# Patient Record
Sex: Male | Born: 1950 | Race: Black or African American | Hispanic: No | Marital: Single | Smoking: Never smoker
Health system: Southern US, Community
[De-identification: ages and names within clinical notes are randomized; demographics above are authoritative.]

## PROBLEM LIST (undated history)

## (undated) DIAGNOSIS — N400 Enlarged prostate without lower urinary tract symptoms: Secondary | ICD-10-CM

## (undated) DIAGNOSIS — K279 Peptic ulcer, site unspecified, unspecified as acute or chronic, without hemorrhage or perforation: Secondary | ICD-10-CM

## (undated) DIAGNOSIS — E11621 Type 2 diabetes mellitus with foot ulcer: Secondary | ICD-10-CM

## (undated) DIAGNOSIS — G459 Transient cerebral ischemic attack, unspecified: Secondary | ICD-10-CM

## (undated) DIAGNOSIS — E1169 Type 2 diabetes mellitus with other specified complication: Secondary | ICD-10-CM

## (undated) DIAGNOSIS — E2740 Unspecified adrenocortical insufficiency: Secondary | ICD-10-CM

## (undated) DIAGNOSIS — Z789 Other specified health status: Secondary | ICD-10-CM

## (undated) DIAGNOSIS — B9562 Methicillin resistant Staphylococcus aureus infection as the cause of diseases classified elsewhere: Secondary | ICD-10-CM

## (undated) DIAGNOSIS — I82409 Acute embolism and thrombosis of unspecified deep veins of unspecified lower extremity: Secondary | ICD-10-CM

## (undated) DIAGNOSIS — D509 Iron deficiency anemia, unspecified: Secondary | ICD-10-CM

## (undated) DIAGNOSIS — M549 Dorsalgia, unspecified: Secondary | ICD-10-CM

## (undated) DIAGNOSIS — M869 Osteomyelitis, unspecified: Secondary | ICD-10-CM

## (undated) DIAGNOSIS — L97509 Non-pressure chronic ulcer of other part of unspecified foot with unspecified severity: Secondary | ICD-10-CM

## (undated) DIAGNOSIS — Z89611 Acquired absence of right leg above knee: Secondary | ICD-10-CM

## (undated) DIAGNOSIS — G8929 Other chronic pain: Secondary | ICD-10-CM

## (undated) HISTORY — PX: BACK SURGERY: SHX140

---

## 2002-10-03 ENCOUNTER — Inpatient Hospital Stay (HOSPITAL_COMMUNITY): Admission: EM | Admit: 2002-10-03 | Discharge: 2002-10-05 | Payer: Self-pay | Admitting: Emergency Medicine

## 2002-10-05 ENCOUNTER — Encounter: Payer: Self-pay | Admitting: Internal Medicine

## 2005-03-03 ENCOUNTER — Emergency Department (HOSPITAL_COMMUNITY): Admission: EM | Admit: 2005-03-03 | Discharge: 2005-03-03 | Payer: Self-pay | Admitting: Emergency Medicine

## 2006-11-29 ENCOUNTER — Emergency Department (HOSPITAL_COMMUNITY): Admission: EM | Admit: 2006-11-29 | Discharge: 2006-11-29 | Payer: Self-pay | Admitting: Emergency Medicine

## 2006-11-30 ENCOUNTER — Emergency Department (HOSPITAL_COMMUNITY): Admission: EM | Admit: 2006-11-30 | Discharge: 2006-11-30 | Payer: Self-pay | Admitting: Emergency Medicine

## 2010-11-04 NOTE — Discharge Summary (Signed)
NAME:  Jeff Ferrell, JUNKER NO.:  [id number]   MEDICAL RECORD NO.:  [id number]                   PATIENT TYPE:  INP   LOCATION:  4704                                 FACILITY:  [facility]   PHYSICIAN:  Eliseo Gum, M.D.                DATE OF BIRTH:  Jul 18, 1950   DATE OF ADMISSION:  10/03/2002  DATE OF DISCHARGE:  10/05/2002                                 DISCHARGE SUMMARY   DISCHARGE DIAGNOSES:  1. Chest pain believed to be secondary to cocaine-induced vasospasm.  2. Alcohol abuse.  3. Cocaine abuse.   PAST MEDICAL HISTORY:  1. Reattachment of left finger.  2. Diabetes mellitus diagnosed eight years ago at [facility].   DISCHARGE MEDICATIONS:  1. Aspirin 81 mg q.d.  2. Multivitamin p.o. q.d.   CONSULTATIONS:  Cardiology.   PROCEDURE:  Cardiolite on October 05, 2002.   FOLLOW UP:  The patient will follow up in outpatient clinic.  At that time,  any new chest pain should be assessed and may be helpful to have a baseline  EKG at that time for future records.   HISTORY OF PRESENT ILLNESS:  This is a 60 year old, African-American male  with diabetes and alcohol abuse who presents to [facility] with left-sided  chest pain that started three days prior to admission.  Pain is 4/10 and  without radiation.  Initially there were no associated symptoms, however, on  day of arrival, the patient had palpitations, shortness of breath, nausea  and vomiting which continued on arrival to the hospital.  The patient also  had numbness of left hand and neck and his symptoms were relieved with  medications given in the emergency department.  However, he said that the  left-sided chest pain did not subside.  The pain was also in the left  shoulder.   ALLERGIES:  PENICILLIN.   MEDICATIONS:  None.   SOCIAL HISTORY:  The patient says that he has never smoked.  He says that he  drinks about three quarts of beer a day.  He denied cocaine and IV drug use,  although his  urine drug screen was positive for cocaine.  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  He  currently lives with a girlfriend here in [place].   FAMILY HISTORY:  Significant for mother age 51 with hypertension and father  age 66 with diabetes mellitus.  He has three siblings, one of which has  diabetes mellitus and two children which are healthy.   PHYSICAL EXAMINATION:  VITAL SIGNS:  Pulse 110, blood pressure 116/78,  temperature 97.8, respirations 20, O2 saturations 96% on 2 L.  GENERAL:  Well-developed, well-nourished, African-American male in no acute  distress.  HEENT:  Pupils equal round and reactive to light.  Oropharynx was clear.  Mucosa was moist.  NECK:  No JVD.  LUNGS:  Clear  to auscultation bilaterally.  CARDIAC:  Regular rate and rhythm without appreciable murmur, gallop or rub.  Pain on palpation over the left anterior chest.  ABDOMEN:  Belly was soft, nondistended.  There was no abdominal tenderness  to palpation.  Positive normoactive bowel wounds.  EXTREMITIES:  No lower extremity edema.   LABORATORY DATA AND X-RAY FINDINGS:  Sodium 137, potassium 4.2, chloride  105, bicarb 24, BUN 12, creatinine 0.7, glucose 119.  First set of cardiac  enzymes showed CK of 437 MB 10.6 and troponin of 0.01.  White count 4.6,  hemoglobin 16.2, platelets 260.   EKG showed flattening of T waves with ST changes.  No Q waves and normal  intervals.  The chest x-ray was normal.   HOSPITAL COURSE:  Problem 1.  CHEST PAIN:  As noted, the patient adamantly  denied use of cocaine or any other drugs.  Cardiac enzymes were checked x3  with results being CK 437, 300 and 233; MB fraction 10.6, 7.9 and 5.0; three  troponins of 0.01, 0.01 and less than 0.01.  D-dimer was also checked which  was less than 0.22.  For risk stratification, liver profile was checked  which showed an HDL of 77 and LDL of 44.  We did check patient's urine for  drugs,  although he denied any use and he was positive for cocaine.  We did  call cardiology, especially given his risk factor of diabetes mellitus, and  they did a treadmill Cardiolite which was negative.  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  They thought that the AV block was probably secondary to cocaine use  and beta-blocker.  The beta-blocker was stopped and the patient's AV block  resolved as well.  The patient was discharged home with instructions to  follow up in the outpatient clinic.  He was given aspirin on discharge.   Problem 2.  SUBSTANCE ABUSE INCLUDING ALCOHOL AND COCAINE:  The patient was  given thiamine and folate.  He was also advised of his alcohol abuse and his  cocaine abuse.  This will also something that the patient will need to  follow as an outpatient.   DISCHARGE LABORATORY DATA AND X-RAY FINDINGS:  White count 3.2, hemoglobin  14, platelets 178, MCV 89.2.  Sodium 134, potassium 3.5, chloride 103, CO2  25, glucose 163, BUN 9, creatinine 0.9, calcium 8.1.  AST 58, ALT 43, total  bilirubin 2, Alk phos 66.                                                Eliseo Gum, M.D.    KC/MEDQ  D:  10/22/2002  T:  10/23/2002  Job:  [id number]

## 2010-11-24 ENCOUNTER — Emergency Department (HOSPITAL_COMMUNITY): Payer: Self-pay

## 2010-11-24 ENCOUNTER — Emergency Department (HOSPITAL_COMMUNITY)
Admission: EM | Admit: 2010-11-24 | Discharge: 2010-11-24 | Disposition: A | Discharge status: Home / Self Care | Payer: Self-pay | Attending: Emergency Medicine | Admitting: Emergency Medicine

## 2010-11-24 DIAGNOSIS — M549 Dorsalgia, unspecified: Secondary | ICD-10-CM | POA: Insufficient documentation

## 2010-11-24 DIAGNOSIS — M545 Low back pain, unspecified: Secondary | ICD-10-CM | POA: Insufficient documentation

## 2010-11-24 DIAGNOSIS — L732 Hidradenitis suppurativa: Secondary | ICD-10-CM | POA: Insufficient documentation

## 2010-11-24 DIAGNOSIS — R000 Tachycardia, unspecified: Secondary | ICD-10-CM | POA: Insufficient documentation

## 2010-11-24 DIAGNOSIS — IMO0002 Reserved for concepts with insufficient information to code with codable children: Secondary | ICD-10-CM | POA: Insufficient documentation

## 2010-11-24 LAB — DIFFERENTIAL
Eosinophils Absolute: 0.1 10*3/uL (ref 0.0–0.7)
Lymphs Abs: 1.3 10*3/uL (ref 0.7–4.0)
Monocytes Absolute: 0.9 10*3/uL (ref 0.1–1.0)
Monocytes Relative: 8 % (ref 3–12)
Neutrophils Relative %: 81 % — ABNORMAL HIGH (ref 43–77)

## 2010-11-24 LAB — BASIC METABOLIC PANEL
GFR calc Af Amer: >60 mL/min (ref >60)
GFR calc non Af Amer: >60 mL/min (ref >60)
Glucose, Bld: 216 mg/dL — ABNORMAL HIGH (ref 70–99)
Potassium: 3.5 mEq/L (ref 3.5–5.1)
Sodium: 128 mEq/L — ABNORMAL LOW (ref 135–145)

## 2010-11-24 LAB — URINALYSIS, ROUTINE W REFLEX MICROSCOPIC
Bilirubin Urine: NEGATIVE
Nitrite: NEGATIVE
Specific Gravity, Urine: 1.015 (ref 1.005–1.030)
Urobilinogen, UA: 1 mg/dL (ref 0.0–1.0)
pH: 6 (ref 5.0–8.0)

## 2010-11-24 LAB — CBC
MCH: 29 pg (ref 26.0–34.0)
MCV: 85.8 fL (ref 78.0–100.0)
RBC: 5 MIL/uL (ref 4.22–5.81)
RDW: 12.8 % (ref 11.5–15.5)

## 2011-04-06 LAB — URINALYSIS, ROUTINE W REFLEX MICROSCOPIC
Glucose, UA: NEGATIVE
Hgb urine dipstick: NEGATIVE
Ketones, ur: NEGATIVE
Nitrite: POSITIVE — AB
Nitrite: POSITIVE — AB
Protein, ur: NEGATIVE
Specific Gravity, Urine: 1.01
Specific Gravity, Urine: 1.022
Urobilinogen, UA: 1
pH: 7.5

## 2011-04-06 LAB — I-STAT 8, (EC8 V) (CONVERTED LAB)
Acid-base deficit: 1
BUN: 5 — ABNORMAL LOW
Bicarbonate: 26.5 — ABNORMAL HIGH
Chloride: 108
HCT: 49
Operator id: 146091
pCO2, Ven: 51.5 — ABNORMAL HIGH
pH, Ven: 7.32 — ABNORMAL HIGH

## 2011-04-06 LAB — URINE MICROSCOPIC-ADD ON

## 2011-04-06 LAB — CBC
HCT: 42.2
MCHC: 33.9
MCHC: 34.2
MCV: 89.2
MCV: 89.2
RBC: 4.73
RDW: 12.9
WBC: 17.5 — ABNORMAL HIGH

## 2011-04-06 LAB — DIFFERENTIAL
Basophils Absolute: 0
Basophils Absolute: 0
Basophils Relative: 1
Eosinophils Absolute: 0.1
Lymphocytes Relative: 1 — ABNORMAL LOW
Lymphs Abs: 0.2 — ABNORMAL LOW
Neutro Abs: 17.1 — ABNORMAL HIGH
Neutro Abs: 2.3
Neutrophils Relative %: 58
Neutrophils Relative %: 98 — ABNORMAL HIGH

## 2011-04-06 LAB — COMPREHENSIVE METABOLIC PANEL
BUN: 5 — ABNORMAL LOW
CO2: 24
Calcium: 8.8
Chloride: 103
Creatinine, Ser: 0.83
GFR calc Af Amer: >60
GFR calc non Af Amer: >60
Total Bilirubin: 1.6 — ABNORMAL HIGH

## 2011-04-06 LAB — POCT I-STAT CREATININE: Creatinine, Ser: 0.8

## 2011-04-06 LAB — URINE CULTURE: Colony Count: NO GROWTH

## 2011-04-06 LAB — LIPASE, BLOOD: Lipase: 27

## 2011-05-01 ENCOUNTER — Emergency Department (HOSPITAL_COMMUNITY): Payer: Non-veteran care

## 2011-05-01 ENCOUNTER — Encounter: Payer: Self-pay | Admitting: *Deleted

## 2011-05-01 ENCOUNTER — Emergency Department (HOSPITAL_COMMUNITY)
Admission: EM | Admit: 2011-05-01 | Discharge: 2011-05-01 | Disposition: A | Discharge status: Home / Self Care | Payer: Non-veteran care | Attending: Emergency Medicine | Admitting: Emergency Medicine

## 2011-05-01 DIAGNOSIS — S39012A Strain of muscle, fascia and tendon of lower back, initial encounter: Secondary | ICD-10-CM

## 2011-05-01 DIAGNOSIS — X500XXA Overexertion from strenuous movement or load, initial encounter: Secondary | ICD-10-CM | POA: Insufficient documentation

## 2011-05-01 DIAGNOSIS — M5380 Other specified dorsopathies, site unspecified: Secondary | ICD-10-CM | POA: Insufficient documentation

## 2011-05-01 DIAGNOSIS — S335XXA Sprain of ligaments of lumbar spine, initial encounter: Secondary | ICD-10-CM | POA: Insufficient documentation

## 2011-05-01 DIAGNOSIS — Z981 Arthrodesis status: Secondary | ICD-10-CM | POA: Insufficient documentation

## 2011-05-01 MED ORDER — HYDROCODONE-ACETAMINOPHEN 5-325 MG PO TABS: 1.0000 | ORAL_TABLET | Freq: Four times a day (QID) | ORAL | Status: AC | PRN

## 2011-05-01 MED ORDER — OXYCODONE-ACETAMINOPHEN 5-325 MG PO TABS
1.0000 | ORAL_TABLET | Freq: Once | ORAL | Status: AC
  Administered 2011-05-01: 1 via ORAL
  Filled 2011-05-01: qty 1

## 2011-05-01 MED ORDER — CYCLOBENZAPRINE HCL 5 MG PO TABS: 5.0000 mg | ORAL_TABLET | Freq: Three times a day (TID) | ORAL | Status: AC | PRN

## 2011-05-01 MED ORDER — NAPROXEN 500 MG PO TABS: 500.0000 mg | ORAL_TABLET | Freq: Two times a day (BID) | ORAL | Status: AC

## 2011-05-01 NOTE — ED Notes (Signed)
Patient stable and being discharged home with gf.  Patient states understanding of paperwork.

## 2011-05-01 NOTE — ED Provider Notes (Signed)
History     CSN: [id number] Arrival date & time: 05/01/2011  2:50 PM   First MD Initiated Contact with Patient 05/01/11 1825      Chief Complaint  Patient presents with  . Tailbone Pain    (Consider location/radiation/quality/duration/timing/severity/associated sxs/prior treatment) HPI The patient states he slipped in the shower on Sunday evening. Patient states he did not actually fall but he twisted. He has history of chronic back pain and prior surgery. Patient states since that time he's been having a lot of pain. It does not get better with resting or movement. Patient states the pain is sharp and increases with certain positions. Patient states he's worried that he is a screw loose on the inside. Patient states at times the pain will go to his right leg. He denies any focal numbness or weakness. Is not any bowel or bladder incontinence. Past Medical History  Diagnosis Date  . Diabetes mellitus     regulated by diet    Past Surgical History  Procedure Date  . Back surgery     screws & hardware to lower back    No family history on file.  History  Substance Use Topics  . Smoking status: Never Smoker   . Smokeless tobacco: Not on file  . Alcohol Use: 3.6 oz/week    6 Cans of beer per week     daily      Review of Systems  All other systems reviewed and are negative.    Allergies  Penicillins  Home Medications  No current outpatient prescriptions on file.  BP 126/73  Pulse 97  Temp(Src) 97.6 F (36.4 C) (Oral)  Resp 20  Wt 180 lb (81.647 kg)  SpO2 96%  Physical Exam  Nursing note and vitals reviewed. Constitutional: He appears well-developed and well-nourished.  HENT:  Head: Normocephalic and atraumatic.  Right Ear: External ear normal.  Left Ear: External ear normal.  Nose: Nose normal.  Eyes: Conjunctivae and EOM are normal.  Neck: Neck supple. No tracheal deviation present.  Pulmonary/Chest: Effort normal. No stridor. No respiratory  distress.  Musculoskeletal: He exhibits no edema and no tenderness.       Lumbar back: He exhibits decreased range of motion, tenderness, pain and spasm. He exhibits no swelling and no edema.  Neurological: He is alert. He is not disoriented. No cranial nerve deficit or sensory deficit. He exhibits normal muscle tone. Coordination normal.  Skin: Skin is warm and dry. No rash noted. He is not diaphoretic. No erythema.  Psychiatric: He has a normal mood and affect. His behavior is normal. Thought content normal.    ED Course  Procedures (including critical care time)  Labs Reviewed - No data to display Dg Lumbar Spine Complete  05/01/2011  *RADIOLOGY REPORT*  Clinical Data: 60 year old male with fall, pain.  LUMBAR SPINE - COMPLETE 4+ VIEW  Comparison: 11/24/2010 and earlier.  Findings: Sequelae of posterior spinal fusion from L2 to the sacrum and medial iliac bones.  The hardware appears stable and intact. Spinal vertebral height and alignment appears stable.  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 There is osteopenia.  IMPRESSION: Stable postoperative appearance of the lumbar spine.  Original Report Authenticated By: Harley Hallmark, M.D.      MDM  Patient without signs of bony abnormality on the x-rays.  No sign of acute neurological or vascular emergency associated with pt's back pain.  May have a component of sciatica.  Safe for outpatient  follow up.   Diagnosis #1 lumbar strain       Celene Kras, MD 05/01/11 Ernestina Columbia

## 2011-05-01 NOTE — ED Notes (Signed)
Pt states "I slipped Saturday evening in the shower, I have screws & hardware in my lower back, it feels like there's a screw loose on the left side, pain goes into my right leg"

## 2011-05-01 NOTE — ED Notes (Signed)
Pt ambulated to and from xray with tech

## 2011-05-02 ENCOUNTER — Encounter (HOSPITAL_COMMUNITY): Payer: Self-pay | Admitting: Emergency Medicine

## 2015-02-05 ENCOUNTER — Encounter (HOSPITAL_COMMUNITY): Payer: Self-pay | Admitting: Emergency Medicine

## 2015-02-05 ENCOUNTER — Emergency Department (HOSPITAL_COMMUNITY): Payer: Medicare Other

## 2015-02-05 ENCOUNTER — Emergency Department (HOSPITAL_COMMUNITY)
Admission: EM | Admit: 2015-02-05 | Discharge: 2015-02-05 | Discharge status: Against Medical Advice | Payer: Medicare Other | Attending: Emergency Medicine | Admitting: Emergency Medicine

## 2015-02-05 DIAGNOSIS — G8929 Other chronic pain: Secondary | ICD-10-CM | POA: Diagnosis not present

## 2015-02-05 DIAGNOSIS — N390 Urinary tract infection, site not specified: Secondary | ICD-10-CM | POA: Diagnosis not present

## 2015-02-05 DIAGNOSIS — E162 Hypoglycemia, unspecified: Secondary | ICD-10-CM

## 2015-02-05 DIAGNOSIS — Z794 Long term (current) use of insulin: Secondary | ICD-10-CM | POA: Diagnosis not present

## 2015-02-05 DIAGNOSIS — Z880 Allergy status to penicillin: Secondary | ICD-10-CM | POA: Insufficient documentation

## 2015-02-05 DIAGNOSIS — R945 Abnormal results of liver function studies: Secondary | ICD-10-CM | POA: Insufficient documentation

## 2015-02-05 DIAGNOSIS — E11649 Type 2 diabetes mellitus with hypoglycemia without coma: Secondary | ICD-10-CM | POA: Insufficient documentation

## 2015-02-05 DIAGNOSIS — R7989 Other specified abnormal findings of blood chemistry: Secondary | ICD-10-CM

## 2015-02-05 DIAGNOSIS — R000 Tachycardia, unspecified: Secondary | ICD-10-CM | POA: Diagnosis not present

## 2015-02-05 DIAGNOSIS — Z79899 Other long term (current) drug therapy: Secondary | ICD-10-CM | POA: Insufficient documentation

## 2015-02-05 HISTORY — DX: Dorsalgia, unspecified: M54.9

## 2015-02-05 HISTORY — DX: Other chronic pain: G89.29

## 2015-02-05 LAB — CBC WITH DIFFERENTIAL/PLATELET
BASOS ABS: 0 10*3/uL (ref 0.0–0.1)
BASOS PCT: 0 % (ref 0–1)
Eosinophils Absolute: 0 10*3/uL (ref 0.0–0.7)
Eosinophils Relative: 0 % (ref 0–5)
HEMATOCRIT: 41.2 % (ref 39.0–52.0)
Hemoglobin: 14.3 g/dL (ref 13.0–17.0)
LYMPHS PCT: 7 % — AB (ref 12–46)
Lymphs Abs: 0.6 10*3/uL — ABNORMAL LOW (ref 0.7–4.0)
MCH: 32.4 pg (ref 26.0–34.0)
MCHC: 34.7 g/dL (ref 30.0–36.0)
MCV: 93.2 fL (ref 78.0–100.0)
Monocytes Absolute: 1.1 10*3/uL — ABNORMAL HIGH (ref 0.1–1.0)
Monocytes Relative: 13 % — ABNORMAL HIGH (ref 3–12)
NEUTROS ABS: 6.9 10*3/uL (ref 1.7–7.7)
Neutrophils Relative %: 80 % — ABNORMAL HIGH (ref 43–77)
PLATELETS: 217 10*3/uL (ref 150–400)
RBC: 4.42 MIL/uL (ref 4.22–5.81)
RDW: 13.1 % (ref 11.5–15.5)
WBC: 8.6 10*3/uL (ref 4.0–10.5)

## 2015-02-05 LAB — COMPREHENSIVE METABOLIC PANEL WITH GFR
ALT: 33 U/L (ref 17–63)
AST: 85 U/L — ABNORMAL HIGH (ref 15–41)
Albumin: 3.5 g/dL (ref 3.5–5.0)
Alkaline Phosphatase: 157 U/L — ABNORMAL HIGH (ref 38–126)
Anion gap: 8 (ref 5–15)
BUN: 6 mg/dL (ref 6–20)
CO2: 24 mmol/L (ref 22–32)
Calcium: 8.6 mg/dL — ABNORMAL LOW (ref 8.9–10.3)
Chloride: 102 mmol/L (ref 101–111)
Creatinine, Ser: 0.55 mg/dL — ABNORMAL LOW (ref 0.61–1.24)
GFR calc Af Amer: >60 mL/min
GFR calc non Af Amer: >60 mL/min
Glucose, Bld: 100 mg/dL — ABNORMAL HIGH (ref 65–99)
Potassium: 4.6 mmol/L (ref 3.5–5.1)
Sodium: 134 mmol/L — ABNORMAL LOW (ref 135–145)
Total Bilirubin: 2.4 mg/dL — ABNORMAL HIGH (ref 0.3–1.2)
Total Protein: 7.8 g/dL (ref 6.5–8.1)

## 2015-02-05 LAB — URINALYSIS, ROUTINE W REFLEX MICROSCOPIC
BILIRUBIN URINE: NEGATIVE
Glucose, UA: 250 mg/dL — AB
KETONES UR: NEGATIVE mg/dL
NITRITE: NEGATIVE
PH: 6 (ref 5.0–8.0)
Protein, ur: NEGATIVE mg/dL
SPECIFIC GRAVITY, URINE: 1.015 (ref 1.005–1.030)
UROBILINOGEN UA: 4 mg/dL — AB (ref 0.0–1.0)

## 2015-02-05 LAB — URINE MICROSCOPIC-ADD ON

## 2015-02-05 LAB — CBG MONITORING, ED
GLUCOSE-CAPILLARY: 126 mg/dL — AB (ref 65–99)
Glucose-Capillary: 125 mg/dL — ABNORMAL HIGH (ref 65–99)
Glucose-Capillary: 79 mg/dL (ref 65–99)

## 2015-02-05 MED ORDER — CIPROFLOXACIN HCL 500 MG PO TABS: 500.0000 mg | ORAL_TABLET | Freq: Two times a day (BID) | ORAL | Status: DC

## 2015-02-05 MED ORDER — NITROFURANTOIN MONOHYD MACRO 100 MG PO CAPS: 100.0000 mg | ORAL_CAPSULE | Freq: Two times a day (BID) | ORAL | Status: DC

## 2015-02-05 NOTE — ED Notes (Signed)
MD informed about patient HR 120-130s. Patient refused to stay and be admitted. Stated that he would come back to the ED if things change.

## 2015-02-05 NOTE — Discharge Instructions (Signed)
°Emergency Department Resource Guide °1) Find a Doctor and Pay Out of Pocket °Although you won't have to find out who is covered by your insurance plan, it is a good idea to ask around and get recommendations. You will then need to call the office and see if the doctor you have chosen will accept you as a new patient and what types of options they offer for patients who are self-pay. Some doctors offer discounts or will set up payment plans for their patients who do not have insurance, but you will need to ask so you aren't surprised when you get to your appointment. ° °2) Contact Your Local Health Department °Not all health departments have doctors that can see patients for sick visits, but many do, so it is worth a call to see if yours does. If you don't know where your local health department is, you can check in your phone book. The CDC also has a tool to help you locate your state's health department, and many state websites also have listings of all of their local health departments. ° °3) Find a Walk-in Clinic °If your illness is not likely to be very severe or complicated, you may want to try a walk in clinic. These are popping up all over the country in pharmacies, drugstores, and shopping centers. They're usually staffed by nurse practitioners or physician assistants that have been trained to treat common illnesses and complaints. They're usually fairly quick and inexpensive. However, if you have serious medical issues or chronic medical problems, these are probably not your best option. ° °No Primary Care Doctor: °- Call Health Connect at  832-8000 - they can help you locate a primary care doctor that  accepts your insurance, provides certain services, etc. °- Physician Referral Service- 1-800-533-3463 ° °Chronic Pain Problems: °Organization         Address  Phone   Notes  °[facility]  (336) 297-2271 Patients need to be referred by their primary care doctor.  ° °Medication  Assistance: °Organization         Address  Phone   Notes  °[place] [facility] [address] °[place], [place] [address] (336) 641-8030 --Must be a resident of [place] °-- Must have NO insurance coverage whatsoever (no Medicaid/ Medicare, etc.) °-- The pt. MUST have a primary care doctor that directs their care regularly and follows them in the community °  °[facility]  (866) 331-1348   °[employer]  (888) 892-1162   ° °Agencies that provide inexpensive medical care: °Organization         Address  Phone   Notes  °[facility]  (336) 832-8035   °[facility]    (336) 832-7272   °[facility] [address] °[place], [place] [address] (336) 832-4777   °[facility] [address], °[place] (336) 271-4999   °[facility]    (336) 373-0678   °[facility]    (336) 272-1050   °[facility] ° [address], [place] Phone:  (336) 832-4444, Fax:  (336) 832-4440 Hours of Operation:  9 am - 6 pm, M-F.  Also accepts Medicaid/Medicare and self-pay.  °[facility] ° [address], [place] Phone: (336) 832-3150, Fax: (336) [phone]. Hours of Operation:  8:30 am - 5:30 pm, M-F.  Also accepts Medicaid and self-pay.  °[facility] [place] [address], [place] Phone: (336) 878-6027   °[facility] [address], [place], [place] (336)723-1848, Ext. 123 Mondays & Thursdays: 7-9 AM.  First 15 patients are seen on a first come, first serve basis. °  ° °Medicaid-accepting [place] Providers: ° °Organization         Address  Phone   Notes  °[facility] [address], [place] (336) 641-2100 Also accepts self-pay patients.  °[facility] [address], [place] ° (336) 856-9996   °[facility] [address], [place]  (336) 288-8857   °[facility] [address] [place] [address], [place] (336) 299-7000   °Veita Bland [address], [place]  ° (336) 373-1557 Only accepts [place] Access Medicaid patients after they have their name applied to their card.  ° °Self-Pay (no insurance) in [place]: ° °Organization         Address  Phone   Notes  °Sickle Cell Patients, [facility] [address], [place] (336) 832-1970   °[facility] [address], [place] (336) 832-4400   °[facility] ° [address], [place] (336) 992-4800   °[facility]/Dr. Osei-Bonsu ° [address] [place] [address], [place] or [address], [place] (336) [phone] Phone number for both [place] and [place] locations is the same.  °[facility] [address], [place] (336) 299-0000   °[facility] [address] [place] [address], [place] or [address] (336) 852-7530 °(336) 878-2260   °[facility] [address], [place] (336) 350-1642, phone; (336) 294-5005, fax Sees patients 1st and 3rd Saturday of every month.  Must not qualify for public or private insurance (i.e. Medicaid, Medicare, [place] Health Choice, Veterans' Benefits) • Household income should be no more than 200% of the poverty level •The clinic cannot treat you if you are pregnant or think you are pregnant • Sexually transmitted diseases are not treated at the clinic.  ° ° °Dental Care: °Organization         Address  Phone  Notes  °[place] [facility] [address], [place] (336) 641-6152 Accepts children up to age 21 who are enrolled in Medicaid or [place] Health Choice; pregnant women with a Medicaid card; and children who have applied for Medicaid or [place] Health Choice, but were declined, whose parents can pay a reduced fee at time of service.  °[place]  [facility] [place]  [address], [place] (336) 641-7733 Accepts children up to age 21 who are enrolled in Medicaid or [place] Health Choice; pregnant women with a Medicaid card; and children who have applied for Medicaid or [place] Health Choice, but were declined, whose parents can pay a reduced fee at time of service.  °[facility] ° [address], [place] (336) 641-4533 Patients are seen by appointment only. Walk-ins are not accepted. [facility] will see patients 18 years of age and older. °Monday - Tuesday (8am-5pm) °Most Wednesdays (8:30-5pm) °$30 per visit, cash only  °[facility] ° [address], [place] (336) 641-4533 Patients are seen by appointment only. Walk-ins are not accepted. [facility] will see patients 18 years of age and older. °One   Wednesday Evening (Monthly: Volunteer Based).  $30 per visit, cash only  °[facility]  (919) 537-3737 for adults; Children under age 4, call Graduate Pediatric Dentistry at ([phone]) [phone]. Children aged 4-14, please call (919) [phone] to request a pediatric application. ° Dental services are provided in all areas of dental care including fillings, crowns and bridges, complete and partial dentures, implants, gum treatment, root canals, and extractions. Preventive care is also provided. Treatment is provided to both adults and children. °Patients are selected via a lottery and there is often a waiting list. °  °[facility] [address], °[place] ° (336) 763-8833 [email or web address] °  °[facility] [address], [place], [place] (336)723-1848, Ext. 123 Second and Fourth Thursday of each month, opens at 6:30 AM; Clinic ends at 9 AM.  Patients are seen on a first-come first-served basis, and a limited number are seen during each clinic.  ° °[facility] ° [address], [place], [place] (336) 723-7904    Eligibility Requirements °You must have lived in [place], [place], or [place] counties for at least the last three months. °  You cannot be eligible for state or federal sponsored healthcare insurance, including Veterans Administration, Medicaid, or Medicare. °  You generally cannot be eligible for healthcare insurance through your employer.  °  How to apply: °Eligibility screenings are held every Tuesday and Wednesday afternoon from 1:00 pm until 4:00 pm. You do not need an appointment for the interview!  °[facility] [address], [place], [place] 336-631-2330   °[facility]  336-342-8273   °[facility]  336-703-3100   °[facility]  336-570-6415   ° °Behavioral Health Resources in the Community: °Intensive Outpatient Programs °Organization         Address  Phone  Notes  °[facility] [address], [place], [place] 336-878-6098   °[facility] [address], [place], [place] 336-832-9800   °[facility] [address], [place], [place] ° 336-882-2125   °[facility] [address],  °[place], [place] 1-800-853-5163 or 336-641-4981   °Substance Abuse Resources °Organization         Address  Phone  Notes  °[facility]  336-882-2125   °[facility]  336-784-9470   °[facility]  336-285-9073   °[facility]  336-845-3988   °Residential & Outpatient Substance Abuse Program  1-800-659-3381   °Psychological Services °Organization         Address  Phone  Notes  °[facility]  336- 832-9600   °[facility]  336- 378-7881   °[facility] [address], [place] 1-800-853-5163 or 336-641-4981   ° °Mobile Crisis Teams °Organization         Address  Phone  Notes  °[facility], Mobile Crisis Care Unit  1-877-626-1772   °Assertive °[facility] ° [address]  [place], [place] 336-834-9664   °Sharon DeEsch [address] °[place] 336-554-5454   ° °Self-Help/Support Groups °Organization         Address  Phone             Notes  °[facility]  - variety of support groups  336- 373-1402 Call for more information  °Narcotics Anonymous (NA), [facility] [address], °[place]  2 meetings at this location  ° °  Residential Treatment Programs Organization         Address  Phone  Notes  [facility] [address],    [place]  1-610-960-4540   [facility]  [address] [place] [address], [place], [place] 478-295-6213   [facility] [address] [place], [place] 086-578-4696 Admissions: 8am-3pm M-F  [facility] [address]    [place], [place] 295-284-1324   [facility] [address] [place], [place], [place] 401-027-2536   [facility] [address]  [place], [place] 644-034-7425   [facility] - Intensive Outpatient [address], [place], [place] 956-387-5643   [facility] ([facility] [address]  [place], [place] 3-295-188-4166 or 947-850-0536   [facility] ([facility] [address], [place], [place] 323-557-3220 Accepts Medicaid  [facility] [address]  [place] 2-542-706-2376 Substance Abuse/Addiction Treatment   [facility] Organization         Address  Phone  Notes  [facility]  701-186-1026   Angie Fava, PhD [address] [place], [place]   (508)461-0254 or 308-331-3371   [facility]   [address] [place], [place] (380)510-7866   [facility] [address] [place], [place] (985)434-8936 Insurance/Medicaid/sponsorship through [facility] and Families [address]                                    [place], [place] (703) 624-9238 Therapy/tele-psych/case    [facility] [address] [place] [address], [place] 540-738-1672    Dr. Lolly Mustache  351-098-5483   [facility] [place] [facility]  [employer] [facility] Dept. 1) [address] [place] 2) [address] [place] 3)  [address], [place] (317)886-2987 (250)730-2874  (289)586-3316   [facility] Child Abuse Hotline (318)783-2604 or 608 719 6546 (After Hours)      Take the prescription as directed.  Call your regular medical doctor today to schedule a follow up appointment within the next 3 days.  Return to the Emergency Department immediately sooner if worsening, or you change your mind regarding further evaluation and possible admission for your elevated heart rate.

## 2015-02-05 NOTE — ED Provider Notes (Signed)
CSN: [id number]     Arrival date & time 02/05/15  1020 History   First MD Initiated Contact with Patient 02/05/15 1043     Chief Complaint  Patient presents with  . Hypoglycemia      HPI Pt was seen at 1105. Per EMS, friends, and pt report: Pt c/o unknown onset and resolution of one episode of "unresponsiveness" that was noticed this morning PTA. Pt's friends state they came to pt's house to pick him up and were "banging on his door."  When pt did not come to the door, his friends "broke down the door" and found pt on his bed "unresponsive" with "snoring" respirations. EMS noted pt's CBG was "38" on their arrival to scene. Pt was given IV D50 with improvement in mental status. No reported apnea or pulselessness. Pt states his CBG's usually run "in the 200's." Pt's LD insulin and "diabetes pills" was yesterday morning. Denies any complaints currently.    Past Medical History  Diagnosis Date  . Diabetes mellitus     regulated by diet  . Chronic back pain    Past Surgical History  Procedure Laterality Date  . Back surgery      screws & hardware to lower back    Social History  Substance Use Topics  . Smoking status: Never Smoker   . Smokeless tobacco: None  . Alcohol Use: 3.6 oz/week    6 Cans of beer per week     Comment: daily    Review of Systems ROS: Statement: All systems negative except as marked or noted in the HPI; Constitutional: Negative for fever and chills. ; ; Eyes: Negative for eye pain, redness and discharge. ; ; ENMT: Negative for ear pain, hoarseness, nasal congestion, sinus pressure and sore throat. ; ; Cardiovascular: Negative for chest pain, palpitations, diaphoresis, dyspnea and peripheral edema. ; ; Respiratory: Negative for cough, wheezing and stridor. ; ; Gastrointestinal: Negative for nausea, vomiting, diarrhea, abdominal pain, blood in stool, hematemesis, jaundice and rectal bleeding. . ; ; Genitourinary: Negative for dysuria, flank pain and hematuria. ; ;  Musculoskeletal: Negative for back pain and neck pain. Negative for swelling and trauma.; ; Skin: Negative for pruritus, rash, abrasions, blisters, bruising and skin lesion.; ; Neuro: +"found unresponsive." Negative for headache, lightheadedness and neck stiffness. Negative for extremity weakness, paresthesias.      Allergies  Penicillins  Home Medications   Prior to Admission medications   Medication Sig Start Date End Date Taking? Authorizing Provider  cyclobenzaprine (FLEXERIL) 10 MG tablet Take 5 mg by mouth at bedtime.    Yes Historical Provider, MD  dicyclomine (BENTYL) 10 MG capsule Take 10 mg by mouth 3 (three) times daily as needed for spasms.    Yes Historical Provider, MD  finasteride (PROSCAR) 5 MG tablet Take 5 mg by mouth daily.   Yes Historical Provider, MD  glipiZIDE (GLUCOTROL) 10 MG tablet Take 10 mg by mouth 2 (two) times daily before a meal.    Yes Historical Provider, MD  insulin aspart protamine- aspart (NOVOLOG MIX 70/30) (70-30) 100 UNIT/ML injection Inject 22-44 Units into the skin 2 (two) times daily with a meal. Takes 44 units with breakfast and 22 units with supper   Yes Historical Provider, MD  tamsulosin (FLOMAX) 0.4 MG CAPS capsule Take 0.4 mg by mouth every evening.    Yes Historical Provider, MD  Vitamin D, Ergocalciferol, (DRISDOL) 50000 UNITS CAPS capsule Take 50,000 Units by mouth every Sunday.    Yes Historical Provider,  MD   BP 131/72 mmHg  Pulse 132  Temp(Src) 98.6 F (37 C) (Oral)  Resp 18  SpO2 96% Physical Exam  1110: Physical examination:  Nursing notes reviewed; Vital signs and O2 SAT reviewed;  Constitutional: Well developed, Well nourished, Well hydrated, In no acute distress; Head:  Normocephalic, atraumatic; Eyes: EOMI, PERRL, No scleral icterus; ENMT: Mouth and pharynx normal, Mucous membranes moist; Neck: Supple, Full range of motion, No lymphadenopathy; Cardiovascular: Tachycardic rate and rhythm, No gallop; Respiratory: Breath sounds  clear & equal bilaterally, No wheezes.  Speaking full sentences with ease, Normal respiratory effort/excursion; Chest: Nontender, Movement normal; Abdomen: Soft, Nontender, Nondistended, Normal bowel sounds; Genitourinary: No CVA tenderness; Extremities: Pulses normal, No tenderness, No edema, No calf edema or asymmetry.; Neuro: AA&Ox3, Major CN grossly intact. No facial droop. Speech clear. Moves all extremities on stretcher spontaneously without apparent gross focal motor deficits.; Skin: Color normal, Warm, Dry.    ED Course  Procedures (including critical care time) Labs Review  Imaging Review  I have personally reviewed and evaluated these images and lab results as part of my medical decision-making.    MDM  MDM Reviewed: previous chart, nursing note and vitals Reviewed previous: labs Interpretation: labs     Results for orders placed or performed during the hospital encounter of 02/05/15  Comprehensive metabolic panel  Result Value Ref Range   Sodium 134 (L) 135 - 145 mmol/L   Potassium 4.6 3.5 - 5.1 mmol/L   Chloride 102 101 - 111 mmol/L   CO2 24 22 - 32 mmol/L   Glucose, Bld 100 (H) 65 - 99 mg/dL   BUN 6 6 - 20 mg/dL   Creatinine, Ser 1.30 (L) 0.61 - 1.24 mg/dL   Calcium 8.6 (L) 8.9 - 10.3 mg/dL   Total Protein 7.8 6.5 - 8.1 g/dL   Albumin 3.5 3.5 - 5.0 g/dL   AST 85 (H) 15 - 41 U/L   ALT 33 17 - 63 U/L   Alkaline Phosphatase 157 (H) 38 - 126 U/L   Total Bilirubin 2.4 (H) 0.3 - 1.2 mg/dL   GFR calc non Af Amer >60 >60 mL/min   GFR calc Af Amer >60 >60 mL/min   Anion gap 8 5 - 15  CBC with Differential  Result Value Ref Range   WBC 8.6 4.0 - 10.5 K/uL   RBC 4.42 4.22 - 5.81 MIL/uL   Hemoglobin 14.3 13.0 - 17.0 g/dL   HCT 86.5 78.4 - 69.6 %   MCV 93.2 78.0 - 100.0 fL   MCH 32.4 26.0 - 34.0 pg   MCHC 34.7 30.0 - 36.0 g/dL   RDW 29.5 28.4 - 13.2 %   Platelets 217 150 - 400 K/uL   Neutrophils Relative % 80 (H) 43 - 77 %   Neutro Abs 6.9 1.7 - 7.7 K/uL    Lymphocytes Relative 7 (L) 12 - 46 %   Lymphs Abs 0.6 (L) 0.7 - 4.0 K/uL   Monocytes Relative 13 (H) 3 - 12 %   Monocytes Absolute 1.1 (H) 0.1 - 1.0 K/uL   Eosinophils Relative 0 0 - 5 %   Eosinophils Absolute 0.0 0.0 - 0.7 K/uL   Basophils Relative 0 0 - 1 %   Basophils Absolute 0.0 0.0 - 0.1 K/uL  Urinalysis, Routine w reflex microscopic (not at [facility])  Result Value Ref Range   Color, Urine AMBER (A) YELLOW   APPearance CLOUDY (A) CLEAR   Specific Gravity, Urine 1.015 1.005 - 1.030  pH 6.0 5.0 - 8.0   Glucose, UA 250 (A) NEGATIVE mg/dL   Hgb urine dipstick MODERATE (A) NEGATIVE   Bilirubin Urine NEGATIVE NEGATIVE   Ketones, ur NEGATIVE NEGATIVE mg/dL   Protein, ur NEGATIVE NEGATIVE mg/dL   Urobilinogen, UA 4.0 (H) 0.0 - 1.0 mg/dL   Nitrite NEGATIVE NEGATIVE   Leukocytes, UA MODERATE (A) NEGATIVE  Urine microscopic-add on  Result Value Ref Range   Squamous Epithelial / LPF RARE RARE   WBC, UA 21-50 <3 WBC/hpf   RBC / HPF 7-10 <3 RBC/hpf   Bacteria, UA MANY (A) RARE   Urine-Other MUCOUS PRESENT   CBG monitoring, ED  Result Value Ref Range   Glucose-Capillary 125 (H) 65 - 99 mg/dL  CBG monitoring, ED  Result Value Ref Range   Glucose-Capillary 79 65 - 99 mg/dL   Comment 1 Notify RN   POC CBG, ED  Result Value Ref Range   Glucose-Capillary 126 (H) 65 - 99 mg/dL   US Abdomen Complete 1/61/0960   CLINICAL DATA:  Elevated LFTs.  EXAM: ULTRASOUND ABDOMEN COMPLETE  COMPARISON:  The CT 11/29/2006  FINDINGS: Gallbladder: No gallstones or wall thickening visualized. No sonographic Murphy sign noted.  Common bile duct: Diameter: 0.3 cm.  Liver: Liver has increased echogenicity and poor definition of the internal architecture. Findings are most compatible with hepatic steatosis. No focal liver lesion.  IVC: Poorly visualized.  Pancreas: Not visualized.  Spleen: Size and appearance within normal limits.  Right Kidney: Length: 11.5 cm. Echogenicity within normal limits. No mass or  hydronephrosis visualized.  Left Kidney: Length: 10.5 cm. Echogenicity within normal limits. No mass or hydronephrosis visualized.  Abdominal aorta: Not visualized.  Other findings: None.  IMPRESSION: Liver is diffusely echogenic and poor visualization of the internal architecture. Findings are suggestive for hepatic steatosis.  Negative for gallstones.  No biliary dilatation.   Electronically Signed   By: Richarda Overlie M.D.   On: 02/05/2015 13:54    1510:  LFT's elevated near baseline and [place] without acute findings. Pt has tol PO well without N/V while in the ED. CBG stable 24+ hours after LD glucotrol. +UTI, UC pending; will tx macrobid. IV NS 1L given for elevated HR without much improvement. Pt refuses to stay in the ED any longer for any other treatment/testing and "just wants to get out of here right now."  Pt has gotten himself dressed, taken off his monitor leads, and has called for his ride home. Pt informed re: elevated HR and that I recommend further evaluation and possible admission.  Pt refuses to stay.  ED RN and I encouraged pt to stay, continues to refuse.  Pt makes his own medical decisions.  Risks of AMA explained to pt, including, but not limited to:  stroke, heart attack, cardiac arrythmia ("irregular heart rate/beat"), "passing out," temporary and/or permanent disability, death.  Pt verb understanding and continues to refuse further dx testing/treatment/possible admission, understanding the consequences of his decision.  I encouraged pt to follow up with his PMD tomorrow and return to the ED immediately if he changes his mind, symptoms return, or for any other concerns.  Pt verb understanding, agreeable.   Samuel Jester, DO 02/07/15 1444

## 2015-02-05 NOTE — ED Notes (Signed)
Bed: ZO10 Expected date:  Expected time:  Means of arrival:  Comments: EMS Hypoglycemic

## 2015-02-05 NOTE — ED Notes (Signed)
Patient here from home with c/o of hypoglycemia. Friend reports finding patient unresponsive and "snoring". CBG 38. 25g D50 given . Patient states that he has never been hypoglycemic before, "usually run high".

## 2015-02-07 LAB — URINE CULTURE

## 2015-02-09 ENCOUNTER — Telehealth (HOSPITAL_COMMUNITY): Payer: Self-pay

## 2015-02-09 NOTE — Telephone Encounter (Signed)
Post ED Visit - Positive Culture Follow-up  Culture report reviewed by antimicrobial stewardship pharmacist:  Wes Dulaney, Pharm.D., BCPS  Celedonio Miyamoto, Pharm.D., BCPS  Georgina Pillion, [address].D., BCPS  [place], [address].D., BCPS, AAHIVP  Estella Husk, Pharm.D., BCPS, AAHIVP  Elder Cyphers, [address].D., BCPS  Positive Urine culture, >/= 100,000 colonies -> Klebsiella Oxytoca Treated with Ciprofloxacin, organism sensitive to the same and no further patient follow-up is required at this time.  Arvid Right 02/09/2015, 5:06 AM

## 2015-03-29 ENCOUNTER — Emergency Department (HOSPITAL_COMMUNITY): Payer: Medicare Other

## 2015-03-29 ENCOUNTER — Inpatient Hospital Stay (HOSPITAL_COMMUNITY)
Admission: EM | Admit: 2015-03-29 | Discharge: 2015-04-01 | DRG: 617 | Disposition: A | Discharge status: Home Health | Payer: Medicare Other | Attending: Internal Medicine | Admitting: Internal Medicine

## 2015-03-29 ENCOUNTER — Encounter (HOSPITAL_COMMUNITY): Payer: Self-pay | Admitting: Emergency Medicine

## 2015-03-29 DIAGNOSIS — E1169 Type 2 diabetes mellitus with other specified complication: Secondary | ICD-10-CM

## 2015-03-29 DIAGNOSIS — E11621 Type 2 diabetes mellitus with foot ulcer: Secondary | ICD-10-CM | POA: Diagnosis not present

## 2015-03-29 DIAGNOSIS — Z6827 Body mass index (BMI) 27.0-27.9, adult: Secondary | ICD-10-CM

## 2015-03-29 DIAGNOSIS — I10 Essential (primary) hypertension: Secondary | ICD-10-CM | POA: Diagnosis present

## 2015-03-29 DIAGNOSIS — Z880 Allergy status to penicillin: Secondary | ICD-10-CM | POA: Diagnosis not present

## 2015-03-29 DIAGNOSIS — L97529 Non-pressure chronic ulcer of other part of left foot with unspecified severity: Secondary | ICD-10-CM | POA: Diagnosis not present

## 2015-03-29 DIAGNOSIS — L600 Ingrowing nail: Secondary | ICD-10-CM | POA: Diagnosis present

## 2015-03-29 DIAGNOSIS — G8929 Other chronic pain: Secondary | ICD-10-CM | POA: Diagnosis present

## 2015-03-29 DIAGNOSIS — Z794 Long term (current) use of insulin: Secondary | ICD-10-CM

## 2015-03-29 DIAGNOSIS — M869 Osteomyelitis, unspecified: Secondary | ICD-10-CM | POA: Diagnosis not present

## 2015-03-29 DIAGNOSIS — L089 Local infection of the skin and subcutaneous tissue, unspecified: Secondary | ICD-10-CM | POA: Diagnosis not present

## 2015-03-29 DIAGNOSIS — R000 Tachycardia, unspecified: Secondary | ICD-10-CM | POA: Diagnosis present

## 2015-03-29 DIAGNOSIS — E440 Moderate protein-calorie malnutrition: Secondary | ICD-10-CM | POA: Insufficient documentation

## 2015-03-29 DIAGNOSIS — M549 Dorsalgia, unspecified: Secondary | ICD-10-CM | POA: Diagnosis present

## 2015-03-29 DIAGNOSIS — E1151 Type 2 diabetes mellitus with diabetic peripheral angiopathy without gangrene: Secondary | ICD-10-CM | POA: Diagnosis present

## 2015-03-29 DIAGNOSIS — I959 Hypotension, unspecified: Secondary | ICD-10-CM | POA: Diagnosis present

## 2015-03-29 DIAGNOSIS — N400 Enlarged prostate without lower urinary tract symptoms: Secondary | ICD-10-CM | POA: Diagnosis present

## 2015-03-29 DIAGNOSIS — L97509 Non-pressure chronic ulcer of other part of unspecified foot with unspecified severity: Secondary | ICD-10-CM | POA: Diagnosis present

## 2015-03-29 DIAGNOSIS — M86172 Other acute osteomyelitis, left ankle and foot: Secondary | ICD-10-CM | POA: Diagnosis present

## 2015-03-29 DIAGNOSIS — Z79899 Other long term (current) drug therapy: Secondary | ICD-10-CM | POA: Diagnosis not present

## 2015-03-29 DIAGNOSIS — E119 Type 2 diabetes mellitus without complications: Secondary | ICD-10-CM

## 2015-03-29 DIAGNOSIS — E08621 Diabetes mellitus due to underlying condition with foot ulcer: Secondary | ICD-10-CM | POA: Diagnosis not present

## 2015-03-29 HISTORY — DX: Type 2 diabetes mellitus with foot ulcer: L97.509

## 2015-03-29 HISTORY — DX: Osteomyelitis, unspecified: M86.9

## 2015-03-29 HISTORY — DX: Type 2 diabetes mellitus with foot ulcer: E11.621

## 2015-03-29 HISTORY — DX: Type 2 diabetes mellitus with other specified complication: E11.69

## 2015-03-29 LAB — URINALYSIS, ROUTINE W REFLEX MICROSCOPIC
BILIRUBIN URINE: NEGATIVE
Glucose, UA: NEGATIVE mg/dL
Hgb urine dipstick: NEGATIVE
Ketones, ur: NEGATIVE mg/dL
NITRITE: NEGATIVE
Protein, ur: NEGATIVE mg/dL
SPECIFIC GRAVITY, URINE: 1.018 (ref 1.005–1.030)
UROBILINOGEN UA: 0.2 mg/dL (ref 0.0–1.0)
pH: 6 (ref 5.0–8.0)

## 2015-03-29 LAB — CBC
HEMATOCRIT: 38.3 % — AB (ref 39.0–52.0)
HEMOGLOBIN: 13.2 g/dL (ref 13.0–17.0)
MCH: 31.4 pg (ref 26.0–34.0)
MCHC: 34.5 g/dL (ref 30.0–36.0)
MCV: 91 fL (ref 78.0–100.0)
Platelets: 253 10*3/uL (ref 150–400)
RBC: 4.21 MIL/uL — AB (ref 4.22–5.81)
RDW: 12.1 % (ref 11.5–15.5)
WBC: 7.1 10*3/uL (ref 4.0–10.5)

## 2015-03-29 LAB — BASIC METABOLIC PANEL
ANION GAP: 11 (ref 5–15)
BUN: 7 mg/dL (ref 6–20)
CO2: 23 mmol/L (ref 22–32)
Calcium: 8.9 mg/dL (ref 8.9–10.3)
Chloride: 100 mmol/L — ABNORMAL LOW (ref 101–111)
Creatinine, Ser: 0.65 mg/dL (ref 0.61–1.24)
Glucose, Bld: 254 mg/dL — ABNORMAL HIGH (ref 65–99)
POTASSIUM: 4.2 mmol/L (ref 3.5–5.1)
SODIUM: 134 mmol/L — AB (ref 135–145)

## 2015-03-29 LAB — GLUCOSE, CAPILLARY
GLUCOSE-CAPILLARY: 157 mg/dL — AB (ref 65–99)
GLUCOSE-CAPILLARY: 119 mg/dL — AB (ref 65–99)

## 2015-03-29 LAB — I-STAT CG4 LACTIC ACID, ED: Lactic Acid, Venous: 1.88 mmol/L (ref 0.5–2.0)

## 2015-03-29 LAB — URINE MICROSCOPIC-ADD ON

## 2015-03-29 LAB — CBG MONITORING, ED: GLUCOSE-CAPILLARY: 226 mg/dL — AB (ref 65–99)

## 2015-03-29 MED ORDER — ONDANSETRON HCL 4 MG PO TABS: 4.0000 mg | ORAL_TABLET | Freq: Four times a day (QID) | ORAL | Status: DC | PRN

## 2015-03-29 MED ORDER — CYCLOBENZAPRINE HCL 10 MG PO TABS
10.0000 mg | ORAL_TABLET | Freq: Every day | ORAL | Status: DC
  Administered 2015-03-29 – 2015-03-31 (×3): 10 mg via ORAL
  Filled 2015-03-29 (×4): qty 1

## 2015-03-29 MED ORDER — SODIUM CHLORIDE 0.9 % IV BOLUS (SEPSIS)
1000.0000 mL | Freq: Once | INTRAVENOUS | Status: AC
  Administered 2015-03-29: 1000 mL via INTRAVENOUS

## 2015-03-29 MED ORDER — ACETAMINOPHEN 325 MG PO TABS
650.0000 mg | ORAL_TABLET | Freq: Four times a day (QID) | ORAL | Status: DC | PRN
  Administered 2015-03-29 – 2015-03-30 (×2): 650 mg via ORAL
  Filled 2015-03-29 (×2): qty 2

## 2015-03-29 MED ORDER — SODIUM CHLORIDE 0.9 % IV SOLN: 250.0000 mL | INTRAVENOUS | Status: DC | PRN

## 2015-03-29 MED ORDER — SODIUM CHLORIDE 0.9 % IJ SOLN: 3.0000 mL | INTRAMUSCULAR | Status: DC | PRN

## 2015-03-29 MED ORDER — ACETAMINOPHEN 650 MG RE SUPP: 650.0000 mg | Freq: Four times a day (QID) | RECTAL | Status: DC | PRN

## 2015-03-29 MED ORDER — VANCOMYCIN HCL IN DEXTROSE 1-5 GM/200ML-% IV SOLN
1000.0000 mg | Freq: Once | INTRAVENOUS | Status: AC
  Administered 2015-03-29: 1000 mg via INTRAVENOUS
  Filled 2015-03-29: qty 200

## 2015-03-29 MED ORDER — ENOXAPARIN SODIUM 40 MG/0.4ML ~~LOC~~ SOLN
40.0000 mg | SUBCUTANEOUS | Status: DC
Start: 2015-03-29 — End: 2015-04-01
  Administered 2015-03-29 – 2015-03-31 (×3): 40 mg via SUBCUTANEOUS
  Filled 2015-03-29 (×4): qty 0.4

## 2015-03-29 MED ORDER — VANCOMYCIN HCL IN DEXTROSE 1-5 GM/200ML-% IV SOLN
1000.0000 mg | Freq: Two times a day (BID) | INTRAVENOUS | Status: DC
  Administered 2015-03-29 – 2015-03-30 (×2): 1000 mg via INTRAVENOUS
  Filled 2015-03-29 (×2): qty 200

## 2015-03-29 MED ORDER — INSULIN ASPART PROT & ASPART (70-30 MIX) 100 UNIT/ML ~~LOC~~ SUSP
10.0000 [IU] | Freq: Two times a day (BID) | SUBCUTANEOUS | Status: DC
  Administered 2015-03-29 – 2015-03-31 (×3): 10 [IU] via SUBCUTANEOUS
  Filled 2015-03-29: qty 10

## 2015-03-29 MED ORDER — SODIUM CHLORIDE 0.9 % IJ SOLN
3.0000 mL | Freq: Two times a day (BID) | INTRAMUSCULAR | Status: DC
Start: 2015-03-29 — End: 2015-04-01
  Administered 2015-03-30 – 2015-03-31 (×3): 3 mL via INTRAVENOUS

## 2015-03-29 MED ORDER — TAMSULOSIN HCL 0.4 MG PO CAPS
0.4000 mg | ORAL_CAPSULE | Freq: Every evening | ORAL | Status: DC
  Administered 2015-03-29 – 2015-03-31 (×3): 0.4 mg via ORAL
  Filled 2015-03-29 (×4): qty 1

## 2015-03-29 MED ORDER — SODIUM CHLORIDE 0.9 % IV SOLN
INTRAVENOUS | Status: DC
  Administered 2015-03-29 – 2015-03-31 (×4): via INTRAVENOUS

## 2015-03-29 MED ORDER — ONDANSETRON HCL 4 MG/2ML IJ SOLN
4.0000 mg | Freq: Four times a day (QID) | INTRAMUSCULAR | Status: DC | PRN
Start: 2015-03-29 — End: 2015-03-30
  Administered 2015-03-30: 4 mg via INTRAVENOUS

## 2015-03-29 MED ORDER — DICYCLOMINE HCL 10 MG PO CAPS
10.0000 mg | ORAL_CAPSULE | Freq: Three times a day (TID) | ORAL | Status: DC | PRN
  Administered 2015-03-29 – 2015-03-31 (×6): 10 mg via ORAL
  Filled 2015-03-29 (×8): qty 1

## 2015-03-29 MED ORDER — FINASTERIDE 5 MG PO TABS
5.0000 mg | ORAL_TABLET | Freq: Every day | ORAL | Status: DC
  Administered 2015-03-29 – 2015-04-01 (×4): 5 mg via ORAL
  Filled 2015-03-29 (×4): qty 1

## 2015-03-29 MED ORDER — INSULIN ASPART 100 UNIT/ML ~~LOC~~ SOLN
0.0000 [IU] | Freq: Three times a day (TID) | SUBCUTANEOUS | Status: DC
  Administered 2015-03-29 – 2015-03-31 (×2): 2 [IU] via SUBCUTANEOUS

## 2015-03-29 MED ORDER — CETYLPYRIDINIUM CHLORIDE 0.05 % MT LIQD
7.0000 mL | Freq: Two times a day (BID) | OROMUCOSAL | Status: DC
  Administered 2015-03-29 – 2015-04-01 (×6): 7 mL via OROMUCOSAL

## 2015-03-29 NOTE — Consult Note (Signed)
Marcene Corning, MD  Bryna Colander, PA-C  Elodia Florence, PA-C                                  [facility]                [address] [place], [place]  [address]   ORTHOPAEDIC CONSULTATION  Jeff Ferrell            MRN:  [id number] DOB/SEX:  09-26-1950/male     CHIEF COMPLAINT:  Painful great toes  HISTORY: Jeff Ferrell a 64 y.o. male with months of swelling and discoloration of left great toe. Has seen podiatrist for ingrown toenail but no treatment given per patient. Came to ED today with more drainage and pain.  Opposite great toe also hurts but no drainage.   PAST MEDICAL HISTORY: Patient Active Problem List   Diagnosis Date Noted  . Toe infection 03/29/2015  . Diabetic foot ulcer (HCC) 03/29/2015  . Diabetes mellitus (HCC) 03/29/2015  . Diabetic osteomyelitis (HCC) 03/29/2015   Past Medical History  Diagnosis Date  . Diabetes mellitus     regulated by diet  . Chronic back pain    Past Surgical History  Procedure Laterality Date  . Back surgery      screws & hardware to lower back     MEDICATIONS:   Current facility-administered medications:  .  0.9 %  sodium chloride infusion, 250 mL, Intravenous, PRN, Meredeth Ide, MD .  0.9 %  sodium chloride infusion, , Intravenous, Continuous, Meredeth Ide, MD, Last Rate: 75 mL/hr at 03/29/15 1603 .  acetaminophen (TYLENOL) tablet 650 mg, 650 mg, Oral, Q6H PRN **OR** acetaminophen (TYLENOL) suppository 650 mg, 650 mg, Rectal, Q6H PRN, Meredeth Ide, MD .  antiseptic oral rinse (CPC / CETYLPYRIDINIUM CHLORIDE 0.05%) solution 7 mL, 7 mL, Mouth Rinse, BID, Meredeth Ide, MD .  cyclobenzaprine (FLEXERIL) tablet 10 mg, 10 mg, Oral, QHS, Meredeth Ide, MD .  dicyclomine (BENTYL) capsule 10 mg, 10 mg, Oral, TID PRN, Meredeth Ide, MD, 10 mg at 03/29/15 1745 .  enoxaparin (LOVENOX) injection 40 mg, 40 mg, Subcutaneous, Q24H, Meredeth Ide, MD, 40 mg at 03/29/15 1745 .  finasteride (PROSCAR) tablet 5 mg, 5 mg, Oral, Daily,  Meredeth Ide, MD, 5 mg at 03/29/15 1745 .  insulin aspart (novoLOG) injection 0-9 Units, 0-9 Units, Subcutaneous, TID WC, Meredeth Ide, MD, 2 Units at 03/29/15 1745 .  insulin aspart protamine- aspart (NOVOLOG MIX 70/30) injection 10 Units, 10 Units, Subcutaneous, BID WC, Meredeth Ide, MD, 10 Units at 03/29/15 1746 .  ondansetron (ZOFRAN) tablet 4 mg, 4 mg, Oral, Q6H PRN **OR** ondansetron (ZOFRAN) injection 4 mg, 4 mg, Intravenous, Q6H PRN, Meredeth Ide, MD .  sodium chloride 0.9 % injection 3 mL, 3 mL, Intravenous, Q12H, Meredeth Ide, MD .  sodium chloride 0.9 % injection 3 mL, 3 mL, Intravenous, PRN, Meredeth Ide, MD .  tamsulosin (FLOMAX) capsule 0.4 mg, 0.4 mg, Oral, QPM, Meredeth Ide, MD, 0.4 mg at 03/29/15 1745 .  vancomycin (VANCOCIN) IVPB 1000 mg/200 mL premix, 1,000 mg, Intravenous, Q12H, Terri L Green, RPH  ALLERGIES:   Allergies  Allergen Reactions  . Penicillins Hives and Rash    Has patient had a PCN reaction causing immediate rash, facial/tongue/throat swelling, SOB or lightheadedness with hypotension: Yes Has patient had a PCN reaction causing severe  rash involving mucus membranes or skin necrosis: Yes   Has patient had a PCN reaction that required hospitalization No Has patient had a PCN reaction occurring within the last 10 years: No If all of the above answers are "NO", then may proceed with Cephalosporin use.     REVIEW OF SYSTEMS: REVIEWED IN DETAIL IN CHART  FAMILY HISTORY:  No family history on file.  SOCIAL HISTORY:   Social History  Substance Use Topics  . Smoking status: Never Smoker   . Smokeless tobacco: Not on file  . Alcohol Use: 3.6 oz/week    6 Cans of beer per week     Comment: daily     EXAMINATION: Vital signs in last 24 hours: Temp:  [97.9 F (36.6 C)] 97.9 F (36.6 C) (10/10 1500) Pulse Rate:  [117-130] 117 (10/10 1500) Resp:  [16-17] 17 (10/10 1500) BP: (87-138)/(53-78) 138/75 mmHg (10/10 1500) SpO2:  [98 %-100 %] 98 % (10/10  1500) Weight:  [184 lb 15.5 oz (83.9 kg)] 184 lb 15.5 oz (83.9 kg) (10/10 1500)  BP 138/75 mmHg  Pulse 117  Temp(Src) 97.9 F (36.6 C) (Oral)  Resp 17  Ht 5\' 9"  (1.753 m)  Wt 184 lb 15.5 oz (83.9 kg)  BMI 27.30 kg/m2  SpO2 98%  General Appearance:    Alert, cooperative, no distress, appears stated age  Head:    Normocephalic, without obvious abnormality, atraumatic  Eyes:    PERRL, conjunctiva/corneas clear, EOM's intact, fundi    benign, both eyes       Ears:    Normal TM's and external ear canals, both ears  Nose:   Nares normal, septum midline, mucosa normal, no drainage    or sinus tenderness  Throat:   Lips, mucosa, and tongue normal; teeth and gums normal  Neck:   Supple, symmetrical, trachea midline, no adenopathy;       thyroid:  No enlargement/tenderness/nodules; no carotid   bruit or JVD  Back:     Symmetric, no curvature, ROM normal, long scar, moderate tenderness  Lungs:     Clear to auscultation bilaterally, respirations unlabored  Chest wall:    No tenderness or deformity  Heart:    Regular rate and rhythm, S1 and S2 normal, no murmur, rub   or gallop  Abdomen:     Soft, non-tender, bowel sounds active all four quadrants,    no masses, no organomegaly  Genitalia:    Rectal:    Extremities:     Pulses:   2+ and symmetric all extremities  Skin:   Skin color, texture, turgor normal, no rashes or lesions  Lymph nodes:   Cervical, supraclavicular, and axillary nodes normal  Neurologic:   CNII-XII intact. Normal strength, sensation and reflexes      throughout    Musculoskeletal Exam:   Left great toe red and swollen.  Gray from PIP distal and black at tip. Yellow drainage with mild odor. Asensate from midfoot distal. Weakly palp pulse DP. Mild erythema to ankle and mild edema   DIAGNOSTIC STUDIES: Recent laboratory studies:  Recent Labs  03/29/15 1153  WBC 7.1  HGB 13.2  HCT 38.3*  PLT 253    Recent Labs  03/29/15 1153  NA 134*  K 4.2  CL 100*  CO2  23  BUN 7  CREATININE 0.65  GLUCOSE 254*  CALCIUM 8.9   No results found for: INR, PROTIME   Recent Radiographic Studies :  Dg Toe Great Left  03/29/2015  CLINICAL DATA:  Non healing wound for 1 week.  History diabetes.  EXAM: LEFT GREAT TOE  COMPARISON:  None.  FINDINGS: Seen best on the provided lateral radiograph is a wound involving the distal end of the great toe with associated scattered foci of subcutaneous emphysema and osteolysis involving the tuft of the great digit worrisome for osteomyelitis. No radiopaque foreign body. Joint spaces appear preserved. No dislocation. No definite fracture.  IMPRESSION: Findings worrisome for osteomyelitis involving the tuft of the great toe with associated overlying wound. Further evaluation could be performed with MRI as clinically indicated.   Electronically Signed   By: Simonne Come M.D.   On: 03/29/2015 14:04    ASSESSMENT:  Left great toe osteomyelitis   PLAN:  Best managed with amputation of great toe as infection involves bone.  Agree with IV vanco for now. Patient concerned as his father started with toe amputation which did not heal followed by BKA and AKA. Cannot guarantee that will not be his course. Will plan on surgery tomorrow late or Wednesday assuming he is cleared by medical team.  May eat dinner and breakfast for sure.   Audie Wieser G 03/29/2015, 7:17 PM

## 2015-03-29 NOTE — H&P (Signed)
PCP:   Jeff Floro, MD   Chief Complaint:  Infected toe  HPI:  64 year old male who  has a past medical history of Diabetes mellitus and Chronic back pain.  Today presents to the hospital with worsening left toe swelling, patient has a history of diabetes mellitus. He has had pain in the left great toe for last several weeks which became worse over past 1 week. Patient says that it started as a thickening that he scraped. Patient notices worsening swelling of the left mid to as well as left foot and left leg. He noticed yellow-colored discharge from the left toe and could concerned so he came to the hospital. He denies fever, no chest pain no shortness of breath no nausea vomiting or diarrhea. In the ED x-ray of the foot revealed osteomyelitis. Patient started on vancomycin. Orthopedic surgery has been consulted by the ED physician  Allergies:   Allergies  Allergen Reactions  . Penicillins Hives and Rash    Has patient had a PCN reaction causing immediate rash, facial/tongue/throat swelling, SOB or lightheadedness with hypotension: Yes Has patient had a PCN reaction causing severe rash involving mucus membranes or skin necrosis: Yes   Has patient had a PCN reaction that required hospitalization No Has patient had a PCN reaction occurring within the last 10 years: No If all of the above answers are "NO", then may proceed with Cephalosporin use.       Past Medical History  Diagnosis Date  . Diabetes mellitus     regulated by diet  . Chronic back pain     Past Surgical History  Procedure Laterality Date  . Back surgery      screws & hardware to lower back    Prior to Admission medications   Medication Sig Start Date End Date Taking? Authorizing Provider  cyclobenzaprine (FLEXERIL) 10 MG tablet Take 10 mg by mouth at bedtime.    Yes Historical Provider, MD  dicyclomine (BENTYL) 10 MG capsule Take 10 mg by mouth 3 (three) times daily as needed for spasms.    Yes  Historical Provider, MD  finasteride (PROSCAR) 5 MG tablet Take 5 mg by mouth daily.   Yes Historical Provider, MD  glipiZIDE (GLUCOTROL) 10 MG tablet Take 10 mg by mouth every other day.    Yes Historical Provider, MD  insulin aspart protamine- aspart (NOVOLOG MIX 70/30) (70-30) 100 UNIT/ML injection Inject 40 Units into the skin every other day. Takes 40 units with breakfast   Yes Historical Provider, MD  naproxen sodium (ANAPROX) 220 MG tablet Take 440 mg by mouth 2 (two) times daily with a meal.   Yes Historical Provider, MD  Vitamin D, Ergocalciferol, (DRISDOL) 50000 UNITS CAPS capsule Take 50,000 Units by mouth every Sunday.    Yes Historical Provider, MD  nitrofurantoin, macrocrystal-monohydrate, (MACROBID) 100 MG capsule Take 1 capsule (100 mg total) by mouth 2 (two) times daily. Patient not taking: Reported on 03/29/2015 02/05/15   Samuel Jester, DO  tamsulosin (FLOMAX) 0.4 MG CAPS capsule Take 0.4 mg by mouth every evening.     Historical Provider, MD    Social History:  reports that he has never smoked. He does not have any smokeless tobacco history on file. He reports that he drinks about 3.6 oz of alcohol per week. He reports that he does not use illicit drugs.  No family history on file.  Filed Weights   03/29/15 1500  Weight: 83.9 kg (184 lb 15.5 oz)    All the  positives are listed in BOLD  Review of Systems:  HEENT: Headache, blurred vision, runny nose, sore throat Neck: Hypothyroidism, hyperthyroidism,,lymphadenopathy Chest : Shortness of breath, history of COPD, Asthma Heart : Chest pain, history of coronary arterey disease GI:  Nausea, vomiting, diarrhea, constipation, GERD GU: Dysuria, urgency, frequency of urination, hematuria Neuro: Stroke, seizures, syncope Psych: Depression, anxiety, hallucinations   Physical Exam: Blood pressure 138/75, pulse 117, temperature 97.9 F (36.6 C), temperature source Oral, resp. rate 17, height $RemoveB 753 m), weight 83.9 kg  (184 lb 15.5 oz), SpO2 98 %. Constitutional:   Patient is a well-developed and well-nourished male in no acute distress and cooperative with exam. Head: Normocephalic and atraumatic Mouth: Mucus membranes moist Eyes: PERRL, EOMI, conjunctivae normal Neck: Supple, No Thyromegaly Cardiovascular: RRR, S1 normal, S2 normal Pulmonary/Chest: CTAB, no wheezes, rales, or rhonchi Abdominal: Soft. Non-tender, non-distended, bowel sounds are normal, no masses, organomegaly, or guarding present.  Neurological: A&O x3, Strength is normal and symmetric bilaterally, cranial nerve II-XII are grossly intact, no focal motor deficit, sensory intact to light touch bilaterally.  Extremities : Necrosis and marked swelling noted in the left big toe. Mild erythema and edema noted in the left foot. Labs on Admission:  Basic Metabolic Panel:  Recent Labs Lab 03/29/15 1153  NA 134*  K 4.2  CL 100*  CO2 23  GLUCOSE 254*  BUN 7  CREATININE 0.65  CALCIUM 8.9   CBC:  Recent Labs Lab 03/29/15 1153  WBC 7.1  HGB 13.2  HCT 38.3*  MCV 91.0  PLT 253    CBG:  Recent Labs Lab 03/29/15 1205  GLUCAP 226*    Radiological Exams on Admission: Dg Toe Great Left  03/29/2015   CLINICAL DATA:  Non healing wound for 1 week.  History diabetes.  EXAM: LEFT GREAT TOE  COMPARISON:  None.  FINDINGS: Seen best on the provided lateral radiograph is a wound involving the distal end of the great toe with associated scattered foci of subcutaneous emphysema and osteolysis involving the tuft of the great digit worrisome for osteomyelitis. No radiopaque foreign body. Joint spaces appear preserved. No dislocation. No definite fracture.  IMPRESSION: Findings worrisome for osteomyelitis involving the tuft of the great toe with associated overlying wound. Further evaluation could be performed with MRI as clinically indicated.   Electronically Signed   By: Simonne Come M.D.   On: 03/29/2015 14:04       Assessment/Plan Active  Problems:   Toe infection   Diabetic foot ulcer (HCC)   Diabetes mellitus (HCC)  Osteomyelitis Patient presenting with left infected big toe, x-ray revealing osteomyelitis. Will start vancomycin per pharmacy consultation. Orthopedic surgery has been consulted by the ED physician, Dr Fara Chute to see the patient .  Diabetes mellitus We'll start NovoLog 70/30, 10 units twice a day Sliding-scale insulin with NovoLog.  BPH Continue Proscar, Tamsulosin  DVT prophylaxis Lovenox  Code status: Full code  Family discussion: No family at bedside   Time Spent on Admission: 60 min  Annemarie Sebree S [facility] Pager: [phone] 03/29/2015, 3:46 PM  If 7PM-7AM, please contact night-coverage  [email or web address]  Password [email or web address]

## 2015-03-29 NOTE — ED Provider Notes (Signed)
CSN: [id number]     Arrival date & time 03/29/15  1133 History   First MD Initiated Contact with Patient 03/29/15 1250     Chief Complaint  Patient presents with  . Toe Pain     (Consider location/radiation/quality/duration/timing/severity/associated sxs/prior Treatment) Patient is a 64 y.o. male presenting with toe pain. The history is provided by the patient.  Toe Pain This is a new problem. Pertinent negatives include no chest pain, no abdominal pain, no headaches and no shortness of breath.   patient states he's had pain in his left great toe for the last few weeks. Over the last few days has gotten worse. He started to have purulent drainage. States it started as a thickening that he scraped on. He has diabetic and has neuropathy cannot feel his feet. He's not been on antibiotics. States he has had some urinary retention recently and had a Foley placed. States he was told his blood pressure was low then. No fevers. No chills. He states the redness is starting going up his foot also.  Past Medical History  Diagnosis Date  . Diabetes mellitus     regulated by diet  . Chronic back pain    Past Surgical History  Procedure Laterality Date  . Back surgery      screws & hardware to lower back   No family history on file. Social History  Substance Use Topics  . Smoking status: Never Smoker   . Smokeless tobacco: None  . Alcohol Use: 3.6 oz/week    6 Cans of beer per week     Comment: daily    Review of Systems  Constitutional: Negative for fever, activity change and appetite change.  Eyes: Negative for pain.  Respiratory: Negative for chest tightness and shortness of breath.   Cardiovascular: Negative for chest pain and leg swelling.  Gastrointestinal: Negative for nausea, vomiting, abdominal pain and diarrhea.  Genitourinary: Negative for flank pain.  Musculoskeletal: Negative for back pain and neck stiffness.  Skin: Positive for wound. Negative for rash.  Neurological:  Negative for weakness, numbness and headaches.  Psychiatric/Behavioral: Negative for behavioral problems.      Allergies  Penicillins  Home Medications   Prior to Admission medications   Medication Sig Start Date End Date Taking? Authorizing Provider  cyclobenzaprine (FLEXERIL) 10 MG tablet Take 10 mg by mouth at bedtime.    Yes Historical Provider, MD  dicyclomine (BENTYL) 10 MG capsule Take 10 mg by mouth 3 (three) times daily as needed for spasms.    Yes Historical Provider, MD  finasteride (PROSCAR) 5 MG tablet Take 5 mg by mouth daily.   Yes Historical Provider, MD  glipiZIDE (GLUCOTROL) 10 MG tablet Take 10 mg by mouth every other day.    Yes Historical Provider, MD  insulin aspart protamine- aspart (NOVOLOG MIX 70/30) (70-30) 100 UNIT/ML injection Inject 40 Units into the skin every other day. Takes 40 units with breakfast   Yes Historical Provider, MD  naproxen sodium (ANAPROX) 220 MG tablet Take 440 mg by mouth 2 (two) times daily with a meal.   Yes Historical Provider, MD  Vitamin D, Ergocalciferol, (DRISDOL) 50000 UNITS CAPS capsule Take 50,000 Units by mouth every Sunday.    Yes Historical Provider, MD  nitrofurantoin, macrocrystal-monohydrate, (MACROBID) 100 MG capsule Take 1 capsule (100 mg total) by mouth 2 (two) times daily. Patient not taking: Reported on 03/29/2015 02/05/15   Samuel Jester, DO  tamsulosin (FLOMAX) 0.4 MG CAPS capsule Take 0.4 mg by mouth  every evening.     Historical Provider, MD   BP 138/75 mmHg  Pulse 117  Temp(Src) 97.9 F (36.6 C) (Oral)  Resp 17  Ht  (1.753 m)  Wt 184 lb 15.5 oz (83.9 kg)  BMI 27.30 kg/m2  SpO2 98% Physical Exam  Constitutional: He appears well-developed.  HENT:  Head: Normocephalic.  Cardiovascular:  tachycardia  Pulmonary/Chest: Effort normal.  Abdominal: Soft.  Musculoskeletal:  Dorsalis pedis pulse intact.  Neurological: He is alert.  Skin:  Necrosis and infection of left great toe. Erythema and swelling  tracking proximally up through foot.        ED Course  Procedures (including critical care time) Labs Review Labs Reviewed  BASIC METABOLIC PANEL - Abnormal; Notable for the following:    Sodium 134 (*)    Chloride 100 (*)    Glucose, Bld 254 (*)    All other components within normal limits  CBC - Abnormal; Notable for the following:    RBC 4.21 (*)    HCT 38.3 (*)    All other components within normal limits  URINALYSIS, ROUTINE W REFLEX MICROSCOPIC (NOT AT [facility]) - Abnormal; Notable for the following:    Leukocytes, UA SMALL (*)    All other components within normal limits  CBG MONITORING, ED - Abnormal; Notable for the following:    Glucose-Capillary 226 (*)    All other components within normal limits  CULTURE, BLOOD (ROUTINE X 2)  CULTURE, BLOOD (ROUTINE X 2)  URINE MICROSCOPIC-ADD ON  I-STAT CG4 LACTIC ACID, ED    Imaging Review Dg Toe Great Left  03/29/2015   CLINICAL DATA:  Non healing wound for 1 week.  History diabetes.  EXAM: LEFT GREAT TOE  COMPARISON:  None.  FINDINGS: Seen best on the provided lateral radiograph is a wound involving the distal end of the great toe with associated scattered foci of subcutaneous emphysema and osteolysis involving the tuft of the great digit worrisome for osteomyelitis. No radiopaque foreign body. Joint spaces appear preserved. No dislocation. No definite fracture.  IMPRESSION: Findings worrisome for osteomyelitis involving the tuft of the great toe with associated overlying wound. Further evaluation could be performed with MRI as clinically indicated.   Electronically Signed   By: Simonne Come M.D.   On: 03/29/2015 14:04   I have personally reviewed and evaluated these images and lab results as part of my medical decision-making.   EKG Interpretation None      MDM   Final diagnoses:  Acute osteomyelitis of toe of left foot (HCC)    Patient with toe infection. He is diabetic. Found to be tachycardic and hypotensive but not  febrile. Apparent osteomyelitis. Started on antibiotics and will admit to internal medicine. Also discussed with Dr. Fara Chute, from ortho, who will see the patient in consult.    Benjiman Core, MD 03/29/15 [phone]

## 2015-03-29 NOTE — ED Notes (Signed)
Per pt, states he noticed left great toe nail was black-now foot swollen and going up leg

## 2015-03-29 NOTE — ED Notes (Signed)
Report given to [place]. Ready for patient transport.

## 2015-03-29 NOTE — Progress Notes (Signed)
Pt confirmed with ED CM that he is seen by Dr Christella Scheuermann at [facility] Reports [facility] EPIC updated

## 2015-03-29 NOTE — ED Notes (Signed)
Admissions rn at the bedside.

## 2015-03-29 NOTE — ED Notes (Signed)
Pt. Is aware that we need a urine specimen, urinal at bedside. 

## 2015-03-29 NOTE — Progress Notes (Signed)
ANTIBIOTIC CONSULT NOTE - INITIAL  Pharmacy Consult for Vancomycin Indication: r/o osteomyelitis L great toe  Allergies  Allergen Reactions  . Penicillins Hives and Rash    Has patient had a PCN reaction causing immediate rash, facial/tongue/throat swelling, SOB or lightheadedness with hypotension: Yes Has patient had a PCN reaction causing severe rash involving mucus membranes or skin necrosis: Yes   Has patient had a PCN reaction that required hospitalization No Has patient had a PCN reaction occurring within the last 10 years: No If all of the above answers are "NO", then may proceed with Cephalosporin use.    Patient Measurements: Height:  (175.3 cm) Weight: 184 lb 15.5 oz (83.9 kg) IBW/kg (Calculated) : 70.7  Vital Signs: Temp: 97.9 F (36.6 C) (10/10 1500) Temp Source: Oral (10/10 1500) BP: 138/75 mmHg (10/10 1500) Pulse Rate: 117 (10/10 1500) Intake/Output from previous day:   Intake/Output from this shift:    Labs:  Recent Labs  03/29/15 1153  WBC 7.1  HGB 13.2  PLT 253  CREATININE 0.65   Estimated Creatinine Clearance: 92.1 mL/min (by C-G formula based on Cr of 0.65). No results for input(s): VANCOTROUGH, VANCOPEAK, VANCORANDOM, GENTTROUGH, GENTPEAK, GENTRANDOM, TOBRATROUGH, TOBRAPEAK, TOBRARND, AMIKACINPEAK, AMIKACINTROU, AMIKACIN in the last 72 hours.   Microbiology: Recent Results (from the past 720 hour(s))  Culture, blood (routine x 2)     Status: None (Preliminary result)   Collection Time: 03/29/15  1:15 PM  Result Value Ref Range Status   Specimen Description   Final    BLOOD LEFT FOREARM Performed at [facility]    Special Requests BOTTLES DRAWN AEROBIC AND ANAEROBIC [facility]  Final   Culture PENDING  Incomplete   Report Status PENDING  Incomplete   Medical History: Past Medical History  Diagnosis Date  . Diabetes mellitus     regulated by diet  . Chronic back pain    Medications:  Scheduled:  . cyclobenzaprine  10 mg  Oral QHS  . enoxaparin (LOVENOX) injection  40 mg Subcutaneous Q24H  . finasteride  5 mg Oral Daily  . insulin aspart protamine- aspart  10 Units Subcutaneous BID WC  . sodium chloride  3 mL Intravenous Q12H  . tamsulosin  0.4 mg Oral QPM   Anti-infectives    Start     Dose/Rate Route Frequency Ordered Stop   03/29/15 1345  vancomycin (VANCOCIN) IVPB 1000 mg/200 mL premix     1,000 mg 200 mL/hr over 60 Minutes Intravenous  Once 03/29/15 1340 03/29/15 1514     Assessment: 64 yoM with L great toe wound, redness and swelling LLE. Hx of DM with neuropathy. Vancomycin 1gm given in ED, further doses per Pharmacy protocol for rule out osteomyelitis.  Goal of Therapy:  Vancomycin trough level 15-20 mcg/ml  Plan:   Aiming for higher trough with possible osteomyelitis  Vancomycin 1gm IV q12  Otho Bellows PharmD Pager [phone] 03/29/2015, 4:10 PM

## 2015-03-29 NOTE — ED Notes (Signed)
Bed: WA01 Expected date:  Expected time:  Means of arrival:  Comments: Hold for triage 1 

## 2015-03-30 ENCOUNTER — Encounter (HOSPITAL_COMMUNITY): Payer: Self-pay | Admitting: Anesthesiology

## 2015-03-30 ENCOUNTER — Encounter (HOSPITAL_COMMUNITY)
Admission: EM | Disposition: A | Discharge status: Home Health | Payer: Self-pay | Source: Home / Self Care | Attending: Family Medicine

## 2015-03-30 ENCOUNTER — Inpatient Hospital Stay (HOSPITAL_COMMUNITY): Payer: Medicare Other | Admitting: Anesthesiology

## 2015-03-30 DIAGNOSIS — E440 Moderate protein-calorie malnutrition: Secondary | ICD-10-CM | POA: Insufficient documentation

## 2015-03-30 DIAGNOSIS — L089 Local infection of the skin and subcutaneous tissue, unspecified: Secondary | ICD-10-CM

## 2015-03-30 HISTORY — PX: AMPUTATION TOE: SHX6595

## 2015-03-30 LAB — GLUCOSE, CAPILLARY
GLUCOSE-CAPILLARY: 89 mg/dL (ref 65–99)
Glucose-Capillary: 95 mg/dL (ref 65–99)
Glucose-Capillary: 110 mg/dL — ABNORMAL HIGH (ref 65–99)
Glucose-Capillary: 97 mg/dL (ref 65–99)

## 2015-03-30 LAB — HEMOGLOBIN A1C
HEMOGLOBIN A1C: 7.6 % — AB (ref 4.8–5.6)
MEAN PLASMA GLUCOSE: 171 mg/dL

## 2015-03-30 LAB — COMPREHENSIVE METABOLIC PANEL
ALBUMIN: 3.2 g/dL — AB (ref 3.5–5.0)
ALK PHOS: 182 U/L — AB (ref 38–126)
ALT: 18 U/L (ref 17–63)
ANION GAP: 7 (ref 5–15)
AST: 40 U/L (ref 15–41)
BILIRUBIN TOTAL: 1 mg/dL (ref 0.3–1.2)
BUN: 8 mg/dL (ref 6–20)
CALCIUM: 9 mg/dL (ref 8.9–10.3)
CO2: 26 mmol/L (ref 22–32)
CREATININE: 0.59 mg/dL — AB (ref 0.61–1.24)
Chloride: 102 mmol/L (ref 101–111)
GFR calc Af Amer: >60 mL/min (ref >60)
GFR calc non Af Amer: >60 mL/min (ref >60)
GLUCOSE: 88 mg/dL (ref 65–99)
Potassium: 3.9 mmol/L (ref 3.5–5.1)
SODIUM: 135 mmol/L (ref 135–145)
TOTAL PROTEIN: 7.1 g/dL (ref 6.5–8.1)

## 2015-03-30 LAB — MRSA PCR SCREENING: MRSA by PCR: NEGATIVE

## 2015-03-30 LAB — CBC
HEMATOCRIT: 39.9 % (ref 39.0–52.0)
HEMOGLOBIN: 13.3 g/dL (ref 13.0–17.0)
MCH: 30.6 pg (ref 26.0–34.0)
MCHC: 33.3 g/dL (ref 30.0–36.0)
MCV: 91.9 fL (ref 78.0–100.0)
Platelets: 222 10*3/uL (ref 150–400)
RBC: 4.34 MIL/uL (ref 4.22–5.81)
RDW: 12.1 % (ref 11.5–15.5)
WBC: 7.1 10*3/uL (ref 4.0–10.5)

## 2015-03-30 SURGERY — AMPUTATION, TOE
Anesthesia: Monitor Anesthesia Care | Site: Toe | Laterality: Left

## 2015-03-30 MED ORDER — FENTANYL CITRATE (PF) 100 MCG/2ML IJ SOLN: 25.0000 ug | INTRAMUSCULAR | Status: DC | PRN

## 2015-03-30 MED ORDER — HYDROCODONE-ACETAMINOPHEN 5-325 MG PO TABS: 1.0000 | ORAL_TABLET | Freq: Four times a day (QID) | ORAL | Status: DC | PRN

## 2015-03-30 MED ORDER — PROPOFOL 10 MG/ML IV BOLUS
INTRAVENOUS | Status: AC
  Filled 2015-03-30: qty 20

## 2015-03-30 MED ORDER — FENTANYL CITRATE (PF) 100 MCG/2ML IJ SOLN
INTRAMUSCULAR | Status: DC | PRN
  Administered 2015-03-30 (×2): 50 ug via INTRAVENOUS

## 2015-03-30 MED ORDER — LIDOCAINE HCL (CARDIAC) 20 MG/ML IV SOLN
INTRAVENOUS | Status: DC | PRN
  Administered 2015-03-30: 50 mg via INTRAVENOUS

## 2015-03-30 MED ORDER — MIDAZOLAM HCL 2 MG/2ML IJ SOLN
INTRAMUSCULAR | Status: AC
  Filled 2015-03-30: qty 4

## 2015-03-30 MED ORDER — HYDROCODONE-ACETAMINOPHEN 5-325 MG PO TABS
1.0000 | ORAL_TABLET | ORAL | Status: DC | PRN
  Administered 2015-03-31: 2 via ORAL
  Administered 2015-03-31: 1 via ORAL
  Administered 2015-03-31 – 2015-04-01 (×2): 2 via ORAL
  Filled 2015-03-30 (×2): qty 2
  Filled 2015-03-30: qty 1
  Filled 2015-03-30: qty 2

## 2015-03-30 MED ORDER — BUPIVACAINE-EPINEPHRINE (PF) 0.5% -1:200000 IJ SOLN
INTRAMUSCULAR | Status: DC | PRN
  Administered 2015-03-30: 30 mL via PERINEURAL

## 2015-03-30 MED ORDER — FENTANYL CITRATE (PF) 100 MCG/2ML IJ SOLN
INTRAMUSCULAR | Status: AC
  Filled 2015-03-30: qty 4

## 2015-03-30 MED ORDER — 0.9 % SODIUM CHLORIDE (POUR BTL) OPTIME
TOPICAL | Status: DC | PRN
  Administered 2015-03-30: 1000 mL

## 2015-03-30 MED ORDER — MIDAZOLAM HCL 5 MG/5ML IJ SOLN
INTRAMUSCULAR | Status: DC | PRN
  Administered 2015-03-30 (×2): 1 mg via INTRAVENOUS

## 2015-03-30 MED ORDER — METOCLOPRAMIDE HCL 5 MG/ML IJ SOLN: 5.0000 mg | Freq: Three times a day (TID) | INTRAMUSCULAR | Status: DC | PRN

## 2015-03-30 MED ORDER — ONDANSETRON HCL 4 MG/2ML IJ SOLN: 4.0000 mg | Freq: Four times a day (QID) | INTRAMUSCULAR | Status: DC | PRN

## 2015-03-30 MED ORDER — ACETAMINOPHEN 650 MG RE SUPP: 650.0000 mg | Freq: Four times a day (QID) | RECTAL | Status: DC | PRN

## 2015-03-30 MED ORDER — VANCOMYCIN HCL 10 G IV SOLR
1250.0000 mg | Freq: Two times a day (BID) | INTRAVENOUS | Status: DC
  Administered 2015-03-30 – 2015-04-01 (×4): 1250 mg via INTRAVENOUS
  Filled 2015-03-30 (×5): qty 1250

## 2015-03-30 MED ORDER — ACETAMINOPHEN 325 MG PO TABS: 650.0000 mg | ORAL_TABLET | Freq: Four times a day (QID) | ORAL | Status: DC | PRN

## 2015-03-30 MED ORDER — ONDANSETRON HCL 4 MG/2ML IJ SOLN
INTRAMUSCULAR | Status: AC
  Filled 2015-03-30: qty 2

## 2015-03-30 MED ORDER — BUPIVACAINE-EPINEPHRINE (PF) 0.5% -1:200000 IJ SOLN
INTRAMUSCULAR | Status: AC
  Filled 2015-03-30: qty 30

## 2015-03-30 MED ORDER — ONDANSETRON HCL 4 MG PO TABS: 4.0000 mg | ORAL_TABLET | Freq: Four times a day (QID) | ORAL | Status: DC | PRN

## 2015-03-30 MED ORDER — PROPOFOL 500 MG/50ML IV EMUL
INTRAVENOUS | Status: DC | PRN
  Administered 2015-03-30: 140 ug/kg/min via INTRAVENOUS

## 2015-03-30 MED ORDER — LIDOCAINE HCL (CARDIAC) 20 MG/ML IV SOLN
INTRAVENOUS | Status: AC
  Filled 2015-03-30: qty 5

## 2015-03-30 MED ORDER — OXYCODONE HCL 5 MG PO TABS: 5.0000 mg | ORAL_TABLET | Freq: Once | ORAL | Status: DC | PRN

## 2015-03-30 MED ORDER — PROPOFOL 10 MG/ML IV BOLUS
INTRAVENOUS | Status: DC | PRN
  Administered 2015-03-30: 40 mg via INTRAVENOUS

## 2015-03-30 MED ORDER — HYDRALAZINE HCL 25 MG PO TABS
25.0000 mg | ORAL_TABLET | Freq: Four times a day (QID) | ORAL | Status: DC | PRN
  Filled 2015-03-30: qty 1

## 2015-03-30 MED ORDER — METOCLOPRAMIDE HCL 10 MG PO TABS: 5.0000 mg | ORAL_TABLET | Freq: Three times a day (TID) | ORAL | Status: DC | PRN

## 2015-03-30 MED ORDER — LACTATED RINGERS IV SOLN
INTRAVENOUS | Status: DC | PRN
  Administered 2015-03-30: 15:00:00 via INTRAVENOUS

## 2015-03-30 MED ORDER — OXYCODONE HCL 5 MG/5ML PO SOLN
5.0000 mg | Freq: Once | ORAL | Status: DC | PRN
  Filled 2015-03-30: qty 5

## 2015-03-30 SURGICAL SUPPLY — 37 items
BAG SPEC THK2 15X12 ZIP CLS (MISCELLANEOUS) ×1
BAG ZIPLOCK 12X15 (MISCELLANEOUS) ×3 IMPLANT
BANDAGE ESMARK 6X9 LF (GAUZE/BANDAGES/DRESSINGS) ×1 IMPLANT
BLADE OSCILLATING/SAGITTAL (BLADE) ×3
BLADE SW THK.38XMED LNG THN (BLADE) IMPLANT
BNDG CMPR 9X6 STRL LF SNTH (GAUZE/BANDAGES/DRESSINGS) ×2
BNDG COHESIVE 3X5 TAN STRL LF (GAUZE/BANDAGES/DRESSINGS) ×3 IMPLANT
BNDG COHESIVE 6X5 TAN STRL LF (GAUZE/BANDAGES/DRESSINGS) ×3 IMPLANT
BNDG ESMARK 6X9 LF (GAUZE/BANDAGES/DRESSINGS) ×6
BNDG GAUZE ELAST 4 BULKY (GAUZE/BANDAGES/DRESSINGS) ×2 IMPLANT
CUFF TOURN SGL QUICK 34 (TOURNIQUET CUFF) ×3
CUFF TRNQT CYL 34X4X40X1 (TOURNIQUET CUFF) ×1 IMPLANT
DRAIN PENROSE 18X1/4 LTX STRL (WOUND CARE) ×2 IMPLANT
DRAPE SHEET LG 3/4 BI-LAMINATE (DRAPES) ×3 IMPLANT
DRAPE SURG 17X11 SM STRL (DRAPES) ×6 IMPLANT
DRAPE U-SHAPE 47X51 STRL (DRAPES) ×6 IMPLANT
DRSG ADAPTIC 3X8 NADH LF (GAUZE/BANDAGES/DRESSINGS) ×3 IMPLANT
DURAPREP 26ML APPLICATOR (WOUND CARE) ×3 IMPLANT
ELECT REM PT RETURN 9FT ADLT (ELECTROSURGICAL) ×3
ELECTRODE REM PT RTRN 9FT ADLT (ELECTROSURGICAL) ×1 IMPLANT
GAUZE SPONGE 4X4 12PLY STRL (GAUZE/BANDAGES/DRESSINGS) ×4 IMPLANT
GLOVE BIOGEL PI IND STRL 8.5 (GLOVE) ×1 IMPLANT
GLOVE BIOGEL PI INDICATOR 8.5 (GLOVE) ×2
GLOVE SURG ORTHO 9.0 STRL STRW (GLOVE) ×3 IMPLANT
GOWN STRL REUS W/ TWL XL LVL3 (GOWN DISPOSABLE) ×1 IMPLANT
GOWN STRL REUS W/TWL XL LVL3 (GOWN DISPOSABLE) ×3
KIT BASIN OR (CUSTOM PROCEDURE TRAY) ×3 IMPLANT
MANIFOLD NEPTUNE II (INSTRUMENTS) ×5 IMPLANT
NS IRRIG 1000ML POUR BTL (IV SOLUTION) ×3 IMPLANT
PACK ORTHO EXTREMITY (CUSTOM PROCEDURE TRAY) ×2 IMPLANT
PAD CAST 4YDX4 CTTN HI CHSV (CAST SUPPLIES) ×1 IMPLANT
PADDING CAST COTTON 4X4 STRL (CAST SUPPLIES) ×3
POSITIONER SURGICAL ARM (MISCELLANEOUS) ×6 IMPLANT
STOCKINETTE 8 INCH (MISCELLANEOUS) ×3 IMPLANT
SUCTION FRAZIER TIP 10 FR DISP (SUCTIONS) ×1 IMPLANT
SUT ETHILON 2 0 PSLX (SUTURE) ×6 IMPLANT
WATER STERILE IRR 1500ML POUR (IV SOLUTION) ×3 IMPLANT

## 2015-03-30 NOTE — Transfer of Care (Signed)
Immediate Anesthesia Transfer of Care Note  Patient: Jeff Ferrell  Procedure(s) Performed: Procedure(s): AMPUTATION LEFT GREAT TOE (Left)  Patient Location: PACU  Anesthesia Type:MAC combined with regional for post-op pain  Level of Consciousness:  sedated, patient cooperative and responds to stimulation  Airway & Oxygen Therapy:Patient Spontanous Breathing and Patient connected to face mask oxgen  Post-op Assessment:  Report given to PACU RN and Post -op Vital signs reviewed and stable  Post vital signs:  Reviewed and stable  Last Vitals:  Filed Vitals:   03/30/15 1335  BP: 152/82  Pulse: 104  Temp: 36.8 C  Resp: 20    Complications: No apparent anesthesia complications

## 2015-03-30 NOTE — Progress Notes (Signed)
[facility] PROGRESS NOTE  Edgel Degnan ZOX:[id number] DOB: [birth year] DOA: 03/29/2015 PCP: [name], MD  Assessment/Plan:   Osteomyelitis Patient presenting with left infected big toe, x-ray revealing osteomyelitis. Started on vancomycin per pharmacy consultation. Orthopedic surgery has seen the patient, likely surgery today.  Diabetes mellitus  NovoLog 70/30, 10 units twice a day Sliding-scale insulin with NovoLog.  Hypertension  Start hydralazine 25 m by mouth every 6 hours when necessary  BPH Continue Proscar, Tamsulosin  DVT prophylaxis Lovenox  Code Status: Full code Family Communication: No family at bedside Disposition Plan:  SNF    Consultants:  Ortho  Procedures:  None  Antibiotics:  Vancomycin  HPI/Subjective: 64 year old male who  has a past medical history of Diabetes mellitus and Chronic back pain.  Today presents to the hospital with worsening left toe swelling, patient has a history of diabetes mellitus. He has had pain in the left great toe for last several weeks which became worse over past 1 week. Patient says that it started as a thickening that he scraped. Patient notices worsening swelling of the left mid to as well as left foot and left leg. He noticed yellow-colored discharge from the left toe and could concerned so he came to the hospital. He denies fever, no chest pain no shortness of breath no nausea vomiting or diarrhea. In the ED x-ray of the foot revealed osteomyelitis. Patient started on vancomycin. Orthopedic surgery has been consulted by the ED physician  This morning patient feels somewhat better.  Objective: Filed Vitals:   03/30/15 1335  BP: 152/82  Pulse: 104  Temp: 98.2 F (36.8 C)  Resp: 20    Intake/Output Summary (Last 24 hours) at 03/30/15 1426 Last data filed at 03/30/15 1241  Gross per 24 hour  Intake 656.25 ml  Output   1100 ml  Net -443.75 ml   Filed Weights   03/29/15 1500  Weight:  83.9 kg (184 lb 15.5 oz)    Exam:   General: Appears in no acute distress  Cardiovascular: *S1-S2 is regular  Respiratory: Clear to auscultation bilaterally  Abdomen: *Soft, nontender, no organomegaly  Musculoskeletal: No cyanosis/clubbing/edema of the lower extremities  Foot- left big toe tip gangrenous, pus noted on the dorsal aspect of the left big toe  Data Reviewed: Basic Metabolic Panel:  Recent Labs Lab 03/29/15 1153 03/30/15 0610  NA 134* 135  K 4.2 3.9  CL 100* 102  CO2 23 26  GLUCOSE 254* 88  BUN 7 8  CREATININE 0.65 0.59*  CALCIUM 8.9 9.0   Liver Function Tests:  Recent Labs Lab 03/30/15 0610  AST 40  ALT 18  ALKPHOS 182*  BILITOT 1.0  PROT 7.1  ALBUMIN 3.2*   No results for input(s): LIPASE, AMYLASE in the last 168 hours. No results for input(s): AMMONIA in the last 168 hours. CBC:  Recent Labs Lab 03/29/15 1153 03/30/15 0610  WBC 7.1 7.1  HGB 13.2 13.3  HCT 38.3* 39.9  MCV 91.0 91.9  PLT 253 222    CBG:  Recent Labs Lab 03/29/15 1205 03/29/15 1644 03/29/15 2212 03/30/15 0725 03/30/15 1108  GLUCAP 226* 157* 119* 95 89    Recent Results (from the past 240 hour(s))  Culture, blood (routine x 2)     Status: None (Preliminary result)   Collection Time: 03/29/15  1:10 PM  Result Value Ref Range Status   Specimen Description BLOOD RIGHT ANTECUBITAL  Final   Special Requests BOTTLES DRAWN AEROBIC AND ANAEROBIC 5CC   Final  Culture   Final    NO GROWTH < 24 HOURS Performed at [facility]    Report Status PENDING  Incomplete  Culture, blood (routine x 2)     Status: None (Preliminary result)   Collection Time: 03/29/15  1:15 PM  Result Value Ref Range Status   Specimen Description BLOOD LEFT FOREARM  Final   Special Requests BOTTLES DRAWN AEROBIC AND ANAEROBIC 5CC   Final   Culture   Final    NO GROWTH < 24 HOURS Performed at [facility]    Report Status PENDING  Incomplete  MRSA PCR Screening      Status: None   Collection Time: 03/30/15 12:06 AM  Result Value Ref Range Status   MRSA by PCR NEGATIVE NEGATIVE Final    Comment:        The GeneXpert MRSA Assay (FDA approved for NASAL specimens only), is one component of a comprehensive MRSA colonization surveillance program. It is not intended to diagnose MRSA infection nor to guide or monitor treatment for MRSA infections.      Studies: Dg Toe Great Left  03/29/2015   CLINICAL DATA:  Non healing wound for 1 week.  History diabetes.  EXAM: LEFT GREAT TOE  COMPARISON:  None.  FINDINGS: Seen best on the provided lateral radiograph is a wound involving the distal end of the great toe with associated scattered foci of subcutaneous emphysema and osteolysis involving the tuft of the great digit worrisome for osteomyelitis. No radiopaque foreign body. Joint spaces appear preserved. No dislocation. No definite fracture.  IMPRESSION: Findings worrisome for osteomyelitis involving the tuft of the great toe with associated overlying wound. Further evaluation could be performed with MRI as clinically indicated.   Electronically Signed   By: Simonne Come M.D.   On: 03/29/2015 14:04    Scheduled Meds: . [MAR Hold] antiseptic oral rinse  7 mL Mouth Rinse BID  . [MAR Hold] cyclobenzaprine  10 mg Oral QHS  . [MAR Hold] enoxaparin (LOVENOX) injection  40 mg Subcutaneous Q24H  . [MAR Hold] finasteride  5 mg Oral Daily  . [MAR Hold] insulin aspart  0-9 Units Subcutaneous TID WC  . [MAR Hold] insulin aspart protamine- aspart  10 Units Subcutaneous BID WC  . [MAR Hold] sodium chloride  3 mL Intravenous Q12H  . [MAR Hold] tamsulosin  0.4 mg Oral QPM  . [MAR Hold] vancomycin  1,250 mg Intravenous Q12H   Continuous Infusions: . sodium chloride 75 mL/hr at 03/30/15 1204    Active Problems:   Toe infection   Diabetic foot ulcer (HCC)   Diabetes mellitus (HCC)   Diabetic osteomyelitis (HCC)    Time spent: *25 min    [facility] Pager [phone]*. If 7PM-7AM, please contact night-coverage at [email or web address], password [facility] 03/30/2015, 2:26 PM  LOS: 1 day

## 2015-03-30 NOTE — Anesthesia Procedure Notes (Signed)
Anesthesia Regional Block:  Popliteal block  Pre-Anesthetic Checklist: ,, timeout performed, Correct Patient, Correct Site, Correct Laterality, Correct Procedure, Correct Position, site marked, Risks and benefits discussed,  Surgical consent,  Pre-op evaluation,  At surgeon's request and post-op pain management  Laterality: Left  Prep: chloraprep       Needles:  Injection technique: Single-shot  Needle Type: Echogenic Stimulator Needle     Needle Length: 9cm 9 cm Needle Gauge: 21 and 21 G    Additional Needles:  Procedures: ultrasound guided (picture in chart) and nerve stimulator Popliteal block  Nerve Stimulator or Paresthesia:  Response: plantar flexion of foot, 0.45 mA,   Additional Responses:   Narrative:  Start time: 03/30/2015 2:56 PM End time: 03/30/2015 3:05 PM Injection made incrementally with aspirations every 5 mL.  Performed by: Personally  Anesthesiologist: Catherina Pates  Additional Notes: Functioning IV was confirmed and monitors were applied.  A 90mm 21ga Arrow echogenic stimulator needle was used. Sterile prep and drape,hand hygiene and sterile gloves were used.  Negative aspiration and negative test dose prior to incremental administration of local anesthetic. The patient tolerated the procedure well.  Ultrasound guidance: relevent anatomy identified, needle position confirmed, local anesthetic spread visualized around nerve(s), vascular puncture avoided.  Image printed for medical record.

## 2015-03-30 NOTE — Progress Notes (Signed)
Initial Nutrition Assessment  DOCUMENTATION CODES:   Non-severe (moderate) malnutrition in context of acute illness/injury  INTERVENTION:  - Will order supplements with diet advancement - RD will continue to monitor for needs  NUTRITION DIAGNOSIS:   Inadequate oral intake related to inability to eat as evidenced by NPO status  GOAL:   Patient will meet greater than or equal to 90% of their needs  MONITOR:   Diet advancement, Weight trends, Labs, Skin, I & O's  REASON FOR ASSESSMENT:   Malnutrition Screening Tool  ASSESSMENT:   64 year old male with PMH of DM and chronic back pain. Today presents to the hospital with worsening left toe swelling, patient has a history of diabetes mellitus. He has had pain in the left great toe for last several weeks which became worse over past 1 week. Patient says that it started as a thickening that he scraped. Patient notices worsening swelling of the left mid to as well as left foot and left leg. He noticed yellow-colored discharge from the left toe and could concerned so he came to the hospital.  Pt seen for MST. BMI indicates overweight status. Pt has been NPO since admission pending surgery to L great toe; xray in ED showed osteomyelitis per MD note. Pt reports poor appetite with lack of taste to foods x6 months PTA. He does not feel that this corresponded with any medication changes or any other changes to medical hx or social life. He states "some days I will go 2 days without eating because I am not hungry."   Pt states that because of this he has lost weight. He was at the [place] 1 month ago and reports he weighed 184 lbs at that time and that on admission he was told he weighs 174 lbs; this indicates 10 lb weight loss (5.4% body weight) in 1 month which is significant for time frame. Mild muscle wasting noted.   When diet advanced after surgery will order Prostat due to increased protein needs. Not able to meet needs at this time. Medications  reviewed. Labs reviewed; CBGs: 89-226 mg/dL, creatinine low.   Diet Order:  Diet NPO time specified  Skin:  Wound (see comment) (L great toe DM ulcer)  Last BM:  10/8  Height:   Ht Readings from Last 1 Encounters:  03/29/15  (1.753 m)    Weight:   Wt Readings from Last 1 Encounters:  03/29/15 184 lb 15.5 oz (83.9 kg)    Ideal Body Weight:  72.73 kg (kg)  BMI:  Body mass index is 27.3 kg/(m^2).  Estimated Nutritional Needs:   Kcal:  1850-2050  Protein:  85-100 grams  Fluid:  2.2 L/day  EDUCATION NEEDS:   No education needs identified at this time     Trenton Gammon, RD, LDN Inpatient Clinical Dietitian Pager # [phone] After hours/weekend pager # [phone]

## 2015-03-30 NOTE — Progress Notes (Signed)
ANTIBIOTIC CONSULT NOTE - Follow-Up  Pharmacy Consult for Vancomycin Indication: osteomyelitis of L great toe  Allergies  Allergen Reactions  . Penicillins Hives and Rash    Has patient had a PCN reaction causing immediate rash, facial/tongue/throat swelling, SOB or lightheadedness with hypotension: Yes Has patient had a PCN reaction causing severe rash involving mucus membranes or skin necrosis: Yes   Has patient had a PCN reaction that required hospitalization No Has patient had a PCN reaction occurring within the last 10 years: No If all of the above answers are "NO", then may proceed with Cephalosporin use.    Patient Measurements: Height:  (175.3 cm) Weight: 184 lb 15.5 oz (83.9 kg) IBW/kg (Calculated) : 70.7  Vital Signs: Temp: 98.3 F (36.8 C) (10/11 0517) Temp Source: Oral (10/11 0517) BP: 160/88 mmHg (10/11 0517) Pulse Rate: 106 (10/11 0517) Intake/Output from previous day: 10/10 0701 - 10/11 0700 In: 416.3 [P.O.:120; I.V.:296.3] Out: 450 [Urine:450] Intake/Output from this shift: Total I/O In: 240 [P.O.:240] Out: 650 [Urine:650]  Labs:  Recent Labs  03/29/15 1153 03/30/15 0610  WBC 7.1 7.1  HGB 13.2 13.3  PLT 253 222  CREATININE 0.65 0.59*   Estimated Creatinine Clearance: 92.1 mL/min (by C-G formula based on Cr of 0.59). No results for input(s): VANCOTROUGH, VANCOPEAK, VANCORANDOM, GENTTROUGH, GENTPEAK, GENTRANDOM, TOBRATROUGH, TOBRAPEAK, TOBRARND, AMIKACINPEAK, AMIKACINTROU, AMIKACIN in the last 72 hours.   Microbiology: Recent Results (from the past 720 hour(s))  Culture, blood (routine x 2)     Status: None (Preliminary result)   Collection Time: 03/29/15  1:15 PM  Result Value Ref Range Status   Specimen Description   Final    BLOOD LEFT FOREARM Performed at [facility]    Special Requests BOTTLES DRAWN AEROBIC AND ANAEROBIC [facility]  Final   Culture PENDING  Incomplete   Report Status PENDING  Incomplete  MRSA PCR Screening      Status: None   Collection Time: 03/30/15 12:06 AM  Result Value Ref Range Status   MRSA by PCR NEGATIVE NEGATIVE Final    Comment:        The GeneXpert MRSA Assay (FDA approved for NASAL specimens only), is one component of a comprehensive MRSA colonization surveillance program. It is not intended to diagnose MRSA infection nor to guide or monitor treatment for MRSA infections.    Medical History: Past Medical History  Diagnosis Date  . Diabetes mellitus     regulated by diet  . Chronic back pain     Assessment: 30 yoM with PMH of DM with neuropathy who presented to [facility] ED on 10/10 with worsening swelling of LLE, pain, and yellow discharge from L great toe. X-ray of the foot revealed osteomyelitis. Pharmacy consulted to assist with dosing of Vancomycin. Patient is to undergo amputation of L great toe later today.  10/10 >> Vancomycin >>   10/10 blood x 2: IP 10/11 MRSA PCR: negative  Tmax 99.46F WBC WNL SCr stable  Goal of Therapy:  Vancomycin trough level 15-20 mcg/ml  Appropriate antibiotic dosing for renal function and indication Eradication of infection  Plan:   Change Vancomycin to  IV q12h (aiming for higher trough level due to osteomyelitis).  Plan for Vancomycin trough level at steady state.  Monitor renal function, cultures, clinical course.    Greer Pickerel, PharmD, BCPS Pager: [phone] 03/30/2015 12:37 PM

## 2015-03-30 NOTE — Op Note (Signed)
#  [id number] 

## 2015-03-30 NOTE — Interval H&P Note (Signed)
OK for surgery PD 

## 2015-03-30 NOTE — Anesthesia Preprocedure Evaluation (Addendum)
Anesthesia Evaluation  Patient identified by MRN, date of birth, ID band Patient awake    Reviewed: Allergy & Precautions, NPO status , Patient's Chart, lab work & pertinent test results  Airway Mallampati: II   Neck ROM: full    Dental  (+)    Pulmonary neg pulmonary ROS,    breath sounds clear to auscultation       Cardiovascular negative cardio ROS   Rhythm:regular Rate:Normal     Neuro/Psych    GI/Hepatic   Endo/Other  diabetes, Type 2  Renal/GU      Musculoskeletal   Abdominal   Peds  Hematology   Anesthesia Other Findings   Reproductive/Obstetrics                            Anesthesia Physical Anesthesia Plan  ASA: II  Anesthesia Plan: MAC and Regional   Post-op Pain Management:    Induction: Intravenous  Airway Management Planned: Simple Face Mask  Additional Equipment:   Intra-op Plan:   Post-operative Plan:   Informed Consent: I have reviewed the patients History and Physical, chart, labs and discussed the procedure including the risks, benefits and alternatives for the proposed anesthesia with the patient or authorized representative who has indicated his/her understanding and acceptance.     Plan Discussed with: CRNA, Anesthesiologist and Surgeon  Anesthesia Plan Comments:         Anesthesia Quick Evaluation

## 2015-03-30 NOTE — H&P (View-Only) (Signed)
Marcene Corning, MD  Bryna Colander, PA-C  Elodia Florence, PA-C                                  [facility]                [address] [place], [place]  [address]   ORTHOPAEDIC CONSULTATION  Emilo Gras            MRN:  [id number] DOB/SEX:  09-26-1950/male     CHIEF COMPLAINT:  Painful great toes  HISTORY: Jeff Ferrell a 64 y.o. male with months of swelling and discoloration of left great toe. Has seen podiatrist for ingrown toenail but no treatment given per patient. Came to ED today with more drainage and pain.  Opposite great toe also hurts but no drainage.   PAST MEDICAL HISTORY: Patient Active Problem List   Diagnosis Date Noted  . Toe infection 03/29/2015  . Diabetic foot ulcer (HCC) 03/29/2015  . Diabetes mellitus (HCC) 03/29/2015  . Diabetic osteomyelitis (HCC) 03/29/2015   Past Medical History  Diagnosis Date  . Diabetes mellitus     regulated by diet  . Chronic back pain    Past Surgical History  Procedure Laterality Date  . Back surgery      screws & hardware to lower back     MEDICATIONS:   Current facility-administered medications:  .  0.9 %  sodium chloride infusion, 250 mL, Intravenous, PRN, Meredeth Ide, MD .  0.9 %  sodium chloride infusion, , Intravenous, Continuous, Meredeth Ide, MD, Last Rate: 75 mL/hr at 03/29/15 1603 .  acetaminophen (TYLENOL) tablet 650 mg, 650 mg, Oral, Q6H PRN **OR** acetaminophen (TYLENOL) suppository 650 mg, 650 mg, Rectal, Q6H PRN, Meredeth Ide, MD .  antiseptic oral rinse (CPC / CETYLPYRIDINIUM CHLORIDE 0.05%) solution 7 mL, 7 mL, Mouth Rinse, BID, Meredeth Ide, MD .  cyclobenzaprine (FLEXERIL) tablet 10 mg, 10 mg, Oral, QHS, Meredeth Ide, MD .  dicyclomine (BENTYL) capsule 10 mg, 10 mg, Oral, TID PRN, Meredeth Ide, MD, 10 mg at 03/29/15 1745 .  enoxaparin (LOVENOX) injection 40 mg, 40 mg, Subcutaneous, Q24H, Meredeth Ide, MD, 40 mg at 03/29/15 1745 .  finasteride (PROSCAR) tablet 5 mg, 5 mg, Oral, Daily,  Meredeth Ide, MD, 5 mg at 03/29/15 1745 .  insulin aspart (novoLOG) injection 0-9 Units, 0-9 Units, Subcutaneous, TID WC, Meredeth Ide, MD, 2 Units at 03/29/15 1745 .  insulin aspart protamine- aspart (NOVOLOG MIX 70/30) injection 10 Units, 10 Units, Subcutaneous, BID WC, Meredeth Ide, MD, 10 Units at 03/29/15 1746 .  ondansetron (ZOFRAN) tablet 4 mg, 4 mg, Oral, Q6H PRN **OR** ondansetron (ZOFRAN) injection 4 mg, 4 mg, Intravenous, Q6H PRN, Meredeth Ide, MD .  sodium chloride 0.9 % injection 3 mL, 3 mL, Intravenous, Q12H, Meredeth Ide, MD .  sodium chloride 0.9 % injection 3 mL, 3 mL, Intravenous, PRN, Meredeth Ide, MD .  tamsulosin (FLOMAX) capsule 0.4 mg, 0.4 mg, Oral, QPM, Meredeth Ide, MD, 0.4 mg at 03/29/15 1745 .  vancomycin (VANCOCIN) IVPB 1000 mg/200 mL premix, 1,000 mg, Intravenous, Q12H, Terri L Green, RPH  ALLERGIES:   Allergies  Allergen Reactions  . Penicillins Hives and Rash    Has patient had a PCN reaction causing immediate rash, facial/tongue/throat swelling, SOB or lightheadedness with hypotension: Yes Has patient had a PCN reaction causing severe  rash involving mucus membranes or skin necrosis: Yes   Has patient had a PCN reaction that required hospitalization No Has patient had a PCN reaction occurring within the last 10 years: No If all of the above answers are "NO", then may proceed with Cephalosporin use.     REVIEW OF SYSTEMS: REVIEWED IN DETAIL IN CHART  FAMILY HISTORY:  No family history on file.  SOCIAL HISTORY:   Social History  Substance Use Topics  . Smoking status: Never Smoker   . Smokeless tobacco: Not on file  . Alcohol Use: 3.6 oz/week    6 Cans of beer per week     Comment: daily     EXAMINATION: Vital signs in last 24 hours: Temp:  [97.9 F (36.6 C)] 97.9 F (36.6 C) (10/10 1500) Pulse Rate:  [117-130] 117 (10/10 1500) Resp:  [16-17] 17 (10/10 1500) BP: (87-138)/(53-78) 138/75 mmHg (10/10 1500) SpO2:  [98 %-100 %] 98 % (10/10  1500) Weight:  [184 lb 15.5 oz (83.9 kg)] 184 lb 15.5 oz (83.9 kg) (10/10 1500)  BP 138/75 mmHg  Pulse 117  Temp(Src) 97.9 F (36.6 C) (Oral)  Resp 17  Ht 5\' 9"  (1.753 m)  Wt 184 lb 15.5 oz (83.9 kg)  BMI 27.30 kg/m2  SpO2 98%  General Appearance:    Alert, cooperative, no distress, appears stated age  Head:    Normocephalic, without obvious abnormality, atraumatic  Eyes:    PERRL, conjunctiva/corneas clear, EOM's intact, fundi    benign, both eyes       Ears:    Normal TM's and external ear canals, both ears  Nose:   Nares normal, septum midline, mucosa normal, no drainage    or sinus tenderness  Throat:   Lips, mucosa, and tongue normal; teeth and gums normal  Neck:   Supple, symmetrical, trachea midline, no adenopathy;       thyroid:  No enlargement/tenderness/nodules; no carotid   bruit or JVD  Back:     Symmetric, no curvature, ROM normal, long scar, moderate tenderness  Lungs:     Clear to auscultation bilaterally, respirations unlabored  Chest wall:    No tenderness or deformity  Heart:    Regular rate and rhythm, S1 and S2 normal, no murmur, rub   or gallop  Abdomen:     Soft, non-tender, bowel sounds active all four quadrants,    no masses, no organomegaly  Genitalia:    Rectal:    Extremities:     Pulses:   2+ and symmetric all extremities  Skin:   Skin color, texture, turgor normal, no rashes or lesions  Lymph nodes:   Cervical, supraclavicular, and axillary nodes normal  Neurologic:   CNII-XII intact. Normal strength, sensation and reflexes      throughout    Musculoskeletal Exam:   Left great toe red and swollen.  Gray from PIP distal and black at tip. Yellow drainage with mild odor. Asensate from midfoot distal. Weakly palp pulse DP. Mild erythema to ankle and mild edema   DIAGNOSTIC STUDIES: Recent laboratory studies:  Recent Labs  03/29/15 1153  WBC 7.1  HGB 13.2  HCT 38.3*  PLT 253    Recent Labs  03/29/15 1153  NA 134*  K 4.2  CL 100*  CO2  23  BUN 7  CREATININE 0.65  GLUCOSE 254*  CALCIUM 8.9   No results found for: INR, PROTIME   Recent Radiographic Studies :  Dg Toe Great Left  03/29/2015  CLINICAL DATA:  Non healing wound for 1 week.  History diabetes.  EXAM: LEFT GREAT TOE  COMPARISON:  None.  FINDINGS: Seen best on the provided lateral radiograph is a wound involving the distal end of the great toe with associated scattered foci of subcutaneous emphysema and osteolysis involving the tuft of the great digit worrisome for osteomyelitis. No radiopaque foreign body. Joint spaces appear preserved. No dislocation. No definite fracture.  IMPRESSION: Findings worrisome for osteomyelitis involving the tuft of the great toe with associated overlying wound. Further evaluation could be performed with MRI as clinically indicated.   Electronically Signed   By: Simonne Come M.D.   On: 03/29/2015 14:04    ASSESSMENT:  Left great toe osteomyelitis   PLAN:  Best managed with amputation of great toe as infection involves bone.  Agree with IV vanco for now. Patient concerned as his father started with toe amputation which did not heal followed by BKA and AKA. Cannot guarantee that will not be his course. Will plan on surgery tomorrow late or Wednesday assuming he is cleared by medical team.  May eat dinner and breakfast for sure.   Natalie Leclaire G 03/29/2015, 7:17 PM

## 2015-03-30 NOTE — Anesthesia Postprocedure Evaluation (Signed)
Anesthesia Post Note  Patient: Jeff Ferrell  Procedure(s) Performed: Procedure(s) (LRB): AMPUTATION LEFT GREAT TOE (Left)  Anesthesia type: MAC  Patient location: PACU  Post pain: Pain level controlled and Adequate analgesia  Post assessment: Post-op Vital signs reviewed, Patient's Cardiovascular Status Stable and Respiratory Function Stable  Last Vitals:  Filed Vitals:   03/30/15 1710  BP: 143/81  Pulse: 100  Temp: 36.4 C  Resp: 14    Post vital signs: Reviewed and stable  Level of consciousness: awake, alert  and oriented  Complications: No apparent anesthesia complications

## 2015-03-30 NOTE — Progress Notes (Signed)
Pt had personal belongings to OR, money checked in w/ security.  Upon pt return, pt requested to have money brought back to room. All  Money verified w/ pt in room and documentation signed.

## 2015-03-31 DIAGNOSIS — M869 Osteomyelitis, unspecified: Secondary | ICD-10-CM

## 2015-03-31 DIAGNOSIS — E1169 Type 2 diabetes mellitus with other specified complication: Secondary | ICD-10-CM

## 2015-03-31 DIAGNOSIS — E440 Moderate protein-calorie malnutrition: Secondary | ICD-10-CM

## 2015-03-31 DIAGNOSIS — L97529 Non-pressure chronic ulcer of other part of left foot with unspecified severity: Secondary | ICD-10-CM

## 2015-03-31 DIAGNOSIS — E08621 Diabetes mellitus due to underlying condition with foot ulcer: Secondary | ICD-10-CM

## 2015-03-31 HISTORY — DX: Osteomyelitis, unspecified: M86.9

## 2015-03-31 LAB — GLUCOSE, CAPILLARY
GLUCOSE-CAPILLARY: 95 mg/dL (ref 65–99)
GLUCOSE-CAPILLARY: 99 mg/dL (ref 65–99)
GLUCOSE-CAPILLARY: 77 mg/dL (ref 65–99)
Glucose-Capillary: 153 mg/dL — ABNORMAL HIGH (ref 65–99)
Glucose-Capillary: 151 mg/dL — ABNORMAL HIGH (ref 65–99)
Glucose-Capillary: 130 mg/dL — ABNORMAL HIGH (ref 65–99)

## 2015-03-31 MED ORDER — INSULIN ASPART PROT & ASPART (70-30 MIX) 100 UNIT/ML ~~LOC~~ SUSP: 10.0000 [IU] | Freq: Every day | SUBCUTANEOUS | Status: DC

## 2015-03-31 MED ORDER — INSULIN ASPART PROT & ASPART (70-30 MIX) 100 UNIT/ML ~~LOC~~ SUSP
12.0000 [IU] | Freq: Every day | SUBCUTANEOUS | Status: DC
  Administered 2015-04-01: 12 [IU] via SUBCUTANEOUS

## 2015-03-31 NOTE — Care Management Note (Signed)
Case Management Note  Patient Details  Name: [name] MRN: [id number] Date of Birth: 04/16/1951  Subjective/Objective:  PT recc HHPT.Left [facility] [phone] to confirm who they will auth.  Await call back.[facility] [place] [facility] dme rep Lecretia aware.[facility] rep Kristen aware of possible referral. Await HHPT order.                  Action/Plan:d/c home w/HHC.   Expected Discharge Date:   (unknown)               Expected Discharge Plan:  Home w Home Health Services  In-House Referral:     Discharge planning Services  CM Consult  Post Acute Care Choice:    Choice offered to:     DME Arranged:    DME Agency:     HH Arranged:    HH Agency:     Status of Service:  In process, will continue to follow  Medicare Important Message Given:    Date Medicare IM Given:    Medicare IM give by:    Date Additional Medicare IM Given:    Additional Medicare Important Message give by:     If discussed at Long Length of Stay Meetings, dates discussed:    Additional Comments:  Lanier ClamMahabir, Ahniyah Giancola, RN 03/31/2015, 4:18 PM

## 2015-03-31 NOTE — Progress Notes (Signed)
[facility] PROGRESS NOTE    Progress Note   Parks Czajkowski ZOX:[id number] DOB: 21-May-1951 DOA: 03/29/2015 PCP: [name], MD   Brief Narrative:   Jeff Ferrell is an 64 y.o. male   Assessment/Plan:   Diabetic foot ulcer (HCC)/  Diabetic osteomyelitis (HCC): - Started empirically on IV antibiotics vancomycin, orthopedic surgery was consulted who recommended amputation of the left great toe performed on 03/31/2015. Continue IV antibiotics for 1 additional day  Diabetes mellitus (HCC) peripheral vascular disease: Continue 7030, DC sliding scale insulin.  Essential hypertension: Continue hydralazine.  BPH  continue Flomax.  Malnutrition of moderate degree    DVT Prophylaxis - Lovenox ordered.  Family Communication: none Disposition Plan: Home when stable. Code Status:     Code Status Orders        Start     Ordered   03/30/15 2349  Full code   Continuous     03/30/15 2348        IV Access:    Peripheral IV   Procedures and diagnostic studies:   Dg Toe Great Left  03/29/2015  CLINICAL DATA:  Non healing wound for 1 week.  History diabetes. EXAM: LEFT GREAT TOE COMPARISON:  None. FINDINGS: Seen best on the provided lateral radiograph is a wound involving the distal end of the great toe with associated scattered foci of subcutaneous emphysema and osteolysis involving the tuft of the great digit worrisome for osteomyelitis. No radiopaque foreign body. Joint spaces appear preserved. No dislocation. No definite fracture. IMPRESSION: Findings worrisome for osteomyelitis involving the tuft of the great toe with associated overlying wound. Further evaluation could be performed with MRI as clinically indicated. Electronically Signed   By: Simonne Come M.D.   On: 03/29/2015 14:04     Medical Consultants:    None.  Anti-Infectives:   Anti-infectives    Start     Dose/Rate Route Frequency Ordered Stop   03/30/15 2000  vancomycin (VANCOCIN) 1,250  mg in sodium chloride 0.9 % 250 mL IVPB     1,250 mg 166.7 mL/hr over 90 Minutes Intravenous Every 12 hours 03/30/15 1238     03/29/15 2200  vancomycin (VANCOCIN) IVPB 1000 mg/200 mL premix  Status:  Discontinued     1,000 mg 200 mL/hr over 60 Minutes Intravenous Every 12 hours 03/29/15 1609 03/30/15 1238   03/29/15 1345  vancomycin (VANCOCIN) IVPB 1000 mg/200 mL premix     1,000 mg 200 mL/hr over 60 Minutes Intravenous  Once 03/29/15 1340 03/29/15 1514      Subjective:    Jake Shark Lauro no complains.  Objective:    Filed Vitals:   03/30/15 1701 03/30/15 1710 03/30/15 1725 03/30/15 2110  BP:  143/81 139/77 157/90  Pulse: 94 100 95 98  Temp:  97.6 F (36.4 C) 97.6 F (36.4 C) 98.4 F (36.9 C)  TempSrc:   Oral Oral  Resp: Height:      Weight:      SpO2: 100% 100% 100% 100%    Intake/Output Summary (Last 24 hours) at 03/31/15 1144 Last data filed at 03/31/15 0512  Gross per 24 hour  Intake    700 ml  Output    865 ml  Net   -165 ml   Filed Weights   03/29/15 1500  Weight: 83.9 kg (184 lb 15.5 oz)    Exam: Gen:  NAD Cardiovascular:  RRR, No M/R/G Chest and lungs:   CTAB Abdomen:  Abdomen soft, NT/ND, + BS Extremities:  No C/E/C  Data Reviewed:    Labs: Basic Metabolic Panel:  Recent Labs Lab 03/29/15 1153 03/30/15 0610  NA 134* 135  K 4.2 3.9  CL 100* 102  CO2 23 26  GLUCOSE 254* 88  BUN 7 8  CREATININE 0.65 0.59*  CALCIUM 8.9 9.0   GFR Estimated Creatinine Clearance: 92.1 mL/min (by C-G formula based on Cr of 0.59). Liver Function Tests:  Recent Labs Lab 03/30/15 0610  AST 40  ALT 18  ALKPHOS 182*  BILITOT 1.0  PROT 7.1  ALBUMIN 3.2*   No results for input(s): LIPASE, AMYLASE in the last 168 hours. No results for input(s): AMMONIA in the last 168 hours. Coagulation profile No results for input(s): INR, PROTIME in the last 168 hours.  CBC:  Recent Labs Lab 03/29/15 1153 03/30/15 0610  WBC 7.1 7.1  HGB 13.2  13.3  HCT 38.3* 39.9  MCV 91.0 91.9  PLT 253 222   Cardiac Enzymes: No results for input(s): CKTOTAL, CKMB, CKMBINDEX, TROPONINI in the last 168 hours. BNP (last 3 results) No results for input(s): PROBNP in the last 8760 hours. CBG:  Recent Labs Lab 03/30/15 1429 03/30/15 1528 03/30/15 1637 03/30/15 2121 03/31/15 0720  GLUCAP 95 99 110* 97 153*   D-Dimer: No results for input(s): DDIMER in the last 72 hours. Hgb A1c:  Recent Labs  03/29/15 1153  HGBA1C 7.6*   Lipid Profile: No results for input(s): CHOL, HDL, LDLCALC, TRIG, CHOLHDL, LDLDIRECT in the last 72 hours. Thyroid function studies: No results for input(s): TSH, T4TOTAL, T3FREE, THYROIDAB in the last 72 hours.  Invalid input(s): FREET3 Anemia work up: No results for input(s): VITAMINB12, FOLATE, FERRITIN, TIBC, IRON, RETICCTPCT in the last 72 hours. Sepsis Labs:  Recent Labs Lab 03/29/15 1153 03/29/15 1317 03/30/15 0610  WBC 7.1  --  7.1  LATICACIDVEN  --  1.88  --    Microbiology Recent Results (from the past 240 hour(s))  Culture, blood (routine x 2)     Status: None (Preliminary result)   Collection Time: 03/29/15  1:10 PM  Result Value Ref Range Status   Specimen Description BLOOD RIGHT ANTECUBITAL  Final   Special Requests BOTTLES DRAWN AEROBIC AND ANAEROBIC 5CC   Final   Culture   Final    NO GROWTH < 24 HOURS [facility]    Report Status PENDING  Incomplete  Culture, blood (routine x 2)     Status: None (Preliminary result)   Collection Time: 03/29/15  1:15 PM  Result Value Ref Range Status   Specimen Description BLOOD LEFT FOREARM  Final   Special Requests BOTTLES DRAWN AEROBIC AND ANAEROBIC 5CC   Final   Culture   Final    NO GROWTH < 24 HOURS Performed at [facility]    Report Status PENDING  Incomplete  MRSA PCR Screening     Status: None   Collection Time: 03/30/15 12:06 AM  Result Value Ref Range Status   MRSA by PCR NEGATIVE NEGATIVE Final     Comment:        The GeneXpert MRSA Assay (FDA approved for NASAL specimens only), is one component of a comprehensive MRSA colonization surveillance program. It is not intended to diagnose MRSA infection nor to guide or monitor treatment for MRSA infections.      Medications:   . antiseptic oral rinse  7 mL Mouth Rinse BID  . cyclobenzaprine  10 mg Oral QHS  . enoxaparin (LOVENOX) injection  40 mg Subcutaneous Q24H  . finasteride  5 mg Oral Daily  . insulin aspart  0-9 Units Subcutaneous TID WC  . insulin aspart protamine- aspart  10 Units Subcutaneous BID WC  . sodium chloride  3 mL Intravenous Q12H  . tamsulosin  0.4 mg Oral QPM  . vancomycin  1,250 mg Intravenous Q12H   Continuous Infusions: . sodium chloride 75 mL/hr at 03/30/15 2129    Time spent: 15 min   LOS: 2 days   Marinda Elk  [facility] Pager [phone]  *Please refer to [email or web address], password [email or web address] to get updated schedule on who will round on this patient, as hospitalists switch teams weekly. If 7PM-7AM, please contact night-coverage at [email or web address], password [email or web address] for any overnight needs.  03/31/2015, 11:44 AM

## 2015-03-31 NOTE — Progress Notes (Signed)
PT Cancellation Note  Patient Details Name: Jeff FerrariHarold Ferrell MRN: [id number] DOB: 05/29/1951   Cancelled Treatment:    Reason Eval/Treat Not Completed: Order received. Chart reviewed. Note WBAT status in chart and order for post op shoe. Post op shoe not in room as of yet. Will await delivery of shoe by ortho tech before proceeding with eval. Will check back later today. Thanks.    Rebeca AlertJannie Kellon Chalk, MPT Pager: [phone]

## 2015-03-31 NOTE — Progress Notes (Signed)
Subjective: 1 Day Post-Op Procedure(s) (LRB): AMPUTATION LEFT GREAT TOE (Left)   Patient is resting comfortably in bed. He is afebrile. He is not having any significant pain.  Activity level:  wbat in post op shoe Diet tolerance:  ok Voiding:  ok Patient reports pain as mild.    Objective: Vital signs in last 24 hours: Temp:  [97.5 F (36.4 C)-98.4 F (36.9 C)] 98.4 F (36.9 C) (10/11 2110) Pulse Rate:  [94-107] 98 (10/11 2110) Resp:  [14-20] 20 (10/11 2110) BP: (138-157)/(77-90) 157/90 mmHg (10/11 2110) SpO2:  [99 %-100 %] 100 % (10/11 2110)  Labs:  Recent Labs  03/29/15 1153 03/30/15 0610  HGB 13.2 13.3    Recent Labs  03/29/15 1153 03/30/15 0610  WBC 7.1 7.1  RBC 4.21* 4.34  HCT 38.3* 39.9  PLT 253 222    Recent Labs  03/29/15 1153 03/30/15 0610  NA 134* 135  K 4.2 3.9  CL 100* 102  CO2 23 26  BUN 7 8  CREATININE 0.65 0.59*  GLUCOSE 254* 88  CALCIUM 8.9 9.0   No results for input(s): LABPT, INR in the last 72 hours.  Physical Exam:  Neurologically intact ABD soft Neurovascular intact Incision: dressing C/D/I and scant drainage No cellulitis present Compartment soft  Assessment/Plan:  1 Day Post-Op Procedure(s) (LRB): AMPUTATION LEFT GREAT TOE (Left) Advance diet Up with therapy Plan for discharge tomorrow if doing well and cleared by medicine and PT. Weightbearing as tolerated in his post op shoe. Reniforce bandage if needed. I will change dressing tomorrow and remove drain. We will most likely send him home on oral antibiotics. We will continue to follow him closely to monitor his progress and healing. We greatly appreciate medical management.  Marrissa Dai, Ginger OrganNDREW PAUL 03/31/2015, 1:36 PM

## 2015-03-31 NOTE — Care Management Note (Signed)
Case Management Note  Patient Details  Name: Jeff Ferrell MRN: [id number] Date of Birth: 11/26/1950  Subjective/Objective: 64 y/o m admitted w/l great toe infection. GN:[id number]:Diabetic foot ulcer.POD#1 amputation of l great toe.Has VA insurance-TC [facility] rep Jeff Ferrell #[phone]-aware of admission-Non service connection for hospital-pcp-Dr. Christella ScheuermannWymer Ferrell-[phone].Marland Kitchen. [facility] contact-Jeff Ferrell [phone].since already had sx, & may d/c in am, [place] [facility].If home health or SNF needed contact [facility] HunJennifer Ferrell to assist w/providers.CM/CSW following for PT recc.  Received TC from PT-needing WBAT status, & if p/o shoes needed-Nsg notified.                   Action/Plan:Monitor progress for d/c plans.   Expected Discharge Date:   (unknown)               Expected Discharge Plan:  Home w Home Health Services  In-House Referral:     Discharge planning Services  CM Consult  Post Acute Care Choice:    Choice offered to:     DME Arranged:    DME Agency:     HH Arranged:    HH Agency:     Status of Service:  In process, will continue to follow  Medicare Important Message Given:    Date Medicare IM Given:    Medicare IM give by:    Date Additional Medicare IM Given:    Additional Medicare Important Message give by:     If discussed at Long Length of Stay Meetings, dates discussed:    Additional Comments:  Jeff Ferrell, Jeff Bramlett, RN 03/31/2015, 11:45 AM

## 2015-03-31 NOTE — Evaluation (Signed)
Physical Therapy Evaluation Patient Details Name: Jeff FerrariHarold Colbaugh MRN: [id number] DOB: 11/04/1950 Today's Date: 03/31/2015   History of Present Illness  64 yo male s/p L great toe amputation 03/30/15 due to osteomyelitis. Hx of DM, chronic back pain  Clinical Impression  On eval, pt was Min assist for mobility-walked ~300 feet with RW. Pt is very unsteady and at risk for falls due to impaired balance. Recommend HHPT and RW use. Will benefit from continue rehab for balance training and safety.    Follow Up Recommendations Home health PT    Equipment Recommendations  Rolling walker with 5" wheels (pt states he can borrow one???)    Recommendations for Other Services       Precautions / Restrictions Precautions Precautions: Fall Required Braces or Orthoses: Other Brace/Splint Other Brace/Splint: post op shoe Restrictions Weight Bearing Restrictions: No LLE Weight Bearing: Weight bearing as tolerated      Mobility  Bed Mobility Overal bed mobility: Modified Independent             General bed mobility comments: HOB elevated. Pt had to sit EOB for a few minutes due to back pain.   Transfers Overall transfer level: Needs assistance Equipment used: Rolling walker (2 wheeled) Transfers: Sit to/from Stand Sit to Stand: From elevated surface;Min assist         General transfer comment: VCs safety, hand placement. Assist to rise, stabilize, control descent. Unsteady  Ambulation/Gait Ambulation/Gait assistance: Min assist Ambulation Distance (Feet): 250 Feet Assistive device: Rolling walker (2 wheeled) Gait Pattern/deviations: Step-to pattern;Antalgic     General Gait Details: Assist to stabilize pt throughout ambulation distance. VCs safety. Very unsteady.   Stairs            Wheelchair Mobility    Modified Rankin (Stroke Patients Only)       Balance Overall balance assessment: Needs assistance         Standing balance support: Bilateral upper  extremity supported;During functional activity Standing balance-Leahy Scale: Poor                               Pertinent Vitals/Pain Pain Assessment: 0-10 Pain Score: 6  Pain Location: back pain.  Pain Descriptors / Indicators: Sore;Aching;Spasm Pain Intervention(s): Monitored during session    Home Living Family/patient expects to be discharged to:: Private residence Living Arrangements: Alone   Type of Home: Apartment Home Access: Level entry     Home Layout: One level Home Equipment: None Additional Comments: pt states he can borrow a walker    Prior Function Level of Independence: Independent               Hand Dominance        Extremity/Trunk Assessment   Upper Extremity Assessment: Overall WFL for tasks assessed           Lower Extremity Assessment: LLE deficits/detail   LLE Deficits / Details: dressing L foot  Cervical / Trunk Assessment: Normal  Communication   Communication: No difficulties  Cognition Arousal/Alertness: Awake/alert Behavior During Therapy: WFL for tasks assessed/performed Overall Cognitive Status: Within Functional Limits for tasks assessed                      General Comments      Exercises        Assessment/Plan    PT Assessment Patient needs continued PT services  PT Diagnosis Difficulty walking;Abnormality of gait   PT Problem  List Decreased activity tolerance;Decreased balance;Decreased mobility;Pain;Decreased knowledge of use of DME;Decreased safety awareness  PT Treatment Interventions DME instruction;Gait training;Functional mobility training;Therapeutic activities;Patient/family education;Balance training;Therapeutic exercise   PT Goals (Current goals can be found in the Care Plan section) Acute Rehab PT Goals Patient Stated Goal: home PT Goal Formulation: With patient Time For Goal Achievement: 04/14/15 Potential to Achieve Goals: Good    Frequency Min 3X/week   Barriers to  discharge        Co-evaluation               End of Session Equipment Utilized During Treatment: Gait belt Activity Tolerance: Patient limited by pain (back pain) Patient left: in bed;with nursing/sitter in room;with call bell/phone within reach (EOB with nurse Onalee Hua))           Time: 5366-4403 PT Time Calculation (min) (ACUTE ONLY): 12 min   Charges:   PT Evaluation $Initial PT Evaluation Tier I: 1 Procedure     PT G Codes:        Rebeca Alert, MPT Pager: [phone]

## 2015-03-31 NOTE — Op Note (Signed)
NAMJanann August:  Ferrell, Jeff             ACCOUNT NO.:  [id number]  MEDICAL RECORD NO.:  [id number]  LOCATION:  1516                         [facility]  PHYSICIAN:  Lubertha Basqueeter G. Chaunta Bejarano, M.D.DATE OF BIRTH:  01/31/1951  DATE OF PROCEDURE:  03/30/2015 DATE OF DISCHARGE:                              OPERATIVE REPORT   PREOPERATIVE DIAGNOSIS:  Left toe osteomyelitis.  POSTOPERATIVE DIAGNOSIS:  Left toe osteomyelitis.  PROCEDURE:  Left great toe amputation.  ANESTHESIA:  Block and MAC.  ATTENDING SURGEON:  Lubertha Basqueeter G. Jerl Santosalldorf, M.D.  INDICATION FOR PROCEDURE:  The patient is a 64 year old man with a long history of brittle diabetes.  He has had toe ulcers in the past, which has responded to oral antibiotics, but at this point, he has one, which is not responding.  He has drainage and ulceration and osteomyelitis by x-ray.  He is offered amputation of the great toe at this point. Informed operative consent was obtained after discussion of possible complications including reaction to anesthesia, continued infection, and need for more proximal amputation.  SUMMARY OF FINDINGS AND PROCEDURE:  Under ankle block and MAC, a left great toe amputation was performed through the base of the proximal phalanx.  This specimen was sent to pathology.  It was closed primarily and admitted back to Medicine.  DESCRIPTION OF PROCEDURE:  The patient was taken to the operating suite where ankle block and MAC were applied.  He was positioned supine and prepped and draped in normal sterile fashion.  He was already on some perioperative vancomycin and we used that as a perioperative antibiotic. The left leg was elevated, exsanguinated, and tourniquet inflated about the thigh.  After appropriate time-out, I performed a left great toe amputation.  I made a circumferential incision distal to the MTP joint. Dissection was carried down to bone.  We amputated at the IP joint site. The specimen was sent to pathology.   This did have pus emanating and a foul odor.  I then dissected along the proximal phalanx and used a saw to make cut near the base of the proximal phalanx leaving it to the flexor tendon attached.  The base of this bone was then beveled slightly with a rongeur.  I irrigated with copious amounts of saline.  The tourniquet was deflated and skin edges bled well.  Again, I irrigated followed by reapproximation of skin with vertical mattresses sutures of 2-0 nylon.  I did place a Penrose drain in the wound.  Adaptic was applied followed by dry gauze and loose Coban wrap.  Estimated blood loss and fluids as well as accurate tourniquet time can obtained from anesthesia records.  DISPOSITION:  The patient was taken to recovery room in stable addition. He was to be admitted back to the Medicine Service for appropriate postop care.     Lubertha BasquePeter G. Jerl Santosalldorf, M.D.     PGD/MEDQ  D:  03/30/2015  T:  03/31/2015  Job:  [id number]

## 2015-04-01 LAB — GLUCOSE, CAPILLARY
GLUCOSE-CAPILLARY: 78 mg/dL (ref 65–99)
Glucose-Capillary: 108 mg/dL — ABNORMAL HIGH (ref 65–99)

## 2015-04-01 MED ORDER — INSULIN ASPART PROT & ASPART (70-30 MIX) 100 UNIT/ML PEN: 22.0000 [IU] | PEN_INJECTOR | Freq: Every day | SUBCUTANEOUS | Status: DC

## 2015-04-01 MED ORDER — INSULIN ASPART PROT & ASPART (70-30 MIX) 100 UNIT/ML ~~LOC~~ SUSP: 30.0000 [IU] | SUBCUTANEOUS | Status: DC

## 2015-04-01 MED ORDER — INSULIN ASPART PROT & ASPART (70-30 MIX) 100 UNIT/ML ~~LOC~~ SUSP: 40.0000 [IU] | Freq: Every day | SUBCUTANEOUS | Status: DC

## 2015-04-01 MED ORDER — CIPROFLOXACIN HCL 250 MG PO TABS: 250.0000 mg | ORAL_TABLET | Freq: Two times a day (BID) | ORAL | Status: DC

## 2015-04-01 MED ORDER — INSULIN ASPART PROT & ASPART (70-30 MIX) 100 UNIT/ML PEN: 10.0000 [IU] | PEN_INJECTOR | Freq: Every day | SUBCUTANEOUS | Status: DC

## 2015-04-01 MED ORDER — VITAMINS A & D EX OINT
TOPICAL_OINTMENT | CUTANEOUS | Status: AC
  Filled 2015-04-01: qty 5

## 2015-04-01 MED ORDER — DOXYCYCLINE HYCLATE 100 MG PO TABS
100.0000 mg | ORAL_TABLET | Freq: Two times a day (BID) | ORAL | Status: DC
Start: 2015-04-01 — End: 2015-06-23

## 2015-04-01 NOTE — Progress Notes (Signed)
Subjective: 2 Days Post-Op Procedure(s) (LRB): AMPUTATION LEFT GREAT TOE (Left)  Activity level:  wbat in post op shoe Diet tolerance:  ok Voiding:  ok Patient reports pain as 0 on 0-10 scale.    Objective: Vital signs in last 24 hours: Temp:  [98 F (36.7 C)-99.1 F (37.3 C)] 98.4 F (36.9 C) (10/13 0450) Pulse Rate:  [101-109] 109 (10/13 0450) Resp:  [18-20] 20 (10/13 0450) BP: (114-139)/(65-88) 114/65 mmHg (10/13 0450) SpO2:  [99 %-100 %] 99 % (10/13 0450)  Labs:  Recent Labs  03/29/15 1153 03/30/15 0610  HGB 13.2 13.3    Recent Labs  03/29/15 1153 03/30/15 0610  WBC 7.1 7.1  RBC 4.21* 4.34  HCT 38.3* 39.9  PLT 253 222    Recent Labs  03/29/15 1153 03/30/15 0610  NA 134* 135  K 4.2 3.9  CL 100* 102  CO2 23 26  BUN 7 8  CREATININE 0.65 0.59*  GLUCOSE 254* 88  CALCIUM 8.9 9.0   No results for input(s): LABPT, INR in the last 72 hours.  Physical Exam:  Neurologically intact ABD soft Neurovascular intact Incision: no drainage No cellulitis present Compartment soft  Assessment/Plan:  2 Days Post-Op Procedure(s) (LRB): AMPUTATION LEFT GREAT TOE (Left) Up with therapy  Bandage was changed today. Plan for discharge today if doing well and cleared by medicine and PT. Weightbearing as tolerated in his post op shoe. Reniforce bandage if needed. He will follow up with us on Monday in clinic for bandage change. We recommend 2 weeks of oral doxy upon discharge to help clear residual chance of infection and to prevent further infection. We will continue to follow him closely to monitor his progress and healing. We greatly appreciate medical management.  Telsa Dillavou, Ginger OrganNDREW PAUL 04/01/2015, 9:44 AM

## 2015-04-01 NOTE — Care Management Note (Signed)
Case Management Note  Patient Details  Name: Jeff Ferrell MRN: [id number] Date of Birth: 01/23/1951  Subjective/Objective:   [facility]ed w/confirmation to [phone] H&P, HHPT orders so they can check to get auth for [facility]risten rep aware of [facility] process for auth prior to accepting case.Patient made aware of [facility] process.[facility] Sande RivesLaura Kurn [phone] @ [facility] for [facility] if [facility] accepted.   [facility] to inform of who will provide HHPT.             Action/Plan:d/c home w/[facility] will f[facility].   Expected Discharge Date:   (unknown)               Expected Discharge Plan:  Home w Home Health Services  In-House Referral:     Discharge planning Services  CM Consult  Post Acute Care Choice:    Choice offered to:  Patient  DME Arranged:    DME Agency:     HH Arranged:  PT, OT HH Agency:  [facility]  Status of Service:  Completed, signed off  Medicare Important Message Given:    Date Medicare IM Given:    Medicare IM give by:    Date Additional Medicare IM Given:    Additional Medicare Important Message give by:     If discussed at Long Length of Stay Meetings, dates discussed:    Additional Comments:  Lanier ClamMahabir, Anju Sereno, RN 04/01/2015, 11:37 AM

## 2015-04-01 NOTE — Discharge Summary (Signed)
Physician Discharge Summary  Jeff Ferrell ZOX:[id number] DOB: May 02, 1951 DOA: 03/29/2015  PCP: [name], MD  Admit date: 03/29/2015 Discharge date: 04/01/2015  Time spent: 35 minutes  Recommendations for Outpatient Follow-up:  1. Follow-up with primary care doctor as an outpatient in one week titrate insulin needed, his last hemoglobin A1c was 7.6.    Discharge Diagnoses:  Active Problems:   Toe infection   Diabetic foot ulcer (HCC)   Diabetes mellitus (HCC)   Diabetic osteomyelitis (HCC)   Malnutrition of moderate degree   Toe osteomyelitis, left (HCC)   Discharge Condition: stable  Diet recommendation: carb modified  Filed Weights   03/29/15 1500  Weight: 83.9 kg (184 lb 15.5 oz)    History of present illness:  64 year old male with past medical history of diabetes and chronic back pain that comes in for worsening left toe swelling the patient notices swelling about 2 weeks and also yellow colored discharge so he came to the ED.  Hospital Course:  Diabetic foot ulcer and osteomyelitis: He was started empirically on IV vancomycin orthopedic surgery was consulted who recommended amputation of the left great toe performed on 03/31/2015 History and transition to oral doxycycline and Cipro for 2 weeks and he will follow-up with orthopedic surgery as an outpatient in 2 weeks.  Diabetes mellitus with peripheral vascular disease: No changes were made to his insulin. His A1c was 7.6 he will follow-up with his primary care doctor in 1 week and will consider titrating insulin as needed.  Essential hypertension: No changes were made.  Moderate degree of protein caloric malnutrition  Procedures:  Status post agitation of the left great toe and 03/31/2015.    Consultations:  orthopedics  Discharge Exam: Filed Vitals:   04/01/15 0944  BP: 105/68  Pulse: 113  Temp: 98.3 F (36.8 C)  Resp: 15    General: A&O x3 Cardiovascular: RRR Respiratory: good air  movement CTA B/L  Discharge Instructions   Discharge Instructions    Diet - low sodium heart healthy    Complete by:  As directed      Increase activity slowly    Complete by:  As directed           Current Discharge Medication List    START taking these medications   Details  ciprofloxacin (CIPRO) 250 MG tablet Take 1 tablet (250 mg total) by mouth 2 (two) times daily. Qty: 28 tablet, Refills: 0    doxycycline (VIBRA-TABS) 100 MG tablet Take 1 tablet (100 mg total) by mouth 2 (two) times daily. Qty: 28 tablet, Refills: 0    insulin aspart protamine - aspart (NOVOLOG 70/30 MIX) (70-30) 100 UNIT/ML FlexPen Inject 0.22 mLs (22 Units total) into the skin daily with supper. Qty: 15 mL, Refills: 11      CONTINUE these medications which have CHANGED   Details  insulin aspart protamine- aspart (NOVOLOG MIX 70/30) (70-30) 100 UNIT/ML injection Inject 0.4 mLs (40 Units total) into the skin daily with breakfast. Takes 40 units with breakfast Qty: 10 mL, Refills: 11      CONTINUE these medications which have NOT CHANGED   Details  cyclobenzaprine (FLEXERIL) 10 MG tablet Take 10 mg by mouth at bedtime.     dicyclomine (BENTYL) 10 MG capsule Take 10 mg by mouth 3 (three) times daily as needed for spasms.     finasteride (PROSCAR) 5 MG tablet Take 5 mg by mouth daily.    glipiZIDE (GLUCOTROL) 10 MG tablet Take 10 mg by mouth every other  day.     naproxen sodium (ANAPROX) 220 MG tablet Take 440 mg by mouth 2 (two) times daily with a meal.    Vitamin D, Ergocalciferol, (DRISDOL) 50000 UNITS CAPS capsule Take 50,000 Units by mouth every Sunday.     tamsulosin (FLOMAX) 0.4 MG CAPS capsule Take 0.4 mg by mouth every evening.       STOP taking these medications     nitrofurantoin, macrocrystal-monohydrate, (MACROBID) 100 MG capsule        Allergies  Allergen Reactions  . Penicillins Hives and Rash    Has patient had a PCN reaction causing immediate rash, facial/tongue/throat  swelling, SOB or lightheadedness with hypotension: Yes Has patient had a PCN reaction causing severe rash involving mucus membranes or skin necrosis: Yes   Has patient had a PCN reaction that required hospitalization No Has patient had a PCN reaction occurring within the last 10 years: No If all of the above answers are "NO", then may proceed with Cephalosporin use.    Follow-up Information    Follow up with Velna Ochs, MD. Schedule an appointment as soon as possible for a visit on 04/12/2015.   Specialty:  Orthopedic Surgery   Contact information:   [address] [place] [address] [phone]        The results of significant diagnostics from this hospitalization (including imaging, microbiology, ancillary and laboratory) are listed below for reference.    Significant Diagnostic Studies: Dg Toe Great Left  03/29/2015  CLINICAL DATA:  Non healing wound for 1 week.  History diabetes. EXAM: LEFT GREAT TOE COMPARISON:  None. FINDINGS: Seen best on the provided lateral radiograph is a wound involving the distal end of the great toe with associated scattered foci of subcutaneous emphysema and osteolysis involving the tuft of the great digit worrisome for osteomyelitis. No radiopaque foreign body. Joint spaces appear preserved. No dislocation. No definite fracture. IMPRESSION: Findings worrisome for osteomyelitis involving the tuft of the great toe with associated overlying wound. Further evaluation could be performed with MRI as clinically indicated. Electronically Signed   By: Simonne Come M.D.   On: 03/29/2015 14:04    Microbiology: Recent Results (from the past 240 hour(s))  Culture, blood (routine x 2)     Status: None (Preliminary result)   Collection Time: 03/29/15  1:10 PM  Result Value Ref Range Status   Specimen Description BLOOD RIGHT ANTECUBITAL  Final   Special Requests BOTTLES DRAWN AEROBIC AND ANAEROBIC 5CC   Final   Culture   Final    NO GROWTH 2  DAYS Performed at [facility]    Report Status PENDING  Incomplete  Culture, blood (routine x 2)     Status: None (Preliminary result)   Collection Time: 03/29/15  1:15 PM  Result Value Ref Range Status   Specimen Description BLOOD LEFT FOREARM  Final   Special Requests BOTTLES DRAWN AEROBIC AND ANAEROBIC 5CC   Final   Culture   Final    NO GROWTH 2 DAYS Performed at [facility]    Report Status PENDING  Incomplete  MRSA PCR Screening     Status: None   Collection Time: 03/30/15 12:06 AM  Result Value Ref Range Status   MRSA by PCR NEGATIVE NEGATIVE Final    Comment:        The GeneXpert MRSA Assay (FDA approved for NASAL specimens only), is one component of a comprehensive MRSA colonization surveillance program. It is not intended to diagnose MRSA infection nor  to guide or monitor treatment for MRSA infections.      Labs: Basic Metabolic Panel:  Recent Labs Lab 03/29/15 1153 03/30/15 0610  NA 134* 135  K 4.2 3.9  CL 100* 102  CO2 23 26  GLUCOSE 254* 88  BUN 7 8  CREATININE 0.65 0.59*  CALCIUM 8.9 9.0   Liver Function Tests:  Recent Labs Lab 03/30/15 0610  AST 40  ALT 18  ALKPHOS 182*  BILITOT 1.0  PROT 7.1  ALBUMIN 3.2*   No results for input(s): LIPASE, AMYLASE in the last 168 hours. No results for input(s): AMMONIA in the last 168 hours. CBC:  Recent Labs Lab 03/29/15 1153 03/30/15 0610  WBC 7.1 7.1  HGB 13.2 13.3  HCT 38.3* 39.9  MCV 91.0 91.9  PLT 253 222   Cardiac Enzymes: No results for input(s): CKTOTAL, CKMB, CKMBINDEX, TROPONINI in the last 168 hours. BNP: BNP (last 3 results) No results for input(s): BNP in the last 8760 hours.  ProBNP (last 3 results) No results for input(s): PROBNP in the last 8760 hours.  CBG:  Recent Labs Lab 03/31/15 0720 03/31/15 1203 03/31/15 1622 03/31/15 2140 04/01/15 0756  GLUCAP 153* 77 151* 130* 108*       Signed:  Marinda ElkFELIZ ORTIZ, Jordyn Hofacker  [facility] 04/01/2015, 10:13 AM

## 2015-04-01 NOTE — Progress Notes (Signed)
Physical Therapy Treatment Patient Details Name: Jeff FerrariHarold Breshears MRN: [id number] DOB: 06/05/1951 Today's Date: 04/01/2015    History of Present Illness 64 yo male s/p L great toe amputation 03/30/15 due to osteomyelitis. Hx of DM, chronic back pain    PT Comments    Progressing with mobility. Small issue with pt adjusting to shoe. Cues for pt to move cautiously and carefully since front of shoe does intermittently catch during swing through. Made small adjustment to foot position and straps. Continue to recommend HHPT and RW use.   Follow Up Recommendations  Home health PT     Equipment Recommendations   (pt states he already has RW)    Recommendations for Other Services       Precautions / Restrictions Precautions Precautions: Fall Required Braces or Orthoses: Other Brace/Splint Other Brace/Splint: post op shoe Restrictions Weight Bearing Restrictions: No LLE Weight Bearing: Weight bearing as tolerated    Mobility  Bed Mobility Overal bed mobility: Modified Independent                Transfers Overall transfer level: Needs assistance Equipment used: Rolling walker (2 wheeled) Transfers: Sit to/from Stand Sit to Stand: Supervision         General transfer comment: for safety  Ambulation/Gait Ambulation/Gait assistance: Min guard Ambulation Distance (Feet): 250 Feet Assistive device: Rolling walker (2 wheeled) Gait Pattern/deviations: Step-through pattern;Steppage;Decreased dorsiflexion - left     General Gait Details: close guard for safety. Pt feels front of shoe is catching during swing through phase of gait-this is occurring intermittently. Attempted to make adjustment to position of shoe and straps. This is causing pt to use steppage gait pattern on L. Cues for pt to be cautious and aware of this.    Stairs            Wheelchair Mobility    Modified Rankin (Stroke Patients Only)       Balance           Standing balance support:  During functional activity Standing balance-Leahy Scale: Fair                      Cognition Arousal/Alertness: Awake/alert Behavior During Therapy: WFL for tasks assessed/performed Overall Cognitive Status: Within Functional Limits for tasks assessed                      Exercises      General Comments        Pertinent Vitals/Pain Pain Assessment: 0-10 Pain Score: 6  Pain Location: back pain-chronic Pain Descriptors / Indicators: Aching;Sore;Spasm Pain Intervention(s): Monitored during session    Home Living                      Prior Function            PT Goals (current goals can now be found in the care plan section) Progress towards PT goals: Progressing toward goals    Frequency  Min 3X/week    PT Plan Current plan remains appropriate    Co-evaluation             End of Session Equipment Utilized During Treatment: Gait belt Activity Tolerance: Patient tolerated treatment well Patient left: in bed;with call bell/phone within reach     Time: 1055-1108 PT Time Calculation (min) (ACUTE ONLY): 13 min  Charges:  $Gait Training: 8-22 mins  G Codes:      Weston Anna, MPT Pager: [phone]

## 2015-04-01 NOTE — Progress Notes (Signed)
Discharge instructions given to pt, verbalized understanding. Left the unit in stable condition. 

## 2015-04-03 LAB — CULTURE, BLOOD (ROUTINE X 2)
CULTURE: NO GROWTH
Culture: NO GROWTH

## 2015-06-11 ENCOUNTER — Emergency Department (HOSPITAL_COMMUNITY): Payer: Medicare Other

## 2015-06-11 ENCOUNTER — Inpatient Hospital Stay (HOSPITAL_COMMUNITY)
Admission: EM | Admit: 2015-06-11 | Discharge: 2015-06-23 | DRG: 481 | Disposition: A | Discharge status: Skilled Nursing Facility | Payer: Medicare Other | Attending: Internal Medicine | Admitting: Internal Medicine

## 2015-06-11 ENCOUNTER — Encounter (HOSPITAL_COMMUNITY): Payer: Self-pay | Admitting: Emergency Medicine

## 2015-06-11 DIAGNOSIS — S72142A Displaced intertrochanteric fracture of left femur, initial encounter for closed fracture: Principal | ICD-10-CM | POA: Diagnosis present

## 2015-06-11 DIAGNOSIS — Z79899 Other long term (current) drug therapy: Secondary | ICD-10-CM

## 2015-06-11 DIAGNOSIS — N390 Urinary tract infection, site not specified: Secondary | ICD-10-CM | POA: Diagnosis present

## 2015-06-11 DIAGNOSIS — M549 Dorsalgia, unspecified: Secondary | ICD-10-CM | POA: Diagnosis present

## 2015-06-11 DIAGNOSIS — M25552 Pain in left hip: Secondary | ICD-10-CM

## 2015-06-11 DIAGNOSIS — Z794 Long term (current) use of insulin: Secondary | ICD-10-CM

## 2015-06-11 DIAGNOSIS — R52 Pain, unspecified: Secondary | ICD-10-CM

## 2015-06-11 DIAGNOSIS — M545 Low back pain: Secondary | ICD-10-CM | POA: Diagnosis present

## 2015-06-11 DIAGNOSIS — E162 Hypoglycemia, unspecified: Secondary | ICD-10-CM | POA: Diagnosis present

## 2015-06-11 DIAGNOSIS — W19XXXA Unspecified fall, initial encounter: Secondary | ICD-10-CM | POA: Diagnosis present

## 2015-06-11 DIAGNOSIS — T402X5A Adverse effect of other opioids, initial encounter: Secondary | ICD-10-CM | POA: Diagnosis not present

## 2015-06-11 DIAGNOSIS — S7222XA Displaced subtrochanteric fracture of left femur, initial encounter for closed fracture: Secondary | ICD-10-CM | POA: Diagnosis present

## 2015-06-11 DIAGNOSIS — E871 Hypo-osmolality and hyponatremia: Secondary | ICD-10-CM | POA: Diagnosis not present

## 2015-06-11 DIAGNOSIS — D62 Acute posthemorrhagic anemia: Secondary | ICD-10-CM | POA: Diagnosis not present

## 2015-06-11 DIAGNOSIS — D72829 Elevated white blood cell count, unspecified: Secondary | ICD-10-CM | POA: Diagnosis not present

## 2015-06-11 DIAGNOSIS — Z89412 Acquired absence of left great toe: Secondary | ICD-10-CM

## 2015-06-11 DIAGNOSIS — E119 Type 2 diabetes mellitus without complications: Secondary | ICD-10-CM

## 2015-06-11 DIAGNOSIS — R339 Retention of urine, unspecified: Secondary | ICD-10-CM | POA: Diagnosis not present

## 2015-06-11 DIAGNOSIS — Z419 Encounter for procedure for purposes other than remedying health state, unspecified: Secondary | ICD-10-CM

## 2015-06-11 DIAGNOSIS — Z981 Arthrodesis status: Secondary | ICD-10-CM

## 2015-06-11 DIAGNOSIS — G8929 Other chronic pain: Secondary | ICD-10-CM | POA: Diagnosis present

## 2015-06-11 DIAGNOSIS — S72092D Other fracture of head and neck of left femur, subsequent encounter for closed fracture with routine healing: Secondary | ICD-10-CM

## 2015-06-11 DIAGNOSIS — M4640 Discitis, unspecified, site unspecified: Secondary | ICD-10-CM

## 2015-06-11 DIAGNOSIS — E11649 Type 2 diabetes mellitus with hypoglycemia without coma: Secondary | ICD-10-CM | POA: Diagnosis not present

## 2015-06-11 DIAGNOSIS — M4646 Discitis, unspecified, lumbar region: Secondary | ICD-10-CM | POA: Insufficient documentation

## 2015-06-11 DIAGNOSIS — S7292XA Unspecified fracture of left femur, initial encounter for closed fracture: Secondary | ICD-10-CM | POA: Diagnosis present

## 2015-06-11 DIAGNOSIS — K5903 Drug induced constipation: Secondary | ICD-10-CM | POA: Diagnosis not present

## 2015-06-11 DIAGNOSIS — R000 Tachycardia, unspecified: Secondary | ICD-10-CM | POA: Diagnosis not present

## 2015-06-11 HISTORY — DX: Type 2 diabetes mellitus with other specified complication: E11.69

## 2015-06-11 HISTORY — DX: Non-pressure chronic ulcer of other part of unspecified foot with unspecified severity: L97.509

## 2015-06-11 HISTORY — DX: Type 2 diabetes mellitus with foot ulcer: E11.621

## 2015-06-11 HISTORY — DX: Osteomyelitis, unspecified: M86.9

## 2015-06-11 LAB — COMPREHENSIVE METABOLIC PANEL
ALK PHOS: 173 U/L — AB (ref 38–126)
ALT: 16 U/L — AB (ref 17–63)
AST: 29 U/L (ref 15–41)
Albumin: 3.7 g/dL (ref 3.5–5.0)
Anion gap: 11 (ref 5–15)
BILIRUBIN TOTAL: 1.6 mg/dL — AB (ref 0.3–1.2)
BUN: 11 mg/dL (ref 6–20)
CALCIUM: 9.7 mg/dL (ref 8.9–10.3)
CO2: 27 mmol/L (ref 22–32)
CREATININE: 0.87 mg/dL (ref 0.61–1.24)
Chloride: 98 mmol/L — ABNORMAL LOW (ref 101–111)
GFR calc non Af Amer: >60 mL/min (ref >60)
GLUCOSE: 152 mg/dL — AB (ref 65–99)
Potassium: 4.4 mmol/L (ref 3.5–5.1)
SODIUM: 136 mmol/L (ref 135–145)
Total Protein: 7.7 g/dL (ref 6.5–8.1)

## 2015-06-11 LAB — CBC WITH DIFFERENTIAL/PLATELET
Basophils Absolute: 0.1 10*3/uL (ref 0.0–0.1)
Basophils Relative: 1 %
EOS ABS: 0.1 10*3/uL (ref 0.0–0.7)
EOS PCT: 1 %
HCT: 37.3 % — ABNORMAL LOW (ref 39.0–52.0)
Hemoglobin: 12.9 g/dL — ABNORMAL LOW (ref 13.0–17.0)
LYMPHS ABS: 1.2 10*3/uL (ref 0.7–4.0)
LYMPHS PCT: 16 %
MCH: 30.9 pg (ref 26.0–34.0)
MCHC: 34.6 g/dL (ref 30.0–36.0)
MCV: 89.2 fL (ref 78.0–100.0)
MONO ABS: 1.1 10*3/uL — AB (ref 0.1–1.0)
Monocytes Relative: 15 %
NEUTROS ABS: 5.1 10*3/uL (ref 1.7–7.7)
Neutrophils Relative %: 67 %
PLATELETS: 301 10*3/uL (ref 150–400)
RBC: 4.18 MIL/uL — AB (ref 4.22–5.81)
RDW: 12.3 % (ref 11.5–15.5)
WBC: 7.4 10*3/uL (ref 4.0–10.5)

## 2015-06-11 LAB — URINALYSIS, ROUTINE W REFLEX MICROSCOPIC
Bilirubin Urine: NEGATIVE
GLUCOSE, UA: NEGATIVE mg/dL
Hgb urine dipstick: NEGATIVE
Ketones, ur: NEGATIVE mg/dL
Nitrite: POSITIVE — AB
PH: 6 (ref 5.0–8.0)
Protein, ur: NEGATIVE mg/dL
Specific Gravity, Urine: 1.016 (ref 1.005–1.030)

## 2015-06-11 LAB — URINE MICROSCOPIC-ADD ON

## 2015-06-11 LAB — BRAIN NATRIURETIC PEPTIDE: B Natriuretic Peptide: 27.7 pg/mL (ref 0.0–100.0)

## 2015-06-11 LAB — I-STAT CG4 LACTIC ACID, ED: LACTIC ACID, VENOUS: 2.06 mmol/L — AB (ref 0.5–2.0)

## 2015-06-11 LAB — GLUCOSE, CAPILLARY: Glucose-Capillary: 101 mg/dL — ABNORMAL HIGH (ref 65–99)

## 2015-06-11 MED ORDER — ACETAMINOPHEN 650 MG RE SUPP: 650.0000 mg | Freq: Four times a day (QID) | RECTAL | Status: DC | PRN

## 2015-06-11 MED ORDER — HYDROMORPHONE HCL 1 MG/ML IJ SOLN
1.0000 mg | Freq: Once | INTRAMUSCULAR | Status: AC
  Administered 2015-06-11: 1 mg via INTRAVENOUS

## 2015-06-11 MED ORDER — OXYCODONE HCL 5 MG PO TABS
5.0000 mg | ORAL_TABLET | ORAL | Status: DC | PRN
Start: 2015-06-11 — End: 2015-06-16
  Administered 2015-06-14 – 2015-06-16 (×9): 5 mg via ORAL
  Filled 2015-06-11 (×9): qty 1

## 2015-06-11 MED ORDER — HEPARIN SODIUM (PORCINE) 5000 UNIT/ML IJ SOLN
5000.0000 [IU] | Freq: Three times a day (TID) | INTRAMUSCULAR | Status: DC
  Administered 2015-06-11 – 2015-06-16 (×13): 5000 [IU] via SUBCUTANEOUS
  Filled 2015-06-11 (×17): qty 1

## 2015-06-11 MED ORDER — MELOXICAM 7.5 MG PO TABS
7.5000 mg | ORAL_TABLET | Freq: Every day | ORAL | Status: DC
  Administered 2015-06-11 – 2015-06-23 (×12): 7.5 mg via ORAL
  Filled 2015-06-11 (×12): qty 1

## 2015-06-11 MED ORDER — ONDANSETRON HCL 4 MG PO TABS
4.0000 mg | ORAL_TABLET | Freq: Four times a day (QID) | ORAL | Status: DC | PRN
Start: 2015-06-11 — End: 2015-06-18

## 2015-06-11 MED ORDER — INSULIN ASPART 100 UNIT/ML ~~LOC~~ SOLN
0.0000 [IU] | Freq: Three times a day (TID) | SUBCUTANEOUS | Status: DC
  Administered 2015-06-12: 2 [IU] via SUBCUTANEOUS
  Administered 2015-06-13: 3 [IU] via SUBCUTANEOUS
  Administered 2015-06-13 – 2015-06-15 (×3): 2 [IU] via SUBCUTANEOUS

## 2015-06-11 MED ORDER — ONDANSETRON HCL 4 MG/2ML IJ SOLN: 4.0000 mg | Freq: Four times a day (QID) | INTRAMUSCULAR | Status: DC | PRN

## 2015-06-11 MED ORDER — SODIUM CHLORIDE 0.9 % IV SOLN: 250.0000 mL | INTRAVENOUS | Status: DC | PRN

## 2015-06-11 MED ORDER — HYDROMORPHONE HCL 1 MG/ML IJ SOLN
1.0000 mg | Freq: Once | INTRAMUSCULAR | Status: AC
  Administered 2015-06-11: 1 mg via INTRAVENOUS
  Filled 2015-06-11: qty 1

## 2015-06-11 MED ORDER — LORAZEPAM 2 MG/ML IJ SOLN
1.0000 mg | INTRAMUSCULAR | Status: DC | PRN
Start: 2015-06-11 — End: 2015-06-11
  Administered 2015-06-11: 1 mg via INTRAVENOUS
  Filled 2015-06-11: qty 1

## 2015-06-11 MED ORDER — SODIUM CHLORIDE 0.9 % IV BOLUS (SEPSIS)
1000.0000 mL | Freq: Once | INTRAVENOUS | Status: AC
  Administered 2015-06-11: 1000 mL via INTRAVENOUS

## 2015-06-11 MED ORDER — INSULIN ASPART PROT & ASPART (70-30 MIX) 100 UNIT/ML ~~LOC~~ SUSP
10.0000 [IU] | Freq: Every day | SUBCUTANEOUS | Status: DC
  Administered 2015-06-13 – 2015-06-14 (×2): 10 [IU] via SUBCUTANEOUS
  Filled 2015-06-11: qty 10

## 2015-06-11 MED ORDER — DEXTROSE 5 % IV SOLN
1.0000 g | Freq: Once | INTRAVENOUS | Status: AC
  Administered 2015-06-11: 1 g via INTRAVENOUS
  Filled 2015-06-11: qty 10

## 2015-06-11 MED ORDER — INSULIN ASPART 100 UNIT/ML ~~LOC~~ SOLN: 0.0000 [IU] | Freq: Every day | SUBCUTANEOUS | Status: DC

## 2015-06-11 MED ORDER — SODIUM CHLORIDE 0.9 % IJ SOLN
3.0000 mL | Freq: Two times a day (BID) | INTRAMUSCULAR | Status: DC
  Administered 2015-06-12 – 2015-06-21 (×10): 3 mL via INTRAVENOUS

## 2015-06-11 MED ORDER — CEFTRIAXONE SODIUM 1 G IJ SOLR
1.0000 g | INTRAMUSCULAR | Status: DC
  Administered 2015-06-12 – 2015-06-16 (×5): 1 g via INTRAVENOUS
  Filled 2015-06-11 (×6): qty 10

## 2015-06-11 MED ORDER — ACETAMINOPHEN 325 MG PO TABS
650.0000 mg | ORAL_TABLET | Freq: Four times a day (QID) | ORAL | Status: DC | PRN
  Administered 2015-06-14 – 2015-06-16 (×4): 650 mg via ORAL
  Filled 2015-06-11 (×4): qty 2

## 2015-06-11 MED ORDER — INSULIN ASPART PROT & ASPART (70-30 MIX) 100 UNIT/ML PEN: 10.0000 [IU] | PEN_INJECTOR | Freq: Every day | SUBCUTANEOUS | Status: DC

## 2015-06-11 MED ORDER — HYDROMORPHONE HCL 1 MG/ML IJ SOLN
1.0000 mg | Freq: Once | INTRAMUSCULAR | Status: AC
Start: 2015-06-11 — End: 2015-06-11
  Administered 2015-06-11: 1 mg via INTRAVENOUS
  Filled 2015-06-11: qty 1

## 2015-06-11 MED ORDER — GADOBENATE DIMEGLUMINE 529 MG/ML IV SOLN
20.0000 mL | Freq: Once | INTRAVENOUS | Status: AC | PRN
  Administered 2015-06-11: 17 mL via INTRAVENOUS

## 2015-06-11 MED ORDER — INSULIN ASPART PROT & ASPART (70-30 MIX) 100 UNIT/ML PEN
10.0000 [IU] | PEN_INJECTOR | Freq: Two times a day (BID) | SUBCUTANEOUS | Status: DC
Start: 2015-06-11 — End: 2015-06-11
  Filled 2015-06-11: qty 3

## 2015-06-11 MED ORDER — HYDROMORPHONE HCL 1 MG/ML IJ SOLN
1.0000 mg | Freq: Once | INTRAMUSCULAR | Status: DC
  Filled 2015-06-11: qty 1

## 2015-06-11 MED ORDER — SODIUM CHLORIDE 0.9 % IJ SOLN: 3.0000 mL | INTRAMUSCULAR | Status: DC | PRN

## 2015-06-11 MED ORDER — HYDROMORPHONE HCL 1 MG/ML IJ SOLN
1.0000 mg | INTRAMUSCULAR | Status: DC | PRN
  Administered 2015-06-11 – 2015-06-14 (×15): 1 mg via INTRAVENOUS
  Filled 2015-06-11 (×15): qty 1

## 2015-06-11 MED ORDER — INSULIN ASPART PROT & ASPART (70-30 MIX) 100 UNIT/ML ~~LOC~~ SUSP
22.0000 [IU] | Freq: Every day | SUBCUTANEOUS | Status: DC
  Administered 2015-06-12 – 2015-06-15 (×4): 22 [IU] via SUBCUTANEOUS
  Filled 2015-06-11: qty 10

## 2015-06-11 MED ORDER — GABAPENTIN 300 MG PO CAPS
300.0000 mg | ORAL_CAPSULE | Freq: Four times a day (QID) | ORAL | Status: DC
  Administered 2015-06-11 – 2015-06-23 (×44): 300 mg via ORAL
  Filled 2015-06-11 (×46): qty 1

## 2015-06-11 NOTE — ED Notes (Signed)
Pt transported to MRI 

## 2015-06-11 NOTE — Progress Notes (Signed)
CSW spoke with patient at bedside. Patient stated he was admitted due to his leg not supporting his weight. Patient stated he fell yesterday. Patient stated he fell in [place] last Saturday night and broke his ribs, which punctured his lungs. Patient stated he has fallen around ten times in the past six months. Patient stated he does not know when his leg will give out on him. Patient stated he has not been to sleep for two days. Patient stated he was admitted here five weeks ago. Patient stated he had to have his big toe on his left foot amputated.   Patient stated he lives at home alone. Patient stated his family is in [place]. Patient stated that his youngest son, Eino Farberhillip Gerads does come from [place] twice weekly. Patient stated he is the only person that checks on him.   Patient stated he had no questions at this time.   Eino Farberhillip [place], son, [phone]

## 2015-06-11 NOTE — ED Notes (Signed)
Pt returned from MRI °

## 2015-06-11 NOTE — ED Notes (Signed)
Pt BIB EMS; pt reports he has been falling more often recently, pt states "My legs just give out"; pt has walker but does not always use it; pt had fall last week with several broken ribs that he was evaluated for in [place]; pt c/o left hip pain; pt able to move leg with pain.

## 2015-06-11 NOTE — ED Notes (Signed)
Bed: WA09 Expected date:  Expected time:  Means of arrival:  Comments: EMS- Fall; hip pain

## 2015-06-11 NOTE — Progress Notes (Addendum)
Pt states advanced home care staff came out once (a male) and he "did not see the ned for them"  "told him he did not have to come back" Pt denies need for home health if determined needed on this ED visit. Pt states "I'm okay once I can get up and get moving around" [facility]  Pt states he has walker, canes no w/c Pt denied need for w/c states he prefers to stand, sitting too long causes problems for him.  CM discussed may a light wt w/c could assist to prevent frequent falls but pt reports having a studio apartment that is small and he would not be able to tolerate sitting in it and then getting up Reports "needing to keep active"

## 2015-06-11 NOTE — ED Notes (Signed)
MD at bedside. 

## 2015-06-11 NOTE — Progress Notes (Signed)
BRIEF PHARMACY NOTE -    Pharmacy consulted to dose ceftriaxone for UTI. PLAN:  Ceftriaxone 1gm IV q24h,   need for further dosage adjustment appears unlikely at present.    Will sign off at this time.  Please reconsult if a change in clinical status warrants re-evaluation of dosage.  Arley Phenixllen Rieley Khalsa RPh 06/11/2015, 7:13 PM Pager [phone]

## 2015-06-11 NOTE — ED Notes (Signed)
Repeat CBC drawn per lab request.

## 2015-06-11 NOTE — H&P (Addendum)
[facility] History and Physical  Jeff Ferrell ZOX:[id number] DOB: 01-23-1951 DOA: 06/11/2015  Referring physician: Dr. Jeraldine Loots PCP: [name], MD   Chief Complaint: back pain  HPI: Jeff Ferrell is a 64 y.o. male Patient has a history of extensive back surgery in 2011 with lumbar fusion of L2-S1 PLIF with hardware multilevel laminectomies at the [facility] in [place]. He is presenting with 2 day complaint of back discomfort. He has had back pain for years but states that within last 2 days his pain medication regimen at home has not been helping him. Nothing he is aware of makes it better. Activity makes it worse. He denies any fevers. Given persistence in symptoms patient presented to the hospital for further evaluation recommendations.  In the ED patient was evaluated by ID physician who did not recommend any further workup for patient's back. Given intractable back pain and urinary tract infection we were consulted for further evaluation recommendations.  Review of Systems:  Constitutional:  No weight loss, night sweats, Fevers, chills, fatigue.  HEENT:  No headaches, Difficulty swallowing,Tooth/dental problems,Sore throat,  No sneezing, itching, ear ache, nasal congestion, post nasal drip,  Cardio-vascular:  No chest pain, Orthopnea, PND, swelling in lower extremities, anasarca, dizziness, palpitations  GI:  No heartburn, indigestion, abdominal pain, nausea, vomiting, diarrhea, change in bowel habits, loss of appetite  Resp:  No shortness of breath with exertion or at rest. No excess mucus, no productive cough, No non-productive cough, No coughing up of blood.No change in color of mucus.No wheezing.No chest wall deformity  Skin:  no rash or lesions.  GU:  no dysuria, change in color of urine, no urgency or frequency. No flank pain.  Musculoskeletal:  + joint pain or swelling. No decreased range of motion. + back pain.  Psych:  No change in mood or affect. No depression  or anxiety. No memory loss.   Past Medical History  Diagnosis Date  . Diabetes mellitus     regulated by diet  . Chronic back pain    Past Surgical History  Procedure Laterality Date  . Back surgery      screws & hardware to lower back  . Amputation toe Left 03/30/2015    Procedure: AMPUTATION LEFT GREAT TOE;  Surgeon: Marcene Corning, MD;  Location: [facility];  Service: Orthopedics;  Laterality: Left;   Social History:  reports that he has never smoked. He does not have any smokeless tobacco history on file. He reports that he drinks about 3.6 oz of alcohol per week. He reports that he does not use illicit drugs.  Allergies  Allergen Reactions  . Penicillins Hives and Rash    Has patient had a PCN reaction causing immediate rash, facial/tongue/throat swelling, SOB or lightheadedness with hypotension: Yes Has patient had a PCN reaction causing severe rash involving mucus membranes or skin necrosis: Yes   Has patient had a PCN reaction that required hospitalization No Has patient had a PCN reaction occurring within the last 10 years: No If all of the above answers are "NO", then may proceed with Cephalosporin use.     History reviewed.  Family history - back pain  Prior to Admission medications   Medication Sig Start Date End Date Taking? Authorizing Provider  gabapentin (NEURONTIN) 300 MG capsule Take 300 mg by mouth 4 (four) times daily.   Yes Historical Provider, MD  glipiZIDE (GLUCOTROL) 10 MG tablet Take 10 mg by mouth daily.   Yes Historical Provider, MD  insulin aspart protamine - aspart (NOVOLOG  70/30 MIX) (70-30) 100 UNIT/ML FlexPen Inject 0.22 mLs (22 Units total) into the skin daily with supper. Patient taking differently: Inject 10-22 Units into the skin 2 (two) times daily. 22 units in the morning and 10 units with supper 04/01/15  Yes Marinda Elk, MD  ciprofloxacin (CIPRO) 250 MG tablet Take 1 tablet (250 mg total) by mouth 2 (two) times daily. Patient not  taking: Reported on 06/11/2015 04/01/15   Marinda Elk, MD  doxycycline (VIBRA-TABS) 100 MG tablet Take 1 tablet (100 mg total) by mouth 2 (two) times daily. Patient not taking: Reported on 06/11/2015 04/01/15   Marinda Elk, MD  insulin aspart protamine- aspart (NOVOLOG MIX 70/30) (70-30) 100 UNIT/ML injection Inject 0.4 mLs (40 Units total) into the skin daily with breakfast. Takes 40 units with breakfast Patient not taking: Reported on 06/11/2015 04/01/15   Marinda Elk, MD   Physical Exam: Filed Vitals:   06/11/15 1500 06/11/15 1528 06/11/15 1556 06/11/15 1638  BP: 112/76 113/90 111/77 114/77  Pulse:  106 99 102  Temp:  97.8 F (36.6 C)    TempSrc:  Oral    Resp:  Height:      Weight:      SpO2:  95% 98% 97%    Wt Readings from Last 3 Encounters:  06/11/15 83.462 kg (184 lb)  03/29/15 83.9 kg (184 lb 15.5 oz)  05/01/11 81.647 kg (180 lb)    General:  Appears calm and uncomfortable Eyes: PERRL, normal lids, irises & conjunctiva ENT: grossly normal hearing, lips & tongue Neck: no LAD, masses or thyromegaly Cardiovascular: RRR, no m/r/g. No LE edema. Respiratory: CTA bilaterally, no w/r/r. Normal respiratory effort. Abdomen: soft, nt, nd Skin: no rash or induration seen on limited exam Musculoskeletal: Patient's exam is limited as he is complaining of low back pain and limits his positioning to supine examination.  Psychiatric: grossly normal mood and affect, speech fluent and appropriate Neurologic: Patient is able to move feet bl and sensation to light touch is intact.           Labs on Admission:  Basic Metabolic Panel:  Recent Labs Lab 06/11/15 1018  NA 136  K 4.4  CL 98*  CO2 27  GLUCOSE 152*  BUN 11  CREATININE 0.87  CALCIUM 9.7   Liver Function Tests:  Recent Labs Lab 06/11/15 1018  AST 29  ALT 16*  ALKPHOS 173*  BILITOT 1.6*  PROT 7.7  ALBUMIN 3.7   No results for input(s): LIPASE, AMYLASE in the last 168  hours. No results for input(s): AMMONIA in the last 168 hours. CBC:  Recent Labs Lab 06/11/15 1106  WBC 7.4  NEUTROABS 5.1  HGB 12.9*  HCT 37.3*  MCV 89.2  PLT 301   Cardiac Enzymes: No results for input(s): CKTOTAL, CKMB, CKMBINDEX, TROPONINI in the last 168 hours.  BNP (last 3 results)  Recent Labs  06/11/15 1106  BNP 27.7    ProBNP (last 3 results) No results for input(s): PROBNP in the last 8760 hours.  CBG: No results for input(s): GLUCAP in the last 168 hours.  Radiological Exams on Admission: Mr Lumbar Spine W Wo Contrast  06/11/2015  CLINICAL DATA:  Severe low back pain radiating to the left leg EXAM: MRI LUMBAR SPINE WITHOUT AND WITH CONTRAST TECHNIQUE: Multiplanar and multiecho pulse sequences of the lumbar spine were obtained without and with intravenous contrast. CONTRAST:  17mL MULTIHANCE GADOBENATE DIMEGLUMINE 529 MG/ML IV SOLN COMPARISON:  None. FINDINGS: Severe L5 vertebral body height loss. The vertebral bodies of the lumbar spine are normal in alignment. There is normal bone marrow signal demonstrated throughout the vertebra. Posterior lumbar fusion from L2 through S1. Severe L4-5 disc space narrowing. There is increased T2 signal within the L5-S1 disc space with mild disc enhancement. There is no paravertebral fluid collection or edema. The spinal cord is normal in signal and contour. The cord terminates normally at T12 . The nerve roots of the cauda equina and the filum terminale are normal. The visualized portions of the SI joints are unremarkable. The imaged intra-abdominal contents are unremarkable. T12-L1: No significant disc bulge. No evidence of neural foraminal stenosis. No central canal stenosis. L1-L2: Mild broad-based disc bulge. No evidence of neural foraminal stenosis. Mild bilateral facet arthropathy. Mild spinal stenosis. L2-L3: No significant disc bulge. No evidence of neural foraminal stenosis. No central canal stenosis. L3-L4: Limited evaluation  secondary to susceptibility artifact. No significant disc bulge. No evidence of neural foraminal stenosis. No central canal stenosis. L4-L5: Limited evaluation secondary to susceptibility artifact. Severe disc height loss. No evidence of neural foraminal stenosis. No central canal stenosis. L5-S1: Increased T2 signal within the L5-S1 disc space with mild disc enhancement. Mild bilateral foraminal narrowing. No central canal stenosis. IMPRESSION: 1. Increased T2 signal within the L5-S1 disc space with mild disc enhancement. There is no adjacent marrow signal abnormality. There is no paravertebral soft tissue abnormality. This may reflect early discitis versus postsurgical changes. 2. Posterior lumbar fusion from L2 through S1. 3. Severe disc height loss at L4-5 and L5 vertebral body height loss which may be secondary to prior discitis or prior trauma. Electronically Signed   By: Elige KoHetal  Patel   On: 06/11/2015 12:34   Dg Toe 2nd Left  06/11/2015  CLINICAL DATA:  Fall today with second toe pain, initial encounter EXAM: LEFT SECOND TOE COMPARISON:  03/29/2015 FINDINGS: There is been interval amputation of the distal first toe with only a portion of the proximal phalanx are identified. Irregularity and increased sclerosis is noted just below the head of the second metatarsal consistent with subacute injury. No fracture is identified. No bony erosion to suggest osteomyelitis is seen. IMPRESSION: Postoperative change. Changes consistent with a subacute fracture in the distal second metatarsal. No other focal abnormality is seen. Electronically Signed   By: Alcide CleverMark  Lukens M.D.   On: 06/11/2015 10:05   Dg Hip Unilat With Pelvis 2-3 Views Left  06/11/2015  CLINICAL DATA:  Pain following fall EXAM: DG HIP (WITH OR WITHOUT PELVIS) 2-3V LEFT COMPARISON:  November 30, 2006 FINDINGS: Frontal pelvis as well as frontal and lateral left hip images were obtained. There is slight symmetric narrowing of both hip joints. No fracture or  dislocation. No erosive change. There is extensive postoperative change in the lower lumbar spine and upper sacral regions. IMPRESSION: No acute fracture or dislocation. Mild symmetric narrowing both hip joints. Extensive postoperative change in the visualized lower lumbar spine and upper sacral regions. Electronically Signed   By: Bretta BangWilliam  Woodruff III M.D.   On: 06/11/2015 09:45    Assessment/Plan Principal Problem:   UTI (lower urinary tract infection) - Rocephin - f/u with urine culture  Active Problems:   Back pain - acetaminophen prn, oxycodone prn, dilaudid prn severe pain,  - meloxicam - Per ID no further back studies recommended. As such we'll not initiate antibiotic therapy is currently back discomfort not felt to be infectious in etiology.  Addendum: Diabetes -Continue home insulin  regimen -Hold glipizide and place on sliding scale insulin -diabetic diet  Code Status: full DVT Prophylaxis: heparin Family Communication: none at bedside Disposition Plan: Med surg bed  Time spent: > 45 minutes  Penny Pia [facility] Pager [phone]

## 2015-06-11 NOTE — Consult Note (Signed)
[facility]       Reason for Consult: ? discitis    Referring Physician: Dr. Cena BentonVega     Recommendations: No further evaluation of back  Routine screening for HIV and hepatitis C Orthopedic follow up as outpatient  Assessment: He has an extensive history of back surgery in 2011 with lumbar fusion of L2-S1 PLIF with hardware and multilevel l[facility]ctomies at [facility] in [place] and subsequent infection in the area after that with persistent pain since.  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  His MRI now shows some mild disc enhancement without definitive diagnosis. It would be very difficult to tell in the person if a true discitis was shown and no abscess or fluid collection.  Normal WBC, progressive symptoms mainly of left hip, weakness.  Though certainly dicitis can radiate to hip, it typically would cause increased back pain so I think his current issue is related to the hip.  Therefore I do not find clinical evidence for discitis and no evaluation for discitis indicated.    Also noted fracture in his toe  Antibiotics: none  HPI: Jeff Ferrell is a 64 y.o. male with diabetes, recent toe amputation, chronic pain with history of back surgery and infection as above who came in to ED because he could no longer walk at home.  He had recently fallen and broke some ribs, by his account.  No fever or chills at home.  He has had significant back and hip spasms since his surgery in 2011.   MRI independently reviewed and noted changes.    Review of Systems:  Constitutional: negative for anorexia Musculoskeletal: positive for weakness All other systems reviewed and are negative   Past Medical History  Diagnosis Date  . Diabetes mellitus     regulated by diet  . Chronic back pain     Social History  Substance Use Topics  . Smoking status: Never Smoker   . Smokeless tobacco: None  . Alcohol Use: 3.6 oz/week    6 Cans of beer per week     Comment: daily    FMHx;  no rheumatoid diseases known  Allergies  Allergen Reactions  . Penicillins Hives and Rash    Has patient had a PCN reaction causing immediate rash, facial/tongue/throat swelling, SOB or lightheadedness with hypotension: Yes Has patient had a PCN reaction causing severe rash involving mucus membranes or skin necrosis: Yes   Has patient had a PCN reaction that required hospitalization No Has patient had a PCN reaction occurring within the last 10 years: No If all of the above answers are "NO", then may proceed with Cephalosporin use.     Physical Exam: Constitutional: afebrile , appears in moderate distress with pain  Filed Vitals:   06/11/15 1445 06/11/15 1500  BP:  112/76  Pulse: 105   Temp:    Resp:     EYES: anicteric ENMT: no thrush Cardiovascular: Cor RRR and No murmurs Respiratory: CTA B; normal respiratory effort GI: Bowel sounds are normal, liver is not enlarged, spleen is not enlarged Musculoskeletal: not examined Skin: negatives: no rash, areas of healing scabs on legs Hematologic: no cervical lad  Lab Results  Component Value Date   WBC 7.4 06/11/2015   HGB 12.9* 06/11/2015   HCT 37.3* 06/11/2015   MCV 89.2 06/11/2015   PLT 301 06/11/2015    Lab Results  Component Value Date   CREATININE 0.87 06/11/2015   BUN 11 06/11/2015   NA 136 06/11/2015  K 4.4 06/11/2015   CL 98* 06/11/2015   CO2 27 06/11/2015    Lab Results  Component Value Date   ALT 16* 06/11/2015   AST 29 06/11/2015   ALKPHOS 173* 06/11/2015     Microbiology: No results found for this or any previous visit (from the past 240 hour(s)).  Staci Righter, MD [facility] [email or web address] [phone] pager  [phone] cell 06/11/2015, 3:15 PM

## 2015-06-11 NOTE — ED Notes (Signed)
Patient aware urine sample is needed. Urinal is at the bedside.  

## 2015-06-11 NOTE — ED Notes (Signed)
Went to draw labs pt back to xray

## 2015-06-11 NOTE — ED Provider Notes (Addendum)
CSN: [id number]     Arrival date & time 06/11/15  [id number] History   First MD Initiated Contact with Patient 06/11/15 [phone]     Chief Complaint  Patient presents with  . Hip Pain  . Fall     (Consider location/radiation/quality/duration/timing/severity/associated sxs/prior Treatment) HPI Patient presents with concern of generalized weakness, left hip pain. Patient has multiple medical issues, including chronic back pain, vasculopathy. One week ago, the patient had a fall. Patient was seen in [place]. He was admitted, was there for several days, he had multiple rib fractures. He denies interval was, but states that since going home he has had persistent generalized weakness, anorexia, and has been unable to stand secondary to pain in the left hip, and the weakness. Patient's history is also notable for multiple back surgery, as well as recent application of the left great toe Today, the patient was scheduled to see orthopedics in the office for evaluation of his second left toe, due to cutaneous changes. No new medication, diet.    Past Medical History  Diagnosis Date  . Diabetes mellitus     regulated by diet  . Chronic back pain    Past Surgical History  Procedure Laterality Date  . Back surgery      screws & hardware to lower back  . 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 No pertinent family history. Social History  Substance Use Topics  . Smoking status: Never Smoker   . Smokeless tobacco: None  . Alcohol Use: 3.6 oz/week    6 Cans of beer per week     Comment: daily    Review of Systems  Constitutional:       Per HPI, otherwise negative  HENT:       Per HPI, otherwise negative  Respiratory:       Per HPI, otherwise negative  Cardiovascular:       Per HPI, otherwise negative  Gastrointestinal: Negative for vomiting.    Endocrine:       Negative aside from HPI  Genitourinary:       Neg aside from HPI   Musculoskeletal:       Per HPI, otherwise negative  Skin: Positive for color change and wound.  Neurological: Positive for weakness. Negative for syncope and numbness.      Allergies  Penicillins  Home Medications   Prior to Admission medications   Medication Sig Start Date End Date Taking? Authorizing Provider  gabapentin (NEURONTIN) 300 MG capsule Take 300 mg by mouth 4 (four) times daily.   Yes Historical Provider, MD  glipiZIDE (GLUCOTROL) 10 MG tablet Take 10 mg by mouth daily.   Yes Historical Provider, MD  insulin aspart protamine - aspart (NOVOLOG 70/30 MIX) (70-30) 100 UNIT/ML FlexPen Inject 0.22 mLs (22 Units total) into the skin daily with supper. Patient taking differently: Inject 10-22 Units into the skin 2 (two) times daily. 22 units in the morning and 10 units with supper 04/01/15  Yes Marinda ElkAbraham Feliz Ortiz, MD  ciprofloxacin (CIPRO) 250 MG tablet Take 1 tablet (250 mg total) by mouth 2 (two) times daily. Patient not taking: Reported on 06/11/2015 04/01/15   Marinda ElkAbraham Feliz Ortiz, MD  doxycycline (VIBRA-TABS) 100 MG tablet Take 1 tablet (100 mg total) by mouth 2 (two) times daily. Patient not taking: Reported on 06/11/2015 04/01/15   Marinda ElkAbraham Feliz Ortiz, MD  insulin aspart protamine- aspart (NOVOLOG MIX 70/30) (70-30) 100 UNIT/ML injection Inject 0.4 mLs (40 Units total) into the skin daily with breakfast. Takes 40 units with breakfast Patient not taking: Reported on 06/11/2015 04/01/15   Marinda Elk, MD   BP 112/76 mmHg  Pulse 105  Temp(Src) 97.4 F (36.3 C) (Oral)  Resp 18  Ht  (1.753 m)  Wt 184 lb (83.462 kg)  BMI 27.16 kg/m2  SpO2 95% Physical Exam  Constitutional: He is oriented to person, place, and time. He has a sickly appearance.  HENT:  Head: Normocephalic and atraumatic.  Eyes: Conjunctivae and EOM are normal.  Cardiovascular: Regular rhythm.   Tachycardia present.   Pulmonary/Chest: Effort normal. No stridor. No respiratory distress.  Abdominal: He exhibits no distension.  Musculoskeletal: He exhibits no edema.       Arms:      Legs: Neurological: He is alert and oriented to person, place, and time. He displays atrophy. He displays no tremor. He displays no seizure activity. Coordination normal.  Skin: Skin is warm and dry.     Psychiatric: He has a normal mood and affect.  Nursing note and vitals reviewed.   ED Course  Procedures (including critical care time) Labs Review Labs Reviewed  COMPREHENSIVE METABOLIC PANEL - Abnormal; Notable for the following:    Chloride 98 (*)    Glucose, Bld 152 (*)    ALT 16 (*)    Alkaline Phosphatase 173 (*)    Total Bilirubin 1.6 (*)    All other components within normal limits  CBC WITH DIFFERENTIAL/PLATELET - Abnormal; Notable for the following:    RBC 4.18 (*)    Hemoglobin 12.9 (*)    HCT 37.3 (*)    Monocytes Absolute 1.1 (*)    All other components within normal limits  I-STAT CG4 LACTIC ACID, ED - Abnormal; Notable for the following:    Lactic Acid, Venous 2.06 (*)    All other components within normal limits  BRAIN NATRIURETIC PEPTIDE  CBC WITH DIFFERENTIAL/PLATELET  URINALYSIS, ROUTINE W REFLEX MICROSCOPIC (NOT AT [facility])    Imaging Review Mr Lumbar Spine W Wo Contrast  06/11/2015  CLINICAL DATA:  Severe low back pain radiating to the left leg EXAM: MRI LUMBAR SPINE WITHOUT AND WITH CONTRAST TECHNIQUE: Multiplanar and multiecho pulse sequences of the lumbar spine were obtained without and with intravenous contrast. CONTRAST:  17mL MULTIHANCE GADOBENATE DIMEGLUMINE 529 MG/ML IV SOLN COMPARISON:  None. FINDINGS: Severe L5 vertebral body height loss. The vertebral bodies of the lumbar spine are normal in alignment. There is normal bone marrow signal demonstrated throughout the vertebra. Posterior lumbar fusion from L2 through S1. Severe L4-5 disc space narrowing. There is  increased T2 signal within the L5-S1 disc space with mild disc enhancement. There is no paravertebral fluid collection or edema. The spinal cord is normal in signal and contour. The cord terminates normally at T12 . The nerve roots of the cauda equina and the filum terminale are normal. The visualized portions of the SI joints are unremarkable. The imaged intra-abdominal contents are unremarkable. T12-L1: No significant disc bulge. No evidence of neural foraminal stenosis. No central canal stenosis. L1-L2: Mild broad-based disc bulge. No evidence of neural foraminal stenosis. Mild bilateral facet arthropathy. Mild spinal stenosis. L2-L3: No significant disc bulge. No evidence of neural foraminal stenosis. No central canal stenosis. L3-L4: Limited evaluation secondary to susceptibility artifact. No significant disc bulge. No evidence of neural foraminal stenosis. No central canal stenosis. L4-L5: Limited  evaluation secondary to susceptibility artifact. Severe disc height loss. No evidence of neural foraminal stenosis. No central canal stenosis. L5-S1: Increased T2 signal within the L5-S1 disc space with mild disc enhancement. Mild bilateral foraminal narrowing. No central canal stenosis. IMPRESSION: 1. Increased T2 signal within the L5-S1 disc space with mild disc enhancement. There is no adjacent marrow signal abnormality. There is no paravertebral soft tissue abnormality. This may reflect early discitis versus postsurgical changes. 2. Posterior lumbar fusion from L2 through S1. 3. Severe disc height loss at L4-5 and L5 vertebral body height loss which may be secondary to prior discitis or prior trauma. Electronically Signed   By: Elige Ko   On: 06/11/2015 12:34   Dg Toe 2nd Left  06/11/2015  CLINICAL DATA:  Fall today with second toe pain, initial encounter EXAM: LEFT SECOND TOE COMPARISON:  03/29/2015 FINDINGS: There is been interval amputation of the distal first toe with only a portion of the proximal  phalanx are identified. Irregularity and increased sclerosis is noted just below the head of the second metatarsal consistent with subacute injury. No fracture is identified. No bony erosion to suggest osteomyelitis is seen. IMPRESSION: Postoperative change. Changes consistent with a subacute fracture in the distal second metatarsal. No other focal abnormality is seen. Electronically Signed   By: Alcide Clever M.D.   On: 06/11/2015 10:05   Dg Hip Unilat With Pelvis 2-3 Views Left  06/11/2015  CLINICAL DATA:  Pain following fall EXAM: DG HIP (WITH OR WITHOUT PELVIS) 2-3V LEFT COMPARISON:  November 30, 2006 FINDINGS: Frontal pelvis as well as frontal and lateral left hip images were obtained. There is slight symmetric narrowing of both hip joints. No fracture or dislocation. No erosive change. There is extensive postoperative change in the lower lumbar spine and upper sacral regions. IMPRESSION: No acute fracture or dislocation. Mild symmetric narrowing both hip joints. Extensive postoperative change in the visualized lower lumbar spine and upper sacral regions. Electronically Signed   By: Bretta Bang III M.D.   On: 06/11/2015 09:45   I have personally reviewed and evaluated these images and lab results as part of my medical decision-making.  On repeat exam patient remains tachycardic, no new complaints, continues to have pain.  Patient's tachycardia has reduced, but is still present in spite of fluid resuscitation.   Update: Patient aware of all results. With concern for discitis, he will be admitted.   3:08 PM   I discussed the patient's case with our IR colleague.  The patient is scheduled for US guided biopsy.  4:05 PM ID has evaluated the patient -low suspicion for infectious discitis.  No indication for biopsy.  On re-check the patient remains in similar condition.  LE pain, tachycardia is improving.  MDM   Final diagnoses:  Pain  Discitis of lumbar region  Discitis   UTI   Patient with multiple medical issues, including prior back intervention now presents with increased lower extremity discomfort, weakness. Patient has had frequent falls, and today has pain in her lower back, left. No x-ray evidence for fracture. Patient does have persistent tachycardia, mild lactic acidosis, and with MRI concerning for possible discitis, and e/o UTI the patient was admitted for further evaluation and management.   Gerhard Munch, MD 06/11/15 1510  Gerhard Munch, MD 06/11/15 [phone]

## 2015-06-11 NOTE — ED Notes (Signed)
Pt encouraged to void; pt verbalizes he will attempt to void.

## 2015-06-11 NOTE — ED Notes (Signed)
Delay in blood draw due to pt not being in room

## 2015-06-11 NOTE — ED Notes (Signed)
Patient in MRI will obtain vitals and inquire about urine sample when patient returns

## 2015-06-11 NOTE — Progress Notes (Signed)
CSW staffed with nurse before speaking with patient. Pharmacy was at beside.   Elenore PaddyLaVonia Tiffiny Worthy, LCSWA [phone] ED CSW 06/11/2015 9:04 AM

## 2015-06-12 DIAGNOSIS — N390 Urinary tract infection, site not specified: Secondary | ICD-10-CM | POA: Diagnosis not present

## 2015-06-12 LAB — CBC
HEMATOCRIT: 41.5 % (ref 39.0–52.0)
HEMOGLOBIN: 14 g/dL (ref 13.0–17.0)
MCH: 30.8 pg (ref 26.0–34.0)
MCHC: 33.7 g/dL (ref 30.0–36.0)
MCV: 91.4 fL (ref 78.0–100.0)
Platelets: 271 10*3/uL (ref 150–400)
RBC: 4.54 MIL/uL (ref 4.22–5.81)
RDW: 12.3 % (ref 11.5–15.5)
WBC: 7.6 10*3/uL (ref 4.0–10.5)

## 2015-06-12 LAB — BASIC METABOLIC PANEL
ANION GAP: 12 (ref 5–15)
BUN: 13 mg/dL (ref 6–20)
CHLORIDE: 97 mmol/L — AB (ref 101–111)
CO2: 25 mmol/L (ref 22–32)
Calcium: 9.2 mg/dL (ref 8.9–10.3)
Creatinine, Ser: 0.72 mg/dL (ref 0.61–1.24)
GFR calc non Af Amer: >60 mL/min (ref >60)
Glucose, Bld: 125 mg/dL — ABNORMAL HIGH (ref 65–99)
POTASSIUM: 4.4 mmol/L (ref 3.5–5.1)
Sodium: 134 mmol/L — ABNORMAL LOW (ref 135–145)

## 2015-06-12 LAB — GLUCOSE, CAPILLARY
GLUCOSE-CAPILLARY: 114 mg/dL — AB (ref 65–99)
GLUCOSE-CAPILLARY: 121 mg/dL — AB (ref 65–99)
GLUCOSE-CAPILLARY: 58 mg/dL — AB (ref 65–99)
GLUCOSE-CAPILLARY: 70 mg/dL (ref 65–99)
Glucose-Capillary: 97 mg/dL (ref 65–99)

## 2015-06-12 LAB — HIV ANTIBODY (ROUTINE TESTING W REFLEX): HIV Screen 4th Generation wRfx: NONREACTIVE

## 2015-06-12 LAB — HEPATITIS C ANTIBODY: HCV Ab: 0.1 s/co ratio (ref 0.0–0.9)

## 2015-06-12 NOTE — Progress Notes (Signed)
[facility] PROGRESS NOTE  Guinevere FerrariHarold Scheibe ZOX:[id number] DOB: 02/17/1951 DOA: 06/11/2015 PCP: Daisy FloroWYMER,ANTOINETTE, MD  Assessment/Plan: Principal Problem:   UTI (lower urinary tract infection) - Will continue Rocephin  Active Problems:   Back pain - continue supportive therapy - no further imaging test recommended for evaluation of back discomfort  Code Status: full Family Communication: none at bedside Disposition Plan: pending evaluation by PT   Consultants:  Infectious disease  Procedures:  None  Antibiotics:  Rocephin  HPI/Subjective: Pt has no new complaints. His concerns is whether he will be able to ambulate at home.  Objective: Filed Vitals:   06/12/15 0520 06/12/15 1310  BP: 113/76 109/83  Pulse: 112 112  Temp: 97.3 F (36.3 C) 97.8 F (36.6 C)  Resp: 18 20    Intake/Output Summary (Last 24 hours) at 06/12/15 1429 Last data filed at 06/12/15 1304  Gross per 24 hour  Intake    600 ml  Output    375 ml  Net    225 ml   Filed Weights   06/11/15 0838  Weight: 83.462 kg (184 lb)    Exam:   General:  Pt in nad, alert and awake  Cardiovascular: rrr, no mrg  Respiratory: cta bl, no wheezes  Abdomen: soft, Nt, nd  Musculoskeletal: no cyanosis or clubbing.   Data Reviewed: Basic Metabolic Panel:  Recent Labs Lab 06/11/15 1018 06/12/15 0517  NA 136 134*  K 4.4 4.4  CL 98* 97*  CO2 27 25  GLUCOSE 152* 125*  BUN 11 13  CREATININE 0.87 0.72  CALCIUM 9.7 9.2   Liver Function Tests:  Recent Labs Lab 06/11/15 1018  AST 29  ALT 16*  ALKPHOS 173*  BILITOT 1.6*  PROT 7.7  ALBUMIN 3.7   No results for input(s): LIPASE, AMYLASE in the last 168 hours. No results for input(s): AMMONIA in the last 168 hours. CBC:  Recent Labs Lab 06/11/15 1106 06/12/15 0517  WBC 7.4 7.6  NEUTROABS 5.1  --   HGB 12.9* 14.0  HCT 37.3* 41.5  MCV 89.2 91.4  PLT 301 271   Cardiac Enzymes: No results for input(s): CKTOTAL, CKMB,  CKMBINDEX, TROPONINI in the last 168 hours. BNP (last 3 results)  Recent Labs  06/11/15 1106  BNP 27.7    ProBNP (last 3 results) No results for input(s): PROBNP in the last 8760 hours.  CBG:  Recent Labs Lab 06/11/15 2124 06/12/15 0728 06/12/15 1111  GLUCAP 101* 114* 121*    No results found for this or any previous visit (from the past 240 hour(s)).   Studies: Mr Lumbar Spine W Wo Contrast  06/11/2015  CLINICAL DATA:  Severe low back pain radiating to the left leg EXAM: MRI LUMBAR SPINE WITHOUT AND WITH CONTRAST TECHNIQUE: Multiplanar and multiecho pulse sequences of the lumbar spine were obtained without and with intravenous contrast. CONTRAST:  17mL MULTIHANCE GADOBENATE DIMEGLUMINE 529 MG/ML IV SOLN COMPARISON:  None. FINDINGS: Severe L5 vertebral body height loss. The vertebral bodies of the lumbar spine are normal in alignment. There is normal bone marrow signal demonstrated throughout the vertebra. Posterior lumbar fusion from L2 through S1. Severe L4-5 disc space narrowing. There is increased T2 signal within the L5-S1 disc space with mild disc enhancement. There is no paravertebral fluid collection or edema. The spinal cord is normal in signal and contour. The cord terminates normally at T12 . The nerve roots of the cauda equina and the filum terminale are normal. The visualized portions of the SI  joints are unremarkable. The imaged intra-abdominal contents are unremarkable. T12-L1: No significant disc bulge. No evidence of neural foraminal stenosis. No central canal stenosis. L1-L2: Mild broad-based disc bulge. No evidence of neural foraminal stenosis. Mild bilateral facet arthropathy. Mild spinal stenosis. L2-L3: No significant disc bulge. No evidence of neural foraminal stenosis. No central canal stenosis. L3-L4: Limited evaluation secondary to susceptibility artifact. No significant disc bulge. No evidence of neural foraminal stenosis. No central canal stenosis. L4-L5:  Limited evaluation secondary to susceptibility artifact. Severe disc height loss. No evidence of neural foraminal stenosis. No central canal stenosis. L5-S1: Increased T2 signal within the L5-S1 disc space with mild disc enhancement. Mild bilateral foraminal narrowing. No central canal stenosis. IMPRESSION: 1. Increased T2 signal within the L5-S1 disc space with mild disc enhancement. There is no adjacent marrow signal abnormality. There is no paravertebral soft tissue abnormality. This may reflect early discitis versus postsurgical changes. 2. Posterior lumbar fusion from L2 through S1. 3. Severe disc height loss at L4-5 and L5 vertebral body height loss which may be secondary to prior discitis or prior trauma. Electronically Signed   By: Elige Ko   On: 06/11/2015 12:34   Dg Toe 2nd Left  06/11/2015  CLINICAL DATA:  Fall today with second toe pain, initial encounter EXAM: LEFT SECOND TOE COMPARISON:  03/29/2015 FINDINGS: There is been interval amputation of the distal first toe with only a portion of the proximal phalanx are identified. Irregularity and increased sclerosis is noted just below the head of the second metatarsal consistent with subacute injury. No fracture is identified. No bony erosion to suggest osteomyelitis is seen. IMPRESSION: Postoperative change. Changes consistent with a subacute fracture in the distal second metatarsal. No other focal abnormality is seen. Electronically Signed   By: Alcide Clever M.D.   On: 06/11/2015 10:05   Dg Hip Unilat With Pelvis 2-3 Views Left  06/11/2015  CLINICAL DATA:  Pain following fall EXAM: DG HIP (WITH OR WITHOUT PELVIS) 2-3V LEFT COMPARISON:  November 30, 2006 FINDINGS: Frontal pelvis as well as frontal and lateral left hip images were obtained. There is slight symmetric narrowing of both hip joints. No fracture or dislocation. No erosive change. There is extensive postoperative change in the lower lumbar spine and upper sacral regions. IMPRESSION: No  acute fracture or dislocation. Mild symmetric narrowing both hip joints. Extensive postoperative change in the visualized lower lumbar spine and upper sacral regions. Electronically Signed   By: Bretta Bang III M.D.   On: 06/11/2015 09:45    Scheduled Meds: . cefTRIAXone (ROCEPHIN)  IV  1 g Intravenous Q24H  . gabapentin  300 mg Oral QID  . heparin  5,000 Units Subcutaneous 3 times per day  . insulin aspart  0-15 Units Subcutaneous TID WC  . insulin aspart  0-5 Units Subcutaneous QHS  . insulin aspart protamine- aspart  10 Units Subcutaneous Q supper   And  . insulin aspart protamine- aspart  22 Units Subcutaneous Q breakfast  . meloxicam  7.5 mg Oral Daily  . sodium chloride  3 mL Intravenous Q12H   Continuous Infusions:    Time spent: > 35 minutes    Penny Pia  [facility] Pager [phone] If 7PM-7AM, please contact night-coverage at [email or web address], password [facility] 06/12/2015, 2:29 PM

## 2015-06-13 DIAGNOSIS — M549 Dorsalgia, unspecified: Secondary | ICD-10-CM | POA: Diagnosis present

## 2015-06-13 DIAGNOSIS — Z79899 Other long term (current) drug therapy: Secondary | ICD-10-CM | POA: Diagnosis not present

## 2015-06-13 DIAGNOSIS — Z981 Arthrodesis status: Secondary | ICD-10-CM | POA: Diagnosis not present

## 2015-06-13 DIAGNOSIS — Z89412 Acquired absence of left great toe: Secondary | ICD-10-CM | POA: Diagnosis not present

## 2015-06-13 DIAGNOSIS — S72002D Fracture of unspecified part of neck of left femur, subsequent encounter for closed fracture with routine healing: Secondary | ICD-10-CM | POA: Diagnosis not present

## 2015-06-13 DIAGNOSIS — G8929 Other chronic pain: Secondary | ICD-10-CM | POA: Diagnosis present

## 2015-06-13 DIAGNOSIS — M545 Low back pain: Secondary | ICD-10-CM | POA: Diagnosis present

## 2015-06-13 DIAGNOSIS — E871 Hypo-osmolality and hyponatremia: Secondary | ICD-10-CM | POA: Diagnosis not present

## 2015-06-13 DIAGNOSIS — N390 Urinary tract infection, site not specified: Secondary | ICD-10-CM | POA: Diagnosis not present

## 2015-06-13 DIAGNOSIS — D62 Acute posthemorrhagic anemia: Secondary | ICD-10-CM | POA: Diagnosis not present

## 2015-06-13 DIAGNOSIS — E162 Hypoglycemia, unspecified: Secondary | ICD-10-CM | POA: Diagnosis not present

## 2015-06-13 DIAGNOSIS — M4646 Discitis, unspecified, lumbar region: Secondary | ICD-10-CM | POA: Diagnosis not present

## 2015-06-13 DIAGNOSIS — S72002G Fracture of unspecified part of neck of left femur, subsequent encounter for closed fracture with delayed healing: Secondary | ICD-10-CM | POA: Diagnosis not present

## 2015-06-13 DIAGNOSIS — E11649 Type 2 diabetes mellitus with hypoglycemia without coma: Secondary | ICD-10-CM | POA: Diagnosis not present

## 2015-06-13 DIAGNOSIS — Z794 Long term (current) use of insulin: Secondary | ICD-10-CM | POA: Diagnosis not present

## 2015-06-13 DIAGNOSIS — S72092D Other fracture of head and neck of left femur, subsequent encounter for closed fracture with routine healing: Secondary | ICD-10-CM | POA: Diagnosis not present

## 2015-06-13 DIAGNOSIS — R339 Retention of urine, unspecified: Secondary | ICD-10-CM | POA: Diagnosis not present

## 2015-06-13 DIAGNOSIS — T402X5A Adverse effect of other opioids, initial encounter: Secondary | ICD-10-CM | POA: Diagnosis not present

## 2015-06-13 DIAGNOSIS — M5442 Lumbago with sciatica, left side: Secondary | ICD-10-CM | POA: Diagnosis not present

## 2015-06-13 DIAGNOSIS — W19XXXA Unspecified fall, initial encounter: Secondary | ICD-10-CM | POA: Diagnosis present

## 2015-06-13 DIAGNOSIS — S72142A Displaced intertrochanteric fracture of left femur, initial encounter for closed fracture: Secondary | ICD-10-CM | POA: Diagnosis not present

## 2015-06-13 DIAGNOSIS — R000 Tachycardia, unspecified: Secondary | ICD-10-CM | POA: Diagnosis not present

## 2015-06-13 DIAGNOSIS — S7222XA Displaced subtrochanteric fracture of left femur, initial encounter for closed fracture: Secondary | ICD-10-CM | POA: Diagnosis not present

## 2015-06-13 DIAGNOSIS — M25552 Pain in left hip: Secondary | ICD-10-CM | POA: Diagnosis not present

## 2015-06-13 DIAGNOSIS — K5903 Drug induced constipation: Secondary | ICD-10-CM | POA: Diagnosis not present

## 2015-06-13 DIAGNOSIS — D72829 Elevated white blood cell count, unspecified: Secondary | ICD-10-CM | POA: Diagnosis not present

## 2015-06-13 LAB — GLUCOSE, CAPILLARY
GLUCOSE-CAPILLARY: 127 mg/dL — AB (ref 65–99)
GLUCOSE-CAPILLARY: 80 mg/dL (ref 65–99)
GLUCOSE-CAPILLARY: 155 mg/dL — AB (ref 65–99)
Glucose-Capillary: 104 mg/dL — ABNORMAL HIGH (ref 65–99)

## 2015-06-13 MED ORDER — METHOCARBAMOL 500 MG PO TABS
500.0000 mg | ORAL_TABLET | Freq: Once | ORAL | Status: AC
  Administered 2015-06-13: 500 mg via ORAL
  Filled 2015-06-13: qty 1

## 2015-06-13 NOTE — Evaluation (Signed)
Physical Therapy Evaluation Patient Details Name: Jeff FerrariHarold Ferrell MRN: [id number] DOB: 05/23/1951 Today's Date: 06/13/2015   History of Present Illness  64 yr old male admitted through ED with L hip pain, weakness, hx of falls and inability to ambulate at home.  Pt wtih reent hx of L great toe amp 03/30/15 and recent fall with multple l rib fxs.  Pt with hx significant back surgery and chronic back pain  Clinical Impression  Pt admitted as above and presenting with functional mobility limitations 2* pain and limited ability to WB L LE.  Pt currently unable to mobilize without significant assist and unable to provide self care if dc home alone.  Pt would benefit from follow up rehab at SNF level to maximize safety and IND prior to return home.    Follow Up Recommendations SNF    Equipment Recommendations       Recommendations for Other Services OT consult     Precautions / Restrictions Precautions Precautions: Fall Restrictions Weight Bearing Restrictions: No      Mobility  Bed Mobility Overal bed mobility: Needs Assistance Bed Mobility: Supine to Sit;Sit to Supine     Supine to sit: Min guard Sit to supine: Min assist;Mod assist   General bed mobility comments: Increased time.  Pt able to slowly transfer to EOB sitting but required assist to manage LEs into bed  Transfers Overall transfer level: Needs assistance Equipment used: Rolling walker (2 wheeled) Transfers: Sit to/from Stand Sit to Stand: Max assist;From elevated surface         General transfer comment: cues for use of UEs. Assist to bring wt up and balance in standing.  Pt unable to fully extend either LE and with min WB tolerance on L LE.  Stood x 4.  With each attempt, pt able to stand straighter and place increased weight on L LE.  Pt unable to attain fully erect posture..  Ambulation/Gait Ambulation/Gait assistance: Mod assist;+2 safety/equipment Ambulation Distance (Feet): 3 Feet Assistive device:  Rolling walker (2 wheeled) Gait Pattern/deviations: Step-to pattern;Decreased step length - right;Decreased step length - left;Shuffle;Trunk flexed Gait velocity: DEC   General Gait Details: Increased time.  Pt able to side step slowly up side of bed with RW and assist for balance/support.  Pain limited  Stairs            Wheelchair Mobility    Modified Rankin (Stroke Patients Only)       Balance Overall balance assessment: Needs assistance Sitting-balance support: No upper extremity supported Sitting balance-Leahy Scale: Fair     Standing balance support: Bilateral upper extremity supported Standing balance-Leahy Scale: Poor                               Pertinent Vitals/Pain Pain Assessment: 0-10 Pain Score: 10-Worst pain ever Pain Location: L hip - exacerbated by attempts to mobilize and WB Pain Descriptors / Indicators: Cramping;Aching;Jabbing;Shooting;Spasm;Throbbing;Tightness Pain Intervention(s): Limited activity within patient's tolerance;Monitored during session;Premedicated before session    Home Living Family/patient expects to be discharged to:: Private residence Living Arrangements: Alone   Type of Home: Apartment Home Access: Level entry     Home Layout: One level Home Equipment: Walker - 2 wheels      Prior Function Level of Independence: Independent with assistive device(s)         Comments: Pt states he had not eaten for several days prior to admit - unable to get to kitchen  Hand Dominance        Extremity/Trunk Assessment   Upper Extremity Assessment: Overall WFL for tasks assessed           Lower Extremity Assessment: RLE deficits/detail;LLE deficits/detail RLE Deficits / Details: ROM WFL but pt unable to fully assess 2* pain exacerbated on L  LLE Deficits / Details: Pt unable to fully extend L LE in supine or standing 2* pain  Cervical / Trunk Assessment: Kyphotic  Communication   Communication: No  difficulties  Cognition Arousal/Alertness: Awake/alert Behavior During Therapy: WFL for tasks assessed/performed Overall Cognitive Status: Within Functional Limits for tasks assessed                      General Comments      Exercises        Assessment/Plan    PT Assessment Patient needs continued PT services  PT Diagnosis Difficulty walking   PT Problem List Decreased strength;Decreased range of motion;Decreased activity tolerance;Decreased balance;Decreased mobility;Decreased knowledge of use of DME;Pain  PT Treatment Interventions DME instruction;Gait training;Stair training;Functional mobility training;Therapeutic activities;Therapeutic exercise;Balance training;Patient/family education   PT Goals (Current goals can be found in the Care Plan section) Acute Rehab PT Goals Patient Stated Goal: I need to be able to walk PT Goal Formulation: With patient Time For Goal Achievement: 06/26/15 Potential to Achieve Goals: Fair    Frequency Min 3X/week   Barriers to discharge        Co-evaluation               End of Session Equipment Utilized During Treatment: Gait belt Activity Tolerance: Patient limited by pain Patient left: in bed;with call bell/phone within reach Nurse Communication: Mobility status    Functional Assessment Tool Used: Clinical judgement Functional Limitation: Mobility: Walking and moving around Mobility: Walking and Moving Around Current Status [phone]): At least 60 percent but less than 80 percent impaired, limited or restricted Mobility: Walking and Moving Around Goal Status [phone]): At least 20 percent but less than 40 percent impaired, limited or restricted    Time: 1478-2956 PT Time Calculation (min) (ACUTE ONLY): 46 min   Charges:   PT Evaluation $Initial PT Evaluation Tier I: 1 Procedure PT Treatments $Therapeutic Activity: 23-37 mins   PT G Codes:   PT G-Codes **NOT FOR INPATIENT CLASS** Functional Assessment Tool Used:  Clinical judgement Functional Limitation: Mobility: Walking and moving around Mobility: Walking and Moving Around Current Status (O1308): At least 60 percent but less than 80 percent impaired, limited or restricted Mobility: Walking and Moving Around Goal Status [phone]): At least 20 percent but less than 40 percent impaired, limited or restricted    Jeff Ferrell 06/13/2015, 11:51 AM

## 2015-06-13 NOTE — Progress Notes (Signed)
[facility] PROGRESS NOTE  Jeff Ferrell UJW:[id number] DOB: April 05, 1951 DOA: 06/11/2015 PCP: [name], MD  Assessment/Plan: Principal Problem:   UTI (lower urinary tract infection) - Will continue Rocephin  Active Problems:   Back pain - continue supportive therapy - no further imaging test recommended for evaluation of back discomfort  Code Status: full Family Communication: none at bedside Disposition Plan: undergoing discharge planning, PT evaluation   Consultants:  Infectious disease  Procedures:  None  Antibiotics:  Rocephin  HPI/Subjective: Pt has no new complaints. No acute issues overnight.  Objective: Filed Vitals:   06/12/15 2135 06/13/15 0528  BP: 132/98 132/84  Pulse: 108 102  Temp: 98.2 F (36.8 C) 97.9 F (36.6 C)  Resp: 18 18    Intake/Output Summary (Last 24 hours) at 06/13/15 1149 Last data filed at 06/13/15 5621  Gross per 24 hour  Intake    960 ml  Output    650 ml  Net    310 ml   Filed Weights   06/11/15 0838  Weight: 83.462 kg (184 lb)    Exam:   General:  Pt in nad, alert and awake  Cardiovascular: rrr, no mrg  Respiratory: cta bl, no wheezes  Abdomen: soft, Nt, nd  Musculoskeletal: no cyanosis or clubbing.   Data Reviewed: Basic Metabolic Panel:  Recent Labs Lab 06/11/15 1018 06/12/15 0517  NA 136 134*  K 4.4 4.4  CL 98* 97*  CO2 27 25  GLUCOSE 152* 125*  BUN 11 13  CREATININE 0.87 0.72  CALCIUM 9.7 9.2   Liver Function Tests:  Recent Labs Lab 06/11/15 1018  AST 29  ALT 16*  ALKPHOS 173*  BILITOT 1.6*  PROT 7.7  ALBUMIN 3.7   No results for input(s): LIPASE, AMYLASE in the last 168 hours. No results for input(s): AMMONIA in the last 168 hours. CBC:  Recent Labs Lab 06/11/15 1106 06/12/15 0517  WBC 7.4 7.6  NEUTROABS 5.1  --   HGB 12.9* 14.0  HCT 37.3* 41.5  MCV 89.2 91.4  PLT 301 271   Cardiac Enzymes: No results for input(s): CKTOTAL, CKMB, CKMBINDEX, TROPONINI in  the last 168 hours. BNP (last 3 results)  Recent Labs  06/11/15 1106  BNP 27.7    ProBNP (last 3 results) No results for input(s): PROBNP in the last 8760 hours.  CBG:  Recent Labs Lab 06/12/15 1111 06/12/15 1640 06/12/15 1720 06/12/15 2130 06/13/15 0725  GLUCAP 121* 58* 70 97 127*    No results found for this or any previous visit (from the past 240 hour(s)).   Studies: Mr Lumbar Spine W Wo Contrast  06/11/2015  CLINICAL DATA:  Severe low back pain radiating to the left leg EXAM: MRI LUMBAR SPINE WITHOUT AND WITH CONTRAST TECHNIQUE: Multiplanar and multiecho pulse sequences of the lumbar spine were obtained without and with intravenous contrast. CONTRAST:  17mL MULTIHANCE GADOBENATE DIMEGLUMINE 529 MG/ML IV SOLN COMPARISON:  None. FINDINGS: Severe L5 vertebral body height loss. The vertebral bodies of the lumbar spine are normal in alignment. There is normal bone marrow signal demonstrated throughout the vertebra. Posterior lumbar fusion from L2 through S1. Severe L4-5 disc space narrowing. There is increased T2 signal within the L5-S1 disc space with mild disc enhancement. There is no paravertebral fluid collection or edema. The spinal cord is normal in signal and contour. The cord terminates normally at T12 . The nerve roots of the cauda equina and the filum terminale are normal. The visualized portions of the SI joints  are unremarkable. The imaged intra-abdominal contents are unremarkable. T12-L1: No significant disc bulge. No evidence of neural foraminal stenosis. No central canal stenosis. L1-L2: Mild broad-based disc bulge. No evidence of neural foraminal stenosis. Mild bilateral facet arthropathy. Mild spinal stenosis. L2-L3: No significant disc bulge. No evidence of neural foraminal stenosis. No central canal stenosis. L3-L4: Limited evaluation secondary to susceptibility artifact. No significant disc bulge. No evidence of neural foraminal stenosis. No central canal stenosis.  L4-L5: Limited evaluation secondary to susceptibility artifact. Severe disc height loss. No evidence of neural foraminal stenosis. No central canal stenosis. L5-S1: Increased T2 signal within the L5-S1 disc space with mild disc enhancement. Mild bilateral foraminal narrowing. No central canal stenosis. IMPRESSION: 1. Increased T2 signal within the L5-S1 disc space with mild disc enhancement. There is no adjacent marrow signal abnormality. There is no paravertebral soft tissue abnormality. This may reflect early discitis versus postsurgical changes. 2. Posterior lumbar fusion from L2 through S1. 3. Severe disc height loss at L4-5 and L5 vertebral body height loss which may be secondary to prior discitis or prior trauma. Electronically Signed   By: Elige KoHetal  Patel   On: 06/11/2015 12:34    Scheduled Meds: . cefTRIAXone (ROCEPHIN)  IV  1 g Intravenous Q24H  . gabapentin  300 mg Oral QID  . heparin  5,000 Units Subcutaneous 3 times per day  . insulin aspart  0-15 Units Subcutaneous TID WC  . insulin aspart  0-5 Units Subcutaneous QHS  . insulin aspart protamine- aspart  10 Units Subcutaneous Q supper   And  . insulin aspart protamine- aspart  22 Units Subcutaneous Q breakfast  . meloxicam  7.5 mg Oral Daily  . sodium chloride  3 mL Intravenous Q12H   Continuous Infusions:    Time spent: > 35 minutes    Penny PiaVEGA, Chris Cripps  [facility] Pager [phone] If 7PM-7AM, please contact night-coverage at [email or web address], password [email or web address] 06/13/2015, 11:49 AM

## 2015-06-14 LAB — GLUCOSE, CAPILLARY
GLUCOSE-CAPILLARY: 119 mg/dL — AB (ref 65–99)
GLUCOSE-CAPILLARY: 130 mg/dL — AB (ref 65–99)
Glucose-Capillary: 121 mg/dL — ABNORMAL HIGH (ref 65–99)
Glucose-Capillary: 83 mg/dL (ref 65–99)

## 2015-06-14 MED ORDER — FENTANYL 25 MCG/HR TD PT72
25.0000 ug | MEDICATED_PATCH | TRANSDERMAL | Status: DC
  Administered 2015-06-14: 25 ug via TRANSDERMAL
  Filled 2015-06-14: qty 1

## 2015-06-14 MED ORDER — OXYCODONE HCL ER 15 MG PO T12A: 15.0000 mg | EXTENDED_RELEASE_TABLET | Freq: Two times a day (BID) | ORAL | Status: DC

## 2015-06-14 NOTE — NC FL2 (Signed)
[place] MEDICAID FL2 LEVEL OF CARE SCREENING TOOL     IDENTIFICATION  Patient Name: Jeff Ferrell Birthdate: 01/22/1951 Sex: male Admission Date (Current Location): 06/11/2015  [facility] [place]   Facility and Address:  [facility],  [address], [place] [id number]      Provider Number: [id number]  Attending Physician Name and Address:  Penny Piarlando Vega, MD  Relative Name and Phone Number:       Current Level of Care: Hospital Recommended Level of Care: Skilled Nursing Facility Prior Approval Number:    Date Approved/Denied: 06/14/15 PASRR Number:  [id number])  Discharge Plan: SNF    Current Diagnoses: Patient Active Problem List   Diagnosis Date Noted  . UTI (lower urinary tract infection) 06/11/2015  . Back pain 06/11/2015  . 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 Malnutrition of moderate degree 03/30/2015  . Toe infection 03/29/2015  . Diabetic foot ulcer (HCC) 03/29/2015  . Diabetes mellitus (HCC) 03/29/2015  . Diabetic osteomyelitis (HCC) 03/29/2015    Orientation RESPIRATION BLADDER Height & Weight    Self, Time, Situation, Place  Normal Continent   184 lbs.  BEHAVIORAL SYMPTOMS/MOOD NEUROLOGICAL BOWEL NUTRITION STATUS      Continent Diet (Medium 1600-2000)  AMBULATORY STATUS COMMUNICATION OF NEEDS Skin   Extensive Assist Verbally Surgical wounds                       Personal Care Assistance Level of Assistance  Dressing, Bathing Bathing Assistance: Limited assistance   Dressing Assistance: Limited assistance     Functional Limitations Info             SPECIAL CARE FACTORS FREQUENCY                       Contractures Contractures Info: Not present    Additional Factors Info  Code Status, Allergies Code Status Info:  (Full Code ) Allergies Info:  (P[place]illians)           Current Medications (06/14/2015):  This is the current hospital active medication list Current  Facility-Administered Medications  Medication Dose Route Frequency Provider Last Rate Last Dose  . 0.9 %  sodium chloride infusion  250 mL Intravenous PRN Penny Piarlando Vega, MD      . acetaminophen (TYLENOL) tablet 650 mg  650 mg Oral Q6H PRN Penny Piarlando Vega, MD       Or  . acetaminophen (TYLENOL) suppository 650 mg  650 mg Rectal Q6H PRN Penny Piarlando Vega, MD      . cefTRIAXone (ROCEPHIN) 1 g in dextrose 5 % 50 mL IVPB  1 g Intravenous Q24H Maurice Marchachel E Jackson, RPH   1 g at 06/14/15 1536  . fentaNYL (DURAGESIC - dosed mcg/hr) patch 25 mcg  25 mcg Transdermal Q72H Penny Piarlando Vega, MD   25 mcg at 06/14/15 1241  . gabapentin (NEURONTIN) capsule 300 mg  300 mg Oral QID Penny Piarlando Vega, MD   300 mg at 06/14/15 1418  . heparin injection 5,000 Units  5,000 Units Subcutaneous 3 times per day Penny Piarlando Vega, MD   5,000 Units at 06/14/15 1418  . insulin aspart (novoLOG) injection 0-15 Units  0-15 Units Subcutaneous TID WC Penny Piarlando Vega, MD   2 Units at 06/14/15 0830  . insulin aspart (novoLOG) injection 0-5 Units  0-5 Units Subcutaneous QHS Penny Piarlando Vega, MD   0 Units at 06/11/15 2200  . insulin aspart protamine- aspart (NOVOLOG MIX 70/30) injection 10 Units  10  Units Subcutaneous Q supper Penny Pia, MD   10 Units at 06/13/15 1806   And  . insulin aspart protamine- aspart (NOVOLOG MIX 70/30) injection 22 Units  22 Units Subcutaneous Q breakfast Penny Pia, MD   22 Units at 06/14/15 0831  . meloxicam (MOBIC) tablet 7.5 mg  7.5 mg Oral Daily Penny Pia, MD   7.5 mg at 06/14/15 0940  . ondansetron (ZOFRAN) tablet 4 mg  4 mg Oral Q6H PRN Penny Pia, MD       Or  . ondansetron (ZOFRAN) injection 4 mg  4 mg Intravenous Q6H PRN Penny Pia, MD      . oxyCODONE (Oxy IR/ROXICODONE) immediate release tablet 5 mg  5 mg Oral Q4H PRN Penny Pia, MD   5 mg at 06/14/15 1538  . sodium chloride 0.9 % injection 3 mL  3 mL Intravenous Q12H Penny Pia, MD   3 mL at 06/13/15 0946  . sodium chloride 0.9 % injection 3 mL  3 mL Intravenous  PRN Penny Pia, MD         Discharge Medications: Please see discharge summary for a list of discharge medications.  Relevant Imaging Results:  Relevant Lab Results:   Additional Information  (SSN:  [id number])  Leron Croak

## 2015-06-14 NOTE — Clinical Social Work Note (Signed)
Clinical Social Work Assessment  Patient Details  Name: Jeff Ferrell MRN: [id number] Date of Birth: Sep 13, 1950  Date of referral:  06/14/15               Reason for consult:  Discharge Planning                Permission sought to share information with:  Chartered certified accountant granted to share information::  Yes, Verbal Permission Granted  Name::      (Contracted VA SNF facilities and VA contact )  Agency::   [facility] and SNF facilities. )  Relationship::     Contact Information:     Housing/Transportation Living arrangements for the past 2 months:  Apartment Source of Information:  Patient Patient Interpreter Needed:    Criminal Activity/Legal Involvement Pertinent to Current Situation/Hospitalization:  No - Comment as needed (Pt denies any criminal activity/leagal issues) Significant Relationships:  Friend, Adult Children Lives with:    Do you feel safe going back to the place where you live?  No (Pt realizes that he may not be able to take care of himself.) Need for family participation in patient care:  No (Coment)  Care giving concerns: Pt is own care giver. He does have concerns that he will not be able to take care of himself if he were to go back to his apartment where he lives alone.   Social Worker assessment / plan: CSW met with the Pt at the bedside. CSW introduced self and explained that CSW will be working with the [place] for Placement. Pt voiced understanding. Pt explained he came in for a UTI and severe back pain. Pt was visibly in a lot of pain as exhibited by Pt moving slowly and grimacing when trying to reposition. Pt stated he recently moved into his apartment and is concerned he may lose the apartment if he were to need to stay in a SNF for a period of time. Pt stated he, "receives a pension from the [employer] CSW explained the placement process to the Pt and gave a listing of the five facilities approved by [place]. CSW also provided the financial form  for the Pt to complete and return to [place] on 06/15/2015. Pt is unable to read the handout and is having his son bring his glasses to Pt. Pt is agreeable to SNF. CSW will send Pt information to noted facilities and covering [place] will f/u with Pt and [facility] [facility] contact.   Employment status:  Disabled (Comment on whether or not currently receiving Disability) Insurance information:  VA Benefit PT Recommendations:  [place] / Referral to community resources:  [place]  Patient/Family's Response to care:  Pt is concerned about placement and was even contemplating going home even though he can not walk.  Patient/Family's Understanding of and Emotional Response to Diagnosis, Current Treatment, and Prognosis:  Pt is pleased with current treatment, however would like to remain in the immediate area and is frustrated with limited choices.   Emotional Assessment Appearance:  Appears older than stated age Attitude/Demeanor/Rapport:    Affect (typically observed):  Accepting, Overwhelmed, Appropriate, Hopeful Orientation:  Oriented to Self, Oriented to Place, Oriented to  Time, Oriented to Situation Alcohol / Substance use:  Never Used Psych involvement (Current and /or in the community):  No (Comment)  Discharge Needs  Concerns to be addressed:  Discharge Planning Concerns Readmission within the last 30 days:    Current discharge risk:  Dependent with Mobility Barriers  to Discharge:  [place] 06/14/2015, 3:20 PM

## 2015-06-14 NOTE — Progress Notes (Signed)
CSW [facility] to Fax # [phone] for SNF approval.   CSW also sent clinicals manually to [facility] and [facility].  CSW provided Pt with a VA Form 10-10ac to complete and give to the CSW tomorrow.   Covering CSW will continue to work Pt up for SNF placement.    Leron Croakassandra Jahniah Pallas [facility]entin N BurdickCSWA  [facility]  [phone]

## 2015-06-14 NOTE — Progress Notes (Signed)
[facility] PROGRESS NOTE  Guinevere FerrariHarold Mulgrew ZOX:[id number] DOB: 04/28/1951 DOA: 06/11/2015 PCP: Daisy FloroWYMER,ANTOINETTE, MD  Assessment/Plan: Principal Problem:   UTI (lower urinary tract infection) - Will continue Rocephin  Active Problems:   Back pain - Pain continues to limit patient mobility. Will change pain medication regimen to fentanyl with oxy ir and meloxicam on board. - no further imaging test recommended for evaluation of back discomfort  Code Status: full Family Communication: none at bedside Disposition Plan: PT recommending SNF on d/c   Consultants:  Infectious disease  Procedures:  None  Antibiotics:  Rocephin  HPI/Subjective: Pt states the pain pills he is getting is not providing enough pain relief  Objective: Filed Vitals:   06/13/15 2156 06/14/15 0554  BP: 137/87 132/82  Pulse: 114 120  Temp: 96.8 F (36 C) 97.5 F (36.4 C)  Resp: 19 20    Intake/Output Summary (Last 24 hours) at 06/14/15 1632 Last data filed at 06/14/15 1058  Gross per 24 hour  Intake    840 ml  Output    675 ml  Net    165 ml   Filed Weights   06/11/15 0838  Weight: 83.462 kg (184 lb)    Exam:   General:  Pt in nad, alert and awake  Cardiovascular: rrr, no mrg  Respiratory: cta bl, no wheezes  Abdomen: soft, Nt, nd  Musculoskeletal: no cyanosis or clubbing.   Data Reviewed: Basic Metabolic Panel:  Recent Labs Lab 06/11/15 1018 06/12/15 0517  NA 136 134*  K 4.4 4.4  CL 98* 97*  CO2 27 25  GLUCOSE 152* 125*  BUN 11 13  CREATININE 0.87 0.72  CALCIUM 9.7 9.2   Liver Function Tests:  Recent Labs Lab 06/11/15 1018  AST 29  ALT 16*  ALKPHOS 173*  BILITOT 1.6*  PROT 7.7  ALBUMIN 3.7   No results for input(s): LIPASE, AMYLASE in the last 168 hours. No results for input(s): AMMONIA in the last 168 hours. CBC:  Recent Labs Lab 06/11/15 1106 06/12/15 0517  WBC 7.4 7.6  NEUTROABS 5.1  --   HGB 12.9* 14.0  HCT 37.3* 41.5  MCV 89.2 91.4   PLT 301 271   Cardiac Enzymes: No results for input(s): CKTOTAL, CKMB, CKMBINDEX, TROPONINI in the last 168 hours. BNP (last 3 results)  Recent Labs  06/11/15 1106  BNP 27.7    ProBNP (last 3 results) No results for input(s): PROBNP in the last 8760 hours.  CBG:  Recent Labs Lab 06/13/15 1200 06/13/15 1640 06/13/15 2155 06/14/15 0753 06/14/15 1218  GLUCAP 80 155* 104* 121* 83    No results found for this or any previous visit (from the past 240 hour(s)).   Studies: No results found.  Scheduled Meds: . cefTRIAXone (ROCEPHIN)  IV  1 g Intravenous Q24H  . fentaNYL  25 mcg Transdermal Q72H  . gabapentin  300 mg Oral QID  . heparin  5,000 Units Subcutaneous 3 times per day  . insulin aspart  0-15 Units Subcutaneous TID WC  . insulin aspart  0-5 Units Subcutaneous QHS  . insulin aspart protamine- aspart  10 Units Subcutaneous Q supper   And  . insulin aspart protamine- aspart  22 Units Subcutaneous Q breakfast  . meloxicam  7.5 mg Oral Daily  . sodium chloride  3 mL Intravenous Q12H   Continuous Infusions:    Time spent: > 35 minutes    Penny PiaVEGA, Jadis Pitter  [facility] Pager [phone] If 7PM-7AM, please contact night-coverage at [email or web address],  password [facility] 06/14/2015, 4:32 PM  LOS: 1 day

## 2015-06-15 LAB — GLUCOSE, CAPILLARY
GLUCOSE-CAPILLARY: 142 mg/dL — AB (ref 65–99)
Glucose-Capillary: 48 mg/dL — ABNORMAL LOW (ref 65–99)
Glucose-Capillary: 68 mg/dL (ref 65–99)
Glucose-Capillary: 112 mg/dL — ABNORMAL HIGH (ref 65–99)
Glucose-Capillary: 87 mg/dL (ref 65–99)

## 2015-06-15 MED ORDER — FENTANYL 50 MCG/HR TD PT72
50.0000 ug | MEDICATED_PATCH | TRANSDERMAL | Status: DC
  Administered 2015-06-17 – 2015-06-20 (×2): 50 ug via TRANSDERMAL
  Filled 2015-06-15 (×2): qty 1

## 2015-06-15 MED ORDER — INSULIN ASPART PROT & ASPART (70-30 MIX) 100 UNIT/ML ~~LOC~~ SUSP
15.0000 [IU] | Freq: Every day | SUBCUTANEOUS | Status: DC
  Administered 2015-06-16: 15 [IU] via SUBCUTANEOUS
  Filled 2015-06-15: qty 10

## 2015-06-15 MED ORDER — INSULIN ASPART PROT & ASPART (70-30 MIX) 100 UNIT/ML ~~LOC~~ SUSP
10.0000 [IU] | Freq: Every day | SUBCUTANEOUS | Status: DC
  Administered 2015-06-15: 10 [IU] via SUBCUTANEOUS
  Filled 2015-06-15: qty 10

## 2015-06-15 NOTE — Progress Notes (Signed)
[facility] PROGRESS NOTE  Cassady Turano ZOX:[id number] DOB: 03-02-1951 DOA: 06/11/2015 PCP: [name], MD  Brief Narrative  64 year old who presented with discitis not felt to be secondary to infectious etiology. No further imaging studies recommended by infectious disease. Currently presenting with intractable discomfort affecting his ability to ambulate.  Assessment/Plan: Principal Problem:   UTI (lower urinary tract infection) - Will continue Rocephin  Active Problems:   Back pain - Pain continues to limit patient mobility. We will increase fentanyl patch to 50 g as patient reports pain still not well controlled. - no further imaging test recommended for evaluation of back discomfort by infectious disease - Patient to continue physical therapy  Code Status: full Family Communication: none at bedside Disposition Plan: PT recommending SNF on d/c   Consultants:  Infectious disease  Procedures:  None  Antibiotics:  Rocephin  HPI/Subjective: Pt reports he still having discomfort which is affecting his ambulation  Objective: Filed Vitals:   06/15/15 0523 06/15/15 1340  BP: 121/86 109/71  Pulse: 122 117  Temp: 98.1 F (36.7 C) 97.3 F (36.3 C)  Resp: 20 18    Intake/Output Summary (Last 24 hours) at 06/15/15 1408 Last data filed at 06/15/15 0845  Gross per 24 hour  Intake    240 ml  Output    400 ml  Net   -160 ml   Filed Weights   06/11/15 0838  Weight: 83.462 kg (184 lb)    Exam:   General:  Pt in nad, alert and awake  Cardiovascular: rrr, no mrg  Respiratory: cta bl, no wheezes  Abdomen: soft, Nt, nd  Musculoskeletal: no cyanosis or clubbing.   Data Reviewed: Basic Metabolic Panel:  Recent Labs Lab 06/11/15 1018 06/12/15 0517  NA 136 134*  K 4.4 4.4  CL 98* 97*  CO2 27 25  GLUCOSE 152* 125*  BUN 11 13  CREATININE 0.87 0.72  CALCIUM 9.7 9.2   Liver Function Tests:  Recent Labs Lab 06/11/15 1018  AST 29  ALT  16*  ALKPHOS 173*  BILITOT 1.6*  PROT 7.7  ALBUMIN 3.7   No results for input(s): LIPASE, AMYLASE in the last 168 hours. No results for input(s): AMMONIA in the last 168 hours. CBC:  Recent Labs Lab 06/11/15 1106 06/12/15 0517  WBC 7.4 7.6  NEUTROABS 5.1  --   HGB 12.9* 14.0  HCT 37.3* 41.5  MCV 89.2 91.4  PLT 301 271   Cardiac Enzymes: No results for input(s): CKTOTAL, CKMB, CKMBINDEX, TROPONINI in the last 168 hours. BNP (last 3 results)  Recent Labs  06/11/15 1106  BNP 27.7    ProBNP (last 3 results) No results for input(s): PROBNP in the last 8760 hours.  CBG:  Recent Labs Lab 06/14/15 1730 06/14/15 2256 06/15/15 0724 06/15/15 1223 06/15/15 1251  GLUCAP 119* 130* 142* 48* 68    No results found for this or any previous visit (from the past 240 hour(s)).   Studies: No results found.  Scheduled Meds: . cefTRIAXone (ROCEPHIN)  IV  1 g Intravenous Q24H  . [START ON 06/17/2015] fentaNYL  50 mcg Transdermal Q72H  . gabapentin  300 mg Oral QID  . heparin  5,000 Units Subcutaneous 3 times per day  . insulin aspart  0-15 Units Subcutaneous TID WC  . insulin aspart  0-5 Units Subcutaneous QHS  . insulin aspart protamine- aspart  10 Units Subcutaneous Q supper   And  . insulin aspart protamine- aspart  22 Units Subcutaneous Q breakfast  .  meloxicam  7.5 mg Oral Daily  . sodium chloride  3 mL Intravenous Q12H   Continuous Infusions:    Time spent: > 35 minutes    Penny PiaVEGA, Tatsuo Musial  [facility] Pager [phone] If 7PM-7AM, please contact night-coverage at [email or web address], password [email or web address] 06/15/2015, 2:08 PM  LOS: 2 days

## 2015-06-15 NOTE — Progress Notes (Signed)
CRITICAL VALUE ALERT  Critical value received: CBG 48  Date of notification:  06/15/2015  Time of notification:  1240  Critical value read back: CBG 48  Nurse who received alert:    MD notified (1st page):  Cena BentonVega  Time of first page:  1248  MD notified (2nd page):  Time of second page:  Responding MD:  Cena BentonVega  Time MD responded:

## 2015-06-15 NOTE — Progress Notes (Signed)
Physical Therapy Treatment Patient Details Name: Jeff Ferrell MRN: [id number] DOB: 11/03/1950 Today's Date: 06/15/2015    History of Present Illness 64 yr old male admitted through ED with L hip pain, weakness, hx of falls and inability to ambulate at home.  Pt wtih recent hx of L great toe amp 03/30/15 and recent fall with multiple rib fxs.  Pt with hx significant back surgery and chronic back pain    PT Comments    Pt continues to have limited mobility due to L hip pain.  Attempted standing total of of 5 times and pt unable to achieve full extension/erect posture.  Pt also reports numbness in L foot and difficulty WBing due to inability to feel floor (as well as L hip pain).  Continue to recommend SNF upon d/c.   Follow Up Recommendations  SNF     Equipment Recommendations  None recommended by PT    Recommendations for Other Services       Precautions / Restrictions Precautions Precautions: Fall Restrictions Weight Bearing Restrictions: Yes    Mobility  Bed Mobility Overal bed mobility: Needs Assistance Bed Mobility: Supine to Sit;Sit to Supine     Supine to sit: Min guard Sit to supine: Mod assist   General bed mobility comments: increased time and effort to get to EOB, pt agreeable to assist of LEs onto bed slowly for better pain control  Transfers Overall transfer level: Needs assistance Equipment used: Rolling walker (2 wheeled) Transfers: Sit to/from Stand Sit to Stand: Min assist;Mod assist         General transfer comment: verbal cues cues for trunk flexion and then extension of LEs and trunk (demonstrated nose over toes technique so pt could extend LEs first however pt unable to attain erect posture), attempted x3 with pt in increased flexion, then utilized stedy however pt still remains unable to achieve full extension - performed standing x2  Ambulation/Gait                 Stairs            Wheelchair Mobility    Modified Rankin  (Stroke Patients Only)       Balance                                    Cognition Arousal/Alertness: Awake/alert Behavior During Therapy: WFL for tasks assessed/performed Overall Cognitive Status: Within Functional Limits for tasks assessed                      Exercises      General Comments        Pertinent Vitals/Pain Pain Assessment: 0-10 Pain Score: 10-Worst pain ever Pain Location: L hip exacerbated by movement Pain Descriptors / Indicators: Discomfort;Grimacing;Guarding;Stabbing Pain Intervention(s): Limited activity within patient's tolerance;Monitored during session;Premedicated before session;Repositioned    Home Living                      Prior Function            PT Goals (current goals can now be found in the care plan section) Progress towards PT goals: Progressing toward goals    Frequency  Min 3X/week    PT Plan Current plan remains appropriate    Co-evaluation             End of Session Equipment Utilized During Treatment: Gait belt Activity Tolerance:  Patient limited by pain Patient left: in bed;with call bell/phone within reach;with bed alarm set     Time: [id number] PT Time Calculation (min) (ACUTE ONLY): 30 min  Charges:  $Therapeutic Activity: 23-37 mins                    G Codes:      Vanetta Rule,KATHrine E 06/15/2015, 12:38 PM Zenovia JarredKati Uchechukwu Dhawan, PT, DPT 06/15/2015 Pager: [phone]

## 2015-06-15 NOTE — Clinical Social Work Note (Addendum)
CSW met with pt at bedside and confirmed he was given a copy of the 10-10EC regarding his VA benefits. CSW also left a Advertising account executive for [employer] (VA social worker in [place]; [phone] Ext [phone]) to confirm the 10-10EC was necessary.   Pt in unable to read documentation because he doesn't have his glasses with him. CSW confirmed that pt's son will bring his glasses to the hospital either this afternoon or tomorrow morning. CSW offered to contact pt's son however pt refused stating he will contact his son himself. CSW also offered to contact pt's emergency contact, Julieanne Manson who is listed as his spouse. Pt refused stating this is his "ex."   Cindra Presume, LCSW [phone] Hospital psychiatric & 5E, 5W [id number] Licensed Clinical Social Worker

## 2015-06-16 ENCOUNTER — Inpatient Hospital Stay (HOSPITAL_COMMUNITY): Payer: Medicare Other

## 2015-06-16 ENCOUNTER — Encounter (HOSPITAL_COMMUNITY): Payer: Self-pay | Admitting: Internal Medicine

## 2015-06-16 DIAGNOSIS — E162 Hypoglycemia, unspecified: Secondary | ICD-10-CM | POA: Diagnosis present

## 2015-06-16 DIAGNOSIS — Z794 Long term (current) use of insulin: Secondary | ICD-10-CM

## 2015-06-16 DIAGNOSIS — M5442 Lumbago with sciatica, left side: Secondary | ICD-10-CM

## 2015-06-16 DIAGNOSIS — N390 Urinary tract infection, site not specified: Secondary | ICD-10-CM

## 2015-06-16 DIAGNOSIS — E11649 Type 2 diabetes mellitus with hypoglycemia without coma: Secondary | ICD-10-CM

## 2015-06-16 LAB — CBC
HEMATOCRIT: 37.9 % — AB (ref 39.0–52.0)
Hemoglobin: 12.7 g/dL — ABNORMAL LOW (ref 13.0–17.0)
MCH: 30 pg (ref 26.0–34.0)
MCHC: 33.5 g/dL (ref 30.0–36.0)
MCV: 89.4 fL (ref 78.0–100.0)
PLATELETS: 268 10*3/uL (ref 150–400)
RBC: 4.24 MIL/uL (ref 4.22–5.81)
RDW: 12.2 % (ref 11.5–15.5)
WBC: 7.8 10*3/uL (ref 4.0–10.5)

## 2015-06-16 LAB — GLUCOSE, CAPILLARY
GLUCOSE-CAPILLARY: 49 mg/dL — AB (ref 65–99)
GLUCOSE-CAPILLARY: 111 mg/dL — AB (ref 65–99)
Glucose-Capillary: 98 mg/dL (ref 65–99)
Glucose-Capillary: 94 mg/dL (ref 65–99)
Glucose-Capillary: 79 mg/dL (ref 65–99)

## 2015-06-16 LAB — BASIC METABOLIC PANEL
ANION GAP: 10 (ref 5–15)
BUN: 12 mg/dL (ref 6–20)
CALCIUM: 9.4 mg/dL (ref 8.9–10.3)
CO2: 27 mmol/L (ref 22–32)
Chloride: 95 mmol/L — ABNORMAL LOW (ref 101–111)
Creatinine, Ser: 0.68 mg/dL (ref 0.61–1.24)
GFR calc Af Amer: >60 mL/min (ref >60)
GFR calc non Af Amer: >60 mL/min (ref >60)
GLUCOSE: 97 mg/dL (ref 65–99)
POTASSIUM: 4.1 mmol/L (ref 3.5–5.1)
Sodium: 132 mmol/L — ABNORMAL LOW (ref 135–145)

## 2015-06-16 MED ORDER — GADOBENATE DIMEGLUMINE 529 MG/ML IV SOLN
20.0000 mL | Freq: Once | INTRAVENOUS | Status: AC | PRN
  Administered 2015-06-16: 17 mL via INTRAVENOUS

## 2015-06-16 MED ORDER — METHOCARBAMOL 500 MG PO TABS
500.0000 mg | ORAL_TABLET | Freq: Four times a day (QID) | ORAL | Status: DC | PRN
  Administered 2015-06-16 – 2015-06-17 (×3): 500 mg via ORAL
  Filled 2015-06-16 (×3): qty 1

## 2015-06-16 MED ORDER — INSULIN ASPART PROT & ASPART (70-30 MIX) 100 UNIT/ML ~~LOC~~ SUSP: 10.0000 [IU] | Freq: Every day | SUBCUTANEOUS | Status: DC

## 2015-06-16 MED ORDER — OXYCODONE HCL 5 MG PO TABS
10.0000 mg | ORAL_TABLET | ORAL | Status: DC | PRN
  Administered 2015-06-16 – 2015-06-18 (×7): 10 mg via ORAL
  Filled 2015-06-16 (×7): qty 2

## 2015-06-16 MED ORDER — INSULIN ASPART PROT & ASPART (70-30 MIX) 100 UNIT/ML ~~LOC~~ SUSP
10.0000 [IU] | Freq: Every day | SUBCUTANEOUS | Status: DC
  Administered 2015-06-16: 10 [IU] via SUBCUTANEOUS

## 2015-06-16 MED ORDER — ENOXAPARIN SODIUM 40 MG/0.4ML ~~LOC~~ SOLN
40.0000 mg | SUBCUTANEOUS | Status: DC
  Administered 2015-06-16 – 2015-06-17 (×2): 40 mg via SUBCUTANEOUS
  Filled 2015-06-16 (×2): qty 0.4

## 2015-06-16 NOTE — Progress Notes (Signed)
Inpatient Diabetes Program Recommendations  AACE/ADA: New Consensus Statement on Inpatient Glycemic Control (2015)  Target Ranges:  Prepandial:   less than 140 mg/dL      Peak postprandial:   less than 180 mg/dL (1-2 hours)      Critically ill patients:  140 - 180 mg/dL    Results for Jeff Ferrell, Jeff Ferrell (MRN [id number]) as of 06/16/2015 13:56  Ref. Range 06/15/2015 07:24 06/15/2015 12:23 06/15/2015 12:51 06/15/2015 16:55 06/15/2015 20:47  Glucose-Capillary Latest Ref Range: 65-99 mg/dL 119142 (H) 48 (L) 68 147112 (H) 87    Results for Jeff Ferrell, Jeff Ferrell (MRN [id number]) as of 06/16/2015 13:56  Ref. Range 06/16/2015 07:30 06/16/2015 11:55 06/16/2015 12:33  Glucose-Capillary Latest Ref Range: 65-99 mg/dL 98 49 (L) 94    Home DM Meds: 70/30 insulin- 22 units AM/ 10 units PM       Glipizide 10 mg daily  Current Insulin Orders: 70/30 insulin- 15 units AM/ 10 units PM     -Note AM dose 70/30 insulin decreased this AM to 15 units with breakfast.  -Patient again Hypoglycemic at 12PM today after receiving 15 units 70/30 insulin this AM.  -Note Novolog SSI stopped.    MD- Please consider reducing AM dose of 70/30 insulin further to 10 units QAM     --Will follow patient during hospitalization--  Ambrose FinlandJeannine Johnston Jaycob Mcclenton RN, MSN, CDE Diabetes Coordinator Inpatient Glycemic Control Team Team Pager: [phone] (8a-5p)

## 2015-06-16 NOTE — Progress Notes (Addendum)
Progress Note   Jeff Ferrell:[id number] DOB: [birth year] DOA: 06/11/2015 PCP: [name], MD   Brief Narrative:   Jeff Ferrell is an 64 y.o. male with PMH of multiple back surgeries including lumbar fusion of L2-S1 [facility] who [facility]/11/15 with a 2 day history of back pain unrelieved by his usual home medications. Was also found to have a UTI.  Assessment/Plan:   Principal Problem:   Back pain/left hip pain - No further imaging recommended by ID. - Continue physical therapy. - Fentanyl patch increased to 50 g daily. Continue Neurontin and Mobic. - We'll ask orthopedic surgeon to evaluate.    UTI (lower urinary tract infection) - Continue Rocephin. It does not appear that urine cultures were ever sent. Will send.  Active Problems:   Diabetes mellitus (HCC) - Currently being managed with moderate scale SSI and 70/30 insulin. CBGs 48-112. - Discontinue the SSI. Decrease 70/30.    DVT Prophylaxis - Lovenox ordered.   Family Communication/Anticipated D/C date and plan/Code Status   Family Communication: No family currently at the bedside. Disposition Plan: Home when stable. Anticipated D/C date:   2 days, depending on pain control. Code Status: Full code.  IV Access:    Peripheral IV   Procedures and diagnostic studies:   Mr Lumbar Spine W Wo Contrast  06/11/2015  CLINICAL DATA:  Severe low back pain radiating to the left leg EXAM: MRI LUMBAR SPINE WITHOUT AND WITH CONTRAST TECHNIQUE: Multiplanar and multiecho pulse sequences of the lumbar spine were obtained without and with intravenous contrast. CONTRAST:  17mL MULTIHANCE GADOBENATE DIMEGLUMINE 529 MG/ML IV SOLN COMPARISON:  None. FINDINGS: Severe L5 vertebral body height loss. The vertebral bodies of the lumbar spine are normal in alignment. There is normal bone marrow signal demonstrated throughout the vertebra. Posterior lumbar fusion from L2  through S1. Severe L4-5 disc space narrowing. There is increased T2 signal within the L5-S1 disc space with mild disc enhancement. There is no paravertebral fluid collection or edema. The spinal cord is normal in signal and contour. The cord terminates normally at T12 . The nerve roots of the cauda equina and the filum terminale are normal. The visualized portions of the SI joints are unremarkable. The imaged intra-abdominal contents are unremarkable. T12-L1: No significant disc bulge. No evidence of neural foraminal stenosis. No central canal stenosis. L1-L2: Mild broad-based disc bulge. No evidence of neural foraminal stenosis. Mild bilateral facet arthropathy. Mild spinal stenosis. L2-L3: No significant disc bulge. No evidence of neural foraminal stenosis. No central canal stenosis. L3-L4: Limited evaluation secondary to susceptibility artifact. No significant disc bulge. No evidence of neural foraminal stenosis. No central canal stenosis. L4-L5: Limited evaluation secondary to susceptibility artifact. Severe disc height loss. No evidence of neural foraminal stenosis. No central canal stenosis. L5-S1: Increased T2 signal within the L5-S1 disc space with mild disc enhancement. Mild bilateral foraminal narrowing. No central canal stenosis. IMPRESSION: 1. Increased T2 signal within the L5-S1 disc space with mild disc enhancement. There is no adjacent marrow signal abnormality. There is no paravertebral soft tissue abnormality. This may reflect early discitis versus postsurgical changes. 2. Posterior lumbar fusion from L2 through S1. 3. Severe disc height loss at L4-5 and L5 vertebral body height loss which may be secondary to prior discitis or prior trauma. Electronically Signed   By: Elige Ko   On: 06/11/2015 12:34   Dg Toe 2nd Left  06/11/2015  CLINICAL DATA:  Fall today  with second toe pain, initial encounter EXAM: LEFT SECOND TOE COMPARISON:  03/29/2015 FINDINGS: There is been interval amputation of the  distal first toe with only a portion of the proximal phalanx are identified. Irregularity and increased sclerosis is noted just below the head of the second metatarsal consistent with subacute injury. No fracture is identified. No bony erosion to suggest osteomyelitis is seen. IMPRESSION: Postoperative change. Changes consistent with a subacute fracture in the distal second metatarsal. No other focal abnormality is seen. Electronically Signed   By: Alcide CleverMark  Lukens M.D.   On: 06/11/2015 10:05   Dg Hip Unilat With Pelvis 2-3 Views Left  06/11/2015  CLINICAL DATA:  Pain following fall EXAM: DG HIP (WITH OR WITHOUT PELVIS) 2-3V LEFT COMPARISON:  November 30, 2006 FINDINGS: Frontal pelvis as well as frontal and lateral left hip images were obtained. There is slight symmetric narrowing of both hip joints. No fracture or dislocation. No erosive change. There is extensive postoperative change in the lower lumbar spine and upper sacral regions. IMPRESSION: No acute fracture or dislocation. Mild symmetric narrowing both hip joints. Extensive postoperative change in the visualized lower lumbar spine and upper sacral regions. Electronically Signed   By: Bretta BangWilliam  Woodruff III M.D.   On: 06/11/2015 09:45     Medical Consultants:    Orthopedics  Anti-Infectives:   Rocephin 06/11/15--->  Subjective:   Jeff Ferrell tells me he is having severe pain in his left hip.  He tells me he feels like his hip is going out of its socket. He also reports some lower back pain. He reports muscle spasms with movement. He tells me that he is unable to feel with his left foot and that he has been crawling on the floor to get around. His knees and lower extremities have multiple abrasions on them.  Objective:    Filed Vitals:   06/15/15 0523 06/15/15 1340 06/15/15 2116 06/16/15 0536  BP: 121/86 109/71 120/70 113/72  Pulse: 122 117 113 113  Temp: 98.1 F (36.7 C) 97.3 F (36.3 C) 98.6 F (37 C) 99 F (37.2 C)  TempSrc:  Oral Oral Oral Oral  Resp: 20 18 18 18   Height:      Weight:      SpO2: 94% 95% 96% 100%    Intake/Output Summary (Last 24 hours) at 06/16/15 0908 Last data filed at 06/16/15 0449  Gross per 24 hour  Intake     70 ml  Output   1050 ml  Net   -980 ml   Filed Weights   06/11/15 0838  Weight: 83.462 kg (184 lb)    Exam: Gen:  Moderate distress secondary to pain Cardiovascular:  Tachycardic, No M/R/G Respiratory:  Lungs CTAB Gastrointestinal:  Abdomen soft, NT/ND, + BS Extremities:  No C/E/C, knees and anterior legs with multiple scabs   Data Reviewed:    Labs: Basic Metabolic Panel:  Recent Labs Lab 06/11/15 1018 06/12/15 0517 06/16/15 0445  NA 136 134* 132*  K 4.4 4.4 4.1  CL 98* 97* 95*  CO2 27 25 27   GLUCOSE 152* 125* 97  BUN 11 13 12   CREATININE 0.87 0.72 0.68  CALCIUM 9.7 9.2 9.4   GFR Estimated Creatinine Clearance: 92.1 mL/min (by C-G formula based on Cr of 0.68). Liver Function Tests:  Recent Labs Lab 06/11/15 1018  AST 29  ALT 16*  ALKPHOS 173*  BILITOT 1.6*  PROT 7.7  ALBUMIN 3.7   CBC:  Recent Labs Lab 06/11/15 1106 06/12/15 0517 06/16/15  0445  WBC 7.4 7.6 7.8  NEUTROABS 5.1  --   --   HGB 12.9* 14.0 12.7*  HCT 37.3* 41.5 37.9*  MCV 89.2 91.4 89.4  PLT 301 271 268   CBG:  Recent Labs Lab 06/15/15 1223 06/15/15 1251 06/15/15 1655 06/15/15 2047 06/16/15 0730  GLUCAP 48* 68 112* 87 98   Sepsis Labs:  Recent Labs Lab 06/11/15 1014 06/11/15 1106 06/12/15 0517 06/16/15 0445  WBC  --  7.4 7.6 7.8  LATICACIDVEN 2.06*  --   --   --    Microbiology No results found for this or any previous visit (from the past 240 hour(s)).   Medications:   . cefTRIAXone (ROCEPHIN)  IV  1 g Intravenous Q24H  . [START ON 06/17/2015] fentaNYL  50 mcg Transdermal Q72H  . gabapentin  300 mg Oral QID  . heparin  5,000 Units Subcutaneous 3 times per day  . insulin aspart  0-15 Units Subcutaneous TID WC  . insulin aspart  0-5 Units  Subcutaneous QHS  . insulin aspart protamine- aspart  15 Units Subcutaneous Q breakfast   And  . insulin aspart protamine- aspart  10 Units Subcutaneous Q supper  . meloxicam  7.5 mg Oral Daily  . sodium chloride  3 mL Intravenous Q12H   Continuous Infusions:   Time spent: 35 minutes.  The patient is medically complex with multiple co-morbidities and is at high risk for clinical deterioration and requires high complexity decision making.    LOS: 3 days   RAMA,CHRISTINA  [facility] Pager [phone]. If unable to reach me by pager, please call my cell phone at [phone].  *Please refer to [email or web address], password [email or web address] to get updated schedule on who will round on this patient, as hospitalists switch teams weekly. If 7PM-7AM, please contact night-coverage at [email or web address], password [email or web address] for any overnight needs.  06/16/2015, 9:08 AM

## 2015-06-16 NOTE — Progress Notes (Signed)
Hypoglycemic Event  CBG: 49  Treatment: 60 ml orange juice  Symptoms: no symptoms  Follow-up CBG: Time:1220 CBG Result:94  Possible Reasons for Event:  Comments/MD notified: Dr. Darnelle Catalanama notified.    Ashwin Tibbs W Ormond Lazo

## 2015-06-16 NOTE — Care Management Important Message (Signed)
Important Message  Patient Details  Name: Jeff Ferrell MRN: [id number] Date of Birth: 02/12/1951   Medicare Important Message Given:  Yes    Haskell FlirtJamison, Creedence Heiss 06/16/2015, 12:25 PMImportant Message  Patient Details  Name: Jeff Ferrell MRN: [id number] Date of Birth: 04/21/1951   Medicare Important Message Given:  Yes    Haskell FlirtJamison, Fredonia Casalino 06/16/2015, 12:25 PM

## 2015-06-17 ENCOUNTER — Encounter (HOSPITAL_COMMUNITY)
Admission: EM | Disposition: A | Discharge status: Skilled Nursing Facility | Payer: Self-pay | Source: Home / Self Care | Attending: Family Medicine

## 2015-06-17 DIAGNOSIS — M25552 Pain in left hip: Secondary | ICD-10-CM | POA: Insufficient documentation

## 2015-06-17 DIAGNOSIS — S7292XA Unspecified fracture of left femur, initial encounter for closed fracture: Secondary | ICD-10-CM | POA: Diagnosis present

## 2015-06-17 DIAGNOSIS — S72092D Other fracture of head and neck of left femur, subsequent encounter for closed fracture with routine healing: Secondary | ICD-10-CM

## 2015-06-17 DIAGNOSIS — M4646 Discitis, unspecified, lumbar region: Secondary | ICD-10-CM | POA: Insufficient documentation

## 2015-06-17 LAB — GLUCOSE, CAPILLARY
GLUCOSE-CAPILLARY: 115 mg/dL — AB (ref 65–99)
Glucose-Capillary: 138 mg/dL — ABNORMAL HIGH (ref 65–99)
Glucose-Capillary: 147 mg/dL — ABNORMAL HIGH (ref 65–99)
Glucose-Capillary: 113 mg/dL — ABNORMAL HIGH (ref 65–99)

## 2015-06-17 SURGERY — INSERTION, INTRAMEDULLARY ROD, FEMUR, RETROGRADE
Anesthesia: General | Laterality: Left

## 2015-06-17 MED ORDER — HYDROMORPHONE HCL 1 MG/ML IJ SOLN
1.0000 mg | INTRAMUSCULAR | Status: DC | PRN
  Administered 2015-06-17 – 2015-06-23 (×9): 1 mg via INTRAVENOUS
  Filled 2015-06-17 (×10): qty 1

## 2015-06-17 MED ORDER — INSULIN ASPART 100 UNIT/ML ~~LOC~~ SOLN
0.0000 [IU] | SUBCUTANEOUS | Status: DC
  Administered 2015-06-17 – 2015-06-18 (×2): 1 [IU] via SUBCUTANEOUS
  Administered 2015-06-19 (×2): 2 [IU] via SUBCUTANEOUS

## 2015-06-17 MED ORDER — METHOCARBAMOL 750 MG PO TABS
750.0000 mg | ORAL_TABLET | Freq: Four times a day (QID) | ORAL | Status: DC | PRN
  Administered 2015-06-17 – 2015-06-18 (×2): 750 mg via ORAL
  Filled 2015-06-17 (×2): qty 1

## 2015-06-17 NOTE — Progress Notes (Signed)
Progress Note   Jeff Ferrell UJW:[id number] DOB: Oct 23, 1950 DOA: 06/11/2015 PCP: [name], MD   Brief Narrative:   Jeff Ferrell is an 64 y.o. male with PMH of multiple back surgeries including lumbar fusion of L2-S1 PLIF [facility] who [facility]/11/15 with a 2 day history of back pain unrelieved by his usual home medications. Was also thought to have a possible UTI.  Orthopedics consulted on 06/16/15 due to complaints of severe left hip pain with associated muscle spasms.  MRI confirmed left femoral neck impaction fracture.  He was transferred to [facility] in anticipation of surgical repair at the request of Dr. Yevette Edwards.  Assessment/Plan:   Principal Problem:   Left femoral neck impaction fracture with severe left hip pain - Continue pain control efforts with Oxycodone, Mobic, neurontin, dilaudid for breakthrough pain.  Also has on a Fentanyl patch. - Evaluated by orthopedics 06/17/15, with recommendations to obtain MRI. - MRI showed new left low femoral neck impaction fracture along the medial cortex without displacement. - For surgical repair.  Active problems    UTI (lower urinary tract infection) - Will stop the Rocephin. It does not appear that urine cultures were ever sent. Sent 06/16/15. F/U results.    Chronic lower back pain - Although the possibility of discitis was raised on MRI, pain appeared to be more in the left hip. - No further imaging recommended by ID for evaluation of his back pain issues.    Diabetes mellitus (HCC) - Currently being managed with moderate scale SSI and 70/30 insulin. CBGs 48-112. - No further hypoglycemic spells after adjustment of insulin.  Will D/C 70/30 and use SSI only given NPO.    DVT Prophylaxis - Lovenox ordered.   Family Communication/Anticipated D/C date and plan/Code Status   Family Communication: No family currently at the bedside. Disposition Plan: Probable  SNF. Anticipated D/C date:   2-3 days, likely will need SNF after surgery. Code Status: Full code.  IV Access:    Peripheral IV   Procedures and diagnostic studies:   Mr Lumbar Spine W Wo Contrast  06/11/2015  CLINICAL DATA:  Severe low back pain radiating to the left leg EXAM: MRI LUMBAR SPINE WITHOUT AND WITH CONTRAST TECHNIQUE: Multiplanar and multiecho pulse sequences of the lumbar spine were obtained without and with intravenous contrast. CONTRAST:  17mL MULTIHANCE GADOBENATE DIMEGLUMINE 529 MG/ML IV SOLN COMPARISON:  None. FINDINGS: Severe L5 vertebral body height loss. The vertebral bodies of the lumbar spine are normal in alignment. There is normal bone marrow signal demonstrated throughout the vertebra. Posterior lumbar fusion from L2 through S1. Severe L4-5 disc space narrowing. There is increased T2 signal within the L5-S1 disc space with mild disc enhancement. There is no paravertebral fluid collection or edema. The spinal cord is normal in signal and contour. The cord terminates normally at T12 . The nerve roots of the cauda equina and the filum terminale are normal. The visualized portions of the SI joints are unremarkable. The imaged intra-abdominal contents are unremarkable. T12-L1: No significant disc bulge. No evidence of neural foraminal stenosis. No central canal stenosis. L1-L2: Mild broad-based disc bulge. No evidence of neural foraminal stenosis. Mild bilateral facet arthropathy. Mild spinal stenosis. L2-L3: No significant disc bulge. No evidence of neural foraminal stenosis. No central canal stenosis. L3-L4: Limited evaluation secondary to susceptibility artifact. No significant disc bulge. No evidence of neural foraminal stenosis. No central canal stenosis. L4-L5: Limited evaluation secondary to susceptibility artifact. Severe  disc height loss. No evidence of neural foraminal stenosis. No central canal stenosis. L5-S1: Increased T2 signal within the L5-S1 disc space with mild  disc enhancement. Mild bilateral foraminal narrowing. No central canal stenosis. IMPRESSION: 1. Increased T2 signal within the L5-S1 disc space with mild disc enhancement. There is no adjacent marrow signal abnormality. There is no paravertebral soft tissue abnormality. This may reflect early discitis versus postsurgical changes. 2. Posterior lumbar fusion from L2 through S1. 3. Severe disc height loss at L4-5 and L5 vertebral body height loss which may be secondary to prior discitis or prior trauma. Electronically Signed   By: Elige Ko   On: 06/11/2015 12:34   Dg Toe 2nd Left  06/11/2015  CLINICAL DATA:  Fall today with second toe pain, initial encounter EXAM: LEFT SECOND TOE COMPARISON:  03/29/2015 FINDINGS: There is been interval amputation of the distal first toe with only a portion of the proximal phalanx are identified. Irregularity and increased sclerosis is noted just below the head of the second metatarsal consistent with subacute injury. No fracture is identified. No bony erosion to suggest osteomyelitis is seen. IMPRESSION: Postoperative change. Changes consistent with a subacute fracture in the distal second metatarsal. No other focal abnormality is seen. Electronically Signed   By: Alcide Clever M.D.   On: 06/11/2015 10:05   Dg Hip Unilat With Pelvis 2-3 Views Left  06/11/2015  CLINICAL DATA:  Pain following fall EXAM: DG HIP (WITH OR WITHOUT PELVIS) 2-3V LEFT COMPARISON:  November 30, 2006 FINDINGS: Frontal pelvis as well as frontal and lateral left hip images were obtained. There is slight symmetric narrowing of both hip joints. No fracture or dislocation. No erosive change. There is extensive postoperative change in the lower lumbar spine and upper sacral regions. IMPRESSION: No acute fracture or dislocation. Mild symmetric narrowing both hip joints. Extensive postoperative change in the visualized lower lumbar spine and upper sacral regions. Electronically Signed   By: Bretta Bang III  M.D.   On: 06/11/2015 09:45     Medical Consultants:    Orthopedics: Estill Bamberg, MD  Anti-Infectives:    Rocephin 06/11/15--->  Subjective:   [name] continues to have severe left hip pain and muscle spasms.  He has been unable to sleep due to the spasms which occur with movement of his leg.  No N/V.  Objective:    Filed Vitals:   06/16/15 0536 06/16/15 1411 06/16/15 2228 06/17/15 0601  BP: 113/72 116/71 113/59 135/78  Pulse: 113 114 109 111  Temp: 99 F (37.2 C) 98.4 F (36.9 C) 98.4 F (36.9 C) 98 F (36.7 C)  TempSrc: Oral Oral Oral Oral  Resp: Height:      Weight:      SpO2: 100% 94% 96% 99%    Intake/Output Summary (Last 24 hours) at 06/17/15 0837 Last data filed at 06/17/15 4098  Gross per 24 hour  Intake    930 ml  Output    700 ml  Net    230 ml   Filed Weights   06/11/15 0838  Weight: 83.462 kg (184 lb)    Exam: Gen:  Mild distress secondary to pain Cardiovascular:  Tachycardic, No M/R/G Respiratory:  Lungs CTAB Gastrointestinal:  Abdomen soft, NT/ND, + BS Extremities:  No C/E/C, knees and anterior legs with multiple scabs   Data Reviewed:    Labs: Basic Metabolic Panel:  Recent Labs Lab 06/11/15 1018 06/12/15 0517 06/16/15 0445  NA 136 134* 132*  K 4.4 4.4 4.1  CL 98* 97* 95*  CO2 27 25 27   GLUCOSE 152* 125* 97  BUN 11 13 12   CREATININE 0.87 0.72 0.68  CALCIUM 9.7 9.2 9.4   GFR Estimated Creatinine Clearance: 92.1 mL/min (by C-G formula based on Cr of 0.68). Liver Function Tests:  Recent Labs Lab 06/11/15 1018  AST 29  ALT 16*  ALKPHOS 173*  BILITOT 1.6*  PROT 7.7  ALBUMIN 3.7   CBC:  Recent Labs Lab 06/11/15 1106 06/12/15 0517 06/16/15 0445  WBC 7.4 7.6 7.8  NEUTROABS 5.1  --   --   HGB 12.9* 14.0 12.7*  HCT 37.3* 41.5 37.9*  MCV 89.2 91.4 89.4  PLT 301 271 268   CBG:  Recent Labs Lab 06/16/15 1155 06/16/15 1233 06/16/15 1637 06/16/15 2228 06/17/15 0723  GLUCAP 49* 94  111* 79 138*   Sepsis Labs:  Recent Labs Lab 06/11/15 1014 06/11/15 1106 06/12/15 0517 06/16/15 0445  WBC  --  7.4 7.6 7.8  LATICACIDVEN 2.06*  --   --   --    Microbiology Recent Results (from the past 240 hour(s))  Urine culture     Status: None (Preliminary result)   Collection Time: 06/16/15  4:39 PM  Result Value Ref Range Status   Specimen Description URINE, CLEAN CATCH  Final   Special Requests Normal  Final   Culture   Final    NO GROWTH < 24 HOURS [facility]    Report Status PENDING  Incomplete     Medications:   . cefTRIAXone (ROCEPHIN)  IV  1 g Intravenous Q24H  . enoxaparin (LOVENOX) injection  40 mg Subcutaneous Q24H  . fentaNYL  50 mcg Transdermal Q72H  . gabapentin  300 mg Oral QID  . insulin aspart protamine- aspart  10 Units Subcutaneous Q breakfast   And  . insulin aspart protamine- aspart  10 Units Subcutaneous Q supper  . meloxicam  7.5 mg Oral Daily  . sodium chloride  3 mL Intravenous Q12H   Continuous Infusions:   Time spent: 25 minutes.    LOS: 4 days   Nesha Counihan  [facility] Pager [phone]. If unable to reach me by pager, please call my cell phone at [phone].  *Please refer to [email or web address], password [email or web address] to get updated schedule on who will round on this patient, as hospitalists switch teams weekly. If 7PM-7AM, please contact night-coverage at [email or web address], password [email or web address] for any overnight needs.  06/17/2015, 8:37 AM

## 2015-06-17 NOTE — Progress Notes (Signed)
I discussed situation with patient, and he did elect to proceed with surgery. I discussed situation with my colleague, Dr. Glee ArvinMichael Xu, who does have availability to [facility] I discussed this with the medical team, and Dr. Darnelle Catalanama will [facility] tomorrow Patient may eat today, but needs to be made NPO after midnight for surgery tomorrow.

## 2015-06-17 NOTE — Progress Notes (Signed)
PT Cancellation Note  Patient Details Name: Jeff Ferrell MRN: [id number] DOB: 03/09/1951   Cancelled Treatment:    Reason Eval/Treat Not Completed: Medical issues which prohibited therapy. Orthopedics consulted and pt has new L femoral neck fx. Awaiting pt's decision regarding surgery. Will hold PT at this time and await recommendations from orthopedics.   Rebeca AlertJannie Porsche Noguchi, MPT Pager: [phone]

## 2015-06-17 NOTE — Progress Notes (Signed)
Report called to [place] at [facility] at [place].  [facility] transported patient, after patient had received 1 mg IV Dilaudid.

## 2015-06-17 NOTE — Progress Notes (Signed)
Nondisplaced L femoral neck/IT fracture noted on MRI. This will need to be fixed surgically. Surgery as well as risks such as infection, nerve injury, vascular injury, nonunion and malunion have been discussed with patient. Patient would like to think this over a bit and talk with family, which I understand. Patient has been made NPO. I will reach out to patient later today.

## 2015-06-17 NOTE — Progress Notes (Signed)
[place]. [place] SNF search was initiated. [facility] prior to bed offers being made. CS[facility] will [facility].    Etta QuillSarah Gonzalez-Graham, LCSW [phone] Hospital psychiatric & 5E, 5W 31-35 Licensed Clinical Social Worker

## 2015-06-17 NOTE — Consult Note (Signed)
Reason for Consult: Left hip pain Referring Physician: Dr. Nelva Bush  HPI: Jeff Ferrell is an 64 y.o. Male whom I was consulted on for sever pain in the left hip region. He states that the pain began 2 weeks ago, and that he has been unable to walk. Pain is at lateral hip, just above the GT. Left hip is most comfortable in a flexed position. Pain is constant. Patient denies any trauma. Pain also radiates into left abdominal region. Any ROM of L hip causes severe spasms going down his left. Patient also reports numbess in the left foot. Patient denies any fevers.   Past Medical History  Diagnosis Date  . Diabetes mellitus     regulated by diet  . Chronic back pain   . Toe osteomyelitis, left ([facility]) 03/31/2015  . Diabetic osteomyelitis ([place]) 03/29/2015  . Diabetic foot ulcer ([place]) 03/29/2015    Past Surgical History  Procedure Laterality Date  . Back surgery      screws & hardware to lower back  . Amputation toe Left 03/30/2015    Procedure: AMPUTATION LEFT GREAT TOE;  Surgeon: Melrose Nakayama, MD;  Location: [facility];  Service: Orthopedics;  Laterality: Left;    History reviewed. No pertinent family history.  Social History:  reports that he has never smoked. He does not have any smokeless tobacco history on file. He reports that he drinks about 3.6 oz of alcohol per week. He reports that he does not use illicit drugs.  Allergies:  Allergies  Allergen Reactions  . Penicillins Hives and Rash    Has patient had a PCN reaction causing immediate rash, facial/tongue/throat swelling, SOB or lightheadedness with hypotension: Yes Has patient had a PCN reaction causing severe rash involving mucus membranes or skin necrosis: Yes   Has patient had a PCN reaction that required hospitalization No Has patient had a PCN reaction occurring within the last 10 years: No If all of the above answers are "NO", then may proceed with Cephalosporin use.     Medications: I have reviewed the patient's  current medications.  Results for orders placed or performed during the hospital encounter of 06/11/15 (from the past 48 hour(s))  Glucose, capillary     Status: Abnormal   Collection Time: 06/15/15  7:24 AM  Result Value Ref Range   Glucose-Capillary 142 (H) 65 - 99 mg/dL   Comment 1 Notify RN   Glucose, capillary     Status: Abnormal   Collection Time: 06/15/15 12:23 PM  Result Value Ref Range   Glucose-Capillary 48 (L) 65 - 99 mg/dL  Glucose, capillary     Status: None   Collection Time: 06/15/15 12:51 PM  Result Value Ref Range   Glucose-Capillary 68 65 - 99 mg/dL  Glucose, capillary     Status: Abnormal   Collection Time: 06/15/15  4:55 PM  Result Value Ref Range   Glucose-Capillary 112 (H) 65 - 99 mg/dL  Glucose, capillary     Status: None   Collection Time: 06/15/15  8:47 PM  Result Value Ref Range   Glucose-Capillary 87 65 - 99 mg/dL  CBC     Status: Abnormal   Collection Time: 06/16/15  4:45 AM  Result Value Ref Range   WBC 7.8 4.0 - 10.5 K/uL   RBC 4.24 4.22 - 5.81 MIL/uL   Hemoglobin 12.7 (L) 13.0 - 17.0 g/dL   HCT 37.9 (L) 39.0 - 52.0 %   MCV 89.4 78.0 - 100.0 fL   MCH 30.0 26.0 -  34.0 pg   MCHC 33.5 30.0 - 36.0 g/dL   RDW 12.2 11.5 - 15.5 %   Platelets 268 150 - 400 K/uL  Basic metabolic panel     Status: Abnormal   Collection Time: 06/16/15  4:45 AM  Result Value Ref Range   Sodium 132 (L) 135 - 145 mmol/L   Potassium 4.1 3.5 - 5.1 mmol/L   Chloride 95 (L) 101 - 111 mmol/L   CO2 27 22 - 32 mmol/L   Glucose, Bld 97 65 - 99 mg/dL   BUN 12 6 - 20 mg/dL   Creatinine, Ser 0.68 0.61 - 1.24 mg/dL   Calcium 9.4 8.9 - 10.3 mg/dL   GFR calc non Af Amer >60 >60 mL/min   GFR calc Af Amer >60 >60 mL/min    Comment: (NOTE) The eGFR has been calculated using the CKD EPI equation. This calculation has not been validated in all clinical situations. eGFR's persistently <60 mL/min signify possible Chronic Kidney Disease.    Anion gap 10 5 - 15  Glucose, capillary      Status: None   Collection Time: 06/16/15  7:30 AM  Result Value Ref Range   Glucose-Capillary 98 65 - 99 mg/dL   Comment 1 Notify RN   Glucose, capillary     Status: Abnormal   Collection Time: 06/16/15 11:55 AM  Result Value Ref Range   Glucose-Capillary 49 (L) 65 - 99 mg/dL   Comment 1 Notify RN   Glucose, capillary     Status: None   Collection Time: 06/16/15 12:33 PM  Result Value Ref Range   Glucose-Capillary 94 65 - 99 mg/dL   Comment 1 Notify RN   Glucose, capillary     Status: Abnormal   Collection Time: 06/16/15  4:37 PM  Result Value Ref Range   Glucose-Capillary 111 (H) 65 - 99 mg/dL   Comment 1 Notify RN   Glucose, capillary     Status: None   Collection Time: 06/16/15 10:28 PM  Result Value Ref Range   Glucose-Capillary 79 65 - 99 mg/dL    No results found.  Review of Systems  Constitutional: Negative for fever and chills.  Eyes: Negative.   Respiratory: Negative.   Cardiovascular: Negative.   Musculoskeletal: Positive for back pain and joint pain.  Skin: Negative.   Neurological: Negative for headaches.   Blood pressure 135/78, pulse 111, temperature 98 F (36.7 C), temperature source Oral, resp. rate 18, height 5' 9" (1.753 m), weight 184 lb (83.462 kg), SpO2 99 %. Physical Exam  Constitutional: He is oriented to person, place, and time. He appears well-developed.  HENT:  Head: Normocephalic.  Eyes: Pupils are equal, round, and reactive to light.  Neck: Normal range of motion.  Respiratory: Effort normal.  GI: There is tenderness.  Musculoskeletal:  + pain to palpation just above left GT in region of the gluteus medius and minimus  Sever discomfort noted with any ROM of L hip  Neurological: He is oriented to person, place, and time.  Skin: Skin is warm and dry.  Psychiatric: He has a normal mood and affect.    Left hip Xrays are normal for any acute findings.  Lumbar MRI - for nerve compression  Assessment/Plan: Patient clearly has severe L  hip pain radiating to the L abdomen and down the leg leg, limiting his ability to ambulate. Etiology is unclear. Infection, fracture or tumor does need to be ruled out.   I am recommending an MRI of  the patient's pelvis. Once reviewed I will offer additional recommendations...  Marc Leichter LEONARD 06/17/2015, 6:36 AM

## 2015-06-18 ENCOUNTER — Encounter (HOSPITAL_COMMUNITY): Payer: Self-pay | Admitting: Certified Registered Nurse Anesthetist

## 2015-06-18 ENCOUNTER — Encounter (HOSPITAL_COMMUNITY)
Admission: EM | Disposition: A | Discharge status: Skilled Nursing Facility | Payer: Self-pay | Source: Home / Self Care | Attending: Family Medicine

## 2015-06-18 ENCOUNTER — Inpatient Hospital Stay (HOSPITAL_COMMUNITY): Payer: Medicare Other | Admitting: Certified Registered Nurse Anesthetist

## 2015-06-18 ENCOUNTER — Inpatient Hospital Stay (HOSPITAL_COMMUNITY): Payer: Medicare Other

## 2015-06-18 HISTORY — PX: FEMUR IM NAIL: SHX1597

## 2015-06-18 LAB — URINE CULTURE: SPECIAL REQUESTS: NORMAL

## 2015-06-18 LAB — GLUCOSE, CAPILLARY
GLUCOSE-CAPILLARY: 102 mg/dL — AB (ref 65–99)
GLUCOSE-CAPILLARY: 130 mg/dL — AB (ref 65–99)
GLUCOSE-CAPILLARY: 135 mg/dL — AB (ref 65–99)
GLUCOSE-CAPILLARY: 95 mg/dL (ref 65–99)
GLUCOSE-CAPILLARY: 97 mg/dL (ref 65–99)
GLUCOSE-CAPILLARY: 97 mg/dL (ref 65–99)
Glucose-Capillary: 121 mg/dL — ABNORMAL HIGH (ref 65–99)

## 2015-06-18 LAB — CBC
HEMATOCRIT: 33.2 % — AB (ref 39.0–52.0)
HEMOGLOBIN: 11.3 g/dL — AB (ref 13.0–17.0)
MCH: 30 pg (ref 26.0–34.0)
MCHC: 34 g/dL (ref 30.0–36.0)
MCV: 88.1 fL (ref 78.0–100.0)
Platelets: 277 10*3/uL (ref 150–400)
RBC: 3.77 MIL/uL — ABNORMAL LOW (ref 4.22–5.81)
RDW: 12.1 % (ref 11.5–15.5)
WBC: 7.1 10*3/uL (ref 4.0–10.5)

## 2015-06-18 LAB — SURGICAL PCR SCREEN
MRSA, PCR: POSITIVE — AB
STAPHYLOCOCCUS AUREUS: POSITIVE — AB

## 2015-06-18 LAB — CREATININE, SERUM: Creatinine, Ser: 0.74 mg/dL (ref 0.61–1.24)

## 2015-06-18 SURGERY — INSERTION, INTRAMEDULLARY ROD, FEMUR
Anesthesia: General | Laterality: Left

## 2015-06-18 MED ORDER — PROPOFOL 10 MG/ML IV BOLUS
INTRAVENOUS | Status: DC | PRN
  Administered 2015-06-18: 150 mg via INTRAVENOUS

## 2015-06-18 MED ORDER — SODIUM CHLORIDE 0.9 % IV SOLN
INTRAVENOUS | Status: DC
  Administered 2015-06-18: 17:00:00 via INTRAVENOUS
  Administered 2015-06-19: 125 mL/h via INTRAVENOUS
  Administered 2015-06-20: 08:00:00 via INTRAVENOUS

## 2015-06-18 MED ORDER — MUPIROCIN 2 % EX OINT
1.0000 | TOPICAL_OINTMENT | Freq: Two times a day (BID) | CUTANEOUS | Status: AC
Start: 2015-06-18 — End: 2015-06-22
  Administered 2015-06-18 – 2015-06-22 (×11): 1 via NASAL
  Filled 2015-06-18: qty 22

## 2015-06-18 MED ORDER — METOCLOPRAMIDE HCL 5 MG/ML IJ SOLN: 5.0000 mg | Freq: Three times a day (TID) | INTRAMUSCULAR | Status: DC | PRN

## 2015-06-18 MED ORDER — GLYCOPYRROLATE 0.2 MG/ML IJ SOLN
INTRAMUSCULAR | Status: DC | PRN
  Administered 2015-06-18: 0.6 mg via INTRAVENOUS

## 2015-06-18 MED ORDER — VANCOMYCIN HCL IN DEXTROSE 1-5 GM/200ML-% IV SOLN
1000.0000 mg | INTRAVENOUS | Status: AC
  Administered 2015-06-18: 1000 mg via INTRAVENOUS
  Filled 2015-06-18 (×3): qty 200

## 2015-06-18 MED ORDER — ROCURONIUM BROMIDE 100 MG/10ML IV SOLN
INTRAVENOUS | Status: DC | PRN
  Administered 2015-06-18: 10 mg via INTRAVENOUS
  Administered 2015-06-18: 40 mg via INTRAVENOUS

## 2015-06-18 MED ORDER — HYDROMORPHONE HCL 1 MG/ML IJ SOLN
0.2500 mg | INTRAMUSCULAR | Status: DC | PRN
  Administered 2015-06-18 (×2): 0.5 mg via INTRAVENOUS

## 2015-06-18 MED ORDER — OXYCODONE HCL 5 MG/5ML PO SOLN: 5.0000 mg | Freq: Once | ORAL | Status: DC | PRN

## 2015-06-18 MED ORDER — ACETAMINOPHEN 650 MG RE SUPP: 650.0000 mg | Freq: Four times a day (QID) | RECTAL | Status: DC | PRN

## 2015-06-18 MED ORDER — ENOXAPARIN SODIUM 40 MG/0.4ML ~~LOC~~ SOLN
40.0000 mg | SUBCUTANEOUS | Status: DC
  Administered 2015-06-19 – 2015-06-23 (×5): 40 mg via SUBCUTANEOUS
  Filled 2015-06-18 (×5): qty 0.4

## 2015-06-18 MED ORDER — PROMETHAZINE HCL 25 MG/ML IJ SOLN: 6.2500 mg | INTRAMUSCULAR | Status: DC | PRN

## 2015-06-18 MED ORDER — FENTANYL CITRATE (PF) 100 MCG/2ML IJ SOLN
INTRAMUSCULAR | Status: DC | PRN
  Administered 2015-06-18 (×3): 50 ug via INTRAVENOUS

## 2015-06-18 MED ORDER — ONDANSETRON HCL 4 MG PO TABS: 4.0000 mg | ORAL_TABLET | Freq: Four times a day (QID) | ORAL | Status: DC | PRN

## 2015-06-18 MED ORDER — MIDAZOLAM HCL 5 MG/5ML IJ SOLN
INTRAMUSCULAR | Status: DC | PRN
  Administered 2015-06-18: 2 mg via INTRAVENOUS

## 2015-06-18 MED ORDER — OXYCODONE HCL 5 MG PO TABS
ORAL_TABLET | ORAL | Status: AC
  Filled 2015-06-18: qty 2

## 2015-06-18 MED ORDER — VANCOMYCIN HCL IN DEXTROSE 1-5 GM/200ML-% IV SOLN
1000.0000 mg | Freq: Two times a day (BID) | INTRAVENOUS | Status: AC
  Administered 2015-06-19: 1000 mg via INTRAVENOUS
  Filled 2015-06-18: qty 200

## 2015-06-18 MED ORDER — OXYCODONE HCL 5 MG PO TABS: 5.0000 mg | ORAL_TABLET | Freq: Once | ORAL | Status: DC | PRN

## 2015-06-18 MED ORDER — METOCLOPRAMIDE HCL 5 MG PO TABS: 5.0000 mg | ORAL_TABLET | Freq: Three times a day (TID) | ORAL | Status: DC | PRN

## 2015-06-18 MED ORDER — HYDROCODONE-ACETAMINOPHEN 5-325 MG PO TABS
1.0000 | ORAL_TABLET | Freq: Four times a day (QID) | ORAL | Status: DC | PRN
  Administered 2015-06-18 – 2015-06-20 (×3): 2 via ORAL
  Filled 2015-06-18 (×3): qty 2

## 2015-06-18 MED ORDER — LACTATED RINGERS IV SOLN
INTRAVENOUS | Status: DC | PRN
  Administered 2015-06-18: 12:00:00 via INTRAVENOUS

## 2015-06-18 MED ORDER — FENTANYL CITRATE (PF) 250 MCG/5ML IJ SOLN
INTRAMUSCULAR | Status: AC
  Filled 2015-06-18: qty 5

## 2015-06-18 MED ORDER — HYDROMORPHONE HCL 1 MG/ML IJ SOLN
INTRAMUSCULAR | Status: AC
  Administered 2015-06-18: 1 mg
  Filled 2015-06-18: qty 1

## 2015-06-18 MED ORDER — LACTATED RINGERS IV SOLN
INTRAVENOUS | Status: DC
  Administered 2015-06-18: 11:00:00 via INTRAVENOUS

## 2015-06-18 MED ORDER — CHLORHEXIDINE GLUCONATE CLOTH 2 % EX PADS
6.0000 | MEDICATED_PAD | Freq: Every day | CUTANEOUS | Status: AC
Start: 2015-06-18 — End: 2015-06-23
  Administered 2015-06-21 – 2015-06-22 (×2): 6 via TOPICAL

## 2015-06-18 MED ORDER — ONDANSETRON HCL 4 MG/2ML IJ SOLN: 4.0000 mg | Freq: Four times a day (QID) | INTRAMUSCULAR | Status: DC | PRN

## 2015-06-18 MED ORDER — OXYCODONE HCL 5 MG PO TABS
5.0000 mg | ORAL_TABLET | ORAL | Status: DC | PRN
  Administered 2015-06-18 – 2015-06-23 (×19): 10 mg via ORAL
  Filled 2015-06-18 (×18): qty 2

## 2015-06-18 MED ORDER — NEOSTIGMINE METHYLSULFATE 10 MG/10ML IV SOLN
INTRAVENOUS | Status: DC | PRN
  Administered 2015-06-18: 4 mg via INTRAVENOUS

## 2015-06-18 MED ORDER — EPHEDRINE SULFATE 50 MG/ML IJ SOLN
INTRAMUSCULAR | Status: DC | PRN
  Administered 2015-06-18: 5 mg via INTRAVENOUS

## 2015-06-18 MED ORDER — ROCURONIUM BROMIDE 50 MG/5ML IV SOLN
INTRAVENOUS | Status: AC
  Filled 2015-06-18: qty 1

## 2015-06-18 MED ORDER — PROPOFOL 10 MG/ML IV BOLUS
INTRAVENOUS | Status: AC
  Filled 2015-06-18: qty 40

## 2015-06-18 MED ORDER — ONDANSETRON HCL 4 MG/2ML IJ SOLN
INTRAMUSCULAR | Status: DC | PRN
  Administered 2015-06-18: 4 mg via INTRAVENOUS

## 2015-06-18 MED ORDER — METHOCARBAMOL 500 MG PO TABS
500.0000 mg | ORAL_TABLET | Freq: Four times a day (QID) | ORAL | Status: DC | PRN
  Administered 2015-06-19 – 2015-06-23 (×8): 500 mg via ORAL
  Filled 2015-06-18 (×9): qty 1

## 2015-06-18 MED ORDER — ENOXAPARIN SODIUM 40 MG/0.4ML ~~LOC~~ SOLN: 40.0000 mg | Freq: Every day | SUBCUTANEOUS | Status: DC

## 2015-06-18 MED ORDER — 0.9 % SODIUM CHLORIDE (POUR BTL) OPTIME
TOPICAL | Status: DC | PRN
  Administered 2015-06-18: 1000 mL

## 2015-06-18 MED ORDER — METHOCARBAMOL 1000 MG/10ML IJ SOLN: 500.0000 mg | Freq: Four times a day (QID) | INTRAVENOUS | Status: DC | PRN

## 2015-06-18 MED ORDER — LIDOCAINE HCL (CARDIAC) 20 MG/ML IV SOLN
INTRAVENOUS | Status: DC | PRN
  Administered 2015-06-18: 60 mg via INTRAVENOUS

## 2015-06-18 MED ORDER — MORPHINE SULFATE (PF) 2 MG/ML IV SOLN: 0.5000 mg | INTRAVENOUS | Status: DC | PRN

## 2015-06-18 MED ORDER — ACETAMINOPHEN 325 MG PO TABS
650.0000 mg | ORAL_TABLET | Freq: Four times a day (QID) | ORAL | Status: DC | PRN
Start: 2015-06-18 — End: 2015-06-23
  Administered 2015-06-20 – 2015-06-22 (×2): 650 mg via ORAL
  Filled 2015-06-18 (×2): qty 2

## 2015-06-18 MED ORDER — OXYCODONE HCL 5 MG PO TABS: 5.0000 mg | ORAL_TABLET | ORAL | Status: DC | PRN

## 2015-06-18 MED ORDER — PHENOL 1.4 % MT LIQD: 1.0000 | OROMUCOSAL | Status: DC | PRN

## 2015-06-18 MED ORDER — ALUM & MAG HYDROXIDE-SIMETH 200-200-20 MG/5ML PO SUSP
30.0000 mL | ORAL | Status: DC | PRN
  Filled 2015-06-18: qty 30

## 2015-06-18 MED ORDER — MIDAZOLAM HCL 2 MG/2ML IJ SOLN
INTRAMUSCULAR | Status: AC
  Filled 2015-06-18: qty 2

## 2015-06-18 MED ORDER — PHENYLEPHRINE HCL 10 MG/ML IJ SOLN
10.0000 mg | INTRAMUSCULAR | Status: DC | PRN
  Administered 2015-06-18: 20 ug/min via INTRAVENOUS

## 2015-06-18 MED ORDER — MENTHOL 3 MG MT LOZG: 1.0000 | LOZENGE | OROMUCOSAL | Status: DC | PRN

## 2015-06-18 MED ORDER — PHENYLEPHRINE HCL 10 MG/ML IJ SOLN
INTRAMUSCULAR | Status: DC | PRN
  Administered 2015-06-18 (×3): 80 ug via INTRAVENOUS

## 2015-06-18 SURGICAL SUPPLY — 41 items
BNDG COHESIVE 4X5 TAN STRL (GAUZE/BANDAGES/DRESSINGS) ×3 IMPLANT
COVER PERINEAL POST (MISCELLANEOUS) ×3 IMPLANT
COVER SURGICAL LIGHT HANDLE (MISCELLANEOUS) ×3 IMPLANT
DRAPE C-ARM 42X72 X-RAY (DRAPES) IMPLANT
DRAPE C-ARMOR (DRAPES) IMPLANT
DRAPE INCISE IOBAN 66X45 STRL (DRAPES) IMPLANT
DRAPE ORTHO SPLIT 77X108 STRL (DRAPES)
DRAPE PROXIMA HALF (DRAPES) IMPLANT
DRAPE SURG ORHT 6 SPLT 77X108 (DRAPES) IMPLANT
DRAPE U-SHAPE 47X51 STRL (DRAPES) IMPLANT
DRSG EMULSION OIL 3X3 NADH (GAUZE/BANDAGES/DRESSINGS) IMPLANT
DRSG MEPILEX BORDER 4X4 (GAUZE/BANDAGES/DRESSINGS) ×4 IMPLANT
DRSG MEPILEX BORDER 4X8 (GAUZE/BANDAGES/DRESSINGS) ×3 IMPLANT
DURAPREP 26ML APPLICATOR (WOUND CARE) ×3 IMPLANT
ELECT CAUTERY BLADE 6.4 (BLADE) ×3 IMPLANT
ELECT REM PT RETURN 9FT ADLT (ELECTROSURGICAL) ×3
ELECTRODE REM PT RTRN 9FT ADLT (ELECTROSURGICAL) ×1 IMPLANT
FACESHIELD WRAPAROUND (MASK) ×6 IMPLANT
FACESHIELD WRAPAROUND OR TEAM (MASK) ×2 IMPLANT
GLOVE NEODERM STRL 7.5 LF PF (GLOVE) ×2 IMPLANT
GLOVE SURG NEODERM 7.5  LF PF (GLOVE) ×4
GOWN STRL REIN XL XLG (GOWN DISPOSABLE) ×12 IMPLANT
GUIDE PIN 3.2X343 (PIN) ×1
GUIDE PIN 3.2X343MM (PIN) ×3
KIT BASIN OR (CUSTOM PROCEDURE TRAY) ×3 IMPLANT
KIT ROOM TURNOVER OR (KITS) ×3 IMPLANT
LINER BOOT UNIVERSAL DISP (MISCELLANEOUS) ×3 IMPLANT
MANIFOLD NEPTUNE II (INSTRUMENTS) ×3 IMPLANT
NAIL TRIGEN LEFT 10X38-125 (Nail) ×2 IMPLANT
NS IRRIG 1000ML POUR BTL (IV SOLUTION) ×3 IMPLANT
PACK GENERAL/GYN (CUSTOM PROCEDURE TRAY) ×3 IMPLANT
PAD ARMBOARD 7.5X6 YLW CONV (MISCELLANEOUS) ×6 IMPLANT
PIN GUIDE 3.2X343MM (PIN) IMPLANT
SCREW LAG COMPR KIT 100/95 (Screw) ×2 IMPLANT
STAPLER VISISTAT 35W (STAPLE) IMPLANT
STOCKINETTE IMPERVIOUS 9X36 MD (GAUZE/BANDAGES/DRESSINGS) IMPLANT
SUT VIC AB 0 CT1 27 (SUTURE) ×3
SUT VIC AB 0 CT1 27XBRD ANBCTR (SUTURE) ×1 IMPLANT
SUT VIC AB 2-0 CT1 27 (SUTURE) ×6
SUT VIC AB 2-0 CT1 TAPERPNT 27 (SUTURE) ×2 IMPLANT
WATER STERILE IRR 1000ML POUR (IV SOLUTION) ×6 IMPLANT

## 2015-06-18 NOTE — Transfer of Care (Signed)
Immediate Anesthesia Transfer of Care Note  Patient: Jeff Ferrell  Procedure(s) Performed: Procedure(s): INTRAMEDULLARY (IM) NAIL FEMORAL (Left)  Patient Location: PACU  Anesthesia Type:General  Level of Consciousness: awake, patient cooperative and responds to stimulation  Airway & Oxygen Therapy: Patient Spontanous Breathing and Patient connected to nasal cannula oxygen  Post-op Assessment: Report given to RN, Post -op Vital signs reviewed and stable and Patient moving all extremities X 4  Post vital signs: Reviewed and stable  Last Vitals:  Filed Vitals:   06/18/15 0442 06/18/15 1256  BP: 119/63 115/85  Pulse: 109 105  Temp: 37.3 C 36.5 C  Resp: 16 15    Complications: No apparent anesthesia complications

## 2015-06-18 NOTE — Consult Note (Signed)
ORTHOPAEDIC CONSULTATION  REQUESTING PHYSICIAN: Edsel Petrin, DO  Chief Complaint: Left hip fracture  HPI: Jeff Ferrell is a 64 y.o. male who presents with left hip fracture s/p mechanical fall.  The patient endorses severe pain in the left hip, that does not radiate, grinding in quality, worse with any movement, better with immobilization.  Denies LOC/fever/chills/nausea/vomiting.  Walks without assistive devices (walker, cane, wheelchair).  Does live independently.  Past Medical History  Diagnosis Date  . Diabetes mellitus     regulated by diet  . Chronic back pain   . 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 Diabetic osteomyelitis ([facility]) 03/29/2015  . Diabetic foot ulcer ([facility]) 03/29/2015   Past Surgical History  Procedure Laterality Date  . Back surgery      screws & hardware to lower back  . Amputation toe Left 03/30/2015    Procedure: AMPUTATION LEFT GREAT TOE;  Surgeon: Marcene Corning, MD;  Location: [facility];  Service: Orthopedics;  Laterality: Left;   Social History   Social History  . Marital Status: Single    Spouse Name: N/A  . Number of Children: N/A  . Years of Education: N/A   Social History Main Topics  . Smoking status: Never Smoker   . Smokeless tobacco: None  . Alcohol Use: 3.6 oz/week    6 Cans of beer per week     Comment: daily  . Drug Use: No  . Sexual Activity: Not Asked   Other Topics Concern  . None   Social History Narrative   History reviewed. No pertinent family history. Allergies  Allergen Reactions  . Penicillins Hives and Rash    Has patient had a PCN reaction causing immediate rash, facial/tongue/throat swelling, SOB or lightheadedness with hypotension: Yes Has patient had a PCN reaction causing severe rash involving mucus membranes or skin necrosis: Yes   Has patient had a PCN reaction that required hospitalization No Has patient had a PCN reaction occurring within the last 10 years: No If all of the above answers are  "NO", then may proceed with Cephalosporin use.    Prior to Admission medications   Medication Sig Start Date End Date Taking? Authorizing Provider  gabapentin (NEURONTIN) 300 MG capsule Take 300 mg by mouth 4 (four) times daily.   Yes Historical Provider, MD  glipiZIDE (GLUCOTROL) 10 MG tablet Take 10 mg by mouth daily.   Yes Historical Provider, MD  insulin aspart protamine - aspart (NOVOLOG 70/30 MIX) (70-30) 100 UNIT/ML FlexPen Inject 0.22 mLs (22 Units total) into the skin daily with supper. Patient taking differently: Inject 10-22 Units into the skin 2 (two) times daily. 22 units in the morning and 10 units with supper 04/01/15  Yes Marinda Elk, MD  ciprofloxacin (CIPRO) 250 MG tablet Take 1 tablet (250 mg total) by mouth 2 (two) times daily. Patient not taking: Reported on 06/11/2015 04/01/15   Marinda Elk, MD  doxycycline (VIBRA-TABS) 100 MG tablet Take 1 tablet (100 mg total) by mouth 2 (two) times daily. Patient not taking: Reported on 06/11/2015 04/01/15   Marinda Elk, MD  insulin aspart protamine- aspart (NOVOLOG MIX 70/30) (70-30) 100 UNIT/ML injection Inject 0.4 mLs (40 Units total) into the skin daily with breakfast. Takes 40 units with breakfast Patient not taking: Reported on 06/11/2015 04/01/15   Marinda Elk, MD   Mr Hip Left W Wo Contrast  06/17/2015  CLINICAL DATA:  Severe left hip pain since a recent fall. EXAM: MRI OF THE LEFT  HIP WITHOUT AND WITH CONTRAST TECHNIQUE: Multiplanar, multisequence MR imaging was performed both before and after administration of intravenous contrast. CONTRAST:  17mL MULTIHANCE GADOBENATE DIMEGLUMINE 529 MG/ML IV SOLN COMPARISON:  Radiographs dated 06/11/2015 FINDINGS: Bones: There is a new left low femoral neck fracture with impaction along the medial aspect with a nondisplaced hairline fracture extending superiorly into the greater trochanter. There is edema in the surrounding musculature, particularly the distal  left iliopsoas muscle and in the proximal left vastus intermedius muscle as well some edema in the left adductor muscles. There is a small fluid collection adjacent to the left lesser trochanter. There is surprisingly little edema in the femur at the site of the fracture. There is only slight enhancement at the fracture site after contrast administration. No other acute abnormality of the bones of the pelvis or hips. Previous lumbosacral fusion. The bladder is markedly distended to at least the level of the umbilicus. IMPRESSION: 1. New left low femoral neck impaction fracture along the medial cortex without displacement. This was not visible on the prior radiographs. 2. Edema in the adjacent musculature as described. 3. Marked distention of the bladder. Electronically Signed   By: Francene BoyersJames  Maxwell M.D.   On: 06/17/2015 07:43   All pertinent xrays, MRI, CT independently reviewed and interpreted  Positive ROS: All other systems have been reviewed and were otherwise negative with the exception of those mentioned in the HPI and as above.  Physical Exam: General: Alert, no acute distress Cardiovascular: No pedal edema Respiratory: No cyanosis, no use of accessory musculature GI: No organomegaly, abdomen is soft and non-tender Skin: No lesions in the area of chief complaint Neurologic: Sensation intact distally Psychiatric: Patient is competent for consent with normal mood and affect Lymphatic: No axillary or cervical lymphadenopathy  MUSCULOSKELETAL:  - severe pain with movement of the hip and extremity - skin intact - NVI distally - compartments soft  Assessment: Left intertroch/basicervical hip fracture  Plan: - surgery is recommended, patient and family are aware of r/b/a and wish to proceed - consent obtained - medical optimization per primary team - surgery is planned for today  Thank you for the consult and the opportunity to see Jeff Ferrell  N. Glee ArvinMichael Pansie Guggisberg, MD [facility] [phone] 7:27 AM

## 2015-06-18 NOTE — Discharge Instructions (Signed)
° ° °  1. Change dressings as needed °2. May shower but keep incisions covered and dry °3. Take lovenox to prevent blood clots °4. Take stool softeners as needed °5. Take pain meds as needed ° °

## 2015-06-18 NOTE — Anesthesia Procedure Notes (Addendum)
Procedure Name: Intubation Date/Time: 06/18/2015 11:45 AM Performed by: Virgel GessHOLTZMAN, Ladena Jacquez LEFFEW Pre-anesthesia Checklist: Patient identified, Patient being monitored, Timeout performed, Emergency Drugs available and Suction available Patient Re-evaluated:Patient Re-evaluated prior to inductionOxygen Delivery Method: Circle System Utilized Preoxygenation: Pre-oxygenation with 100% oxygen Intubation Type: IV induction Ventilation: Mask ventilation without difficulty Laryngoscope Size: Mac and 3 Grade View: Grade II Tube type: Oral Tube size: 7.5 mm Number of attempts: 1 Airway Equipment and Method: Stylet Placement Confirmation: ETT inserted through vocal cords under direct vision,  positive ETCO2 and breath sounds checked- equal and bilateral Secured at: 23 cm Tube secured with: Tape Dental Injury: Teeth and Oropharynx as per pre-operative assessment

## 2015-06-18 NOTE — Anesthesia Preprocedure Evaluation (Addendum)
Anesthesia Evaluation  Patient identified by MRN, date of birth, ID band Patient awake    Reviewed: Allergy & Precautions, NPO status , Patient's Chart, lab work & pertinent test results  Airway Mallampati: II  TM Distance: >3 FB Neck ROM: Full    Dental  (+) Missing, Loose, Dental Advisory Given   Pulmonary neg pulmonary ROS,    breath sounds clear to auscultation       Cardiovascular negative cardio ROS   Rhythm:Regular Rate:Normal     Neuro/Psych negative neurological ROS     GI/Hepatic negative GI ROS, Neg liver ROS,   Endo/Other  diabetes, Type 2, Oral Hypoglycemic Agents  Renal/GU negative Renal ROS     Musculoskeletal   Abdominal   Peds  Hematology negative hematology ROS (+)   Anesthesia Other Findings   Reproductive/Obstetrics                            Lab Results  Component Value Date   WBC 7.8 06/16/2015   HGB 12.7* 06/16/2015   HCT 37.9* 06/16/2015   MCV 89.4 06/16/2015   PLT 268 06/16/2015   Lab Results  Component Value Date   CREATININE 0.68 06/16/2015   BUN 12 06/16/2015   NA 132* 06/16/2015   K 4.1 06/16/2015   CL 95* 06/16/2015   CO2 27 06/16/2015    Anesthesia Physical Anesthesia Plan  ASA: II  Anesthesia Plan: General   Post-op Pain Management:    Induction: Intravenous  Airway Management Planned: Oral ETT  Additional Equipment:   Intra-op Plan:   Post-operative Plan: Extubation in OR  Informed Consent: I have reviewed the patients History and Physical, chart, labs and discussed the procedure including the risks, benefits and alternatives for the proposed anesthesia with the patient or authorized representative who has indicated his/her understanding and acceptance.   Dental advisory given  Plan Discussed with: CRNA  Anesthesia Plan Comments:         Anesthesia Quick Evaluation

## 2015-06-18 NOTE — Progress Notes (Signed)
[facility]                                                                              Patient Demographics  Jeff Ferrell, is a 64 y.o. male, DOB - 1951-01-04, RUE:[id number]  Admit date - 06/11/2015   Admitting Physician Penny Pia, MD  Outpatient Primary MD for the patient is [name], MD  LOS - 5   Chief Complaint  Patient presents with  . Hip Pain  . Fall      HPI on 06/11/2015 by Dr. Penny Pia Jeff Ferrell is a 64 y.o. male Patient has a history of extensive back surgery in 2011 with lumbar fusion of L2-S1 PLIF with hardware multilevel laminectomies at the [place] in [place]. He is presenting with 2 day complaint of back discomfort. He has had back pain for years but states that within last 2 days his pain medication regimen at home has not been helping him. Nothing he is aware of makes it better. Activity makes it worse. He denies any fevers. Given persistence in symptoms patient presented to the hospital for further evaluation recommendations.  In the ED patient was evaluated by ID physician who did not recommend any further workup for patient's back. Given intractable back pain and urinary tract infection we were consulted for further evaluation recommendations.  Interim history: Patient was transferred to [facility] for surgical repair by orthopedics.  Assessment & Plan   Left femoral neck and patch and fracture with severe left hip pain/spasms -Continue pain control, oxycodone, mobile, Neurontin, Dilaudid, fentanyl patch -Orthopedic surgery consulted appreciated -MRI showed new left lower femoral neck impaction fracture along the medial cortex without displacement -Surgical repair scheduled for today  Urinary tract infection -Patient on Rocephin however held as urine cultures were not sent -UA on 06/11/2015 showed WBC 6-30, many bacteria, small leukocytes, positive nitrites -Urine cultures pulse 29 2016 showed multiple species -Patient currently afebrile  with no leukocytosis, will hold off on antibiotics.  Chronic lower back pain/spasms -Patient has had a history of prior back surgeries -? Discitis noted on MRI -No further imaging recommended by ID (as noted on previous hospitalist note)  Diabetes mellitus, type II -Continue insulin sliding scale CBG monitoring -Patient did have a hypoglycemic episode however this resolved after his 70/30 was discontinued  Code Status: Full  Family Communication: None at bedside  Disposition Plan: Admitted, pending surgery.  Time Spent in minutes   30 minutes  Procedures  None  Consults   Orthopedic surgery Infectious disease  DVT Prophylaxis  lovenox  (held for surgery)  Lab Results  Component Value Date   PLT 268 06/16/2015    Medications  Scheduled Meds: . [MAR Hold] Chlorhexidine Gluconate Cloth  6 each Topical Q0600  . [MAR Hold] fentaNYL  50 mcg Transdermal Q72H  . [MAR Hold] gabapentin  300 mg Oral QID  . [MAR Hold] insulin aspart  0-9 Units Subcutaneous 6 times per day  . [MAR Hold] meloxicam  7.5 mg Oral Daily  . [MAR Hold] mupirocin ointment  1 application Nasal BID  . [MAR Hold] sodium chloride  3 mL Intravenous Q12H  . [MAR Hold] vancomycin  1,000 mg Intravenous To SS-Surg  Continuous Infusions: . lactated ringers 50 mL/hr at 06/18/15 1036   PRN Meds:.[MAR Hold] sodium chloride, [MAR Hold] acetaminophen **OR** [MAR Hold] acetaminophen, [MAR Hold]  HYDROmorphone (DILAUDID) injection, [MAR Hold] methocarbamol, [MAR Hold] ondansetron **OR** [MAR Hold] ondansetron (ZOFRAN) IV, [MAR Hold] oxyCODONE, [MAR Hold] sodium chloride  Antibiotics    Anti-infectives    Start     Dose/Rate Route Frequency Ordered Stop   06/18/15 1115  [MAR Hold]  vancomycin (VANCOCIN) IVPB 1000 mg/200 mL premix     (MAR Hold since 06/18/15 1022)   1,000 mg 200 mL/hr over 60 Minutes Intravenous To [facility] 06/18/15 0730 06/19/15 1115   06/12/15 1600  cefTRIAXone (ROCEPHIN) 1 g in  dextrose 5 % 50 mL IVPB  Status:  Discontinued     1 g 100 mL/hr over 30 Minutes Intravenous Every 24 hours 06/11/15 1913 06/17/15 1232   06/11/15 1615  cefTRIAXone (ROCEPHIN) 1 g in dextrose 5 % 50 mL IVPB     1 g 100 mL/hr over 30 Minutes Intravenous  Once 06/11/15 1609 06/11/15 1702        Subjective:   Jeff Ferrell seen and examined today.  Patient currently complains of hip, back pain, spasms. States that any of his body that is touched causes him to spasm. Currently denies any chest pain or shortness of breath.  Objective:   Filed Vitals:   06/17/15 0601 06/17/15 1628 06/17/15 2018 06/18/15 0442  BP: 135/78 124/79 109/64 119/63  Pulse: 111 108 102 109  Temp: 98 F (36.7 C) 98.2 F (36.8 C) 98.4 F (36.9 C) 99.1 F (37.3 C)  TempSrc: Oral Oral Oral Oral  Resp: Height:      Weight:      SpO2: 99% 94% 100% 94%    Wt Readings from Last 3 Encounters:  06/11/15 83.462 kg (184 lb)  06/16/15 83.462 kg (184 lb)  03/29/15 83.9 kg (184 lb 15.5 oz)     Intake/Output Summary (Last 24 hours) at 06/18/15 1054 Last data filed at 06/18/15 0540  Gross per 24 hour  Intake      0 ml  Output   1300 ml  Net  -1300 ml    Exam  General: Well developed, well nourished, mild distress, appears stated age  HEENT: NCAT,mucous membranes moist.   Cardiovascular: S1 S2 auscultated, no rubs, murmurs or gallops. Regular rate and rhythm.  Respiratory: Clear to auscultation bilaterally with equal chest rise  Abdomen: Soft, nontender, nondistended, + bowel sounds  Extremities: warm dry without cyanosis clubbing or edema. Range of motion limited due to severe pain with movement  Neuro: AAOx3, nonfocal  Skin: Multiple areas of scabbing noted on the knees and legs  Psych: Appropriate mood and affect  Data Review   Micro Results Recent Results (from the past 240 hour(s))  Urine culture     Status: None   Collection Time: 06/16/15  4:39 PM  Result Value Ref Range  Status   Specimen Description URINE, CLEAN CATCH  Final   Special Requests Normal  Final   Culture   Final    MULTIPLE SPECIES PRESENT, SUGGEST RECOLLECTION Performed at [facility]    Report Status 06/18/2015 FINAL  Final  Surgical pcr screen     Status: Abnormal   Collection Time: 06/17/15 11:59 PM  Result Value Ref Range Status   MRSA, PCR POSITIVE (A) NEGATIVE Final    Comment: RESULT CALLED TO, READ BACK BY AND VERIFIED WITH: R SNIDERMAN @  0301 06/18/15 MKELLY    Staphylococcus aureus POSITIVE (A) NEGATIVE Final    Comment:        The Xpert SA Assay (FDA approved for NASAL specimens in patients over 65 years of age), is one component of a comprehensive surveillance program.  Test performance has been validated by [facility] for patients greater than or equal to [address] year old. It is not intended to diagnose infection nor to guide or monitor treatment.     Radiology Reports Mr Lumbar Spine W Wo Contrast  06/11/2015  CLINICAL DATA:  Severe low back pain radiating to the left leg EXAM: MRI LUMBAR SPINE WITHOUT AND WITH CONTRAST TECHNIQUE: Multiplanar and multiecho pulse sequences of the lumbar spine were obtained without and with intravenous contrast. CONTRAST:  17mL MULTIHANCE GADOBENATE DIMEGLUMINE 529 MG/ML IV SOLN COMPARISON:  None. FINDINGS: Severe L5 vertebral body height loss. The vertebral bodies of the lumbar spine are normal in alignment. There is normal bone marrow signal demonstrated throughout the vertebra. Posterior lumbar fusion from L2 through S1. Severe L4-5 disc space narrowing. There is increased T2 signal within the L5-S1 disc space with mild disc enhancement. There is no paravertebral fluid collection or edema. The spinal cord is normal in signal and contour. The cord terminates normally at T12 . The nerve roots of the cauda equina and the filum terminale are normal. The visualized portions of the SI joints are unremarkable. The imaged intra-abdominal  contents are unremarkable. T12-L1: No significant disc bulge. No evidence of neural foraminal stenosis. No central canal stenosis. L1-L2: Mild broad-based disc bulge. No evidence of neural foraminal stenosis. Mild bilateral facet arthropathy. Mild spinal stenosis. L2-L3: No significant disc bulge. No evidence of neural foraminal stenosis. No central canal stenosis. L3-L4: Limited evaluation secondary to susceptibility artifact. No significant disc bulge. No evidence of neural foraminal stenosis. No central canal stenosis. L4-L5: Limited evaluation secondary to susceptibility artifact. Severe disc height loss. No evidence of neural foraminal stenosis. No central canal stenosis. L5-S1: Increased T2 signal within the L5-S1 disc space with mild disc enhancement. Mild bilateral foraminal narrowing. No central canal stenosis. IMPRESSION: 1. Increased T2 signal within the L5-S1 disc space with mild disc enhancement. There is no adjacent marrow signal abnormality. There is no paravertebral soft tissue abnormality. This may reflect early discitis versus postsurgical changes. 2. Posterior lumbar fusion from L2 through S1. 3. Severe disc height loss at L4-5 and L5 vertebral body height loss which may be secondary to prior discitis or prior trauma. Electronically Signed   By: Elige KoHetal  Patel   On: 06/11/2015 12:34   Mr Hip Left W Wo Contrast  06/17/2015  CLINICAL DATA:  Severe left hip pain since a recent fall. EXAM: MRI OF THE LEFT HIP WITHOUT AND WITH CONTRAST TECHNIQUE: Multiplanar, multisequence MR imaging was performed both before and after administration of intravenous contrast. CONTRAST:  17mL MULTIHANCE GADOBENATE DIMEGLUMINE 529 MG/ML IV SOLN COMPARISON:  Radiographs dated 06/11/2015 FINDINGS: Bones: There is a new left low femoral neck fracture with impaction along the medial aspect with a nondisplaced hairline fracture extending superiorly into the greater trochanter. There is edema in the surrounding musculature,  particularly the distal left iliopsoas muscle and in the proximal left vastus intermedius muscle as well some edema in the left adductor muscles. There is a small fluid collection adjacent to the left lesser trochanter. There is surprisingly little edema in the femur at the site of the fracture. There is only slight enhancement at the fracture site after contrast  administration. No other acute abnormality of the bones of the pelvis or hips. Previous lumbosacral fusion. The bladder is markedly distended to at least the level of the umbilicus. IMPRESSION: 1. New left low femoral neck impaction fracture along the medial cortex without displacement. This was not visible on the prior radiographs. 2. Edema in the adjacent musculature as described. 3. Marked distention of the bladder. Electronically Signed   By: Francene Boyers M.D.   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 Irregularity and increased sclerosis is noted just below the head of the second metatarsal consistent with subacute injury. No fracture is identified. No bony erosion to suggest osteomyelitis is seen. IMPRESSION: Postoperative change. Changes consistent with a subacute fracture in the distal second metatarsal. No other focal abnormality is seen. Electronically Signed   By: Alcide Clever M.D.   On: 06/11/2015 10:05   Dg Hip Unilat With Pelvis 2-3 Views Left  06/11/2015  CLINICAL DATA:  Pain following fall EXAM: DG HIP (WITH OR WITHOUT PELVIS) 2-3V LEFT COMPARISON:  November 30, 2006 FINDINGS: Frontal pelvis as well as frontal and lateral left hip images were obtained. There is slight symmetric narrowing of both hip joints. No fracture or dislocation. No erosive change. There is extensive postoperative  change in the lower lumbar spine and upper sacral regions. IMPRESSION: No acute fracture or dislocation. Mild symmetric narrowing both hip joints. Extensive postoperative change in the visualized lower lumbar spine and upper sacral regions. Electronically Signed   By: Bretta Bang III M.D.   On: 06/11/2015 09:45    CBC  Recent Labs Lab 06/11/15 1106 06/12/15 0517 06/16/15 0445  WBC 7.4 7.6 7.8  HGB 12.9* 14.0 12.7*  HCT 37.3* 41.5 37.9*  PLT 301 271 268  MCV 89.2 91.4 89.4  MCH 30.9 30.8 30.0  MCHC 34.6 33.7 33.5  RDW 12.3 12.3 12.2  LYMPHSABS 1.2  --   --   MONOABS 1.1*  --   --   EOSABS 0.1  --   --   BASOSABS 0.1  --   --     Chemistries   Recent Labs Lab 06/12/15 0517 06/16/15 0445  NA 134* 132*  K 4.4 4.1  CL 97* 95*  CO2 25 27  GLUCOSE 125* 97  BUN 13 12  CREATININE 0.72 0.68  CALCIUM 9.2 9.4   ------------------------------------------------------------------------------------------------------------------ estimated creatinine clearance is 92.1 mL/min (by C-G formula based on Cr of 0.68). ------------------------------------------------------------------------------------------------------------------ No results for input(s): HGBA1C in the last 72 hours. ------------------------------------------------------------------------------------------------------------------ No results for input(s): CHOL, HDL, LDLCALC, TRIG, CHOLHDL, LDLDIRECT in the last 72 hours. ------------------------------------------------------------------------------------------------------------------ No results for input(s): TSH, T4TOTAL, T3FREE, THYROIDAB in the last 72 hours.  Invalid input(s): FREET3 ------------------------------------------------------------------------------------------------------------------ No results for input(s): VITAMINB12, FOLATE, FERRITIN, TIBC, IRON, RETICCTPCT in the last 72 hours.  Coagulation profile No results for input(s): INR, PROTIME in the  last 168 hours.  No results for input(s): DDIMER in the last 72 hours.  Cardiac Enzymes No results for input(s): CKMB, TROPONINI, MYOGLOBIN in the last 168 hours.  Invalid input(s): CK ------------------------------------------------------------------------------------------------------------------ Invalid input(s): POCBNP    Diondra Pines D.O. on 06/18/2015 at 10:54 AM  Between 7am to 7pm - Pager - [phone]  After 7pm go to [email or web address] - password  [email or web address]  And look for the night coverage person covering for me after hours  [facility] Office  [phone]

## 2015-06-18 NOTE — Op Note (Signed)
   Date of Surgery: 06/18/2015  INDICATIONS: [name] is a 64 y.o.-year-old male who sustained a left hip fracture. The risks and benefits of the procedure discussed with the patient prior to the procedure and all questions were answered; consent was obtained.  PREOPERATIVE DIAGNOSIS: left hip fracture   POSTOPERATIVE DIAGNOSIS: Same   PROCEDURE: Treatment of intertrochanteric, pertrochanteric, subtrochanteric fracture with intramedullary implant. CPT [phone]   SURGEON: N. Glee ArvinMichael Xu, M.D.   ANESTHESIA: general   IV FLUIDS AND URINE: See anesthesia record   ESTIMATED BLOOD LOSS: 200 cc  IMPLANTS: [employer] InterTAN 10 x 38, 100/95  DRAINS: None.   COMPLICATIONS: None.   DESCRIPTION OF PROCEDURE: The patient was brought to the operating room and placed supine on the operating table. The patient's leg had been signed prior to the procedure. The patient had the anesthesia placed by the anesthesiologist. The prep verification and incision time-outs were performed to confirm that this was the correct patient, site, side and location. The patient had an SCD on the opposite lower extremity. The patient did receive antibiotics prior to the incision and was re-dosed during the procedure as needed at indicated intervals. The patient was positioned on the fracture table with the table in traction and internal rotation to reduce the hip. The well leg was placed in a scissor position and all bony prominences were well-padded. The patient had the lower extremity prepped and draped in the standard surgical fashion. The incision was made 4 finger breadths superior to the greater trochanter. A guide pin was inserted into the tip of the greater trochanter under fluoroscopic guidance. An opening reamer was used to gain access to the femoral canal. The nail length was measured and inserted down the femoral canal to its proper depth. The appropriate version of insertion for the lag screw was found under  fluoroscopy. A pin was inserted up the femoral neck through the jig. Then, a second antirotation pin was inserted inferior to the first pin. The length of the lag screw was then measured. The lag screw was inserted as near to center-center in the head as possible. The antirotation pin was then taken out and an interdigitating compression screw was placed in its place. The leg was taken out of traction, then the interdigitating compression screw was used to compress across the fracture. Compression was visualized on serial xrays. The wound was copiously irrigated with saline and the subcutaneous layer closed with 2.0 vicryl and the skin was reapproximated with staples. The wounds were cleaned and dried a final time and a sterile dressing was placed. The hip was taken through a range of motion at the end of the case under fluoroscopic imaging to visualize the approach-withdraw phenomenon and confirm implant length in the head. The patient was then awakened from anesthesia and taken to the recovery room in stable condition. All counts were correct at the end of the case.   POSTOPERATIVE PLAN: The patient will be weight bearing as tolerated and will return in 2 weeks for staple removal and the patient will receive DVT prophylaxis based on other medications, activity level, and risk ratio of bleeding to thrombosis.   Mayra ReelN. Michael Xu, MD [facility] [phone] 12:35 PM

## 2015-06-18 NOTE — Progress Notes (Signed)
Orthopedic Tech Progress Note Patient Details:  Jeff FerrariHarold Ferrell 03/13/1951 [id number]    Trapeze bar pt. Helper   Trinna PostMartinez, [name] 06/18/2015, 2:33 PM

## 2015-06-18 NOTE — Anesthesia Postprocedure Evaluation (Signed)
Anesthesia Post Note  Patient: Jeff Ferrell  Procedure(s) Performed: Procedure(s) (LRB): INTRAMEDULLARY (IM) NAIL FEMORAL (Left)  Patient location during evaluation: PACU Anesthesia Type: General Level of consciousness: awake and alert Pain management: pain level controlled Vital Signs Assessment: post-procedure vital signs reviewed and stable Respiratory status: spontaneous breathing Cardiovascular status: blood pressure returned to baseline Anesthetic complications: no    Last Vitals:  Filed Vitals:   06/18/15 1345 06/18/15 1354  BP: 107/67 106/69  Pulse: 110 110  Temp:  36.5 C  Resp: 12 15    Last Pain:  Filed Vitals:   06/18/15 1355  PainSc: 4                  Kennieth RadFitzgerald, Lawonda Pretlow E

## 2015-06-19 DIAGNOSIS — K5900 Constipation, unspecified: Secondary | ICD-10-CM

## 2015-06-19 DIAGNOSIS — R339 Retention of urine, unspecified: Secondary | ICD-10-CM

## 2015-06-19 LAB — GLUCOSE, CAPILLARY
GLUCOSE-CAPILLARY: 136 mg/dL — AB (ref 65–99)
GLUCOSE-CAPILLARY: 153 mg/dL — AB (ref 65–99)
GLUCOSE-CAPILLARY: 156 mg/dL — AB (ref 65–99)
GLUCOSE-CAPILLARY: 162 mg/dL — AB (ref 65–99)
Glucose-Capillary: 194 mg/dL — ABNORMAL HIGH (ref 65–99)
Glucose-Capillary: 169 mg/dL — ABNORMAL HIGH (ref 65–99)
Glucose-Capillary: 137 mg/dL — ABNORMAL HIGH (ref 65–99)

## 2015-06-19 LAB — BASIC METABOLIC PANEL
Anion gap: 9 (ref 5–15)
BUN: 9 mg/dL (ref 6–20)
CALCIUM: 8.7 mg/dL — AB (ref 8.9–10.3)
CHLORIDE: 94 mmol/L — AB (ref 101–111)
CO2: 26 mmol/L (ref 22–32)
CREATININE: 0.68 mg/dL (ref 0.61–1.24)
Glucose, Bld: 170 mg/dL — ABNORMAL HIGH (ref 65–99)
Potassium: 4.7 mmol/L (ref 3.5–5.1)
SODIUM: 129 mmol/L — AB (ref 135–145)

## 2015-06-19 LAB — URINALYSIS, ROUTINE W REFLEX MICROSCOPIC
GLUCOSE, UA: NEGATIVE mg/dL
HGB URINE DIPSTICK: NEGATIVE
KETONES UR: NEGATIVE mg/dL
Nitrite: NEGATIVE
PH: 6 (ref 5.0–8.0)
PROTEIN: NEGATIVE mg/dL
SPECIFIC GRAVITY, URINE: 1.022 (ref 1.005–1.030)

## 2015-06-19 LAB — CBC
HCT: 29.2 % — ABNORMAL LOW (ref 39.0–52.0)
HEMOGLOBIN: 9.5 g/dL — AB (ref 13.0–17.0)
MCH: 28.8 pg (ref 26.0–34.0)
MCHC: 32.5 g/dL (ref 30.0–36.0)
MCV: 88.5 fL (ref 78.0–100.0)
PLATELETS: 256 10*3/uL (ref 150–400)
RBC: 3.3 MIL/uL — ABNORMAL LOW (ref 4.22–5.81)
RDW: 11.8 % (ref 11.5–15.5)
WBC: 12 10*3/uL — ABNORMAL HIGH (ref 4.0–10.5)

## 2015-06-19 LAB — URINE MICROSCOPIC-ADD ON: BACTERIA UA: NONE SEEN

## 2015-06-19 MED ORDER — INSULIN ASPART 100 UNIT/ML ~~LOC~~ SOLN
0.0000 [IU] | Freq: Three times a day (TID) | SUBCUTANEOUS | Status: DC
  Administered 2015-06-19 – 2015-06-20 (×3): 2 [IU] via SUBCUTANEOUS
  Administered 2015-06-20: 3 [IU] via SUBCUTANEOUS
  Administered 2015-06-21: 2 [IU] via SUBCUTANEOUS
  Administered 2015-06-21: 3 [IU] via SUBCUTANEOUS
  Administered 2015-06-22 (×2): 2 [IU] via SUBCUTANEOUS

## 2015-06-19 MED ORDER — INSULIN ASPART 100 UNIT/ML ~~LOC~~ SOLN
10.0000 [IU] | Freq: Once | SUBCUTANEOUS | Status: AC
  Administered 2015-06-19: 10 [IU] via SUBCUTANEOUS

## 2015-06-19 MED ORDER — POLYETHYLENE GLYCOL 3350 17 G PO PACK
17.0000 g | PACK | Freq: Every day | ORAL | Status: DC
  Administered 2015-06-19 – 2015-06-21 (×3): 17 g via ORAL
  Filled 2015-06-19 (×4): qty 1

## 2015-06-19 MED ORDER — TAMSULOSIN HCL 0.4 MG PO CAPS
0.4000 mg | ORAL_CAPSULE | Freq: Every day | ORAL | Status: DC
  Administered 2015-06-19 – 2015-06-23 (×5): 0.4 mg via ORAL
  Filled 2015-06-19 (×5): qty 1

## 2015-06-19 NOTE — Progress Notes (Signed)
   Subjective:  Patient reports pain as mild.    Objective:   VITALS:   Filed Vitals:   06/18/15 1354 06/18/15 1411 06/18/15 2201 06/19/15 0420  BP: 106/69 106/66 105/54 110/48  Pulse: 110 113 122 127  Temp: 97.7 F (36.5 C) 98.1 F (36.7 C) 100.1 F (37.8 C) 98.7 F (37.1 C)  TempSrc:  Oral Oral Oral  Resp: 15 15 16 18   Height:      Weight:      SpO2: 100% 99% 93% 92%    Neurologically intact Neurovascular intact Sensation intact distally Intact pulses distally Dorsiflexion/Plantar flexion intact Incision: dressing C/D/I and no drainage No cellulitis present Compartment soft   Lab Results  Component Value Date   WBC 12.0* 06/19/2015   HGB 9.5* 06/19/2015   HCT 29.2* 06/19/2015   MCV 88.5 06/19/2015   PLT 256 06/19/2015     Assessment/Plan:  1 Day Post-Op   - Expected postop acute blood loss anemia - will monitor for symptoms - Up with PT/OT - DVT ppx - SCDs, ambulation, lovenox - WBAT operative extremity - Pain control - Discharge planning  Cheral AlmasXu, Naiping Michael 06/19/2015, 10:16 AM [phone]

## 2015-06-19 NOTE — Evaluation (Signed)
Occupational Therapy Evaluation Patient Details Name: Jeff Ferrell MRN: [id number] DOB: 10/12/1950 Today's Date: 06/19/2015    History of Present Illness 64 yr old male admitted through ED with L hip pain, weakness, hx of falls and inability to ambulate at home.  Pt wtih recent hx of L great toe amp 03/30/15 and recent fall with multiple rib fxs.  Pt with hx significant back surgery and chronic back pain.  Continued workup revealed left hip fx.  Underwent IM Nail on 06/18/15.     Clinical Impression   Patient is s/p IM nail R femur surgery resulting in functional limitations due to the deficits listed below (see OT problem list). pTA multiple falls and decr cognition this session. Question accuracy in reporting to therapist. Chart notes multiple falls PTA.  Patient will benefit from skilled OT acutely to increase independence and safety with ADLS to allow discharge SNF.     Follow Up Recommendations  SNF    Equipment Recommendations  Hospital bed;Wheelchair (measurements OT);Wheelchair cushion (measurements OT);3 in 1 bedside comode    Recommendations for Other Services       Precautions / Restrictions Precautions Precautions: Fall Restrictions Weight Bearing Restrictions: Yes LLE Weight Bearing: Weight bearing as tolerated      Mobility Bed Mobility Overal bed mobility: Needs Assistance Bed Mobility: Sit to Supine     Supine to sit: Mod assist;+2 for physical assistance Sit to supine: +2 for physical assistance;Total assist   General bed mobility comments: pt with hip flexion bil and not assisting RN or therapist. pt remained on R side. Pt reports "its fine"   Transfers Overall transfer level: Needs assistance Equipment used: Rolling walker (2 wheeled) Transfers: Sit to/from Stand Sit to Stand: +2 physical assistance;Max assist Stand pivot transfers: Total assist;+2 physical assistance       General transfer comment: Pt squat pivot to the R. pt needed hand  over hand max (A) to place hand on chair to push up into standing. pt attempting pt pull on therapist. Pt achieve standing then when initiating transfer patietn terminates physical (A and became total +2 total (A) squat pivot. pt pulling on therapist with max v/c not following command. recommend only transfer with DME now    Balance Overall balance assessment: Needs assistance Sitting-balance support: No upper extremity supported Sitting balance-Leahy Scale: Fair     Standing balance support: Bilateral upper extremity supported;During functional activity Standing balance-Leahy Scale: Zero                              ADL                                         General ADL Comments: total +2 total (A) for chair transfer so recommend urinal and bed pan to RN staff. pt unsafe to transfer to bedside commode at this time. Pt very unpredicatable this session. Pt will be bed level bathing at max (A) level for LB. pt refusing food at this time. Pt drinking with setup and then declined further PO intake.      Vision     Perception     Praxis      Pertinent Vitals/Pain Pain Assessment: Faces Pain Score: 10-Worst pain ever Faces Pain Scale: Hurts even more Pain Location: patient did not verbalized - ignored therapist- states you can go now- moan -  states No  then moans Pain Descriptors / Indicators: Spasm Pain Intervention(s): Limited activity within patient's tolerance;Monitored during session;Premedicated before session;Repositioned     Hand Dominance     Extremity/Trunk Assessment Upper Extremity Assessment Upper Extremity Assessment: Generalized weakness   Lower Extremity Assessment Lower Extremity Assessment: Defer to PT evaluation;LLE deficits/detail LLE Deficits / Details: sudden spasms with inability to verbalize . pt jumping with therapist moving covers and not touching L LE in anticipation   Cervical / Trunk Assessment Cervical / Trunk  Assessment: Kyphotic   Communication Communication Communication: No difficulties   Cognition Arousal/Alertness: Awake/alert Behavior During Therapy: Impulsive Overall Cognitive Status: Impaired/Different from baseline Area of Impairment: Awareness           Awareness: Intellectual   General Comments: Pt attempting to have a conversation on the phone with no one on the phone. Pt when cued to the situation  "can you ask the person you are talking to to hold so that we can help you back to bed?" pt states "oh this oh no one on here i dont think" Pt requesting to get in the bed and then declining then agreeable   General Comments       Exercises       Shoulder Instructions      Home Living Family/patient expects to be discharged to:: Skilled nursing facility                                        Prior Functioning/Environment Level of Independence: Independent with assistive device(s)             OT Diagnosis: Generalized weakness;Cognitive deficits;Acute pain   OT Problem List:     OT Treatment/Interventions:      OT Goals(Current goals can be found in the care plan section) Acute Rehab OT Goals Patient Stated Goal: none stated  OT Frequency:     Barriers to D/C:            Co-evaluation              End of Session Equipment Utilized During Treatment: Gait belt Nurse Communication: Mobility status;Precautions (Rn assess CBG currently due to cognition)  Activity Tolerance: Other (comment) (cognitive deficits ) Patient left: in bed;with call bell/phone within reach;with bed alarm set;with nursing/sitter in room   Time: 1213-1248 OT Time Calculation (min): 35 min Charges:  OT General Charges $OT Visit: 1 Procedure OT Evaluation $Initial OT Evaluation Tier I: 1 Procedure OT Treatments $Self Care/Home Management : 8-22 mins G-Codes:    Jeff Ferrell June 23, 2015, 3:37 PM   Jeff Ferrell   OTR/L Pager: [phone] Office:  [phone] .

## 2015-06-19 NOTE — Clinical Social Work Note (Signed)
CSW met with patient to present bed offers for SNF placement, patient stated he will look at the list and make a decision later today.  Patient stated he would rather go home if he can, but is willing to consider going to SNF.  Patient stated he did not want CSW to contact any family, CSW will follow up with patient on Sunday to see if he has made a decision.  CSW to continue to follow patient's progress throughout discharge planning.  Jones Broom. Hazely Sealey, MSW, [place] 06/19/2015 11:33 AM

## 2015-06-19 NOTE — Progress Notes (Signed)
Physical Therapy Treatment Patient Details Name: Jeff Ferrell MRN: [id number] DOB: 06-20-1950 Today's Date: 06/19/2015    History of Present Illness 64 yr old male admitted through ED with L hip pain, weakness, hx of falls and inability to ambulate at home.  Pt wtih recent hx of L great toe amp 03/30/15 and recent fall with multiple rib fxs.  Pt with hx significant back surgery and chronic back pain.  Continued workup revealed left hip fx.  Underwent IM Nail on 06/18/15.      PT Comments    Patient with IM nailing yesterday and continues to present with increase LLE spasms and pain leading to dependencies in mobility.  Agree that patient needs continued inpatient post-acute therapies and recommend SNF placement.  Uncertain if patient will be able to progress to gait, may need to remain at w/c level and focus more on bed-to-chair transfers.  Will benefit from continued PT to progress mobility.     Follow Up Recommendations  SNF     Equipment Recommendations  Other (comment) (to be determined with increased mobility)    Recommendations for Other Services       Precautions / Restrictions Precautions Precautions: Fall Restrictions Weight Bearing Restrictions: Yes LLE Weight Bearing: Weight bearing as tolerated    Mobility  Bed Mobility Overal bed mobility: Needs Assistance Bed Mobility: Supine to Sit     Supine to sit: Mod assist;+2 for physical assistance     General bed mobility comments: assist for LLE, assist to raise shoulders off bed and assist to scoot in bed; cueing and education on technique and sequence.  Transfers Overall transfer level: Needs assistance Equipment used: Rolling walker (2 wheeled) Transfers: Sit to/from [employer] Sit to Stand: Max assist;+2 physical assistance (unable to reach standing) Stand pivot transfers: Max assist       General transfer comment: patient unable to reach standing even with +2 max assist due to pain and  spasm.  Patient returned to sitting.  Performed bed-to-chair standpivot transfer to recliner.    Ambulation/Gait Ambulation/Gait assistance:  (unable)               Stairs            Wheelchair Mobility    Modified Rankin (Stroke Patients Only)       Balance Overall balance assessment: Needs assistance Sitting-balance support: No upper extremity supported Sitting balance-Leahy Scale: Fair                              Cognition Arousal/Alertness: Awake/alert Behavior During Therapy: WFL for tasks assessed/performed Overall Cognitive Status: Within Functional Limits for tasks assessed                      Exercises General Exercises - Lower Extremity Ankle Circles/Pumps: AROM;Both;10 reps;Seated    General Comments        Pertinent Vitals/Pain Pain Score: 10-Worst pain ever Pain Location: left hip during spasm Pain Descriptors / Indicators: Spasm;Crushing;Grimacing Pain Intervention(s): Limited activity within patient's tolerance;Monitored during session;Repositioned;Premedicated before session    Home Living                      Prior Function            PT Goals (current goals can now be found in the care plan section) Progress towards PT goals: Progressing toward goals    Frequency  Min 3X/week  PT Plan Current plan remains appropriate (goals remain appropriate)    Co-evaluation             End of Session Equipment Utilized During Treatment: Gait belt Activity Tolerance: Patient limited by pain Patient left: in chair;with call bell/phone within reach     Time: [id number] PT Time Calculation (min) (ACUTE ONLY): 24 min  Charges:  $Gait Training: 23-37 mins                    G Codes:      Olivia CanterMoton, Nate Perri M 06/19/2015, 12:11 PM  06/19/2015 Corlis HoveMargie Tighe Gitto, PT [phone]

## 2015-06-19 NOTE — Progress Notes (Addendum)
[facility]                                                                              Patient Demographics  Jeff Ferrell, is a 64 y.o. male, DOB - 04/02/1951, ZOX:[id number]  Admit date - 06/11/2015   Admitting Physician Penny Pia, MD  Outpatient Primary MD for the patient is [name], MD  LOS - 6   Chief Complaint  Patient presents with  . Hip Pain  . Fall      HPI on 06/11/2015 by Dr. Penny Pia Jeff Ferrell is a 64 y.o. male Patient has a history of extensive back surgery in 2011 with lumbar fusion of L2-S1 PLIF with hardware multilevel laminectomies at the [place] in [place]. He is presenting with 2 day complaint of back discomfort. He has had back pain for years but states that within last 2 days his pain medication regimen at home has not been helping him. Nothing he is aware of makes it better. Activity makes it worse. He denies any fevers. Given persistence in symptoms patient presented to the hospital for further evaluation recommendations.  In the ED patient was evaluated by ID physician who did not recommend any further workup for patient's back. Given intractable back pain and urinary tract infection we were consulted for further evaluation recommendations.  Interim history: Patient was transferred to [facility] for surgical repair by orthopedics- 12/30.  Pending PT/OT. Developed urinary retention, placed on Flomax.   Assessment & Plan   Left femoral neck and patch and fracture with severe left hip pain/spasms -Continue pain control, oxycodone, mobile, Neurontin, Dilaudid, fentanyl patch -Orthopedic surgery consulted appreciated -MRI showed new left lower femoral neck impaction fracture along the medial cortex without displacement -Surgical repair 06/18/2015 -Pending PT/OT -Follow up with Dr. Roda Shutters in 2 weeks for staple removal.  WBAT. Continue DVT prophylaxis.  Urinary tract infection -Patient on Rocephin however held as urine cultures were not sent -UA  on 06/11/2015 showed WBC 6-30, many bacteria, small leukocytes, positive nitrites -Urine cultures pulse 29 2016 showed multiple species  Urinary retention -Patient had bladder scan 1300cc noted, foley placed -Started on flomax -may try voiding trial close to discharge  Chronic lower back pain/spasms -Patient has had a history of prior back surgeries -? Discitis noted on MRI -No further imaging recommended by ID (as noted on previous hospitalist note)  Diabetes mellitus, type II -Continue insulin sliding scale CBG monitoring -Patient did have a hypoglycemic episode however this resolved after his 70/30 was discontinued  Constipation -likely complicated by pain meds -Will add miralax , continue Reglan PRN  Anemia/Acute blood loss -secondary to surgery -Hb 9.5, baseline 13 -continue to monitor CBC, transfuse <7  Leukocytosis  -likely secondary to surgery -given ?UTI and retention, will obtain repeat UA/culture -continue to monitor CBC  Code Status: Full  Family Communication: None at bedside  Disposition Plan: Admitted, pending PT/OT  Time Spent in minutes   30 minutes  Procedures  Left Intramedullary implant  Consults   Orthopedic surgery Infectious disease  DVT Prophylaxis  lovenox    Lab Results  Component Value Date   PLT 256 06/19/2015    Medications  Scheduled Meds: . Chlorhexidine Gluconate  Cloth  6 each Topical [id number]  . enoxaparin (LOVENOX) injection  40 mg Subcutaneous Q24H  . fentaNYL  50 mcg Transdermal Q72H  . gabapentin  300 mg Oral QID  . insulin aspart  0-9 Units Subcutaneous 6 times per day  . meloxicam  7.5 mg Oral Daily  . mupirocin ointment  1 application Nasal BID  . polyethylene glycol  17 g Oral Daily  . sodium chloride  3 mL Intravenous Q12H  . tamsulosin  0.4 mg Oral Daily   Continuous Infusions: . sodium chloride 10 mL/hr at 06/19/15 0348  . lactated ringers 50 mL/hr at 06/18/15 1036   PRN Meds:.sodium chloride,  acetaminophen **OR** acetaminophen, alum & mag hydroxide-simeth, HYDROcodone-acetaminophen, HYDROmorphone (DILAUDID) injection, menthol-cetylpyridinium **OR** phenol, methocarbamol **OR** methocarbamol (ROBAXIN)  IV, metoCLOPramide **OR** metoCLOPramide (REGLAN) injection, morphine injection, ondansetron **OR** ondansetron (ZOFRAN) IV, oxyCODONE, sodium chloride  Antibiotics    Anti-infectives    Start     Dose/Rate Route Frequency Ordered Stop   06/19/15 0200  vancomycin (VANCOCIN) IVPB 1000 mg/200 mL premix     1,000 mg 200 mL/hr over 60 Minutes Intravenous Every 12 hours 06/18/15 1413 06/19/15 0448   06/18/15 1115  [MAR Hold]  vancomycin (VANCOCIN) IVPB 1000 mg/200 mL premix     (MAR Hold since 06/18/15 1022)   1,000 mg 200 mL/hr over 60 Minutes Intravenous To [facility] 06/18/15 0730 06/18/15 1235   06/12/15 1600  cefTRIAXone (ROCEPHIN) 1 g in dextrose 5 % 50 mL IVPB  Status:  Discontinued     1 g 100 mL/hr over 30 Minutes Intravenous Every 24 hours 06/11/15 1913 06/17/15 1232   06/11/15 1615  cefTRIAXone (ROCEPHIN) 1 g in dextrose 5 % 50 mL IVPB     1 g 100 mL/hr over 30 Minutes Intravenous  Once 06/11/15 1609 06/11/15 1702        Subjective:   [name] seen and examined today.  Patient continues to complain of spasms.  Feels left hip pain has improved. Currently denies chest pain or shortness of breath.  Complains of constipation.  Objective:   Filed Vitals:   06/18/15 1354 06/18/15 1411 06/18/15 2201 06/19/15 0420  BP: 106/69 106/66 105/54 110/48  Pulse: 110 113 122 127  Temp: 97.7 F (36.5 C) 98.1 F (36.7 C) 100.1 F (37.8 C) 98.7 F (37.1 C)  TempSrc:  Oral Oral Oral  Resp: 15 15 16 18   Height:      Weight:      SpO2: 100% 99% 93% 92%    Wt Readings from Last 3 Encounters:  06/11/15 83.462 kg (184 lb)  06/16/15 83.462 kg (184 lb)  03/29/15 83.9 kg (184 lb 15.5 oz)     Intake/Output Summary (Last 24 hours) at 06/19/15 1058 Last data filed  at 06/19/15 0422  Gross per 24 hour  Intake    723 ml  Output    865 ml  Net   -142 ml    Exam  General: Well developed, well nourished, NAD  HEENT: NCAT,mucous membranes moist.   Cardiovascular: S1 S2 auscultated, RRR, no murmurs  Respiratory: Clear to auscultation bilaterally   Abdomen: Soft, nontender, nondistended, + bowel sounds  Extremities: warm dry without cyanosis clubbing or edema.   Neuro: AAOx3, nonfocal  Skin: Multiple areas of scabbing noted on the knees and legs. Dressing noted.  Psych: Appropriate mood and affect  Data Review   Micro Results Recent Results (from the past 240 hour(s))  Urine culture     Status:  None   Collection Time: 06/16/15  4:39 PM  Result Value Ref Range Status   Specimen Description URINE, CLEAN CATCH  Final   Special Requests Normal  Final   Culture   Final    MULTIPLE SPECIES PRESENT, SUGGEST RECOLLECTION Performed at [facility]    Report Status 06/18/2015 FINAL  Final  Surgical pcr screen     Status: Abnormal   Collection Time: 06/17/15 11:59 PM  Result Value Ref Range Status   MRSA, PCR POSITIVE (A) NEGATIVE Final    Comment: RESULT CALLED TO, READ BACK BY AND VERIFIED WITH: R SNIDERMAN  06/18/15 MKELLY    Staphylococcus aureus POSITIVE (A) NEGATIVE Final    Comment:        The Xpert SA Assay (FDA approved for NASAL specimens in patients over 65 years of age), is one component of a comprehensive surveillance program.  Test performance has been validated by [facility] for patients greater than or equal to 68 year old. It is not intended to diagnose infection nor to guide or monitor treatment.     Radiology Reports Mr Lumbar Spine W Wo Contrast  06/11/2015  CLINICAL DATA:  Severe low back pain radiating to the left leg EXAM: MRI LUMBAR SPINE WITHOUT AND WITH CONTRAST TECHNIQUE: Multiplanar and multiecho pulse sequences of the lumbar spine were obtained without and with intravenous contrast.  CONTRAST:  17mL MULTIHANCE GADOBENATE DIMEGLUMINE 529 MG/ML IV SOLN COMPARISON:  None. FINDINGS: Severe L5 vertebral body height loss. The vertebral bodies of the lumbar spine are normal in alignment. There is normal bone marrow signal demonstrated throughout the vertebra. Posterior lumbar fusion from L2 through S1. Severe L4-5 disc space narrowing. There is increased T2 signal within the L5-S1 disc space with mild disc enhancement. There is no paravertebral fluid collection or edema. The spinal cord is normal in signal and contour. The cord terminates normally at T12 . The nerve roots of the cauda equina and the filum terminale are normal. The visualized portions of the SI joints are unremarkable. The imaged intra-abdominal contents are unremarkable. T12-L1: No significant disc bulge. No evidence of neural foraminal stenosis. No central canal stenosis. L1-L2: Mild broad-based disc bulge. No evidence of neural foraminal stenosis. Mild bilateral facet arthropathy. Mild spinal stenosis. L2-L3: No significant disc bulge. No evidence of neural foraminal stenosis. No central canal stenosis. L3-L4: Limited evaluation secondary to susceptibility artifact. No significant disc bulge. No evidence of neural foraminal stenosis. No central canal stenosis. L4-L5: Limited evaluation secondary to susceptibility artifact. Severe disc height loss. No evidence of neural foraminal stenosis. No central canal stenosis. L5-S1: Increased T2 signal within the L5-S1 disc space with mild disc enhancement. Mild bilateral foraminal narrowing. No central canal stenosis. IMPRESSION: 1. Increased T2 signal within the L5-S1 disc space with mild disc enhancement. There is no adjacent marrow signal abnormality. There is no paravertebral soft tissue abnormality. This may reflect early discitis versus postsurgical changes. 2. Posterior lumbar fusion from L2 through S1. 3. Severe disc height loss at L4-5 and L5 vertebral body height loss which may be  secondary to prior discitis or prior trauma. Electronically Signed   By: Elige Ko   On: 06/11/2015 12:34   Mr Hip Left W Wo Contrast  06/17/2015  CLINICAL DATA:  Severe left hip pain since a recent fall. EXAM: MRI OF THE LEFT HIP WITHOUT AND WITH CONTRAST TECHNIQUE: Multiplanar, multisequence MR imaging was performed both before and after administration of intravenous contrast. CONTRAST:  17mL MULTIHANCE GADOBENATE  DIMEGLUMINE 529 MG/ML IV SOLN COMPARISON:  Radiographs dated 06/11/2015 FINDINGS: Bones: There is a new left low femoral neck fracture with impaction along the medial aspect with a nondisplaced hairline fracture extending superiorly into the greater trochanter. There is edema in the surrounding musculature, particularly the distal left iliopsoas muscle and in the proximal left vastus intermedius muscle as well some edema in the left adductor muscles. There is a small fluid collection adjacent to the left lesser trochanter. There is surprisingly little edema in the femur at the site of the fracture. There is only slight enhancement at the fracture site after contrast administration. No other acute abnormality of the bones of the pelvis or hips. Previous lumbosacral fusion. The bladder is markedly distended to at least the level of the umbilicus. IMPRESSION: 1. New left low femoral neck impaction fracture along the medial cortex without displacement. This was not visible on the prior radiographs. 2. Edema in the adjacent musculature as described. 3. Marked distention of the bladder. Electronically Signed   By: Francene Boyers M.D.   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 Irregularity and increased sclerosis is noted just below the head of the  second metatarsal consistent with subacute injury. No fracture is identified. No bony erosion to suggest osteomyelitis is seen. IMPRESSION: Postoperative change. Changes consistent with a subacute fracture in the distal second metatarsal. No other focal abnormality is seen. Electronically Signed   By: Alcide Clever M.D.   On: 06/11/2015 10:05   Dg Hip Operative Unilat With Pelvis Left  06/18/2015  CLINICAL DATA:  Left femur IM nail EXAM: OPERATIVE LEFT HIP (WITH PELVIS IF PERFORMED) 2 VIEWS TECHNIQUE: Fluoroscopic spot image(s) were submitted for interpretation post-operatively. COMPARISON:  06/11/2015 MRI 06/16/2015 FINDINGS: Placement of dynamic hip screws and intra medullary nail seen on these images over the left hip. No hardware or bony complicating feature. IMPRESSION: Internal fixation across the the left femoral neck fracture. Electronically Signed   By: Charlett Nose M.D.   On: 06/18/2015 13:13   Dg Hip Unilat With Pelvis 2-3 Views Left  06/11/2015  CLINICAL DATA:  Pain following fall EXAM: DG HIP (WITH OR WITHOUT PELVIS) 2-3V LEFT COMPARISON:  November 30, 2006 FINDINGS: Frontal pelvis as well as frontal and lateral left hip images were obtained. There is slight symmetric narrowing of both hip joints. No fracture or dislocation. No erosive change. There is extensive postoperative change in the lower lumbar spine and upper sacral regions. IMPRESSION: No acute fracture or dislocation. Mild symmetric narrowing both hip joints. Extensive postoperative change in the visualized lower lumbar spine and upper sacral regions. Electronically Signed   By: Bretta Bang III M.D.   On: 06/11/2015 09:45    CBC  Recent Labs Lab 06/16/15 0445 06/18/15 1535 06/19/15 0647  WBC 7.8 7.1 12.0*  HGB 12.7* 11.3* 9.5*  HCT 37.9* 33.2* 29.2*  PLT 268 277 256  MCV 89.4 88.1 88.5  MCH 30.0 30.0 28.8  MCHC 33.5 34.0 32.5  RDW 12.2 12.1 11.8    Chemistries   Recent Labs Lab 06/16/15 0445 06/18/15 1535  06/19/15 0647  NA 132*  --  129*  K 4.1  --  4.7  CL 95*  --  94*  CO2 27  --  26  GLUCOSE 97  --  170*  BUN 12  --  9  CREATININE 0.68 0.74 0.68  CALCIUM 9.4  --  8.7*   ------------------------------------------------------------------------------------------------------------------ estimated creatinine clearance is 92.1 mL/min (by C-G formula based on Cr of 0.68). ------------------------------------------------------------------------------------------------------------------ No results for input(s): HGBA1C in the last 72 hours. ------------------------------------------------------------------------------------------------------------------ No results for input(s): CHOL, HDL, LDLCALC, TRIG, CHOLHDL, LDLDIRECT in the last 72 hours. ------------------------------------------------------------------------------------------------------------------ No results for input(s): TSH, T4TOTAL, T3FREE, THYROIDAB in the last 72 hours.  Invalid input(s): FREET3 ------------------------------------------------------------------------------------------------------------------ No results for input(s): VITAMINB12, FOLATE, FERRITIN, TIBC, IRON, RETICCTPCT in the last 72 hours.  Coagulation profile No results for input(s): INR, PROTIME in the last 168 hours.  No results for input(s): DDIMER in the last 72 hours.  Cardiac Enzymes No results for input(s): CKMB, TROPONINI, MYOGLOBIN in the last 168 hours.  Invalid input(s): CK ------------------------------------------------------------------------------------------------------------------ Invalid input(s): POCBNP    Lirio Bach D.O. on 06/19/2015 at 10:58 AM  Between 7am to 7pm - Pager - [phone]  After 7pm go to [email or web address] - password [email or web address]  And look for the night coverage person covering for me after hours  [facility] Office  [phone]

## 2015-06-20 DIAGNOSIS — S72002G Fracture of unspecified part of neck of left femur, subsequent encounter for closed fracture with delayed healing: Secondary | ICD-10-CM

## 2015-06-20 DIAGNOSIS — E162 Hypoglycemia, unspecified: Secondary | ICD-10-CM

## 2015-06-20 DIAGNOSIS — E871 Hypo-osmolality and hyponatremia: Secondary | ICD-10-CM

## 2015-06-20 LAB — BASIC METABOLIC PANEL
ANION GAP: 8 (ref 5–15)
BUN: 7 mg/dL (ref 6–20)
CO2: 29 mmol/L (ref 22–32)
Calcium: 8.5 mg/dL — ABNORMAL LOW (ref 8.9–10.3)
Chloride: 93 mmol/L — ABNORMAL LOW (ref 101–111)
Creatinine, Ser: 0.71 mg/dL (ref 0.61–1.24)
GFR calc Af Amer: >60 mL/min (ref >60)
GFR calc non Af Amer: >60 mL/min (ref >60)
GLUCOSE: 153 mg/dL — AB (ref 65–99)
POTASSIUM: 4.6 mmol/L (ref 3.5–5.1)
Sodium: 130 mmol/L — ABNORMAL LOW (ref 135–145)

## 2015-06-20 LAB — CBC
HEMATOCRIT: 26.1 % — AB (ref 39.0–52.0)
Hemoglobin: 9 g/dL — ABNORMAL LOW (ref 13.0–17.0)
MCH: 30.3 pg (ref 26.0–34.0)
MCHC: 34.5 g/dL (ref 30.0–36.0)
MCV: 87.9 fL (ref 78.0–100.0)
PLATELETS: 258 10*3/uL (ref 150–400)
RBC: 2.97 MIL/uL — ABNORMAL LOW (ref 4.22–5.81)
RDW: 11.9 % (ref 11.5–15.5)
WBC: 10.3 10*3/uL (ref 4.0–10.5)

## 2015-06-20 LAB — GLUCOSE, CAPILLARY
GLUCOSE-CAPILLARY: 142 mg/dL — AB (ref 65–99)
GLUCOSE-CAPILLARY: 123 mg/dL — AB (ref 65–99)
GLUCOSE-CAPILLARY: 99 mg/dL (ref 65–99)
Glucose-Capillary: 137 mg/dL — ABNORMAL HIGH (ref 65–99)
Glucose-Capillary: 162 mg/dL — ABNORMAL HIGH (ref 65–99)
Glucose-Capillary: 186 mg/dL — ABNORMAL HIGH (ref 65–99)

## 2015-06-20 MED ORDER — SODIUM CHLORIDE 0.9 % IV SOLN
INTRAVENOUS | Status: AC
  Administered 2015-06-21: 02:00:00 via INTRAVENOUS
  Filled 2015-06-20: qty 1000

## 2015-06-20 MED ORDER — SENNA 8.6 MG PO TABS
2.0000 | ORAL_TABLET | Freq: Every day | ORAL | Status: DC
  Administered 2015-06-20 – 2015-06-21 (×2): 17.2 mg via ORAL
  Filled 2015-06-20 (×2): qty 2

## 2015-06-20 NOTE — Progress Notes (Signed)
Per RN, Pt a little confused today.  Met with Pt to discuss SNF decision.  Pt stated that he hasn't decided if he is going to SNF.  He will continue to think about it.  Weekday SW to follow up with Pt on decision.  Bernita Raisin, [place] [facility] [phone]

## 2015-06-20 NOTE — Progress Notes (Signed)
PROGRESS NOTE  Jeff Ferrell ZOX:[id number] DOB: [birth year] DOA: 06/11/2015 PCP: [name], MD Jeff Ferrell is a 65 y.o. male Patient has a history of extensive back surgery in 2011 with lumbar fusion of L2-S1 PLIF with hardware multilevel laminectomies at the [place] in [place]. He is presenting with 2 day complaint of back discomfort. He has had back pain for years but states that within last 2 days his pain medication regimen at home has not been helping him. Nothing he is aware of makes it better. Activity makes it worse. He denies any fevers. Given persistence in symptoms patient presented to the hospital for further evaluation recommendations.  In the ED patient was evaluated by ID physician who did not recommend any further workup for patient's back. Given intractable back pain and urinary tract infection we were consulted for further evaluation recommendations.  Assessment/Plan: Left femoral neck and patch and fracture with severe left hip pain/spasms -Continue pain control, oxycodone, mobile, Neurontin, Dilaudid, fentanyl patch -Orthopedic surgery consulted appreciated -MRI showed new left lower femoral neck impaction fracture along the medial cortex without displacement -Surgical repair 06/18/2015 -Pending PT/OT-->recommends SNF -Follow up with Dr. Roda Shutters in 2 weeks for staple removal. WBAT. Continue DVT prophylaxis.  Bacteruria -Patient on Rocephin however held as urine cultures were not sent -UA on 06/11/2015 showed WBC 6-30, many bacteria, small leukocytes, positive nitrites -Urine cultures showed multiple species -remain off abx--remains afebrile and hemodynamically stable  Urinary retention -Patient had bladder scan 1300cc noted, foley placed -Started on flomax -may try voiding trial close to discharge  Chronic lower back pain/spasms -Patient has had a history of prior back surgeries -? Discitis noted on MRI -No further imaging recommended by ID (as noted on  previous hospitalist note)  Diabetes mellitus, type II -Continue insulin sliding scale CBG monitoring -Patient did have a hypoglycemic episode however this resolved after his 70/30 was discontinued  Constipation -likely complicated by pain meds -Will add miralax , continue Reglan PRN  Anemia/Acute blood loss -secondary to surgery and dilution from IVF -Hb 9.5, baseline 13 -continue to monitor CBC, transfuse <7  Leukocytosis  -likely secondary to surgery -given ?UTI and retention, will obtain repeat UA/culture -continue to monitor CBC  Hyponatremia -Likely due to iron depletion from poor po intake -give 1 L NS -am BMP  Code Status: Full  Family Communication: spoke with sister on phone--total time 35 min  Disposition Plan: currently refusing SNF--discussed with sister who will speak with pt;    Time Spent in minutes 30 minutes     Procedures/Studies: Mr Lumbar Spine W Wo Contrast  06/11/2015  CLINICAL DATA:  Severe low back pain radiating to the left leg EXAM: MRI LUMBAR SPINE WITHOUT AND WITH CONTRAST TECHNIQUE: Multiplanar and multiecho pulse sequences of the lumbar spine were obtained without and with intravenous contrast. CONTRAST:  17mL MULTIHANCE GADOBENATE DIMEGLUMINE 529 MG/ML IV SOLN COMPARISON:  None. FINDINGS: Severe L5 vertebral body height loss. The vertebral bodies of the lumbar spine are normal in alignment. There is normal bone marrow signal demonstrated throughout the vertebra. Posterior lumbar fusion from L2 through S1. Severe L4-5 disc space narrowing. There is increased T2 signal within the L5-S1 disc space with mild disc enhancement. There is no paravertebral fluid collection or edema. The spinal cord is normal in signal and contour. The cord terminates normally at T12 . The nerve roots of the cauda equina and the filum terminale are normal. The visualized portions of the SI  joints are unremarkable. The imaged intra-abdominal contents are unremarkable.  T12-L1: No significant disc bulge. No evidence of neural foraminal stenosis. No central canal stenosis. L1-L2: Mild broad-based disc bulge. No evidence of neural foraminal stenosis. Mild bilateral facet arthropathy. Mild spinal stenosis. L2-L3: No significant disc bulge. No evidence of neural foraminal stenosis. No central canal stenosis. L3-L4: Limited evaluation secondary to susceptibility artifact. No significant disc bulge. No evidence of neural foraminal stenosis. No central canal stenosis. L4-L5: Limited evaluation secondary to susceptibility artifact. Severe disc height loss. No evidence of neural foraminal stenosis. No central canal stenosis. L5-S1: Increased T2 signal within the L5-S1 disc space with mild disc enhancement. Mild bilateral foraminal narrowing. No central canal stenosis. IMPRESSION: 1. Increased T2 signal within the L5-S1 disc space with mild disc enhancement. There is no adjacent marrow signal abnormality. There is no paravertebral soft tissue abnormality. This may reflect early discitis versus postsurgical changes. 2. Posterior lumbar fusion from L2 through S1. 3. Severe disc height loss at L4-5 and L5 vertebral body height loss which may be secondary to prior discitis or prior trauma. Electronically Signed   By: Elige Ko   On: 06/11/2015 12:34   Mr Hip Left W Wo Contrast  06/17/2015  CLINICAL DATA:  Severe left hip pain since a recent fall. EXAM: MRI OF THE LEFT HIP WITHOUT AND WITH CONTRAST TECHNIQUE: Multiplanar, multisequence MR imaging was performed both before and after administration of intravenous contrast. CONTRAST:  17mL MULTIHANCE GADOBENATE DIMEGLUMINE 529 MG/ML IV SOLN COMPARISON:  Radiographs dated 06/11/2015 FINDINGS: Bones: There is a new left low femoral neck fracture with impaction along the medial aspect with a nondisplaced hairline fracture extending superiorly into the greater trochanter. There is edema in the surrounding musculature, particularly the distal left  iliopsoas muscle and in the proximal left vastus intermedius muscle as well some edema in the left adductor muscles. There is a small fluid collection adjacent to the left lesser trochanter. There is surprisingly little edema in the femur at the site of the fracture. There is only slight enhancement at the fracture site after contrast administration. No other acute abnormality of the bones of the pelvis or hips. Previous lumbosacral fusion. The bladder is markedly distended to at least the level of the umbilicus. IMPRESSION: 1. New left low femoral neck impaction fracture along the medial cortex without displacement. This was not visible on the prior radiographs. 2. Edema in the adjacent musculature as described. 3. Marked distention of the bladder. Electronically Signed   By: Francene Boyers M.D.   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 Irregularity and increased sclerosis is noted just below the head of the second metatarsal consistent with subacute injury. No fracture is identified. No bony erosion to suggest osteomyelitis is seen. IMPRESSION: Postoperative change. Changes consistent with a subacute fracture in the distal second metatarsal. No other focal abnormality is seen. Electronically Signed   By: Alcide Clever M.D.   On: 06/11/2015 10:05   Dg Hip Operative Unilat With Pelvis Left  06/18/2015  CLINICAL DATA:  Left femur IM nail EXAM: OPERATIVE LEFT HIP (WITH PELVIS IF PERFORMED) 2 VIEWS TECHNIQUE: Fluoroscopic spot image(s) were submitted for interpretation post-operatively. COMPARISON:  06/11/2015 MRI 06/16/2015 FINDINGS: Placement of dynamic hip screws  and intra medullary nail seen on these images over the left hip. No hardware or bony complicating  feature. IMPRESSION: Internal fixation across the the left femoral neck fracture. Electronically Signed   By: Charlett NoseKevin  Dover M.D.   On: 06/18/2015 13:13   Dg Hip Unilat With Pelvis 2-3 Views Left  06/11/2015  CLINICAL DATA:  Pain following fall EXAM: DG HIP (WITH OR WITHOUT PELVIS) 2-3V LEFT COMPARISON:  November 30, 2006 FINDINGS: Frontal pelvis as well as frontal and lateral left hip images were obtained. There is slight symmetric narrowing of both hip joints. No fracture or dislocation. No erosive change. There is extensive postoperative change in the lower lumbar spine and upper sacral regions. IMPRESSION: No acute fracture or dislocation. Mild symmetric narrowing both hip joints. Extensive postoperative change in the visualized lower lumbar spine and upper sacral regions. Electronically Signed   By: Bretta BangWilliam  Woodruff III M.D.   On: 06/11/2015 09:45        Subjective: Patient complains of pain in the left. Denies any fevers, chills, chest pain, shortness breath, vomiting, diarrhea, abdominal pain.  Objective: Filed Vitals:   06/19/15 1300 06/19/15 2118 06/20/15 0448 06/20/15 1500  BP: 109/67 102/66 126/64 93/48  Pulse: 111 105 117 110  Temp: 98.1 F (36.7 C) 98 F (36.7 C) 98.7 F (37.1 C) 98.7 F (37.1 C)  TempSrc: Oral Oral Oral Oral  Resp: 18 18 18 18   Height:      Weight:      SpO2: 93% 93% 91% 87%    Intake/Output Summary (Last 24 hours) at 06/20/15 1854 Last data filed at 06/20/15 1700  Gross per 24 hour  Intake    360 ml  Output   1100 ml  Net   -740 ml   Weight change:  Exam:   General:  Pt is alert, follows commands appropriately, not in acute distress  HEENT: No icterus, No thrush, No neck mass, [place]/AT  Cardiovascular: RRR, S1/S2, no rubs, no gallops  Respiratory: CTA bilaterally, no wheezing, no crackles, no rhonchi  Abdomen: Soft/+BS, non tender, non distended, no guarding  Extremities: No edema, No lymphangitis, No petechiae, No rashes, no  synovitis  Data Reviewed: Basic Metabolic Panel:  Recent Labs Lab 06/16/15 0445 06/18/15 1535 06/19/15 0647 06/20/15 0530  NA 132*  --  129* 130*  K 4.1  --  4.7 4.6  CL 95*  --  94* 93*  CO2 27  --  26 29  GLUCOSE 97  --  170* 153*  BUN 12  --  9 7  CREATININE 0.68 0.74 0.68 0.71  CALCIUM 9.4  --  8.7* 8.5*   Liver Function Tests: No results for input(s): AST, ALT, ALKPHOS, BILITOT, PROT, ALBUMIN in the last 168 hours. No results for input(s): LIPASE, AMYLASE in the last 168 hours. No results for input(s): AMMONIA in the last 168 hours. CBC:  Recent Labs Lab 06/16/15 0445 06/18/15 1535 06/19/15 0647 06/20/15 0530  WBC 7.8 7.1 12.0* 10.3  HGB 12.7* 11.3* 9.5* 9.0*  HCT 37.9* 33.2* 29.2* 26.1*  MCV 89.4 88.1 88.5 87.9  PLT 268 277 256 258   Cardiac Enzymes: No results for input(s): CKTOTAL, CKMB, CKMBINDEX, TROPONINI in the last 168 hours. BNP: Invalid input(s): POCBNP CBG:  Recent Labs Lab 06/20/15 0015 06/20/15 0444 06/20/15 0826 06/20/15 1201 06/20/15 1608  GLUCAP 186* 162* 142* 123* 137*    Recent Results (from the past 240 hour(s))  Urine culture     Status: None  Collection Time: 06/16/15  4:39 PM  Result Value Ref Range Status   Specimen Description URINE, CLEAN CATCH  Final   Special Requests Normal  Final   Culture   Final    MULTIPLE SPECIES PRESENT, SUGGEST RECOLLECTION Performed at [facility]    Report Status 06/18/2015 FINAL  Final  Surgical pcr screen     Status: Abnormal   Collection Time: 06/17/15 11:59 PM  Result Value Ref Range Status   MRSA, PCR POSITIVE (A) NEGATIVE Final    Comment: RESULT CALLED TO, READ BACK BY AND VERIFIED WITH: R SNIDERMAN @0301  06/18/15 MKELLY    Staphylococcus aureus POSITIVE (A) NEGATIVE Final    Comment:        The Xpert SA Assay (FDA approved for NASAL specimens in patients over 13 years of age), is one component of a comprehensive surveillance program.  Test performance has been  validated by [facility] for patients greater than or equal to 33 year old. It is not intended to diagnose infection nor to guide or monitor treatment.   Culture, Urine     Status: None (Preliminary result)   Collection Time: 06/19/15  4:00 PM  Result Value Ref Range Status   Specimen Description URINE, CATHETERIZED  Final   Special Requests NONE  Final   Culture NO GROWTH < 24 HOURS  Final   Report Status PENDING  Incomplete     Scheduled Meds: . Chlorhexidine Gluconate Cloth  6 each Topical Q0600  . enoxaparin (LOVENOX) injection  40 mg Subcutaneous Q24H  . fentaNYL  50 mcg Transdermal Q72H  . gabapentin  300 mg Oral QID  . insulin aspart  0-15 Units Subcutaneous TID WC  . meloxicam  7.5 mg Oral Daily  . mupirocin ointment  1 application Nasal BID  . polyethylene glycol  17 g Oral Daily  . senna  2 tablet Oral Daily  . sodium chloride  3 mL Intravenous Q12H  . tamsulosin  0.4 mg Oral Daily   Continuous Infusions: . sodium chloride 0.9 % 1,000 mL infusion 75 mL/hr at 06/20/15 1507     Zoraida Havrilla, DO  [facility] Pager [phone]  If 7PM-7AM, please contact night-coverage [email or web address] Password [email or web address] 06/20/2015, 6:54 PM   LOS: 7 days

## 2015-06-20 NOTE — Progress Notes (Signed)
Patient ID: Jeff FerrariHarold Ferrell, male   DOB: 07/16/1950, 65 y.o.   MRN: [id number] No acute changes. Hgb/hct stable.  Left hip dressing changed and incisions look good.  Can continue to mobilize from ortho standpoint.

## 2015-06-20 NOTE — Care Management Note (Signed)
Case Management Note  Patient Details  Name: Jeff Ferrell MRN: [id number] Date of Birth: 05/09/1951  Subjective/Objective: 65 y.o. M admitted through the ED with Left Hip Pain. The patient has been seen by PT and it is their recommendation that he go to SNF after discharge. There are DME orders however these will be deferred until post SNF placement. CM spoke with Marchelle FolksAmanda, CSW who confirms pt is being followed for SNF placement.                    Action/Plan: Anticipate discharge to SNF. Disposition is now in the hands of SW.  No further CM needs but will be available should additional discharge needs arise.   Expected Discharge Date:   (unknown)               Expected Discharge Plan:  Skilled Nursing Facility  In-House Referral:  Clinical Social Work  Discharge planning Services  CM Consult  Post Acute Care Choice:    Choice offered to:     DME Arranged:    DME Agency:     HH Arranged:    HH Agency:     Status of Service:  In process, will continue to follow  Medicare Important Message Given:  Yes Date Medicare IM Given:    Medicare IM give by:    Date Additional Medicare IM Given:    Additional Medicare Important Message give by:     If discussed at Long Length of Stay Meetings, dates discussed:    Additional Comments:  Yvone NeuCrutchfield, Sun Kihn M, RN 06/20/2015, 10:32 AM

## 2015-06-21 ENCOUNTER — Inpatient Hospital Stay (HOSPITAL_COMMUNITY): Payer: Medicare Other

## 2015-06-21 DIAGNOSIS — D62 Acute posthemorrhagic anemia: Secondary | ICD-10-CM

## 2015-06-21 DIAGNOSIS — R000 Tachycardia, unspecified: Secondary | ICD-10-CM

## 2015-06-21 DIAGNOSIS — S72002D Fracture of unspecified part of neck of left femur, subsequent encounter for closed fracture with routine healing: Secondary | ICD-10-CM

## 2015-06-21 LAB — PREPARE RBC (CROSSMATCH)

## 2015-06-21 LAB — BASIC METABOLIC PANEL
ANION GAP: 7 (ref 5–15)
BUN: 7 mg/dL (ref 6–20)
CALCIUM: 8.4 mg/dL — AB (ref 8.9–10.3)
CO2: 29 mmol/L (ref 22–32)
CREATININE: 0.6 mg/dL — AB (ref 0.61–1.24)
Chloride: 94 mmol/L — ABNORMAL LOW (ref 101–111)
Glucose, Bld: 134 mg/dL — ABNORMAL HIGH (ref 65–99)
Potassium: 4.3 mmol/L (ref 3.5–5.1)
SODIUM: 130 mmol/L — AB (ref 135–145)

## 2015-06-21 LAB — ABO/RH: ABO/RH(D): B POS

## 2015-06-21 LAB — GLUCOSE, CAPILLARY
GLUCOSE-CAPILLARY: 124 mg/dL — AB (ref 65–99)
GLUCOSE-CAPILLARY: 101 mg/dL — AB (ref 65–99)
Glucose-Capillary: 156 mg/dL — ABNORMAL HIGH (ref 65–99)
Glucose-Capillary: 98 mg/dL (ref 65–99)

## 2015-06-21 LAB — CBC
HCT: 24.8 % — ABNORMAL LOW (ref 39.0–52.0)
Hemoglobin: 8.2 g/dL — ABNORMAL LOW (ref 13.0–17.0)
MCH: 28.8 pg (ref 26.0–34.0)
MCHC: 33.1 g/dL (ref 30.0–36.0)
MCV: 87 fL (ref 78.0–100.0)
PLATELETS: 295 10*3/uL (ref 150–400)
RBC: 2.85 MIL/uL — ABNORMAL LOW (ref 4.22–5.81)
RDW: 12 % (ref 11.5–15.5)
WBC: 7.7 10*3/uL (ref 4.0–10.5)

## 2015-06-21 LAB — URINE CULTURE: Culture: NO GROWTH

## 2015-06-21 LAB — TSH: TSH: 2.03 u[IU]/mL (ref 0.350–4.500)

## 2015-06-21 MED ORDER — SODIUM CHLORIDE 0.9 % IV SOLN: Freq: Once | INTRAVENOUS | Status: DC

## 2015-06-21 MED ORDER — GLUCERNA SHAKE PO LIQD: 237.0000 mL | Freq: Three times a day (TID) | ORAL | Status: DC

## 2015-06-21 MED ORDER — FENTANYL 25 MCG/HR TD PT72: 25.0000 ug | MEDICATED_PATCH | TRANSDERMAL | Status: DC

## 2015-06-21 MED ORDER — GLUCERNA SHAKE PO LIQD
237.0000 mL | Freq: Three times a day (TID) | ORAL | Status: DC
  Administered 2015-06-22 – 2015-06-23 (×3): 237 mL via ORAL

## 2015-06-21 MED ORDER — METOPROLOL TARTRATE 12.5 MG HALF TABLET
12.5000 mg | ORAL_TABLET | Freq: Two times a day (BID) | ORAL | Status: DC
  Administered 2015-06-22 (×2): 12.5 mg via ORAL
  Filled 2015-06-21 (×2): qty 1

## 2015-06-21 MED ORDER — BISACODYL 10 MG RE SUPP
10.0000 mg | Freq: Once | RECTAL | Status: DC
Start: 2015-06-21 — End: 2015-06-21

## 2015-06-21 MED ORDER — SODIUM CHLORIDE 0.9 % IV BOLUS (SEPSIS)
500.0000 mL | Freq: Once | INTRAVENOUS | Status: AC
Start: 2015-06-21 — End: 2015-06-21
  Administered 2015-06-21: 500 mL via INTRAVENOUS

## 2015-06-21 MED ORDER — OXYCODONE HCL 5 MG PO TABS: 5.0000 mg | ORAL_TABLET | ORAL | Status: DC | PRN

## 2015-06-21 MED ORDER — TAMSULOSIN HCL 0.4 MG PO CAPS: 0.4000 mg | ORAL_CAPSULE | Freq: Every day | ORAL | Status: DC

## 2015-06-21 NOTE — Progress Notes (Signed)
Subjective: 3 Days Post-Op Procedure(s) (LRB): INTRAMEDULLARY (IM) NAIL FEMORAL (Left) Patient reports pain as mild.    Objective: Vital signs in last 24 hours: Temp:  [98.7 F (37.1 C)-99.2 F (37.3 C)] 99.2 F (37.3 C) (01/02 0501) Pulse Rate:  [108-117] 117 (01/02 0501) Resp:  [16-18] 16 (01/02 0501) BP: (93-139)/(48-69) 139/69 mmHg (01/02 0501) SpO2:  [87 %-100 %] 100 % (01/02 0501)  Intake/Output from previous day: 01/01 0701 - 01/02 0700 In: 720 [P.O.:720] Out: 1100 [Urine:1100] Intake/Output this shift:     Recent Labs  06/18/15 1535 06/19/15 0647 06/20/15 0530 06/21/15 0402  HGB 11.3* 9.5* 9.0* 8.2*    Recent Labs  06/20/15 0530 06/21/15 0402  WBC 10.3 7.7  RBC 2.97* 2.85*  HCT 26.1* 24.8*  PLT 258 295    Recent Labs  06/20/15 0530 06/21/15 0402  NA 130* 130*  K 4.6 4.3  CL 93* 94*  CO2 29 29  BUN 7 7  CREATININE 0.71 0.60*  GLUCOSE 153* 134*  CALCIUM 8.5* 8.4*   No results for input(s): LABPT, INR in the last 72 hours.  Neurologically intact  Assessment/Plan: 3 Days Post-Op Procedure(s) (LRB): INTRAMEDULLARY (IM) NAIL FEMORAL (Left) Up with therapy     No bowel moveent after supp.     EOC ordered.   Ormond Lazo C 06/21/2015, 8:51 AM

## 2015-06-21 NOTE — Progress Notes (Signed)
Echocardiogram 2D Echocardiogram has been performed.  Dorothey BasemanReel, Shermaine Rivet M 06/21/2015, 3:39 PM

## 2015-06-21 NOTE — Care Management Note (Signed)
Case Management Note  Patient Details  Name: Jeff Ferrell MRN: [id number] Date of Birth: 12/10/1950  Subjective/Objective:    65 yr old male admitted with a left femur fracture, patient underwent a left hip IM Nailing.                 Action/Plan : Dr. Arbutus Leasat informed  Case manager that patient is refusing SNF at this time and wants to go home with home health. Case manager spoke with patient concerning home health and DME needs. Choice was offered, patient states he has used home health previously. [facility]. Patient states he has rolling walker and 3in1. States his son will stop by to check on him, He states he doesn't need 24/7 help he is able to care for himself.     Expected Discharge Date:  06/22/15               Expected Discharge Plan:  Home w Home Health Services  In-House Referral:  Clinical Social Work  Discharge planning Services  CM Consult  Post Acute Care Choice:  Home Health Choice offered to:  Patient  DME Arranged:  N/A (Patient states he has a rolling walker and 3in1 ) DME Agency:  [facility] (Has used [facility] in Past. )  HH Arranged:  PT, OT, Nurse's Aide HH Agency:  [facility] (Has used [facility] in Past)  Status of Service:  Completed, signed off  Medicare Important Message Given:  Yes Date Medicare IM Given:    Medicare IM give by:    Date Additional Medicare IM Given:    Additional Medicare Important Message give by:     If discussed at Long Length of Stay Meetings, dates discussed:    Additional Comments:  Durenda GuthrieBrady, Chrislynn Mosely Naomi, RN 06/21/2015, 1:58 PM

## 2015-06-21 NOTE — Progress Notes (Signed)
Initial Nutrition Assessment   INTERVENTION:  Provide Glucerna Shake po TID, each supplement provides 220 kcal and 10 grams of protein Encourage PO intake   NUTRITION DIAGNOSIS:   Inadequate oral intake related to acute illness, nausea, poor appetite as evidenced by meal completion < 50%.   GOAL:   Patient will meet greater than or equal to 90% of their needs   MONITOR:   PO intake, Supplement acceptance, Weight trends, Labs, Skin  REASON FOR ASSESSMENT:   Consult Assessment of nutrition requirement/status  ASSESSMENT:   65 yr old male admitted through ED with L hip pain, weakness, hx of falls and inability to ambulate at home. Pt wtih recent hx of L great toe amp 03/30/15 and recent fall with multiple rib fxs. Pt with hx of T2DM and significant back surgery and chronic back pain. Continued workup revealed left hip fx. Underwent IM Nail on 06/18/15.   Per nursing notes, pt is eating 25% of most meals, 100% of some. Pt reports having a varied appetite due to occasional nausea and feeling weak. He reports usual weight of 184 lbs; he thinks he has lost weight, but is unsure how much. Per chart, pt has constipation with last BM on 12/23. RD emphasized the importance nutrition on healing and recovery. Pt agreeable to drinking Glucerna Shakes.  Labs: low hemoglobin, low sodium, low calcium  Diet Order:  Diet Carb Modified Fluid consistency:: Thin; Room service appropriate?: Yes  Skin:  Wound (see comment) (closed incision on left hip)  Last BM:  12/23  Height:   Ht Readings from Last 1 Encounters:  06/11/15 5\' 9"  (1.753 m)    Weight:   Wt Readings from Last 1 Encounters:  06/11/15 184 lb (83.462 kg)    Ideal Body Weight:  72.7 kg  BMI:  Body mass index is 27.16 kg/(m^2).  Estimated Nutritional Needs:   Kcal:  2000-2200  Protein:  90-100 grams  Fluid:  2-2.2 L/day  EDUCATION NEEDS:   No education needs identified at this time  Dorothea Ogleeanne Alydia Gosser RD,  LDN Inpatient Clinical Dietitian Pager: [phone] After Hours Pager: [phone]

## 2015-06-21 NOTE — Progress Notes (Signed)
Physical Therapy Treatment Patient Details Name: Jeff Ferrell MRN: [id number] DOB: May 27, 1951 Today's Date: 06/21/2015    History of Present Illness 65 yr old male admitted through ED with L hip pain, weakness, hx of falls and inability to ambulate at home.  Pt wtih recent hx of L great toe amp 03/30/15 and recent fall with multiple rib fxs.  Pt with hx significant back surgery and chronic back pain.  Continued workup revealed left hip fx.  Underwent IM Nail on 06/18/15.      PT Comments    Patient required less assistance for bed mobility this session with mod A +1. Stood with Stedy and max A +2 for ~1 min. Pt limited by pain due to spasms in L LE. Continue to recommend SNF for ongoing Physical Therapy.     Follow Up Recommendations  SNF     Equipment Recommendations  Other (comment)    Recommendations for Other Services       Precautions / Restrictions Precautions Precautions: Fall Restrictions Weight Bearing Restrictions: Yes LLE Weight Bearing: Weight bearing as tolerated    Mobility  Bed Mobility Overal bed mobility: Needs Assistance Bed Mobility: Sit to Supine;Supine to Sit;Rolling Rolling: Min guard;Min assist (vc for hand placement and sequencing )   Supine to sit: Mod assist;HOB elevated Sit to supine: +2 for physical assistance;Total assist   General bed mobility comments: mod A to elevate trunk and bring L LE to EOB; use of bed pad to elvate trunk into sitting; verband and tactile cues for hand placment and sequencing  Transfers Overall transfer level: Needs assistance   Transfers: Sit to/from Stand Sit to Stand: +2 physical assistance;Max assist         General transfer comment: demonstration and max vc for techniqe and hand placement for use of stedy to perform sit to stand; physical assist to attempt upright posture but unable to achieve with use of bed pad to lift buttocks; pt maitained bilat knee flexion throughout transfer; stood for ~1 min before  needing to return pt to bed for hygiene  Ambulation/Gait                 Stairs            Wheelchair Mobility    Modified Rankin (Stroke Patients Only)       Balance Overall balance assessment: Needs assistance Sitting-balance support: Bilateral upper extremity supported;Feet supported Sitting balance-Leahy Scale: Fair     Standing balance support: Bilateral upper extremity supported Standing balance-Leahy Scale: Zero                      Cognition Arousal/Alertness: Awake/alert Behavior During Therapy: Flat affect;WFL for tasks assessed/performed Overall Cognitive Status: Impaired/Different from baseline Area of Impairment: Awareness           Awareness: Intellectual        Exercises      General Comments        Pertinent Vitals/Pain Pain Assessment: Faces Faces Pain Scale: Hurts whole lot Pain Location: anterior L hip; with movement Pain Descriptors / Indicators: Spasm;Sore Pain Intervention(s): Limited activity within patient's tolerance;Monitored during session;Premedicated before session;Repositioned;Utilized relaxation techniques    Home Living                      Prior Function            PT Goals (current goals can now be found in the care plan section) Acute Rehab PT Goals  Patient Stated Goal: none stated PT Goal Formulation: With patient Time For Goal Achievement: 06/26/15 Potential to Achieve Goals: Fair Progress towards PT goals: Progressing toward goals    Frequency  Min 3X/week    PT Plan Current plan remains appropriate    Co-evaluation             End of Session Equipment Utilized During Treatment: Gait belt;Other (comment) Antony Salmon(Stedy) Activity Tolerance: Patient tolerated treatment well Patient left: in bed;with call bell/phone within reach;with nursing/sitter in room     Time: [id number] PT Time Calculation (min) (ACUTE ONLY): 27 min  Charges:  $Therapeutic Activity: 23-37 mins                     G Codes:      Derek MoundKellyn R Miyana Mordecai [place], PTA Pager: [phone]   06/21/2015, 1:01 PM

## 2015-06-21 NOTE — Progress Notes (Signed)
Per report from dayshift RN, patient has had 3 loose bowel movements today 06/21/15. Dr. Arbutus Leasat was made aware and was going to place an order for c-diff precautions. No order was placed and called on call [facility] for an order. Awaiting a response from [facility]. Will continue to monitor

## 2015-06-21 NOTE — Progress Notes (Signed)
PROGRESS NOTE  Jeff Ferrell ZOX:[id number] DOB: 1950-08-31 DOA: 06/11/2015 PCP: [name], MD 65 year old male with a history of extensive back surgery in 2011 with lumbar fusion of L2-S1 PLIF with hardware multilevel laminectomies at the [place] in [place]. He is presenting with 2 day complaint of back discomfort. He has had back pain for years but states that within last 2 days his pain medication regimen at home has not been helping him. Nothing he is aware of makes it better. Activity makes it worse. He denies any fevers. Given persistence in symptoms patient presented to the hospital for further evaluation recommendations.  In the ED patient was evaluated by ID physician who did not recommend any further workup for patient's back. Given intractable back pain and urinary tract infection we were consulted for further evaluation recommendations. The patient continued to have left hip pain. MRI of the left hip revealed a new femoral neck fracture on the left. Orthopedics was consulted. Surgical repair was performed on 06/18/2015. Assessment/Plan: Left femoral neck and patch and fracture with severe left hip pain/spasms -Continue pain control, oxycodone, mobile, Neurontin, Dilaudid, fentanyl patch -Orthopedic surgery consulted appreciated -MRI showed new left lower femoral neck impaction fracture along the medial cortex without displacement -Surgical repair 06/18/2015 -Pending PT/OT-->recommends SNF -Follow up with Dr. Roda Shutters in 2 weeks for staple removal. WBAT.  -DVT prophylaxis per ortho  Bacteruria -Patient on Rocephin however held as urine cultures were not sent -UA on 06/11/2015 showed WBC 6-30, many bacteria, small leukocytes, positive nitrites -Urine cultures showed multiple species -remain off abx--remains afebrile and hemodynamically stable  Urinary retention -Patient had bladder scan 1300cc noted, foley placed -Started on flomax -foley taken out 06/20/15--still with urine  retention -repeat bladder scan--likely needs foley upon d/c  Chronic lower back pain/spasms -Patient has had a history of prior back surgeries -? Discitis noted on MRI -No further imaging recommended by ID (as noted on previous hospitalist note)  Diabetes mellitus, type II -Continue insulin sliding scale CBG monitoring -Patient did have a hypoglycemic episode however this resolved after his 70/30 was discontinued-->CBGs remained controlled  Constipation -likely complicated by pain meds -Will add miralax , continue Reglan PRN -add bisacodyl supp  Anemia/Acute blood loss -secondary to surgery and dilution from IVF -Hb 9.5, baseline 13 -continue to monitor CBC, transfuse one unit PRBC due to persistent tachycardia  Leukocytosis  -likely secondary to surgery, stress demargination -improved without abx -continue to monitor CBC  Hyponatremia -Likely due to iron depletion from poor po intake -give 1 L NS  Sinus tachycardia -May be partly due to pain and ABLA -Hemoglobin trending down partly due to dilution -Transfuse 1 unit PRBC -Echocardiogram -Start low-dose metoprolol tartrate  Code Status: Full  Family Communication: spoke with sister on phone--total time 35 min  Disposition Plan: currently refusing SNF--discussed with sister again 06/21/15 Plan d/c home 06/22/15       Procedures/Studies: Mr Lumbar Spine W Wo Contrast  06/11/2015  CLINICAL DATA:  Severe low back pain radiating to the left leg EXAM: MRI LUMBAR SPINE WITHOUT AND WITH CONTRAST TECHNIQUE: Multiplanar and multiecho pulse sequences of the lumbar spine were obtained without and with intravenous contrast. CONTRAST:  17mL MULTIHANCE GADOBENATE DIMEGLUMINE 529 MG/ML IV SOLN COMPARISON:  None. FINDINGS: Severe L5 vertebral body height loss. The vertebral bodies of the lumbar spine are normal in alignment. There is normal bone marrow signal demonstrated throughout the vertebra. Posterior lumbar fusion from L2  through S1. Severe  L4-5 disc space narrowing. There is increased T2 signal within the L5-S1 disc space with mild disc enhancement. There is no paravertebral fluid collection or edema. The spinal cord is normal in signal and contour. The cord terminates normally at T12 . The nerve roots of the cauda equina and the filum terminale are normal. The visualized portions of the SI joints are unremarkable. The imaged intra-abdominal contents are unremarkable. T12-L1: No significant disc bulge. No evidence of neural foraminal stenosis. No central canal stenosis. L1-L2: Mild broad-based disc bulge. No evidence of neural foraminal stenosis. Mild bilateral facet arthropathy. Mild spinal stenosis. L2-L3: No significant disc bulge. No evidence of neural foraminal stenosis. No central canal stenosis. L3-L4: Limited evaluation secondary to susceptibility artifact. No significant disc bulge. No evidence of neural foraminal stenosis. No central canal stenosis. L4-L5: Limited evaluation secondary to susceptibility artifact. Severe disc height loss. No evidence of neural foraminal stenosis. No central canal stenosis. L5-S1: Increased T2 signal within the L5-S1 disc space with mild disc enhancement. Mild bilateral foraminal narrowing. No central canal stenosis. IMPRESSION: 1. Increased T2 signal within the L5-S1 disc space with mild disc enhancement. There is no adjacent marrow signal abnormality. There is no paravertebral soft tissue abnormality. This may reflect early discitis versus postsurgical changes. 2. Posterior lumbar fusion from L2 through S1. 3. Severe disc height loss at L4-5 and L5 vertebral body height loss which may be secondary to prior discitis or prior trauma. Electronically Signed   By: Elige Ko   On: 06/11/2015 12:34   Mr Hip Left W Wo Contrast  06/17/2015  CLINICAL DATA:  Severe left hip pain since a recent fall. EXAM: MRI OF THE LEFT HIP WITHOUT AND WITH CONTRAST TECHNIQUE: Multiplanar, multisequence MR  imaging was performed both before and after administration of intravenous contrast. CONTRAST:  17mL MULTIHANCE GADOBENATE DIMEGLUMINE 529 MG/ML IV SOLN COMPARISON:  Radiographs dated 06/11/2015 FINDINGS: Bones: There is a new left low femoral neck fracture with impaction along the medial aspect with a nondisplaced hairline fracture extending superiorly into the greater trochanter. There is edema in the surrounding musculature, particularly the distal left iliopsoas muscle and in the proximal left vastus intermedius muscle as well some edema in the left adductor muscles. There is a small fluid collection adjacent to the left lesser trochanter. There is surprisingly little edema in the femur at the site of the fracture. There is only slight enhancement at the fracture site after contrast administration. No other acute abnormality of the bones of the pelvis or hips. Previous lumbosacral fusion. The bladder is markedly distended to at least the level of the umbilicus. IMPRESSION: 1. New left low femoral neck impaction fracture along the medial cortex without displacement. This was not visible on the prior radiographs. 2. Edema in the adjacent musculature as described. 3. Marked distention of the bladder. Electronically Signed   By: Francene Boyers M.D.   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 Irregularity and increased sclerosis is noted just below the head of the second metatarsal consistent with subacute injury. No fracture is identified. No bony erosion to suggest osteomyelitis is seen. IMPRESSION: Postoperative change. Changes consistent with a subacute fracture in the distal second metatarsal. No other focal abnormality is seen. Electronically Signed  By: Alcide CleverMark   Lukens M.D.   On: 06/11/2015 10:05   Dg Hip Operative Unilat With Pelvis Left  06/18/2015  CLINICAL DATA:  Left femur IM nail EXAM: OPERATIVE LEFT HIP (WITH PELVIS IF PERFORMED) 2 VIEWS TECHNIQUE: Fluoroscopic spot image(s) were submitted for interpretation post-operatively. COMPARISON:  06/11/2015 MRI 06/16/2015 FINDINGS: Placement of dynamic hip screws and intra medullary nail seen on these images over the left hip. No hardware or bony complicating feature. IMPRESSION: Internal fixation across the the left femoral neck fracture. Electronically Signed   By: Charlett NoseKevin  Dover M.D.   On: 06/18/2015 13:13   Dg Hip Unilat With Pelvis 2-3 Views Left  06/11/2015  CLINICAL DATA:  Pain following fall EXAM: DG HIP (WITH OR WITHOUT PELVIS) 2-3V LEFT COMPARISON:  November 30, 2006 FINDINGS: Frontal pelvis as well as frontal and lateral left hip images were obtained. There is slight symmetric narrowing of both hip joints. No fracture or dislocation. No erosive change. There is extensive postoperative change in the lower lumbar spine and upper sacral regions. IMPRESSION: No acute fracture or dislocation. Mild symmetric narrowing both hip joints. Extensive postoperative change in the visualized lower lumbar spine and upper sacral regions. Electronically Signed   By: Bretta BangWilliam  Woodruff III M.D.   On: 06/11/2015 09:45         Subjective: Oral, patient states the pain is under control. Denies any fevers, chills, chest pain, shortness breath, nausea, vomiting, diarrhea,, PA. No dizziness or headache.  Objective: Filed Vitals:   06/20/15 0448 06/20/15 1500 06/20/15 2003 06/21/15 0501  BP: 126/64 93/48 103/56 139/69  Pulse: 117 110 108 117  Temp: 98.7 F (37.1 C) 98.7 F (37.1 C) 98.7 F (37.1 C) 99.2 F (37.3 C)  TempSrc: Oral Oral Oral Oral  Resp: 18 18 18 16   Height:      Weight:      SpO2: 91% 87% 91% 100%    Intake/Output Summary (Last 24 hours) at 06/21/15 1328 Last data filed at 06/21/15 0520  Gross  per 24 hour  Intake    720 ml  Output    800 ml  Net    -80 ml   Weight change:  Exam:   General:  Pt is alert, follows commands appropriately, not in acute distress  HEENT: No icterus, No thrush, No neck mass, [place]ph[place]/AT  Cardiovascular: RRR, S1/S2, no rubs, no gallops  Respiratory: CTA bilaterally, no wheezing, no crackles, no rhonchi  Abdomen: Soft/+BS, non tender, non distended, no guarding  Extremities: No edema, No lymphangitis, No petechiae, No rashes, no synovitis  Data Reviewed: Basic Metabolic Panel:  Recent Labs Lab 06/16/15 0445 06/18/15 1535 06/19/15 0647 06/20/15 0530 06/21/15 0402  NA 132*  --  129* 130* 130*  K 4.1  --  4.7 4.6 4.3  CL 95*  --  94* 93* 94*  CO2 27  --  26 29 29   GLUCOSE 97  --  170* 153* 134*  BUN 12  --  9 7 7   CREATININE 0.68 0.74 0.68 0.71 0.60*  CALCIUM 9.4  --  8.7* 8.5* 8.4*   Liver Function Tests: No results for input(s): AST, ALT, ALKPHOS, BILITOT, PROT, ALBUMIN in the last 168 hours. No results for input(s): LIPASE, AMYLASE in the last 168 hours. No results for input(s): AMMONIA in the last 168 hours. CBC:  Recent Labs Lab 06/16/15 0445 06/18/15 1535 06/19/15 0647 06/20/15 0530 06/21/15 0402  WBC 7.8 7.1 12.0* 10.3 7.7  HGB 12.7* 11.3* 9.5* 9.0* 8.2*  HCT 37.9* 33.2* 29.2* 26.1* 24.8*  MCV 89.4 88.1 88.5 87.9 87.0  PLT 268 277 256 258 295   Cardiac Enzymes: No results for input(s): CKTOTAL, CKMB, CKMBINDEX, TROPONINI in the last 168 hours. BNP: Invalid input(s): POCBNP CBG:  Recent Labs Lab 06/20/15 1201 06/20/15 1608 06/20/15 2000 06/21/15 0636 06/21/15 1159  GLUCAP 123* 137* 99 124* 156*    Recent Results (from the past 240 hour(s))  Urine culture     Status: None   Collection Time: 06/16/15  4:39 PM  Result Value Ref Range Status   Specimen Description URINE, CLEAN CATCH  Final   Special Requests Normal  Final   Culture   Final    MULTIPLE SPECIES PRESENT, SUGGEST RECOLLECTION Performed at [facility]    Report Status 06/18/2015 FINAL  Final  Surgical pcr screen     Status: Abnormal   Collection Time: 06/17/15 11:59 PM  Result Value Ref Range Status   MRSA, PCR POSITIVE (A) NEGATIVE Final    Comment: RESULT CALLED TO, READ BACK BY AND VERIFIED WITH: R SNIDERMAN @0301  06/18/15 MKELLY    Staphylococcus aureus POSITIVE (A) NEGATIVE Final    Comment:        The Xpert SA Assay (FDA approved for NASAL specimens in patients over 43 years of age), is one component of a comprehensive surveillance program.  Test performance has been validated by [facility] for patients greater than or equal to 2 year old. It is not intended to diagnose infection nor to guide or monitor treatment.   Culture, Urine     Status: None   Collection Time: 06/19/15  4:00 PM  Result Value Ref Range Status   Specimen Description URINE, CATHETERIZED  Final   Special Requests NONE  Final   Culture NO GROWTH 2 DAYS  Final   Report Status 06/21/2015 FINAL  Final     Scheduled Meds: . Chlorhexidine Gluconate Cloth  6 each Topical Q0600  . enoxaparin (LOVENOX) injection  40 mg Subcutaneous Q24H  . fentaNYL  50 mcg Transdermal Q72H  . gabapentin  300 mg Oral QID  . insulin aspart  0-15 Units Subcutaneous TID WC  . meloxicam  7.5 mg Oral Daily  . metoprolol tartrate  12.5 mg Oral BID  . mupirocin ointment  1 application Nasal BID  . polyethylene glycol  17 g Oral Daily  . senna  2 tablet Oral Daily  . sodium chloride  3 mL Intravenous Q12H  . tamsulosin  0.4 mg Oral Daily   Continuous Infusions:    Constance Hackenberg, DO  [facility] Pager [phone]  If 7PM-7AM, please contact night-coverage [email or web address] Password [email or web address] 06/21/2015, 1:28 PM   LOS: 8 days

## 2015-06-21 NOTE — Clinical Social Work Note (Addendum)
CSW spoke with patient and he does not want to go to a SNF, patient would like to go home with home health.  CSW notified case manager and physician, CSW to sign off, please reconsult if other social work needs arise.  Ervin KnackEric R. Antoneo Ghrist, MSW, Theresia MajorsLCSWA [phone] 06/21/2015 3:18 PM

## 2015-06-22 ENCOUNTER — Encounter (HOSPITAL_COMMUNITY): Payer: Self-pay | Admitting: Orthopaedic Surgery

## 2015-06-22 LAB — GLUCOSE, CAPILLARY
GLUCOSE-CAPILLARY: 125 mg/dL — AB (ref 65–99)
GLUCOSE-CAPILLARY: 110 mg/dL — AB (ref 65–99)
GLUCOSE-CAPILLARY: 130 mg/dL — AB (ref 65–99)
GLUCOSE-CAPILLARY: 117 mg/dL — AB (ref 65–99)

## 2015-06-22 LAB — CBC
HCT: 24.2 % — ABNORMAL LOW (ref 39.0–52.0)
HEMOGLOBIN: 8.1 g/dL — AB (ref 13.0–17.0)
MCH: 29.1 pg (ref 26.0–34.0)
MCHC: 33.5 g/dL (ref 30.0–36.0)
MCV: 87.1 fL (ref 78.0–100.0)
Platelets: 371 10*3/uL (ref 150–400)
RBC: 2.78 MIL/uL — AB (ref 4.22–5.81)
RDW: 12.1 % (ref 11.5–15.5)
WBC: 6.6 10*3/uL (ref 4.0–10.5)

## 2015-06-22 LAB — BASIC METABOLIC PANEL
Anion gap: 6 (ref 5–15)
BUN: 6 mg/dL (ref 6–20)
CHLORIDE: 97 mmol/L — AB (ref 101–111)
CO2: 30 mmol/L (ref 22–32)
CREATININE: 0.74 mg/dL (ref 0.61–1.24)
Calcium: 8.5 mg/dL — ABNORMAL LOW (ref 8.9–10.3)
GFR calc Af Amer: >60 mL/min (ref >60)
GFR calc non Af Amer: >60 mL/min (ref >60)
Glucose, Bld: 124 mg/dL — ABNORMAL HIGH (ref 65–99)
POTASSIUM: 4.7 mmol/L (ref 3.5–5.1)
Sodium: 133 mmol/L — ABNORMAL LOW (ref 135–145)

## 2015-06-22 LAB — PREPARE RBC (CROSSMATCH)

## 2015-06-22 LAB — C DIFFICILE QUICK SCREEN W PCR REFLEX
C DIFFICILE (CDIFF) TOXIN: NEGATIVE
C Diff antigen: NEGATIVE
C Diff interpretation: NEGATIVE

## 2015-06-22 MED ORDER — SODIUM CHLORIDE 0.9 % IV SOLN: Freq: Once | INTRAVENOUS | Status: DC

## 2015-06-22 NOTE — Progress Notes (Signed)
Dr Tat ordered 1 unit of blood for pt hgb 8.1.talked with pt. Went in to get permit signed pt refused to get blood thought Dr meant he was going to give blood for bllod work.

## 2015-06-22 NOTE — Clinical Social Work Note (Signed)
Patient now agreeable to SNF placement however seems to be confused. CSW spoke with pt's son, Eino Farberhillip Lenn via telephone who is agreeable to SNF placement as well. Patient is concerned with paying his rent before going to SNF. Pt's [facility] around 10 am to assist with discharge planning. Pt's son requesting to transport patient. CSW shared with son that it requires 3 staff members to assist patient out of bed and that it is preferred that patient go by [facility]. Son agreed.   Patient and son will make bed decision in the morning, 1/4. Patient has bed offers. CSW remains available as needed.   Derenda FennelBashira Willadean Guyton, MSW, LCSWA [phone] 06/22/2015 5:25 PM

## 2015-06-22 NOTE — Progress Notes (Signed)
PROGRESS NOTE  Jeff Ferrell ZOX:[id number] DOB: 10/28/1950 DOA: 06/11/2015 PCP: [name], MD  Assessment/Plan: Left femoral neck and patch and fracture with severe left hip pain/spasms -Continue pain control, oxycodone, mobile, Neurontin, Dilaudid, fentanyl patch -Orthopedic surgery consulted appreciated -MRI showed new left lower femoral neck impaction fracture along the medial cortex without displacement -Surgical repair 06/18/2015 -Pending PT/OT-->recommends SNF--pt finally agree on 07/02/15 -Follow up with Dr. Roda Shutters in 2 weeks for staple removal. WBAT.  -Physical therapy consulted--recommended SNF--patient refused after multiple discussions--patient finally agrees to SNF on 06/22/15 -DVT prophylaxis per ortho  Bacteruria -Patient on Rocephin however held as urine cultures were not sent -UA on 06/11/2015 showed WBC 6-30, many bacteria, small leukocytes, positive nitrites -Urine cultures showed multiple species -remain off abx--remains afebrile and hemodynamically stable  Urinary retention -Patient had bladder scan 1300cc noted, foley placed -Started on flomax -foley taken out 06/20/15--still with urine retention -replaced foley 06/21/15  Chronic lower back pain/spasms -Patient has had a history of prior back surgeries -? Discitis noted on MRI -No further imaging recommended by ID (as noted on previous hospitalist note)  Diabetes mellitus, type II -Continue insulin sliding scale CBG monitoring -Patient did have a hypoglycemic episode however this resolved after his 70/30 was discontinued-->CBGs remained controlled  Constipation-->diarrhea -likely complicated by pain meds -Will add miralax , continue Reglan PRN -improved -C. Diff negative  Anemia/Acute blood loss -secondary to surgery and dilution from IVF -Hb 9.5, baseline 13 -continue to monitor CBC, transfuse one unit PRBC due to persistent tachycardia-->pt refused after discussion of risks, benefits,  alternatives  Leukocytosis  -likely secondary to surgery, stress demargination -improved without abx -continue to monitor CBC  Hyponatremia -Likely due to iron depletion from poor po intake -give 1 L NS  Sinus tachycardia -May be partly due to pain and ABLA -Hemoglobin trending down partly due to dilution -Transfuse 1 unit PRBC--pt refused transfusion x 2 -Echocardiogram--EF 65-70%, grade 1 diastolic dysfunction, no wall motion abnormalities, no major valvular abnormalities -Started low-dose metoprolol tartrate   Family Communication:  Son updated on phone Disposition Plan:   SNF on 06/23/15       Procedures/Studies: Mr Lumbar Spine W Wo Contrast  06/11/2015  CLINICAL DATA:  Severe low back pain radiating to the left leg EXAM: MRI LUMBAR SPINE WITHOUT AND WITH CONTRAST TECHNIQUE: Multiplanar and multiecho pulse sequences of the lumbar spine were obtained without and with intravenous contrast. CONTRAST:  17mL MULTIHANCE GADOBENATE DIMEGLUMINE 529 MG/ML IV SOLN COMPARISON:  None. FINDINGS: Severe L5 vertebral body height loss. The vertebral bodies of the lumbar spine are normal in alignment. There is normal bone marrow signal demonstrated throughout the vertebra. Posterior lumbar fusion from L2 through S1. Severe L4-5 disc space narrowing. There is increased T2 signal within the L5-S1 disc space with mild disc enhancement. There is no paravertebral fluid collection or edema. The spinal cord is normal in signal and contour. The cord terminates normally at T12 . The nerve roots of the cauda equina and the filum terminale are normal. The visualized portions of the SI joints are unremarkable. The imaged intra-abdominal contents are unremarkable. T12-L1: No significant disc bulge. No evidence of neural foraminal stenosis. No central canal stenosis. L1-L2: Mild broad-based disc bulge. No evidence of neural foraminal stenosis. Mild bilateral facet arthropathy. Mild spinal stenosis. L2-L3: No  significant disc bulge. No evidence of neural foraminal stenosis. No central canal stenosis. L3-L4: Limited evaluation secondary to susceptibility artifact. No significant disc bulge.  No evidence of neural foraminal stenosis. No central canal stenosis. L4-L5: Limited evaluation secondary to susceptibility artifact. Severe disc height loss. No evidence of neural foraminal stenosis. No central canal stenosis. L5-S1: Increased T2 signal within the L5-S1 disc space with mild disc enhancement. Mild bilateral foraminal narrowing. No central canal stenosis. IMPRESSION: 1. Increased T2 signal within the L5-S1 disc space with mild disc enhancement. There is no adjacent marrow signal abnormality. There is no paravertebral soft tissue abnormality. This may reflect early discitis versus postsurgical changes. 2. Posterior lumbar fusion from L2 through S1. 3. Severe disc height loss at L4-5 and L5 vertebral body height loss which may be secondary to prior discitis or prior trauma. Electronically Signed   By: Elige Ko   On: 06/11/2015 12:34   Mr Hip Left W Wo Contrast  06/17/2015  CLINICAL DATA:  Severe left hip pain since a recent fall. EXAM: MRI OF THE LEFT HIP WITHOUT AND WITH CONTRAST TECHNIQUE: Multiplanar, multisequence MR imaging was performed both before and after administration of intravenous contrast. CONTRAST:  17mL MULTIHANCE GADOBENATE DIMEGLUMINE 529 MG/ML IV SOLN COMPARISON:  Radiographs dated 06/11/2015 FINDINGS: Bones: There is a new left low femoral neck fracture with impaction along the medial aspect with a nondisplaced hairline fracture extending superiorly into the greater trochanter. There is edema in the surrounding musculature, particularly the distal left iliopsoas muscle and in the proximal left vastus intermedius muscle as well some edema in the left adductor muscles. There is a small fluid collection adjacent to the left lesser trochanter. There is surprisingly little edema in the femur at the  site of the fracture. There is only slight enhancement at the fracture site after contrast administration. No other acute abnormality of the bones of the pelvis or hips. Previous lumbosacral fusion. The bladder is markedly distended to at least the level of the umbilicus. IMPRESSION: 1. New left low femoral neck impaction fracture along the medial cortex without displacement. This was not visible on the prior radiographs. 2. Edema in the adjacent musculature as described. 3. Marked distention of the bladder. Electronically Signed   By: Francene Boyers M.D.   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 Irregularity and increased sclerosis is noted just below the head of the second metatarsal consistent with subacute injury. No fracture is identified. No bony erosion to suggest osteomyelitis is seen. IMPRESSION: Postoperative change. Changes consistent with a subacute fracture in the distal second metatarsal. No other focal abnormality is seen. Electronically Signed   By: Alcide Clever M.D.   On: 06/11/2015 10:05   Dg Hip Operative Unilat With Pelvis Left  06/18/2015  CLINICAL DATA:  Left femur IM nail EXAM: OPERATIVE LEFT HIP (WITH PELVIS IF PERFORMED) 2 VIEWS TECHNIQUE: Fluoroscopic spot image(s) were submitted for interpretation post-operatively. COMPARISON:  06/11/2015 MRI 06/16/2015 FINDINGS: Placement of dynamic hip screws and intra medullary nail seen on these images over the left hip. No hardware or bony complicating feature. IMPRESSION: Internal fixation across the the left femoral neck fracture. Electronically Signed   By: Charlett Nose M.D.   On: 06/18/2015 13:13   Dg Hip Unilat With Pelvis 2-3 Views Left  06/11/2015  CLINICAL DATA:  Pain following fall  EXAM: DG HIP (WITH OR WITHOUT PELVIS)  2-3V LEFT COMPARISON:  November 30, 2006 FINDINGS: Frontal pelvis as well as frontal and lateral left hip images were obtained. There is slight symmetric narrowing of both hip joints. No fracture or dislocation. No erosive change. There is extensive postoperative change in the lower lumbar spine and upper sacral regions. IMPRESSION: No acute fracture or dislocation. Mild symmetric narrowing both hip joints. Extensive postoperative change in the visualized lower lumbar spine and upper sacral regions. Electronically Signed   By: Bretta Bang III M.D.   On: 06/11/2015 09:45         Subjective: Patient denies fevers, chills, headache, chest pain, dyspnea, nausea, vomiting, diarrhea, abdominal pain, dysuria, hematuria   Objective: Filed Vitals:   06/21/15 2252 06/22/15 0406 06/22/15 1053 06/22/15 1638  BP: 106/56 113/56 113/56 86/51  Pulse: 120 106  105  Temp:  98.4 F (36.9 C)  98.2 F (36.8 C)  TempSrc:  Oral    Resp: 20 18  18   Height:      Weight:      SpO2:  92%  93%    Intake/Output Summary (Last 24 hours) at 06/22/15 1826 Last data filed at 06/22/15 1640  Gross per 24 hour  Intake    480 ml  Output   2275 ml  Net  -1795 ml   Weight change:  Exam:   General:  Pt is alert, follows commands appropriately, not in acute distress  HEENT: No icterus, No thrush, No neck mass, [place]/AT  Cardiovascular: RRR, S1/S2, no rubs, no gallops  Respiratory: Bibasilar crackles without wheezing. Good air movement  Abdomen: Soft/+BS, non tender, non distended, no guarding  Extremities: No edema, No lymphangitis, No petechiae, No rashes, no synovitis  Data Reviewed: Basic Metabolic Panel:  Recent Labs Lab 06/16/15 0445 06/18/15 1535 06/19/15 0647 06/20/15 0530 06/21/15 0402 06/22/15 0516  NA 132*  --  129* 130* 130* 133*  K 4.1  --  4.7 4.6 4.3 4.7  CL 95*  --  94* 93* 94* 97*  CO2 27  --  26 29 29 30   GLUCOSE 97  --  170* 153* 134* 124*   BUN 12  --  9 7 7 6   CREATININE 0.68 0.74 0.68 0.71 0.60* 0.74  CALCIUM 9.4  --  8.7* 8.5* 8.4* 8.5*   Liver Function Tests: No results for input(s): AST, ALT, ALKPHOS, BILITOT, PROT, ALBUMIN in the last 168 hours. No results for input(s): LIPASE, AMYLASE in the last 168 hours. No results for input(s): AMMONIA in the last 168 hours. CBC:  Recent Labs Lab 06/18/15 1535 06/19/15 0647 06/20/15 0530 06/21/15 0402 06/22/15 0516  WBC 7.1 12.0* 10.3 7.7 6.6  HGB 11.3* 9.5* 9.0* 8.2* 8.1*  HCT 33.2* 29.2* 26.1* 24.8* 24.2*  MCV 88.1 88.5 87.9 87.0 87.1  PLT 277 256 258 295 371   Cardiac Enzymes: No results for input(s): CKTOTAL, CKMB, CKMBINDEX, TROPONINI in the last 168 hours. BNP: Invalid input(s): POCBNP CBG:  Recent Labs Lab 06/21/15 1712 06/21/15 2133 06/22/15 0641 06/22/15 1118 06/22/15 1633  GLUCAP 98 101* 125* 110* 130*    Recent Results (from the past 240 hour(s))  Urine culture     Status: None   Collection Time: 06/16/15  4:39 PM  Result Value Ref Range Status   Specimen Description URINE, CLEAN CATCH  Final   Special Requests Normal  Final   Culture   Final    MULTIPLE SPECIES PRESENT, SUGGEST RECOLLECTION Performed at [facility]    Report Status  06/18/2015 FINAL  Final  Surgical pcr screen     Status: Abnormal   Collection Time: 06/17/15 11:59 PM  Result Value Ref Range Status   MRSA, PCR POSITIVE (A) NEGATIVE Final    Comment: RESULT CALLED TO, READ BACK BY AND VERIFIED WITH: R SNIDERMAN @0301  06/18/15 MKELLY    Staphylococcus aureus POSITIVE (A) NEGATIVE Final    Comment:        The Xpert SA Assay (FDA approved for NASAL specimens in patients over 66 years of age), is one component of a comprehensive surveillance program.  Test performance has been validated by [facility] for patients greater than or equal to 66 year old. It is not intended to diagnose infection nor to guide or monitor treatment.   Culture, Urine     Status:  None   Collection Time: 06/19/15  4:00 PM  Result Value Ref Range Status   Specimen Description URINE, CATHETERIZED  Final   Special Requests NONE  Final   Culture NO GROWTH 2 DAYS  Final   Report Status 06/21/2015 FINAL  Final  C difficile quick scan w PCR reflex     Status: None   Collection Time: 06/22/15  6:41 AM  Result Value Ref Range Status   C Diff antigen NEGATIVE NEGATIVE Final   C Diff toxin NEGATIVE NEGATIVE Final   C Diff interpretation Negative for toxigenic C. difficile  Final     Scheduled Meds: . sodium chloride   Intravenous Once  . sodium chloride   Intravenous Once  . Chlorhexidine Gluconate Cloth  6 each Topical Q0600  . enoxaparin (LOVENOX) injection  40 mg Subcutaneous Q24H  . feeding supplement (GLUCERNA SHAKE)  237 mL Oral TID BM  . [START ON 06/23/2015] fentaNYL  25 mcg Transdermal Q72H  . gabapentin  300 mg Oral QID  . insulin aspart  0-15 Units Subcutaneous TID WC  . meloxicam  7.5 mg Oral Daily  . metoprolol tartrate  12.5 mg Oral BID  . sodium chloride  3 mL Intravenous Q12H  . tamsulosin  0.4 mg Oral Daily   Continuous Infusions:    Loralie Malta, DO  [facility] Pager [phone]  If 7PM-7AM, please contact night-coverage [email or web address] Password [email or web address] 06/22/2015, 6:26 PM   LOS: 9 days

## 2015-06-23 LAB — CBC
HEMATOCRIT: 24.8 % — AB (ref 39.0–52.0)
HEMOGLOBIN: 8.3 g/dL — AB (ref 13.0–17.0)
MCH: 29 pg (ref 26.0–34.0)
MCHC: 33.5 g/dL (ref 30.0–36.0)
MCV: 86.7 fL (ref 78.0–100.0)
Platelets: 399 10*3/uL (ref 150–400)
RBC: 2.86 MIL/uL — ABNORMAL LOW (ref 4.22–5.81)
RDW: 12.2 % (ref 11.5–15.5)
WBC: 6.1 10*3/uL (ref 4.0–10.5)

## 2015-06-23 LAB — GLUCOSE, CAPILLARY: Glucose-Capillary: 98 mg/dL (ref 65–99)

## 2015-06-23 MED ORDER — FENTANYL 25 MCG/HR TD PT72: 25.0000 ug | MEDICATED_PATCH | TRANSDERMAL | Status: DC

## 2015-06-23 MED ORDER — SITAGLIPTIN PHOSPHATE 50 MG PO TABS: 50.0000 mg | ORAL_TABLET | Freq: Every day | ORAL | Status: DC

## 2015-06-23 MED ORDER — SODIUM CHLORIDE 0.9 % IV BOLUS (SEPSIS)
1000.0000 mL | Freq: Once | INTRAVENOUS | Status: AC
  Administered 2015-06-23: 1000 mL via INTRAVENOUS

## 2015-06-23 NOTE — Discharge Summary (Addendum)
Physician Discharge Summary  Jeff Ferrell MWN:[id number] DOB: Dec 22, 1950 DOA: 06/11/2015  PCP: [name], MD  Admit date: 06/11/2015 Discharge date: 06/23/2015  Recommendations for Outpatient Follow-up:  1. Pt will need to follow up with PCP in 2 weeks post discharge 2. Please obtain BMP and CBC in one week 3. Remove foley catheter on 07/08/15 for voiding trial 4. Please check CBGs q ac/hs and restart 70/30 insulin if needed  Discharge Diagnoses:  Left femoral neck and patch and fracture with severe left hip pain/spasms -Continue pain control, oxycodone, mobile, Neurontin, Dilaudid, fentanyl patch -Orthopedic surgery consulted appreciated -MRI showed new left lower femoral neck impaction fracture along the medial cortex without displacement -Surgical repair 06/18/2015 -Pending PT/OT-->recommends SNF--pt finally agree on 07/02/15 -Follow up with Dr. Roda Shutters in 2 weeks for staple removal. WBAT.  -Physical therapy consulted--recommended SNF--patient refused after multiple discussions--patient finally agrees to SNF on 06/22/15 but on 06/23/15 changed his mind again--pt to be discharged home with [facility], PT/OT, SW -DVT prophylaxis per ortho--enoxaparin x 14 days  Bacteruria -Patient on Rocephin however held as urine cultures were not sent -UA on 06/11/2015 showed WBC 6-30, many bacteria, small leukocytes, positive nitrites -Urine cultures showed multiple species -remain off abx--remains afebrile and hemodynamically stable  Urinary retention -Patient had bladder scan 1300cc noted, foley placed -Started on flomax -foley taken out 06/20/15--still with urine retention -replaced foley 06/21/15  Chronic lower back pain/spasms -Patient has had a history of prior back surgeries -? Discitis noted on MRI -No further imaging recommended by ID (as noted on previous hospitalist note)  Diabetes mellitus, type II -Continue insulin sliding scale CBG monitoring -Patient did have a hypoglycemic episode  however this resolved after his 70/30 was discontinued-->CBGs remained controlled -The patient remained off his basal 70/30 insulin for the remainder of the hospitalization and he CBGs remained well-controlled  98-130 -After discharge would continue to monitor CBGs before meals/at bedtime and restart 70/30 insulin if needed -Discontinue glipizide -Start patient on Januvia after discharge  Constipation-->diarrhea -likely complicated by pain meds -Will add miralax , continue Reglan PRN -improved -C. Diff negative  Anemia/Acute blood loss -secondary to surgery and dilution from IVF -Hb 9.5, baseline 13 -continue to monitor CBC, transfuse one unit PRBC due to persistent tachycardia-->pt refused after discussion of risks, benefits, alternatives  Leukocytosis  -likely secondary to surgery, stress demargination -improved without abx -continue to monitor CBC  Hyponatremia -Likely due to iron depletion from poor po intake -given 1 L NS  Sinus tachycardia -May be partly due to pain and ABLA -Hemoglobin trending down partly due to dilution -Transfuse 1 unit PRBC--pt refused transfusion x 2 -Echocardiogram--EF 65-70%, grade 1 diastolic dysfunction, no wall motion abnormalities, no major valvular abnormalities -Started low-dose metoprolol tartrate--became mildly hypotensive with SBP in mid 80s-->discontinued -HR improving with fluid resuscitation  Discharge Condition: stable  Disposition: SNF Follow-up Information    Follow up with Cheral Almas, MD In 2 weeks.   Specialty:  Orthopedic Surgery   Why:  For suture removal, For wound re-check   Contact information:   [address] [place] [address] [phone]       Follow up with [facility].   Why:  Someone one from [facility] will contact you concerning start date and time for therapy.   Contact information:   [address] [place] [address] [phone]       Diet:  carb modified Wt Readings from Last 3 Encounters:  06/11/15 83.462 kg (184 lb)  06/16/15 83.462 kg (184 lb)  03/29/15 83.9 kg (184 lb 15.5 oz)    History of present illness:    Consultants: Ortho  Discharge Exam: Filed Vitals:   06/23/15 0646 06/23/15 0654  BP: 82/55 87/58  Pulse:    Temp:  98.4 F (36.9 C)  Resp:     Filed Vitals:   06/22/15 2148 06/23/15 0640 06/23/15 0646 06/23/15 0654  BP: 100/64 73/61 82/55  87/58  Pulse: 95 85    Temp: 99 F (37.2 C) 95.8 F (35.4 C)  98.4 F (36.9 C)  TempSrc: Oral Oral  Oral  Resp: 16     Height:      Weight:      SpO2: 98%      General: awake and alert, NAD, pleasant, cooperative Cardiovascular: RRR, no rub, no gallop, no S3 Respiratory: CTAB, no wheeze, no rhonchi Abdomen:soft, nontender, nondistended, positive bowel sounds Extremities: No edema, No lymphangitis, no petechiae  Discharge Instructions      Discharge Instructions    Diet - low sodium heart healthy    Complete by:  As directed      Increase activity slowly    Complete by:  As directed      Weight bearing as tolerated    Complete by:  As directed             Medication List    STOP taking these medications        ciprofloxacin 250 MG tablet  Commonly known as:  CIPRO     doxycycline 100 MG tablet  Commonly known as:  VIBRA-TABS     glipiZIDE 10 MG tablet  Commonly known as:  GLUCOTROL     insulin aspart protamine - aspart (70-30) 100 UNIT/ML FlexPen  Commonly known as:  NOVOLOG 70/30 MIX     insulin aspart protamine- aspart (70-30) 100 UNIT/ML injection  Commonly known as:  NOVOLOG MIX 70/30      TAKE these medications        enoxaparin 40 MG/0.4ML injection  Commonly known as:  LOVENOX  Inject 0.4 mLs (40 mg total) into the skin daily.     feeding supplement (GLUCERNA SHAKE) Liqd  Take 237 mLs by mouth 3 (three) times daily between meals.     fentaNYL 25 MCG/HR patch  Commonly known as:  DURAGESIC - dosed mcg/hr  Place 1  patch (25 mcg total) onto the skin every 3 (three) days.     gabapentin 300 MG capsule  Commonly known as:  NEURONTIN  Take 300 mg by mouth 4 (four) times daily.     oxyCODONE 5 MG immediate release tablet  Commonly known as:  Oxy IR/ROXICODONE  Take 1-3 tablets (5-15 mg total) by mouth every 4 (four) hours as needed.     sitaGLIPtin 50 MG tablet  Commonly known as:  JANUVIA  Take 1 tablet (50 mg total) by mouth daily.     tamsulosin 0.4 MG Caps capsule  Commonly known as:  FLOMAX  Take 1 capsule (0.4 mg total) by mouth daily.         The results of significant diagnostics from this hospitalization (including imaging, microbiology, ancillary and laboratory) are listed below for reference.    Significant Diagnostic Studies: Mr Lumbar Spine W Wo Contrast  06/11/2015  CLINICAL DATA:  Severe low back pain radiating to the left leg EXAM: MRI LUMBAR SPINE WITHOUT AND WITH CONTRAST TECHNIQUE: Multiplanar and multiecho pulse sequences of the lumbar spine were obtained without and with  intravenous contrast. CONTRAST:  17mL MULTIHANCE GADOBENATE DIMEGLUMINE 529 MG/ML IV SOLN COMPARISON:  None. FINDINGS: Severe L5 vertebral body height loss. The vertebral bodies of the lumbar spine are normal in alignment. There is normal bone marrow signal demonstrated throughout the vertebra. Posterior lumbar fusion from L2 through S1. Severe L4-5 disc space narrowing. There is increased T2 signal within the L5-S1 disc space with mild disc enhancement. There is no paravertebral fluid collection or edema. The spinal cord is normal in signal and contour. The cord terminates normally at T12 . The nerve roots of the cauda equina and the filum terminale are normal. The visualized portions of the SI joints are unremarkable. The imaged intra-abdominal contents are unremarkable. T12-L1: No significant disc bulge. No evidence of neural foraminal stenosis. No central canal stenosis. L1-L2: Mild broad-based disc bulge. No  evidence of neural foraminal stenosis. Mild bilateral facet arthropathy. Mild spinal stenosis. L2-L3: No significant disc bulge. No evidence of neural foraminal stenosis. No central canal stenosis. L3-L4: Limited evaluation secondary to susceptibility artifact. No significant disc bulge. No evidence of neural foraminal stenosis. No central canal stenosis. L4-L5: Limited evaluation secondary to susceptibility artifact. Severe disc height loss. No evidence of neural foraminal stenosis. No central canal stenosis. L5-S1: Increased T2 signal within the L5-S1 disc space with mild disc enhancement. Mild bilateral foraminal narrowing. No central canal stenosis. IMPRESSION: 1. Increased T2 signal within the L5-S1 disc space with mild disc enhancement. There is no adjacent marrow signal abnormality. There is no paravertebral soft tissue abnormality. This may reflect early discitis versus postsurgical changes. 2. Posterior lumbar fusion from L2 through S1. 3. Severe disc height loss at L4-5 and L5 vertebral body height loss which may be secondary to prior discitis or prior trauma. Electronically Signed   By: Elige KoHetal  Patel   On: 06/11/2015 12:34   Mr Hip Left W Wo Contrast  06/17/2015  CLINICAL DATA:  Severe left hip pain since a recent fall. EXAM: MRI OF THE LEFT HIP WITHOUT AND WITH CONTRAST TECHNIQUE: Multiplanar, multisequence MR imaging was performed both before and after administration of intravenous contrast. CONTRAST:  17mL MULTIHANCE GADOBENATE DIMEGLUMINE 529 MG/ML IV SOLN COMPARISON:  Radiographs dated 06/11/2015 FINDINGS: Bones: There is a new left low femoral neck fracture with impaction along the medial aspect with a nondisplaced hairline fracture extending superiorly into the greater trochanter. There is edema in the surrounding musculature, particularly the distal left iliopsoas muscle and in the proximal left vastus intermedius muscle as well some edema in the left adductor muscles. There is a small fluid  collection adjacent to the left lesser trochanter. There is surprisingly little edema in the femur at the site of the fracture. There is only slight enhancement at the fracture site after contrast administration. No other acute abnormality of the bones of the pelvis or hips. Previous lumbosacral fusion. The bladder is markedly distended to at least the level of the umbilicus. IMPRESSION: 1. New left low femoral neck impaction fracture along the medial cortex without displacement. This was not visible on the prior radiographs. 2. Edema in the adjacent musculature as described. 3. Marked distention of the bladder. Electronically Signed   By: Francene BoyersJames  Maxwell M.D.   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 Irregularity  and increased sclerosis is noted just below the head of the second metatarsal consistent with subacute injury. No fracture is identified. No bony erosion to suggest osteomyelitis is seen. IMPRESSION: Postoperative change. Changes consistent with a subacute fracture in the distal second metatarsal. No other focal abnormality is seen. Electronically Signed   By: Alcide Clever M.D.   On: 06/11/2015 10:05   Dg Hip Operative Unilat With Pelvis Left  06/18/2015  CLINICAL DATA:  Left femur IM nail EXAM: OPERATIVE LEFT HIP (WITH PELVIS IF PERFORMED) 2 VIEWS TECHNIQUE: Fluoroscopic spot image(s) were submitted for interpretation post-operatively. COMPARISON:  06/11/2015 MRI 06/16/2015 FINDINGS: Placement of dynamic hip screws and intra medullary nail seen on these images over the left hip. No hardware or bony complicating feature. IMPRESSION: Internal fixation across the the left femoral neck fracture. Electronically Signed   By: Charlett Nose M.D.   On:  06/18/2015 13:13   Dg Hip Unilat With Pelvis 2-3 Views Left  06/11/2015  CLINICAL DATA:  Pain following fall EXAM: DG HIP (WITH OR WITHOUT PELVIS) 2-3V LEFT COMPARISON:  November 30, 2006 FINDINGS: Frontal pelvis as well as frontal and lateral left hip images were obtained. There is slight symmetric narrowing of both hip joints. No fracture or dislocation. No erosive change. There is extensive postoperative change in the lower lumbar spine and upper sacral regions. IMPRESSION: No acute fracture or dislocation. Mild symmetric narrowing both hip joints. Extensive postoperative change in the visualized lower lumbar spine and upper sacral regions. Electronically Signed   By: Bretta Bang III M.D.   On: 06/11/2015 09:45     Microbiology: Recent Results (from the past 240 hour(s))  Urine culture     Status: None   Collection Time: 06/16/15  4:39 PM  Result Value Ref Range Status   Specimen Description URINE, CLEAN CATCH  Final   Special Requests Normal  Final   Culture   Final    MULTIPLE SPECIES PRESENT, SUGGEST RECOLLECTION Performed at [facility]    Report Status 06/18/2015 FINAL  Final  Surgical pcr screen     Status: Abnormal   Collection Time: 06/17/15 11:59 PM  Result Value Ref Range Status   MRSA, PCR POSITIVE (A) NEGATIVE Final    Comment: RESULT CALLED TO, READ BACK BY AND VERIFIED WITH: R SNIDERMAN @0301  06/18/15 MKELLY    Staphylococcus aureus POSITIVE (A) NEGATIVE Final    Comment:        The Xpert SA Assay (FDA approved for NASAL specimens in patients over 84 years of age), is one component of a comprehensive surveillance program.  Test performance has been validated by [facility] for patients greater than or equal to 15 year old. It is not intended to diagnose infection nor to guide or monitor treatment.   Culture, Urine     Status: None   Collection Time: 06/19/15  4:00 PM  Result Value Ref Range Status   Specimen Description URINE, CATHETERIZED  Final    Special Requests NONE  Final   Culture NO GROWTH 2 DAYS  Final   Report Status 06/21/2015 FINAL  Final  C difficile quick scan w PCR reflex     Status: None   Collection Time: 06/22/15  6:41 AM  Result Value Ref Range Status   C Diff antigen NEGATIVE NEGATIVE Final   C Diff toxin NEGATIVE NEGATIVE Final   C Diff interpretation Negative for toxigenic C. difficile  Final     Labs: Basic Metabolic Panel:  Recent Labs  Lab 06/18/15 1535  06/19/15 0647 06/20/15 0530 06/21/15 0402 06/22/15 0516  NA  --   --  129* 130* 130* 133*  K  --   < > 4.7 4.6 4.3 4.7  CL  --   --  94* 93* 94* 97*  CO2  --   --  26 29 29 30   GLUCOSE  --   --  170* 153* 134* 124*  BUN  --   --  9 7 7 6   CREATININE 0.74  --  0.68 0.71 0.60* 0.74  CALCIUM  --   --  8.7* 8.5* 8.4* 8.5*  < > = values in this interval not displayed. Liver Function Tests: No results for input(s): AST, ALT, ALKPHOS, BILITOT, PROT, ALBUMIN in the last 168 hours. No results for input(s): LIPASE, AMYLASE in the last 168 hours. No results for input(s): AMMONIA in the last 168 hours. CBC:  Recent Labs Lab 06/19/15 0647 06/20/15 0530 06/21/15 0402 06/22/15 0516 06/23/15 0456  WBC 12.0* 10.3 7.7 6.6 6.1  HGB 9.5* 9.0* 8.2* 8.1* 8.3*  HCT 29.2* 26.1* 24.8* 24.2* 24.8*  MCV 88.5 87.9 87.0 87.1 86.7  PLT 256 258 295 371 399   Cardiac Enzymes: No results for input(s): CKTOTAL, CKMB, CKMBINDEX, TROPONINI in the last 168 hours. BNP: Invalid input(s): POCBNP CBG:  Recent Labs Lab 06/22/15 0641 06/22/15 1118 06/22/15 1633 06/22/15 2156 06/23/15 0651  GLUCAP 125* 110* 130* 117* 98    Time coordinating discharge:  Greater than 30 minutes  Signed:  Toniette Devera, DO [facility] Pager: [phone] 06/23/2015, 7:51 AM

## 2015-06-23 NOTE — Clinical Social Work Note (Signed)
CSW met with patient and his family again, patient has changed his mind and chose not to go to SNF.  Patient and family agreeable to going home with home health, CSW recommends patient have home health social worker to help with community resources, transportation assistance, and in case patient decides he does need extra assistance in the home or SNF.  Case manager and physician made aware, CSW to sign off, please reconsult if other social work needs arise.   R. , MSW, LCSWA [phone] 06/23/2015 10:30 AM  

## 2015-06-24 LAB — TYPE AND SCREEN
ABO/RH(D): B POS
ANTIBODY SCREEN: NEGATIVE
UNIT DIVISION: 0

## 2015-07-01 ENCOUNTER — Emergency Department (HOSPITAL_COMMUNITY): Payer: Medicare Other

## 2015-07-01 ENCOUNTER — Encounter (HOSPITAL_COMMUNITY): Payer: Self-pay | Admitting: Emergency Medicine

## 2015-07-01 ENCOUNTER — Inpatient Hospital Stay (HOSPITAL_COMMUNITY)
Admission: EM | Admit: 2015-07-01 | Discharge: 2015-07-06 | DRG: 863 | Disposition: A | Discharge status: Skilled Nursing Facility | Payer: Medicare Other | Attending: Internal Medicine | Admitting: Internal Medicine

## 2015-07-01 DIAGNOSIS — Z9889 Other specified postprocedural states: Secondary | ICD-10-CM

## 2015-07-01 DIAGNOSIS — Z794 Long term (current) use of insulin: Secondary | ICD-10-CM

## 2015-07-01 DIAGNOSIS — T8131XA Disruption of external operation (surgical) wound, not elsewhere classified, initial encounter: Secondary | ICD-10-CM | POA: Diagnosis present

## 2015-07-01 DIAGNOSIS — S2242XA Multiple fractures of ribs, left side, initial encounter for closed fracture: Secondary | ICD-10-CM | POA: Diagnosis present

## 2015-07-01 DIAGNOSIS — E871 Hypo-osmolality and hyponatremia: Secondary | ICD-10-CM | POA: Diagnosis present

## 2015-07-01 DIAGNOSIS — B958 Unspecified staphylococcus as the cause of diseases classified elsewhere: Secondary | ICD-10-CM | POA: Diagnosis present

## 2015-07-01 DIAGNOSIS — E1169 Type 2 diabetes mellitus with other specified complication: Secondary | ICD-10-CM | POA: Diagnosis present

## 2015-07-01 DIAGNOSIS — Z981 Arthrodesis status: Secondary | ICD-10-CM

## 2015-07-01 DIAGNOSIS — Z79891 Long term (current) use of opiate analgesic: Secondary | ICD-10-CM

## 2015-07-01 DIAGNOSIS — T814XXA Infection following a procedure, initial encounter: Secondary | ICD-10-CM | POA: Diagnosis not present

## 2015-07-01 DIAGNOSIS — R7881 Bacteremia: Secondary | ICD-10-CM

## 2015-07-01 DIAGNOSIS — E11649 Type 2 diabetes mellitus with hypoglycemia without coma: Secondary | ICD-10-CM

## 2015-07-01 DIAGNOSIS — G8929 Other chronic pain: Secondary | ICD-10-CM | POA: Diagnosis present

## 2015-07-01 DIAGNOSIS — R0789 Other chest pain: Secondary | ICD-10-CM | POA: Diagnosis present

## 2015-07-01 DIAGNOSIS — X58XXXA Exposure to other specified factors, initial encounter: Secondary | ICD-10-CM | POA: Diagnosis present

## 2015-07-01 DIAGNOSIS — Y838 Other surgical procedures as the cause of abnormal reaction of the patient, or of later complication, without mention of misadventure at the time of the procedure: Secondary | ICD-10-CM | POA: Diagnosis present

## 2015-07-01 DIAGNOSIS — E119 Type 2 diabetes mellitus without complications: Secondary | ICD-10-CM

## 2015-07-01 DIAGNOSIS — E440 Moderate protein-calorie malnutrition: Secondary | ICD-10-CM | POA: Diagnosis present

## 2015-07-01 DIAGNOSIS — M25552 Pain in left hip: Secondary | ICD-10-CM | POA: Diagnosis present

## 2015-07-01 DIAGNOSIS — I5032 Chronic diastolic (congestive) heart failure: Secondary | ICD-10-CM | POA: Diagnosis present

## 2015-07-01 DIAGNOSIS — M25559 Pain in unspecified hip: Secondary | ICD-10-CM

## 2015-07-01 DIAGNOSIS — Z79899 Other long term (current) drug therapy: Secondary | ICD-10-CM

## 2015-07-01 DIAGNOSIS — M7989 Other specified soft tissue disorders: Secondary | ICD-10-CM | POA: Diagnosis present

## 2015-07-01 DIAGNOSIS — M549 Dorsalgia, unspecified: Secondary | ICD-10-CM | POA: Diagnosis present

## 2015-07-01 DIAGNOSIS — R29898 Other symptoms and signs involving the musculoskeletal system: Secondary | ICD-10-CM

## 2015-07-01 DIAGNOSIS — Z880 Allergy status to penicillin: Secondary | ICD-10-CM

## 2015-07-01 DIAGNOSIS — R000 Tachycardia, unspecified: Secondary | ICD-10-CM | POA: Diagnosis present

## 2015-07-01 DIAGNOSIS — M62838 Other muscle spasm: Secondary | ICD-10-CM | POA: Diagnosis not present

## 2015-07-01 DIAGNOSIS — S7292XA Unspecified fracture of left femur, initial encounter for closed fracture: Secondary | ICD-10-CM | POA: Diagnosis present

## 2015-07-01 LAB — CBC WITH DIFFERENTIAL/PLATELET
Basophils Absolute: 0 10*3/uL (ref 0.0–0.1)
Basophils Relative: 0 %
EOS ABS: 0.3 10*3/uL (ref 0.0–0.7)
Eosinophils Relative: 3 %
HCT: 32.4 % — ABNORMAL LOW (ref 39.0–52.0)
HEMATOCRIT: 31.8 % — AB (ref 39.0–52.0)
HEMOGLOBIN: 10.7 g/dL — AB (ref 13.0–17.0)
HEMOGLOBIN: 10.7 g/dL — AB (ref 13.0–17.0)
LYMPHS ABS: 1.7 10*3/uL (ref 0.7–4.0)
Lymphocytes Relative: 19 %
MCH: 29.2 pg (ref 26.0–34.0)
MCH: 28.9 pg (ref 26.0–34.0)
MCHC: 33.6 g/dL (ref 30.0–36.0)
MCHC: 33 g/dL (ref 30.0–36.0)
MCV: 86.9 fL (ref 78.0–100.0)
MCV: 87.6 fL (ref 78.0–100.0)
Monocytes Absolute: 0.6 10*3/uL (ref 0.1–1.0)
Monocytes Relative: 7 %
NEUTROS ABS: 6.2 10*3/uL (ref 1.7–7.7)
NEUTROS PCT: 71 %
Platelets: 169 10*3/uL (ref 150–400)
Platelets: 620 10*3/uL — ABNORMAL HIGH (ref 150–400)
RBC: 3.66 MIL/uL — AB (ref 4.22–5.81)
RBC: 3.7 MIL/uL — AB (ref 4.22–5.81)
RDW: 13.3 % (ref 11.5–15.5)
RDW: 13.5 % (ref 11.5–15.5)
WBC: 7.3 10*3/uL (ref 4.0–10.5)
WBC: 8.8 10*3/uL (ref 4.0–10.5)

## 2015-07-01 LAB — URINE MICROSCOPIC-ADD ON: RBC / HPF: NONE SEEN RBC/hpf (ref 0–5)

## 2015-07-01 LAB — COMPREHENSIVE METABOLIC PANEL
ALK PHOS: 208 U/L — AB (ref 38–126)
ALT: 19 U/L (ref 17–63)
ANION GAP: 11 (ref 5–15)
AST: 44 U/L — AB (ref 15–41)
Albumin: 3.3 g/dL — ABNORMAL LOW (ref 3.5–5.0)
BILIRUBIN TOTAL: 0.6 mg/dL (ref 0.3–1.2)
BUN: <5 mg/dL — ABNORMAL LOW (ref 6–20)
CALCIUM: 9 mg/dL (ref 8.9–10.3)
CO2: 25 mmol/L (ref 22–32)
Chloride: 92 mmol/L — ABNORMAL LOW (ref 101–111)
Creatinine, Ser: 0.68 mg/dL (ref 0.61–1.24)
GFR calc Af Amer: >60 mL/min (ref >60)
Glucose, Bld: 145 mg/dL — ABNORMAL HIGH (ref 65–99)
POTASSIUM: 4.2 mmol/L (ref 3.5–5.1)
Sodium: 128 mmol/L — ABNORMAL LOW (ref 135–145)
TOTAL PROTEIN: 7.2 g/dL (ref 6.5–8.1)

## 2015-07-01 LAB — RAPID URINE DRUG SCREEN, HOSP PERFORMED
AMPHETAMINES: NOT DETECTED
BARBITURATES: NOT DETECTED
BENZODIAZEPINES: NOT DETECTED
COCAINE: NOT DETECTED
OPIATES: NOT DETECTED
TETRAHYDROCANNABINOL: NOT DETECTED

## 2015-07-01 LAB — URINALYSIS, ROUTINE W REFLEX MICROSCOPIC
Bilirubin Urine: NEGATIVE
GLUCOSE, UA: NEGATIVE mg/dL
Hgb urine dipstick: NEGATIVE
Ketones, ur: NEGATIVE mg/dL
Nitrite: NEGATIVE
PH: 6.5 (ref 5.0–8.0)
PROTEIN: NEGATIVE mg/dL
SPECIFIC GRAVITY, URINE: 1.002 — AB (ref 1.005–1.030)

## 2015-07-01 LAB — SEDIMENTATION RATE: Sed Rate: 40 mm/hr — ABNORMAL HIGH (ref 0–16)

## 2015-07-01 LAB — C-REACTIVE PROTEIN: CRP: 3.4 mg/dL — ABNORMAL HIGH (ref <1.0)

## 2015-07-01 MED ORDER — DIAZEPAM 5 MG PO TABS
5.0000 mg | ORAL_TABLET | Freq: Once | ORAL | Status: AC
  Administered 2015-07-01: 5 mg via ORAL
  Filled 2015-07-01: qty 1

## 2015-07-01 MED ORDER — MORPHINE SULFATE (PF) 4 MG/ML IV SOLN
4.0000 mg | Freq: Once | INTRAVENOUS | Status: AC
  Administered 2015-07-01: 4 mg via INTRAVENOUS
  Filled 2015-07-01: qty 1

## 2015-07-01 MED ORDER — SODIUM CHLORIDE 0.9 % IV BOLUS (SEPSIS)
500.0000 mL | Freq: Once | INTRAVENOUS | Status: AC
  Administered 2015-07-01: 500 mL via INTRAVENOUS

## 2015-07-01 MED ORDER — GADOBENATE DIMEGLUMINE 529 MG/ML IV SOLN
20.0000 mL | Freq: Once | INTRAVENOUS | Status: AC | PRN
  Administered 2015-07-01: 18 mL via INTRAVENOUS

## 2015-07-01 MED ORDER — LORAZEPAM 2 MG/ML IJ SOLN
1.0000 mg | Freq: Once | INTRAMUSCULAR | Status: AC
  Administered 2015-07-01: 1 mg via INTRAVENOUS
  Filled 2015-07-01: qty 1

## 2015-07-01 NOTE — ED Notes (Signed)
Patient transported to MRI 

## 2015-07-01 NOTE — ED Provider Notes (Signed)
CSN: [id number]     Arrival date & time 07/01/15  1744 History   First MD Initiated Contact with Patient 07/01/15 1746     Chief Complaint  Patient presents with  . POst Op Swelling       The history is provided by the patient.   patient presents with inability to walk. Around 2 weeks ago he had an admission and had a left hip fracture. Discharge home with home health. Reportedly had been ambulatory since the surgery. States he can now no longer walk. States he cannot bend his legs without help. States he has severe pain in both hips and in his lower back. Denies fever. States that the home doctor said that both his legs are cold. Denies fevers. He still has his Foley catheter in place and it is covered with stool.  Past Medical History  Diagnosis Date  . Diabetes mellitus     regulated by diet  . Chronic back pain   . 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 Diabetic osteomyelitis (HCC) 03/29/2015  . Diabetic foot ulcer (HCC) 03/29/2015   Past Surgical History  Procedure Laterality Date  . Back surgery      screws & hardware to lower back  . 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 Femur im nail Left 06/18/2015    Procedure: INTRAMEDULLARY (IM) NAIL FEMORAL;  Surgeon: Tarry Kos, MD;  Location: [facility] OR;  Service: Orthopedics;  Laterality: Left;   History reviewed. No pertinent family history. Social History  Substance Use Topics  . Smoking status: Never Smoker   . Smokeless tobacco: None  . Alcohol Use: 3.6 oz/week    6 Cans of beer per week     Comment: daily    Review of Systems  Constitutional: Positive for appetite change.  Respiratory: Negative for shortness of breath.   Cardiovascular: Negative for chest pain.  Gastrointestinal: Negative for abdominal pain.  Genitourinary: Negative for flank pain.  Musculoskeletal: Positive for back pain.   Neurological: Positive for weakness and numbness.      Allergies  Penicillins  Home Medications   Prior to Admission medications   Medication Sig Start Date End Date Taking? Authorizing Provider  oxyCODONE (OXY IR/ROXICODONE) 5 MG immediate release tablet Take 1-3 tablets (5-15 mg total) by mouth every 4 (four) hours as needed. Patient taking differently: Take 5-15 mg by mouth every 4 (four) hours as needed for moderate pain.  06/18/15  Yes Tarry Kos, MD  enoxaparin (LOVENOX) 40 MG/0.4ML injection Inject 0.4 mLs (40 mg total) into the skin daily. Patient not taking: Reported on 07/01/2015 06/18/15   Tarry Kos, MD  feeding supplement, GLUCERNA SHAKE, (GLUCERNA SHAKE) LIQD Take 237 mLs by mouth 3 (three) times daily between meals. Patient not taking: Reported on 07/01/2015 06/21/15   Catarina Hartshorn, MD  fentaNYL (DURAGESIC - DOSED MCG/HR) 25 MCG/HR patch Place 1 patch (25 mcg total) onto the skin every 3 (three) days. Patient not taking: Reported on 07/01/2015 06/23/15   Catarina Hartshorn, MD  sitaGLIPtin (JANUVIA) 50 MG tablet Take 1 tablet (50 mg total) by mouth daily. Patient not taking: Reported on 07/01/2015 06/23/15   Catarina Hartshorn, MD  tamsulosin (FLOMAX) 0.4 MG CAPS capsule Take 1 capsule (0.4 mg total) by mouth daily. Patient not taking: Reported on 07/01/2015 06/21/15   Catarina Hartshorn, MD   BP 112/71  mmHg  Pulse 105  Temp(Src) 98 F (36.7 C) (Oral)  Resp 24  Ht 5\' 9"  (1.753 m)  Wt 184 lb (83.462 kg)  BMI 27.16 kg/m2  SpO2 97% Physical Exam  Constitutional: He appears well-developed.  HENT:  Head: Atraumatic.  Cardiovascular: Normal rate.   Pulmonary/Chest: Effort normal.  Abdominal: Soft.  Musculoskeletal:  Unable to extend or really move legs and hip or knees. Will not flex or extend the toes or ankles. Somewhat cool feet. Denies being able to feel me on both his feet and lower legs. His chronic wounds on his knees and toes. Healing left posterior hip  surgical site. Also more erythema on  anterior/lateral left hip site. Tenderness overbilateral SI areas.   Skin: Skin is warm.    ED Course  Procedures (including critical care time) Labs Review Labs Reviewed  COMPREHENSIVE METABOLIC PANEL - Abnormal; Notable for the following:    Sodium 128 (*)    Chloride 92 (*)    Glucose, Bld 145 (*)    BUN <5 (*)    Albumin 3.3 (*)    AST 44 (*)    Alkaline Phosphatase 208 (*)    All other components within normal limits  CBC WITH DIFFERENTIAL/PLATELET - Abnormal; Notable for the following:    RBC 3.66 (*)    Hemoglobin 10.7 (*)    HCT 31.8 (*)    All other components within normal limits  URINALYSIS, ROUTINE W REFLEX MICROSCOPIC (NOT AT [facility]) - Abnormal; Notable for the following:    Specific Gravity, Urine 1.002 (*)    Leukocytes, UA SMALL (*)    All other components within normal limits  C-REACTIVE PROTEIN - Abnormal; Notable for the following:    CRP 3.4 (*)    All other components within normal limits  SEDIMENTATION RATE - Abnormal; Notable for the following:    Sed Rate 40 (*)    All other components within normal limits  CBC WITH DIFFERENTIAL/PLATELET - Abnormal; Notable for the following:    RBC 3.70 (*)    Hemoglobin 10.7 (*)    HCT 32.4 (*)    Platelets 620 (*)    All other components within normal limits  URINE MICROSCOPIC-ADD ON - Abnormal; Notable for the following:    Squamous Epithelial / LPF 0-5 (*)    Bacteria, UA RARE (*)    All other components within normal limits  CULTURE, BLOOD (ROUTINE X 2)  CULTURE, BLOOD (ROUTINE X 2)  URINE RAPID DRUG SCREEN, HOSP PERFORMED    Imaging Review Mr Lumbar Spine W Wo Contrast  07/01/2015  CLINICAL DATA:  Initial evaluation for low back pain. EXAM: MRI LUMBAR SPINE WITHOUT AND WITH CONTRAST TECHNIQUE: Multiplanar and multiecho pulse sequences of the lumbar spine were obtained without and with intravenous contrast. CONTRAST:  18mL MULTIHANCE GADOBENATE DIMEGLUMINE 529 MG/ML IV SOLN COMPARISON:  Prior MRI from  06/11/2015. FINDINGS: Study is moderately degraded by motion artifact. Alignment is stable. Severe height loss at the L5 vertebral body again seen. Bone marrow signal intensity within normal limits, and unchanged. Posterior lumbar fusion from L2 through S1 again seen. Severe L4-5 disc space narrowing, unchanged. Increased T2 signal intensity within the L5-S1 disc space is stable from previous. Mild disc enhancement also likely unchanged, although not as well evaluated on this motion degraded study. No associated paraspinous soft tissue inflammatory changes. No definite fluid collection Conus medullaris terminates normally at the T12 level. Signal intensity within the visualized cord is normal. No acute paraspinous soft  tissue abnormality. Marked urinary bladder distention noted, similar to previous. T12-L1:  Normal intervertebral disc.  No stenosis. L1-2: Degenerative disc bulge with disc desiccation. No focal disc herniation. Prominent epidural fat at the dorsal aspect of the thecal sac. Moderate bilateral facet arthrosis. Resultant moderate canal narrowing is stable. Mild bilateral foraminal narrowing also unchanged. L2-3: No significant disc bulge or focal disc protrusion. Status post decompression. No significant stenosis. L3-4: Somewhat limited evaluation due to susceptibility artifact. Probable mild disc bulge with disc desiccation. No significant canal stenosis. Foramina appear patent bilaterally. L4-5: Severe degenerative intervertebral disc space narrowing with disc desiccation and endplate changes, stable. No significant canal or foraminal stenosis. L5-S1: Fluid signal intensity within the L5-S1 disc, which is somewhat wide and anteriorly. Mild discal enhancement. This is stable relative to previous. No progressive endplate osseous erosion. Mild bilateral foraminal narrowing, worse on the right.No central canal stenosis. IMPRESSION: 1. Stable appearance of the lumbar spine. 2. Increased T2 signal intensity  within the L5-S1 disc with mild discal enhancement. Given the relative stability of this finding and absence of adjacent paraspinous inflammatory changes, postsurgical changes are favored, although again, possible infection is not entirely excluded. 3. Stable posterior lumbar fusion from L2 through S1. 4. Severe disc height loss at L4-5 with L5 vertebral body height loss. 5. Marked urinary bladder distension, similar to previous. Electronically Signed   By: Rise Mu M.D.   On: 07/01/2015 23:41   Dg Hips Bilat With Pelvis 3-4 Views  07/01/2015  CLINICAL DATA:  Left and right hip pain. Left hip surgery 3 weeks prior. EXAM: DG HIP (WITH OR WITHOUT PELVIS) 3-4V BILAT COMPARISON:  Pelvis and hip MRI 06/16/2015 FINDINGS: Intra medullary rod with trans trochanteric screws traverse the left proximal femur, low femoral neck fracture not well seen radiographically. No new fracture or periprosthetic lucency. Distal aspect of the intra medullary rod not included in the field of view. Skin staples laterally and anteriorly at the surgical site with probable soft tissue edema. Pubic rami and pubic symphysis are intact. Right femoral head is seated in the acetabulum, unchanged from prior MRI. Lumbosacral hardware again seen. IMPRESSION: 1. Recent intra medullary rods and screw fixation of proximal left femur fracture. No postoperative complication. Skin staples remain in place with probable soft tissue edema. 2. No acute abnormality of the right hip or bony pelvis. Postsurgical change in the lower lumbar spine and pelvis. Electronically Signed   By: Rubye Oaks M.D.   On: 07/01/2015 19:19   I have personally reviewed and evaluated these images and lab results as part of my medical decision-making.   EKG Interpretation None      MDM   Final diagnoses:  Back pain  Hyponatremia  Weakness of both lower extremities    Patient with back pain leg pain weakness in his legs hyponatremia. Recent admission  and had hip surgery. Had been recommended for skilled nursing the patient refused has been doing poorly at home. Has been able urinate on his own with Foley out. MRI done due to abnormal findings on previous and no neuro deficits. Still not able ambulate. Will not move legs much. Admit to internal medicine.    Benjiman Core, MD 07/02/15 [phone]

## 2015-07-01 NOTE — ED Notes (Signed)
Pt brought back from MRI via stretcher.

## 2015-07-01 NOTE — ED Notes (Signed)
Pt has urinal at bedside 

## 2015-07-01 NOTE — ED Notes (Signed)
MD at bedside. 

## 2015-07-01 NOTE — ED Notes (Signed)
Bed: WA02 Expected date:  Expected time:  Means of arrival:  Comments: EMS- post-op/BLE swelling

## 2015-07-01 NOTE — ED Notes (Signed)
Ems states pt had surgery 3 weeks ago, pt states that 3 days ago he hasn't been able to walk on own.  EMS states pt has pain at surgical site but no swelling.  Swelling at feet.

## 2015-07-02 ENCOUNTER — Inpatient Hospital Stay (HOSPITAL_COMMUNITY): Payer: Medicare Other

## 2015-07-02 DIAGNOSIS — R7881 Bacteremia: Secondary | ICD-10-CM | POA: Diagnosis not present

## 2015-07-02 DIAGNOSIS — G8929 Other chronic pain: Secondary | ICD-10-CM | POA: Diagnosis present

## 2015-07-02 DIAGNOSIS — M549 Dorsalgia, unspecified: Secondary | ICD-10-CM | POA: Diagnosis present

## 2015-07-02 DIAGNOSIS — M62838 Other muscle spasm: Secondary | ICD-10-CM | POA: Diagnosis not present

## 2015-07-02 DIAGNOSIS — Z79891 Long term (current) use of opiate analgesic: Secondary | ICD-10-CM | POA: Diagnosis not present

## 2015-07-02 DIAGNOSIS — S2242XA Multiple fractures of ribs, left side, initial encounter for closed fracture: Secondary | ICD-10-CM | POA: Diagnosis present

## 2015-07-02 DIAGNOSIS — Z794 Long term (current) use of insulin: Secondary | ICD-10-CM | POA: Diagnosis not present

## 2015-07-02 DIAGNOSIS — Z981 Arthrodesis status: Secondary | ICD-10-CM | POA: Diagnosis not present

## 2015-07-02 DIAGNOSIS — M7989 Other specified soft tissue disorders: Secondary | ICD-10-CM | POA: Diagnosis not present

## 2015-07-02 DIAGNOSIS — Z880 Allergy status to penicillin: Secondary | ICD-10-CM | POA: Diagnosis not present

## 2015-07-02 DIAGNOSIS — B958 Unspecified staphylococcus as the cause of diseases classified elsewhere: Secondary | ICD-10-CM | POA: Diagnosis present

## 2015-07-02 DIAGNOSIS — S72002D Fracture of unspecified part of neck of left femur, subsequent encounter for closed fracture with routine healing: Secondary | ICD-10-CM | POA: Diagnosis not present

## 2015-07-02 DIAGNOSIS — E1169 Type 2 diabetes mellitus with other specified complication: Secondary | ICD-10-CM | POA: Diagnosis present

## 2015-07-02 DIAGNOSIS — R29898 Other symptoms and signs involving the musculoskeletal system: Secondary | ICD-10-CM | POA: Insufficient documentation

## 2015-07-02 DIAGNOSIS — M25552 Pain in left hip: Secondary | ICD-10-CM | POA: Diagnosis not present

## 2015-07-02 DIAGNOSIS — R000 Tachycardia, unspecified: Secondary | ICD-10-CM | POA: Diagnosis present

## 2015-07-02 DIAGNOSIS — R0789 Other chest pain: Secondary | ICD-10-CM | POA: Diagnosis not present

## 2015-07-02 DIAGNOSIS — Z9889 Other specified postprocedural states: Secondary | ICD-10-CM | POA: Diagnosis not present

## 2015-07-02 DIAGNOSIS — E11649 Type 2 diabetes mellitus with hypoglycemia without coma: Secondary | ICD-10-CM | POA: Diagnosis not present

## 2015-07-02 DIAGNOSIS — T8131XA Disruption of external operation (surgical) wound, not elsewhere classified, initial encounter: Secondary | ICD-10-CM | POA: Diagnosis present

## 2015-07-02 DIAGNOSIS — Y838 Other surgical procedures as the cause of abnormal reaction of the patient, or of later complication, without mention of misadventure at the time of the procedure: Secondary | ICD-10-CM | POA: Diagnosis present

## 2015-07-02 DIAGNOSIS — T814XXA Infection following a procedure, initial encounter: Secondary | ICD-10-CM | POA: Diagnosis present

## 2015-07-02 DIAGNOSIS — X58XXXA Exposure to other specified factors, initial encounter: Secondary | ICD-10-CM | POA: Diagnosis present

## 2015-07-02 DIAGNOSIS — E871 Hypo-osmolality and hyponatremia: Secondary | ICD-10-CM | POA: Diagnosis not present

## 2015-07-02 DIAGNOSIS — E440 Moderate protein-calorie malnutrition: Secondary | ICD-10-CM | POA: Diagnosis not present

## 2015-07-02 DIAGNOSIS — Z79899 Other long term (current) drug therapy: Secondary | ICD-10-CM | POA: Diagnosis not present

## 2015-07-02 DIAGNOSIS — I5032 Chronic diastolic (congestive) heart failure: Secondary | ICD-10-CM | POA: Diagnosis present

## 2015-07-02 LAB — COMPREHENSIVE METABOLIC PANEL
ALT: 19 U/L (ref 17–63)
AST: 35 U/L (ref 15–41)
Albumin: 3.3 g/dL — ABNORMAL LOW (ref 3.5–5.0)
Alkaline Phosphatase: 202 U/L — ABNORMAL HIGH (ref 38–126)
Anion gap: 15 (ref 5–15)
CHLORIDE: 101 mmol/L (ref 101–111)
CO2: 23 mmol/L (ref 22–32)
Calcium: 9.3 mg/dL (ref 8.9–10.3)
Creatinine, Ser: 0.49 mg/dL — ABNORMAL LOW (ref 0.61–1.24)
Glucose, Bld: 137 mg/dL — ABNORMAL HIGH (ref 65–99)
POTASSIUM: 4.7 mmol/L (ref 3.5–5.1)
SODIUM: 139 mmol/L (ref 135–145)
Total Bilirubin: 1 mg/dL (ref 0.3–1.2)
Total Protein: 7.5 g/dL (ref 6.5–8.1)

## 2015-07-02 LAB — BRAIN NATRIURETIC PEPTIDE: B NATRIURETIC PEPTIDE 5: 34 pg/mL (ref 0.0–100.0)

## 2015-07-02 LAB — GLUCOSE, CAPILLARY
GLUCOSE-CAPILLARY: 159 mg/dL — AB (ref 65–99)
GLUCOSE-CAPILLARY: 143 mg/dL — AB (ref 65–99)
Glucose-Capillary: 125 mg/dL — ABNORMAL HIGH (ref 65–99)
Glucose-Capillary: 136 mg/dL — ABNORMAL HIGH (ref 65–99)
Glucose-Capillary: 151 mg/dL — ABNORMAL HIGH (ref 65–99)

## 2015-07-02 LAB — CBC
HEMATOCRIT: 35 % — AB (ref 39.0–52.0)
Hemoglobin: 11.5 g/dL — ABNORMAL LOW (ref 13.0–17.0)
MCH: 29 pg (ref 26.0–34.0)
MCHC: 32.9 g/dL (ref 30.0–36.0)
MCV: 88.4 fL (ref 78.0–100.0)
Platelets: 681 10*3/uL — ABNORMAL HIGH (ref 150–400)
RBC: 3.96 MIL/uL — AB (ref 4.22–5.81)
RDW: 13.5 % (ref 11.5–15.5)
WBC: 8 10*3/uL (ref 4.0–10.5)

## 2015-07-02 MED ORDER — VANCOMYCIN HCL 10 G IV SOLR
1500.0000 mg | Freq: Once | INTRAVENOUS | Status: AC
  Administered 2015-07-02: 1500 mg via INTRAVENOUS
  Filled 2015-07-02: qty 1500

## 2015-07-02 MED ORDER — ACETAMINOPHEN 650 MG RE SUPP: 650.0000 mg | Freq: Four times a day (QID) | RECTAL | Status: DC | PRN

## 2015-07-02 MED ORDER — ONDANSETRON HCL 4 MG PO TABS: 4.0000 mg | ORAL_TABLET | Freq: Four times a day (QID) | ORAL | Status: DC | PRN

## 2015-07-02 MED ORDER — GLUCERNA SHAKE PO LIQD
237.0000 mL | Freq: Three times a day (TID) | ORAL | Status: DC
Start: 2015-07-02 — End: 2015-07-06
  Administered 2015-07-02 – 2015-07-05 (×8): 237 mL via ORAL
  Filled 2015-07-02 (×15): qty 237

## 2015-07-02 MED ORDER — ONDANSETRON HCL 4 MG/2ML IJ SOLN: 4.0000 mg | Freq: Four times a day (QID) | INTRAMUSCULAR | Status: DC | PRN

## 2015-07-02 MED ORDER — ALUM & MAG HYDROXIDE-SIMETH 200-200-20 MG/5ML PO SUSP: 30.0000 mL | Freq: Four times a day (QID) | ORAL | Status: DC | PRN

## 2015-07-02 MED ORDER — ACETAMINOPHEN 325 MG PO TABS: 650.0000 mg | ORAL_TABLET | Freq: Four times a day (QID) | ORAL | Status: DC | PRN

## 2015-07-02 MED ORDER — SODIUM CHLORIDE 0.9 % IJ SOLN
3.0000 mL | Freq: Two times a day (BID) | INTRAMUSCULAR | Status: DC
  Administered 2015-07-02 – 2015-07-06 (×6): 3 mL via INTRAVENOUS

## 2015-07-02 MED ORDER — MORPHINE SULFATE (PF) 2 MG/ML IV SOLN
2.0000 mg | INTRAVENOUS | Status: DC | PRN
  Administered 2015-07-02 – 2015-07-04 (×12): 2 mg via INTRAVENOUS
  Filled 2015-07-02 (×12): qty 1

## 2015-07-02 MED ORDER — OXYCODONE HCL 5 MG PO TABS
5.0000 mg | ORAL_TABLET | ORAL | Status: DC | PRN
  Administered 2015-07-02 (×2): 10 mg via ORAL
  Administered 2015-07-03 (×3): 15 mg via ORAL
  Administered 2015-07-04: 10 mg via ORAL
  Administered 2015-07-04 – 2015-07-05 (×3): 15 mg via ORAL
  Administered 2015-07-05: 10 mg via ORAL
  Administered 2015-07-06: 15 mg via ORAL
  Filled 2015-07-02: qty 2
  Filled 2015-07-02: qty 3
  Filled 2015-07-02: qty 2
  Filled 2015-07-02: qty 3
  Filled 2015-07-02: qty 2
  Filled 2015-07-02 (×5): qty 3
  Filled 2015-07-02: qty 2

## 2015-07-02 MED ORDER — HEPARIN SODIUM (PORCINE) 5000 UNIT/ML IJ SOLN
5000.0000 [IU] | Freq: Three times a day (TID) | INTRAMUSCULAR | Status: DC
  Administered 2015-07-02 – 2015-07-06 (×13): 5000 [IU] via SUBCUTANEOUS
  Filled 2015-07-02 (×16): qty 1

## 2015-07-02 MED ORDER — TAMSULOSIN HCL 0.4 MG PO CAPS
0.4000 mg | ORAL_CAPSULE | Freq: Every day | ORAL | Status: DC
  Administered 2015-07-02 – 2015-07-06 (×5): 0.4 mg via ORAL
  Filled 2015-07-02 (×5): qty 1

## 2015-07-02 MED ORDER — CYCLOBENZAPRINE HCL 5 MG PO TABS
7.5000 mg | ORAL_TABLET | Freq: Three times a day (TID) | ORAL | Status: DC
  Administered 2015-07-02 – 2015-07-04 (×7): 7.5 mg via ORAL
  Filled 2015-07-02: qty 1.5
  Filled 2015-07-02: qty 2
  Filled 2015-07-02 (×2): qty 1.5
  Filled 2015-07-02: qty 2
  Filled 2015-07-02: qty 1.5
  Filled 2015-07-02 (×2): qty 2
  Filled 2015-07-02 (×2): qty 1.5

## 2015-07-02 MED ORDER — INSULIN ASPART 100 UNIT/ML ~~LOC~~ SOLN
0.0000 [IU] | Freq: Three times a day (TID) | SUBCUTANEOUS | Status: DC
  Administered 2015-07-02: 1 [IU] via SUBCUTANEOUS
  Administered 2015-07-02: 2 [IU] via SUBCUTANEOUS
  Administered 2015-07-02: 1 [IU] via SUBCUTANEOUS
  Administered 2015-07-03: 2 [IU] via SUBCUTANEOUS
  Administered 2015-07-03 – 2015-07-04 (×3): 1 [IU] via SUBCUTANEOUS
  Administered 2015-07-04 (×2): 2 [IU] via SUBCUTANEOUS
  Administered 2015-07-05 – 2015-07-06 (×4): 1 [IU] via SUBCUTANEOUS

## 2015-07-02 MED ORDER — VANCOMYCIN HCL IN DEXTROSE 1-5 GM/200ML-% IV SOLN
1000.0000 mg | Freq: Two times a day (BID) | INTRAVENOUS | Status: DC
  Administered 2015-07-02 – 2015-07-06 (×8): 1000 mg via INTRAVENOUS
  Filled 2015-07-02 (×8): qty 200

## 2015-07-02 NOTE — Care Management Note (Signed)
Case Management Note  Patient Details  Name: Jeff Ferrell MRN: [id number] Date of Birth: 05/14/1951  Subjective/Objective:        Hypovolemia, hyponatremia, hx of recent left thigh nailing and now unable to use right leg/            Action/Plan:Date: July 02, 2015 Chart reviewed for concurrent status and case management needs. Will continue to follow patient for changes and needs: [name], RN, BSN, [place]   [phone]   Expected Discharge Date:                  Expected Discharge Plan:  Skilled Nursing Facility  In-House Referral:  Clinical Social Work  Discharge planning Services  CM Consult  Post Acute Care Choice:  NA Choice offered to:  NA  DME Arranged:    DME Agency:     HH Arranged:    HH Agency:     Status of Service:  In process, will continue to follow  Medicare Important Message Given:    Date Medicare IM Given:    Medicare IM give by:    Date Additional Medicare IM Given:    Additional Medicare Important Message give by:     If discussed at Long Length of Stay Meetings, dates discussed:    Additional Comments:  [name], [name], RN 07/02/2015, 10:53 AM

## 2015-07-02 NOTE — Progress Notes (Signed)
VASCULAR LAB PRELIMINARY  PRELIMINARY  PRELIMINARY  PRELIMINARY  Bilateral lower extremity venous duplex completed.    Preliminary report:  Technically difficult due to constant drawing of the legs due to incontrollable cramping. Bilateral:  No evidence of DVT, superficial thrombosis, or Baker's Cyst.   Virgel Haro, RVS 07/02/2015, 10:40 AM

## 2015-07-02 NOTE — Progress Notes (Signed)
[facility] PROGRESS NOTE  Jeff Ferrell ZOX:[id number] DOB: February 07, 1951 DOA: 07/01/2015  PCP: [name], MD  Brief HPI: 65 year old African-American male with the recent left hip fracture status post surgery, diabetes, chronic back pain, chronic diastolic CHF, presents with the hip pain and inability to walk. Patient was hospitalized late December and discharged early January. He underwent surgery for hip fracture. At that time skilled nursing facility for short-term rehabilitation was recommended. However, patient declined and went home.  Past medical history:  Past Medical History  Diagnosis Date  . Diabetes mellitus     regulated by diet  . Chronic back pain   . 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 Diabetic osteomyelitis (HCC) 03/29/2015  . Diabetic foot ulcer (HCC) 03/29/2015    Consultants: Orthopedics  Procedures:  Lower extremity venous Doppler No DVT noted  Antibiotics: Placed on Vanc by Ortho  Subjective: Patient complains of pain in both his hips. Left is worse in the right. He has noticed drainage from his incision sites or in the left hip area. Poor historian most likely due to pain issues.  Objective: Vital Signs  Filed Vitals:   07/02/15 0238 07/02/15 0339 07/02/15 0659 07/02/15 1115  BP: 129/89 134/76 133/75 103/63  Pulse: 117 113 125 123  Temp: 99 F (37.2 C) 98.6 F (37 C) 98.9 F (37.2 C) 97.8 F (36.6 C)  TempSrc: Oral Oral Oral Oral  Resp:  20 20 28   Height:      Weight:      SpO2: 92% 100% 93% 96%    Intake/Output Summary (Last 24 hours) at 07/02/15 1354 Last data filed at 07/02/15 1000  Gross per 24 hour  Intake    120 ml  Output   2575 ml  Net  -2455 ml   Filed Weights   07/01/15 1804  Weight: 83.462 kg (184 lb)    General appearance: alert, appears stated age, distracted and no distress Resp: Good air entry bilaterally without any wheezing, rales or rhonchi Cardio: regular rate and rhythm, S1, S2 normal,  no murmur, click, rub or gallop GI: soft, non-tender; bowel sounds normal; no masses,  no organomegaly Extremities: 3 different incisions sites are present. One of his covered with dressing due to bloody discharge. Other 2 sites did not show any erythema or discharge. Pedal pulses are palpable. Lymph nodes: Cervical, supraclavicular, and axillary nodes normal. Neurologic: Alert and oriented X 3. Cranial nerves II-12 intact. Moving all his extremities. Appears to be in considerable discomfort.  Lab Results:  Basic Metabolic Panel:  Recent Labs Lab 07/01/15 1836 07/02/15 0538  NA 128* 139  K 4.2 4.7  CL 92* 101  CO2 25 23  GLUCOSE 145* 137*  BUN <5* <5*  CREATININE 0.68 0.49*  CALCIUM 9.0 9.3   Liver Function Tests:  Recent Labs Lab 07/01/15 1836 07/02/15 0538  AST 44* 35  ALT 19 19  ALKPHOS 208* 202*  BILITOT 0.6 1.0  PROT 7.2 7.5  ALBUMIN 3.3* 3.3*   CBC:  Recent Labs Lab 07/01/15 1836 07/01/15 2041 07/02/15 0538  WBC 7.3 8.8 8.0  NEUTROABS PENDING 6.2  --   HGB 10.7* 10.7* 11.5*  HCT 31.8* 32.4* 35.0*  MCV 86.9 87.6 88.4  PLT 169 620* 681*   BNP (last 3 results)  Recent Labs  06/11/15 1106 07/02/15 0538  BNP 27.7 34.0    CBG:  Recent Labs Lab 07/02/15 0326 07/02/15 0740 07/02/15 1202  GLUCAP 125* 136* 159*    No results found  for this or any previous visit (from the past 240 hour(s)).    Studies/Results: Mr Lumbar Spine W Wo Contrast  07/01/2015  CLINICAL DATA:  Initial evaluation for low back pain. EXAM: MRI LUMBAR SPINE WITHOUT AND WITH CONTRAST TECHNIQUE: Multiplanar and multiecho pulse sequences of the lumbar spine were obtained without and with intravenous contrast. CONTRAST:  18mL MULTIHANCE GADOBENATE DIMEGLUMINE 529 MG/ML IV SOLN COMPARISON:  Prior MRI from 06/11/2015. FINDINGS: Study is moderately degraded by motion artifact. Alignment is stable. Severe height loss at the L5 vertebral body again seen. Bone marrow signal intensity  within normal limits, and unchanged. Posterior lumbar fusion from L2 through S1 again seen. Severe L4-5 disc space narrowing, unchanged. Increased T2 signal intensity within the L5-S1 disc space is stable from previous. Mild disc enhancement also likely unchanged, although not as well evaluated on this motion degraded study. No associated paraspinous soft tissue inflammatory changes. No definite fluid collection Conus medullaris terminates normally at the T12 level. Signal intensity within the visualized cord is normal. No acute paraspinous soft tissue abnormality. Marked urinary bladder distention noted, similar to previous. T12-L1:  Normal intervertebral disc.  No stenosis. L1-2: Degenerative disc bulge with disc desiccation. No focal disc herniation. Prominent epidural fat at the dorsal aspect of the thecal sac. Moderate bilateral facet arthrosis. Resultant moderate canal narrowing is stable. Mild bilateral foraminal narrowing also unchanged. L2-3: No significant disc bulge or focal disc protrusion. Status post decompression. No significant stenosis. L3-4: Somewhat limited evaluation due to susceptibility artifact. Probable mild disc bulge with disc desiccation. No significant canal stenosis. Foramina appear patent bilaterally. L4-5: Severe degenerative intervertebral disc space narrowing with disc desiccation and endplate changes, stable. No significant canal or foraminal stenosis. L5-S1: Fluid signal intensity within the L5-S1 disc, which is somewhat wide and anteriorly. Mild discal enhancement. This is stable relative to previous. No progressive endplate osseous erosion. Mild bilateral foraminal narrowing, worse on the right.No central canal stenosis. IMPRESSION: 1. Stable appearance of the lumbar spine. 2. Increased T2 signal intensity within the L5-S1 disc with mild discal enhancement. Given the relative stability of this finding and absence of adjacent paraspinous inflammatory changes, postsurgical changes  are favored, although again, possible infection is not entirely excluded. 3. Stable posterior lumbar fusion from L2 through S1. 4. Severe disc height loss at L4-5 with L5 vertebral body height loss. 5. Marked urinary bladder distension, similar to previous. Electronically Signed   By: Rise MuBenjamin  McClintock M.D.   On: 07/01/2015 23:41   Dg Chest Port 1 View  07/02/2015  CLINICAL DATA:  Left chest wall pain. EXAM: PORTABLE CHEST 1 VIEW COMPARISON:  None. FINDINGS: The heart is normal in size. Mild tortuosity of the thoracic aorta. Question vascular congestion versus vascular crowding secondary to low lung volumes. a No consolidation, pleural effusion, or pneumothorax. Minimally displaced fractures of left posterior fourth, fifth, and sixth ribs. IMPRESSION: Minimally displaced fractures of left posterior fourth, fifth, and sixth ribs, age indeterminate. No pulmonary complication. Electronically Signed   By: Rubye OaksMelanie  Ehinger M.D.   On: 07/02/2015 02:40   Dg Hips Bilat With Pelvis 3-4 Views  07/01/2015  CLINICAL DATA:  Left and right hip pain. Left hip surgery 3 weeks prior. EXAM: DG HIP (WITH OR WITHOUT PELVIS) 3-4V BILAT COMPARISON:  Pelvis and hip MRI 06/16/2015 FINDINGS: Intra medullary rod with trans trochanteric screws traverse the left proximal femur, low femoral neck fracture not well seen radiographically. No new fracture or periprosthetic lucency. Distal aspect of the intra medullary rod not  included in the field of view. Skin staples laterally and anteriorly at the surgical site with probable soft tissue edema. Pubic rami and pubic symphysis are intact. Right femoral head is seated in the acetabulum, unchanged from prior MRI. Lumbosacral hardware again seen. IMPRESSION: 1. Recent intra medullary rods and screw fixation of proximal left femur fracture. No postoperative complication. Skin staples remain in place with probable soft tissue edema. 2. No acute abnormality of the right hip or bony pelvis.  Postsurgical change in the lower lumbar spine and pelvis. Electronically Signed   By: Rubye Oaks M.D.   On: 07/01/2015 19:19    Medications:  Scheduled: . cyclobenzaprine  7.5 mg Oral TID  . feeding supplement (GLUCERNA SHAKE)  237 mL Oral TID BM  . heparin  5,000 Units Subcutaneous 3 times per day  . insulin aspart  0-9 Units Subcutaneous TID WC  . sodium chloride  3 mL Intravenous Q12H  . tamsulosin  0.4 mg Oral Daily  . vancomycin  1,500 mg Intravenous Once   Continuous:  BJY:[id number] **OR** acetaminophen, alum & mag hydroxide-simeth, morphine injection, ondansetron **OR** ondansetron (ZOFRAN) IV, oxyCODONE  Assessment/Plan:  Principal Problem:   Left hip pain Active Problems:   Diabetes mellitus (HCC)   Malnutrition of moderate degree   Back pain   Femur fracture, left (HCC)   Hyponatremia   Left leg swelling   Left-sided chest wall pain    Bilateral Hip pain Patient has bilateral hip pain. The right hip seems to be secondary issue from putting more weight recently. Patient cannot walk due to severe pain. MRI of her lumbar spine is more consistent with post surgical change. X-ray of left hip showed post surgical changes without evidence of complications. Some drainage noted from one of the incision sites. Discussed with orthopedics and they will consult. Patient seen by orthopedics and placed on intravenous vancomycin. Continue pain medications. Tachycardia most likely due to severe pain. Monitor for now.  Hx of recent femur fracture, left As above.  DM-II Last A1c 7.6, fairly controled. Patient is taking Januvia at home. SSI  Malnutrition of moderate degree Ensure.  Hyponatremia Likely due to decreased oral intake. I am level has improved with IV fluids. Continue to monitor for now.   Left leg swelling Swelling is most likely due to recent surgery. Doppler studies negative for DVT. BNP is normal.  Left-sided chest wall pain Chest x-ray showed  minimally displaced fractures of left posterior fourth, fifth, and sixth ribs, age indeterminate. No pulmonary complication. Pain control as above  DVT Prophylaxis: Subcutaneous heparin    Code Status: Full code  Family Communication: Discussed with the patient  Disposition Plan: PT and OT to evaluate. Orthopedic recommendations noted. It is quite possible he may need to go to a skilled nursing facility for rehabilitation.    LOS: 0 days   [facility]  [facility] Pager [phone] 07/02/2015, 1:54 PM  If 7PM-7AM, please contact night-coverage at [email or web address], password [facility]

## 2015-07-02 NOTE — Evaluation (Signed)
Physical Therapy Evaluation Patient Details Name: Jeff Ferrell MRN: [id number] DOB: 12/29/1950 Today's Date: 07/02/2015   History of Present Illness  65 yo male adm with bil hip pain, L hip incision dehiscence 07/01/15; PMHx:  L great toe amp,  multiple rib fxs, L hip fx --s/p Im nail  06/17/16, back surgery and chronic pain  Clinical Impression  Pt admitted with above diagnosis. Pt currently with functional limitations due to the deficits listed below (see PT Problem List).   Pt will benefit from skilled PT to increase their independence and safety with mobility to allow discharge to the venue listed below.       Follow Up Recommendations SNF;Supervision/Assistance - 24 hour    Equipment Recommendations  None recommended by PT    Recommendations for Other Services       Precautions / Restrictions Precautions Precautions: Fall Restrictions Weight Bearing Restrictions: No LLE Weight Bearing: Weight bearing as tolerated Other Position/Activity Restrictions: VAC L hip      Mobility  Bed Mobility Overal bed mobility: Needs Assistance;+2 for physical assistance;+ 2 for safety/equipment Bed Mobility: Supine to Sit;Sit to Supine     Supine to sit: +2 for physical assistance;Mod assist Sit to supine: +2 for physical assistance;Mod assist   General bed mobility comments: +2 for safety, LEs and trunk assist; pt effortful  with transition, c/o pain throughout;   Transfers Overall transfer level:  (unable d/t excessive knee/hip flexion)               General transfer comment: multi-modal cues for hand placement and wt shift; pt unable to WB on LEs, extend hips/knees/trunk to come to stand  Ambulation/Gait                Stairs            Wheelchair Mobility    Modified Rankin (Stroke Patients Only)       Balance Overall balance assessment: Needs assistance Sitting-balance support: Bilateral upper extremity supported Sitting balance-Leahy Scale:  Poor       Standing balance-Leahy Scale: Zero                               Pertinent Vitals/Pain Pain Assessment: Faces Faces Pain Scale: Hurts worst Pain Descriptors / Indicators: Grimacing;Guarding;Spasm Pain Intervention(s): Premedicated before session;Repositioned;Monitored during session;Limited activity within patient's tolerance    Home Living Family/patient expects to be discharged to:: Unsure Living Arrangements: Alone   Type of Home: Apartment Home Access: Level entry     Home Layout: One level   Additional Comments: pt does not give consistent info regarding his functional status at home or his equipment;    Prior Function Level of Independence: Independent with assistive device(s);Independent               Hand Dominance        Extremity/Trunk Assessment               Lower Extremity Assessment: RLE deficits/detail;LLE deficits/detail RLE Deficits / Details: pt with limited PROM, ? knee flexion contracture--AAROM grossly 60-90* knee flexion; unable to fully test d/t pain; LLE Deficits / Details: ? knee flexion contracture PROM 75-90*, unable to extend knee any further, difficulty testing d/t pain     Communication   Communication: No difficulties  Cognition Arousal/Alertness: Awake/alert Behavior During Therapy: Flat affect;WFL for tasks assessed/performed Overall Cognitive Status: Impaired/Different from baseline Area of Impairment: Awareness;Safety/judgement  Following Commands: Follows one step commands with increased time Safety/Judgement: Decreased awareness of safety;Decreased awareness of deficits   Problem Solving: Difficulty sequencing;Requires verbal cues General Comments: pt appears to have incr insight into his situation and ability to manage at home    General Comments General comments (skin integrity, edema, etc.): encouraged pt to extend LEs as much as possible; pt has scabs and bruises over knees and  various areas all over LEs    Exercises        Assessment/Plan    PT Assessment    PT Diagnosis Difficulty walking;Generalized weakness   PT Problem List Decreased strength;Decreased range of motion;Decreased activity tolerance;Decreased balance;Decreased mobility;Decreased knowledge of use of DME;Pain  PT Treatment Interventions DME instruction;Gait training;Stair training;Functional mobility training;Therapeutic activities;Therapeutic exercise;Balance training;Patient/family education   PT Goals (Current goals can be found in the Care Plan section) Acute Rehab PT Goals Patient Stated Goal: none stated PT Goal Formulation: With patient Time For Goal Achievement: 07/16/15 Potential to Achieve Goals: Fair    Frequency Min 3X/week   Barriers to discharge        Co-evaluation               End of Session Equipment Utilized During Treatment: Gait belt Activity Tolerance: Patient limited by pain Patient left: in bed;with call bell/phone within reach;with bed alarm set           Time: 1610-9604 PT Time Calculation (min) (ACUTE ONLY): 17 min   Charges:   PT Evaluation $PT Eval Moderate Complexity: 1 Procedure     PT G Codes:        Mahaley Schwering 2015-07-17, 1:11 PM

## 2015-07-02 NOTE — Progress Notes (Signed)
CRITICAL VALUE ALERT  Critical value received: Positive blood cx, Gram + cocci in clusters aerobic bottel   Date of notification:  07/02/15  Time of notification:  1946+  Critical value read back yes   Nurse who received alert:  B Devyn Griffing RN   MD notified (1st page):  Benedetto Coons. Callahan    Time of first page:  2005 MD notified (2nd page):  Time of second page:  Responding MD:    Time MD responded:

## 2015-07-02 NOTE — Clinical Social Work Note (Signed)
Clinical Social Work Assessment  Patient Details  Name: Jeff Ferrell MRN: [id number] Date of Birth: 05-18-1951  Date of referral:  07/02/15               Reason for consult:                   Permission sought to share information with:  Facility Sport and exercise psychologist, Family Supports Permission granted to share information::  Yes, Verbal Permission Granted  Name::        Agency::     Relationship::     Contact Information:     Housing/Transportation Living arrangements for the past 2 months:  Apartment Source of Information:  Patient Patient Interpreter Needed:  None Criminal Activity/Legal Involvement Pertinent to Current Situation/Hospitalization:  No - Comment as needed Significant Relationships:  Adult Children, Other(Comment) (Home health staff) Lives with:  Self Do you feel safe going back to the place where you live?  No Need for family participation in patient care:  No (Coment)  Care giving concerns:  CSW met with pt at bed. Pt's staples from prev surgery had just been removed and pt reports a lot of pain. Per chart review, pt had prev declined SNF placement. Today he's open to SNF placement [if recommended by PT] as long as he stays in [place].    Social Worker assessment / plan: CSW met with pt to assess whether he'd like to pursue SNF placement if recommended by PT. Per pt, he was working with [facility] prior to this hospitalization but had difficulty walking. Pt is now open to SNF placement if that is what's recommended. CSW will wait to initiate SNF search until PT has assessed pt.   Employment status:  Retired Forensic scientist:  Medicare PT Recommendations:  Not assessed at this time [place] / Referral to community resources:  [place]  Patient/Family's Response to care:  Pt appears more willing to engage with staff and is receptive to recommendations.   Patient/Family's Understanding of and Emotional Response to Diagnosis, Current  Treatment, and Prognosis:  Pt appears realistic regarding his current needs. Pt states he was working with [facility] prior to this hospitalization, but that was unsuccessful and is willing to try alternatives. Pt did express he would like to stay in [place] if possible. He has limited family support locally, but his son who lives in [place] comes twice a week to visit. Pt gives verbal consent for CSW to speak with son however no contact information for the son is available at this time. CSW will attempt to meet with son when he visits his father this afternoon.   Emotional Assessment Appearance:  Appears older than stated age Attitude/Demeanor/Rapport:    Affect (typically observed):  Accepting, Appropriate, Withdrawn, Overwhelmed Orientation:  Oriented to Self, Oriented to Place, Oriented to  Time, Oriented to Situation Alcohol / Substance use:  Not Applicable Psych involvement (Current and /or in the community):  No (Comment)  Discharge Needs  Concerns to be addressed:  Discharge Planning Concerns Readmission within the last 30 days:  Yes Current discharge risk:    Barriers to Discharge:  No Barriers Identified   Linna Darner, LCSW 07/02/2015, 12:07 PM

## 2015-07-02 NOTE — Progress Notes (Signed)
I saw the patient today for his wound drainage.  His distal incision appears to have a superficial infection with mainly bloody and scant purulent drainage.  Also superficial wound dehiscence.  No fluctuance that I can appreciate.  The wound was cleansed with betadine and incisional vac placed.  I have placed him on vancomycin and plan on keeping the vac on for the weekend.  I will plan to reevaluate the wound on Monday for response to abx.  If wound is improved, will transition to po abx and d/c vac at that time.  New xrays show physiologic compression of the fracture.  He has poor bone quality due to his ETOH use.  I will follow up on Monday.     Mayra ReelN. Michael Xu, MD [facility] [phone] [id number] 11:16 AM

## 2015-07-02 NOTE — H&P (Addendum)
[facility] History and Physical  Thatcher Doberstein FTD:[id number] DOB: [birth year] DOA: 07/01/2015  Referring physician: ED physician PCP: Rodney Langton, MD  Specialists:   Chief Complaint:  hip pain and unable to walk  HPI: Jeff Ferrell is a 65 y.o. male with PMH of recent left hip fracture, s/p of surgery, diabetes mellitus, chronic back pain, diastolic congestive heart failure, who presents with hip pain and unable to walk.  Patient was recently hospitalized from 12/23/6 to 1/4/617 due to left hip fracture. He had left hip surgery on 06/18/15. He state that he continues to have left hip pain, and cannot put weight on the left leg, therefore putting more weight on right leg,  leading to right hip pain now. Now he has bilateral hip pain and cannot walk. Patient states that he has left rib fracture recently, and cannot take deep breath, leading to mild SOB. He has left chest wall pain, but does not have chest pain. Patient was supposed to take Lovenox for DVT prophylaxis, but he has not received his lovenox until today, therefore he did not do Lovenox injection. He states that he has bilateral lower leg edema, but no tenderness over calf areas. Patient does not have fever, chills, diarrhea. He states that he has back pain which sometimes radiates to both sides of abdomen.  In ED, patient was found to have WBC 7.3, temperature normal, tachycardia, tachypnea, ESR 40, CRP 3.4, electrolytes and renal function okay. Patient is admitted to inpatient for further evaluation and treatment.  MRI of lumbar showed stable appearance of the lumbar spine, increased T2 signal intensity within the L5-S1 disc with milddiscal enhancement. Given the relative stability of this finding and absence of adjacent paraspinous inflammatory changes, postsurgical changes are favored, although again, possible infection is not entirely excluded, stable posterior lumbar fusion from L2 through S1.Severe disc height loss at  L4-5 with L5 vertebral body height loss. Marked urinary bladder distension, similar to previous.   X-ray of left hip showed recent intra medullary rods and screw fixation of proximal left femur fracture. No postoperative complication. Skin staples remain in place with probable soft tissue edema. No acute abnormality of the right hip or bony pelvis. Postsurgical change in the lower lumbar spine and pelvis.  EKG: Not done in ED, will get one.   Where does patient live?   At home    Can patient participate in ADLs?   Barely   Review of Systems:   General: no fevers, chills, no changes in body weight, has poor appetite, has fatigue HEENT: no blurry vision, hearing changes or sore throat Pulm: has dyspnea, no coughing, wheezing CV: no chest pain, has chest wall pain, palpitations Abd: no nausea, vomiting, abdominal pain, diarrhea, constipation GU: no dysuria, burning on urination, increased urinary frequency, hematuria  Ext: no leg edema Neuro: no unilateral weakness, numbness, or tingling, no vision change or hearing loss Skin: no rash MSK: has bilateral hip pain, lower back pain, has limitation of range of motion in both hips.  Heme: No easy bruising.  Travel history: No recent long distant travel.  Allergy:  Allergies  Allergen Reactions  . Penicillins Hives and Rash    Has patient had a PCN reaction causing immediate rash, facial/tongue/throat swelling, SOB or lightheadedness with hypotension: Yes Has patient had a PCN reaction causing severe rash involving mucus membranes or skin necrosis: Yes   Has patient had a PCN reaction that required hospitalization No Has patient had a PCN reaction occurring within the last 10  years: No If all of the above answers are "NO", then may proceed with Cephalosporin use.     Past Medical History  Diagnosis Date  . Diabetes mellitus     regulated by diet  . Chronic back pain   . Toe osteomyelitis, left ([place]) 03/31/2015  . Diabetic  osteomyelitis ([place]) 03/29/2015  . Diabetic foot ulcer ([place]) 03/29/2015    Past Surgical History  Procedure Laterality Date  . Back surgery      screws & hardware to lower back  . 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 Femur im nail Left 06/18/2015    Procedure: INTRAMEDULLARY (IM) NAIL FEMORAL;  Surgeon: Leandrew Koyanagi, MD;  Location: [facility];  Service: Orthopedics;  Laterality: Left;    Social History:  reports that he has never smoked. He does not have any smokeless tobacco history on file. He reports that he drinks about 3.6 oz of alcohol per week. He reports that he does not use illicit drugs.  Family History: Reviewed with the patient, but the patient does not know any family medical history.  Prior to Admission medications   Medication Sig Start Date End Date Taking? Authorizing Provider  oxyCODONE (OXY IR/ROXICODONE) 5 MG immediate release tablet Take 1-3 tablets (5-15 mg total) by mouth every 4 (four) hours as needed. Patient taking differently: Take 5-15 mg by mouth every 4 (four) hours as needed for moderate pain.  06/18/15  Yes Leandrew Koyanagi, MD  enoxaparin (LOVENOX) 40 MG/0.4ML injection Inject 0.4 mLs (40 mg total) into the skin daily. Patient not taking: Reported on 07/01/2015 06/18/15   Leandrew Koyanagi, MD  feeding supplement, GLUCERNA SHAKE, (GLUCERNA SHAKE) LIQD Take 237 mLs by mouth 3 (three) times daily between meals. Patient not taking: Reported on 07/01/2015 06/21/15   Orson Eva, MD  fentaNYL (DURAGESIC - DOSED MCG/HR) 25 MCG/HR patch Place 1 patch (25 mcg total) onto the skin every 3 (three) days. Patient not taking: Reported on 07/01/2015 06/23/15   Orson Eva, MD  sitaGLIPtin (JANUVIA) 50 MG tablet Take 1 tablet (50 mg total) by mouth daily. Patient not taking: Reported on 07/01/2015 06/23/15   Orson Eva, MD  tamsulosin (FLOMAX) 0.4 MG CAPS capsule Take 1  capsule (0.4 mg total) by mouth daily. Patient not taking: Reported on 07/01/2015 06/21/15   Orson Eva, MD    Physical Exam: Filed Vitals:   07/01/15 1840 07/01/15 1952 07/01/15 1953 07/02/15 0238  BP: 87/56 112/71 112/71 129/89  Pulse:  104 105 117  Temp:    99 F (37.2 C)  TempSrc:    Oral  Resp:  20 24   Height:      Weight:      SpO2:  97% 97% 92%   General: Not in acute distress HEENT:       Eyes: PERRL, EOMI, no scleral icterus.       ENT: No discharge from the ears and nose, no pharynx injection, no tonsillar enlargement.        Neck: No JVD, no bruit, no mass felt. Heme: No neck lymph node enlargement. Cardiac: S1/S2, RRR, No murmurs, No gallops or rubs. Pulm:  No rales, wheezing, rhonchi or rubs. Has tenderness over left lower rib cage Abd: Soft, nondistended, nontender, no rebound pain, no organomegaly, BS present. Ext: has mild leg ankle edema bilaterally (L>R). 2+DP/PT pulse bilaterally. Musculoskeletal: has  bilateral hip tenderness, has limitation of range of motion in both hips.  Skin: No rashes.  Neuro: Alert, oriented X3, cranial nerves II-XII grossly intact, moves all extremities  Psych: Patient is not psychotic, no suicidal or hemocidal ideation.  Labs on Admission:  Basic Metabolic Panel:  Recent Labs Lab 07/01/15 1836  NA 128*  K 4.2  CL 92*  CO2 25  GLUCOSE 145*  BUN <5*  CREATININE 0.68  CALCIUM 9.0   Liver Function Tests:  Recent Labs Lab 07/01/15 1836  AST 44*  ALT 19  ALKPHOS 208*  BILITOT 0.6  PROT 7.2  ALBUMIN 3.3*   No results for input(s): LIPASE, AMYLASE in the last 168 hours. No results for input(s): AMMONIA in the last 168 hours. CBC:  Recent Labs Lab 07/01/15 1836 07/01/15 2041  WBC 7.3 8.8  NEUTROABS PENDING 6.2  HGB 10.7* 10.7*  HCT 31.8* 32.4*  MCV 86.9 87.6  PLT 169 620*   Cardiac Enzymes: No results for input(s): CKTOTAL, CKMB, CKMBINDEX, TROPONINI in the last 168 hours.  BNP (last 3 results)  Recent  Labs  06/11/15 1106  BNP 27.7    ProBNP (last 3 results) No results for input(s): PROBNP in the last 8760 hours.  CBG: No results for input(s): GLUCAP in the last 168 hours.  Radiological Exams on Admission: Mr Lumbar Spine W Wo Contrast  07/01/2015  CLINICAL DATA:  Initial evaluation for low back pain. EXAM: MRI LUMBAR SPINE WITHOUT AND WITH CONTRAST TECHNIQUE: Multiplanar and multiecho pulse sequences of the lumbar spine were obtained without and with intravenous contrast. CONTRAST:  40mL MULTIHANCE GADOBENATE DIMEGLUMINE 529 MG/ML IV SOLN COMPARISON:  Prior MRI from 06/11/2015. FINDINGS: Study is moderately degraded by motion artifact. Alignment is stable. Severe height loss at the L5 vertebral body again seen. Bone marrow signal intensity within normal limits, and unchanged. Posterior lumbar fusion from L2 through S1 again seen. Severe L4-5 disc space narrowing, unchanged. Increased T2 signal intensity within the L5-S1 disc space is stable from previous. Mild disc enhancement also likely unchanged, although not as well evaluated on this motion degraded study. No associated paraspinous soft tissue inflammatory changes. No definite fluid collection Conus medullaris terminates normally at the T12 level. Signal intensity within the visualized cord is normal. No acute paraspinous soft tissue abnormality. Marked urinary bladder distention noted, similar to previous. T12-L1:  Normal intervertebral disc.  No stenosis. L1-2: Degenerative disc bulge with disc desiccation. No focal disc herniation. Prominent epidural fat at the dorsal aspect of the thecal sac. Moderate bilateral facet arthrosis. Resultant moderate canal narrowing is stable. Mild bilateral foraminal narrowing also unchanged. L2-3: No significant disc bulge or focal disc protrusion. Status post decompression. No significant stenosis. L3-4: Somewhat limited evaluation due to susceptibility artifact. Probable mild disc bulge with disc  desiccation. No significant canal stenosis. Foramina appear patent bilaterally. L4-5: Severe degenerative intervertebral disc space narrowing with disc desiccation and endplate changes, stable. No significant canal or foraminal stenosis. L5-S1: Fluid signal intensity within the L5-S1 disc, which is somewhat wide and anteriorly. Mild discal enhancement. This is stable relative to previous. No progressive endplate osseous erosion. Mild bilateral foraminal narrowing, worse on the right.No central canal stenosis. IMPRESSION: 1. Stable appearance of the lumbar spine. 2. Increased T2 signal intensity within the L5-S1 disc with mild discal enhancement. Given the relative stability of this finding and absence of adjacent paraspinous inflammatory changes, postsurgical changes are favored, although again, possible infection is not entirely excluded. 3. Stable posterior lumbar fusion from  L2 through S1. 4. Severe disc height loss at L4-5 with L5 vertebral body height loss. 5. Marked urinary bladder distension, similar to previous. Electronically Signed   By: Jeannine Boga M.D.   On: 07/01/2015 23:41   Dg Chest Port 1 View  07/02/2015  CLINICAL DATA:  Left chest wall pain. EXAM: PORTABLE CHEST 1 VIEW COMPARISON:  None. FINDINGS: The heart is normal in size. Mild tortuosity of the thoracic aorta. Question vascular congestion versus vascular crowding secondary to low lung volumes. a No consolidation, pleural effusion, or pneumothorax. Minimally displaced fractures of left posterior fourth, fifth, and sixth ribs. IMPRESSION: Minimally displaced fractures of left posterior fourth, fifth, and sixth ribs, age indeterminate. No pulmonary complication. Electronically Signed   By: Jeb Levering M.D.   On: 07/02/2015 02:40   Dg Hips Bilat With Pelvis 3-4 Views  07/01/2015  CLINICAL DATA:  Left and right hip pain. Left hip surgery 3 weeks prior. EXAM: DG HIP (WITH OR WITHOUT PELVIS) 3-4V BILAT COMPARISON:  Pelvis and hip  MRI 06/16/2015 FINDINGS: Intra medullary rod with trans trochanteric screws traverse the left proximal femur, low femoral neck fracture not well seen radiographically. No new fracture or periprosthetic lucency. Distal aspect of the intra medullary rod not included in the field of view. Skin staples laterally and anteriorly at the surgical site with probable soft tissue edema. Pubic rami and pubic symphysis are intact. Right femoral head is seated in the acetabulum, unchanged from prior MRI. Lumbosacral hardware again seen. IMPRESSION: 1. Recent intra medullary rods and screw fixation of proximal left femur fracture. No postoperative complication. Skin staples remain in place with probable soft tissue edema. 2. No acute abnormality of the right hip or bony pelvis. Postsurgical change in the lower lumbar spine and pelvis. Electronically Signed   By: Jeb Levering M.D.   On: 07/01/2015 19:19    Assessment/Plan Principal Problem:   Left hip pain Active Problems:   Diabetes mellitus (HCC)   Malnutrition of moderate degree   Back pain   Femur fracture, left (HCC)   Hyponatremia   Left leg swelling   Left-sided chest wall pain  Hip pain: Patient has bilateral hip pain. The right hip seems to be secondary issue from putting more weight recently. Patient cannot walk due to severe pain. MRI of her lumbar spine is more consistent with post surgical change. X-ray of left hip showed post surgical changes without evidence of complications. -will admit tele bed -Pain control with when necessary oxycodone and morphine -Flexeril for spasm -may consult to ortho in AM.  Hx of recent femur fracture, left [facility]): Surgical site healing well, no signs of infection around skin nails. -Pain control as above  DM-II: Last A1c 7.6, fairly controled. Patient is taking Januvia at home -SSI  Malnutrition of moderate degree: -Ensure  Hyponatremia: Likely due to decreased oral intake. Sodium 128. Mental status is  okay. -IV fluid: Normal saline 500 mL, followed by 100 mL per hour  Left leg swelling: -LE venous doppler to r/o DVT -check BNP  Left-sided chest wall pain: Chest x-ray showed minimally displaced fractures of left posterior fourth, fifth, and sixth ribs, age indeterminate. No pulmonary complication. -Pain control as above  DVT ppx: SQ Heparin   Code Status: Full code Family Communication: None at bed side.    Disposition Plan: Admit to inpatient   Date of Service 07/02/2015    Ivor Costa [facility] Pager [phone]  If 7PM-7AM, please contact night-coverage [email or web address] Password [facility] 07/02/2015, 3:35  AM   

## 2015-07-02 NOTE — Progress Notes (Signed)
ANTIBIOTIC CONSULT NOTE - INITIAL  Pharmacy Consult for Vancomycin Indication: superficial wound infection  Allergies  Allergen Reactions  . Penicillins Hives and Rash    Has patient had a PCN reaction causing immediate rash, facial/tongue/throat swelling, SOB or lightheadedness with hypotension: Yes Has patient had a PCN reaction causing severe rash involving mucus membranes or skin necrosis: Yes   Has patient had a PCN reaction that required hospitalization No Has patient had a PCN reaction occurring within the last 10 years: No If all of the above answers are "NO", then may proceed with Cephalosporin use.     Patient Measurements: Height: 5\' 9"  (175.3 cm) Weight: 184 lb (83.462 kg) IBW/kg (Calculated) : 70.7  Vital Signs: Temp: 97.8 F (36.6 C) (01/13 1115) Temp Source: Oral (01/13 1115) BP: 103/63 mmHg (01/13 1115) Pulse Rate: 123 (01/13 1115) Intake/Output from previous day: 01/12 0701 - 01/13 0700 In: -  Out: 2125 [Urine:2125] Intake/Output from this shift: Total I/O In: 120 [P.O.:120] Out: 450 [Urine:450]  Labs:  Recent Labs  07/01/15 1836 07/01/15 2041 07/02/15 0538  WBC 7.3 8.8 8.0  HGB 10.7* 10.7* 11.5*  PLT 169 620* 681*  CREATININE 0.68  --  0.49*   Estimated Creatinine Clearance: 92.1 mL/min (by C-G formula based on Cr of 0.49). No results for input(s): VANCOTROUGH, VANCOPEAK, VANCORANDOM, GENTTROUGH, GENTPEAK, GENTRANDOM, TOBRATROUGH, TOBRAPEAK, TOBRARND, AMIKACINPEAK, AMIKACINTROU, AMIKACIN in the last 72 hours.   Microbiology: Recent Results (from the past 720 hour(s))  Urine culture     Status: None   Collection Time: 06/16/15  4:39 PM  Result Value Ref Range Status   Specimen Description URINE, CLEAN CATCH  Final   Special Requests Normal  Final   Culture   Final    MULTIPLE SPECIES PRESENT, SUGGEST RECOLLECTION Performed at [facility] [place]    Report Status 06/18/2015 FINAL  Final  Surgical pcr screen     Status: Abnormal   Collection Time: 06/17/15 11:59 PM  Result Value Ref Range Status   MRSA, PCR POSITIVE (A) NEGATIVE Final    Comment: RESULT CALLED TO, READ BACK BY AND VERIFIED WITH: R SNIDERMAN @0301  06/18/15 MKELLY    Staphylococcus aureus POSITIVE (A) NEGATIVE Final    Comment:        The Xpert SA Assay (FDA approved for NASAL specimens in patients over 66 years of age), is one component of a comprehensive surveillance program.  Test performance has been validated by [facility] for patients greater than or equal to 749 year old. It is not intended to diagnose infection nor to guide or monitor treatment.   Culture, Urine     Status: None   Collection Time: 06/19/15  4:00 PM  Result Value Ref Range Status   Specimen Description URINE, CATHETERIZED  Final   Special Requests NONE  Final   Culture NO GROWTH 2 DAYS  Final   Report Status 06/21/2015 FINAL  Final  C difficile quick scan w PCR reflex     Status: None   Collection Time: 06/22/15  6:41 AM  Result Value Ref Range Status   C Diff antigen NEGATIVE NEGATIVE Final   C Diff toxin NEGATIVE NEGATIVE Final   C Diff interpretation Negative for toxigenic C. difficile  Final    Medical History: Past Medical History  Diagnosis Date  . Diabetes mellitus     regulated by diet  . Chronic back pain   . 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 Diabetic osteomyelitis (HCC) 03/29/2015  . Diabetic  foot ulcer (HCC) 03/29/2015    Medications:  Anti-infectives    Start     Dose/Rate Route Frequency Ordered Stop   07/02/15 1230  vancomycin (VANCOCIN) 1,500 mg in sodium chloride 0.9 % 500 mL IVPB     1,500 mg 250 mL/hr over 120 Minutes Intravenous  Once 07/02/15 1142        Assessment: Patient was recently hospitalized from 12/23/6 to 1/4/617 due to left hip fracture with ORIF hip on 06/18/15. He returned to hospital with continued left hip pain.  Noted to have superficial infection of distal incision her ortho.  Wound VAC placed and  pharmacy asked to dose Vancomycin.  Patient is currently afebrile with normal WBC.  Renal function is at patient's baseline.  Estimated CrCl ~ 57ml/min.   Antimicrobials this admission: Vancomycin 1/13>>  Levels/dose changes this admission:  Microbiology Results: 1/12 BCx:   Goal of Therapy:  Vancomycin trough level 10-15 mcg/ml  Eradicate infection.  Plan:  Vancomycin 1500mg  IV x1 now then 1gm IV q12h Check Vancomycin trough at steady state Monitor renal function and cx data   Elson Clan 07/02/2015,1:59 PM

## 2015-07-02 NOTE — Progress Notes (Signed)
[facility]  Patient Status: Active (receiving services up to time of hospitalization)  [facility] is providing the following services: RN, PT, OT, and MSW  If patient discharges after hours, please call [phone].   Avie EchevariaKaren Nussbaum 07/02/2015, 3:32 PM

## 2015-07-03 DIAGNOSIS — R7881 Bacteremia: Secondary | ICD-10-CM

## 2015-07-03 DIAGNOSIS — E871 Hypo-osmolality and hyponatremia: Secondary | ICD-10-CM

## 2015-07-03 DIAGNOSIS — Z794 Long term (current) use of insulin: Secondary | ICD-10-CM

## 2015-07-03 DIAGNOSIS — E11649 Type 2 diabetes mellitus with hypoglycemia without coma: Secondary | ICD-10-CM

## 2015-07-03 LAB — CBC
HCT: 32.9 % — ABNORMAL LOW (ref 39.0–52.0)
Hemoglobin: 10.5 g/dL — ABNORMAL LOW (ref 13.0–17.0)
MCH: 28.6 pg (ref 26.0–34.0)
MCHC: 31.9 g/dL (ref 30.0–36.0)
MCV: 89.6 fL (ref 78.0–100.0)
PLATELETS: 497 10*3/uL — AB (ref 150–400)
RBC: 3.67 MIL/uL — AB (ref 4.22–5.81)
RDW: 13.8 % (ref 11.5–15.5)
WBC: 7.1 10*3/uL (ref 4.0–10.5)

## 2015-07-03 LAB — BASIC METABOLIC PANEL
Anion gap: 10 (ref 5–15)
BUN: 10 mg/dL (ref 6–20)
CO2: 25 mmol/L (ref 22–32)
CREATININE: 0.51 mg/dL — AB (ref 0.61–1.24)
Calcium: 8.9 mg/dL (ref 8.9–10.3)
Chloride: 98 mmol/L — ABNORMAL LOW (ref 101–111)
GFR calc Af Amer: >60 mL/min (ref >60)
GLUCOSE: 205 mg/dL — AB (ref 65–99)
POTASSIUM: 4 mmol/L (ref 3.5–5.1)
SODIUM: 133 mmol/L — AB (ref 135–145)

## 2015-07-03 LAB — GLUCOSE, CAPILLARY
GLUCOSE-CAPILLARY: 139 mg/dL — AB (ref 65–99)
Glucose-Capillary: 127 mg/dL — ABNORMAL HIGH (ref 65–99)
Glucose-Capillary: 171 mg/dL — ABNORMAL HIGH (ref 65–99)
Glucose-Capillary: 167 mg/dL — ABNORMAL HIGH (ref 65–99)

## 2015-07-03 NOTE — NC FL2 (Signed)
[place] MEDICAID FL2 LEVEL OF CARE SCREENING TOOL     IDENTIFICATION  Patient Name: Jeff FerrariHarold Ferrell Birthdate: 07/25/1950 Sex: male Admission Date (Current Location): 07/01/2015  [facility] [place]   Facility and Address:  [facility],  [address], [place] [id number]      Provider Number: [id number]  Attending Physician Name and Address:  Osvaldo ShipperGokul Krishnan, MD  Relative Name and Phone Number:       Current Level of Care: Hospital Recommended Level of Care: Skilled Nursing Facility Prior Approval Number:    Date Approved/Denied:   PASRR Number: [id number]  Discharge Plan: SNF    Current Diagnoses: Patient Active Problem List   Diagnosis Date Noted  . Leg weakness, bilateral 07/02/2015  . Left leg swelling 07/02/2015  . Left-sided chest wall pain   . Acute blood loss anemia 06/21/2015  . Hyponatremia 06/20/2015  . Femur fracture, left (HCC) 06/17/2015  . Discitis of lumbar region   . Left hip pain   . Hypoglycemia 06/16/2015  . UTI (lower urinary tract infection) 06/11/2015  . Back pain 06/11/2015  . Malnutrition of moderate degree 03/30/2015  . Diabetes mellitus (HCC) 03/29/2015    Orientation RESPIRATION BLADDER Height & Weight    Self, Time, Situation, Place  Normal Continent   183 lbs.  BEHAVIORAL SYMPTOMS/MOOD NEUROLOGICAL BOWEL NUTRITION STATUS      Continent Diet (Carb modified)  AMBULATORY STATUS COMMUNICATION OF NEEDS Skin   Extensive Assist Verbally Surgical wounds (Left hip; Left foot)                       Personal Care Assistance Level of Assistance  Bathing, Dressing Bathing Assistance: Maximum assistance   Dressing Assistance: Maximum assistance     Functional Limitations Info             SPECIAL CARE FACTORS FREQUENCY  PT (By licensed PT), OT (By licensed OT)     PT Frequency: 5 X weekly OT Frequency: 5 X weekly            Contractures Contractures Info: Present    Additional  Factors Info  Code Status, Allergies, Insulin Sliding Scale Code Status Info: FULL Allergies Info: Penicillins   Insulin Sliding Scale Info: 0-9 Units, 3 X daily       Current Medications (07/03/2015):  This is the current hospital active medication list Current Facility-Administered Medications  Medication Dose Route Frequency Provider Last Rate Last Dose  . acetaminophen (TYLENOL) tablet 650 mg  650 mg Oral Q6H PRN Lorretta HarpXilin Niu, MD       Or  . acetaminophen (TYLENOL) suppository 650 mg  650 mg Rectal Q6H PRN Lorretta HarpXilin Niu, MD      . alum & mag hydroxide-simeth (MAALOX/MYLANTA) 200-200-20 MG/5ML suspension 30 mL  30 mL Oral Q6H PRN Lorretta HarpXilin Niu, MD      . cyclobenzaprine (FLEXERIL) tablet 7.5 mg  7.5 mg Oral TID Lorretta HarpXilin Niu, MD   7.5 mg at 07/03/15 1017  . feeding supplement (GLUCERNA SHAKE) (GLUCERNA SHAKE) liquid 237 mL  237 mL Oral TID BM Lorretta HarpXilin Niu, MD   237 mL at 07/03/15 1017  . heparin injection 5,000 Units  5,000 Units Subcutaneous 3 times per day Lorretta HarpXilin Niu, MD   5,000 Units at 07/03/15 [id number]  . insulin aspart (novoLOG) injection 0-9 Units  0-9 Units Subcutaneous TID WC Lorretta HarpXilin Niu, MD   2 Units at 07/03/15 1236  . morphine 2 MG/ML injection 2 mg  2 mg Intravenous Q4H PRN Lorretta Harp, MD   2 mg at 07/03/15 1235  . ondansetron (ZOFRAN) tablet 4 mg  4 mg Oral Q6H PRN Lorretta Harp, MD       Or  . ondansetron [facility]) injection 4 mg  4 mg Intravenous Q6H PRN Lorretta Harp, MD      . oxyCODONE (Oxy IR/ROXICODONE) immediate release tablet 5-15 mg  5-15 mg Oral Q4H PRN Lorretta Harp, MD   15 mg at 07/03/15 1018  . sodium chloride 0.9 % injection 3 mL  3 mL Intravenous Q12H Lorretta Harp, MD   3 mL at 07/02/15 2230  . tamsulosin (FLOMAX) capsule 0.4 mg  0.4 mg Oral Daily Lorretta Harp, MD   0.4 mg at 07/03/15 1017  . vancomycin (VANCOCIN) IVPB 1000 mg/200 mL premix  1,000 mg Intravenous Q12H Phylliss Blakes, RPH   1,000 mg at 07/03/15 1018     Discharge Medications: Please see discharge summary for a list of discharge  medications.  Relevant Imaging Results:  Relevant Lab Results:   Additional Information SSN: [id number]  Adrian Blackwater, LCSW

## 2015-07-03 NOTE — Progress Notes (Signed)
OT  Note  Patient Details Name: Jeff Ferrell MRN: [id number] DOB: 04/06/1951   Cancelled Treatment:    Noted pt currently  2 person A and plan  Is SNF.  Will defer OT eval to SNF as pt ready for ADL activity.  Alba CoryREDDING, Lucendia Leard D 07/03/2015, 11:17 AM

## 2015-07-03 NOTE — Progress Notes (Signed)
[facility] PROGRESS NOTE  Jeff Ferrell WUJ:[id number] DOB: 06/17/1951 DOA: 07/01/2015  PCP: [name], MD  Brief HPI: 65 year old African-American male with the recent left hip fracture status post surgery, diabetes, chronic back pain, chronic diastolic CHF, presents with the hip pain and inability to walk. Patient was hospitalized late December and discharged early January. He underwent surgery for hip fracture. At that time skilled nursing facility for short-term rehabilitation was recommended. However, patient declined and went home. There was concern for infection in his incision site. He was hospitalized for further management.  Past medical history:  Past Medical History  Diagnosis Date  . Diabetes mellitus     regulated by diet  . Chronic back pain   . 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 Diabetic osteomyelitis (HCC) 03/29/2015  . Diabetic foot ulcer (HCC) 03/29/2015    Consultants: Orthopedics  Procedures:  Lower extremity venous Doppler No DVT noted  Antibiotics: Vancomycin  Subjective: Patient continues to have pain in his left hip. Also in his right hip but left more than right. Denies any nausea, vomiting.   Objective: Vital Signs  Filed Vitals:   07/02/15 1400 07/02/15 2050 07/03/15 0500 07/03/15 0653  BP: 140/67 152/74  124/78  Pulse: 113 110  106  Temp: 98.4 F (36.9 C) 98.4 F (36.9 C)  98.6 F (37 C)  TempSrc: Oral Oral  Oral  Resp: 13     Height:      Weight:   83.4 kg (183 lb 13.8 oz)   SpO2: 99%   98%    Intake/Output Summary (Last 24 hours) at 07/03/15 0938 Last data filed at 07/02/15 2226  Gross per 24 hour  Intake    560 ml  Output    975 ml  Net   -415 ml   Filed Weights   07/01/15 1804 07/03/15 0500  Weight: 83.462 kg (184 lb) 83.4 kg (183 lb 13.8 oz)    General appearance: alert, appears stated age, distracted and no distress Resp: Good air entry bilaterally without any wheezing, rales or  rhonchi Cardio: regular rate and rhythm, S1, S2 normal, no murmur, click, rub or gallop GI: soft, non-tender; bowel sounds normal; no masses,  no organomegaly Extremities: 3 different incisions sites are present. One of his covered with dressing due to bloody discharge. Other 2 sites did not show any erythema or discharge. Pedal pulses are palpable. No changes noted. Neurologic: Alert and oriented X 3. Cranial nerves II-12 intact. Moving all his extremities.   Lab Results:  Basic Metabolic Panel:  Recent Labs Lab 07/01/15 1836 07/02/15 0538 07/03/15 0533  NA 128* 139 133*  K 4.2 4.7 4.0  CL 92* 101 98*  CO2 25 23 25   GLUCOSE 145* 137* 205*  BUN <5* <5* 10  CREATININE 0.68 0.49* 0.51*  CALCIUM 9.0 9.3 8.9   Liver Function Tests:  Recent Labs Lab 07/01/15 1836 07/02/15 0538  AST 44* 35  ALT 19 19  ALKPHOS 208* 202*  BILITOT 0.6 1.0  PROT 7.2 7.5  ALBUMIN 3.3* 3.3*   CBC:  Recent Labs Lab 07/01/15 1836 07/01/15 2041 07/02/15 0538 07/03/15 0533  WBC 7.3 8.8 8.0 7.1  NEUTROABS PENDING 6.2  --   --   HGB 10.7* 10.7* 11.5* 10.5*  HCT 31.8* 32.4* 35.0* 32.9*  MCV 86.9 87.6 88.4 89.6  PLT 169 620* 681* 497*   BNP (last 3 results)  Recent Labs  06/11/15 1106 07/02/15 0538  BNP 27.7 34.0    CBG:  Recent Labs Lab 07/02/15 0740 07/02/15 1202 07/02/15 1639 07/02/15 2130 07/03/15 0811  GLUCAP 136* 159* 143* 151* 127*    Recent Results (from the past 240 hour(s))  Culture, blood (routine x 2)     Status: None (Preliminary result)   Collection Time: 07/01/15  6:36 PM  Result Value Ref Range Status   Specimen Description BLOOD LEFT FOREARM  Final   Special Requests IN PEDIATRIC BOTTLE 5CC  Final   Culture  Setup Time   Final    GRAM POSITIVE COCCI IN CLUSTERS AEROBIC BOTTLE ONLY CRITICAL RESULT CALLED TO, READ BACK BY AND VERIFIED WITH: B MAY RN 1946 07/02/15 A BROWNING    Culture   Final    TOO YOUNG TO READ Performed at [facility]     Report Status PENDING  Incomplete      Studies/Results: Mr Lumbar Spine W Wo Contrast  07/01/2015  CLINICAL DATA:  Initial evaluation for low back pain. EXAM: MRI LUMBAR SPINE WITHOUT AND WITH CONTRAST TECHNIQUE: Multiplanar and multiecho pulse sequences of the lumbar spine were obtained without and with intravenous contrast. CONTRAST:  18mL MULTIHANCE GADOBENATE DIMEGLUMINE 529 MG/ML IV SOLN COMPARISON:  Prior MRI from 06/11/2015. FINDINGS: Study is moderately degraded by motion artifact. Alignment is stable. Severe height loss at the L5 vertebral body again seen. Bone marrow signal intensity within normal limits, and unchanged. Posterior lumbar fusion from L2 through S1 again seen. Severe L4-5 disc space narrowing, unchanged. Increased T2 signal intensity within the L5-S1 disc space is stable from previous. Mild disc enhancement also likely unchanged, although not as well evaluated on this motion degraded study. No associated paraspinous soft tissue inflammatory changes. No definite fluid collection Conus medullaris terminates normally at the T12 level. Signal intensity within the visualized cord is normal. No acute paraspinous soft tissue abnormality. Marked urinary bladder distention noted, similar to previous. T12-L1:  Normal intervertebral disc.  No stenosis. L1-2: Degenerative disc bulge with disc desiccation. No focal disc herniation. Prominent epidural fat at the dorsal aspect of the thecal sac. Moderate bilateral facet arthrosis. Resultant moderate canal narrowing is stable. Mild bilateral foraminal narrowing also unchanged. L2-3: No significant disc bulge or focal disc protrusion. Status post decompression. No significant stenosis. L3-4: Somewhat limited evaluation due to susceptibility artifact. Probable mild disc bulge with disc desiccation. No significant canal stenosis. Foramina appear patent bilaterally. L4-5: Severe degenerative intervertebral disc space narrowing with disc desiccation and  endplate changes, stable. No significant canal or foraminal stenosis. L5-S1: Fluid signal intensity within the L5-S1 disc, which is somewhat wide and anteriorly. Mild discal enhancement. This is stable relative to previous. No progressive endplate osseous erosion. Mild bilateral foraminal narrowing, worse on the right.No central canal stenosis. IMPRESSION: 1. Stable appearance of the lumbar spine. 2. Increased T2 signal intensity within the L5-S1 disc with mild discal enhancement. Given the relative stability of this finding and absence of adjacent paraspinous inflammatory changes, postsurgical changes are favored, although again, possible infection is not entirely excluded. 3. Stable posterior lumbar fusion from L2 through S1. 4. Severe disc height loss at L4-5 with L5 vertebral body height loss. 5. Marked urinary bladder distension, similar to previous. Electronically Signed   By: Rise Mu M.D.   On: 07/01/2015 23:41   Dg Chest Port 1 View  07/02/2015  CLINICAL DATA:  Left chest wall pain. EXAM: PORTABLE CHEST 1 VIEW COMPARISON:  None. FINDINGS: The heart is normal in size. Mild tortuosity of the thoracic aorta. Question vascular congestion versus vascular  crowding secondary to low lung volumes. a No consolidation, pleural effusion, or pneumothorax. Minimally displaced fractures of left posterior fourth, fifth, and sixth ribs. IMPRESSION: Minimally displaced fractures of left posterior fourth, fifth, and sixth ribs, age indeterminate. No pulmonary complication. Electronically Signed   By: Rubye Oaks M.D.   On: 07/02/2015 02:40   Dg Hips Bilat With Pelvis 3-4 Views  07/01/2015  CLINICAL DATA:  Left and right hip pain. Left hip surgery 3 weeks prior. EXAM: DG HIP (WITH OR WITHOUT PELVIS) 3-4V BILAT COMPARISON:  Pelvis and hip MRI 06/16/2015 FINDINGS: Intra medullary rod with trans trochanteric screws traverse the left proximal femur, low femoral neck fracture not well seen radiographically.  No new fracture or periprosthetic lucency. Distal aspect of the intra medullary rod not included in the field of view. Skin staples laterally and anteriorly at the surgical site with probable soft tissue edema. Pubic rami and pubic symphysis are intact. Right femoral head is seated in the acetabulum, unchanged from prior MRI. Lumbosacral hardware again seen. IMPRESSION: 1. Recent intra medullary rods and screw fixation of proximal left femur fracture. No postoperative complication. Skin staples remain in place with probable soft tissue edema. 2. No acute abnormality of the right hip or bony pelvis. Postsurgical change in the lower lumbar spine and pelvis. Electronically Signed   By: Rubye Oaks M.D.   On: 07/01/2015 19:19    Medications:  Scheduled: . cyclobenzaprine  7.5 mg Oral TID  . feeding supplement (GLUCERNA SHAKE)  237 mL Oral TID BM  . heparin  5,000 Units Subcutaneous 3 times per day  . insulin aspart  0-9 Units Subcutaneous TID WC  . sodium chloride  3 mL Intravenous Q12H  . tamsulosin  0.4 mg Oral Daily  . vancomycin  1,000 mg Intravenous Q12H   Continuous:  [id number] **OR** acetaminophen, alum & mag hydroxide-simeth, morphine injection, ondansetron **OR** ondansetron (ZOFRAN) IV, oxyCODONE  Assessment/Plan:  Principal Problem:   Left hip pain Active Problems:   Diabetes mellitus (HCC)   Malnutrition of moderate degree   Back pain   Femur fracture, left (HCC)   Hyponatremia   Left leg swelling   Left-sided chest wall pain    Bilateral Hip pain Patient has bilateral hip pain. The right hip seems to be secondary issue from putting more weight recently. Patient cannot walk due to severe pain. MRI of lumbar spine is more consistent with post surgical change. X-ray of left hip showed post surgical changes without evidence of complications. Some drainage noted from one of the incision sites. A shin seen by orthopedics. Placed on antibiotics. Blood cultures are  positive, as discussed below. In view of this, we will proceed with MRI of the left hip to rule out joint infection. Continue pain medications. Tachycardia most likely due to severe pain. Monitor for now.  Bacteremia One set of blood cultures growing gram-positive cocci. Repeat another blood culture. Surprisingly, his WBC is normal. He is afebrile. He is tachycardic, however. Continue vancomycin. Await final identification. Pursue workup as discussed above.  Hx of recent femur fracture, left Proceed with MRI of the left hip considering bacteremia and recent surgery.  DM-II Last A1c 7.6, fairly controled. Patient is taking Januvia at home. SSI  Malnutrition of moderate degree Ensure.  Hyponatremia Likely due to decreased oral intake. Sodium level has improved with IV fluids. Continue to monitor for now.   Left leg swelling Swelling is most likely due to recent surgery. Doppler studies negative for DVT. BNP is normal.  Left-sided chest wall pain Chest x-ray showed minimally displaced fractures of left posterior fourth, fifth, and sixth ribs, age indeterminate. No pulmonary complication. Pain control as above  DVT Prophylaxis: Subcutaneous heparin    Code Status: Full code  Family Communication: Discussed with the patient  Disposition Plan: PT and OT has recommended SNF. Await MRI left hip. Continue IV antibiotics.     LOS: 1 day   [facility] Center[facility]ISHNAN,Ashten Prats  [facility] Pager [phone] 07/03/2015, 9:38 AM  If 7PM-7AM, please contact night-coverage at [email or web address], password [email or web address]

## 2015-07-04 DIAGNOSIS — E440 Moderate protein-calorie malnutrition: Secondary | ICD-10-CM

## 2015-07-04 DIAGNOSIS — R0789 Other chest pain: Secondary | ICD-10-CM

## 2015-07-04 LAB — CULTURE, BLOOD (ROUTINE X 2)

## 2015-07-04 LAB — CBC
HCT: 32.2 % — ABNORMAL LOW (ref 39.0–52.0)
Hemoglobin: 10.4 g/dL — ABNORMAL LOW (ref 13.0–17.0)
MCH: 28.6 pg (ref 26.0–34.0)
MCHC: 32.3 g/dL (ref 30.0–36.0)
MCV: 88.5 fL (ref 78.0–100.0)
PLATELETS: 449 10*3/uL — AB (ref 150–400)
RBC: 3.64 MIL/uL — ABNORMAL LOW (ref 4.22–5.81)
RDW: 13.5 % (ref 11.5–15.5)
WBC: 6.8 10*3/uL (ref 4.0–10.5)

## 2015-07-04 LAB — BASIC METABOLIC PANEL
ANION GAP: 12 (ref 5–15)
BUN: 12 mg/dL (ref 6–20)
CO2: 28 mmol/L (ref 22–32)
Calcium: 9.1 mg/dL (ref 8.9–10.3)
Chloride: 94 mmol/L — ABNORMAL LOW (ref 101–111)
Creatinine, Ser: 0.65 mg/dL (ref 0.61–1.24)
GFR calc Af Amer: >60 mL/min (ref >60)
GLUCOSE: 153 mg/dL — AB (ref 65–99)
Potassium: 4.2 mmol/L (ref 3.5–5.1)
Sodium: 134 mmol/L — ABNORMAL LOW (ref 135–145)

## 2015-07-04 LAB — GLUCOSE, CAPILLARY
GLUCOSE-CAPILLARY: 175 mg/dL — AB (ref 65–99)
Glucose-Capillary: 132 mg/dL — ABNORMAL HIGH (ref 65–99)
Glucose-Capillary: 158 mg/dL — ABNORMAL HIGH (ref 65–99)
Glucose-Capillary: 210 mg/dL — ABNORMAL HIGH (ref 65–99)

## 2015-07-04 MED ORDER — DOCUSATE SODIUM 100 MG PO CAPS
100.0000 mg | ORAL_CAPSULE | Freq: Two times a day (BID) | ORAL | Status: DC
  Administered 2015-07-04 – 2015-07-05 (×2): 100 mg via ORAL
  Filled 2015-07-04 (×4): qty 1

## 2015-07-04 MED ORDER — DEXTROSE 5 % IV SOLN
500.0000 mg | Freq: Three times a day (TID) | INTRAVENOUS | Status: DC
  Administered 2015-07-04 – 2015-07-06 (×6): 500 mg via INTRAVENOUS
  Filled 2015-07-04 (×8): qty 5

## 2015-07-04 MED ORDER — MORPHINE SULFATE (PF) 2 MG/ML IV SOLN
2.0000 mg | INTRAVENOUS | Status: DC | PRN
  Administered 2015-07-04 – 2015-07-06 (×7): 2 mg via INTRAVENOUS
  Filled 2015-07-04 (×8): qty 1

## 2015-07-04 MED ORDER — POLYETHYLENE GLYCOL 3350 17 G PO PACK
17.0000 g | PACK | Freq: Every day | ORAL | Status: DC
  Administered 2015-07-04 – 2015-07-06 (×3): 17 g via ORAL
  Filled 2015-07-04 (×3): qty 1

## 2015-07-04 NOTE — Progress Notes (Signed)
[facility] PROGRESS NOTE  Jeff Ferrell WUJ:[id number] DOB: 1950/11/13 DOA: 07/01/2015  PCP: [name], MD  Brief HPI: 65 year old African-American male with the recent left hip fracture status post surgery, diabetes, chronic back pain, chronic diastolic CHF, presents with the hip pain and inability to walk. Patient was hospitalized late December and discharged early January. He underwent surgery for hip fracture. At that time skilled nursing facility for short-term rehabilitation was recommended. However, patient declined and went home. There was concern for infection in his incision site. He was hospitalized for further management.  Past medical history:  Past Medical History  Diagnosis Date  . Diabetes mellitus     regulated by diet  . Chronic back pain   . 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 Diabetic osteomyelitis (HCC) 03/29/2015  . Diabetic foot ulcer (HCC) 03/29/2015    Consultants: Orthopedics  Procedures:  Lower extremity venous Doppler No DVT noted  Antibiotics: Vancomycin  Subjective: Patient feels somewhat better. However, he continues to have pain in both his hips. Left more than right. Denies any nausea, vomiting. A lot of difficulty with trying to walk.   Objective: Vital Signs  Filed Vitals:   07/03/15 0653 07/03/15 1718 07/03/15 2146 07/04/15 0608  BP: 124/78 94/54 91/57  106/71  Pulse: 106 109 116 101  Temp: 98.6 F (37 C) 97.9 F (36.6 C) 97.3 F (36.3 C) 98.2 F (36.8 C)  TempSrc: Oral Oral Oral Oral  Resp:  16 16 18   Height:      Weight:      SpO2: 98% 99% 98% 98%    Intake/Output Summary (Last 24 hours) at 07/04/15 1112 Last data filed at 07/04/15 1009  Gross per 24 hour  Intake    240 ml  Output   1325 ml  Net  -1085 ml   Filed Weights   07/01/15 1804 07/03/15 0500  Weight: 83.462 kg (184 lb) 83.4 kg (183 lb 13.8 oz)    General appearance: alert, appears stated age Resp: Good air entry bilaterally without  any wheezing, rales or rhonchi Cardio: S1, S2 is mildly tachycardic. No S3, S4. No rubs, murmurs or bruit. No pedal edema.  GI: soft, non-tender; bowel sounds normal; no masses,  no organomegaly Extremities: 3 different incisions sites are present. One of his covered with dressing due to bloody discharge. Other 2 sites did not show any erythema or discharge. Pedal pulses are palpable. No changes noted. Neurologic: Alert and oriented X 3. Cranial nerves II-12 intact. Moving all his extremities.   Lab Results:  Basic Metabolic Panel:  Recent Labs Lab 07/01/15 1836 07/02/15 0538 07/03/15 0533 07/04/15 0517  NA 128* 139 133* 134*  K 4.2 4.7 4.0 4.2  CL 92* 101 98* 94*  CO2 25 23 25 28   GLUCOSE 145* 137* 205* 153*  BUN <5* <5* 10 12  CREATININE 0.68 0.49* 0.51* 0.65  CALCIUM 9.0 9.3 8.9 9.1   Liver Function Tests:  Recent Labs Lab 07/01/15 1836 07/02/15 0538  AST 44* 35  ALT 19 19  ALKPHOS 208* 202*  BILITOT 0.6 1.0  PROT 7.2 7.5  ALBUMIN 3.3* 3.3*   CBC:  Recent Labs Lab 07/01/15 1836 07/01/15 2041 07/02/15 0538 07/03/15 0533 07/04/15 0517  WBC 7.3 8.8 8.0 7.1 6.8  NEUTROABS PENDING 6.2  --   --   --   HGB 10.7* 10.7* 11.5* 10.5* 10.4*  HCT 31.8* 32.4* 35.0* 32.9* 32.2*  MCV 86.9 87.6 88.4 89.6 88.5  PLT 169 620* 681* 497*  449*   BNP (last 3 results)  Recent Labs  06/11/15 1106 07/02/15 0538  BNP 27.7 34.0    CBG:  Recent Labs Lab 07/03/15 0811 07/03/15 1114 07/03/15 1717 07/03/15 2155 07/04/15 0807  GLUCAP 127* 171* 139* 167* 132*    Recent Results (from the past 240 hour(s))  Culture, blood (routine x 2)     Status: None   Collection Time: 07/01/15  6:36 PM  Result Value Ref Range Status   Specimen Description BLOOD LEFT FOREARM  Final   Special Requests IN PEDIATRIC BOTTLE 5CC  Final   Culture  Setup Time   Final    GRAM POSITIVE COCCI IN CLUSTERS AEROBIC BOTTLE ONLY CRITICAL RESULT CALLED TO, READ BACK BY AND VERIFIED WITH: B MAY RN  1947 07/02/15 A BROWNING    Culture   Final    STAPHYLOCOCCUS SPECIES (COAGULASE NEGATIVE) THE SIGNIFICANCE OF ISOLATING THIS ORGANISM FROM A SINGLE VENIPUNCTURE CANNOT BE PREDICTED WITHOUT FURTHER CLINICAL AND CULTURE CORRELATION. SUSCEPTIBILITIES AVAILABLE ONLY ON REQUEST. Performed at [facility]    Report Status 07/04/2015 FINAL  Final  Culture, blood (single)     Status: None (Preliminary result)   Collection Time: 07/03/15 10:33 AM  Result Value Ref Range Status   Specimen Description BLOOD LEFT ANTECUBITAL  Final   Special Requests   Final    BOTTLES DRAWN AEROBIC AND ANAEROBIC 10CC Performed at [facility]    Culture PENDING  Incomplete   Report Status PENDING  Incomplete      Studies/Results: No results found.  Medications:  Scheduled: . docusate sodium  100 mg Oral BID  . feeding supplement (GLUCERNA SHAKE)  237 mL Oral TID BM  . heparin  5,000 Units Subcutaneous 3 times per day  . insulin aspart  0-9 Units Subcutaneous TID WC  . methocarbamol (ROBAXIN)  IV  500 mg Intravenous 3 times per day  . polyethylene glycol  17 g Oral Daily  . sodium chloride  3 mL Intravenous Q12H  . tamsulosin  0.4 mg Oral Daily  . vancomycin  1,000 mg Intravenous Q12H   Continuous:  ZDG:[id number]:acetaminophen **OR** acetaminophen, alum & mag hydroxide-simeth, morphine injection, ondansetron **OR** ondansetron (ZOFRAN) IV, oxyCODONE  Assessment/Plan:  Principal Problem:   Left hip pain Active Problems:   Diabetes mellitus (HCC)   Malnutrition of moderate degree   Back pain   Femur fracture, left (HCC)   Hyponatremia   Left leg swelling   Left-sided chest wall pain    Bilateral Hip pain Patient has bilateral hip pain. He underwent surgery for left hip fracture recently. Concern is about infection in the joint. MRI of the left hip is pending. The right hip seems to be secondary issue from putting more weight recently. Patient cannot walk due to severe pain. MRI of lumbar  spine is more consistent with post surgical change. X-ray of left hip showed post surgical changes without evidence of complications. Some drainage noted from one of the incision sites. Patient has been seen by orthopedics. Placed on antibiotics. Blood cultures are positive, as discussed below. Continue pain medications and muscle relaxants. Tachycardia most likely due to severe pain. Monitor for now.  Bacteremia One set of blood cultures growing gram-positive cocci. This is growing coag-negative staph. Aureus. Second culture is negative so far. So most likely the initial set of positive blood culture reflective of contamination. However, would continue intravenous antibiotics for now. Await results from second set of cultures. Pursue workup as discussed above.  Hx  of recent femur fracture s/p surgery with drainage from incision site Seen by Ortho. Wound vac applied. On IV Vanc. Proceed with MRI of the left hip considering bacteremia and recent surgery.  DM-II Last A1c 7.6, fairly controled. Patient is taking Januvia at home. SSI  Malnutrition of moderate degree Ensure.  Hyponatremia Likely due to decreased oral intake. Sodium level has improved with IV fluids. Continue to monitor for now.   Left leg swelling Swelling is most likely due to recent surgery. Doppler studies negative for DVT. BNP is normal.  Left-sided chest wall pain secondary to rib fractures Chest x-ray showed minimally displaced fractures of left posterior fourth, fifth, and sixth ribs, age indeterminate. No pulmonary complication. Pain control as above  DVT Prophylaxis: Subcutaneous heparin    Code Status: Full code  Family Communication: Discussed with the patient  Disposition Plan: PT and OT has recommended SNF. Await MRI left hip and further orthopedic input. Continue IV antibiotics.     LOS: 2 days   [facility]  [facility] Pager [phone] 07/04/2015, 11:12 AM  If 7PM-7AM, please contact  night-coverage at [email or web address], password [facility]

## 2015-07-05 LAB — BASIC METABOLIC PANEL
Anion gap: 11 (ref 5–15)
BUN: 12 mg/dL (ref 6–20)
CALCIUM: 9.5 mg/dL (ref 8.9–10.3)
CO2: 26 mmol/L (ref 22–32)
CREATININE: 0.65 mg/dL (ref 0.61–1.24)
Chloride: 97 mmol/L — ABNORMAL LOW (ref 101–111)
GFR calc Af Amer: >60 mL/min (ref >60)
GFR calc non Af Amer: >60 mL/min (ref >60)
GLUCOSE: 191 mg/dL — AB (ref 65–99)
Potassium: 4.5 mmol/L (ref 3.5–5.1)
SODIUM: 134 mmol/L — AB (ref 135–145)

## 2015-07-05 LAB — CBC
HCT: 32.7 % — ABNORMAL LOW (ref 39.0–52.0)
Hemoglobin: 10.4 g/dL — ABNORMAL LOW (ref 13.0–17.0)
MCH: 28.6 pg (ref 26.0–34.0)
MCHC: 31.8 g/dL (ref 30.0–36.0)
MCV: 89.8 fL (ref 78.0–100.0)
PLATELETS: 376 10*3/uL (ref 150–400)
RBC: 3.64 MIL/uL — AB (ref 4.22–5.81)
RDW: 13.5 % (ref 11.5–15.5)
WBC: 6.3 10*3/uL (ref 4.0–10.5)

## 2015-07-05 LAB — GLUCOSE, CAPILLARY
GLUCOSE-CAPILLARY: 144 mg/dL — AB (ref 65–99)
Glucose-Capillary: 136 mg/dL — ABNORMAL HIGH (ref 65–99)
Glucose-Capillary: 138 mg/dL — ABNORMAL HIGH (ref 65–99)
Glucose-Capillary: 117 mg/dL — ABNORMAL HIGH (ref 65–99)

## 2015-07-05 MED ORDER — OXYCODONE-ACETAMINOPHEN 7.5-325 MG PO TABS
1.0000 | ORAL_TABLET | Freq: Three times a day (TID) | ORAL | Status: DC
  Administered 2015-07-05 – 2015-07-06 (×4): 1 via ORAL
  Filled 2015-07-05 (×4): qty 1

## 2015-07-05 NOTE — Care Management Important Message (Signed)
Important Message  Patient Details IM Letter given to Bjorn LoserRhonda to present to Patient Name: Jeff Ferrell Mergen MRN: [id number] Date of Birth: 05/25/1951   Medicare Important Message Given:  Yes    Haskell FlirtJamison, Chrsitopher Wik 07/05/2015, 11:12 AMImportant Message  Patient Details  Name: Jeff Ferrell Scavone MRN: [id number] Date of Birth: 11/30/1950   Medicare Important Message Given:  Yes    Haskell FlirtJamison, Izzak Fries 07/05/2015, 11:12 AM

## 2015-07-05 NOTE — Progress Notes (Signed)
Pharmacy Antibiotic Follow-up Note  Guinevere FerrariHarold Schildt is a 65 y.o. year-old male admitted on 07/01/2015.  The patient is currently on day 4 of vancomycin for wound infection.  Assessment/Plan: Per ortho notes, to continue vancomycin x 24 more hours then plan to change to PO Bactrim to complete 14 days of total abx's. As a result, will not check vanc trough with plan to d/c tomorrow and Scr remains stable  Temp (24hrs), Avg:98.3 F (36.8 C), Min:98 F (36.7 C), Max:98.5 F (36.9 C)   Recent Labs Lab 07/01/15 2041 07/02/15 0538 07/03/15 0533 07/04/15 0517 07/05/15 0513  WBC 8.8 8.0 7.1 6.8 6.3    Recent Labs Lab 07/01/15 1836 07/02/15 0538 07/03/15 0533 07/04/15 0517 07/05/15 0513  CREATININE 0.68 0.49* 0.51* 0.65 0.65   Estimated Creatinine Clearance: 92.1 mL/min (by C-G formula based on Cr of 0.65).    Allergies  Allergen Reactions  . Penicillins Hives and Rash    Has patient had a PCN reaction causing immediate rash, facial/tongue/throat swelling, SOB or lightheadedness with hypotension: Yes Has patient had a PCN reaction causing severe rash involving mucus membranes or skin necrosis: Yes   Has patient had a PCN reaction that required hospitalization No Has patient had a PCN reaction occurring within the last 10 years: No If all of the above answers are "NO", then may proceed with Cephalosporin use.    Note PCN allergy of hives, rash  Antimicrobials this admission: 1/13 >> Vancomycin >>  Levels/dose changes this admission:  Microbiology Results: 1/12 BCx:  1/2 CoNS 1/14 blood x1: ngtd   Hessie KnowsJustin M Selim Durden, PharmD, BCPS Pager [phone] 07/05/2015 8:28 AM

## 2015-07-05 NOTE — Progress Notes (Signed)
VAC removed today.  Surgical wound is greatly improved.  Dry dressing placed.  Some drainage is to be expected.  Continue vancomycin for 24 more hours and then can switch to po bactrim DS BID x 14 days.  MRI of left hip is of limited value as surgery did not involve the hip joint itself and with the close proximity of the hardware in the femoral head, there will likely be lots of metal artifact that will decrease the accuracy of the study.  Will check on wound again in the next day.  Clinically speaking, patient is moving is left hip much better today.  His pain is c/w postop pain.  He has had settling of his fracture also.  Mayra ReelN. Michael Sigfredo Schreier, MD [facility] [phone] 7:19 AM

## 2015-07-05 NOTE — Progress Notes (Signed)
[facility] PROGRESS NOTE  Jeff Ferrell ZOX:[id number] DOB: 03/26/1951 DOA: 07/01/2015  PCP: [name], MD  Brief HPI: 65 year old African-American male with the recent left hip fracture status post surgery, diabetes, chronic back pain, chronic diastolic CHF, presents with the hip pain and inability to walk. Patient was hospitalized late December and discharged early January. He underwent surgery for hip fracture. At that time skilled nursing facility for short-term rehabilitation was recommended. However, patient declined and went home. There was concern for infection in his incision site. He was hospitalized for further management.  Past medical history:  Past Medical History  Diagnosis Date  . Diabetes mellitus     regulated by diet  . Chronic back pain   . 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 Diabetic osteomyelitis (HCC) 03/29/2015  . Diabetic foot ulcer (HCC) 03/29/2015    Consultants: Orthopedics  Procedures:  Lower extremity venous Doppler No DVT noted  Antibiotics: Vancomycin  Subjective: Patient continues to have pain in his lower back and both his hips. Complains of severe muscle spasms. A lot of these issues have been ongoing for the past few months.   Objective: Vital Signs  Filed Vitals:   07/04/15 1610 07/04/15 1835 07/05/15 0228 07/05/15 0604  BP: 94/55 101/62 96/58 109/62  Pulse:   119 115  Temp:   98.5 F (36.9 C) 98.4 F (36.9 C)  TempSrc:   Oral Oral  Resp:   20 20  Height:      Weight:    74.072 kg (163 lb 4.8 oz)  SpO2:   92% 92%    Intake/Output Summary (Last 24 hours) at 07/05/15 1141 Last data filed at 07/05/15 1007  Gross per 24 hour  Intake    243 ml  Output    950 ml  Net   -707 ml   Filed Weights   07/01/15 1804 07/03/15 0500 07/05/15 0604  Weight: 83.462 kg (184 lb) 83.4 kg (183 lb 13.8 oz) 74.072 kg (163 lb 4.8 oz)    General appearance: alert, appears stated age Resp: Good air entry bilaterally  without any wheezing, rales or rhonchi Cardio: S1, S2 is mildly tachycardic. No S3, S4. No rubs, murmurs or bruit. No pedal edema.  GI: soft, non-tender; bowel sounds normal; no masses,  no organomegaly Extremities: 3 different incisions sites are present. One of his site is covered with dressing due to bloody discharge. Other 2 sites did not show any erythema or discharge. Pedal pulses are palpable. No changes noted. Neurologic: Alert and oriented X 3. Cranial nerves II-12 intact. Moving all his extremities.   Lab Results:  Basic Metabolic Panel:  Recent Labs Lab 07/01/15 1836 07/02/15 0538 07/03/15 0533 07/04/15 0517 07/05/15 0513  NA 128* 139 133* 134* 134*  K 4.2 4.7 4.0 4.2 4.5  CL 92* 101 98* 94* 97*  CO2 25 23 25 28 26   GLUCOSE 145* 137* 205* 153* 191*  BUN <5* <5* 10 12 12   CREATININE 0.68 0.49* 0.51* 0.65 0.65  CALCIUM 9.0 9.3 8.9 9.1 9.5   Liver Function Tests:  Recent Labs Lab 07/01/15 1836 07/02/15 0538  AST 44* 35  ALT 19 19  ALKPHOS 208* 202*  BILITOT 0.6 1.0  PROT 7.2 7.5  ALBUMIN 3.3* 3.3*   CBC:  Recent Labs Lab 07/01/15 1836 07/01/15 2041 07/02/15 0538 07/03/15 0533 07/04/15 0517 07/05/15 0513  WBC 7.3 8.8 8.0 7.1 6.8 6.3  NEUTROABS PENDING 6.2  --   --   --   --  HGB 10.7* 10.7* 11.5* 10.5* 10.4* 10.4*  HCT 31.8* 32.4* 35.0* 32.9* 32.2* 32.7*  MCV 86.9 87.6 88.4 89.6 88.5 89.8  PLT 169 620* 681* 497* 449* 376   BNP (last 3 results)  Recent Labs  06/11/15 1106 07/02/15 0538  BNP 27.7 34.0    CBG:  Recent Labs Lab 07/04/15 0807 07/04/15 1157 07/04/15 1616 07/04/15 2321 07/05/15 0808  GLUCAP 132* 158* 175* 210* 136*    Recent Results (from the past 240 hour(s))  Culture, blood (routine x 2)     Status: None   Collection Time: 07/01/15  6:36 PM  Result Value Ref Range Status   Specimen Description BLOOD LEFT FOREARM  Final   Special Requests IN PEDIATRIC BOTTLE 5CC  Final   Culture  Setup Time   Final    GRAM POSITIVE  COCCI IN CLUSTERS AEROBIC BOTTLE ONLY CRITICAL RESULT CALLED TO, READ BACK BY AND VERIFIED WITH: B MAY RN 1947 07/02/15 A BROWNING    Culture   Final    STAPHYLOCOCCUS SPECIES (COAGULASE NEGATIVE) THE SIGNIFICANCE OF ISOLATING THIS ORGANISM FROM A SINGLE VENIPUNCTURE CANNOT BE PREDICTED WITHOUT FURTHER CLINICAL AND CULTURE CORRELATION. SUSCEPTIBILITIES AVAILABLE ONLY ON REQUEST. Performed at [facility]    Report Status 07/04/2015 FINAL  Final  Culture, blood (single)     Status: None (Preliminary result)   Collection Time: 07/03/15 10:33 AM  Result Value Ref Range Status   Specimen Description BLOOD LEFT ANTECUBITAL  Final   Special Requests BOTTLES DRAWN AEROBIC AND ANAEROBIC 10CC  Final   Culture   Final    NO GROWTH 2 DAYS Performed at [facility]    Report Status PENDING  Incomplete      Studies/Results: No results found.  Medications:  Scheduled: . docusate sodium  100 mg Oral BID  . feeding supplement (GLUCERNA SHAKE)  237 mL Oral TID BM  . heparin  5,000 Units Subcutaneous 3 times per day  . insulin aspart  0-9 Units Subcutaneous TID WC  . methocarbamol (ROBAXIN)  IV  500 mg Intravenous 3 times per day  . oxyCODONE-acetaminophen  1 tablet Oral TID  . polyethylene glycol  17 g Oral Daily  . sodium chloride  3 mL Intravenous Q12H  . tamsulosin  0.4 mg Oral Daily  . vancomycin  1,000 mg Intravenous Q12H   Continuous:  ZOX:[id number]:acetaminophen **OR** acetaminophen, alum & mag hydroxide-simeth, morphine injection, ondansetron **OR** ondansetron (ZOFRAN) IV, oxyCODONE  Assessment/Plan:  Principal Problem:   Left hip pain Active Problems:   Diabetes mellitus (HCC)   Malnutrition of moderate degree   Back pain   Femur fracture, left (HCC)   Hyponatremia   Left leg swelling   Left-sided chest wall pain    Bilateral Hip pain Patient has bilateral hip pain. He underwent surgery for left hip fracture recently. Concern is about infection in the joint. MRI  of the left hip was ordered. However, orthopedics thinks that this would be low yield and they feel that he is much improved. Plus the bacteremia appears to be a contaminant. His WBC is normal. He is afebrile. His symptoms have been present for 2 months or more. In view of these, we will hold off on MRI hip for now. The right hip seems to be secondary issue from putting more weight recently. Patient has been unable to walk due to severe pain. MRI of lumbar spine is more consistent with post surgical change. X-ray of left hip showed post surgical changes without evidence  of complications. Some drainage noted from one of the incision sites. Patient has been seen by orthopedics. Patient on vancomycin for incision site infection. Continue pain medications and muscle relaxants. Add scheduled Percocet. Tachycardia most likely due to severe pain. Monitor for now.  Bacteremia with coag negative staph Second set of blood cultures with no growth. Hence, the positive culture is most likely contaminant. Patient remains afebrile. WBC is normal. No further workup.   Hx of recent femur fracture s/p surgery with drainage from incision site Seen by Ortho. Wound vac removed today. Per orthopedics. The wound looks much better. Plan is to continue IV vancomycin for today. No need for MRI hip at this time. Please see above.   DM-II Last A1c 7.6, fairly controled. Patient is taking Januvia at home. SSI  Malnutrition of moderate degree Ensure.  Hyponatremia Likely due to decreased oral intake. Sodium level has improved with IV fluids. Continue to monitor for now.   Left leg swelling Swelling is most likely due to recent surgery. Doppler studies negative for DVT. BNP is normal.  Left-sided chest wall pain secondary to rib fractures Chest x-ray showed minimally displaced fractures of left posterior fourth, fifth, and sixth ribs, age indeterminate. No pulmonary complication. Pain control as above  DVT Prophylaxis:  Subcutaneous heparin    Code Status: Full code  Family Communication: Discussed with the patient  Disposition Plan: PT and OT has recommended SNF. Pain control is the main issue now. Antibiotics to be changed to oral tomorrow per Ortho. He needs to go to SNF for rehabilitation.     LOS: 3 days   [facility]  [facility] Pager [phone] 07/05/2015, 11:41 AM  If 7PM-7AM, please contact night-coverage at [email or web address], password [email or web address]

## 2015-07-06 LAB — GLUCOSE, CAPILLARY
GLUCOSE-CAPILLARY: 137 mg/dL — AB (ref 65–99)
GLUCOSE-CAPILLARY: 130 mg/dL — AB (ref 65–99)

## 2015-07-06 MED ORDER — POLYETHYLENE GLYCOL 3350 17 G PO PACK: 17.0000 g | PACK | Freq: Two times a day (BID) | ORAL | Status: DC

## 2015-07-06 MED ORDER — ACETAMINOPHEN 325 MG PO TABS: 650.0000 mg | ORAL_TABLET | Freq: Four times a day (QID) | ORAL | Status: DC | PRN

## 2015-07-06 MED ORDER — METHOCARBAMOL 500 MG PO TABS
500.0000 mg | ORAL_TABLET | Freq: Three times a day (TID) | ORAL | Status: DC
  Administered 2015-07-06: 500 mg via ORAL
  Filled 2015-07-06: qty 1

## 2015-07-06 MED ORDER — SULFAMETHOXAZOLE-TRIMETHOPRIM 800-160 MG PO TABS: 1.0000 | ORAL_TABLET | Freq: Two times a day (BID) | ORAL | Status: DC

## 2015-07-06 MED ORDER — OXYCODONE HCL 5 MG PO TABS: 5.0000 mg | ORAL_TABLET | ORAL | Status: DC | PRN

## 2015-07-06 MED ORDER — OXYCODONE-ACETAMINOPHEN 7.5-325 MG PO TABS: 1.0000 | ORAL_TABLET | Freq: Three times a day (TID) | ORAL | Status: DC

## 2015-07-06 MED ORDER — SULFAMETHOXAZOLE-TRIMETHOPRIM 800-160 MG PO TABS
1.0000 | ORAL_TABLET | Freq: Two times a day (BID) | ORAL | Status: DC
  Administered 2015-07-06: 1 via ORAL
  Filled 2015-07-06 (×2): qty 1

## 2015-07-06 MED ORDER — DOCUSATE SODIUM 100 MG PO CAPS: 100.0000 mg | ORAL_CAPSULE | Freq: Two times a day (BID) | ORAL | Status: DC

## 2015-07-06 MED ORDER — ENOXAPARIN SODIUM 40 MG/0.4ML ~~LOC~~ SOLN: 40.0000 mg | Freq: Every day | SUBCUTANEOUS | Status: DC

## 2015-07-06 MED ORDER — METHOCARBAMOL 500 MG PO TABS: 500.0000 mg | ORAL_TABLET | Freq: Three times a day (TID) | ORAL | Status: DC

## 2015-07-06 NOTE — Care Management Note (Signed)
Case Management Note  Patient Details  Name: [name] MRN: [id number] Date of Birth: 06/22/1950  Subjective/Objective:   Admitted with hip pain and unable to walk, PMH of recent left hip fracture, s/p of surgery, diabetes mellitus, chronic back pain, diastolic congestive heart failure                 Action/Plan: Discharge planning per CSW  Expected Discharge Date:                  Expected Discharge Plan:  Skilled Nursing Facility  In-House Referral:  Clinical Social Work  Discharge planning Services  CM Consult  Post Acute Care Choice:  NA Choice offered to:  NA  DME Arranged:  N/A DME Agency:  NA  HH Arranged:  NA HH Agency:  NA  Status of Service:  Completed, signed off  Medicare Important Message Given:  Yes Date Medicare IM Given:    Medicare IM give by:    Date Additional Medicare IM Given:    Additional Medicare Important Message give by:     If discussed at Long Length of Stay Meetings, dates discussed:    Additional Comments:  Alexis Goodell, RN 07/06/2015, 11:10 AM

## 2015-07-06 NOTE — Progress Notes (Signed)
Pt discharged from the unit via [facility]. Discharge instructions and prescriptions were sent with the transporter. Pt belongings are with pt. Report called to [facility] to Will, RN. No questions or concerns from the pt at this time.  Prabhav Faulkenberry W Rumeal Cullipher, RN

## 2015-07-06 NOTE — Clinical Social Work Note (Addendum)
CSW met with pt a bedside; pt has chosen [facility] for SNF placement. Pt is being d/c today. CSW left a msg for Janie in admissions [phone] to confirm bed availability today.   Update- Pt has been accepted by [facility] today. CSW faxed over d/c summary and orders and completed transport form. D/C packet was left with unit RN. Pt will need to arrive at [facility] before 3:15pm- CSW confirmed with [facility] that this will happen.    Cindra Presume, LCSW [phone] [facility]E, 5W 17-12 Licensed Clinical Social Worker

## 2015-07-06 NOTE — Discharge Instructions (Signed)

## 2015-07-06 NOTE — Discharge Summary (Signed)
[facility]  Physician Discharge Summary   Patient ID: Jeff Ferrell MRN: [id number] DOB/AGE: [birth year] 65 y.o.  Admit date: 07/01/2015 Discharge date: 07/06/2015  PCP: [name], MD  DISCHARGE DIAGNOSES:  Principal Problem:   Left hip pain Active Problems:   Diabetes mellitus (HCC)   Malnutrition of moderate degree   Back pain   Femur fracture, left (HCC)   Hyponatremia   Left leg swelling   Left-sided chest wall pain   RECOMMENDATIONS FOR OUTPATIENT FOLLOW UP: Please follow up with Dr. Roda Shutters in 1 week as instructed.  Please cut back on Robaxin to as needed once muscle spasms are better.  Check CBG's before meals and initiate SSI per SNF protocol.   DISCHARGE CONDITION: fair  Diet recommendation: Modified carbohydrate  Filed Weights   07/01/15 1804 07/03/15 0500 07/05/15 0604  Weight: 83.462 kg (184 lb) 83.4 kg (183 lb 13.8 oz) 74.072 kg (163 lb 4.8 oz)    INITIAL HISTORY: 65 year old African-American male with the recent left hip fracture status post surgery, diabetes, chronic back pain, chronic diastolic CHF, presents with the hip pain and inability to walk. Patient was hospitalized late December and discharged early January. He underwent surgery for hip fracture. At that time skilled nursing facility for short-term rehabilitation was recommended. However, patient declined and went home. There was concern for infection in his incision site. He was hospitalized for further management.  Consultants: Orthopedics  Procedures:  Lower extremity venous Doppler No DVT noted   HOSPITAL COURSE:   Bilateral Hip pain Patient presented with bilateral hip pain. He underwent surgery for left hip fracture recently. Imaging studies did not show anything concerning. He was also complaining of back pain which prompted an MRI of the lumbar spine which showed postsurgical changes. One of the incision sites in the left thigh area was noted to have some drainage. There  was some concern about incision site infection. Orthopedics was consulted. Patient was started on intravenous vancomycin. Patient was placed on a wound VAC. With this, the wound started improving. Orthopedics not concerned about infection in the hip joint. So MRI hip which was ordered was ultimately not done. Patient's pain has improved with pain medications and muscle relaxants. He has been transitioned to oral antibiotics. For pain management he is getting Percocet 3 times a day along with oxycodone for breakthrough. With this regimen, his pain is improved. He is also receiving Robaxin. Patient was noted to be tachycardic due to his significant pain. His heart rate has improved now. Orthopedic surgeon would like to see him in his office in one week. He was seen by physical and occupational therapy. During his previous hospitalization also SNF was recommended. However, the patient decided to go home. I spent a lot of time talking to the patient about the need for him to go to SNF. He finally accepts reality and is agreeable to go to SNF.  Bacteremia with coag negative staph First set of blood cultures grew coag-negative staph. Second set was ordered which did not show any growth. This is most likely a contaminant. Patient remains afebrile. WBC is normal. No further workup.   Hx of recent femur fracture s/p surgery with drainage from incision site Seen by Ortho.  see above. Wound looks better.   DM-II Last A1c 7.6, fairly controled. Patient is taking Januvia at home. SSI  Malnutrition of moderate degree Ensure.  Hyponatremia Likely due to decreased oral intake. Sodium level has improved with IV fluids.   Left leg swelling Swelling is  most likely due to recent surgery. Doppler studies negative for DVT. BNP is normal.  Left-sided chest wall pain secondary to rib fractures Chest x-ray showed minimally displaced fractures of left posterior fourth, fifth, and sixth ribs, age indeterminate. No  pulmonary complication. Pain control as above  Overall improved. Patient has a clear need to go to skilled nursing facility for rehabilitation. Stable for discharge today.    PERTINENT LABS:  The results of significant diagnostics from this hospitalization (including imaging, microbiology, ancillary and laboratory) are listed below for reference.    Microbiology: Recent Results (from the past 240 hour(s))  Culture, blood (routine x 2)     Status: None   Collection Time: 07/01/15  6:36 PM  Result Value Ref Range Status   Specimen Description BLOOD LEFT FOREARM  Final   Special Requests IN PEDIATRIC BOTTLE 5CC  Final   Culture  Setup Time   Final    GRAM POSITIVE COCCI IN CLUSTERS AEROBIC BOTTLE ONLY CRITICAL RESULT CALLED TO, READ BACK BY AND VERIFIED WITH: B MAY RN 1946 07/02/15 A BROWNING    Culture   Final    STAPHYLOCOCCUS SPECIES (COAGULASE NEGATIVE) THE SIGNIFICANCE OF ISOLATING THIS ORGANISM FROM A SINGLE VENIPUNCTURE CANNOT BE PREDICTED WITHOUT FURTHER CLINICAL AND CULTURE CORRELATION. SUSCEPTIBILITIES AVAILABLE ONLY ON REQUEST. Performed at [facility]    Report Status 07/04/2015 FINAL  Final  Culture, blood (single)     Status: None (Preliminary result)   Collection Time: 07/03/15 10:33 AM  Result Value Ref Range Status   Specimen Description BLOOD LEFT ANTECUBITAL  Final   Special Requests BOTTLES DRAWN AEROBIC AND ANAEROBIC 10CC  Final   Culture   Final    NO GROWTH 3 DAYS Performed at [facility]    Report Status PENDING  Incomplete     Labs: Basic Metabolic Panel:  Recent Labs Lab 07/01/15 1836 07/02/15 0538 07/03/15 0533 07/04/15 0517 07/05/15 0513  NA 128* 139 133* 134* 134*  K 4.2 4.7 4.0 4.2 4.5  CL 92* 101 98* 94* 97*  CO2 GLUCOSE 145* 137* 205* 153* 191*  BUN <5* <5* CREATININE 0.68 0.49* 0.51* 0.65 0.65  CALCIUM 9.0 9.3 8.9 9.1 9.5   Liver Function Tests:  Recent Labs Lab 07/01/15 1836  07/02/15 0538  AST 44* 35  ALT 19 19  ALKPHOS 208* 202*  BILITOT 0.6 1.0  PROT 7.2 7.5  ALBUMIN 3.3* 3.3*   CBC:  Recent Labs Lab 07/01/15 1836 07/01/15 2041 07/02/15 0538 07/03/15 0533 07/04/15 0517 07/05/15 0513  WBC 7.3 8.8 8.0 7.1 6.8 6.3  NEUTROABS PENDING 6.2  --   --   --   --   HGB 10.7* 10.7* 11.5* 10.5* 10.4* 10.4*  HCT 31.8* 32.4* 35.0* 32.9* 32.2* 32.7*  MCV 86.9 87.6 88.4 89.6 88.5 89.8  PLT 169 620* 681* 497* 449* 376   BNP: BNP (last 3 results)  Recent Labs  06/11/15 1106 07/02/15 0538  BNP 27.7 34.0    CBG:  Recent Labs Lab 07/05/15 0808 07/05/15 1212 07/05/15 1652 07/05/15 2114 07/06/15 0758  GLUCAP 136* 138* 117* 144* 137*     IMAGING STUDIES Mr Lumbar Spine W Wo Contrast  07/01/2015  CLINICAL DATA:  Initial evaluation for low back pain. EXAM: MRI LUMBAR SPINE WITHOUT AND WITH CONTRAST TECHNIQUE: Multiplanar and multiecho pulse sequences of the lumbar spine were obtained without and with intravenous contrast. CONTRAST:  18mL MULTIHANCE GADOBENATE DIMEGLUMINE 529  MG/ML IV SOLN COMPARISON:  Prior MRI from 06/11/2015. FINDINGS: Study is moderately degraded by motion artifact. Alignment is stable. Severe height loss at the L5 vertebral body again seen. Bone marrow signal intensity within normal limits, and unchanged. Posterior lumbar fusion from L2 through S1 again seen. Severe L4-5 disc space narrowing, unchanged. Increased T2 signal intensity within the L5-S1 disc space is stable from previous. Mild disc enhancement also likely unchanged, although not as well evaluated on this motion degraded study. No associated paraspinous soft tissue inflammatory changes. No definite fluid collection Conus medullaris terminates normally at the T12 level. Signal intensity within the visualized cord is normal. No acute paraspinous soft tissue abnormality. Marked urinary bladder distention noted, similar to previous. T12-L1:  Normal intervertebral disc.  No stenosis.  L1-2: Degenerative disc bulge with disc desiccation. No focal disc herniation. Prominent epidural fat at the dorsal aspect of the thecal sac. Moderate bilateral facet arthrosis. Resultant moderate canal narrowing is stable. Mild bilateral foraminal narrowing also unchanged. L2-3: No significant disc bulge or focal disc protrusion. Status post decompression. No significant stenosis. L3-4: Somewhat limited evaluation due to susceptibility artifact. Probable mild disc bulge with disc desiccation. No significant canal stenosis. Foramina appear patent bilaterally. L4-5: Severe degenerative intervertebral disc space narrowing with disc desiccation and endplate changes, stable. No significant canal or foraminal stenosis. L5-S1: Fluid signal intensity within the L5-S1 disc, which is somewhat wide and anteriorly. Mild discal enhancement. This is stable relative to previous. No progressive endplate osseous erosion. Mild bilateral foraminal narrowing, worse on the right.No central canal stenosis. IMPRESSION: 1. Stable appearance of the lumbar spine. 2. Increased T2 signal intensity within the L5-S1 disc with mild discal enhancement. Given the relative stability of this finding and absence of adjacent paraspinous inflammatory changes, postsurgical changes are favored, although again, possible infection is not entirely excluded. 3. Stable posterior lumbar fusion from L2 through S1. 4. Severe disc height loss at L4-5 with L5 vertebral body height loss. 5. Marked urinary bladder distension, similar to previous. Electronically Signed   By: Rise Mu M.D.   On: 07/01/2015 23:41   Dg Chest Port 1 View  07/02/2015  CLINICAL DATA:  Left chest wall pain. EXAM: PORTABLE CHEST 1 VIEW COMPARISON:  None. FINDINGS: The heart is normal in size. Mild tortuosity of the thoracic aorta. Question vascular congestion versus vascular crowding secondary to low lung volumes. a No consolidation, pleural effusion, or pneumothorax.  Minimally displaced fractures of left posterior fourth, fifth, and sixth ribs. IMPRESSION: Minimally displaced fractures of left posterior fourth, fifth, and sixth ribs, age indeterminate. No pulmonary complication. Electronically Signed   By: Rubye Oaks M.D.   On: 07/02/2015 02:40    Dg Hips Bilat With Pelvis 3-4 Views  07/01/2015  CLINICAL DATA:  Left and right hip pain. Left hip surgery 3 weeks prior. EXAM: DG HIP (WITH OR WITHOUT PELVIS) 3-4V BILAT COMPARISON:  Pelvis and hip MRI 06/16/2015 FINDINGS: Intra medullary rod with trans trochanteric screws traverse the left proximal femur, low femoral neck fracture not well seen radiographically. No new fracture or periprosthetic lucency. Distal aspect of the intra medullary rod not included in the field of view. Skin staples laterally and anteriorly at the surgical site with probable soft tissue edema. Pubic rami and pubic symphysis are intact. Right femoral head is seated in the acetabulum, unchanged from prior MRI. Lumbosacral hardware again seen. IMPRESSION: 1. Recent intra medullary rods and screw fixation of proximal left femur fracture. No postoperative complication. Skin staples remain in  place with probable soft tissue edema. 2. No acute abnormality of the right hip or bony pelvis. Postsurgical change in the lower lumbar spine and pelvis. Electronically Signed   By: Rubye Oaks M.D.   On: 07/01/2015 19:19    DISCHARGE EXAMINATION: Filed Vitals:   07/05/15 0604 07/05/15 1418 07/05/15 2112 07/06/15 0451  BP: 109/62 95/56 92/55  121/73  Pulse: 115 110 108 105  Temp: 98.4 F (36.9 C) 98.4 F (36.9 C) 97.7 F (36.5 C) 97.5 F (36.4 C)  TempSrc: Oral Oral Oral Oral  Resp: Height:      Weight: 74.072 kg (163 lb 4.8 oz)     SpO2: 92% 100% 98% 99%   General appearance: alert, cooperative, appears stated age and no distress Resp: clear to auscultation bilaterally Cardio: regular rate and rhythm, S1, S2 normal, no murmur,  click, rub or gallop GI: soft, non-tender; bowel sounds normal; no masses,  no organomegaly Extremities: 3 different incisions sites are present. One of his site is covered with dressing. Other 2 sites did not show any erythema or discharge. Pedal pulses are palpable. Improved range of motion.  DISPOSITION: SNF  Discharge Instructions    Call MD for:  difficulty breathing, headache or visual disturbances    Complete by:  As directed      Call MD for:  extreme fatigue    Complete by:  As directed      Call MD for:  persistant dizziness or light-headedness    Complete by:  As directed      Call MD for:  persistant nausea and vomiting    Complete by:  As directed      Call MD for:  redness, tenderness, or signs of infection (pain, swelling, redness, odor or green/yellow discharge around incision site)    Complete by:  As directed      Call MD for:  severe uncontrolled pain    Complete by:  As directed      Call MD for:  temperature >100.4    Complete by:  As directed      Diet Carb Modified    Complete by:  As directed      Discharge instructions    Complete by:  As directed   Please follow up with Dr. Roda Shutters as instructed. Please cut back on Robaxin to as needed once muscle spasms are better. Check CBG's before meals and initiate SSI per SNF protocol.  You were cared for by a hospitalist during your hospital stay. If you have any questions about your discharge medications or the care you received while you were in the hospital after you are discharged, you can call the unit and asked to speak with the hospitalist on call if the hospitalist that took care of you is not available. Once you are discharged, your primary care physician will handle any further medical issues. Please note that NO REFILLS for any discharge medications will be authorized once you are discharged, as it is imperative that you return to your primary care physician (or establish a relationship with a primary care physician if  you do not have one) for your aftercare needs so that they can reassess your need for medications and monitor your lab values. If you do not have a primary care physician, you can call [phone] for a physician referral.     Increase activity slowly    Complete by:  As directed  ALLERGIES:  Allergies  Allergen Reactions  . Penicillins Hives and Rash    Has patient had a PCN reaction causing immediate rash, facial/tongue/throat swelling, SOB or lightheadedness with hypotension: Yes Has patient had a PCN reaction causing severe rash involving mucus membranes or skin necrosis: Yes   Has patient had a PCN reaction that required hospitalization No Has patient had a PCN reaction occurring within the last 10 years: No If all of the above answers are "NO", then may proceed with Cephalosporin use.       Current Discharge Medication List    START taking these medications   Details  acetaminophen (TYLENOL) 325 MG tablet Take 2 tablets (650 mg total) by mouth every 6 (six) hours as needed for mild pain (or Fever >/= 101).    docusate sodium (COLACE) 100 MG capsule Take 1 capsule (100 mg total) by mouth 2 (two) times daily. Qty: 60 capsule, Refills: 0    methocarbamol (ROBAXIN) 500 MG tablet Take 1 tablet (500 mg total) by mouth 3 (three) times daily. Qty: 90 tablet, Refills: 0    oxyCODONE-acetaminophen (PERCOCET) 7.5-325 MG tablet Take 1 tablet by mouth 3 (three) times daily. Qty: 30 tablet, Refills: 0    polyethylene glycol (MIRALAX / GLYCOLAX) packet Take 17 g by mouth 2 (two) times daily. Qty: 60 each, Refills: 0    sulfamethoxazole-trimethoprim (BACTRIM DS,SEPTRA DS) 800-160 MG tablet Take 1 tablet by mouth every 12 (twelve) hours. For 10 days Qty: 20 tablet, Refills: 0      CONTINUE these medications which have CHANGED   Details  enoxaparin (LOVENOX) 40 MG/0.4ML injection Inject 0.4 mLs (40 mg total) into the skin daily. For 3 weeks Qty: 14 Syringe, Refills: 0     oxyCODONE (OXY IR/ROXICODONE) 5 MG immediate release tablet Take 1-3 tablets (5-15 mg total) by mouth every 4 (four) hours as needed. Qty: 30 tablet, Refills: 0      CONTINUE these medications which have NOT CHANGED   Details  feeding supplement, GLUCERNA SHAKE, (GLUCERNA SHAKE) LIQD Take 237 mLs by mouth 3 (three) times daily between meals. Qty: 90 Can, Refills: 0    sitaGLIPtin (JANUVIA) 50 MG tablet Take 1 tablet (50 mg total) by mouth daily. Qty: 30 tablet, Refills: 1    tamsulosin (FLOMAX) 0.4 MG CAPS capsule Take 1 capsule (0.4 mg total) by mouth daily. Qty: 30 capsule, Refills: 1      STOP taking these medications     fentaNYL (DURAGESIC - DOSED MCG/HR) 25 MCG/HR patch        Follow-up Information    Follow up with Cheral Almas, MD In 1 week.   Specialty:  Orthopedic Surgery   Why:  For wound re-check   Contact information:   [address] [place] [address] [phone]       Follow up with [name], MD. Schedule an appointment as soon as possible for a visit in 2 weeks.   Specialty:  Internal Medicine   Why:  post hospitalization follow up   Contact information:   [address] [place] [address] [phone]       TOTAL DISCHARGE TIME: 35 MINUTES   [facility]  [facility] Pager [phone]  07/06/2015, 11:54 AM

## 2015-07-06 NOTE — Progress Notes (Signed)
Left hip dehiscence is slightly improved, certainly no worse.  Drainage and fibrinous exudate is expected.  Would benefit from wet to dry dressings with saline BID which I have ordered and the patient will need to receive after dc from hospital also.  I feel that he needs to go to a SNF because he is unlikely going to effectively care for himself at home with his h/o alcohol abuse.  vanc can can be stopped and switched to bactrim at thsi point.  Stable from ortho stand point.  I would like to see him in the office in 1 week for another wound check.  Mayra Reel, MD [facility] [phone] 8:27 AM

## 2015-07-06 NOTE — Clinical Social Work Placement (Signed)
   CLINICAL SOCIAL WORK PLACEMENT  NOTE  Date:  07/06/2015  Patient Details  Name: Jeff Ferrell MRN: [id number] Date of Birth: [birth year]  Clinical Social Work is seeking post-discharge placement for this patient at the Skilled  Nursing Facility level of care (*CSW will initial, date and re-position this form in  chart as items are completed):  Yes   Patient/family provided with [place] Clinical Social Work Department's list of facilities offering this level of care within the geographic area requested by the patient (or if unable, by the patient's family).  Yes   Patient/family informed of their freedom to choose among providers that offer the needed level of care, that participate in Medicare, Medicaid or managed care program needed by the patient, have an available bed and are willing to accept the patient.  Yes   Patient/family informed of [facility]'s ownership interest in [facility] and [facility], as well as of the fact that they are under no obligation to receive care at these facilities.  PASRR submitted to EDS on       PASRR number received on       Existing PASRR number confirmed on 07/06/15     FL2 transmitted to all facilities in geographic area requested by pt/family on       FL2 transmitted to all facilities within larger geographic area on       Patient informed that his/her managed care company has contracts with or will negotiate with certain facilities, including the following:        Yes   Patient/family informed of bed offers received.  Patient chooses bed at  [facility])     Physician recommends and patient chooses bed at      Patient to be transferred to  ([facility]) on 07/06/15.  Patient to be transferred to facility by  [facility])     Patient family notified on 07/06/15 of transfer.  Name of family member notified:        PHYSICIAN       Additional Comment:    _______________________________________________ Adrian Blackwater, LCSW 07/06/2015, 4:28 PM

## 2015-07-08 LAB — CULTURE, BLOOD (SINGLE): CULTURE: NO GROWTH

## 2015-07-18 ENCOUNTER — Inpatient Hospital Stay (HOSPITAL_COMMUNITY)
Admission: EM | Admit: 2015-07-18 | Discharge: 2015-07-28 | DRG: 480 | Disposition: A | Discharge status: Skilled Nursing Facility | Payer: Medicare Other | Attending: Family Medicine | Admitting: Family Medicine

## 2015-07-18 ENCOUNTER — Emergency Department (HOSPITAL_COMMUNITY): Payer: Medicare Other

## 2015-07-18 ENCOUNTER — Encounter (HOSPITAL_COMMUNITY): Payer: Self-pay

## 2015-07-18 DIAGNOSIS — M80051A Age-related osteoporosis with current pathological fracture, right femur, initial encounter for fracture: Principal | ICD-10-CM | POA: Diagnosis present

## 2015-07-18 DIAGNOSIS — T148XXA Other injury of unspecified body region, initial encounter: Secondary | ICD-10-CM

## 2015-07-18 DIAGNOSIS — S72001A Fracture of unspecified part of neck of right femur, initial encounter for closed fracture: Secondary | ICD-10-CM | POA: Diagnosis present

## 2015-07-18 DIAGNOSIS — R338 Other retention of urine: Secondary | ICD-10-CM | POA: Diagnosis not present

## 2015-07-18 DIAGNOSIS — Z7984 Long term (current) use of oral hypoglycemic drugs: Secondary | ICD-10-CM

## 2015-07-18 DIAGNOSIS — N401 Enlarged prostate with lower urinary tract symptoms: Secondary | ICD-10-CM | POA: Diagnosis not present

## 2015-07-18 DIAGNOSIS — Z6823 Body mass index (BMI) 23.0-23.9, adult: Secondary | ICD-10-CM

## 2015-07-18 DIAGNOSIS — Z89412 Acquired absence of left great toe: Secondary | ICD-10-CM

## 2015-07-18 DIAGNOSIS — M25551 Pain in right hip: Secondary | ICD-10-CM | POA: Diagnosis not present

## 2015-07-18 DIAGNOSIS — E871 Hypo-osmolality and hyponatremia: Secondary | ICD-10-CM | POA: Diagnosis present

## 2015-07-18 DIAGNOSIS — Z79899 Other long term (current) drug therapy: Secondary | ICD-10-CM

## 2015-07-18 DIAGNOSIS — E861 Hypovolemia: Secondary | ICD-10-CM | POA: Diagnosis present

## 2015-07-18 DIAGNOSIS — E43 Unspecified severe protein-calorie malnutrition: Secondary | ICD-10-CM | POA: Insufficient documentation

## 2015-07-18 DIAGNOSIS — T402X5A Adverse effect of other opioids, initial encounter: Secondary | ICD-10-CM | POA: Diagnosis not present

## 2015-07-18 DIAGNOSIS — E118 Type 2 diabetes mellitus with unspecified complications: Secondary | ICD-10-CM | POA: Diagnosis present

## 2015-07-18 DIAGNOSIS — Z419 Encounter for procedure for purposes other than remedying health state, unspecified: Secondary | ICD-10-CM

## 2015-07-18 DIAGNOSIS — E119 Type 2 diabetes mellitus without complications: Secondary | ICD-10-CM

## 2015-07-18 DIAGNOSIS — D509 Iron deficiency anemia, unspecified: Secondary | ICD-10-CM | POA: Diagnosis present

## 2015-07-18 LAB — CBC WITH DIFFERENTIAL/PLATELET
Basophils Absolute: 0 10*3/uL (ref 0.0–0.1)
Basophils Relative: 0 %
Eosinophils Absolute: 0 10*3/uL (ref 0.0–0.7)
Eosinophils Relative: 0 %
HCT: 33.6 % — ABNORMAL LOW (ref 39.0–52.0)
Hemoglobin: 11.1 g/dL — ABNORMAL LOW (ref 13.0–17.0)
Lymphocytes Relative: 11 %
Lymphs Abs: 0.8 10*3/uL (ref 0.7–4.0)
MCH: 28 pg (ref 26.0–34.0)
MCHC: 33 g/dL (ref 30.0–36.0)
MCV: 84.6 fL (ref 78.0–100.0)
Monocytes Absolute: 0.6 10*3/uL (ref 0.1–1.0)
Monocytes Relative: 9 %
Neutro Abs: 5.7 10*3/uL (ref 1.7–7.7)
Neutrophils Relative %: 80 %
Platelets: 369 10*3/uL (ref 150–400)
RBC: 3.97 MIL/uL — ABNORMAL LOW (ref 4.22–5.81)
RDW: 13.2 % (ref 11.5–15.5)
WBC: 7.2 10*3/uL (ref 4.0–10.5)

## 2015-07-18 MED ORDER — ONDANSETRON HCL 4 MG/2ML IJ SOLN
4.0000 mg | Freq: Once | INTRAMUSCULAR | Status: AC
  Administered 2015-07-18: 4 mg via INTRAVENOUS
  Filled 2015-07-18: qty 2

## 2015-07-18 MED ORDER — FENTANYL CITRATE (PF) 100 MCG/2ML IJ SOLN
100.0000 ug | Freq: Once | INTRAMUSCULAR | Status: AC
  Administered 2015-07-18: 100 ug via INTRAVENOUS
  Filled 2015-07-18: qty 2

## 2015-07-18 NOTE — ED Provider Notes (Signed)
CSN: [id number]     Arrival date & time 07/18/15  2138 History   First MD Initiated Contact with Patient 07/18/15 2146     Chief Complaint  Patient presents with  . Hip Pain     (Consider location/radiation/quality/duration/timing/severity/associated sxs/prior Treatment) HPI Patient presents to the emergency department with right hip pain that started on Thursday.  Patient states that he was transferring from his wheelchair to the bed when he felt discomfort in his right hip.  Patient states he did not fall or have any other injury.  The patient denies chest pain, shortness breath, weakness, dizziness, headache, blurred vision, back pain, neck pain, fever, incontinence, or syncope.  The patient states that movement, palpation make the pain worse Past Medical History  Diagnosis Date  . Diabetes mellitus     regulated by diet  . Chronic back pain   . 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 Diabetic osteomyelitis (HCC) 03/29/2015  . Diabetic foot ulcer (HCC) 03/29/2015   Past Surgical History  Procedure Laterality Date  . Back surgery      screws & hardware to lower back  . 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 Femur im nail Left 06/18/2015    Procedure: INTRAMEDULLARY (IM) NAIL FEMORAL;  Surgeon: Tarry Kos, MD;  Location: [facility];  Service: Orthopedics;  Laterality: Left;   History reviewed. No pertinent family history. Social History  Substance Use Topics  . Smoking status: Never Smoker   . Smokeless tobacco: None  . Alcohol Use: 3.6 oz/week    6 Cans of beer per week     Comment: daily    Review of Systems  All other systems negative except as documented in the HPI. All pertinent positives and negatives as reviewed in the HPI.  Allergies  Penicillins  Home Medications   Prior to Admission medications   Medication Sig Start Date End Date  Taking? Authorizing Provider  acetaminophen (TYLENOL) 325 MG tablet Take 2 tablets (650 mg total) by mouth every 6 (six) hours as needed for mild pain (or Fever >/= 101). 07/06/15  Yes Osvaldo Shipper, MD  bisacodyl (DULCOLAX) 10 MG suppository Place 10 mg rectally once.   Yes Historical Provider, MD  docusate sodium (COLACE) 100 MG capsule Take 1 capsule (100 mg total) by mouth 2 (two) times daily. 07/06/15  Yes Osvaldo Shipper, MD  enoxaparin (LOVENOX) 40 MG/0.4ML injection Inject 0.4 mLs (40 mg total) into the skin daily. For 3 weeks 07/06/15  Yes Osvaldo Shipper, MD  magnesium citrate SOLN Take 300 mLs by mouth once.   Yes Historical Provider, MD  magnesium hydroxide (MILK OF MAGNESIA) 400 MG/5ML suspension Take 30 mLs by mouth daily as needed for mild constipation.   Yes Historical Provider, MD  methocarbamol (ROBAXIN) 500 MG tablet Take 1 tablet (500 mg total) by mouth 3 (three) times daily. 07/06/15  Yes Osvaldo Shipper, MD  oxyCODONE (OXY IR/ROXICODONE) 5 MG immediate release tablet Take 1-3 tablets (5-15 mg total) by mouth every 4 (four) hours as needed. Patient taking differently: Take 5-15 mg by mouth every 4 (four) hours as needed (mild to moderate to severe pain.). 5 mg=mild pain 10 mg=moderate pain 15=severe pain. 07/06/15  Yes Osvaldo Shipper, MD  oxyCODONE-acetaminophen (PERCOCET) 7.5-325 MG tablet Take 1 tablet by mouth 3 (three) times daily. 07/06/15  Yes Osvaldo Shipper, MD  polyethylene glycol [facility] /  GLYCOLAX) packet Take 17 g by mouth 2 (two) times daily. 07/06/15  Yes Osvaldo Shipper, MD  sennosides-docusate sodium (SENOKOT-S) 8.6-50 MG tablet Take 1 tablet by mouth 2 (two) times daily.   Yes Historical Provider, MD  sitaGLIPtin (JANUVIA) 50 MG tablet Take 1 tablet (50 mg total) by mouth daily. 06/23/15  Yes Catarina Hartshorn, MD  sodium phosphate (FLEET) enema Place 1 enema rectally once. follow package directions   Yes Historical Provider, MD  sulfamethoxazole-trimethoprim (BACTRIM DS,SEPTRA DS)  800-160 MG tablet Take 1 tablet by mouth every 12 (twelve) hours. For 10 days Patient taking differently: Take 1 tablet by mouth every 12 (twelve) hours. For 14 days therapy course began 07/12/2015. 07/06/15  Yes Osvaldo Shipper, MD  tamsulosin (FLOMAX) 0.4 MG CAPS capsule Take 1 capsule (0.4 mg total) by mouth daily. 06/21/15  Yes Catarina Hartshorn, MD  feeding supplement, GLUCERNA SHAKE, (GLUCERNA SHAKE) LIQD Take 237 mLs by mouth 3 (three) times daily between meals. Patient not taking: Reported on 07/01/2015 06/21/15   Catarina Hartshorn, MD   BP 111/64 mmHg  Pulse 120  Temp(Src) 98.3 F (36.8 C) (Oral)  Resp 16  SpO2 92% Physical Exam  Constitutional: He is oriented to person, place, and time. He appears well-developed and well-nourished. No distress.  HENT:  Head: Normocephalic and atraumatic.  Mouth/Throat: Oropharynx is clear and moist.  Eyes: Pupils are equal, round, and reactive to light.  Neck: Normal range of motion. Neck supple.  Cardiovascular: Normal rate, regular rhythm and normal heart sounds.  Exam reveals no gallop and no friction rub.   No murmur heard. Pulmonary/Chest: Effort normal and breath sounds normal. No respiratory distress. He has no wheezes.  Musculoskeletal:       Right hip: He exhibits decreased range of motion, tenderness, bony tenderness and deformity. He exhibits no swelling, no crepitus and no laceration.  Neurological: He is alert and oriented to person, place, and time. He exhibits normal muscle tone. Coordination normal.  Skin: Skin is warm and dry. No rash noted. No erythema.  Psychiatric: He has a normal mood and affect. His behavior is normal.  Nursing note and vitals reviewed.   ED Course  Procedures (including critical care time) Labs Review Labs Reviewed  BASIC METABOLIC PANEL  CBC WITH DIFFERENTIAL/PLATELET    Imaging Review Dg Hip Unilat With Pelvis 2-3 Views Right  07/18/2015  CLINICAL DATA:  Right hip pain since Thursday.  Initial encounter. EXAM: DG  HIP (WITH OR WITHOUT PELVIS) 2-3V RIGHT COMPARISON:  07/01/2015 FINDINGS: Acute basicervical right femoral neck fracture which also involves the greater trochanter. No intertrochanteric extension identified. There is varus angulation at the neck fracture from displacement. No dislocation. Recent proximal left femur fracture with progressive varus angulation especially when compared to operative fluoroscopy. Lumbosacral spine fixation with decompressive laminectomies. IMPRESSION: 1. Displaced basicervical right femoral neck fracture. Right greater trochanter fracture. 2. Recent left femoral neck fracture with progressive varus angulation. Electronically Signed   By: Marnee Spring M.D.   On: 07/18/2015 23:44   I have personally reviewed and evaluated these images and lab results as part of my medical decision-making.  Spoke with Dr. Ophelia Charter orthopedics, who is on for Dr. Roda Shutters, and we will have the patient admitted by the, [facility].  Patient is advised plan and all questions were answered    Charlestine Night, PA-C 07/19/15 0029  Richardean Canal, MD 07/19/15 1455

## 2015-07-18 NOTE — ED Notes (Signed)
Pt transported from [facility] with confirmed right femoral head fx on xray. Pt had left hip repaired x 2 weeks. Pain in R began 3 days ago after PT. IVest. Fentanyl given.

## 2015-07-18 NOTE — ED Notes (Signed)
Patient transported to X-ray 

## 2015-07-18 NOTE — ED Notes (Addendum)
Pt complaining of painful spasms in his legs. States that they are keeping him from sleeping.

## 2015-07-19 DIAGNOSIS — Z89412 Acquired absence of left great toe: Secondary | ICD-10-CM | POA: Diagnosis not present

## 2015-07-19 DIAGNOSIS — S72001A Fracture of unspecified part of neck of right femur, initial encounter for closed fracture: Secondary | ICD-10-CM | POA: Diagnosis not present

## 2015-07-19 DIAGNOSIS — Z794 Long term (current) use of insulin: Secondary | ICD-10-CM

## 2015-07-19 DIAGNOSIS — M80051A Age-related osteoporosis with current pathological fracture, right femur, initial encounter for fracture: Secondary | ICD-10-CM | POA: Diagnosis not present

## 2015-07-19 DIAGNOSIS — Z79899 Other long term (current) drug therapy: Secondary | ICD-10-CM | POA: Diagnosis not present

## 2015-07-19 DIAGNOSIS — E1152 Type 2 diabetes mellitus with diabetic peripheral angiopathy with gangrene: Secondary | ICD-10-CM | POA: Diagnosis not present

## 2015-07-19 DIAGNOSIS — E43 Unspecified severe protein-calorie malnutrition: Secondary | ICD-10-CM | POA: Diagnosis not present

## 2015-07-19 DIAGNOSIS — E861 Hypovolemia: Secondary | ICD-10-CM | POA: Diagnosis present

## 2015-07-19 DIAGNOSIS — Z6823 Body mass index (BMI) 23.0-23.9, adult: Secondary | ICD-10-CM | POA: Diagnosis not present

## 2015-07-19 DIAGNOSIS — E871 Hypo-osmolality and hyponatremia: Secondary | ICD-10-CM | POA: Diagnosis not present

## 2015-07-19 DIAGNOSIS — Z7984 Long term (current) use of oral hypoglycemic drugs: Secondary | ICD-10-CM | POA: Diagnosis not present

## 2015-07-19 DIAGNOSIS — M25551 Pain in right hip: Secondary | ICD-10-CM | POA: Diagnosis present

## 2015-07-19 DIAGNOSIS — R338 Other retention of urine: Secondary | ICD-10-CM | POA: Diagnosis not present

## 2015-07-19 DIAGNOSIS — E118 Type 2 diabetes mellitus with unspecified complications: Secondary | ICD-10-CM | POA: Diagnosis not present

## 2015-07-19 DIAGNOSIS — D509 Iron deficiency anemia, unspecified: Secondary | ICD-10-CM | POA: Diagnosis present

## 2015-07-19 DIAGNOSIS — T402X5A Adverse effect of other opioids, initial encounter: Secondary | ICD-10-CM | POA: Diagnosis not present

## 2015-07-19 DIAGNOSIS — N401 Enlarged prostate with lower urinary tract symptoms: Secondary | ICD-10-CM | POA: Diagnosis not present

## 2015-07-19 LAB — BASIC METABOLIC PANEL
ANION GAP: 9 (ref 5–15)
Anion gap: 9 (ref 5–15)
BUN: 10 mg/dL (ref 6–20)
BUN: 10 mg/dL (ref 6–20)
CHLORIDE: 94 mmol/L — AB (ref 101–111)
CO2: 24 mmol/L (ref 22–32)
CO2: 25 mmol/L (ref 22–32)
Calcium: 9.3 mg/dL (ref 8.9–10.3)
Calcium: 9.4 mg/dL (ref 8.9–10.3)
Chloride: 94 mmol/L — ABNORMAL LOW (ref 101–111)
Creatinine, Ser: 0.77 mg/dL (ref 0.61–1.24)
Creatinine, Ser: 0.84 mg/dL (ref 0.61–1.24)
GFR calc Af Amer: >60 mL/min (ref >60)
GFR calc non Af Amer: >60 mL/min (ref >60)
Glucose, Bld: 149 mg/dL — ABNORMAL HIGH (ref 65–99)
Glucose, Bld: 156 mg/dL — ABNORMAL HIGH (ref 65–99)
POTASSIUM: 4.3 mmol/L (ref 3.5–5.1)
Potassium: 4.8 mmol/L (ref 3.5–5.1)
SODIUM: 127 mmol/L — AB (ref 135–145)
Sodium: 128 mmol/L — ABNORMAL LOW (ref 135–145)

## 2015-07-19 LAB — GLUCOSE, CAPILLARY
GLUCOSE-CAPILLARY: 118 mg/dL — AB (ref 65–99)
GLUCOSE-CAPILLARY: 130 mg/dL — AB (ref 65–99)
GLUCOSE-CAPILLARY: 139 mg/dL — AB (ref 65–99)
GLUCOSE-CAPILLARY: 141 mg/dL — AB (ref 65–99)
GLUCOSE-CAPILLARY: 146 mg/dL — AB (ref 65–99)
Glucose-Capillary: 115 mg/dL — ABNORMAL HIGH (ref 65–99)

## 2015-07-19 LAB — SURGICAL PCR SCREEN
MRSA, PCR: POSITIVE — AB
STAPHYLOCOCCUS AUREUS: POSITIVE — AB

## 2015-07-19 MED ORDER — DEXTROSE-NACL 5-0.9 % IV SOLN
INTRAVENOUS | Status: DC
  Administered 2015-07-19: 03:00:00 via INTRAVENOUS

## 2015-07-19 MED ORDER — METHOCARBAMOL 1000 MG/10ML IJ SOLN
500.0000 mg | Freq: Four times a day (QID) | INTRAVENOUS | Status: DC | PRN
  Administered 2015-07-19 – 2015-07-21 (×2): 500 mg via INTRAVENOUS
  Filled 2015-07-19 (×5): qty 5

## 2015-07-19 MED ORDER — SULFAMETHOXAZOLE-TRIMETHOPRIM 800-160 MG PO TABS
1.0000 | ORAL_TABLET | Freq: Two times a day (BID) | ORAL | Status: DC
  Administered 2015-07-19 – 2015-07-23 (×11): 1 via ORAL
  Filled 2015-07-19 (×12): qty 1

## 2015-07-19 MED ORDER — TAMSULOSIN HCL 0.4 MG PO CAPS
0.4000 mg | ORAL_CAPSULE | Freq: Every day | ORAL | Status: DC
  Administered 2015-07-20 – 2015-07-28 (×9): 0.4 mg via ORAL
  Filled 2015-07-19 (×10): qty 1

## 2015-07-19 MED ORDER — SODIUM CHLORIDE 0.9 % IV SOLN
INTRAVENOUS | Status: DC
  Administered 2015-07-19: 100 mL/h via INTRAVENOUS
  Administered 2015-07-20 (×3): via INTRAVENOUS

## 2015-07-19 MED ORDER — HYDROMORPHONE HCL 1 MG/ML IJ SOLN
0.5000 mg | INTRAMUSCULAR | Status: DC | PRN
  Administered 2015-07-19 – 2015-07-24 (×18): 0.5 mg via INTRAVENOUS
  Filled 2015-07-19 (×18): qty 1

## 2015-07-19 MED ORDER — HYDROCODONE-ACETAMINOPHEN 5-325 MG PO TABS
1.0000 | ORAL_TABLET | Freq: Four times a day (QID) | ORAL | Status: DC | PRN
  Administered 2015-07-19 – 2015-07-21 (×9): 2 via ORAL
  Filled 2015-07-19 (×9): qty 2

## 2015-07-19 MED ORDER — POLYETHYLENE GLYCOL 3350 17 G PO PACK
17.0000 g | PACK | Freq: Two times a day (BID) | ORAL | Status: DC
  Administered 2015-07-19 – 2015-07-20 (×2): 17 g via ORAL
  Filled 2015-07-19 (×6): qty 1

## 2015-07-19 MED ORDER — SENNOSIDES-DOCUSATE SODIUM 8.6-50 MG PO TABS
1.0000 | ORAL_TABLET | Freq: Two times a day (BID) | ORAL | Status: DC
  Administered 2015-07-19 – 2015-07-28 (×11): 1 via ORAL
  Filled 2015-07-19 (×17): qty 1

## 2015-07-19 MED ORDER — ENSURE ENLIVE PO LIQD
237.0000 mL | Freq: Two times a day (BID) | ORAL | Status: DC
  Administered 2015-07-20 – 2015-07-22 (×3): 237 mL via ORAL

## 2015-07-19 MED ORDER — BISACODYL 10 MG RE SUPP
10.0000 mg | Freq: Once | RECTAL | Status: DC
  Filled 2015-07-19: qty 1

## 2015-07-19 MED ORDER — METHOCARBAMOL 500 MG PO TABS
500.0000 mg | ORAL_TABLET | Freq: Four times a day (QID) | ORAL | Status: DC | PRN
  Administered 2015-07-19 – 2015-07-20 (×6): 500 mg via ORAL
  Filled 2015-07-19 (×7): qty 1

## 2015-07-19 MED ORDER — DOCUSATE SODIUM 100 MG PO CAPS
100.0000 mg | ORAL_CAPSULE | Freq: Two times a day (BID) | ORAL | Status: DC
  Administered 2015-07-19 – 2015-07-21 (×3): 100 mg via ORAL
  Filled 2015-07-19 (×5): qty 1

## 2015-07-19 MED ORDER — MAGNESIUM HYDROXIDE 400 MG/5ML PO SUSP: 30.0000 mL | Freq: Every day | ORAL | Status: DC | PRN

## 2015-07-19 MED ORDER — ACETAMINOPHEN 325 MG PO TABS
650.0000 mg | ORAL_TABLET | Freq: Four times a day (QID) | ORAL | Status: DC | PRN
Start: 2015-07-19 — End: 2015-07-28
  Filled 2015-07-19: qty 2

## 2015-07-19 NOTE — H&P (Signed)
[facility] History and Physical  Jeff Ferrell ZOX:[id number] DOB: 02-05-1951 DOA: 07/18/2015  Referring physician: EDP PCP: [name], MD   Chief Complaint: R hip pain   HPI: Jeff Ferrell is a 65 y.o. male who was just treated for LEFT hip fracture on 06/18/15, still in the SNF recovering from that.  Patient presents to ED with new onset RIGHT hip pain.  Symptoms of R hip pain that he had been having this month became suddenly worse on Thursday this week, while he was transferring from his wheelchair to the bed.  There was no fall or trauma associated with the onset of his hip pain.  Pain is severe, worse with any movement, associated with muscle spasms.  Somehow, despite absence of trauma or major risk factors for early osteporosis, this unfortunate man has managed to have his second hip fracture this month!  Right hip this time.  Was admitted earlier this month with R hip pain, images at that time were read as "no acute abnormality".  Review of Systems: Systems reviewed.  As above, otherwise negative  Past Medical History  Diagnosis Date  . Diabetes mellitus     regulated by diet  . Chronic back pain   . 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 Diabetic osteomyelitis (HCC) 03/29/2015  . Diabetic foot ulcer (HCC) 03/29/2015   Past Surgical History  Procedure Laterality Date  . Back surgery      screws & hardware to lower back  . 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 Femur im nail Left 06/18/2015    Procedure: INTRAMEDULLARY (IM) NAIL FEMORAL;  Surgeon: Tarry Kos, MD;  Location: [facility];  Service: Orthopedics;  Laterality: Left;   Social History:  reports that he has never smoked. He does not have any smokeless tobacco history on file. He reports that he drinks about 3.6 oz of alcohol per week. He reports that he does not use  illicit drugs.  Allergies  Allergen Reactions  . Penicillins Hives and Rash    Has patient had a PCN reaction causing immediate rash, facial/tongue/throat swelling, SOB or lightheadedness with hypotension: Yes Has patient had a PCN reaction causing severe rash involving mucus membranes or skin necrosis: Yes   Has patient had a PCN reaction that required hospitalization No Has patient had a PCN reaction occurring within the last 10 years: No If all of the above answers are "NO", then may proceed with Cephalosporin use.     History reviewed. No pertinent family history.   Prior to Admission medications   Medication Sig Start Date End Date Taking? Authorizing Provider  acetaminophen (TYLENOL) 325 MG tablet Take 2 tablets (650 mg total) by mouth every 6 (six) hours as needed for mild pain (or Fever >/= 101). 07/06/15  Yes Osvaldo Shipper, MD  bisacodyl (DULCOLAX) 10 MG suppository Place 10 mg rectally once.   Yes Historical Provider, MD  docusate sodium (COLACE) 100 MG capsule Take 1 capsule (100 mg total) by mouth 2 (two) times daily. 07/06/15  Yes Osvaldo Shipper, MD  enoxaparin (LOVENOX) 40 MG/0.4ML injection Inject 0.4 mLs (40 mg total) into the skin daily. For 3 weeks 07/06/15  Yes Osvaldo Shipper, MD  magnesium hydroxide (MILK OF MAGNESIA) 400 MG/5ML suspension Take 30 mLs by mouth daily as needed for mild constipation.   Yes Historical Provider, MD  methocarbamol (ROBAXIN) 500 MG tablet  Take 1 tablet (500 mg total) by mouth 3 (three) times daily. 07/06/15  Yes Osvaldo Shipper, MD  oxyCODONE (OXY IR/ROXICODONE) 5 MG immediate release tablet Take 1-3 tablets (5-15 mg total) by mouth every 4 (four) hours as needed. Patient taking differently: Take 5-15 mg by mouth every 4 (four) hours as needed (mild to moderate to severe pain.). 5 mg=mild pain 10 mg=moderate pain 15=severe pain. 07/06/15  Yes Osvaldo Shipper, MD  oxyCODONE-acetaminophen (PERCOCET) 7.5-325 MG tablet Take 1 tablet by mouth 3 (three)  times daily. 07/06/15  Yes Osvaldo Shipper, MD  polyethylene glycol (MIRALAX / GLYCOLAX) packet Take 17 g by mouth 2 (two) times daily. 07/06/15  Yes Osvaldo Shipper, MD  sennosides-docusate sodium (SENOKOT-S) 8.6-50 MG tablet Take 1 tablet by mouth 2 (two) times daily.   Yes Historical Provider, MD  sitaGLIPtin (JANUVIA) 50 MG tablet Take 1 tablet (50 mg total) by mouth daily. 06/23/15  Yes Catarina Hartshorn, MD  sodium phosphate (FLEET) enema Place 1 enema rectally once. follow package directions   Yes Historical Provider, MD  sulfamethoxazole-trimethoprim (BACTRIM DS,SEPTRA DS) 800-160 MG tablet Take 1 tablet by mouth every 12 (twelve) hours. For 10 days Patient taking differently: Take 1 tablet by mouth every 12 (twelve) hours. For 14 days therapy course began 07/12/2015. 07/06/15  Yes Osvaldo Shipper, MD  tamsulosin (FLOMAX) 0.4 MG CAPS capsule Take 1 capsule (0.4 mg total) by mouth daily. 06/21/15  Yes Catarina Hartshorn, MD   Physical Exam: Filed Vitals:   07/18/15 2138 07/18/15 2343  BP: 102/63 111/64  Pulse: 124 120  Temp: 98.3 F (36.8 C)   Resp: 20 16    BP 111/64 mmHg  Pulse 120  Temp(Src) 98.3 F (36.8 C) (Oral)  Resp 16  SpO2 92%  General Appearance:    Alert, oriented, no distress, appears stated age  Head:    Normocephalic, atraumatic  Eyes:    PERRL, EOMI, sclera non-icteric        Nose:   Nares without drainage or epistaxis. Mucosa, turbinates normal  Throat:   Moist mucous membranes. Oropharynx without erythema or exudate.  Neck:   Supple. No carotid bruits.  No thyromegaly.  No lymphadenopathy.   Back:     No CVA tenderness, no spinal tenderness  Lungs:     Clear to auscultation bilaterally, without wheezes, rhonchi or rales  Chest wall:    No tenderness to palpitation  Heart:    Regular rate and rhythm without murmurs, gallops, rubs  Abdomen:     Soft, non-tender, nondistended, normal bowel sounds, no organomegaly  Genitalia:    deferred  Rectal:    deferred  Extremities:   No  clubbing, cyanosis or edema.  Pulses:   2+ and symmetric all extremities  Skin:   Skin color, texture, turgor normal, no rashes or lesions  Lymph nodes:   Cervical, supraclavicular, and axillary nodes normal  Neurologic:   CNII-XII intact. Normal strength, sensation and reflexes      throughout    Labs on Admission:  Basic Metabolic Panel:  Recent Labs Lab 07/18/15 2339  NA 128*  K 4.8  CL 94*  CO2 25  GLUCOSE 156*  BUN 10  CREATININE 0.84  CALCIUM 9.4   Liver Function Tests: No results for input(s): AST, ALT, ALKPHOS, BILITOT, PROT, ALBUMIN in the last 168 hours. No results for input(s): LIPASE, AMYLASE in the last 168 hours. No results for input(s): AMMONIA in the last 168 hours. CBC:  Recent Labs Lab 07/18/15 2339  WBC 7.2  NEUTROABS 5.7  HGB 11.1*  HCT 33.6*  MCV 84.6  PLT 369   Cardiac Enzymes: No results for input(s): CKTOTAL, CKMB, CKMBINDEX, TROPONINI in the last 168 hours.  BNP (last 3 results) No results for input(s): PROBNP in the last 8760 hours. CBG: No results for input(s): GLUCAP in the last 168 hours.  Radiological Exams on Admission: Dg Hip Unilat With Pelvis 2-3 Views Right  07/18/2015  CLINICAL DATA:  Right hip pain since Thursday.  Initial encounter. EXAM: DG HIP (WITH OR WITHOUT PELVIS) 2-3V RIGHT COMPARISON:  07/01/2015 FINDINGS: Acute basicervical right femoral neck fracture which also involves the greater trochanter. No intertrochanteric extension identified. There is varus angulation at the neck fracture from displacement. No dislocation. Recent proximal left femur fracture with progressive varus angulation especially when compared to operative fluoroscopy. Lumbosacral spine fixation with decompressive laminectomies. IMPRESSION: 1. Displaced basicervical right femoral neck fracture. Right greater trochanter fracture. 2. Recent left femoral neck fracture with progressive varus angulation. Electronically Signed   By: Marnee Spring M.D.   On:  07/18/2015 23:44    EKG: Independently reviewed.  Assessment/Plan Principal Problem:   Fracture of femoral neck, right, closed Active Problems:   Diabetes mellitus (HCC)   Femoral neck fracture, right, closed, initial encounter   1. Fracture of R femoral neck - non-traumatic R femoral neck fracture, this 1 month after a non-traumatic L hip fracture. 1. Hip fracture pathway 2. Holding lovenox that he was still on for DVT ppx from the L hip fracture a month ago 3. NPO 4. Pain control and muscle relaxants 5. Ortho to see in AM presumably for surgery 6. I am unclear where his profound osteoperosis at such a young age is coming from: 1. No renal disease 2. Normal Calcium, so parathyroid is likely to be normal 3. Normal TSH 4. No chronic steroids 2. DM2 - diet controlled, does have history of foot ulcers though, CBG checks Q4H  Dr. Ophelia Charter consulted and they will see patient in AM  Code Status: Full  Family Communication: No family in room Disposition Plan: Admit to inpatient   Time spent: 70 min  GARDNER, JARED M. [facility] Pager [phone]  If 7AM-7PM, please contact the day team taking care of the patient [email or web address] Password [email or web address] 07/19/2015, 12:57 AM

## 2015-07-19 NOTE — Progress Notes (Signed)
Initial Nutrition Assessment  DOCUMENTATION CODES:   Severe malnutrition in context of acute illness/injury  INTERVENTION:   Diet advancement per MD Once diet is advanced, recommend Ensure Enlive po BID, each supplement provides 350 kcal and 20 grams of protein RD to continue to monitor  NUTRITION DIAGNOSIS:   Inadequate oral intake related to poor appetite as evidenced by per patient/family report.  GOAL:   Patient will meet greater than or equal to 90% of their needs  MONITOR:   Diet advancement, Labs, Weight trends, Skin, I & O's  REASON FOR ASSESSMENT:   Consult Hip fracture protocol  ASSESSMENT:   65 year old African-American male who was recently hospitalized for a left fracture and then rehospitalized for wound infection and uncontrolled pain and muscle spasms. He had refused placement to SNF after his first admission. After he was hospitalized again, he agreed to go to SNF and was discharged in stable condition. His wound infection had improved with the antibiotics. He was doing well at the skilled nursing facility getting rehabilitation. He presented with acute worsening in right hip pain over the last 2-3 days and has been found to have a fracture involving his right hip now.   Patient in room experiencing some discomfort from reported muscle spasms. Pt reports having a poor appetite for a couple of months since having surgeries on his back and L hip. He will at most eat 2 meals a day usually skipping breakfast. Pt has lost weight since his last hip surgery in December (14% weight loss x 1 month, significant for time frame). Once diet is advanced, pt would like ensure supplements (which are already ordered in the system). Pt prefers chocolate.  Pt with mild-moderate fat and muscle wasting.  Labs reviewed: CBGs: 139-146 Low Na  Diet Order:  Diet NPO time specified  Skin:  Wound (see comment) (L hip incision)  Last BM:  1/28  Height:   Ht Readings from Last 1  Encounters:  07/19/15  (1.753 m)    Weight:   Wt Readings from Last 1 Encounters:  07/19/15 159 lb 2.8 oz (72.2 kg)    Ideal Body Weight:  72.7 kg  BMI:  Body mass index is 23.49 kg/(m^2).  Estimated Nutritional Needs:   Kcal:  2100-2300  Protein:  100-110g  Fluid:  2.1L/day  EDUCATION NEEDS:   Education needs no appropriate at this time  Tilda Franco, MS, RD, LDN Pager: [phone] After Hours Pager: [phone]

## 2015-07-19 NOTE — Progress Notes (Signed)
[facility] PROGRESS NOTE  Jeff Ferrell WUJ:[id number] DOB: Dec 20, 1950 DOA: 07/18/2015  PCP: [name], MD  Brief HPI: 65 year old African-American male who was recently hospitalized for a left fracture and then rehospitalized for wound infection and uncontrolled pain and muscle spasms. He had refused placement to SNF after his first admission. After he was hospitalized again, he agreed to go to SNF and was discharged in stable condition. His wound infection had improved with the antibiotics. He was doing well at the skilled nursing facility getting rehabilitation. He presented with acute worsening in right hip pain over the last 2-3 days and has been found to have a fracture involving his right hip now. No history of trauma or falls.  Past medical history:  Past Medical History  Diagnosis Date  . Diabetes mellitus     regulated by diet  . Chronic back pain   . 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 Diabetic osteomyelitis (HCC) 03/29/2015  . Diabetic foot ulcer (HCC) 03/29/2015    Consultants: Orthopedics  Procedures: None yet  Antibiotics: Bactrim being continued  Subjective: Patient complains of severe pain in his right hip as well as muscle spasm involving his right leg. He states that his left leg is much better.  Objective: Vital Signs  Filed Vitals:   07/18/15 2343 07/19/15 0131 07/19/15 0200 07/19/15 0652  BP: 111/64  112/64 115/58  Pulse: 120  115 112  Temp:   98.9 F (37.2 C) 97.8 F (36.6 C)  TempSrc:   Oral Oral  Resp: Height:   (1.753 m)    Weight:  72.2 kg (159 lb 2.8 oz)    SpO2: 92%  93% 96%    Intake/Output Summary (Last 24 hours) at 07/19/15 1047 Last data filed at 07/19/15 9562  Gross per 24 hour  Intake      0 ml  Output   1150 ml  Net  -1150 ml   Filed Weights   07/19/15 0131  Weight: 72.2 kg (159 lb 2.8 oz)    General appearance: alert, cooperative, appears stated age, no distress and  uncooperative Resp: clear to auscultation bilaterally Cardio: regular rate and rhythm, S1, S2 normal, no murmur, click, rub or gallop GI: soft, non-tender; bowel sounds normal; no masses,  no organomegaly Extremities: Limited range of motion in the right lower extremity. Neurological: Alert and oriented 3. Cranial nerves II-12 intact. Motor strength is equal bilateral upper extremities. Normal strength in the left lower extremity.  Lab Results:  Basic Metabolic Panel:  Recent Labs Lab 07/18/15 2339 07/19/15 0835  NA 128* 127*  K 4.8 4.3  CL 94* 94*  CO2 25 24  GLUCOSE 156* 149*  BUN 10 10  CREATININE 0.84 0.77  CALCIUM 9.4 9.3   CBC:  Recent Labs Lab 07/18/15 2339  WBC 7.2  NEUTROABS 5.7  HGB 11.1*  HCT 33.6*  MCV 84.6  PLT 369   CBG:  Recent Labs Lab 07/19/15 0412 07/19/15 0755  GLUCAP 141* 146*    No results found for this or any previous visit (from the past 240 hour(s)).    Studies/Results: Dg Hip Unilat With Pelvis 2-3 Views Right  07/18/2015  CLINICAL DATA:  Right hip pain since Thursday.  Initial encounter. EXAM: DG HIP (WITH OR WITHOUT PELVIS) 2-3V RIGHT COMPARISON:  07/01/2015 FINDINGS: Acute basicervical right femoral neck fracture which also involves the greater trochanter. No intertrochanteric extension identified. There is varus angulation at the neck fracture from displacement. No  dislocation. Recent proximal left femur fracture with progressive varus angulation especially when compared to operative fluoroscopy. Lumbosacral spine fixation with decompressive laminectomies. IMPRESSION: 1. Displaced basicervical right femoral neck fracture. Right greater trochanter fracture. 2. Recent left femoral neck fracture with progressive varus angulation. Electronically Signed   By: Marnee Spring M.D.   On: 07/18/2015 23:44    Medications:  Scheduled: . bisacodyl  10 mg Rectal Once  . docusate sodium  100 mg Oral BID  . polyethylene glycol  17 g Oral BID   . senna-docusate  1 tablet Oral BID  . sulfamethoxazole-trimethoprim  1 tablet Oral Q12H  . tamsulosin  0.4 mg Oral Daily   Continuous: . sodium chloride 100 mL/hr (07/19/15 0815)   ZOX:[id number], HYDROcodone-acetaminophen, HYDROmorphone (DILAUDID) injection, magnesium hydroxide, methocarbamol **OR** methocarbamol (ROBAXIN)  IV  Assessment/Plan:  Principal Problem:   Fracture of femoral neck, right, closed Active Problems:   Diabetes mellitus (HCC)   Femoral neck fracture, right, closed, initial encounter    Right hip fracture This is a nontraumatic fracture. Patient had fracture of his left hip about a month ago and required surgery. Patient will need metabolic workup and formal evaluation for osteoporosis, preferably by endocrinology. Patient does not seem to have any obvious risk factors for premature osteoporosis. Pain control. Await orthopedic input. He will need surgical intervention.  Recent left hip fracture with wound infection Appears to be much improved per patient. Continue Bactrim for now  History of diabetes mellitus type 2 Monitor CBGs. Holding his oral medications. Sliding scale insulin has been initiated.  Hyponatremia Has been noted to be low previously as well. Likely low due to poor oral intake and hypovolemia. Change IV fluids to normal saline. Repeat tomorrow morning.  Malnutrition of moderate degree Nutritional supplements  DVT Prophylaxis: Patient was on Lovenox at SNF. This has been held for his surgery. Will be reinitiated postoperatively.    Code Status: Full code  Family Communication: Discussed with the patient  Disposition Plan: Await orthopedic input.    LOS: 0 days   [facility]  [facility] Pager [phone] 07/19/2015, 10:47 AM  If 7PM-7AM, please contact night-coverage at [email or web address], password [facility]

## 2015-07-19 NOTE — ED Notes (Signed)
Hospitalist at bedside 

## 2015-07-20 ENCOUNTER — Inpatient Hospital Stay (HOSPITAL_COMMUNITY): Payer: Medicare Other

## 2015-07-20 DIAGNOSIS — E43 Unspecified severe protein-calorie malnutrition: Secondary | ICD-10-CM

## 2015-07-20 LAB — GLUCOSE, CAPILLARY
GLUCOSE-CAPILLARY: 102 mg/dL — AB (ref 65–99)
GLUCOSE-CAPILLARY: 106 mg/dL — AB (ref 65–99)
GLUCOSE-CAPILLARY: 118 mg/dL — AB (ref 65–99)
GLUCOSE-CAPILLARY: 137 mg/dL — AB (ref 65–99)
Glucose-Capillary: 106 mg/dL — ABNORMAL HIGH (ref 65–99)
Glucose-Capillary: 114 mg/dL — ABNORMAL HIGH (ref 65–99)

## 2015-07-20 LAB — BASIC METABOLIC PANEL
ANION GAP: 9 (ref 5–15)
BUN: 11 mg/dL (ref 6–20)
CALCIUM: 9.3 mg/dL (ref 8.9–10.3)
CO2: 23 mmol/L (ref 22–32)
Chloride: 100 mmol/L — ABNORMAL LOW (ref 101–111)
Creatinine, Ser: 0.64 mg/dL (ref 0.61–1.24)
Glucose, Bld: 127 mg/dL — ABNORMAL HIGH (ref 65–99)
Potassium: 4.8 mmol/L (ref 3.5–5.1)
SODIUM: 132 mmol/L — AB (ref 135–145)

## 2015-07-20 LAB — CBC
HCT: 32.7 % — ABNORMAL LOW (ref 39.0–52.0)
HEMOGLOBIN: 10.6 g/dL — AB (ref 13.0–17.0)
MCH: 27.6 pg (ref 26.0–34.0)
MCHC: 32.4 g/dL (ref 30.0–36.0)
MCV: 85.2 fL (ref 78.0–100.0)
PLATELETS: 409 10*3/uL — AB (ref 150–400)
RBC: 3.84 MIL/uL — AB (ref 4.22–5.81)
RDW: 13.5 % (ref 11.5–15.5)
WBC: 8.5 10*3/uL (ref 4.0–10.5)

## 2015-07-20 MED ORDER — MUPIROCIN 2 % EX OINT
1.0000 "application " | TOPICAL_OINTMENT | Freq: Two times a day (BID) | CUTANEOUS | Status: AC
  Administered 2015-07-20 – 2015-07-24 (×9): 1 via NASAL
  Filled 2015-07-20: qty 22

## 2015-07-20 MED ORDER — CEFAZOLIN SODIUM-DEXTROSE 2-3 GM-% IV SOLR
2.0000 g | Freq: Once | INTRAVENOUS | Status: DC
  Filled 2015-07-20: qty 50

## 2015-07-20 MED ORDER — CHLORHEXIDINE GLUCONATE CLOTH 2 % EX PADS
6.0000 | MEDICATED_PAD | Freq: Every day | CUTANEOUS | Status: AC
  Administered 2015-07-21 – 2015-07-25 (×5): 6 via TOPICAL

## 2015-07-20 NOTE — Care Management Note (Signed)
Case Management Note  Patient Details  Name: Jayleon Mcfarlane MRN: [id number] Date of Birth: 03/18/1951  Subjective/Objective:       Fracture of femoral neck, right, closed             Action/Plan: Transfer to [facility] for surgery  Expected Discharge Date:                  Expected Discharge Plan:  Acute to Acute Transfer  In-House Referral:  NA  Discharge planning Services  CM Consult  Post Acute Care Choice:  NA Choice offered to:  NA  DME Arranged:  N/A DME Agency:  NA  HH Arranged:  NA HH Agency:  NA  Status of Service:  Completed, signed off  Medicare Important Message Given:    Date Medicare IM Given:    Medicare IM give by:    Date Additional Medicare IM Given:    Additional Medicare Important Message give by:     If discussed at Long Length of Stay Meetings, dates discussed:    Additional Comments:  Alexis Goodell, RN 07/20/2015, 2:49 PM

## 2015-07-20 NOTE — Progress Notes (Signed)
Surgery is scheduled for Wednesday at [facility].  [facility].  Hold DVT ppx, NPO p MN.  Mayra Reel, MD [facility] [phone] 7:59 AM

## 2015-07-20 NOTE — Progress Notes (Signed)
Attempted report to [facility]. No answer. x1

## 2015-07-20 NOTE — Progress Notes (Signed)
Patient being transferred to [facility] for surgery on 07/21/15. Per MD request patient has transfer order in place and bed ready.

## 2015-07-20 NOTE — Progress Notes (Signed)
[facility] PROGRESS NOTE  Jeff Ferrell RUE:[id number] DOB: September 24, 1950 DOA: 07/18/2015  PCP: [name], MD  Brief HPI: 65 year old African-American male who was recently hospitalized for a left fracture and then rehospitalized for wound infection and uncontrolled pain and muscle spasms. He had refused placement to SNF after his first admission. After he was hospitalized again, he agreed to go to SNF and was discharged in stable condition. His wound infection had improved with the antibiotics. He was doing well at the skilled nursing facility getting rehabilitation. He presented with acute worsening in right hip pain over the last 2-3 days and has been found to have a fracture involving his right hip now. No history of trauma or falls.  Past medical history:  Past Medical History  Diagnosis Date  . Diabetes mellitus     regulated by diet  . Chronic back pain   . 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 Diabetic osteomyelitis (HCC) 03/29/2015  . Diabetic foot ulcer (HCC) 03/29/2015    Consultants: Orthopedics  Procedures: Surgery planned for 2/1  Antibiotics: Bactrim being continued for wound infection  Subjective: Patient did not have a good night due to pain in the right hip. Denies any chest pain or shortness of breath.   Objective: Vital Signs  Filed Vitals:   07/19/15 0652 07/19/15 1409 07/19/15 2208 07/20/15 0547  BP: 115/58 102/64 132/88 125/64  Pulse: 112 114 113 101  Temp: 97.8 F (36.6 C) 98 F (36.7 C) 97.3 F (36.3 C) 98.7 F (37.1 C)  TempSrc: Oral Oral Oral Oral  Resp: Height:      Weight:      SpO2: 96% 94% 97% 97%    Intake/Output Summary (Last 24 hours) at 07/20/15 0804 Last data filed at 07/20/15 0600  Gross per 24 hour  Intake   2235 ml  Output   1200 ml  Net   1035 ml   Filed Weights   07/19/15 0131  Weight: 72.2 kg (159 lb 2.8 oz)    General appearance: alert, cooperative, appears stated age, no  distress Resp: clear to auscultation bilaterally Cardio: regular rate and rhythm, S1, S2 normal, no murmur, click, rub or gallop GI: soft, non-tender; bowel sounds normal; no masses,  no organomegaly Extremities: Limited range of motion in the right lower extremity. Left incision site wound shows yellowish exudate. No surrounding erythema. Neurological: Alert and oriented 3. Cranial nerves II-12 intact. Motor strength is equal bilateral upper extremities. Normal strength in the left lower extremity.  Lab Results:  Basic Metabolic Panel:  Recent Labs Lab 07/18/15 2339 07/19/15 0835 07/20/15 0509  NA 128* 127* 132*  K 4.8 4.3 4.8  CL 94* 94* 100*  CO2 GLUCOSE 156* 149* 127*  BUN CREATININE 0.84 0.77 0.64  CALCIUM 9.4 9.3 9.3   CBC:  Recent Labs Lab 07/18/15 2339 07/20/15 0509  WBC 7.2 8.5  NEUTROABS 5.7  --   HGB 11.1* 10.6*  HCT 33.6* 32.7*  MCV 84.6 85.2  PLT 369 409*   CBG:  Recent Labs Lab 07/19/15 1622 07/19/15 1958 07/19/15 2343 07/20/15 0358 07/20/15 0750  GLUCAP 118* 115* 130* 106* 102*    Recent Results (from the past 240 hour(s))  Surgical pcr screen     Status: Abnormal   Collection Time: 07/19/15  8:55 AM  Result Value Ref Range Status   MRSA, PCR POSITIVE (A) NEGATIVE Final    Comment: RESULT CALLED TO,  READ BACK BY AND VERIFIED WITH: AMY FARGO,RN [id number] @ 1417 BY J SCOTTON    Staphylococcus aureus POSITIVE (A) NEGATIVE Final    Comment:        The Xpert SA Assay (FDA approved for NASAL specimens in patients over 49 years of age), is one component of a comprehensive surveillance program.  Test performance has been validated by [facility] for patients greater than or equal to 25 year old. It is not intended to diagnose infection nor to guide or monitor treatment. RESULT CALLED TO, READ BACK BY AND VERIFIED WITH: AMY FARGO,RN [id number] @ 1417 BY J SCOTTON       Studies/Results: Dg Hip Unilat With Pelvis 2-3  Views Right  07/18/2015  CLINICAL DATA:  Right hip pain since Thursday.  Initial encounter. EXAM: DG HIP (WITH OR WITHOUT PELVIS) 2-3V RIGHT COMPARISON:  07/01/2015 FINDINGS: Acute basicervical right femoral neck fracture which also involves the greater trochanter. No intertrochanteric extension identified. There is varus angulation at the neck fracture from displacement. No dislocation. Recent proximal left femur fracture with progressive varus angulation especially when compared to operative fluoroscopy. Lumbosacral spine fixation with decompressive laminectomies. IMPRESSION: 1. Displaced basicervical right femoral neck fracture. Right greater trochanter fracture. 2. Recent left femoral neck fracture with progressive varus angulation. Electronically Signed   By: Marnee Spring M.D.   On: 07/18/2015 23:44    Medications:  Scheduled: . bisacodyl  10 mg Rectal Once  .  ceFAZolin (ANCEF) IV  2 g Intravenous Once  . docusate sodium  100 mg Oral BID  . feeding supplement (ENSURE ENLIVE)  237 mL Oral BID BM  . polyethylene glycol  17 g Oral BID  . senna-docusate  1 tablet Oral BID  . sulfamethoxazole-trimethoprim  1 tablet Oral Q12H  . tamsulosin  0.4 mg Oral Daily   Continuous: . sodium chloride 100 mL/hr at 07/20/15 0227   HYQ:[id number], HYDROcodone-acetaminophen, HYDROmorphone (DILAUDID) injection, magnesium hydroxide, methocarbamol **OR** methocarbamol (ROBAXIN)  IV  Assessment/Plan:  Principal Problem:   Fracture of femoral neck, right, closed Active Problems:   Diabetes mellitus (HCC)   Femoral neck fracture, right, closed, initial encounter   Protein-calorie malnutrition, severe    Right hip fracture This is a nontraumatic fracture. Patient had fracture of his left hip about a month ago and required surgery. Patient will need metabolic workup and formal evaluation for osteoporosis, preferably by endocrinology. Patient does not seem to have any obvious risk factors for premature  osteoporosis. Pain control. Plan is for surgery tomorrow at [facility]. Patient to be transferred to that hospital today. Discussed with Dr. Roda Shutters.   Recent left hip fracture with wound infection Wound continues to show yellowish exudate. No surrounding erythema. Continue Bactrim for now. Patient states that the pain is much improved.   History of diabetes mellitus type 2 Monitor CBGs. Holding his oral medications. Sliding scale insulin has been initiated.  Hyponatremia Improved with IV fluids. Has been noted to be low previously as well. Likely low due to poor oral intake and hypovolemia.   Malnutrition of moderate degree Nutritional supplements  DVT Prophylaxis: Patient was on Lovenox at SNF. This has been held for his surgery. Will need to be reinitiated postoperatively.    Code Status: Full code  Family Communication: Discussed with the patient  Disposition Plan: Plan is for surgery tomorrow morning. Transfer patient to [facility].    LOS: 1 day   [facility]  [facility] Pager [phone] 07/20/2015, 8:04 AM  If 7PM-7AM,  please contact night-coverage at [email or web address], password [facility]

## 2015-07-20 NOTE — Progress Notes (Signed)
[facility] arrived for patient transport to [facility]

## 2015-07-20 NOTE — Progress Notes (Signed)
Report given to nurse at [facility], Lauren.

## 2015-07-21 ENCOUNTER — Encounter (HOSPITAL_COMMUNITY)
Admission: EM | Disposition: A | Discharge status: Skilled Nursing Facility | Payer: Self-pay | Source: Home / Self Care | Attending: Family Medicine

## 2015-07-21 ENCOUNTER — Inpatient Hospital Stay (HOSPITAL_COMMUNITY): Payer: Medicare Other | Admitting: Anesthesiology

## 2015-07-21 ENCOUNTER — Encounter (HOSPITAL_COMMUNITY): Payer: Self-pay | Admitting: Certified Registered"

## 2015-07-21 ENCOUNTER — Inpatient Hospital Stay (HOSPITAL_COMMUNITY): Payer: Medicare Other

## 2015-07-21 HISTORY — PX: FEMUR IM NAIL: SHX1597

## 2015-07-21 LAB — GLUCOSE, CAPILLARY
GLUCOSE-CAPILLARY: 107 mg/dL — AB (ref 65–99)
GLUCOSE-CAPILLARY: 98 mg/dL (ref 65–99)
GLUCOSE-CAPILLARY: 99 mg/dL (ref 65–99)
GLUCOSE-CAPILLARY: 167 mg/dL — AB (ref 65–99)
Glucose-Capillary: 92 mg/dL (ref 65–99)

## 2015-07-21 LAB — CBC
HCT: 32.6 % — ABNORMAL LOW (ref 39.0–52.0)
Hemoglobin: 11.1 g/dL — ABNORMAL LOW (ref 13.0–17.0)
MCH: 28 pg (ref 26.0–34.0)
MCHC: 34 g/dL (ref 30.0–36.0)
MCV: 82.3 fL (ref 78.0–100.0)
PLATELETS: 337 10*3/uL (ref 150–400)
RBC: 3.96 MIL/uL — AB (ref 4.22–5.81)
RDW: 13.1 % (ref 11.5–15.5)
WBC: 7.5 10*3/uL (ref 4.0–10.5)

## 2015-07-21 LAB — BASIC METABOLIC PANEL
ANION GAP: 11 (ref 5–15)
BUN: 6 mg/dL (ref 6–20)
CALCIUM: 8.9 mg/dL (ref 8.9–10.3)
CO2: 22 mmol/L (ref 22–32)
CREATININE: 0.62 mg/dL (ref 0.61–1.24)
Chloride: 95 mmol/L — ABNORMAL LOW (ref 101–111)
GFR calc Af Amer: >60 mL/min (ref >60)
GFR calc non Af Amer: >60 mL/min (ref >60)
GLUCOSE: 106 mg/dL — AB (ref 65–99)
Potassium: 4 mmol/L (ref 3.5–5.1)
Sodium: 128 mmol/L — ABNORMAL LOW (ref 135–145)

## 2015-07-21 LAB — CREATININE, SERUM: CREATININE: 0.69 mg/dL (ref 0.61–1.24)

## 2015-07-21 SURGERY — INSERTION, INTRAMEDULLARY ROD, FEMUR
Anesthesia: General | Laterality: Right

## 2015-07-21 MED ORDER — HYDROCODONE-ACETAMINOPHEN 5-325 MG PO TABS
1.0000 | ORAL_TABLET | Freq: Four times a day (QID) | ORAL | Status: DC | PRN
  Administered 2015-07-22 (×2): 2 via ORAL
  Filled 2015-07-21 (×2): qty 2

## 2015-07-21 MED ORDER — LACTATED RINGERS IV SOLN
INTRAVENOUS | Status: DC
  Administered 2015-07-21: 12:00:00 via INTRAVENOUS

## 2015-07-21 MED ORDER — ONDANSETRON HCL 4 MG/2ML IJ SOLN
4.0000 mg | Freq: Four times a day (QID) | INTRAMUSCULAR | Status: DC | PRN
Start: 2015-07-21 — End: 2015-07-28

## 2015-07-21 MED ORDER — SODIUM CHLORIDE 0.9 % IV SOLN
INTRAVENOUS | Status: DC
  Administered 2015-07-21: 21:00:00 via INTRAVENOUS

## 2015-07-21 MED ORDER — PHENYLEPHRINE HCL 10 MG/ML IJ SOLN
10.0000 mg | INTRAVENOUS | Status: DC | PRN
  Administered 2015-07-21: 20 ug/min via INTRAVENOUS

## 2015-07-21 MED ORDER — ONDANSETRON HCL 4 MG PO TABS: 4.0000 mg | ORAL_TABLET | Freq: Four times a day (QID) | ORAL | Status: DC | PRN

## 2015-07-21 MED ORDER — CEFAZOLIN SODIUM-DEXTROSE 2-3 GM-% IV SOLR
2.0000 g | Freq: Four times a day (QID) | INTRAVENOUS | Status: AC
  Administered 2015-07-22: 2 g via INTRAVENOUS
  Filled 2015-07-21 (×4): qty 50

## 2015-07-21 MED ORDER — EPHEDRINE SULFATE 50 MG/ML IJ SOLN
INTRAMUSCULAR | Status: DC | PRN
  Administered 2015-07-21: 5 mg via INTRAVENOUS
  Administered 2015-07-21: 15 mg via INTRAVENOUS

## 2015-07-21 MED ORDER — LIDOCAINE HCL (CARDIAC) 20 MG/ML IV SOLN
INTRAVENOUS | Status: DC | PRN
  Administered 2015-07-21: 60 mg via INTRAVENOUS

## 2015-07-21 MED ORDER — HYDROMORPHONE HCL 1 MG/ML IJ SOLN
0.2500 mg | INTRAMUSCULAR | Status: DC | PRN
  Administered 2015-07-21: 0.5 mg via INTRAVENOUS

## 2015-07-21 MED ORDER — METOCLOPRAMIDE HCL 5 MG/ML IJ SOLN: 5.0000 mg | Freq: Three times a day (TID) | INTRAMUSCULAR | Status: DC | PRN

## 2015-07-21 MED ORDER — ONDANSETRON HCL 4 MG/2ML IJ SOLN
INTRAMUSCULAR | Status: AC
  Filled 2015-07-21: qty 2

## 2015-07-21 MED ORDER — SUGAMMADEX SODIUM 200 MG/2ML IV SOLN
INTRAVENOUS | Status: AC
  Filled 2015-07-21: qty 2

## 2015-07-21 MED ORDER — HYDROMORPHONE HCL 1 MG/ML IJ SOLN
INTRAMUSCULAR | Status: AC
  Filled 2015-07-21: qty 1

## 2015-07-21 MED ORDER — INSULIN ASPART 100 UNIT/ML ~~LOC~~ SOLN
0.0000 [IU] | SUBCUTANEOUS | Status: DC
  Administered 2015-07-21: 2 [IU] via SUBCUTANEOUS
  Administered 2015-07-22: 1 [IU] via SUBCUTANEOUS
  Administered 2015-07-22: 2 [IU] via SUBCUTANEOUS
  Administered 2015-07-22: 1 [IU] via SUBCUTANEOUS
  Administered 2015-07-22: 2 [IU] via SUBCUTANEOUS

## 2015-07-21 MED ORDER — ROCURONIUM BROMIDE 50 MG/5ML IV SOLN
INTRAVENOUS | Status: AC
  Filled 2015-07-21: qty 1

## 2015-07-21 MED ORDER — FENTANYL CITRATE (PF) 250 MCG/5ML IJ SOLN
INTRAMUSCULAR | Status: AC
  Filled 2015-07-21: qty 5

## 2015-07-21 MED ORDER — PROPOFOL 10 MG/ML IV BOLUS
INTRAVENOUS | Status: AC
  Filled 2015-07-21: qty 20

## 2015-07-21 MED ORDER — METHOCARBAMOL 1000 MG/10ML IJ SOLN
500.0000 mg | Freq: Four times a day (QID) | INTRAMUSCULAR | Status: DC | PRN
  Filled 2015-07-21: qty 5

## 2015-07-21 MED ORDER — METHOCARBAMOL 500 MG PO TABS
500.0000 mg | ORAL_TABLET | Freq: Four times a day (QID) | ORAL | Status: DC | PRN
  Administered 2015-07-21 – 2015-07-22 (×2): 500 mg via ORAL
  Filled 2015-07-21: qty 1

## 2015-07-21 MED ORDER — OXYCODONE-ACETAMINOPHEN 5-325 MG PO TABS: 1.0000 | ORAL_TABLET | ORAL | Status: DC | PRN

## 2015-07-21 MED ORDER — MENTHOL 3 MG MT LOZG: 1.0000 | LOZENGE | OROMUCOSAL | Status: DC | PRN

## 2015-07-21 MED ORDER — PHENYLEPHRINE HCL 10 MG/ML IJ SOLN
INTRAMUSCULAR | Status: DC | PRN
  Administered 2015-07-21 (×2): 120 ug via INTRAVENOUS

## 2015-07-21 MED ORDER — SUGAMMADEX SODIUM 200 MG/2ML IV SOLN
INTRAVENOUS | Status: DC | PRN
  Administered 2015-07-21: 150 mg via INTRAVENOUS

## 2015-07-21 MED ORDER — ALUM & MAG HYDROXIDE-SIMETH 200-200-20 MG/5ML PO SUSP: 30.0000 mL | ORAL | Status: DC | PRN

## 2015-07-21 MED ORDER — SODIUM CHLORIDE 0.9 % IV BOLUS (SEPSIS)
500.0000 mL | Freq: Once | INTRAVENOUS | Status: AC
  Administered 2015-07-21: 500 mL via INTRAVENOUS

## 2015-07-21 MED ORDER — MIDAZOLAM HCL 2 MG/2ML IJ SOLN
INTRAMUSCULAR | Status: AC
  Filled 2015-07-21: qty 2

## 2015-07-21 MED ORDER — ENOXAPARIN SODIUM 40 MG/0.4ML ~~LOC~~ SOLN
40.0000 mg | SUBCUTANEOUS | Status: DC
  Administered 2015-07-22 – 2015-07-28 (×7): 40 mg via SUBCUTANEOUS
  Filled 2015-07-21 (×7): qty 0.4

## 2015-07-21 MED ORDER — PROPOFOL 10 MG/ML IV BOLUS
INTRAVENOUS | Status: DC | PRN
  Administered 2015-07-21: 120 mg via INTRAVENOUS

## 2015-07-21 MED ORDER — FENTANYL CITRATE (PF) 100 MCG/2ML IJ SOLN
INTRAMUSCULAR | Status: DC | PRN
  Administered 2015-07-21: 100 ug via INTRAVENOUS
  Administered 2015-07-21: 150 ug via INTRAVENOUS

## 2015-07-21 MED ORDER — LIDOCAINE HCL (CARDIAC) 20 MG/ML IV SOLN
INTRAVENOUS | Status: AC
  Filled 2015-07-21: qty 5

## 2015-07-21 MED ORDER — CEFAZOLIN SODIUM-DEXTROSE 2-3 GM-% IV SOLR
2.0000 g | INTRAVENOUS | Status: AC
  Administered 2015-07-21: 2 g via INTRAVENOUS
  Filled 2015-07-21: qty 50

## 2015-07-21 MED ORDER — ENOXAPARIN SODIUM 40 MG/0.4ML ~~LOC~~ SOLN: 40.0000 mg | Freq: Every day | SUBCUTANEOUS | Status: DC

## 2015-07-21 MED ORDER — OXYCODONE HCL 5 MG PO TABS: 5.0000 mg | ORAL_TABLET | ORAL | Status: DC | PRN

## 2015-07-21 MED ORDER — 0.9 % SODIUM CHLORIDE (POUR BTL) OPTIME
TOPICAL | Status: DC | PRN
  Administered 2015-07-21: 1000 mL

## 2015-07-21 MED ORDER — MIDAZOLAM HCL 5 MG/5ML IJ SOLN
INTRAMUSCULAR | Status: DC | PRN
  Administered 2015-07-21: 2 mg via INTRAVENOUS

## 2015-07-21 MED ORDER — PHENOL 1.4 % MT LIQD
1.0000 | OROMUCOSAL | Status: DC | PRN
Start: 2015-07-21 — End: 2015-07-28

## 2015-07-21 MED ORDER — ACETAMINOPHEN 650 MG RE SUPP: 650.0000 mg | Freq: Four times a day (QID) | RECTAL | Status: DC | PRN

## 2015-07-21 MED ORDER — PHENYLEPHRINE HCL 10 MG/ML IJ SOLN
INTRAMUSCULAR | Status: AC
  Filled 2015-07-21: qty 1

## 2015-07-21 MED ORDER — ACETAMINOPHEN 325 MG PO TABS: 650.0000 mg | ORAL_TABLET | Freq: Four times a day (QID) | ORAL | Status: DC | PRN

## 2015-07-21 MED ORDER — ROCURONIUM BROMIDE 100 MG/10ML IV SOLN
INTRAVENOUS | Status: DC | PRN
  Administered 2015-07-21: 40 mg via INTRAVENOUS

## 2015-07-21 MED ORDER — MORPHINE SULFATE (PF) 2 MG/ML IV SOLN
0.5000 mg | INTRAVENOUS | Status: DC | PRN
  Administered 2015-07-22 – 2015-07-28 (×13): 0.5 mg via INTRAVENOUS
  Filled 2015-07-21 (×13): qty 1

## 2015-07-21 MED ORDER — METOCLOPRAMIDE HCL 5 MG PO TABS: 5.0000 mg | ORAL_TABLET | Freq: Three times a day (TID) | ORAL | Status: DC | PRN

## 2015-07-21 SURGICAL SUPPLY — 46 items
BNDG COHESIVE 4X5 TAN STRL (GAUZE/BANDAGES/DRESSINGS) ×3 IMPLANT
COVER PERINEAL POST (MISCELLANEOUS) ×3 IMPLANT
COVER SURGICAL LIGHT HANDLE (MISCELLANEOUS) ×3 IMPLANT
DRAPE C-ARM 42X72 X-RAY (DRAPES) IMPLANT
DRAPE C-ARMOR (DRAPES) IMPLANT
DRAPE INCISE IOBAN 66X45 STRL (DRAPES) IMPLANT
DRAPE ORTHO SPLIT 77X108 STRL (DRAPES) ×6
DRAPE PROXIMA HALF (DRAPES) IMPLANT
DRAPE STERI IOBAN 125X83 (DRAPES) ×2 IMPLANT
DRAPE SURG ORHT 6 SPLT 77X108 (DRAPES) IMPLANT
DRAPE U-SHAPE 47X51 STRL (DRAPES) IMPLANT
DRSG EMULSION OIL 3X3 NADH (GAUZE/BANDAGES/DRESSINGS) IMPLANT
DRSG MEPILEX BORDER 4X4 (GAUZE/BANDAGES/DRESSINGS) ×6 IMPLANT
DRSG MEPILEX BORDER 4X8 (GAUZE/BANDAGES/DRESSINGS) ×3 IMPLANT
DURAPREP 26ML APPLICATOR (WOUND CARE) ×5 IMPLANT
ELECT CAUTERY BLADE 6.4 (BLADE) ×3 IMPLANT
ELECT REM PT RETURN 9FT ADLT (ELECTROSURGICAL)
ELECTRODE REM PT RTRN 9FT ADLT (ELECTROSURGICAL) ×1 IMPLANT
FACESHIELD WRAPAROUND (MASK) IMPLANT
FACESHIELD WRAPAROUND OR TEAM (MASK) ×2 IMPLANT
GLOVE SKINSENSE NS SZ7.5 (GLOVE) ×4
GLOVE SKINSENSE STRL SZ7.5 (GLOVE) ×2 IMPLANT
GOWN STRL REIN XL XLG (GOWN DISPOSABLE) ×12 IMPLANT
GUIDE PIN 3.2MM (MISCELLANEOUS) ×3
GUIDE PIN ORTH 343X3.2XBRAD (MISCELLANEOUS) IMPLANT
KIT BASIN OR (CUSTOM PROCEDURE TRAY) ×3 IMPLANT
KIT ROOM TURNOVER OR (KITS) ×3 IMPLANT
LINER BOOT UNIVERSAL DISP (MISCELLANEOUS) ×1 IMPLANT
MANIFOLD NEPTUNE II (INSTRUMENTS) ×1 IMPLANT
NAIL RIGHT 10X38MM (Nail) ×2 IMPLANT
NS IRRIG 1000ML POUR BTL (IV SOLUTION) ×3 IMPLANT
PACK GENERAL/GYN (CUSTOM PROCEDURE TRAY) ×3 IMPLANT
PAD ARMBOARD 7.5X6 YLW CONV (MISCELLANEOUS) ×6 IMPLANT
SCREW LAG COMPR KIT 90/85 (Screw) ×2 IMPLANT
STAPLER VISISTAT 35W (STAPLE) IMPLANT
STOCKINETTE IMPERVIOUS 9X36 MD (GAUZE/BANDAGES/DRESSINGS) IMPLANT
SUT ETHILON 2 0 PSLX (SUTURE) ×2 IMPLANT
SUT MON AB 2-0 CT1 36 (SUTURE) ×2 IMPLANT
SUT VIC AB 0 CT1 27 (SUTURE) ×3
SUT VIC AB 0 CT1 27XBRD ANBCTR (SUTURE) ×1 IMPLANT
SUT VIC AB 2-0 CT1 27 (SUTURE) ×3
SUT VIC AB 2-0 CT1 TAPERPNT 27 (SUTURE) ×2 IMPLANT
TUBE CONNECTING 12'X1/4 (SUCTIONS) ×1
TUBE CONNECTING 12X1/4 (SUCTIONS) ×1 IMPLANT
WATER STERILE IRR 1000ML POUR (IV SOLUTION) ×2 IMPLANT
YANKAUER SUCT BULB TIP NO VENT (SUCTIONS) ×2 IMPLANT

## 2015-07-21 NOTE — Progress Notes (Signed)
Jeff Ferrell:[id number] DOB: 1950-09-08 DOA: 07/18/2015 PCP: [name], MD  Brief narrative: 65 y/o ? Diabetes mellitus type 2,  Sinus tachycardia with grade 1 diastolic dysfunction EF 6570% Chronic hyponatremia prior osteomyelitis Hip fracture 06/11/15 with repair 12/30. Patient readmitted 07/02/15, orthopedics consulted and noted superficial infection with scant drainage-transitioned from vancomycin to oral medications and only walk with negative staph grew Patient readmitted on 07/21/15 with right-sided hip fracture at this time  Consultants:  Orthopedics Dr. Deno Etienne  Procedures:  None yet  Antibiotics:  None   Subjective  Patient having significant right and left lower extremity pain Just medicated No nausea no vomiting Nothing by mouth for procedure   Objective    Interim History:   Telemetry: Sinus, sinus tach   Objective: Filed Vitals:   07/20/15 2014 07/21/15 0140 07/21/15 0600 07/21/15 0958  BP: 136/68 125/66 120/67 129/74  Pulse: 105 109 110 94  Temp: 98.4 F (36.9 C) 98.8 F (37.1 C) 99.1 F (37.3 C) 98.7 F (37.1 C)  TempSrc: Oral Oral Oral Oral  Resp: Height:      Weight:      SpO2: 98% 90% 94% 94%    Intake/Output Summary (Last 24 hours) at 07/21/15 1004 Last data filed at 07/21/15 0958  Gross per 24 hour  Intake 2150.42 ml  Output    802 ml  Net 1348.42 ml    Exam:  General: EOMI NCAT Cardiovascular: S1-S2 no murmur rub or gallop Respiratory: Clinically clear no added sound Abdomen: Slight tenderness Skin no lower extremity edema Neuro range of motion diminished  Data Reviewed: Basic Metabolic Panel:  Recent Labs Lab 07/18/15 2339 07/19/15 0835 07/20/15 0509 07/21/15 0825  NA 128* 127* 132* 128*  K 4.8 4.3 4.8 4.0  CL 94* 94* 100* 95*  CO2 GLUCOSE 156* 149* 127* 106*  BUN CREATININE 0.84 0.77 0.64 0.62  CALCIUM 9.4 9.3 9.3 8.9   Liver Function Tests: No results for  input(s): AST, ALT, ALKPHOS, BILITOT, PROT, ALBUMIN in the last 168 hours. No results for input(s): LIPASE, AMYLASE in the last 168 hours. No results for input(s): AMMONIA in the last 168 hours. CBC:  Recent Labs Lab 07/18/15 2339 07/20/15 0509  WBC 7.2 8.5  NEUTROABS 5.7  --   HGB 11.1* 10.6*  HCT 33.6* 32.7*  MCV 84.6 85.2  PLT 369 409*   Cardiac Enzymes: No results for input(s): CKTOTAL, CKMB, CKMBINDEX, TROPONINI in the last 168 hours. BNP: Invalid input(s): POCBNP CBG:  Recent Labs Lab 07/20/15 1657 07/20/15 2127 07/20/15 2346 07/21/15 0350 07/21/15 0816  GLUCAP 118* 114* 137* 107* 92    Recent Results (from the past 240 hour(s))  Surgical pcr screen     Status: Abnormal   Collection Time: 07/19/15  8:55 AM  Result Value Ref Range Status   MRSA, PCR POSITIVE (A) NEGATIVE Final    Comment: RESULT CALLED TO, READ BACK BY AND VERIFIED WITH: AMY FARGO,RN [id number] @ 1417 BY J SCOTTON    Staphylococcus aureus POSITIVE (A) NEGATIVE Final    Comment:        The Xpert SA Assay (FDA approved for NASAL specimens in patients over 22 years of age), is one component of a comprehensive surveillance program.  Test performance has been validated by [facility] for patients greater than or equal to 28 year old. It is not intended to diagnose infection nor to guide or monitor treatment.  RESULT CALLED TO, READ BACK BY AND VERIFIED WITH: AMY FARGO,RN [id number] @ 1417 BY J SCOTTON      Studies:              All Imaging reviewed and is as per above notation   Scheduled Meds: . bisacodyl  10 mg Rectal Once  .  ceFAZolin (ANCEF) IV  2 g Intravenous To SS-Surg  . Chlorhexidine Gluconate Cloth  6 each Topical Q0600  . docusate sodium  100 mg Oral BID  . feeding supplement (ENSURE ENLIVE)  237 mL Oral BID BM  . mupirocin ointment  1 application Nasal BID  . polyethylene glycol  17 g Oral BID  . senna-docusate  1 tablet Oral BID  . sulfamethoxazole-trimethoprim  1 tablet Oral  Q12H  . tamsulosin  0.4 mg Oral Daily   Continuous Infusions: . sodium chloride 75 mL/hr at 07/20/15 2333     Assessment/Plan:  1. Right hip fracture-patient has been seen by orthopedics and is asking what procedure will be done. I will defer to orthopedics plan of care.  per discretion Ortho services-appreciate their input into  Anticoagulation post-op Weight bearing tolerance for therapy services Wound care Pain management Follow-up services required 2. Diabetes mellitus type 2-prior to admission usually on Januvia 50 daily and not on sliding scale. We will start sliding scale every 4 coverage and transitioned to before meals at bedtime coverage without basal insulin 3. Chronic hyponatremia-monitor labs in the a.m. should not preclude surgery 4. Compensated heart failure grade 1 diastolic dysfunction-hold medications for now 5. Moderate malnutrition feeding supplements once able 6. BPH continue Flomax 0.4    Await hip surgery Will need PT OT to follow based on recommendations from orthopedics Anticipate return to skilled nursing   Pleas Koch, MD  [facility] Pager [phone] 07/21/2015, 10:04 AM    LOS: 2 days

## 2015-07-21 NOTE — Care Management Note (Signed)
Case Management Note  Patient Details  Name: Dayshon Roback MRN: [id number] Date of Birth: December 18, 1950  Subjective/Objective:                    Action/Plan:  Consult for home health needs , await post op PT/OT evals  Expected Discharge Date:                  Expected Discharge Plan:     In-House Referral:     Discharge planning Services  CM Consult  Post Acute Care Choice:    Choice offered to:     DME Arranged:    DME Agency:     HH Arranged:    HH Agency:     Status of Service:  In process, will continue to follow  Medicare Important Message Given:    Date Medicare IM Given:    Medicare IM give by:    Date Additional Medicare IM Given:    Additional Medicare Important Message give by:     If discussed at Long Length of Stay Meetings, dates discussed:    Additional Comments:  Kingsley Plan, RN 07/21/2015, 11:09 AM

## 2015-07-21 NOTE — Anesthesia Procedure Notes (Signed)
Procedure Name: Intubation Date/Time: 07/21/2015 3:19 PM Performed by: Jefm Miles E Pre-anesthesia Checklist: Patient identified, Emergency Drugs available, Suction available, Patient being monitored and Timeout performed Patient Re-evaluated:Patient Re-evaluated prior to inductionOxygen Delivery Method: Circle system utilized Preoxygenation: Pre-oxygenation with 100% oxygen Intubation Type: IV induction Ventilation: Mask ventilation without difficulty Laryngoscope Size: Mac and 3 Grade View: Grade II Tube type: Oral Tube size: 7.5 mm Number of attempts: 1 Airway Equipment and Method: Stylet Placement Confirmation: ETT inserted through vocal cords under direct vision,  positive ETCO2 and breath sounds checked- equal and bilateral Secured at: 23 cm Tube secured with: Tape Dental Injury: Teeth and Oropharynx as per pre-operative assessment

## 2015-07-21 NOTE — Discharge Instructions (Signed)
° ° °  1. Change dressings as needed °2. May shower but keep incisions covered and dry °3. Take lovenox to prevent blood clots °4. Take stool softeners as needed °5. Take pain meds as needed ° °

## 2015-07-21 NOTE — Clinical Documentation Improvement (Signed)
Internal Medicine  Can the diagnosis of Malnutrition be further specified?   Document Severity - Severe(third degree), Moderate (second degree), Mild (first degree)  Other condition  Unable to clinically determine  Supporting Information: :  In progress note of 07/20/15 both severe and moderate malnutrition are documented in the same note. Please clarify type.   Please exercise your independent, professional judgment when responding. A specific answer is not anticipated or expected.   Thank Modesta Messing [facility] Health Information Management [place] [phone]

## 2015-07-21 NOTE — Progress Notes (Signed)
Pt returned from paccu s/p im nailing to right hip. C/o 8/10 pain, 2 po percocet administered

## 2015-07-21 NOTE — Anesthesia Postprocedure Evaluation (Signed)
Anesthesia Post Note  Patient: Jeff Ferrell  Procedure(s) Performed: Procedure(s) (LRB): Intramedullary nail femoral (Right)  Patient location during evaluation: PACU Anesthesia Type: General Level of consciousness: awake and alert Pain management: pain level controlled Vital Signs Assessment: post-procedure vital signs reviewed and stable Respiratory status: spontaneous breathing, nonlabored ventilation, respiratory function stable and patient connected to nasal cannula oxygen Cardiovascular status: blood pressure returned to baseline and stable Postop Assessment: no signs of nausea or vomiting Anesthetic complications: no    Last Vitals:  Filed Vitals:   07/21/15 1727 07/21/15 1753  BP: 114/74 108/73  Pulse: 102 111  Temp: 36.7 C 36.6 C  Resp: 15 16    Last Pain:  Filed Vitals:   07/21/15 1754  PainSc: 6                  Eldine Rencher,W. EDMOND

## 2015-07-21 NOTE — Progress Notes (Signed)
2 po percocets administered for c/o 8/10 pain

## 2015-07-21 NOTE — Op Note (Signed)
   Date of Surgery: 07/21/2015  INDICATIONS: Jeff Ferrell is a 65 y.o.-year-old male who sustained a right hip fracture. The risks and benefits of the procedure discussed with the patient prior to the procedure and all questions were answered; consent was obtained.  PREOPERATIVE DIAGNOSIS: right hip fracture   POSTOPERATIVE DIAGNOSIS: Same   PROCEDURE: Treatment of intertrochanteric, pertrochanteric, subtrochanteric fracture with intramedullary implant. CPT [id number]   SURGEON: N. Glee Arvin, M.D.   ANESTHESIA: general   IV FLUIDS AND URINE: See anesthesia record   ESTIMATED BLOOD LOSS: 200 cc  IMPLANTS: [employer] InterTAN 10 x 38, 90/85  DRAINS: None.   COMPLICATIONS: None.   DESCRIPTION OF PROCEDURE: The patient was brought to the operating room and placed supine on the operating table. The patient's leg had been signed prior to the procedure. The patient had the anesthesia placed by the anesthesiologist. The prep verification and incision time-outs were performed to confirm that this was the correct patient, site, side and location. The patient had an SCD on the opposite lower extremity. The patient did receive antibiotics prior to the incision and was re-dosed during the procedure as needed at indicated intervals. The patient was positioned on the fracture table with the table in traction and internal rotation to reduce the hip. The well leg was placed in a scissor position and all bony prominences were well-padded. The patient had the lower extremity prepped and draped in the standard surgical fashion. The incision was made 4 finger breadths superior to the greater trochanter. A guide pin was inserted into the tip of the greater trochanter under fluoroscopic guidance. An opening reamer was used to gain access to the femoral canal. The nail length was measured and inserted down the femoral canal to its proper depth. The appropriate version of insertion for the lag screw was found under  fluoroscopy. A pin was inserted up the femoral neck through the jig. Then, a second antirotation pin was inserted inferior to the first pin. The length of the lag screw was then measured. The lag screw was inserted as near to center-center in the head as possible. The antirotation pin was then taken out and an interdigitating compression screw was placed in its place. The leg was taken out of traction, then the interdigitating compression screw was used to compress across the fracture. Compression was visualized on serial xrays. The wound was copiously irrigated with saline and the subcutaneous layer closed with 2.0 vicryl and the skin was reapproximated with staples. The wounds were cleaned and dried a final time and a sterile dressing was placed. The hip was taken through a range of motion at the end of the case under fluoroscopic imaging to visualize the approach-withdraw phenomenon and confirm implant length in the head. The patient was then awakened from anesthesia and taken to the recovery room in stable condition. All counts were correct at the end of the case.   POSTOPERATIVE PLAN: The patient will be weight bearing as tolerated and will return in 2 weeks for staple removal and the patient will receive DVT prophylaxis based on other medications, activity level, and risk ratio of bleeding to thrombosis.   Mayra Reel, MD [facility] [phone] 4:20 PM

## 2015-07-21 NOTE — H&P (Signed)
H&P update  The surgical history has been reviewed and remains accurate without interval change.  The patient was re-examined and patient's physiologic condition has not changed significantly in the last 30 days. The condition still exists that makes this procedure necessary. The treatment plan remains the same, without new options for care.  No new pharmacological allergies or types of therapy has been initiated that would change the plan or the appropriateness of the plan.  The patient and/or family understand the potential benefits and risks.  Mayra Reel, MD 07/21/2015 7:22 AM

## 2015-07-21 NOTE — Anesthesia Preprocedure Evaluation (Addendum)
Anesthesia Evaluation  Patient identified by MRN, date of birth, ID band Patient awake    Reviewed: Allergy & Precautions, H&P , NPO status , Patient's Chart, lab work & pertinent test results  Airway Mallampati: III  TM Distance: >3 FB Neck ROM: Full    Dental no notable dental hx. (+) Poor Dentition, Dental Advisory Given   Pulmonary neg pulmonary ROS,    Pulmonary exam normal breath sounds clear to auscultation       Cardiovascular negative cardio ROS   Rhythm:Regular Rate:Normal     Neuro/Psych negative neurological ROS  negative psych ROS   GI/Hepatic negative GI ROS, Neg liver ROS,   Endo/Other  diabetes, Type 2, Oral Hypoglycemic Agents  Renal/GU negative Renal ROS  negative genitourinary   Musculoskeletal  (+) Arthritis , Osteoarthritis,    Abdominal   Peds  Hematology negative hematology ROS (+)   Anesthesia Other Findings   Reproductive/Obstetrics negative OB ROS                            Anesthesia Physical Anesthesia Plan  ASA: II  Anesthesia Plan: General   Post-op Pain Management:    Induction: Intravenous  Airway Management Planned: Oral ETT  Additional Equipment:   Intra-op Plan:   Post-operative Plan: Extubation in OR  Informed Consent: I have reviewed the patients History and Physical, chart, labs and discussed the procedure including the risks, benefits and alternatives for the proposed anesthesia with the patient or authorized representative who has indicated his/her understanding and acceptance.   Dental advisory given  Plan Discussed with: CRNA  Anesthesia Plan Comments:         Anesthesia Quick Evaluation

## 2015-07-21 NOTE — Consult Note (Signed)
ORTHOPAEDIC CONSULTATION  REQUESTING PHYSICIAN: Rhetta Mura, MD  Chief Complaint: Right hip fracture  HPI: Jeff Ferrell is a 65 y.o. male who presents with right hip fracture.  He states he did not have any trauma or falls.  The patient endorses severe pain in the right hip, that does not radiate, grinding in quality, worse with any movement, better with immobilization.  Denies LOC/fever/chills/nausea/vomiting.  Walks with assistive devices (walker, cane, wheelchair).  Does not live independently.  Recently had left hip surgery for fracture.  Past Medical History  Diagnosis Date  . Diabetes mellitus     regulated by diet  . Chronic back pain   . 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 Diabetic osteomyelitis (HCC) 03/29/2015  . Diabetic foot ulcer (HCC) 03/29/2015   Past Surgical History  Procedure Laterality Date  . Back surgery      screws & hardware to lower back  . 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 Femur im nail Left 06/18/2015    Procedure: INTRAMEDULLARY (IM) NAIL FEMORAL;  Surgeon: Tarry Kos, MD;  Location: [facility] OR;  Service: Orthopedics;  Laterality: Left;   Social History   Social History  . Marital Status: Single    Spouse Name: N/A  . Number of Children: N/A  . Years of Education: N/A   Social History Main Topics  . Smoking status: Never Smoker   . Smokeless tobacco: None  . Alcohol Use: 3.6 oz/week    6 Cans of beer per week     Comment: daily  . Drug Use: No  . Sexual Activity: Not Asked   Other Topics Concern  . None   Social History Narrative   History reviewed. No pertinent family history. Allergies  Allergen Reactions  . Penicillins Hives and Rash    Has patient had a PCN reaction causing immediate rash, facial/tongue/throat swelling, SOB or lightheadedness with hypotension: Yes Has patient had a  PCN reaction causing severe rash involving mucus membranes or skin necrosis: Yes   Has patient had a PCN reaction that required hospitalization No Has patient had a PCN reaction occurring within the last 10 years: No If all of the above answers are "NO", then may proceed with Cephalosporin use.    Prior to Admission medications   Medication Sig Start Date End Date Taking? Authorizing Provider  acetaminophen (TYLENOL) 325 MG tablet Take 2 tablets (650 mg total) by mouth every 6 (six) hours as needed for mild pain (or Fever >/= 101). 07/06/15  Yes Osvaldo Shipper, MD  bisacodyl (DULCOLAX) 10 MG suppository Place 10 mg rectally once.   Yes Historical Provider, MD  docusate sodium (COLACE) 100 MG capsule Take 1 capsule (100 mg total) by mouth 2 (two) times daily. 07/06/15  Yes Osvaldo Shipper, MD  enoxaparin (LOVENOX) 40 MG/0.4ML injection Inject 0.4 mLs (40 mg total) into the skin daily. For 3 weeks 07/06/15  Yes Osvaldo Shipper, MD  magnesium hydroxide (MILK OF MAGNESIA) 400 MG/5ML suspension Take 30 mLs by mouth daily as needed for mild constipation.   Yes Historical Provider, MD  methocarbamol (ROBAXIN) 500 MG tablet Take 1 tablet (500 mg total) by mouth 3 (three) times daily. 07/06/15  Yes Osvaldo Shipper, MD  oxyCODONE (OXY IR/ROXICODONE) 5 MG immediate release tablet Take 1-3 tablets (5-15 mg total) by mouth every 4 (four) hours as needed. Patient taking differently: Take  5-15 mg by mouth every 4 (four) hours as needed (mild to moderate to severe pain.). 5 mg=mild pain 10 mg=moderate pain 15=severe pain. 07/06/15  Yes Osvaldo Shipper, MD  oxyCODONE-acetaminophen (PERCOCET) 7.5-325 MG tablet Take 1 tablet by mouth 3 (three) times daily. 07/06/15  Yes Osvaldo Shipper, MD  polyethylene glycol (MIRALAX / GLYCOLAX) packet Take 17 g by mouth 2 (two) times daily. 07/06/15  Yes Osvaldo Shipper, MD  sennosides-docusate sodium (SENOKOT-S) 8.6-50 MG tablet Take 1 tablet by mouth 2 (two) times daily.   Yes Historical  Provider, MD  sitaGLIPtin (JANUVIA) 50 MG tablet Take 1 tablet (50 mg total) by mouth daily. 06/23/15  Yes Catarina Hartshorn, MD  sodium phosphate (FLEET) enema Place 1 enema rectally once. follow package directions   Yes Historical Provider, MD  sulfamethoxazole-trimethoprim (BACTRIM DS,SEPTRA DS) 800-160 MG tablet Take 1 tablet by mouth every 12 (twelve) hours. For 10 days Patient taking differently: Take 1 tablet by mouth every 12 (twelve) hours. For 14 days therapy course began 07/12/2015. 07/06/15  Yes Osvaldo Shipper, MD  tamsulosin (FLOMAX) 0.4 MG CAPS capsule Take 1 capsule (0.4 mg total) by mouth daily. 06/21/15  Yes Catarina Hartshorn, MD   Ct Hip Right Wo Contrast  07/20/2015  CLINICAL DATA:  Right hip fracture. EXAM: CT OF THE RIGHT HIP WITHOUT CONTRAST TECHNIQUE: Multidetector CT imaging of the right hip was performed according to the standard protocol. Multiplanar CT image reconstructions were also generated. COMPARISON:  None. FINDINGS: Impacted, displaced and angulated right basicervical right femoral neck fracture. Comminuted fracture also involving the right greater trochanter. No extension of the fracture cleft to the lesser trochanter. The visualized right superior and inferior pubic rami are intact. There is no aggressive lytic or sclerotic osseous lesion. 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 The muscles are normal. There is no fluid collection or hematoma. There is peripheral vascular atherosclerotic disease. IMPRESSION: 1. Impacted, displaced and angulated right basicervical right femoral neck fracture. Comminuted fracture also involving the right greater trochanter. No extension of the fracture cleft to the lesser trochanter. Electronically Signed   By: Elige Ko   On: 07/20/2015 09:33    All pertinent xrays, MRI, CT independently reviewed and interpreted  Positive ROS: All other systems have been reviewed and were otherwise negative with the exception of those mentioned in the HPI and as  above.  Physical Exam: General: Alert, no acute distress Cardiovascular: No pedal edema Respiratory: No cyanosis, no use of accessory musculature GI: No organomegaly, abdomen is soft and non-tender Skin: No lesions in the area of chief complaint Neurologic: Sensation intact distally Psychiatric: Patient is competent for consent with normal mood and affect Lymphatic: No axillary or cervical lymphadenopathy  MUSCULOSKELETAL:  - severe pain with movement of the hip and extremity - skin intact - NVI distally - compartments soft  Assessment: Right hip fracture  Plan: - surgery is recommended, patient and family are aware of r/b/a and wish to proceed - consent obtained - medical optimization per primary team - surgery is planned for this afternoon  Thank you for the consult and the opportunity to see Mr. Rotz  N. Glee Arvin, MD [facility] [phone] 8:59 AM

## 2015-07-21 NOTE — Transfer of Care (Signed)
Immediate Anesthesia Transfer of Care Note  Patient: Jeff Ferrell  Procedure(s) Performed: Procedure(s): Intramedullary nail femoral (Right)  Patient Location: PACU  Anesthesia Type:General  Level of Consciousness: awake and alert   Airway & Oxygen Therapy: Patient Spontanous Breathing and Patient connected to nasal cannula oxygen  Post-op Assessment: Report given to RN and Patient moving all extremities X 4  Post vital signs: Reviewed and stable  Last Vitals:  Filed Vitals:   07/21/15 0600 07/21/15 0958  BP: 120/67 129/74  Pulse: 110 94  Temp: 37.3 C 37.1 C  Resp: 18 16    Complications: No apparent anesthesia complications

## 2015-07-21 NOTE — Progress Notes (Signed)
Pre procedure antibiotic sent to OR with pt

## 2015-07-22 ENCOUNTER — Encounter (HOSPITAL_COMMUNITY): Payer: Self-pay | Admitting: Orthopaedic Surgery

## 2015-07-22 LAB — BASIC METABOLIC PANEL
Anion gap: 10 (ref 5–15)
BUN: 7 mg/dL (ref 6–20)
CHLORIDE: 95 mmol/L — AB (ref 101–111)
CO2: 23 mmol/L (ref 22–32)
CREATININE: 0.71 mg/dL (ref 0.61–1.24)
Calcium: 8.4 mg/dL — ABNORMAL LOW (ref 8.9–10.3)
GFR calc Af Amer: >60 mL/min (ref >60)
GFR calc non Af Amer: >60 mL/min (ref >60)
GLUCOSE: 160 mg/dL — AB (ref 65–99)
POTASSIUM: 4.2 mmol/L (ref 3.5–5.1)
SODIUM: 128 mmol/L — AB (ref 135–145)

## 2015-07-22 LAB — CBC WITH DIFFERENTIAL/PLATELET
Basophils Absolute: 0 10*3/uL (ref 0.0–0.1)
Basophils Relative: 0 %
Eosinophils Absolute: 0.1 10*3/uL (ref 0.0–0.7)
Eosinophils Relative: 2 %
HEMATOCRIT: 26.5 % — AB (ref 39.0–52.0)
HEMOGLOBIN: 9.1 g/dL — AB (ref 13.0–17.0)
LYMPHS ABS: 1.1 10*3/uL (ref 0.7–4.0)
LYMPHS PCT: 14 %
MCH: 28.3 pg (ref 26.0–34.0)
MCHC: 34.3 g/dL (ref 30.0–36.0)
MCV: 82.6 fL (ref 78.0–100.0)
MONOS PCT: 14 %
Monocytes Absolute: 1.1 10*3/uL — ABNORMAL HIGH (ref 0.1–1.0)
NEUTROS PCT: 70 %
Neutro Abs: 5.6 10*3/uL (ref 1.7–7.7)
Platelets: 355 10*3/uL (ref 150–400)
RBC: 3.21 MIL/uL — AB (ref 4.22–5.81)
RDW: 13.2 % (ref 11.5–15.5)
WBC: 8 10*3/uL (ref 4.0–10.5)

## 2015-07-22 LAB — GLUCOSE, CAPILLARY
GLUCOSE-CAPILLARY: 125 mg/dL — AB (ref 65–99)
GLUCOSE-CAPILLARY: 151 mg/dL — AB (ref 65–99)
Glucose-Capillary: 134 mg/dL — ABNORMAL HIGH (ref 65–99)
Glucose-Capillary: 199 mg/dL — ABNORMAL HIGH (ref 65–99)
Glucose-Capillary: 142 mg/dL — ABNORMAL HIGH (ref 65–99)
Glucose-Capillary: 130 mg/dL — ABNORMAL HIGH (ref 65–99)

## 2015-07-22 MED ORDER — METHOCARBAMOL 500 MG PO TABS
500.0000 mg | ORAL_TABLET | Freq: Four times a day (QID) | ORAL | Status: DC
  Administered 2015-07-22 – 2015-07-28 (×25): 500 mg via ORAL
  Filled 2015-07-22 (×24): qty 1

## 2015-07-22 MED ORDER — MUPIROCIN 2 % EX OINT
TOPICAL_OINTMENT | Freq: Two times a day (BID) | CUTANEOUS | Status: DC
  Administered 2015-07-22: 1 via NASAL
  Administered 2015-07-23 – 2015-07-28 (×11): via NASAL
  Filled 2015-07-22: qty 22

## 2015-07-22 MED ORDER — INSULIN ASPART 100 UNIT/ML ~~LOC~~ SOLN
3.0000 [IU] | Freq: Three times a day (TID) | SUBCUTANEOUS | Status: DC
  Administered 2015-07-23 – 2015-07-28 (×13): 3 [IU] via SUBCUTANEOUS

## 2015-07-22 MED ORDER — INSULIN ASPART 100 UNIT/ML ~~LOC~~ SOLN
0.0000 [IU] | Freq: Three times a day (TID) | SUBCUTANEOUS | Status: DC
Start: 2015-07-22 — End: 2015-07-28
  Administered 2015-07-22 – 2015-07-24 (×4): 1 [IU] via SUBCUTANEOUS
  Administered 2015-07-24 – 2015-07-25 (×2): 2 [IU] via SUBCUTANEOUS
  Administered 2015-07-25 (×2): 1 [IU] via SUBCUTANEOUS
  Administered 2015-07-26: 2 [IU] via SUBCUTANEOUS
  Administered 2015-07-26 – 2015-07-27 (×2): 1 [IU] via SUBCUTANEOUS
  Administered 2015-07-27: 3 [IU] via SUBCUTANEOUS

## 2015-07-22 MED ORDER — HYDROCODONE-ACETAMINOPHEN 5-325 MG PO TABS
1.0000 | ORAL_TABLET | ORAL | Status: DC
  Administered 2015-07-22 (×2): 2 via ORAL
  Administered 2015-07-22: 1 via ORAL
  Administered 2015-07-22 – 2015-07-27 (×25): 2 via ORAL
  Administered 2015-07-27: 1 via ORAL
  Administered 2015-07-27 – 2015-07-28 (×5): 2 via ORAL
  Administered 2015-07-28: 1 via ORAL
  Administered 2015-07-28 (×3): 2 via ORAL
  Filled 2015-07-22 (×37): qty 2

## 2015-07-22 MED ORDER — ENSURE ENLIVE PO LIQD
237.0000 mL | Freq: Three times a day (TID) | ORAL | Status: DC
  Administered 2015-07-23 – 2015-07-25 (×4): 237 mL via ORAL

## 2015-07-22 NOTE — Progress Notes (Signed)
   Subjective:  Patient reports pain as mild.    Objective:   VITALS:   Filed Vitals:   07/22/15 1341 07/22/15 1500 07/22/15 1700 07/22/15 1934  BP: 100/62   100/64  Pulse: 120 115 105 106  Temp: 98.4 F (36.9 C)   98.7 F (37.1 C)  TempSrc: Tympanic   Oral  Resp: 17   19  Height:      Weight:      SpO2: 98%   98%    Right hip dressings c/d/i Left thigh wound is improving, no worsening, no drainage   Lab Results  Component Value Date   WBC 8.0 07/22/2015   HGB 9.1* 07/22/2015   HCT 26.5* 07/22/2015   MCV 82.6 07/22/2015   PLT 355 07/22/2015     Assessment/Plan:  1 Day Post-Op   - Expected postop acute blood loss anemia - will monitor for symptoms - Up with PT/OT - DVT ppx - SCDs, ambulation, lovenox - WBAT operative extremity - Pain control - continue bactroban ointment BID to left thigh wound and bactrim DS BID  Cheral Almas 07/22/2015, 7:45 PM [phone]

## 2015-07-22 NOTE — Progress Notes (Signed)
Nutrition Follow-up  DOCUMENTATION CODES:   Severe malnutrition in context of acute illness/injury  INTERVENTION:   -Increase Ensure Enlive po to TID, each supplement provides 350 kcal and 20 grams of protein  NUTRITION DIAGNOSIS:   Inadequate oral intake related to poor appetite as evidenced by per patient/family report.  Ongoing  GOAL:   Patient will meet greater than or equal to 90% of their needs  Progressing  MONITOR:   Diet advancement, Labs, Weight trends, Skin, I & O's  REASON FOR ASSESSMENT:   Consult Hip fracture protocol  ASSESSMENT:   65 year old African-American male who was recently hospitalized for a left fracture and then rehospitalized for wound infection and uncontrolled pain and muscle spasms. He had refused placement to SNF after his first admission. After he was hospitalized again, he agreed to go to SNF and was discharged in stable condition. His wound infection had improved with the antibiotics. He was doing well at the skilled nursing facility getting rehabilitation. He presented with acute worsening in right hip pain over the last 2-3 days and has been found to have a fracture involving his right hip now.   Pt transferred from [facility] to [facility] on 07/20/15 for surgery.   S/p PROCEDURE 07/21/15:  Treatment of intertrochanteric, pertrochanteric, subtrochanteric fracture with intramedullary implant. CPT [phone]   Attempted to speak with pt x 2, however, in with MD and working with therapy at times of visits.   Pt has been advanced to a regular diet. Meal completion has been poor since surgery (PO: 25%). He is consuming his Ensure supplements.   Labs reviewed: Na: 128, CBGS: 139-199.  Diet Order:  Diet regular Room service appropriate?: Yes; Fluid consistency:: Thin  Skin:  Wound (see comment) (closed lt and rt hip incisions)  Last BM:  07/22/15  Height:   Ht Readings from Last 1 Encounters:  07/19/15  (1.753 m)    Weight:   Wt Readings from  Last 1 Encounters:  07/19/15 159 lb 2.8 oz (72.2 kg)    Ideal Body Weight:  72.7 kg  BMI:  Body mass index is 23.49 kg/(m^2).  Estimated Nutritional Needs:   Kcal:  2100-2300  Protein:  100-110g  Fluid:  2.1L/day  EDUCATION NEEDS:   Education needs no appropriate at this time  Gift Rueckert A. Mayford Knife, RD, LDN, CDE Pager: [phone] After hours Pager: [phone]

## 2015-07-22 NOTE — Progress Notes (Signed)
Orthopedic Tech Progress Note Patient Details:  Jeff Ferrell [birth year] [id number]  Patient ID: [name], male   DOB: 1950/07/25, 65 y.o.   MRN: [id number] ohf applied  Trinna Post 07/22/2015, 6:27 AM

## 2015-07-22 NOTE — Progress Notes (Signed)
Jeff Ferrell:[id number] DOB: 1951-05-21 DOA: 07/18/2015 PCP: [name], MD  Brief narrative: 65 y/o ? Diabetes mellitus type 2,  Sinus tachycardia with grade 1 diastolic dysfunction EF 6570% Chronic hyponatremia prior osteomyelitis Hip fracture 06/11/15 with repair 12/30. Patient readmitted 07/02/15, orthopedics consulted and noted superficial infection with scant drainage-transitioned from vancomycin to oral medications and only walk with negative staph grew Patient readmitted on 07/21/15 with right-sided hip fracture at this time  Consultants:  Orthopedics Dr. Deno Etienne  Procedures:  None yet  Antibiotics:  None   Subjective   States 9/10 pain No fever nor chills Doesn't use oxygen at home but sats drop off of it Eating fairly only   Objective    Interim History:   Telemetry: Sinus, sinus tach   Objective: Filed Vitals:   07/21/15 2123 07/22/15 0125 07/22/15 0536 07/22/15 1056  BP: 109/63 104/60 116/66   Pulse:  108 117 120  Temp:  98.3 F (36.8 C) 98.4 F (36.9 C)   TempSrc:  Oral Oral   Resp:  17 17   Height:      Weight:      SpO2:  96% 97% 94%    Intake/Output Summary (Last 24 hours) at 07/22/15 1214 Last data filed at 07/22/15 0950  Gross per 24 hour  Intake 2372.5 ml  Output   1625 ml  Net  747.5 ml    Exam:  General: EOMI NCAT Cardiovascular: S1-S2 no murmur rub or gallop Respiratory: Clinically clear no added sound Abdomen: Slight tenderness Skin no lower extremity edema Neuro range of motion diminished  Data Reviewed: Basic Metabolic Panel:  Recent Labs Lab 07/18/15 2339 07/19/15 0835 07/20/15 0509 07/21/15 0825 07/21/15 1827 07/22/15 0713  NA 128* 127* 132* 128*  --  128*  K 4.8 4.3 4.8 4.0  --  4.2  CL 94* 94* 100* 95*  --  95*  CO2 --  23  GLUCOSE 156* 149* 127* 106*  --  160*  BUN --  7  CREATININE 0.84 0.77 0.64 0.62 0.69 0.71  CALCIUM 9.4 9.3 9.3 8.9  --  8.4*   Liver Function  Tests: No results for input(s): AST, ALT, ALKPHOS, BILITOT, PROT, ALBUMIN in the last 168 hours. No results for input(s): LIPASE, AMYLASE in the last 168 hours. No results for input(s): AMMONIA in the last 168 hours. CBC:  Recent Labs Lab 07/18/15 2339 07/20/15 0509 07/21/15 1827 07/22/15 0713  WBC 7.2 8.5 7.5 8.0  NEUTROABS 5.7  --   --  5.6  HGB 11.1* 10.6* 11.1* 9.1*  HCT 33.6* 32.7* 32.6* 26.5*  MCV 84.6 85.2 82.3 82.6  PLT 369 409* 337 355   Cardiac Enzymes: No results for input(s): CKTOTAL, CKMB, CKMBINDEX, TROPONINI in the last 168 hours. BNP: Invalid input(s): POCBNP CBG:  Recent Labs Lab 07/21/15 2005 07/21/15 2356 07/22/15 0339 07/22/15 0712 07/22/15 1119  GLUCAP 167* 151* 134* 199* 125*    Recent Results (from the past 240 hour(s))  Surgical pcr screen     Status: Abnormal   Collection Time: 07/19/15  8:55 AM  Result Value Ref Range Status   MRSA, PCR POSITIVE (A) NEGATIVE Final    Comment: RESULT CALLED TO, READ BACK BY AND VERIFIED WITH: AMY FARGO,RN [id number] @ 1417 BY J SCOTTON    Staphylococcus aureus POSITIVE (A) NEGATIVE Final    Comment:        The Xpert SA Assay (FDA approved for NASAL  specimens in patients over 66 years of age), is one component of a comprehensive surveillance program.  Test performance has been validated by [facility] for patients greater than or equal to 66 year old. It is not intended to diagnose infection nor to guide or monitor treatment. RESULT CALLED TO, READ BACK BY AND VERIFIED WITH: AMY FARGO,RN [id number] @ 1417 BY J SCOTTON      Studies:              All Imaging reviewed and is as per above notation   Scheduled Meds: .  ceFAZolin (ANCEF) IV  2 g Intravenous Q6H  . Chlorhexidine Gluconate Cloth  6 each Topical Q0600  . enoxaparin (LOVENOX) injection  40 mg Subcutaneous Q24H  . feeding supplement (ENSURE ENLIVE)  237 mL Oral TID BM  . HYDROcodone-acetaminophen  1-2 tablet Oral 6 times per day  . insulin  aspart  0-9 Units Subcutaneous 6 times per day  . methocarbamol  500 mg Oral QID  . mupirocin ointment  1 application Nasal BID  . senna-docusate  1 tablet Oral BID  . sulfamethoxazole-trimethoprim  1 tablet Oral Q12H  . tamsulosin  0.4 mg Oral Daily   Continuous Infusions:     Assessment/Plan:  1. Right hip fracture-patient has been seen by orthopedics and is asking what procedure will be done. I will defer to orthopedics plan of care.  per discretion Ortho services-appreciate their input into  Anticoagulation post-op Weight bearing tolerance for therapy services Wound care Pain management--I have increased his medications to scheduled Vicodin and scheduled Methocarbamol Follow-up services required 2. Acute blood loss anemia-expected.  Repeat cbc in am 3. Diabetes mellitus type 2-prior to admission usually on Januvia 50 daily and not on sliding scale. cbg's 125-199. Changed to QID ACHS coverage 4. Chronic hyponatremia-monitor labs in the a.m. should not preclude surgery 5. Compensated heart failure grade 1 diastolic dysfunction-hold medications for now 6. Moderate malnutrition feeding supplements once able 7. BPH continue Flomax 0.4  Will need PT OT to follow based on recommendations from orthopedics Anticipate return to skilled nursing   Pleas Koch, MD  [facility] Pager [phone] 07/22/2015, 12:14 PM    LOS: 3 days

## 2015-07-22 NOTE — Evaluation (Signed)
Occupational Therapy Evaluation Patient Details Name: Jeff Ferrell MRN: [id number] DOB: 12-Oct-1950 Today's Date: 07/22/2015    History of Present Illness 65 yo male adm with bil hip pain, L hip incision dehiscence 07/01/15; PMHx:  L great toe amp,  multiple rib fxs, L hip fx --s/p Im nail  06/17/16, back surgery and chronic pain, pt suffers from severe LE and back muscle spasms per his report, pt was at [facility] following L hip fracture on 06/18/15 and noted right hip pain after transfer from w/c to bed at [facility], there was no fall or mode of injury. Pt was with resultant R femoral neck fracture and is WBAT per chart review.   Clinical Impression   Pt admitted as above and is limited significantly for ADL's secondary to bilateral LE sustained muscle spasms and inability to stand secondary to inability to extend knees for weight bearing. Pt should benefit from acute OT to assist in maximizing independence with ADL's and functional transfers related to ADL's  prior to anticipated d/c back to SNF. Consider sliding board transfers to drop arm commode next visit.    Follow Up Recommendations  SNF;Supervision/Assistance - 24 hour    Equipment Recommendations  Other (comment) (Defer to next venue)    Recommendations for Other Services       Precautions / Restrictions Precautions Precautions: Fall Precaution Comments: bil LE extreme spasms with bil knee flexion maintained throughout Restrictions Weight Bearing Restrictions: Yes RLE Weight Bearing: Weight bearing as tolerated LLE Weight Bearing: Weight bearing as tolerated      Mobility Bed Mobility Overal bed mobility: Needs Assistance;+2 for physical assistance Bed Mobility: Rolling;Sidelying to Sit Rolling: Min assist Sidelying to sit: Max assist;+2 for physical assistance Supine to sit: +2 for physical assistance;Mod assist Sit to supine: +2 for physical assistance;Mod assist   General bed mobility comments: cues for sequence  and use of rail with pad assist to roll to right side. Total assist to move bil LEs off EOB and assist to elevate trunk  Transfers Overall transfer level: Needs assistance   Transfers: Squat Pivot Transfers     Squat pivot transfers: Total assist;+2 physical assistance     General transfer comment: pt with cues for grasping therapist arms and anterior translation. Total assist with use of belt and pad, bil knees blocked to pivot bed to chair. Pt maintains hip and knee flexion throughout. Sliding board may be more appropriate to advance mobility    Balance Overall balance assessment: Needs assistance Sitting-balance support: Bilateral upper extremity supported Sitting balance-Leahy Scale: Fair       Standing balance-Leahy Scale: Zero                              ADL Overall ADL's : Needs assistance/impaired     Grooming: Wash/dry hands;Set up;Sitting   Upper Body Bathing: Set up;Sitting   Lower Body Bathing: Sitting/lateral leans;Bed level;+2 for physical assistance;Cueing for safety;Cueing for sequencing;Moderate assistance   Upper Body Dressing : Sitting;Set up;Min guard   Lower Body Dressing: +2 for physical assistance;Bed level;Sitting/lateral leans;Cueing for safety;Cueing for sequencing;Moderate assistance     Toilet Transfer Details (indicate cue type and reason): Pt is currently hoyer lift for transfer to 3:1 secondary to bilateral sustained muscle spasms. Pt is unable to static stand due to pain from spasms. Pt is Max A +2 for SPT may benefit from sliding board transfer to drop arm commode. Toileting- Clothing Manipulation and Hygiene: Total  assistance;+2 for physical assistance;+2 for safety/equipment;Bed level       Functional mobility during ADLs: Maximal assistance;+2 for physical assistance;+2 for safety/equipment;Cueing for safety;Cueing for sequencing General ADL Comments: Pt was educated in role of OT and recommendations. He participated in bed  mobility and transfer from bed-chair during this session given +2 assist. Pt is limited significantly for ADL's secondary to bilateral LE sustained muscle spasms and inability to stand secondary to keeping knees flexed. Pt should benefit from acute OT to assist in maximizing independence with ADL's and functional transfers related to ADL's  prior to anticipated d/c back to SNF. Consider sliding board transfers to drop arm commode next visit.     Vision Vision Assessment?: No apparent visual deficits   Perception     Praxis      Pertinent Vitals/Pain Pain Assessment: 0-10 Faces Pain Scale: Hurts even more Pain Location: Bil LE w/ spasms Pain Descriptors / Indicators: Guarding;Spasm Pain Intervention(s): Limited activity within patient's tolerance;Monitored during session;Repositioned;Premedicated before session     Hand Dominance Right   Extremity/Trunk Assessment Upper Extremity Assessment Upper Extremity Assessment: Generalized weakness   Lower Extremity Assessment Lower Extremity Assessment: Defer to PT evaluation RLE Deficits / Details: Pt with bil LE limited by constant and maintained spasm of quad, hamstring and gastroc. Pt even with AAROM limited to grossly 60-70 degrees knee flexion, unable to achieve further extension or flexion. Pt with bil LE no sensation bil feet, decreased proprioception entire leg and decreased light touch RLE Sensation: decreased proprioception;decreased light touch RLE Coordination: decreased gross motor LLE Deficits / Details: Pt with bil LE limited by constant and maintained spasm of quad, hamstring and gastroc. Pt even with AAROM limited to grossly 60-70 degrees knee flexion, unable to achieve further extension or flexion. Pt with bil LE no sensation bil feet, decreased proprioception entire leg and decreased light touch LLE Sensation: decreased light touch;decreased proprioception LLE Coordination: decreased gross motor       Communication  Communication Communication: No difficulties   Cognition Arousal/Alertness: Awake/alert Behavior During Therapy: WFL for tasks assessed/performed Overall Cognitive Status: Within Functional Limits for tasks assessed                     General Comments       Exercises       Shoulder Instructions      Home Living Family/patient expects to be discharged to:: Skilled nursing facility Living Arrangements: Alone   Type of Home: Apartment Home Access: Level entry     Home Layout: One level                          Prior Functioning/Environment Level of Independence: Needs assistance  Gait / Transfers Assistance Needed: pt states he was walking with RW and therapy at SNF but became limited when spasms started about 2 weeks ago per his report          OT Diagnosis: Generalized weakness;Acute pain;Other (comment) (Severe bilateral LE muscle spasms impacting his ability to perform functional transfers)   OT Problem List: Decreased strength;Decreased activity tolerance;Impaired balance (sitting and/or standing);Decreased knowledge of precautions;Decreased knowledge of use of DME or AE;Pain;Other (comment) (Severe bilateral LE muscle spasms)   OT Treatment/Interventions: Self-care/ADL training;Therapeutic exercise;DME and/or AE instruction;Patient/family education;Therapeutic activities;Balance training    OT Goals(Current goals can be found in the care plan section) Acute Rehab OT Goals Patient Stated Goal: be able to get rid of the spasms and walk  Time For Goal Achievement: 08/05/15 Potential to Achieve Goals: Fair  OT Frequency: Min 2X/week   Barriers to D/C:            Co-evaluation PT/OT/SLP Co-Evaluation/Treatment: Yes Reason for Co-Treatment: Complexity of the patient's impairments (multi-system involvement);For patient/therapist safety PT goals addressed during session: Mobility/safety with mobility;Balance;Strengthening/ROM OT goals addressed  during session: ADL's and self-care      End of Session Equipment Utilized During Treatment: Gait belt  Activity Tolerance: Patient limited by pain;Patient tolerated treatment well Patient left: in chair;with call bell/phone within reach;with chair alarm set   Time: 1026-1050 OT Time Calculation (min): 24 min Charges:  OT General Charges $OT Visit: 1 Procedure OT Evaluation $OT Eval High Complexity: 1 Procedure G-Codes:    Barnhill, Amy Beth Dixon, OTR/L 07/22/2015, 11:18 AM

## 2015-07-22 NOTE — Evaluation (Signed)
Physical Therapy Evaluation Patient Details Name: Jeff Ferrell MRN: [id number] DOB: [birth year] Today's Date: 07/22/2015   History of Present Illness  65 yo male adm with right hip idopathic fx with severe bil LE muscle spasms Pt with recent L hip fx 06/17/16 s/p IM nail PMHx:  L great toe amp,  multiple rib fxs, back surgery and chronic pain  Clinical Impression  Pt very pleasant and wanting to be able to move and progress function. However, pt with intense bil LE spasms maintaining bil hip and knee flexion even with attempts at overpressure and AAROM. Pt very limited with all activity secondary to spasms and unable to perform any attempts at standing. Pt may benefit from focusing more on sliding board and seated transfers until spasms can be controlled allowing ROM bil LE. Pt with decreased ROM, function, strength and activity tolerance who will benefit from acute therapy to increase mobility, balance and function.     Follow Up Recommendations SNF;Supervision/Assistance - 24 hour    Equipment Recommendations  Wheelchair (measurements PT);Wheelchair cushion (measurements PT)    Recommendations for Other Services       Precautions / Restrictions Precautions Precautions: Fall Precaution Comments: bil LE extreme spasms with bil knee flexion maintained throughout Restrictions Weight Bearing Restrictions: Yes RLE Weight Bearing: Weight bearing as tolerated LLE Weight Bearing: Weight bearing as tolerated      Mobility  Bed Mobility Overal bed mobility: Needs Assistance;+2 for physical assistance Bed Mobility: Rolling;Sidelying to Sit Rolling: Min assist Sidelying to sit: Max assist;+2 for physical assistance       General bed mobility comments: cues for sequence and use of rail with pad assist to roll to right side. Total assist to move bil LEs off EOB and assist to elevate trunk  Transfers Overall transfer level: Needs assistance   Transfers: Squat Pivot Transfers     Squat  pivot transfers: Total assist;+2 physical assistance     General transfer comment: pt with cues for grasping therapist arms and anterior translation. Total assist with use of belt and pad, bil knees blocked to pivot bed to chair. Pt maintains hip and knee flexion throughout. Sliding board may be more appropriate to advance mobility  Ambulation/Gait Ambulation/Gait assistance:  (pt unable)              Stairs            Wheelchair Mobility    Modified Rankin (Stroke Patients Only)       Balance Overall balance assessment: Needs assistance   Sitting balance-Leahy Scale: Fair       Standing balance-Leahy Scale: Zero                               Pertinent Vitals/Pain Pain Assessment: 0-10 Faces Pain Scale: Hurts even more Pain Location: bil LE with spasms Pain Descriptors / Indicators: Guarding;Spasm Pain Intervention(s): Limited activity within patient's tolerance;Premedicated before session;Repositioned;Monitored during session    Home Living Family/patient expects to be discharged to:: Skilled nursing facility Living Arrangements: Alone   Type of Home: Apartment Home Access: Level entry     Home Layout: One level        Prior Function Level of Independence: Needs assistance   Gait / Transfers Assistance Needed: pt states he was walking with RW and therapy at SNF but became limited when spasms started about 2 weeks ago per his report           Hand  Dominance   Dominant Hand: Right    Extremity/Trunk Assessment   Upper Extremity Assessment: Defer to OT evaluation           Lower Extremity Assessment: Generalized weakness RLE Deficits / Details: Pt with bil LE limited by constant and maintained spasm of quad, hamstring and gastroc. Pt even with AAROM limited to grossly 60-70 degrees knee flexion, unable to achieve further extension or flexion. Pt with bil LE no sensation bil feet, decreased proprioception entire leg and  decreased light touch LLE Deficits / Details: Pt with bil LE limited by constant and maintained spasm of quad, hamstring and gastroc. Pt even with AAROM limited to grossly 60-70 degrees knee flexion, unable to achieve further extension or flexion. Pt with bil LE no sensation bil feet, decreased proprioception entire leg and decreased light touch     Communication   Communication: No difficulties  Cognition Arousal/Alertness: Awake/alert Behavior During Therapy: WFL for tasks assessed/performed Overall Cognitive Status: Within Functional Limits for tasks assessed                      General Comments      Exercises        Assessment/Plan    PT Assessment Patient needs continued PT services  PT Diagnosis Difficulty walking;Generalized weakness;Acute pain   PT Problem List Decreased strength;Decreased range of motion;Decreased activity tolerance;Decreased balance;Decreased knowledge of use of DME;Decreased mobility;Pain;Decreased coordination;Impaired sensation  PT Treatment Interventions Functional mobility training;Therapeutic activities;Therapeutic exercise;Balance training;Patient/family education;DME instruction   PT Goals (Current goals can be found in the Care Plan section) Acute Rehab PT Goals Patient Stated Goal: be able to get rid of the spasms and walk PT Goal Formulation: With patient Time For Goal Achievement: 08/05/15 Potential to Achieve Goals: Fair    Frequency Min 2X/week   Barriers to discharge Decreased caregiver support      Co-evaluation PT/OT/SLP Co-Evaluation/Treatment: Yes Reason for Co-Treatment: Complexity of the patient's impairments (multi-system involvement);For patient/therapist safety PT goals addressed during session: Mobility/safety with mobility;Balance;Strengthening/ROM         End of Session Equipment Utilized During Treatment: Gait belt Activity Tolerance: Patient limited by pain Patient left: in chair;with call bell/phone  within reach;with chair alarm set Nurse Communication: Mobility status;Precautions;Need for lift equipment         Time: 1027-1050 PT Time Calculation (min) (ACUTE ONLY): 23 min   Charges:   PT Evaluation $PT Eval Moderate Complexity: 1 Procedure     PT G CodesDelorse Lek 07/22/2015, 11:04 AM Delaney Meigs, PT [phone]

## 2015-07-22 NOTE — NC FL2 (Signed)
[place] MEDICAID FL2 LEVEL OF CARE SCREENING TOOL     IDENTIFICATION  Patient Name: Jeff Ferrell Birthdate: 26-Apr-1951 Sex: male Admission Date (Current Location): 07/18/2015  [facility] and [place] Number:  Producer, television/film/video and Address:  [facility], [address] [place], [place] [address]      Provider Number: [id number]  Attending Physician Name and Address:  Rhetta Mura, MD  Relative Name and Phone Number:       Current Level of Care: Hospital Recommended Level of Care: Nursing Facility Prior Approval Number:    Date Approved/Denied:   PASRR Number:  [id number])  Discharge Plan: SNF    Current Diagnoses: Patient Active Problem List   Diagnosis Date Noted  . Fracture of femoral neck, right, closed 07/19/2015  . Femoral neck fracture, right, closed, initial encounter 07/19/2015  . Protein-calorie malnutrition, severe 07/19/2015  . Leg weakness, bilateral 07/02/2015  . Left leg swelling 07/02/2015  . Left-sided chest wall pain   . Acute blood loss anemia 06/21/2015  . Hyponatremia 06/20/2015  . Femur fracture, left (HCC) 06/17/2015  . Discitis of lumbar region   . Left hip pain   . Hypoglycemia 06/16/2015  . UTI (lower urinary tract infection) 06/11/2015  . Back pain 06/11/2015  . Malnutrition of moderate degree 03/30/2015  . Diabetes mellitus (HCC) 03/29/2015    Orientation RESPIRATION BLADDER Height & Weight     Self, Time, Situation, Place  Normal Continent Weight: 159 lb 2.8 oz (72.2 kg) Height:   (175.3 cm)  BEHAVIORAL SYMPTOMS/MOOD NEUROLOGICAL BOWEL NUTRITION STATUS      Continent  (carb modified)  AMBULATORY STATUS COMMUNICATION OF NEEDS Skin   Extensive Assist Verbally Surgical wounds                       Personal Care Assistance Level of Assistance  Bathing, Dressing Bathing Assistance: Maximum assistance   Dressing Assistance: Maximum assistance     Functional Limitations Info             SPECIAL CARE FACTORS FREQUENCY  PT (By licensed PT), OT (By licensed OT)     PT Frequency:  (5x/week) OT Frequency:  (5x/week)            Contractures Contractures Info: Not present    Additional Factors Info  Code Status Code Status Info:  (full) Allergies Info:  (penicillins)   Insulin Sliding Scale Info:  (3x daily with meals)       Current Medications (07/22/2015):  This is the current hospital active medication list Current Facility-Administered Medications  Medication Dose Route Frequency Provider Last Rate Last Dose  . acetaminophen (TYLENOL) tablet 650 mg  650 mg Oral Q6H PRN Naiping Donnelly Stager, MD       Or  . acetaminophen (TYLENOL) suppository 650 mg  650 mg Rectal Q6H PRN Tarry Kos, MD      . acetaminophen (TYLENOL) tablet 650 mg  650 mg Oral Q6H PRN Hillary Bow, DO      . alum & mag hydroxide-simeth (MAALOX/MYLANTA) 200-200-20 MG/5ML suspension 30 mL  30 mL Oral Q4H PRN Naiping Donnelly Stager, MD      . Chlorhexidine Gluconate Cloth 2 % PADS 6 each  6 each Topical Q0600 Osvaldo Shipper, MD   6 each at 07/22/15 0630  . enoxaparin (LOVENOX) injection 40 mg  40 mg Subcutaneous Q24H Naiping Donnelly Stager, MD   40 mg at 07/22/15 0851  . feeding supplement (  ENSURE ENLIVE) (ENSURE ENLIVE) liquid 237 mL  237 mL Oral TID BM Rhetta Mura, MD   237 mL at 07/22/15 1400  . HYDROcodone-acetaminophen (NORCO/VICODIN) 5-325 MG per tablet 1-2 tablet  1-2 tablet Oral 6 times per day Rhetta Mura, MD   1 tablet at 07/22/15 2015  . HYDROmorphone (DILAUDID) injection 0.5 mg  0.5 mg Intravenous Q2H PRN Hillary Bow, DO   0.5 mg at 07/22/15 1641  . insulin aspart (novoLOG) injection 0-9 Units  0-9 Units Subcutaneous TID WC Rhetta Mura, MD   1 Units at 07/22/15 1733  . insulin aspart (novoLOG) injection 3 Units  3 Units Subcutaneous TID WC Rhetta Mura, MD   3 Units at 07/22/15 1800  . magnesium hydroxide (MILK OF MAGNESIA) suspension 30 mL  30 mL Oral Daily PRN Hillary Bow, DO      . menthol-cetylpyridinium (CEPACOL) lozenge 3 mg  1 lozenge Oral PRN Naiping Donnelly Stager, MD       Or  . phenol (CHLORASEPTIC) mouth spray 1 spray  1 spray Mouth/Throat PRN Naiping Donnelly Stager, MD      . methocarbamol (ROBAXIN) tablet 500 mg  500 mg Oral QID Rhetta Mura, MD   500 mg at 07/22/15 2128  . morphine 2 MG/ML injection 0.5 mg  0.5 mg Intravenous Q2H PRN Tarry Kos, MD   0.5 mg at 07/22/15 1032  . mupirocin ointment (BACTROBAN) 2 % 1 application  1 application Nasal BID Osvaldo Shipper, MD   1 application at 07/22/15 2129  . mupirocin ointment (BACTROBAN) 2 %   Nasal BID Tarry Kos, MD   1 application at 07/22/15 2132  . ondansetron (ZOFRAN) tablet 4 mg  4 mg Oral Q6H PRN Naiping Donnelly Stager, MD       Or  . ondansetron [facility]) injection 4 mg  4 mg Intravenous Q6H PRN Tarry Kos, MD      . senna-docusate (Senokot-S) tablet 1 tablet  1 tablet Oral BID Hillary Bow, DO   1 tablet at 07/21/15 1059  . sulfamethoxazole-trimethoprim (BACTRIM DS,SEPTRA DS) 800-160 MG per tablet 1 tablet  1 tablet Oral Q12H Hillary Bow, DO   1 tablet at 07/22/15 2128  . tamsulosin (FLOMAX) capsule 0.4 mg  0.4 mg Oral Daily Hillary Bow, DO   0.4 mg at 07/22/15 1024     Discharge Medications: Please see discharge summary for a list of discharge medications.  Relevant Imaging Results:  Relevant Lab Results:   Additional Information  ([id number])  Victoriano Campion, Randol Kern, LCSW

## 2015-07-22 NOTE — Clinical Social Work Note (Signed)
Clinical Social Work Assessment  Patient Details  Name: Jeff Ferrell MRN: [id number] Date of Birth: [birth year]  Date of referral:  07/22/15               Reason for consult:  Facility Placement                Permission sought to share information with:  Family Supports Permission granted to share information::  No  Housing/Transportation Living arrangements for the past 2 months:  [place], [place] of Information:  Patient Patient Interpreter Needed:  None Criminal Activity/Legal Involvement Pertinent to Current Situation/Hospitalization:  No - Comment as needed Significant Relationships:  None Lives with:  Self, Facility Resident Do you feel safe going back to the place where you live?  Yes Need for family participation in patient care:  No (Coment)  Care giving concerns:  No family/friends available at bedside to express additional concerns.   Social Worker assessment / plan:  Holiday representative met with patient at bedside to offer support and discuss patient needs at discharge.  Patient states that he was a resident at [facility] and would like to return at discharge.  CSW contacted facility who states that patient has not paid for a bed hold, however there are no other additional barriers for patient return.  Patient to update family of plans.  CSW remains available for support and to facilitate patient discharge needs once medically stable.  Employment status:  Retired Forensic scientist:  Information systems manager, [employer] Benefit PT Recommendations:  Faith / Referral to community resources:  [place]  Patient/Family's Response to care:  Patient agreeable with return to [facility].  Verbalized understanding of CSW role and appreciation for support.  Patient/Family's Understanding of and Emotional Response to Diagnosis, Current Treatment, and Prognosis:  Patient aware of his continued medical needs, however  verbalizes frustrations regarding continued rehab needs.  Patient does not provide an emotional response at this time.  Emotional Assessment Appearance:  Appears stated age Attitude/Demeanor/Rapport:  Inconsistent, Guarded Affect (typically observed):  Appropriate, Guarded, Frustrated Orientation:  Oriented to Self, Oriented to Place, Oriented to  Time, Oriented to Situation Alcohol / Substance use:  Not Applicable Psych involvement (Current and /or in the community):  No (Comment)  Discharge Needs  Concerns to be addressed:  No discharge needs identified Readmission within the last 30 days:  Yes Current discharge risk:  Physical Impairment Barriers to Discharge:  Continued Medical Work up  [employer], [place]

## 2015-07-23 LAB — GLUCOSE, CAPILLARY
GLUCOSE-CAPILLARY: 84 mg/dL (ref 65–99)
Glucose-Capillary: 131 mg/dL — ABNORMAL HIGH (ref 65–99)
Glucose-Capillary: 142 mg/dL — ABNORMAL HIGH (ref 65–99)
Glucose-Capillary: 114 mg/dL — ABNORMAL HIGH (ref 65–99)

## 2015-07-23 LAB — CBC
HEMATOCRIT: 25.9 % — AB (ref 39.0–52.0)
HEMOGLOBIN: 8.7 g/dL — AB (ref 13.0–17.0)
MCH: 27.4 pg (ref 26.0–34.0)
MCHC: 33.6 g/dL (ref 30.0–36.0)
MCV: 81.7 fL (ref 78.0–100.0)
Platelets: 350 10*3/uL (ref 150–400)
RBC: 3.17 MIL/uL — AB (ref 4.22–5.81)
RDW: 13.1 % (ref 11.5–15.5)
WBC: 8.2 10*3/uL (ref 4.0–10.5)

## 2015-07-23 LAB — BASIC METABOLIC PANEL
Anion gap: 7 (ref 5–15)
CHLORIDE: 94 mmol/L — AB (ref 101–111)
CO2: 25 mmol/L (ref 22–32)
CREATININE: 0.49 mg/dL — AB (ref 0.61–1.24)
Calcium: 8.7 mg/dL — ABNORMAL LOW (ref 8.9–10.3)
GFR calc Af Amer: >60 mL/min (ref >60)
GFR calc non Af Amer: >60 mL/min (ref >60)
Glucose, Bld: 116 mg/dL — ABNORMAL HIGH (ref 65–99)
POTASSIUM: 3.8 mmol/L (ref 3.5–5.1)
SODIUM: 126 mmol/L — AB (ref 135–145)

## 2015-07-23 LAB — SODIUM, URINE, RANDOM: SODIUM UR: 129 mmol/L

## 2015-07-23 LAB — OSMOLALITY, URINE: Osmolality, Ur: 516 mOsm/kg (ref 300–900)

## 2015-07-23 MED ORDER — FUROSEMIDE 10 MG/ML IJ SOLN
40.0000 mg | Freq: Once | INTRAMUSCULAR | Status: AC
  Administered 2015-07-23: 40 mg via INTRAVENOUS
  Filled 2015-07-23: qty 4

## 2015-07-23 NOTE — Care Management Note (Signed)
Case Management Note  Patient Details  Name: Jeff Ferrell MRN: [id number] Date of Birth: [birth year]  Subjective/Objective:                    Action/Plan:  UR updated  Expected Discharge Date:                  Expected Discharge Plan:  Skilled Nursing Facility  In-House Referral:  Clinical Social Work  Discharge planning Services     Post Acute Care Choice:    Choice offered to:     DME Arranged:    DME Agency:     HH Arranged:    HH Agency:     Status of Service:  Completed, signed off  Medicare Important Message Given:    Date Medicare IM Given:    Medicare IM give by:    Date Additional Medicare IM Given:    Additional Medicare Important Message give by:     If discussed at Long Length of Stay Meetings, dates discussed:    Additional Comments:  Kingsley Plan, RN 07/23/2015, 11:39 AM

## 2015-07-23 NOTE — Progress Notes (Signed)
CSW received paperwork back from patient. Forms faxed over to Admissions Director Janie. CSW attempted to contact Janie to inform of recent faxed however received no answer. CSW left voice message at 3:41pm regarding Saturday readmission paperwork.  CSW will continue to follow and provide support to patient while in hospital.   Fernande Boyden, [facility] Clinical Social Worker [facility] Ph: [phone]

## 2015-07-23 NOTE — Progress Notes (Signed)
CSW provided update to facility representative Janie at [facility] regarding patient's anticipated discharge on tomorrow. Per Wille Celeste, patient will need to complete readmission paperwork in order to be admitted on the weekends. Paperwork to be faxed back to [phone].   CSW provided patient with readmission paperwork. Patient to complete for CSW to fax back. No further services requested at this time. CSW will continue to follow and provide support while in the hospital.   Fernande Boyden, [facility] Clinical Social Worker [facility] Ph: [phone]

## 2015-07-23 NOTE — Progress Notes (Signed)
Jeff Ferrell ZOX:[id number] DOB: 1951-03-20 DOA: 07/18/2015 PCP: [name], MD  Brief narrative: 65 y/o ? Diabetes mellitus type 2,  Sinus tachycardia with grade 1 diastolic dysfunction EF 6570% Chronic hyponatremia prior osteomyelitis Hip fracture 06/11/15 with repair 12/30. Patient readmitted 07/02/15, orthopedics consulted and noted superficial infection with scant drainage-transitioned from vancomycin to oral medications and only walk with negative staph grew Patient readmitted on 07/21/15 with right-sided hip fracture at this time  Consultants:  Orthopedics Dr. Deno Etienne  Procedures:  None yet  Antibiotics:  None   Subjective   Pain slightly better controlled tol some diet No cp, spasms have decreased with increase in pain meds No other issue   Objective    Interim History:   Telemetry: Sinus, sinus tach   Objective: Filed Vitals:   07/22/15 1500 07/22/15 1700 07/22/15 1934 07/23/15 0432  BP:   100/64 123/64  Pulse: 115 105 106 102  Temp:   98.7 F (37.1 C) 98.5 F (36.9 C)  TempSrc:   Oral Oral  Resp:   19 19  Height:      Weight:      SpO2:   98% 98%    Intake/Output Summary (Last 24 hours) at 07/23/15 1043 Last data filed at 07/23/15 0954  Gross per 24 hour  Intake    290 ml  Output   1375 ml  Net  -1085 ml    Exam:  General: EOMI NCAT Cardiovascular: S1-S2 no murmur rub or gallop Respiratory: Clinically clear no added sound Abdomen: Slight tenderness Skin no lower extremity edema Neuro range of motion diminished  Data Reviewed: Basic Metabolic Panel:  Recent Labs Lab 07/19/15 0835 07/20/15 0509 07/21/15 0825 07/21/15 1827 07/22/15 0713 07/23/15 0855  NA 127* 132* 128*  --  128* 126*  K 4.3 4.8 4.0  --  4.2 3.8  CL 94* 100* 95*  --  95* 94*  CO2 --  23 25  GLUCOSE 149* 127* 106*  --  160* 116*  BUN --  7 <5*  CREATININE 0.77 0.64 0.62 0.69 0.71 0.49*  CALCIUM 9.3 9.3 8.9  --  8.4* 8.7*   Liver  Function Tests: No results for input(s): AST, ALT, ALKPHOS, BILITOT, PROT, ALBUMIN in the last 168 hours. No results for input(s): LIPASE, AMYLASE in the last 168 hours. No results for input(s): AMMONIA in the last 168 hours. CBC:  Recent Labs Lab 07/18/15 2339 07/20/15 0509 07/21/15 1827 07/22/15 0713 07/23/15 0855  WBC 7.2 8.5 7.5 8.0 8.2  NEUTROABS 5.7  --   --  5.6  --   HGB 11.1* 10.6* 11.1* 9.1* 8.7*  HCT 33.6* 32.7* 32.6* 26.5* 25.9*  MCV 84.6 85.2 82.3 82.6 81.7  PLT 369 409* 337 355 350   Cardiac Enzymes: No results for input(s): CKTOTAL, CKMB, CKMBINDEX, TROPONINI in the last 168 hours. BNP: Invalid input(s): POCBNP CBG:  Recent Labs Lab 07/22/15 0712 07/22/15 1119 07/22/15 1715 07/22/15 1931 07/23/15 0732  GLUCAP 199* 125* 142* 130* 131*    Recent Results (from the past 240 hour(s))  Surgical pcr screen     Status: Abnormal   Collection Time: 07/19/15  8:55 AM  Result Value Ref Range Status   MRSA, PCR POSITIVE (A) NEGATIVE Final    Comment: RESULT CALLED TO, READ BACK BY AND VERIFIED WITH: AMY FARGO,RN [id number] @ 1417 BY J SCOTTON    Staphylococcus aureus POSITIVE (A) NEGATIVE Final    Comment:  The Xpert SA Assay (FDA approved for NASAL specimens in patients over 52 years of age), is one component of a comprehensive surveillance program.  Test performance has been validated by [facility] for patients greater than or equal to 43 year old. It is not intended to diagnose infection nor to guide or monitor treatment. RESULT CALLED TO, READ BACK BY AND VERIFIED WITH: AMY FARGO,RN [id number] @ 1417 BY J SCOTTON      Studies:              All Imaging reviewed and is as per above notation   Scheduled Meds: . Chlorhexidine Gluconate Cloth  6 each Topical Q0600  . enoxaparin (LOVENOX) injection  40 mg Subcutaneous Q24H  . feeding supplement (ENSURE ENLIVE)  237 mL Oral TID BM  . furosemide  40 mg Intravenous Once  . HYDROcodone-acetaminophen   1-2 tablet Oral 6 times per day  . insulin aspart  0-9 Units Subcutaneous TID WC  . insulin aspart  3 Units Subcutaneous TID WC  . methocarbamol  500 mg Oral QID  . mupirocin ointment  1 application Nasal BID  . mupirocin ointment   Nasal BID  . senna-docusate  1 tablet Oral BID  . sulfamethoxazole-trimethoprim  1 tablet Oral Q12H  . tamsulosin  0.4 mg Oral Daily   Continuous Infusions:     Assessment/Plan:  1. Right hip fracture-patient has been seen by orthopedics and is asking what procedure will be done. I will defer to orthopedics plan of care.  per discretion Ortho services-appreciate their input into  Anticoagulation post-op-lovenox Weight bearing  As tolerated per Ortho Pain management--I have increased his medications to scheduled Vicodin and scheduled Methocarbamol Follow-up services required 2. Acute blood loss anemia-expected. Hemoglobin 9-8.7 stop checking 3. Diabetes mellitus type 2-prior to admission usually on Januvia 50 daily and not on sliding scale. cbg's 125-167. Changed to QID ACHS coverage 4. Chronic hyponatremia-send Usom and urine sodium.  Volume restrict, lasix x 1 dose 40 now.  Recheck in am 5. Compensated heart failure grade 1 diastolic dysfunction-hold medications for now 6. Acute urinary retention-probably from narcotics-add flomax.  Clamp foley and unclamp if sensation to void 7. Moderate malnutrition feeding supplements once able 8. BPH continue Flomax 0.4  Will need PT OT to follow based on recommendations from orthopedics Anticipate return to skilled nursing 24-48 hr   Pleas Koch, MD  [facility] Pager [phone] 07/23/2015, 10:43 AM    LOS: 4 days

## 2015-07-23 NOTE — Progress Notes (Signed)
   Subjective:  Patient reports pain as mild.    Objective:   VITALS:   Filed Vitals:   07/22/15 1500 07/22/15 1700 07/22/15 1934 07/23/15 0432  BP:   100/64 123/64  Pulse: 115 105 106 102  Temp:   98.7 F (37.1 C) 98.5 F (36.9 C)  TempSrc:   Oral Oral  Resp:   19 19  Height:      Weight:      SpO2:   98% 98%    Right hip dressings c/d/i Left thigh wound is improving, no worsening, no drainage   Lab Results  Component Value Date   WBC 8.2 07/23/2015   HGB 8.7* 07/23/2015   HCT 25.9* 07/23/2015   MCV 81.7 07/23/2015   PLT 350 07/23/2015     Assessment/Plan:  2 Days Post-Op   - Expected postop acute blood loss anemia - will monitor for symptoms - Up with PT/OT - DVT ppx - SCDs, ambulation, lovenox - WBAT operative extremity - Pain control - continue bactroban ointment BID to left thigh wound and bactrim DS BID - f/u 2 weeks  Jeff Ferrell 07/23/2015, 10:30 AM [phone]

## 2015-07-24 LAB — GLUCOSE, CAPILLARY
GLUCOSE-CAPILLARY: 67 mg/dL (ref 65–99)
GLUCOSE-CAPILLARY: 138 mg/dL — AB (ref 65–99)
Glucose-Capillary: 122 mg/dL — ABNORMAL HIGH (ref 65–99)
Glucose-Capillary: 77 mg/dL (ref 65–99)
Glucose-Capillary: 189 mg/dL — ABNORMAL HIGH (ref 65–99)

## 2015-07-24 LAB — BASIC METABOLIC PANEL
ANION GAP: 13 (ref 5–15)
Anion gap: 11 (ref 5–15)
BUN: <5 mg/dL — ABNORMAL LOW (ref 6–20)
BUN: 7 mg/dL (ref 6–20)
CHLORIDE: 89 mmol/L — AB (ref 101–111)
CO2: 21 mmol/L — AB (ref 22–32)
CO2: 25 mmol/L (ref 22–32)
CREATININE: 0.82 mg/dL (ref 0.61–1.24)
Calcium: 8.9 mg/dL (ref 8.9–10.3)
Calcium: 8.6 mg/dL — ABNORMAL LOW (ref 8.9–10.3)
Chloride: 89 mmol/L — ABNORMAL LOW (ref 101–111)
Creatinine, Ser: 0.55 mg/dL — ABNORMAL LOW (ref 0.61–1.24)
GFR calc non Af Amer: >60 mL/min (ref >60)
GFR calc non Af Amer: >60 mL/min (ref >60)
Glucose, Bld: 111 mg/dL — ABNORMAL HIGH (ref 65–99)
Glucose, Bld: 165 mg/dL — ABNORMAL HIGH (ref 65–99)
POTASSIUM: 4.5 mmol/L (ref 3.5–5.1)
Potassium: 4.2 mmol/L (ref 3.5–5.1)
SODIUM: 125 mmol/L — AB (ref 135–145)
Sodium: 123 mmol/L — ABNORMAL LOW (ref 135–145)

## 2015-07-24 LAB — CBC
HEMATOCRIT: 25 % — AB (ref 39.0–52.0)
Hemoglobin: 8.6 g/dL — ABNORMAL LOW (ref 13.0–17.0)
MCH: 28 pg (ref 26.0–34.0)
MCHC: 34.4 g/dL (ref 30.0–36.0)
MCV: 81.4 fL (ref 78.0–100.0)
PLATELETS: 375 10*3/uL (ref 150–400)
RBC: 3.07 MIL/uL — ABNORMAL LOW (ref 4.22–5.81)
RDW: 13.3 % (ref 11.5–15.5)
WBC: 7.8 10*3/uL (ref 4.0–10.5)

## 2015-07-24 MED ORDER — FUROSEMIDE 40 MG PO TABS
40.0000 mg | ORAL_TABLET | Freq: Two times a day (BID) | ORAL | Status: DC
  Administered 2015-07-24 – 2015-07-25 (×3): 40 mg via ORAL
  Filled 2015-07-24 (×3): qty 1

## 2015-07-24 NOTE — Progress Notes (Signed)
Jeff Ferrell ZOX:[id number] DOB: 1951/01/01 DOA: 07/18/2015 PCP: [name], MD  Brief narrative: 65 y/o ? Diabetes mellitus type 2,  Sinus tachycardia with grade 1 diastolic dysfunction EF 65-70% Chronic hyponatremia prior osteomyelitis Hip fracture 06/11/15 with repair 12/30. Patient readmitted 07/02/15, orthopedics consulted and noted superficial infection with scant drainage-transitioned from vancomycin to oral medications and only walk with negative staph grew Patient readmitted on 07/21/15 with right-sided hip fracture at this time  Consultants:  Orthopedics Dr. Deno Etienne  Procedures:  None yet  Antibiotics:  None   Subjective   Better but still concerned about pain at night. States wasnl;t given prn breakthrough pain meds when needed earlier   Objective    Interim History:   Telemetry: Sinus, sinus tach   Objective: Filed Vitals:   07/23/15 0432 07/23/15 2211 07/24/15 0630 07/24/15 0926  BP: 123/64 104/55 98/63 99/58   Pulse: 102 107 103 106  Temp: 98.5 F (36.9 C) 99.1 F (37.3 C) 99.1 F (37.3 C) 98.3 F (36.8 C)  TempSrc: Oral Oral Oral Oral  Resp: Height:      Weight:      SpO2: 98% 98% 98% 99%    Intake/Output Summary (Last 24 hours) at 07/24/15 0951 Last data filed at 07/23/15 1827  Gross per 24 hour  Intake    600 ml  Output   2400 ml  Net  -1800 ml    Exam:  General: EOMI NCAT Cardiovascular: S1-S2 no murmur rub or gallop Respiratory: Clinically clear no added sound Abdomen: Slight tenderness Skin no lower extremity edema Neuro range of motion diminished  Data Reviewed: Basic Metabolic Panel:  Recent Labs Lab 07/20/15 0509 07/21/15 0825 07/21/15 1827 07/22/15 0713 07/23/15 0855 07/24/15 0620  NA 132* 128*  --  128* 126* 123*  K 4.8 4.0  --  4.2 3.8 4.5  CL 100* 95*  --  95* 94* 89*  CO2 23 22  --  23 25 21*  GLUCOSE 127* 106*  --  160* 116* 111*  BUN 11 6  --  7 <5* <5*  CREATININE 0.64 0.62 0.69 0.71  0.49* 0.55*  CALCIUM 9.3 8.9  --  8.4* 8.7* 8.9   Liver Function Tests: No results for input(s): AST, ALT, ALKPHOS, BILITOT, PROT, ALBUMIN in the last 168 hours. No results for input(s): LIPASE, AMYLASE in the last 168 hours. No results for input(s): AMMONIA in the last 168 hours. CBC:  Recent Labs Lab 07/18/15 2339 07/20/15 0509 07/21/15 1827 07/22/15 0713 07/23/15 0855 07/24/15 0620  WBC 7.2 8.5 7.5 8.0 8.2 7.8  NEUTROABS 5.7  --   --  5.6  --   --   HGB 11.1* 10.6* 11.1* 9.1* 8.7* 8.6*  HCT 33.6* 32.7* 32.6* 26.5* 25.9* 25.0*  MCV 84.6 85.2 82.3 82.6 81.7 81.4  PLT 369 409* 337 355 350 375   Cardiac Enzymes: No results for input(s): CKTOTAL, CKMB, CKMBINDEX, TROPONINI in the last 168 hours. BNP: Invalid input(s): POCBNP CBG:  Recent Labs Lab 07/23/15 0732 07/23/15 1153 07/23/15 1724 07/23/15 2210 07/24/15 0812  GLUCAP 131* 84 142* 114* 122*    Recent Results (from the past 240 hour(s))  Surgical pcr screen     Status: Abnormal   Collection Time: 07/19/15  8:55 AM  Result Value Ref Range Status   MRSA, PCR POSITIVE (A) NEGATIVE Final    Comment: RESULT CALLED TO, READ BACK BY AND VERIFIED WITH: AMY FARGO,RN [id number] @ 1417 BY J SCOTTON  Staphylococcus aureus POSITIVE (A) NEGATIVE Final    Comment:        The Xpert SA Assay (FDA approved for NASAL specimens in patients over 29 years of age), is one component of a comprehensive surveillance program.  Test performance has been validated by [facility] for patients greater than or equal to 1 year old. It is not intended to diagnose infection nor to guide or monitor treatment. RESULT CALLED TO, READ BACK BY AND VERIFIED WITH: AMY FARGO,RN [id number] @ 1417 BY J SCOTTON      Studies:              All Imaging reviewed and is as per above notation   Scheduled Meds: . Chlorhexidine Gluconate Cloth  6 each Topical Q0600  . enoxaparin (LOVENOX) injection  40 mg Subcutaneous Q24H  . feeding supplement  (ENSURE ENLIVE)  237 mL Oral TID BM  . HYDROcodone-acetaminophen  1-2 tablet Oral 6 times per day  . insulin aspart  0-9 Units Subcutaneous TID WC  . insulin aspart  3 Units Subcutaneous TID WC  . methocarbamol  500 mg Oral QID  . mupirocin ointment  1 application Nasal BID  . mupirocin ointment   Nasal BID  . senna-docusate  1 tablet Oral BID  . sulfamethoxazole-trimethoprim  1 tablet Oral Q12H  . tamsulosin  0.4 mg Oral Daily   Continuous Infusions:     Assessment/Plan:  1. Right hip fracture-patient has been seen by orthopedics and is asking what procedure will be done. I will defer to orthopedics plan of care.  per discretion Ortho services-appreciate their input into  Anticoagulation post-op-lovenox Weight bearing  As tolerated per Ortho Pain management--I have increased his medications to scheduled Vicodin and scheduled Methocarbamol Follow-up services required 2. Acute blood loss anemia-expected. Hemoglobin 9-8.7 stop checking 3. Diabetes mellitus type 2-prior to admission usually on Januvia 50 daily and not on sliding scale. cbg's 125-167. Changed to QID ACHS coverage 4. Acute  On Chronic euvolemic hyponatremia- 2/2 to ABLA + polydipsiaMeasured Serum OSM 254,  Uosm 516  and urine sodium 129.  Volume restrict, lasix x 1 dose 40 now--  D/w Dr. Allena Katz of Nep[facility].  Rec's daily lasix 40 and fluid restrict 1200 cc. D/c Bactrim.  Note that  I asked nursing to ensure he restrcits fluid intake 5. Compensated heart failure grade 1 diastolic dysfunction-hold medications for now 6. Acute urinary retention-probably from narcotics-add flomax.  Clamp foley and unclamp if sensation to void 7. Moderate malnutrition feeding supplements to be given -this is moderate malnutirtion 8. BPH continue Flomax 0.4  Will need PT OT to follow based on recommendations from orthopedics Anticipate return to skilled nursing in am if sodium improves   Pleas Koch, MD  [facility] Pager  [phone] 07/24/2015, 9:51 AM    LOS: 5 days

## 2015-07-24 NOTE — Progress Notes (Signed)
CSW made aware by MD that the patient has low sodium and will not be discharging on today. CSW contacted facility to inform. Patient may return tomorrow once medically stable, per [place].   Fernande Boyden, LCSWA Clinical Social Worker [facility] Ph: [phone]

## 2015-07-24 NOTE — Progress Notes (Signed)
CSW followed up with Janie from [facility]. Per Wille Celeste, patient can return to the facility on today. D/C Summary to be sent via hub system. CSW will arrange transportation once patient is medically stable and ready for discharge.   CSW will continue to follow and provide support while in hospital.   Fernande Boyden, [facility] Clinical Social Worker  [facility] Ph: [phone]

## 2015-07-24 NOTE — Progress Notes (Signed)
Subjective: 3 Days Post-Op Procedure(s) (LRB): Intramedullary nail femoral (Right) Patient reports pain as moderate to severe.    Objective: Vital signs in last 24 hours: Temp:  [99.1 F (37.3 C)] 99.1 F (37.3 C) (02/03 2211) Pulse Rate:  [107] 107 (02/03 2211) Resp:  [19] 19 (02/03 2211) BP: (104)/(55) 104/55 mmHg (02/03 2211) SpO2:  [98 %] 98 % (02/03 2211)  Intake/Output from previous day: 02/03 0701 - 02/04 0700 In: 600 [P.O.:600] Out: 2400 [Urine:2400] Intake/Output this shift:     Recent Labs  07/21/15 1827 07/22/15 0713 07/23/15 0855 07/24/15 0620  HGB 11.1* 9.1* 8.7* 8.6*    Recent Labs  07/23/15 0855 07/24/15 0620  WBC 8.2 7.8  RBC 3.17* 3.07*  HCT 25.9* 25.0*  PLT 350 375    Recent Labs  07/23/15 0855 07/24/15 0620  NA 126* 123*  K 3.8 4.5  CL 94* 89*  CO2 25 21*  BUN <5* <5*  CREATININE 0.49* 0.55*  GLUCOSE 116* 111*  CALCIUM 8.7* 8.9   No results for input(s): LABPT, INR in the last 72 hours.  Sensation intact distally Incision: dressing C/D/I Compartment soft  Assessment/Plan: 3 Days Post-Op Procedure(s) (LRB): Intramedullary nail femoral (Right) Up with therapy  HgB is stabilizing OOB with PT/OT WBAT right leg Jeff Ferrell 07/24/2015, 9:26 AM

## 2015-07-25 LAB — IRON AND TIBC
Iron: 18 ug/dL — ABNORMAL LOW (ref 45–182)
Saturation Ratios: 10 % — ABNORMAL LOW (ref 17.9–39.5)
TIBC: 183 ug/dL — ABNORMAL LOW (ref 250–450)
UIBC: 165 ug/dL

## 2015-07-25 LAB — BASIC METABOLIC PANEL
ANION GAP: 12 (ref 5–15)
ANION GAP: 11 (ref 5–15)
BUN: 7 mg/dL (ref 6–20)
BUN: 6 mg/dL (ref 6–20)
CALCIUM: 8.3 mg/dL — AB (ref 8.9–10.3)
CHLORIDE: 84 mmol/L — AB (ref 101–111)
CO2: 23 mmol/L (ref 22–32)
CO2: 25 mmol/L (ref 22–32)
CREATININE: 0.56 mg/dL — AB (ref 0.61–1.24)
Calcium: 8.4 mg/dL — ABNORMAL LOW (ref 8.9–10.3)
Chloride: 88 mmol/L — ABNORMAL LOW (ref 101–111)
Creatinine, Ser: 0.55 mg/dL — ABNORMAL LOW (ref 0.61–1.24)
GFR calc Af Amer: >60 mL/min (ref >60)
GFR calc non Af Amer: >60 mL/min (ref >60)
GFR calc non Af Amer: >60 mL/min (ref >60)
GLUCOSE: 155 mg/dL — AB (ref 65–99)
Glucose, Bld: 144 mg/dL — ABNORMAL HIGH (ref 65–99)
POTASSIUM: 3.6 mmol/L (ref 3.5–5.1)
Potassium: 4.1 mmol/L (ref 3.5–5.1)
Sodium: 123 mmol/L — ABNORMAL LOW (ref 135–145)
Sodium: 120 mmol/L — ABNORMAL LOW (ref 135–145)

## 2015-07-25 LAB — FERRITIN: FERRITIN: 250 ng/mL (ref 24–336)

## 2015-07-25 LAB — GLUCOSE, CAPILLARY
GLUCOSE-CAPILLARY: 124 mg/dL — AB (ref 65–99)
Glucose-Capillary: 144 mg/dL — ABNORMAL HIGH (ref 65–99)
Glucose-Capillary: 165 mg/dL — ABNORMAL HIGH (ref 65–99)
Glucose-Capillary: 109 mg/dL — ABNORMAL HIGH (ref 65–99)

## 2015-07-25 LAB — OSMOLALITY, URINE: OSMOLALITY UR: 351 mosm/kg (ref 300–900)

## 2015-07-25 LAB — OSMOLALITY: OSMOLALITY: 249 mosm/kg — AB (ref 275–295)

## 2015-07-25 LAB — ALBUMIN: ALBUMIN: 2.6 g/dL — AB (ref 3.5–5.0)

## 2015-07-25 LAB — CORTISOL: Cortisol, Plasma: 9.5 ug/dL

## 2015-07-25 MED ORDER — SODIUM CHLORIDE 0.9 % IV SOLN
INTRAVENOUS | Status: DC
  Administered 2015-07-25 – 2015-07-27 (×4): via INTRAVENOUS

## 2015-07-25 NOTE — Progress Notes (Signed)
Paged MD with critical serum osmality level of 249, per lab person, [place].

## 2015-07-25 NOTE — Progress Notes (Signed)
Per MD, Pt not ready for d/c today.  Notified [facility].  Providence Crosby, LCSW Clinical Social Work [phone]

## 2015-07-25 NOTE — Consult Note (Signed)
Reason for Consult: Worsening hyponatremia Referring Physician: Pleas Koch M.D. [facility])  HPI: 65 year old African-American man with past medical history significant for diet-controlled diabetes, diastolic dysfunction and recently chronic hyponatremia (baseline sodium 130 to 134) who presented to the hospital with a right hip fracture while at the skilled nursing facility just recovering from left hip fracture.  His urine osmolality done 2 days ago was 516 with a urine sodium of 129. In spite of charted fluid restriction of 1.2 L and loop diuretic, his sodium level has dropped from 126 to 120. His TSH is normal at 2.0 and his last albumin from 07/02/15 was 3.3. His medication list was reviewed-he is not on an SSRI or thiazide. When I saw him, he had at least 30 ounces of fluid on history that he had already consumed with his dinner.  He has history of tobacco use but no suspicious appearing lesions on chest x-ray.   Past Medical History  Diagnosis Date  . Diabetes mellitus     regulated by diet  . Chronic back pain   . 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 Diabetic osteomyelitis (HCC) 03/29/2015  . Diabetic foot ulcer (HCC) 03/29/2015    Past Surgical History  Procedure Laterality Date  . Back surgery      screws & hardware to lower back  . 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 Femur im nail Left 06/18/2015    Procedure: INTRAMEDULLARY (IM) NAIL FEMORAL;  Surgeon: Tarry Kos, MD;  Location: [facility] OR;  Service: Orthopedics;  Laterality: Left;  . Femur im nail Right 07/21/2015    Procedure: Intramedullary nail femoral;  Surgeon: Tarry Kos, MD;  Location: [facility] OR;  Service: Orthopedics;  Laterality: Right;    History reviewed. No pertinent family history.  Social History:  reports that he has never smoked. He does not have any smokeless tobacco history on  file. He reports that he drinks about 3.6 oz of alcohol per week. He reports that he does not use illicit drugs.  Allergies:  Allergies  Allergen Reactions  . Penicillins Hives and Rash    Has patient had a PCN reaction causing immediate rash, facial/tongue/throat swelling, SOB or lightheadedness with hypotension: Yes Has patient had a PCN reaction causing severe rash involving mucus membranes or skin necrosis: Yes   Has patient had a PCN reaction that required hospitalization No Has patient had a PCN reaction occurring within the last 10 years: No If all of the above answers are "NO", then may proceed with Cephalosporin use.     Medications:  Scheduled: . enoxaparin (LOVENOX) injection  40 mg Subcutaneous Q24H  . feeding supplement (ENSURE ENLIVE)  237 mL Oral TID BM  . furosemide  40 mg Oral BID  . HYDROcodone-acetaminophen  1-2 tablet Oral 6 times per day  . insulin aspart  0-9 Units Subcutaneous TID WC  . insulin aspart  3 Units Subcutaneous TID WC  . methocarbamol  500 mg Oral QID  . mupirocin ointment   Nasal BID  . senna-docusate  1 tablet Oral BID  . tamsulosin  0.4 mg Oral Daily    BMP Latest Ref Rng 07/25/2015 07/25/2015 07/24/2015  Glucose 65 - 99 mg/dL 161(W) 960(A) 540(J)  BUN 6 - 20 mg/dL Creatinine 0.61 - 1.24 mg/dL 8.11(B) 1.47(W) 2.95  Sodium 135 - 145 mmol/L 120(L) 123(L)  125(L)  Potassium 3.5 - 5.1 mmol/L 3.6 4.1 4.2  Chloride 101 - 111 mmol/L 84(L) 88(L) 89(L)  CO2 22 - 32 mmol/L Calcium 8.9 - 10.3 mg/dL 4.0(J) 8.3(L) 8.6(L)   CBC Latest Ref Rng 07/24/2015 07/23/2015 07/22/2015  WBC 4.0 - 10.5 K/uL 7.8 8.2 8.0  Hemoglobin 13.0 - 17.0 g/dL 8.1(X) 9.1(Y) 7.8(G)  Hematocrit 39.0 - 52.0 % 25.0(L) 25.9(L) 26.5(L)  Platelets 150 - 400 K/uL 375 350 355    No results found.  Review of Systems  Constitutional: Positive for malaise/fatigue. Negative for fever, chills and diaphoresis.  HENT: Negative.   Eyes: Negative.   Respiratory: Negative.    Cardiovascular: Positive for chest pain. Negative for orthopnea and claudication.       Reports occasional pain on deep breath/movements from recent rib fractures  Gastrointestinal: Negative.   Genitourinary: Negative.   Musculoskeletal: Positive for joint pain. Negative for back pain and neck pain.       Bilateral hip pain  Skin: Negative.   Neurological: Positive for weakness.       Reports cramping in his lower extremities   Blood pressure 94/52, pulse 110, temperature 98.5 F (36.9 C), temperature source Oral, resp. rate 17, height  (1.753 m), weight 72.2 kg (159 lb 2.8 oz), SpO2 93 %. Physical Exam  Nursing note and vitals reviewed. Constitutional: He is oriented to person, place, and time. He appears well-developed. No distress.  Cachectic appearing  HENT:  Head: Normocephalic and atraumatic.  Nose: Nose normal.  Mouth/Throat: No oropharyngeal exudate.  Eyes: EOM are normal. Pupils are equal, round, and reactive to light. No scleral icterus.  Neck: Normal range of motion. Neck supple. No JVD present.  Cardiovascular: Normal heart sounds.   Regular tachycardia  Respiratory: Effort normal and breath sounds normal. He has no wheezes. He has no rales.  GI: Soft. Bowel sounds are normal. There is no tenderness. There is no rebound.  Musculoskeletal: He exhibits no edema.  Old scabs over knees with skin atrophy over toes.  Neurological: He is alert and oriented to person, place, and time.  Skin: Skin is warm and dry. No erythema.    Assessment/Plan: 1. Hyponatremia: Appears acute on chronic. Clinically on my physical exam, he actually appears volume contracted (dry mucosal membranes, poor skin turgor, hypotension/tachycardia)-hypovolemic hyponatremia. I will hold his furosemide and give him some normal saline overnight. He will continue with restriction of oral fluid intake which will limit worsening his hyponatremia. I will check a cortisol level and repeat serum  osmolality/urine osmolality. I will also check albumin level. 2. Recent right hip fracture while recovering from left hip fracture: Status post intramedullary nail 4 days ago (07/21/2015) and awaiting discharge to skilled nursing facility when stable.  3. Anemia: Likely anemia of chronic disease plus recent hip fracture, check iron studies  4. Hypocalcemia: Check albumin level to assess for correction, check PTH.   Amita Atayde K. 07/25/2015, 4:04 PM

## 2015-07-25 NOTE — Progress Notes (Signed)
Jeff Ferrell ZOX:[id number] DOB: 10-23-1950 DOA: 07/18/2015 PCP: [name], MD  Brief narrative: 65 y/o ? Diabetes mellitus type 2,  Sinus tachycardia with grade 1 diastolic dysfunction EF 65-70% Chronic hyponatremia prior osteomyelitis Hip fracture 06/11/15 with repair 12/30. Patient readmitted 07/02/15, orthopedics consulted and noted superficial infection with scant drainage-transitioned from vancomycin to oral medications and only walk with negative staph grew Patient readmitted on 07/21/15 with right-sided hip fracture at this time Found to have progressively worsening hyponatremia and Nephrology consulted formally 2.5.17  Consultants:  Orthopedics Dr. Deno Etienne  Procedures:  None yet  Antibiotics:  None   Subjective   Pain better  no othe rreal issues menstal status is intact   Objective    Interim History:   Telemetry: Sinus, sinus tach   Objective: Filed Vitals:   07/24/15 0926 07/24/15 1331 07/24/15 2223 07/25/15 0631  BP: 99/58 103/58 136/80 118/63  Pulse: 106 110 115 103  Temp: 98.3 F (36.8 C) 98.7 F (37.1 C) 98.3 F (36.8 C) 98.2 F (36.8 C)  TempSrc: Oral Oral Oral Oral  Resp: Height:      Weight:      SpO2: 99% 96% 97% 96%    Intake/Output Summary (Last 24 hours) at 07/25/15 1548 Last data filed at 07/25/15 0948  Gross per 24 hour  Intake    460 ml  Output   1450 ml  Net   -990 ml    Exam:  General: EOMI NCAT Cardiovascular: S1-S2 no murmur rub or gallop Respiratory: Clinically clear no added sound Abdomen: Slight tenderness Skin no lower extremity edema Neuro range of motion diminished  Data Reviewed: Basic Metabolic Panel:  Recent Labs Lab 07/23/15 0855 07/24/15 0620 07/24/15 1700 07/25/15 0601 07/25/15 1359  NA 126* 123* 125* 123* 120*  K 3.8 4.5 4.2 4.1 3.6  CL 94* 89* 89* 88* 84*  CO2 25 21* GLUCOSE 116* 111* 165* 155* 144*  BUN <5* <5* CREATININE 0.49* 0.55* 0.82 0.56* 0.55*   CALCIUM 8.7* 8.9 8.6* 8.3* 8.4*   Liver Function Tests: No results for input(s): AST, ALT, ALKPHOS, BILITOT, PROT, ALBUMIN in the last 168 hours. No results for input(s): LIPASE, AMYLASE in the last 168 hours. No results for input(s): AMMONIA in the last 168 hours. CBC:  Recent Labs Lab 07/18/15 2339 07/20/15 0509 07/21/15 1827 07/22/15 0713 07/23/15 0855 07/24/15 0620  WBC 7.2 8.5 7.5 8.0 8.2 7.8  NEUTROABS 5.7  --   --  5.6  --   --   HGB 11.1* 10.6* 11.1* 9.1* 8.7* 8.6*  HCT 33.6* 32.7* 32.6* 26.5* 25.9* 25.0*  MCV 84.6 85.2 82.3 82.6 81.7 81.4  PLT 369 409* 337 355 350 375   Cardiac Enzymes: No results for input(s): CKTOTAL, CKMB, CKMBINDEX, TROPONINI in the last 168 hours. BNP: Invalid input(s): POCBNP CBG:  Recent Labs Lab 07/24/15 1231 07/24/15 1647 07/24/15 2222 07/25/15 0758 07/25/15 1154  GLUCAP 77 189* 138* 144* 124*    Recent Results (from the past 240 hour(s))  Surgical pcr screen     Status: Abnormal   Collection Time: 07/19/15  8:55 AM  Result Value Ref Range Status   MRSA, PCR POSITIVE (A) NEGATIVE Final    Comment: RESULT CALLED TO, READ BACK BY AND VERIFIED WITH: AMY FARGO,RN [id number] @ 1417 BY J SCOTTON    Staphylococcus aureus POSITIVE (A) NEGATIVE Final    Comment:        The  Xpert SA Assay (FDA approved for NASAL specimens in patients over 42 years of age), is one component of a comprehensive surveillance program.  Test performance has been validated by [facility] for patients greater than or equal to 32 year old. It is not intended to diagnose infection nor to guide or monitor treatment. RESULT CALLED TO, READ BACK BY AND VERIFIED WITH: AMY FARGO,RN [id number] @ 1417 BY J SCOTTON      Studies:              All Imaging reviewed and is as per above notation   Scheduled Meds: . enoxaparin (LOVENOX) injection  40 mg Subcutaneous Q24H  . feeding supplement (ENSURE ENLIVE)  237 mL Oral TID BM  . furosemide  40 mg Oral BID  .  HYDROcodone-acetaminophen  1-2 tablet Oral 6 times per day  . insulin aspart  0-9 Units Subcutaneous TID WC  . insulin aspart  3 Units Subcutaneous TID WC  . methocarbamol  500 mg Oral QID  . mupirocin ointment   Nasal BID  . senna-docusate  1 tablet Oral BID  . tamsulosin  0.4 mg Oral Daily   Continuous Infusions:     Assessment/Plan:  1. Right hip fracture-patient has been seen by orthopedics and is asking what procedure will be done. I will defer to orthopedics plan of care.  per discretion Ortho services-appreciate their input into  Anticoagulation post-op-lovenox Weight bearing  As tolerated per Ortho Pain management--I have increased his medications to scheduled Vicodin and scheduled Methocarbamol Follow-up services required 2. Acute blood loss anemia-expected. Hemoglobin 9-8.7 recheck in am as could be a casue for poor renal perfusion 3. Diabetes mellitus type 2-prior to admission usually on Januvia 50 daily and not on sliding scale. cbg's 125-167. Changed to QID ACHS coverage 4. Acute  On Chronic euvolemic hyponatremia- 2/2 to ABLA + polydipsiaMeasured Serum OSM 254,  Uosm 516  and urine sodium 129.  Volume restrict, lasix started 40 od and increased to bid 2.5.17--  D/w Dr. Allena Katz of Neprhology. Rpt labs show sodium 120.  Formal eval from nephro sought 5. Compensated heart failure grade 1 diastolic dysfunction-hold medications for now 6. Acute urinary retention-probably from narcotics-add flomax.  Clamp foley and unclamp if sensation to void 7. Moderate malnutrition feeding supplements to be given -this is moderate malnutirtion 8. BPH continue Flomax 0.4  Will need PT OT to follow based on recommendations from orthopedics Await input Neprhology Anticipate return to skilled nursing in am if sodium improves   Pleas Koch, MD  [facility] Pager [phone] 07/25/2015, 3:48 PM    LOS: 6 days

## 2015-07-26 LAB — RENAL FUNCTION PANEL
Albumin: 2.5 g/dL — ABNORMAL LOW (ref 3.5–5.0)
Anion gap: 9 (ref 5–15)
CHLORIDE: 89 mmol/L — AB (ref 101–111)
CO2: 25 mmol/L (ref 22–32)
CREATININE: 0.58 mg/dL — AB (ref 0.61–1.24)
Calcium: 8.5 mg/dL — ABNORMAL LOW (ref 8.9–10.3)
GFR calc Af Amer: >60 mL/min (ref >60)
GFR calc non Af Amer: >60 mL/min (ref >60)
GLUCOSE: 125 mg/dL — AB (ref 65–99)
POTASSIUM: 3.8 mmol/L (ref 3.5–5.1)
Phosphorus: 3.3 mg/dL (ref 2.5–4.6)
Sodium: 123 mmol/L — ABNORMAL LOW (ref 135–145)

## 2015-07-26 LAB — GLUCOSE, CAPILLARY
GLUCOSE-CAPILLARY: 151 mg/dL — AB (ref 65–99)
Glucose-Capillary: 128 mg/dL — ABNORMAL HIGH (ref 65–99)
Glucose-Capillary: 86 mg/dL (ref 65–99)
Glucose-Capillary: 137 mg/dL — ABNORMAL HIGH (ref 65–99)

## 2015-07-26 LAB — PARATHYROID HORMONE, INTACT (NO CA): PTH: 11 pg/mL — AB (ref 15–65)

## 2015-07-26 NOTE — Progress Notes (Signed)
S: CO pain Rt hip and spasm Rt leg O:BP 108/53 mmHg  Pulse 96  Temp(Src) 98.3 F (36.8 C) (Oral)  Resp 16  Ht  (1.753 m)  Wt 72.2 kg (159 lb 2.8 oz)  BMI 23.49 kg/m2  SpO2 97%  Intake/Output Summary (Last 24 hours) at 07/26/15 0755 Last data filed at 07/26/15 0556  Gross per 24 hour  Intake   1660 ml  Output   2650 ml  Net   -990 ml   Weight change:  Gen: Awake and alert CVS:RRR Resp:clear Abd:+ BS abd muscles tensed on Rt side along with spasm of Rt thigh muscles Ext: No edema.  Multiple scabs on LE NEURO: CNI Ox3, no asterixis   . enoxaparin (LOVENOX) injection  40 mg Subcutaneous Q24H  . feeding supplement (ENSURE ENLIVE)  237 mL Oral TID BM  . HYDROcodone-acetaminophen  1-2 tablet Oral 6 times per day  . insulin aspart  0-9 Units Subcutaneous TID WC  . insulin aspart  3 Units Subcutaneous TID WC  . methocarbamol  500 mg Oral QID  . mupirocin ointment   Nasal BID  . senna-docusate  1 tablet Oral BID  . tamsulosin  0.4 mg Oral Daily   No results found. BMET    Component Value Date/Time   NA 123* 07/26/2015 0507   K 3.8 07/26/2015 0507   CL 89* 07/26/2015 0507   CO2 25 07/26/2015 0507   GLUCOSE 125* 07/26/2015 0507   BUN <5* 07/26/2015 0507   CREATININE 0.58* 07/26/2015 0507   CALCIUM 8.5* 07/26/2015 0507   GFRNONAA >60 07/26/2015 0507   GFRAA >60 07/26/2015 0507   CBC    Component Value Date/Time   WBC 7.8 07/24/2015 0620   RBC 3.07* 07/24/2015 0620   HGB 8.6* 07/24/2015 0620   HCT 25.0* 07/24/2015 0620   PLT 375 07/24/2015 0620   MCV 81.4 07/24/2015 0620   MCH 28.0 07/24/2015 0620   MCHC 34.4 07/24/2015 0620   RDW 13.3 07/24/2015 0620   LYMPHSABS 1.1 07/22/2015 0713   MONOABS 1.1* 07/22/2015 0713   EOSABS 0.1 07/22/2015 0713   BASOSABS 0.0 07/22/2015 0713     Assessment:  1. Hyponatremia 2. Rt hip fx 3. Anemia and iron deficiency   Plan: 1.  Will leave IV fluids going today but not sure volume depletion is the cause.  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 SIADH.  His urine output is greater than input so for now will continue but if Na not higher tomorrow then will DC IV fluids 2. Recommend IV feraheme 3. Daily SNa   Jeff Ferrell T

## 2015-07-26 NOTE — Progress Notes (Signed)
Physical Therapy Treatment Patient Details Name: Jeff Ferrell MRN: [id number] DOB: 05-Jun-1951 Today's Date: 07/26/2015    History of Present Illness 65 yo male adm with bil hip pain, L hip incision dehiscence 07/01/15; PMHx:  L great toe amp,  multiple rib fxs, L hip fx --s/p Im nail  06/17/16, back surgery and chronic pain, pt suffers from severe LE and back muscle spasms per his report, pt was at [facility] following L hip fracture on 06/18/15 and noted right hip pain after transfer from w/c to bed at [facility], there was no fall or mode of injury. Pt was with resultant R femoral neck fracture and is WBAT per chart review.    PT Comments    Pt continues to have LE spasms that he describes as R worse then left and extending from right groin through hip into back. Occur with all mvmt. He also c/o sensation changes bilateral feet which have worsened to the point that he cannot discern what position his feet are in in relation to one another, eg touching, ankles crossed, etc. However, pt tolerated bed to chair transfer with full standing position and pivoting feet with +2 max A.    Follow Up Recommendations  SNF;Supervision/Assistance - 24 hour     Equipment Recommendations  Wheelchair (measurements PT);Wheelchair cushion (measurements PT)    Recommendations for Other Services       Precautions / Restrictions Precautions Precautions: Fall Precaution Comments: bil LE extreme spasms with bil knee flexion maintained throughout, R>L Restrictions Weight Bearing Restrictions: Yes RLE Weight Bearing: Weight bearing as tolerated LLE Weight Bearing: Weight bearing as tolerated    Mobility  Bed Mobility Overal bed mobility: Needs Assistance;+2 for physical assistance Bed Mobility: Supine to Sit     Supine to sit: +2 for physical assistance;Mod assist     General bed mobility comments: legs kept together throughout pivot to EOB, vc's for use of rail with LUE. Mod A to LE's as well as mod A  at trunk to get to sitting position.   Transfers Overall transfer level: Needs assistance   Transfers: Sit to/from Stand;Stand Pivot Transfers Sit to Stand: Max assist;+2 physical assistance Stand pivot transfers: Max assist;+2 physical assistance       General transfer comment: pt able to stand all the way up today with +2 max A but reports that he cannot feel the floor, feet had to be placed properly by therapist before beginning transfer. Max A +2 for pivot, pt unable to step feet but able to rotate. Bilateral knees and hips maintained in partial flexion  Ambulation/Gait             General Gait Details: pt unable   Stairs            Wheelchair Mobility    Modified Rankin (Stroke Patients Only)       Balance Overall balance assessment: Needs assistance Sitting-balance support: Bilateral upper extremity supported Sitting balance-Leahy Scale: Poor Sitting balance - Comments: posterior lean, min A needed to maintain balance except for short bouts where he could wt shift fwd and maintain position, 5-10 secs, before leaning back again Postural control: Posterior lean Standing balance support: Bilateral upper extremity supported Standing balance-Leahy Scale: Zero                      Cognition Arousal/Alertness: Awake/alert Behavior During Therapy: WFL for tasks assessed/performed Overall Cognitive Status: Within Functional Limits for tasks assessed  Exercises General Exercises - Lower Extremity Long Arc Quad: AROM;Both;5 reps;Seated;Limitations Long [employer] Limitations: able to perform through partial range Heel Slides: AROM;Both;5 reps;Seated;Limitations Heel Slides Limitations: able to perform through partial range    General Comments General comments (skin integrity, edema, etc.): several times pt asked what position his feet were in, thought they were separated when they were touching or once even crossed at ankle.        Pertinent Vitals/Pain Pain Assessment: Faces Faces Pain Scale: Hurts whole lot Pain Location: right hip and back> left Pain Descriptors / Indicators: Spasm Pain Intervention(s): Premedicated before session;Limited activity within patient's tolerance;Monitored during session;Repositioned    Home Living                      Prior Function            PT Goals (current goals can now be found in the care plan section) Acute Rehab PT Goals Patient Stated Goal: be able to get rid of the spasms and walk PT Goal Formulation: With patient Time For Goal Achievement: 08/05/15 Potential to Achieve Goals: Fair Progress towards PT goals: Progressing toward goals    Frequency  Min 2X/week    PT Plan Current plan remains appropriate    Co-evaluation             End of Session Equipment Utilized During Treatment: Gait belt Activity Tolerance: Patient limited by pain Patient left: in chair;with chair alarm set;with call bell/phone within reach     Time: 1214-1238 PT Time Calculation (min) (ACUTE ONLY): 24 min  Charges:  $Therapeutic Activity: 23-37 mins                    G Codes:     Lyanne Co, PT  Acute Rehab Services  [phone]  Lyanne Co 07/26/2015, 12:58 PM

## 2015-07-26 NOTE — Progress Notes (Signed)
[address] ZOX:[id number] DOB: 12-27-1950 DOA: 07/18/2015 PCP: [name], MD  Brief narrative: 65 y/o ? Diabetes mellitus type 2,  Sinus tachycardia with grade 1 diastolic dysfunction EF 65-70% Chronic hyponatremia prior osteomyelitis Hip fracture 06/11/15 with repair 12/30. Patient readmitted 07/02/15, orthopedics consulted and noted superficial infection with scant drainage-transitioned from vancomycin to oral medications and only walk with negative staph grew Patient readmitted on 07/21/15 with right-sided hip fracture at this time Found to have progressively worsening hyponatremia and Nephrology consulted formally 2.5.17  Consultants:  Orthopedics Dr. Deno Etienne  Procedures:  None yet  Antibiotics:  None   Subjective   Fair  Still has some pain    Objective    Interim History:   Telemetry: Sinus, sinus tach -  Objective: Filed Vitals:   07/25/15 0631 07/25/15 1557 07/25/15 2158 07/26/15 0554  BP: 118/63 94/52 98/59  108/53  Pulse: 103 110 109 96  Temp: 98.2 F (36.8 C) 98.5 F (36.9 C) 98.3 F (36.8 C) 98.3 F (36.8 C)  TempSrc: Oral Oral Oral   Resp: Height:      Weight:      SpO2: 96% 93% 98% 97%    Intake/Output Summary (Last 24 hours) at 07/26/15 1210 Last data filed at 07/26/15 0900  Gross per 24 hour  Intake   1660 ml  Output   1550 ml  Net    110 ml   - Exam:  General: EOMI NCAT Cardiovascular: S1-S2 no murmur rub or gallop Respiratory: Clinically clear no added sound Abdomen: Slight tenderness Skin no lower extremity edema Neuro range of motion diminished  Data Reviewed: Basic Metabolic Panel:  Recent Labs Lab 07/24/15 0620 07/24/15 1700 07/25/15 0601 07/25/15 1359 07/26/15 0507  NA 123* 125* 123* 120* 123*  K 4.5 4.2 4.1 3.6 3.8  CL 89* 89* 88* 84* 89*  CO2 21* GLUCOSE 111* 165* 155* 144* 125*  BUN <5* <5*  CREATININE 0.55* 0.82 0.56* 0.55* 0.58*  CALCIUM 8.9 8.6* 8.3* 8.4* 8.5*   PHOS  --   --   --   --  3.3   Liver Function Tests:  Recent Labs Lab 07/25/15 1753 07/26/15 0507  ALBUMIN 2.6* 2.5*   No results for input(s): LIPASE, AMYLASE in the last 168 hours. No results for input(s): AMMONIA in the last 168 hours. CBC:  Recent Labs Lab 07/20/15 0509 07/21/15 1827 07/22/15 0713 07/23/15 0855 07/24/15 0620  WBC 8.5 7.5 8.0 8.2 7.8  NEUTROABS  --   --  5.6  --   --   HGB 10.6* 11.1* 9.1* 8.7* 8.6*  HCT 32.7* 32.6* 26.5* 25.9* 25.0*  MCV 85.2 82.3 82.6 81.7 81.4  PLT 409* 337 355 350 375   Cardiac Enzymes: No results for input(s): CKTOTAL, CKMB, CKMBINDEX, TROPONINI in the last 168 hours. BNP: Invalid input(s): POCBNP CBG:  Recent Labs Lab 07/25/15 1154 07/25/15 1729 07/25/15 2155 07/26/15 0827 07/26/15 1152  GLUCAP 124* 165* 109* 128* 86    Recent Results (from the past 240 hour(s))  Surgical pcr screen     Status: Abnormal   Collection Time: 07/19/15  8:55 AM  Result Value Ref Range Status   MRSA, PCR POSITIVE (A) NEGATIVE Final    Comment: RESULT CALLED TO, READ BACK BY AND VERIFIED WITH: AMY FARGO,RN [id number] @ 1417 BY J SCOTTON    Staphylococcus aureus POSITIVE (A) NEGATIVE Final    Comment:  The Xpert SA Assay (FDA approved for NASAL specimens in patients over 46 years of age), is one component of a comprehensive surveillance program.  Test performance has been validated by [facility] for patients greater than or equal to 9 year old. It is not intended to diagnose infection nor to guide or monitor treatment. RESULT CALLED TO, READ BACK BY AND VERIFIED WITH: AMY FARGO,RN [id number] @ 1417 BY J SCOTTON      Studies:              All Imaging reviewed and is as per above notation   Scheduled Meds: . enoxaparin (LOVENOX) injection  40 mg Subcutaneous Q24H  . feeding supplement (ENSURE ENLIVE)  237 mL Oral TID BM  . HYDROcodone-acetaminophen  1-2 tablet Oral 6 times per day  . insulin aspart  0-9 Units Subcutaneous  TID WC  . insulin aspart  3 Units Subcutaneous TID WC  . methocarbamol  500 mg Oral QID  . mupirocin ointment   Nasal BID  . senna-docusate  1 tablet Oral BID  . tamsulosin  0.4 mg Oral Daily   Continuous Infusions: . sodium chloride 75 mL/hr at 07/26/15 0828   -  Assessment/Plan:  1. Right hip fracture-patient has been seen by orthopedics and is asking what procedure will be done. I will defer to orthopedics plan of care.  per discretion Ortho services-appreciate their input into  Anticoagulation post-op-lovenox Weight bearing  As tolerated per Ortho Pain management--I have increased his medications to scheduled Vicodin and scheduled Methocarbamol Follow-up services required 2. Acute blood loss anemia-expected. Hemoglobin 9--->8.6 recheck in am as could be a casue for poor renal perfusion 3. Diabetes mellitus type 2-prior to admission usually on Januvia 50 daily and not on sliding scale. cbg's 86-128. Changed to QID ACHS coverage. 4. Acute-on-Chronic euvolemic hyponatremia- 2/2 to ABLA + polydipsia.  Measured Serum OSM 254,  Uosm 516  and urine sodium 129.  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  continue Low dose Iv saline 75 cc/h.  Rest as per nephrology interms of next plans 5. Compensated heart failure grade 1 diastolic dysfunction-hold medications for now 6. Acute urinary retention-probably from narcotics-add flomax.  Clamp foley and unclamp if sensation to void 7. Moderate malnutrition feeding supplements to be given -this is moderate malnutirtion 8. BPH continue Flomax 0.4  Anticipate return to skilled nursing once sodium improves durably  Pleas Koch, MD  [facility] Pager [phone] 07/26/2015, 12:10 PM    LOS: 7 days      repeat labs

## 2015-07-27 LAB — BASIC METABOLIC PANEL
ANION GAP: 11 (ref 5–15)
BUN: <5 mg/dL — ABNORMAL LOW (ref 6–20)
CALCIUM: 8.4 mg/dL — AB (ref 8.9–10.3)
CO2: 23 mmol/L (ref 22–32)
Chloride: 93 mmol/L — ABNORMAL LOW (ref 101–111)
Creatinine, Ser: 0.53 mg/dL — ABNORMAL LOW (ref 0.61–1.24)
GFR calc non Af Amer: >60 mL/min (ref >60)
Glucose, Bld: 132 mg/dL — ABNORMAL HIGH (ref 65–99)
Potassium: 3.9 mmol/L (ref 3.5–5.1)
SODIUM: 127 mmol/L — AB (ref 135–145)

## 2015-07-27 LAB — GLUCOSE, CAPILLARY
GLUCOSE-CAPILLARY: 246 mg/dL — AB (ref 65–99)
Glucose-Capillary: 115 mg/dL — ABNORMAL HIGH (ref 65–99)
Glucose-Capillary: 125 mg/dL — ABNORMAL HIGH (ref 65–99)
Glucose-Capillary: 118 mg/dL — ABNORMAL HIGH (ref 65–99)

## 2015-07-27 MED ORDER — BACLOFEN 10 MG PO TABS
5.0000 mg | ORAL_TABLET | Freq: Two times a day (BID) | ORAL | Status: DC
  Administered 2015-07-27 – 2015-07-28 (×3): 5 mg via ORAL
  Filled 2015-07-27 (×2): qty 1

## 2015-07-27 NOTE — Clinical Documentation Improvement (Signed)
Hospitalist  Can the diagnosis of Malnutrition be further specified?   Document Severity - Severe(third degree), Moderate (second degree), Mild (first degree)  Other condition  Unable to clinically determine  Document any associated diagnoses/conditions   Supporting Information: :  Dr. Gloriann Loan documents moderate malnutrition, RD documents severe malnutrition and Dr Rito Ehrlich documents severe malnutrition.  Please clarify the correct malnutrition for the record.   Please exercise your independent, professional judgment when responding. A specific answer is not anticipated or expected.   Thank Modesta Messing [facility] [place] [phone]

## 2015-07-27 NOTE — Progress Notes (Signed)
CSW informed facility representative Janie at [facility] that patient is not medically stable and ready for discharge on today. Anticipated DC is tomorrow. Wille Celeste was appreciative of update.   Fernande Boyden, LCSWA Clinical Social Worker [facility] Ph: [phone]

## 2015-07-27 NOTE — Progress Notes (Signed)
Physical Therapy Treatment Patient Details Name: Jeff Ferrell MRN: [id number] DOB: Sep 22, 1950 Today's Date: 07/27/2015    History of Present Illness 65 yo male adm with bil hip pain, L hip incision dehiscence 07/01/15; PMHx:  L great toe amp,  multiple rib fxs, L hip fx --s/p Im nail  06/17/16, back surgery and chronic pain, pt suffers from severe LE and back muscle spasms per his report, pt was at [facility] following L hip fracture on 06/18/15 and noted right hip pain after transfer from w/c to bed at [facility], there was no fall or mode of injury. Pt was with resultant R femoral neck fracture and is WBAT per chart review.    PT Comments    Patient is making small gains toward PT goals and eager to participate in therapy. Patient premedicated prior to session but continues to be limited by muscle spasms in bilateral LE. Continue to progress as tolerated with anticipated d/c to SNF for further skilled PT services. Continue to progress as tolerated.     Follow Up Recommendations  SNF;Supervision/Assistance - 24 hour     Equipment Recommendations  Wheelchair (measurements PT);Wheelchair cushion (measurements PT)    Recommendations for Other Services       Precautions / Restrictions Precautions Precautions: Fall Precaution Comments: bil LE extreme spasms with bil knee flexion maintained throughout, R>L Restrictions Weight Bearing Restrictions: Yes RLE Weight Bearing: Weight bearing as tolerated LLE Weight Bearing: Weight bearing as tolerated    Mobility  Bed Mobility Overal bed mobility: Needs Assistance;+2 for physical assistance Bed Mobility: Supine to Sit     Supine to sit: +2 for physical assistance;Mod assist     General bed mobility comments: pt unable to uncross bilat LE without assistance; vc for sequencing and technique with use of bed rails; assist to bring bilat LE to EOB and elevate trunk into sitting  Transfers Overall transfer level: Needs assistance Equipment  used: Rolling walker (2 wheeled) Transfers: Sit to/from Stand Sit to Stand: +2 physical assistance;Mod assist;Max assist        Lateral/Scoot Transfers: Min assist;+2 physical assistance;From elevated surface General transfer comment: min A for later scoot transfer EOB to w/c with bed elevated and vc for technique and increased time needed; max A +2 first trial; mod A +2 second trial; max A + 2 to maintain standing; pt with bilat flexed knees throughout session and unable to extend knees in standing; pt reported that he cannot feel his feet on the floor and required assistance to position them for standing  Ambulation/Gait             General Gait Details: pt unable   Stairs            Wheelchair Mobility    Modified Rankin (Stroke Patients Only)       Balance Overall balance assessment: Needs assistance Sitting-balance support: Bilateral upper extremity supported Sitting balance-Leahy Scale: Poor Sitting balance - Comments: posterior lean, min A needed to maintain balance except for short bouts where he could wt shift fwd and maintain position, 5-10 secs, before leaning back again   Standing balance support: Bilateral upper extremity supported Standing balance-Leahy Scale: Zero                      Cognition Arousal/Alertness: Awake/alert Behavior During Therapy: WFL for tasks assessed/performed Overall Cognitive Status: Within Functional Limits for tasks assessed  Exercises General Exercises - Lower Extremity Long Arc Quad: AROM;Both;5 reps;Seated Long Arc Quad Limitations: able to perform through partial range Other Exercises Other Exercises: contract-relax stretching to improve bilat knee extension (3X20 seconds each; unable to achieve full extension)    General Comments        Pertinent Vitals/Pain Pain Assessment: 0-10 Pain Score: 8  Pain Location: Bilat LE Pain Descriptors / Indicators: Spasm Pain  Intervention(s): Monitored during session;Premedicated before session;Repositioned    Home Living                      Prior Function            PT Goals (current goals can now be found in the care plan section) Acute Rehab PT Goals Patient Stated Goal: be able to stand PT Goal Formulation: With patient Time For Goal Achievement: 08/05/15 Potential to Achieve Goals: Fair Progress towards PT goals: Progressing toward goals    Frequency  Min 2X/week    PT Plan Current plan remains appropriate    Co-evaluation   Reason for Co-Treatment: Complexity of the patient's impairments (multi-system involvement);For patient/therapist safety PT goals addressed during session: Mobility/safety with mobility;Strengthening/ROM OT goals addressed during session: ADL's and self-care;Strengthening/ROM     End of Session Equipment Utilized During Treatment: Gait belt Activity Tolerance: Patient limited by pain Patient left: in chair;with chair alarm set;with call bell/phone within reach     Time: 1210-1247 PT Time Calculation (min) (ACUTE ONLY): 37 min  Charges:  $Therapeutic Activity: 8-22 mins                    G Codes:      [name], PTA Pager: [phone]   07/27/2015, 3:20 PM

## 2015-07-27 NOTE — Progress Notes (Signed)
Jeff Ferrell WUJ:[id number] DOB: 1951/04/25 DOA: 07/18/2015 PCP: [name], MD  Brief narrative: 65 y/o ? Diabetes mellitus type 2,  Sinus tachycardia with grade 1 diastolic dysfunction EF 65-70% Chronic hyponatremia prior osteomyelitis Hip fracture 06/11/15 with repair 12/30. Patient readmitted 07/02/15, orthopedics consulted and noted superficial infection with scant drainage-transitioned from vancomycin to oral medications and only walk with negative staph grew Patient readmitted on 07/21/15 with right-sided hip fracture at this time Found to have progressively worsening hyponatremia and Nephrology consulted formally 2.5.17  Consultants:  Orthopedics Dr. Deno Etienne  Procedures:  None yet  Antibiotics:  None   Subjective       Objective    Interim History:   Telemetry: Sinus, sinus tach -  Objective: Filed Vitals:   07/26/15 0554 07/26/15 1426 07/26/15 2110 07/27/15 0559  BP: 108/53 114/57 123/81 118/64  Pulse: 96 108 99 102  Temp: 98.3 F (36.8 C) 98.6 F (37 C) 98.2 F (36.8 C) 98.1 F (36.7 C)  TempSrc:  Oral Oral Oral  Resp: Height:      Weight:      SpO2: 97% 99% 99% 96%    Intake/Output Summary (Last 24 hours) at 07/27/15 1002 Last data filed at 07/27/15 0900  Gross per 24 hour  Intake   1985 ml  Output   2650 ml  Net   -665 ml   - Exam:  General: EOMI NCAT Cardiovascular: S1-S2 no murmur rub or gallop Respiratory: Clinically clear no added sound Abdomen: Slight tenderness Skin no lower extremity edema Neuro range of motion diminished  Data Reviewed: Basic Metabolic Panel:  Recent Labs Lab 07/24/15 1700 07/25/15 0601 07/25/15 1359 07/26/15 0507 07/27/15 0550  NA 125* 123* 120* 123* 127*  K 4.2 4.1 3.6 3.8 3.9  CL 89* 88* 84* 89* 93*  CO2 GLUCOSE 165* 155* 144* 125* 132*  BUN <5* <5*  CREATININE 0.82 0.56* 0.55* 0.58* 0.53*  CALCIUM 8.6* 8.3* 8.4* 8.5* 8.4*  PHOS  --   --   --  3.3  --     Liver Function Tests:  Recent Labs Lab 07/25/15 1753 07/26/15 0507  ALBUMIN 2.6* 2.5*   No results for input(s): LIPASE, AMYLASE in the last 168 hours. No results for input(s): AMMONIA in the last 168 hours. CBC:  Recent Labs Lab 07/21/15 1827 07/22/15 0713 07/23/15 0855 07/24/15 0620  WBC 7.5 8.0 8.2 7.8  NEUTROABS  --  5.6  --   --   HGB 11.1* 9.1* 8.7* 8.6*  HCT 32.6* 26.5* 25.9* 25.0*  MCV 82.3 82.6 81.7 81.4  PLT 337 355 350 375   Cardiac Enzymes: No results for input(s): CKTOTAL, CKMB, CKMBINDEX, TROPONINI in the last 168 hours. BNP: Invalid input(s): POCBNP CBG:  Recent Labs Lab 07/26/15 0827 07/26/15 1152 07/26/15 1649 07/26/15 2105 07/27/15 0731  GLUCAP 128* 86 151* 137* 115*    Recent Results (from the past 240 hour(s))  Surgical pcr screen     Status: Abnormal   Collection Time: 07/19/15  8:55 AM  Result Value Ref Range Status   MRSA, PCR POSITIVE (A) NEGATIVE Final    Comment: RESULT CALLED TO, READ BACK BY AND VERIFIED WITH: AMY FARGO,RN [id number] @ 1417 BY J SCOTTON    Staphylococcus aureus POSITIVE (A) NEGATIVE Final    Comment:        The Xpert SA Assay (FDA approved for NASAL specimens in patients over 21 years of  age), is one component of a comprehensive surveillance program.  Test performance has been validated by [facility] for patients greater than or equal to 8 year old. It is not intended to diagnose infection nor to guide or monitor treatment. RESULT CALLED TO, READ BACK BY AND VERIFIED WITH: AMY FARGO,RN [id number] @ 1417 BY J SCOTTON      Studies:              All Imaging reviewed and is as per above notation   Scheduled Meds: . enoxaparin (LOVENOX) injection  40 mg Subcutaneous Q24H  . feeding supplement (ENSURE ENLIVE)  237 mL Oral TID BM  . HYDROcodone-acetaminophen  1-2 tablet Oral 6 times per day  . insulin aspart  0-9 Units Subcutaneous TID WC  . insulin aspart  3 Units Subcutaneous TID WC  . methocarbamol   500 mg Oral QID  . mupirocin ointment   Nasal BID  . senna-docusate  1 tablet Oral BID  . tamsulosin  0.4 mg Oral Daily   Continuous Infusions: . sodium chloride 75 mL/hr at 07/26/15 2327   -  Assessment/Plan:  1. Right hip fracture-patient has been seen by orthopedics and is asking what procedure will be done. I will defer to orthopedics plan of care.  per discretion Ortho services-appreciate their input into  Anticoagulation post-op-lovenox Weight bearing  As tolerated per Ortho Pain management--I have increased his medications to scheduled Vicodin and scheduled Methocarbamol.  I added baclofen lo dose 5 mg bid on 2/7 to see if spasms are helped by this Follow-up services required 2. Acute blood loss anemia-expected. Hemoglobin 9--->8.6 recheck in am. 3. Diabetes mellitus type 2-prior to admission usually on Januvia 50 daily and not on sliding scale. cbg's 115-132. Changed to QID ACHS coverage. 4. Acute-on-Chronic euvolemic hyponatremia- 2/2 to ABLA + polydipsia.  Measured Serum OSM 254,  Uosm 516  and urine sodium 129.  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  continue Low dose Iv saline 75 cc/h as per Nephrology.  Sodium 120-123-->127.  Recheck in am.  appreciate Nephrology input 5. Compensated heart failure grade 1 diastolic dysfunction-hold medications for now 6. Acute urinary retention-probably from narcotics-add flomax.  Clamp foley and unclamp if sensation to void 7. Severe malnutrition feeding supplements to be given 8. BPH continue Flomax 0.4  Anticipate return to skilled nursing once sodium improves durably in next 24-48 hrs  Pleas Koch, MD  [facility] Pager [phone] 07/27/2015, 10:02 AM    LOS: 8 days      repeat labs

## 2015-07-27 NOTE — Progress Notes (Signed)
Occupational Therapy Treatment Patient Details Name: Jeff Ferrell MRN: [id number] DOB: [birth year] Today's Date: 07/27/2015    History of present illness 65 yo male adm with bil hip pain, L hip incision dehiscence 07/01/15; PMHx:  L great toe amp,  multiple rib fxs, L hip fx --s/p Im nail  06/17/16, back surgery and chronic pain, pt suffers from severe LE and back muscle spasms per his report, pt was at [facility] following L hip fracture on 06/18/15 and noted right hip pain after transfer from w/c to bed at [facility], there was no fall or mode of injury. Pt was with resultant R femoral neck fracture and is WBAT per chart review.   OT comments  This 65 yo male admitted with above presents to acute OT today with making progress with lateral transfers and showed progress with sit<> from first to second try. He is limited by spasms which cause pain and tightness/contractures in his LEs. He will continue to benefit from acute OT with follow up at SNF.   Follow Up Recommendations  SNF;Supervision/Assistance - 24 hour    Equipment Recommendations   (TBD next venue)       Precautions / Restrictions Precautions Precautions: Fall Precaution Comments: bil LE extreme spasms with bil knee flexion maintained throughout, R>L Restrictions Weight Bearing Restrictions: No RLE Weight Bearing: Weight bearing as tolerated LLE Weight Bearing: Weight bearing as tolerated       Mobility Bed Mobility Overal bed mobility: Needs Assistance;+2 for physical assistance Bed Mobility: Supine to Sit     Supine to sit: +2 for physical assistance;Mod assist     General bed mobility comments: legs crossed when entered room and throughout pivot to EOB, Mod A to LE's as well as mod A at trunk to get to sitting position.   Transfers Overall transfer level: Needs assistance Equipment used: Rolling walker (2 wheeled) Transfers: Sit to/from Stand;Lateral/Scoot Transfers Sit to Stand:  (Max A +2 first time (stood for  1 minute); Mod A +2 second time (stood 30 seconds); Max A to maintain standing)        Lateral/Scoot Transfers: Min assist;+2 physical assistance;From elevated surface (bed (slightly elevated) to W/C going to pt's left) General transfer comment: pt able to stand only about 2/3 way up today with +2 max A (first) + 2 Mod (2nd). He continues to report that he cannot feel the floor under his feet. He attempted to place his feet properly before sit<>stand, but still needed A for proper width apart by therapist before sit<>stand. Once in standing the longer he stood the more his RLE would draw up with resultant more weight on LLE and RLE on toes.    Balance Overall balance assessment: Needs assistance Sitting-balance support: Bilateral upper extremity supported Sitting balance-Leahy Scale: Poor Sitting balance - Comments: no posterior lean today in sitting, but did need Bil UE support to maintain sitting   Standing balance support: Bilateral upper extremity supported Standing balance-Leahy Scale: Zero                     ADL Overall ADL's : Needs assistance/impaired                         Toilet Transfer: Minimal assistance;+2 for physical assistance (lateral scoot from bed>W/C)   Toileting- Clothing Manipulation and Hygiene: Total assistance (with +2 Max-Mod A sit<>stand)  Cognition   Behavior During Therapy: WFL for tasks assessed/performed Overall Cognitive Status: Within Functional Limits for tasks assessed                                    Pertinent Vitals/ Pain       Pain Assessment: 0-10 Pain Score: 8  Pain Location: Bil LEs Pain Descriptors / Indicators: Spasm Pain Intervention(s): Monitored during session;Premedicated before session         Frequency Min 2X/week     Progress Toward Goals  OT Goals(current goals can now be found in the care plan section)  Progress towards OT goals: Progressing toward  goals (lateral transfers)     Plan Discharge plan remains appropriate    Co-evaluation    PT/OT/SLP Co-Evaluation/Treatment: Yes Reason for Co-Treatment: Complexity of the patient's impairments (multi-system involvement);For patient/therapist safety   OT goals addressed during session: ADL's and self-care;Strengthening/ROM      End of Session Equipment Utilized During Treatment: Gait belt;Rolling walker   Activity Tolerance Patient tolerated treatment well (despite pain)   Patient Left in chair;with call bell/phone within reach;with chair alarm set   Nurse Communication Mobility status (nurse and NT. NT requested we leave transfer board in room in case they needed it to get him back (she, Dorisann Frames reports she knows how to use it))        Time: 1210-1247 OT Time Calculation (min): 37 min  Charges: OT General Charges $OT Visit: 1 Procedure OT Treatments $Self Care/Home Management : 8-22 mins  Evette Georges [phone] 07/27/2015, 1:06 PM

## 2015-07-27 NOTE — Progress Notes (Signed)
S: Still CO pain Rt hip and spasm Rt leg O:BP 118/64 mmHg  Pulse 102  Temp(Src) 98.1 F (36.7 C) (Oral)  Resp 17  Ht  (1.753 m)  Wt 72.2 kg (159 lb 2.8 oz)  BMI 23.49 kg/m2  SpO2 96%  Intake/Output Summary (Last 24 hours) at 07/27/15 1129 Last data filed at 07/27/15 0900  Gross per 24 hour  Intake   1985 ml  Output   2650 ml  Net   -665 ml   Weight change:  Gen: Awake and alert CVS:RRR Resp:clear Abd:+ BS NTND Ext: No edema.  Multiple scabs on LE NEURO: CNI Ox3, no asterixis   . enoxaparin (LOVENOX) injection  40 mg Subcutaneous Q24H  . feeding supplement (ENSURE ENLIVE)  237 mL Oral TID BM  . HYDROcodone-acetaminophen  1-2 tablet Oral 6 times per day  . insulin aspart  0-9 Units Subcutaneous TID WC  . insulin aspart  3 Units Subcutaneous TID WC  . methocarbamol  500 mg Oral QID  . mupirocin ointment   Nasal BID  . senna-docusate  1 tablet Oral BID  . tamsulosin  0.4 mg Oral Daily   No results found. BMET    Component Value Date/Time   NA 127* 07/27/2015 0550   K 3.9 07/27/2015 0550   CL 93* 07/27/2015 0550   CO2 23 07/27/2015 0550   GLUCOSE 132* 07/27/2015 0550   BUN <5* 07/27/2015 0550   CREATININE 0.53* 07/27/2015 0550   CALCIUM 8.4* 07/27/2015 0550   GFRNONAA >60 07/27/2015 0550   GFRAA >60 07/27/2015 0550   CBC    Component Value Date/Time   WBC 7.8 07/24/2015 0620   RBC 3.07* 07/24/2015 0620   HGB 8.6* 07/24/2015 0620   HCT 25.0* 07/24/2015 0620   PLT 375 07/24/2015 0620   MCV 81.4 07/24/2015 0620   MCH 28.0 07/24/2015 0620   MCHC 34.4 07/24/2015 0620   RDW 13.3 07/24/2015 0620   LYMPHSABS 1.1 07/22/2015 0713   MONOABS 1.1* 07/22/2015 0713   EOSABS 0.1 07/22/2015 0713   BASOSABS 0.0 07/22/2015 0713     Assessment:  1. Hyponatremia, SNa slowly trending up 2. Rt hip fx 3. Anemia and iron deficiency   Plan: 1.  Will Dc IV fluids after one more bag 2. Daily SNa   Jadeyn Hargett T

## 2015-07-28 LAB — CBC
HCT: 24.4 % — ABNORMAL LOW (ref 39.0–52.0)
HEMOGLOBIN: 7.9 g/dL — AB (ref 13.0–17.0)
MCH: 26.2 pg (ref 26.0–34.0)
MCHC: 32.4 g/dL (ref 30.0–36.0)
MCV: 80.8 fL (ref 78.0–100.0)
PLATELETS: 465 10*3/uL — AB (ref 150–400)
RBC: 3.02 MIL/uL — ABNORMAL LOW (ref 4.22–5.81)
RDW: 13.5 % (ref 11.5–15.5)
WBC: 6.4 10*3/uL (ref 4.0–10.5)

## 2015-07-28 LAB — BASIC METABOLIC PANEL
ANION GAP: 10 (ref 5–15)
CALCIUM: 8.9 mg/dL (ref 8.9–10.3)
CO2: 25 mmol/L (ref 22–32)
CREATININE: 0.5 mg/dL — AB (ref 0.61–1.24)
Chloride: 98 mmol/L — ABNORMAL LOW (ref 101–111)
GFR calc Af Amer: >60 mL/min (ref >60)
GLUCOSE: 113 mg/dL — AB (ref 65–99)
Potassium: 3.6 mmol/L (ref 3.5–5.1)
Sodium: 133 mmol/L — ABNORMAL LOW (ref 135–145)

## 2015-07-28 LAB — GLUCOSE, CAPILLARY: GLUCOSE-CAPILLARY: 108 mg/dL — AB (ref 65–99)

## 2015-07-28 MED ORDER — OXYCODONE-ACETAMINOPHEN 5-325 MG PO TABS: 1.0000 | ORAL_TABLET | ORAL | Status: DC | PRN

## 2015-07-28 MED ORDER — METHOCARBAMOL 500 MG PO TABS: 500.0000 mg | ORAL_TABLET | Freq: Four times a day (QID) | ORAL | Status: DC

## 2015-07-28 NOTE — Progress Notes (Addendum)
Jeff Ferrell to be D/C'd  per MD order to [facility] via [place].  Report called and given to Scherry Ran, RN at [facility].    IV catheter discontinued. Site without signs and symptoms of complications. Dressing and pressure applied.  An After Visit Summary and prescriptions was printed and given to transport to take to facility.

## 2015-07-28 NOTE — Clinical Social Work Note (Signed)
Clinical Social Worker facilitated patient discharge including contacting patient and facility to confirm patient discharge plans.  Clinical information faxed to facility and patient agreeable with plan.  Patient to notify family upon his arrival to facility.  CSW arranged ambulance transport via [facility] to [place].  RN to call report prior to discharge.  Clinical Social Worker will sign off for now as social work intervention is no longer needed. Please consult [place] again if new need arises.  Macario Golds, [place] [phone]

## 2015-07-28 NOTE — Progress Notes (Signed)
S: Still CO pain Rt hip and spasm Rt leg.  Eating well. O:BP 122/68 mmHg  Pulse 97  Temp(Src) 98 F (36.7 C) (Oral)  Resp 18  Ht  (1.753 m)  Wt 72.2 kg (159 lb 2.8 oz)  BMI 23.49 kg/m2  SpO2 96%  Intake/Output Summary (Last 24 hours) at 07/28/15 0806 Last data filed at 07/27/15 1850  Gross per 24 hour  Intake 1466.25 ml  Output   1600 ml  Net -133.75 ml   Weight change:  Gen: Awake and alert CVS:RRR Resp:clear Abd:+ BS NTND Ext: No edema.  Multiple scabs on LE NEURO: CNI Ox3, no asterixis   . baclofen  5 mg Oral BID  . enoxaparin (LOVENOX) injection  40 mg Subcutaneous Q24H  . feeding supplement (ENSURE ENLIVE)  237 mL Oral TID BM  . HYDROcodone-acetaminophen  1-2 tablet Oral 6 times per day  . insulin aspart  0-9 Units Subcutaneous TID WC  . insulin aspart  3 Units Subcutaneous TID WC  . methocarbamol  500 mg Oral QID  . mupirocin ointment   Nasal BID  . senna-docusate  1 tablet Oral BID  . tamsulosin  0.4 mg Oral Daily   No results found. BMET    Component Value Date/Time   NA 133* 07/28/2015 0519   K 3.6 07/28/2015 0519   CL 98* 07/28/2015 0519   CO2 25 07/28/2015 0519   GLUCOSE 113* 07/28/2015 0519   BUN <5* 07/28/2015 0519   CREATININE 0.50* 07/28/2015 0519   CALCIUM 8.9 07/28/2015 0519   GFRNONAA >60 07/28/2015 0519   GFRAA >60 07/28/2015 0519   CBC    Component Value Date/Time   WBC 6.4 07/28/2015 0519   RBC 3.02* 07/28/2015 0519   HGB 7.9* 07/28/2015 0519   HCT 24.4* 07/28/2015 0519   PLT 465* 07/28/2015 0519   MCV 80.8 07/28/2015 0519   MCH 26.2 07/28/2015 0519   MCHC 32.4 07/28/2015 0519   RDW 13.5 07/28/2015 0519   LYMPHSABS 1.1 07/22/2015 0713   MONOABS 1.1* 07/22/2015 0713   EOSABS 0.1 07/22/2015 0713   BASOSABS 0.0 07/22/2015 0713     Assessment:  1. Hyponatremia, SNa close to his baseline 2. Rt hip fx 3. Anemia and iron deficiency   Plan: 1.  Will sign off, call if further issues  Jeff Ferrell T

## 2015-07-28 NOTE — Discharge Summary (Addendum)
Physician Discharge Summary  Jeff Ferrell ZDG:[id number] DOB: April 02, 1951 DOA: 07/18/2015  PCP: [name], MD  Admit date: 07/18/2015 Discharge date: 07/28/2015  Time spent: 40 minutes  Recommendations for Outpatient Follow-up:  1. Patient will need outpatient follow-up with orthopedic surgeon Dr. Roda Shutters as per his instructions 2. Patient will need to be discharged to skilled facility for further pain management heart prescription given for pain medications which be scheduled given his severe amount of pain with bilateral hip fractures 3. Recommend complete blood count as well as basic metabolic panel as an outpatient  4. Patient will need basic metabolic panel in 1-2 days for moderate to severe hyponatremia 5. Patient will need clamping and voiding trials of catheter and may need referral to urology as an outpatient if he is not able to withdraw the catheter otherwise this catheter will need to be changed every 21 days  Discharge Diagnoses:  Principal Problem:   Fracture of femoral neck, right, closed Active Problems:   Diabetes mellitus (HCC)   Femoral neck fracture, right, closed, initial encounter   Protein-calorie malnutrition, severe   Discharge Condition: Improved  Diet recommendation: Heart healthy diabetic diet  Filed Weights   07/19/15 0131  Weight: 72.2 kg (159 lb 2.8 oz)    History of present illness:   65 y/o ? Diabetes mellitus type 2,  Sinus tachycardia with grade 1 diastolic dysfunction EF 65-70% Chronic hyponatremia prior osteomyelitis Hip fracture 06/11/15 with repair 12/30. Patient readmitted 07/02/15, orthopedics consulted and noted superficial infection with scant drainage-transitioned from vancomycin to oral medications and only walk with negative staph grew Patient readmitted on 07/21/15 with right-sided hip fracture at this time Found to have progressively worsening hyponatremia and Nephrology consulted formally 2.5.17  Hospital Course:   1. Right  hip fracture- per discretion Ortho services-appreciate their input into  Anticoagulation post-op-lovenox--this should continue until 08/11/15  Weight bearing As tolerated per Ortho Pain management--I have increased his medications and scripts have been given on discharge  Follow-up services required 2. Acute blood loss anemia-expected. Hemoglobin 9--->8.6 recheck in am. 3. Diabetes mellitus type 2-prior to admission usually on Januvia 50 daily and not on sliding scale. cbg's 115-132.  4. Acute-on-Chronic euvolemic hyponatremia- 2/2 to ABLA + polydipsia. Measured Serum OSM 254, Uosm 516 and urine sodium 129. 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 continue Low dose Iv saline 75 cc/h as per Nephrology. Sodium 120-123-->127-->133 on day of discharge.would recheck in 2-3 days 5.  Compensated  heart failure grade 1 diastolic dysfunction-hold medications for now 6. Acute urinary retention-probably from narcotics-add flomax. Clamp foley and unclamp if sensation to void 7. Severe malnutrition feeding supplements to be given 8. BPH continue Flomax 0.4    Procedures: R hip # s/p IMplant  Consultants: 2. Orthopedics Dr. Deno Etienne  Procedures: 9. None yet  Antibiotics:  None   Discharge Exam: Filed Vitals:   07/27/15 2029 07/28/15 0459  BP: 117/67 122/68  Pulse: 103 97  Temp: 98.9 F (37.2 C) 98 F (36.7 C)  Resp: 19 18    General: eomi ncat in some p[ain but overall some improvement Cardiovascular: s1 s2 no m/r/g Respiratory: clear no added sound  Discharge Instructions   Discharge Instructions    Diet - low sodium heart healthy    Complete by:  As directed      Increase activity slowly    Complete by:  As directed      Weight bearing as tolerated    Complete by:  As directed  Current Discharge Medication List    START taking these medications   Details  oxyCODONE-acetaminophen (PERCOCET) 5-325 MG tablet Take 1-2 tablets by mouth every 4  (four) hours as needed for severe pain. Qty: 90 tablet, Refills: 0      CONTINUE these medications which have CHANGED   Details  enoxaparin (LOVENOX) 40 MG/0.4ML injection Inject 0.4 mLs (40 mg total) into the skin daily. Qty: 14 Syringe, Refills: 0    methocarbamol (ROBAXIN) 500 MG tablet Take 1 tablet (500 mg total) by mouth 4 (four) times daily. Qty: 90 tablet, Refills: 0      CONTINUE these medications which have NOT CHANGED   Details  acetaminophen (TYLENOL) 325 MG tablet Take 2 tablets (650 mg total) by mouth every 6 (six) hours as needed for mild pain (or Fever >/= 101).    bisacodyl (DULCOLAX) 10 MG suppository Place 10 mg rectally once.    docusate sodium (COLACE) 100 MG capsule Take 1 capsule (100 mg total) by mouth 2 (two) times daily. Qty: 60 capsule, Refills: 0    magnesium hydroxide (MILK OF MAGNESIA) 400 MG/5ML suspension Take 30 mLs by mouth daily as needed for mild constipation.    oxyCODONE (OXY IR/ROXICODONE) 5 MG immediate release tablet Take 1-3 tablets (5-15 mg total) by mouth every 4 (four) hours as needed. Qty: 30 tablet, Refills: 0    oxyCODONE-acetaminophen (PERCOCET) 7.5-325 MG tablet Take 1 tablet by mouth 3 (three) times daily. Qty: 30 tablet, Refills: 0    polyethylene glycol (MIRALAX / GLYCOLAX) packet Take 17 g by mouth 2 (two) times daily. Qty: 60 each, Refills: 0    sennosides-docusate sodium (SENOKOT-S) 8.6-50 MG tablet Take 1 tablet by mouth 2 (two) times daily.    sitaGLIPtin (JANUVIA) 50 MG tablet Take 1 tablet (50 mg total) by mouth daily. Qty: 30 tablet, Refills: 1    tamsulosin (FLOMAX) 0.4 MG CAPS capsule Take 1 capsule (0.4 mg total) by mouth daily. Qty: 30 capsule, Refills: 1      STOP taking these medications     sodium phosphate (FLEET) enema      sulfamethoxazole-trimethoprim (BACTRIM DS,SEPTRA DS) 800-160 MG tablet        Allergies  Allergen Reactions  . Penicillins Hives and Rash    Has patient had a PCN reaction  causing immediate rash, facial/tongue/throat swelling, SOB or lightheadedness with hypotension: Yes Has patient had a PCN reaction causing severe rash involving mucus membranes or skin necrosis: Yes   Has patient had a PCN reaction that required hospitalization No Has patient had a PCN reaction occurring within the last 10 years: No If all of the above answers are "NO", then may proceed with Cephalosporin use.    Follow-up Information    Follow up with Cheral Almas, MD In 2 weeks.   Specialty:  Orthopedic Surgery   Why:  For suture removal, For wound re-check   Contact information:   [address] [place] [address] [phone]        The results of significant diagnostics from this hospitalization (including imaging, microbiology, ancillary and laboratory) are listed below for reference.    Significant Diagnostic Studies: Mr Lumbar Spine W Wo Contrast  07/01/2015  CLINICAL DATA:  Initial evaluation for low back pain. EXAM: MRI LUMBAR SPINE WITHOUT AND WITH CONTRAST TECHNIQUE: Multiplanar and multiecho pulse sequences of the lumbar spine were obtained without and with intravenous contrast. CONTRAST:  18mL MULTIHANCE GADOBENATE DIMEGLUMINE 529 MG/ML IV SOLN COMPARISON:  Prior MRI  from 06/11/2015. FINDINGS: Study is moderately degraded by motion artifact. Alignment is stable. Severe height loss at the L5 vertebral body again seen. Bone marrow signal intensity within normal limits, and unchanged. Posterior lumbar fusion from L2 through S1 again seen. Severe L4-5 disc space narrowing, unchanged. Increased T2 signal intensity within the L5-S1 disc space is stable from previous. Mild disc enhancement also likely unchanged, although not as well evaluated on this motion degraded study. No associated paraspinous soft tissue inflammatory changes. No definite fluid collection Conus medullaris terminates normally at the T12 level. Signal intensity within the visualized cord is normal.  No acute paraspinous soft tissue abnormality. Marked urinary bladder distention noted, similar to previous. T12-L1:  Normal intervertebral disc.  No stenosis. L1-2: Degenerative disc bulge with disc desiccation. No focal disc herniation. Prominent epidural fat at the dorsal aspect of the thecal sac. Moderate bilateral facet arthrosis. Resultant moderate canal narrowing is stable. Mild bilateral foraminal narrowing also unchanged. L2-3: No significant disc bulge or focal disc protrusion. Status post decompression. No significant stenosis. L3-4: Somewhat limited evaluation due to susceptibility artifact. Probable mild disc bulge with disc desiccation. No significant canal stenosis. Foramina appear patent bilaterally. L4-5: Severe degenerative intervertebral disc space narrowing with disc desiccation and endplate changes, stable. No significant canal or foraminal stenosis. L5-S1: Fluid signal intensity within the L5-S1 disc, which is somewhat wide and anteriorly. Mild discal enhancement. This is stable relative to previous. No progressive endplate osseous erosion. Mild bilateral foraminal narrowing, worse on the right.No central canal stenosis. IMPRESSION: 1. Stable appearance of the lumbar spine. 2. Increased T2 signal intensity within the L5-S1 disc with mild discal enhancement. Given the relative stability of this finding and absence of adjacent paraspinous inflammatory changes, postsurgical changes are favored, although again, possible infection is not entirely excluded. 3. Stable posterior lumbar fusion from L2 through S1. 4. Severe disc height loss at L4-5 with L5 vertebral body height loss. 5. Marked urinary bladder distension, similar to previous. Electronically Signed   By: Rise Mu M.D.   On: 07/01/2015 23:41   Ct Hip Right Wo Contrast  07/20/2015  CLINICAL DATA:  Right hip fracture. EXAM: CT OF THE RIGHT HIP WITHOUT CONTRAST TECHNIQUE: Multidetector CT imaging of the right hip was performed  according to the standard protocol. Multiplanar CT image reconstructions were also generated. COMPARISON:  None. FINDINGS: Impacted, displaced and angulated right basicervical right femoral neck fracture. Comminuted fracture also involving the right greater trochanter. No extension of the fracture cleft to the lesser trochanter. The visualized right superior and inferior pubic rami are intact. There is no aggressive lytic or sclerotic osseous lesion. 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 The muscles are normal. There is no fluid collection or hematoma. There is peripheral vascular atherosclerotic disease. IMPRESSION: 1. Impacted, displaced and angulated right basicervical right femoral neck fracture. Comminuted fracture also involving the right greater trochanter. No extension of the fracture cleft to the lesser trochanter. Electronically Signed   By: Elige Ko   On: 07/20/2015 09:33   Dg Chest Port 1 View  07/02/2015  CLINICAL DATA:  Left chest wall pain. EXAM: PORTABLE CHEST 1 VIEW COMPARISON:  None. FINDINGS: The heart is normal in size. Mild tortuosity of the thoracic aorta. Question vascular congestion versus vascular crowding secondary to low lung volumes. a No consolidation, pleural effusion, or pneumothorax. Minimally displaced fractures of left posterior fourth, fifth, and sixth ribs. IMPRESSION: Minimally displaced fractures of left posterior fourth, fifth, and sixth ribs, age indeterminate. No  pulmonary complication. Electronically Signed   By: Rubye Oaks M.D.   On: 07/02/2015 02:40   Dg C-arm 1-60 Min  07/21/2015  CLINICAL DATA:  65 year old male status post intra medullary nail placement. EXAM: DG C-ARM 61-120 MIN; RIGHT FEMUR 2 VIEWS COMPARISON:  Right hip radiograph 07/18/2015. FINDINGS: Two intraoperative fluoroscopic spot views of the right hip document interval placement of a gamma nail fixation device traversing the previously noted hip fracture. Restoration of anatomic  alignment has been achieved. Right femoral head is properly located. No acute complicating features. IMPRESSION: 1. Postoperative changes of ORIF in the right hip, as above. Electronically Signed   By: Trudie Reed M.D.   On: 07/21/2015 17:03   Dg Hip Unilat With Pelvis 2-3 Views Right  07/18/2015  CLINICAL DATA:  Right hip pain since Thursday.  Initial encounter. EXAM: DG HIP (WITH OR WITHOUT PELVIS) 2-3V RIGHT COMPARISON:  07/01/2015 FINDINGS: Acute basicervical right femoral neck fracture which also involves the greater trochanter. No intertrochanteric extension identified. There is varus angulation at the neck fracture from displacement. No dislocation. Recent proximal left femur fracture with progressive varus angulation especially when compared to operative fluoroscopy. Lumbosacral spine fixation with decompressive laminectomies. IMPRESSION: 1. Displaced basicervical right femoral neck fracture. Right greater trochanter fracture. 2. Recent left femoral neck fracture with progressive varus angulation. Electronically Signed   By: Marnee Spring M.D.   On: 07/18/2015 23:44   Dg Femur, Min 2 Views Right  07/21/2015  CLINICAL DATA:  65 year old male status post intra medullary nail placement. EXAM: DG C-ARM 61-120 MIN; RIGHT FEMUR 2 VIEWS COMPARISON:  Right hip radiograph 07/18/2015. FINDINGS: Two intraoperative fluoroscopic spot views of the right hip document interval placement of a gamma nail fixation device traversing the previously noted hip fracture. Restoration of anatomic alignment has been achieved. Right femoral head is properly located. No acute complicating features. IMPRESSION: 1. Postoperative changes of ORIF in the right hip, as above. Electronically Signed   By: Trudie Reed M.D.   On: 07/21/2015 17:03   Dg Hips Bilat With Pelvis 3-4 Views  07/01/2015  CLINICAL DATA:  Left and right hip pain. Left hip surgery 3 weeks prior. EXAM: DG HIP (WITH OR WITHOUT PELVIS) 3-4V BILAT  COMPARISON:  Pelvis and hip MRI 06/16/2015 FINDINGS: Intra medullary rod with trans trochanteric screws traverse the left proximal femur, low femoral neck fracture not well seen radiographically. No new fracture or periprosthetic lucency. Distal aspect of the intra medullary rod not included in the field of view. Skin staples laterally and anteriorly at the surgical site with probable soft tissue edema. Pubic rami and pubic symphysis are intact. Right femoral head is seated in the acetabulum, unchanged from prior MRI. Lumbosacral hardware again seen. IMPRESSION: 1. Recent intra medullary rods and screw fixation of proximal left femur fracture. No postoperative complication. Skin staples remain in place with probable soft tissue edema. 2. No acute abnormality of the right hip or bony pelvis. Postsurgical change in the lower lumbar spine and pelvis. Electronically Signed   By: Rubye Oaks M.D.   On: 07/01/2015 19:19    Microbiology: Recent Results (from the past 240 hour(s))  Surgical pcr screen     Status: Abnormal   Collection Time: 07/19/15  8:55 AM  Result Value Ref Range Status   MRSA, PCR POSITIVE (A) NEGATIVE Final    Comment: RESULT CALLED TO, READ BACK BY AND VERIFIED WITH: AMY FARGO,RN [id number] @ 1417 BY J SCOTTON    Staphylococcus aureus POSITIVE (A)  NEGATIVE Final    Comment:        The Xpert SA Assay (FDA approved for NASAL specimens in patients over 36 years of age), is one component of a comprehensive surveillance program.  Test performance has been validated by [facility] for patients greater than or equal to 21 year old. It is not intended to diagnose infection nor to guide or monitor treatment. RESULT CALLED TO, READ BACK BY AND VERIFIED WITH: AMY FARGO,RN [id number] @ 1417 BY J SCOTTON      Labs: Basic Metabolic Panel:  Recent Labs Lab 07/25/15 0601 07/25/15 1359 07/26/15 0507 07/27/15 0550 07/28/15 0519  NA 123* 120* 123* 127* 133*  K 4.1 3.6 3.8 3.9 3.6   CL 88* 84* 89* 93* 98*  CO2 23 25 25 23 25   GLUCOSE 155* 144* 125* 132* 113*  BUN 7 6 <5* <5* <5*  CREATININE 0.56* 0.55* 0.58* 0.53* 0.50*  CALCIUM 8.3* 8.4* 8.5* 8.4* 8.9  PHOS  --   --  3.3  --   --    Liver Function Tests:  Recent Labs Lab 07/25/15 1753 07/26/15 0507  ALBUMIN 2.6* 2.5*   No results for input(s): LIPASE, AMYLASE in the last 168 hours. No results for input(s): AMMONIA in the last 168 hours. CBC:  Recent Labs Lab 07/21/15 1827 07/22/15 0713 07/23/15 0855 07/24/15 0620 07/28/15 0519  WBC 7.5 8.0 8.2 7.8 6.4  NEUTROABS  --  5.6  --   --   --   HGB 11.1* 9.1* 8.7* 8.6* 7.9*  HCT 32.6* 26.5* 25.9* 25.0* 24.4*  MCV 82.3 82.6 81.7 81.4 80.8  PLT 337 355 350 375 465*   Cardiac Enzymes: No results for input(s): CKTOTAL, CKMB, CKMBINDEX, TROPONINI in the last 168 hours. BNP: BNP (last 3 results)  Recent Labs  06/11/15 1106 07/02/15 0538  BNP 27.7 34.0    ProBNP (last 3 results) No results for input(s): PROBNP in the last 8760 hours.  CBG:  Recent Labs Lab 07/27/15 0731 07/27/15 1147 07/27/15 1730 07/27/15 2118 07/28/15 0810  GLUCAP 115* 125* 246* 118* 108*       Signed:  Rhetta Mura MD   [facility] 07/28/2015, 10:08 AM

## 2015-07-29 LAB — GLUCOSE, CAPILLARY: GLUCOSE-CAPILLARY: 120 mg/dL — AB (ref 65–99)

## 2015-08-07 ENCOUNTER — Encounter (HOSPITAL_COMMUNITY): Payer: Self-pay | Admitting: Emergency Medicine

## 2015-08-07 ENCOUNTER — Inpatient Hospital Stay (HOSPITAL_COMMUNITY): Payer: Medicare Other

## 2015-08-07 ENCOUNTER — Emergency Department (HOSPITAL_COMMUNITY): Payer: Medicare Other

## 2015-08-07 ENCOUNTER — Inpatient Hospital Stay (HOSPITAL_COMMUNITY)
Admission: EM | Admit: 2015-08-07 | Discharge: 2015-09-03 | DRG: 856 | Disposition: A | Discharge status: Home Health | Payer: Medicare Other | Attending: Internal Medicine | Admitting: Internal Medicine

## 2015-08-07 DIAGNOSIS — R338 Other retention of urine: Secondary | ICD-10-CM | POA: Diagnosis not present

## 2015-08-07 DIAGNOSIS — M545 Low back pain, unspecified: Secondary | ICD-10-CM | POA: Diagnosis present

## 2015-08-07 DIAGNOSIS — R64 Cachexia: Secondary | ICD-10-CM | POA: Diagnosis present

## 2015-08-07 DIAGNOSIS — S72001C Fracture of unspecified part of neck of right femur, initial encounter for open fracture type IIIA, IIIB, or IIIC: Secondary | ICD-10-CM | POA: Diagnosis not present

## 2015-08-07 DIAGNOSIS — J189 Pneumonia, unspecified organism: Secondary | ICD-10-CM | POA: Diagnosis not present

## 2015-08-07 DIAGNOSIS — R7881 Bacteremia: Secondary | ICD-10-CM | POA: Diagnosis not present

## 2015-08-07 DIAGNOSIS — Z515 Encounter for palliative care: Secondary | ICD-10-CM | POA: Diagnosis not present

## 2015-08-07 DIAGNOSIS — R509 Fever, unspecified: Secondary | ICD-10-CM | POA: Diagnosis not present

## 2015-08-07 DIAGNOSIS — M4802 Spinal stenosis, cervical region: Secondary | ICD-10-CM | POA: Diagnosis present

## 2015-08-07 DIAGNOSIS — S71001A Unspecified open wound, right hip, initial encounter: Secondary | ICD-10-CM | POA: Diagnosis not present

## 2015-08-07 DIAGNOSIS — M4804 Spinal stenosis, thoracic region: Secondary | ICD-10-CM | POA: Diagnosis present

## 2015-08-07 DIAGNOSIS — IMO0002 Reserved for concepts with insufficient information to code with codable children: Secondary | ICD-10-CM | POA: Diagnosis present

## 2015-08-07 DIAGNOSIS — R451 Restlessness and agitation: Secondary | ICD-10-CM | POA: Diagnosis not present

## 2015-08-07 DIAGNOSIS — Z789 Other specified health status: Secondary | ICD-10-CM | POA: Diagnosis not present

## 2015-08-07 DIAGNOSIS — G9340 Encephalopathy, unspecified: Secondary | ICD-10-CM | POA: Diagnosis present

## 2015-08-07 DIAGNOSIS — S7292XA Unspecified fracture of left femur, initial encounter for closed fracture: Secondary | ICD-10-CM | POA: Diagnosis present

## 2015-08-07 DIAGNOSIS — Y838 Other surgical procedures as the cause of abnormal reaction of the patient, or of later complication, without mention of misadventure at the time of the procedure: Secondary | ICD-10-CM | POA: Diagnosis present

## 2015-08-07 DIAGNOSIS — F11988 Opioid use, unspecified with other opioid-induced disorder: Secondary | ICD-10-CM | POA: Diagnosis not present

## 2015-08-07 DIAGNOSIS — M869 Osteomyelitis, unspecified: Secondary | ICD-10-CM | POA: Diagnosis not present

## 2015-08-07 DIAGNOSIS — S72001E Fracture of unspecified part of neck of right femur, subsequent encounter for open fracture type I or II with routine healing: Secondary | ICD-10-CM | POA: Diagnosis not present

## 2015-08-07 DIAGNOSIS — T814XXA Infection following a procedure, initial encounter: Principal | ICD-10-CM | POA: Diagnosis present

## 2015-08-07 DIAGNOSIS — D638 Anemia in other chronic diseases classified elsewhere: Secondary | ICD-10-CM | POA: Diagnosis present

## 2015-08-07 DIAGNOSIS — I82B11 Acute embolism and thrombosis of right subclavian vein: Secondary | ICD-10-CM | POA: Diagnosis present

## 2015-08-07 DIAGNOSIS — Z880 Allergy status to penicillin: Secondary | ICD-10-CM | POA: Diagnosis not present

## 2015-08-07 DIAGNOSIS — R000 Tachycardia, unspecified: Secondary | ICD-10-CM

## 2015-08-07 DIAGNOSIS — A4102 Sepsis due to Methicillin resistant Staphylococcus aureus: Secondary | ICD-10-CM | POA: Diagnosis present

## 2015-08-07 DIAGNOSIS — E13621 Other specified diabetes mellitus with foot ulcer: Secondary | ICD-10-CM

## 2015-08-07 DIAGNOSIS — K5900 Constipation, unspecified: Secondary | ICD-10-CM | POA: Diagnosis present

## 2015-08-07 DIAGNOSIS — N390 Urinary tract infection, site not specified: Secondary | ICD-10-CM

## 2015-08-07 DIAGNOSIS — F1010 Alcohol abuse, uncomplicated: Secondary | ICD-10-CM | POA: Diagnosis present

## 2015-08-07 DIAGNOSIS — E222 Syndrome of inappropriate secretion of antidiuretic hormone: Secondary | ICD-10-CM | POA: Diagnosis present

## 2015-08-07 DIAGNOSIS — N401 Enlarged prostate with lower urinary tract symptoms: Secondary | ICD-10-CM | POA: Diagnosis present

## 2015-08-07 DIAGNOSIS — E1165 Type 2 diabetes mellitus with hyperglycemia: Secondary | ICD-10-CM | POA: Diagnosis present

## 2015-08-07 DIAGNOSIS — E11621 Type 2 diabetes mellitus with foot ulcer: Secondary | ICD-10-CM | POA: Diagnosis present

## 2015-08-07 DIAGNOSIS — N136 Pyonephrosis: Secondary | ICD-10-CM | POA: Diagnosis present

## 2015-08-07 DIAGNOSIS — M549 Dorsalgia, unspecified: Secondary | ICD-10-CM | POA: Diagnosis not present

## 2015-08-07 DIAGNOSIS — E871 Hypo-osmolality and hyponatremia: Secondary | ICD-10-CM | POA: Diagnosis not present

## 2015-08-07 DIAGNOSIS — Z89412 Acquired absence of left great toe: Secondary | ICD-10-CM | POA: Diagnosis not present

## 2015-08-07 DIAGNOSIS — L0291 Cutaneous abscess, unspecified: Secondary | ICD-10-CM

## 2015-08-07 DIAGNOSIS — B999 Unspecified infectious disease: Secondary | ICD-10-CM | POA: Diagnosis not present

## 2015-08-07 DIAGNOSIS — T814XXD Infection following a procedure, subsequent encounter: Secondary | ICD-10-CM | POA: Diagnosis not present

## 2015-08-07 DIAGNOSIS — S72001A Fracture of unspecified part of neck of right femur, initial encounter for closed fracture: Secondary | ICD-10-CM | POA: Diagnosis present

## 2015-08-07 DIAGNOSIS — Z6823 Body mass index (BMI) 23.0-23.9, adult: Secondary | ICD-10-CM

## 2015-08-07 DIAGNOSIS — B9562 Methicillin resistant Staphylococcus aureus infection as the cause of diseases classified elsewhere: Secondary | ICD-10-CM | POA: Diagnosis not present

## 2015-08-07 DIAGNOSIS — A419 Sepsis, unspecified organism: Secondary | ICD-10-CM | POA: Insufficient documentation

## 2015-08-07 DIAGNOSIS — J90 Pleural effusion, not elsewhere classified: Secondary | ICD-10-CM | POA: Diagnosis present

## 2015-08-07 DIAGNOSIS — M8610 Other acute osteomyelitis, unspecified site: Secondary | ICD-10-CM | POA: Diagnosis present

## 2015-08-07 DIAGNOSIS — M62838 Other muscle spasm: Secondary | ICD-10-CM | POA: Diagnosis not present

## 2015-08-07 DIAGNOSIS — Z79899 Other long term (current) drug therapy: Secondary | ICD-10-CM

## 2015-08-07 DIAGNOSIS — A4101 Sepsis due to Methicillin susceptible Staphylococcus aureus: Secondary | ICD-10-CM | POA: Diagnosis present

## 2015-08-07 DIAGNOSIS — N10 Acute tubulo-interstitial nephritis: Secondary | ICD-10-CM | POA: Diagnosis not present

## 2015-08-07 DIAGNOSIS — E1169 Type 2 diabetes mellitus with other specified complication: Secondary | ICD-10-CM | POA: Diagnosis present

## 2015-08-07 DIAGNOSIS — B351 Tinea unguium: Secondary | ICD-10-CM | POA: Diagnosis present

## 2015-08-07 DIAGNOSIS — M4806 Spinal stenosis, lumbar region: Secondary | ICD-10-CM | POA: Diagnosis present

## 2015-08-07 DIAGNOSIS — R208 Other disturbances of skin sensation: Secondary | ICD-10-CM | POA: Diagnosis present

## 2015-08-07 DIAGNOSIS — R52 Pain, unspecified: Secondary | ICD-10-CM | POA: Diagnosis not present

## 2015-08-07 DIAGNOSIS — L97509 Non-pressure chronic ulcer of other part of unspecified foot with unspecified severity: Secondary | ICD-10-CM | POA: Diagnosis present

## 2015-08-07 DIAGNOSIS — E876 Hypokalemia: Secondary | ICD-10-CM | POA: Diagnosis not present

## 2015-08-07 DIAGNOSIS — S72009A Fracture of unspecified part of neck of unspecified femur, initial encounter for closed fracture: Secondary | ICD-10-CM

## 2015-08-07 DIAGNOSIS — M4646 Discitis, unspecified, lumbar region: Secondary | ICD-10-CM | POA: Diagnosis present

## 2015-08-07 DIAGNOSIS — T847XXA Infection and inflammatory reaction due to other internal orthopedic prosthetic devices, implants and grafts, initial encounter: Secondary | ICD-10-CM | POA: Diagnosis present

## 2015-08-07 DIAGNOSIS — E118 Type 2 diabetes mellitus with unspecified complications: Secondary | ICD-10-CM | POA: Diagnosis not present

## 2015-08-07 DIAGNOSIS — L89891 Pressure ulcer of other site, stage 1: Secondary | ICD-10-CM | POA: Diagnosis present

## 2015-08-07 DIAGNOSIS — R0789 Other chest pain: Secondary | ICD-10-CM | POA: Diagnosis present

## 2015-08-07 DIAGNOSIS — M5440 Lumbago with sciatica, unspecified side: Secondary | ICD-10-CM | POA: Diagnosis not present

## 2015-08-07 DIAGNOSIS — G8929 Other chronic pain: Secondary | ICD-10-CM | POA: Diagnosis present

## 2015-08-07 DIAGNOSIS — M25552 Pain in left hip: Secondary | ICD-10-CM | POA: Diagnosis present

## 2015-08-07 DIAGNOSIS — I371 Nonrheumatic pulmonary valve insufficiency: Secondary | ICD-10-CM | POA: Diagnosis not present

## 2015-08-07 DIAGNOSIS — N138 Other obstructive and reflux uropathy: Secondary | ICD-10-CM | POA: Diagnosis present

## 2015-08-07 DIAGNOSIS — L0231 Cutaneous abscess of buttock: Secondary | ICD-10-CM | POA: Diagnosis present

## 2015-08-07 DIAGNOSIS — L02213 Cutaneous abscess of chest wall: Secondary | ICD-10-CM | POA: Diagnosis not present

## 2015-08-07 DIAGNOSIS — B9689 Other specified bacterial agents as the cause of diseases classified elsewhere: Secondary | ICD-10-CM | POA: Diagnosis not present

## 2015-08-07 DIAGNOSIS — T402X5A Adverse effect of other opioids, initial encounter: Secondary | ICD-10-CM | POA: Diagnosis present

## 2015-08-07 DIAGNOSIS — E861 Hypovolemia: Secondary | ICD-10-CM | POA: Diagnosis present

## 2015-08-07 DIAGNOSIS — T8469XA Infection and inflammatory reaction due to internal fixation device of other site, initial encounter: Secondary | ICD-10-CM | POA: Diagnosis not present

## 2015-08-07 DIAGNOSIS — R222 Localized swelling, mass and lump, trunk: Secondary | ICD-10-CM | POA: Diagnosis not present

## 2015-08-07 DIAGNOSIS — E119 Type 2 diabetes mellitus without complications: Secondary | ICD-10-CM | POA: Diagnosis not present

## 2015-08-07 DIAGNOSIS — T847XXD Infection and inflammatory reaction due to other internal orthopedic prosthetic devices, implants and grafts, subsequent encounter: Secondary | ICD-10-CM | POA: Diagnosis not present

## 2015-08-07 DIAGNOSIS — R6520 Severe sepsis without septic shock: Secondary | ICD-10-CM | POA: Diagnosis not present

## 2015-08-07 LAB — URINE MICROSCOPIC-ADD ON: RBC / HPF: NONE SEEN RBC/hpf (ref 0–5)

## 2015-08-07 LAB — URINALYSIS, ROUTINE W REFLEX MICROSCOPIC
BILIRUBIN URINE: NEGATIVE
Glucose, UA: NEGATIVE mg/dL
Hgb urine dipstick: NEGATIVE
KETONES UR: NEGATIVE mg/dL
NITRITE: POSITIVE — AB
PH: 6.5 (ref 5.0–8.0)
Protein, ur: 30 mg/dL — AB
SPECIFIC GRAVITY, URINE: 1.02 (ref 1.005–1.030)

## 2015-08-07 LAB — COMPREHENSIVE METABOLIC PANEL
ALK PHOS: 244 U/L — AB (ref 38–126)
ALT: 17 U/L (ref 17–63)
ANION GAP: 12 (ref 5–15)
AST: 21 U/L (ref 15–41)
Albumin: 2.9 g/dL — ABNORMAL LOW (ref 3.5–5.0)
BILIRUBIN TOTAL: 1.6 mg/dL — AB (ref 0.3–1.2)
BUN: 13 mg/dL (ref 6–20)
CALCIUM: 9.1 mg/dL (ref 8.9–10.3)
CO2: 22 mmol/L (ref 22–32)
CREATININE: 0.76 mg/dL (ref 0.61–1.24)
Chloride: 89 mmol/L — ABNORMAL LOW (ref 101–111)
Glucose, Bld: 163 mg/dL — ABNORMAL HIGH (ref 65–99)
Potassium: 4.3 mmol/L (ref 3.5–5.1)
SODIUM: 123 mmol/L — AB (ref 135–145)
TOTAL PROTEIN: 7.6 g/dL (ref 6.5–8.1)

## 2015-08-07 LAB — CBC WITH DIFFERENTIAL/PLATELET
BASOS ABS: 0 10*3/uL (ref 0.0–0.1)
BASOS PCT: 0 %
EOS ABS: 0 10*3/uL (ref 0.0–0.7)
Eosinophils Relative: 0 %
HEMATOCRIT: 27.4 % — AB (ref 39.0–52.0)
HEMOGLOBIN: 8.9 g/dL — AB (ref 13.0–17.0)
Lymphocytes Relative: 5 %
Lymphs Abs: 0.7 10*3/uL (ref 0.7–4.0)
MCH: 25.5 pg — ABNORMAL LOW (ref 26.0–34.0)
MCHC: 32.5 g/dL (ref 30.0–36.0)
MCV: 78.5 fL (ref 78.0–100.0)
Monocytes Absolute: 1.5 10*3/uL — ABNORMAL HIGH (ref 0.1–1.0)
Monocytes Relative: 11 %
NEUTROS ABS: 11.8 10*3/uL — AB (ref 1.7–7.7)
NEUTROS PCT: 84 %
Platelets: 464 10*3/uL — ABNORMAL HIGH (ref 150–400)
RBC: 3.49 MIL/uL — ABNORMAL LOW (ref 4.22–5.81)
RDW: 14.1 % (ref 11.5–15.5)
WBC: 14 10*3/uL — AB (ref 4.0–10.5)

## 2015-08-07 LAB — I-STAT CG4 LACTIC ACID, ED
LACTIC ACID, VENOUS: 1.53 mmol/L (ref 0.5–2.0)
Lactic Acid, Venous: 1.92 mmol/L (ref 0.5–2.0)

## 2015-08-07 LAB — CBC
HCT: 25.8 % — ABNORMAL LOW (ref 39.0–52.0)
Hemoglobin: 8.4 g/dL — ABNORMAL LOW (ref 13.0–17.0)
MCH: 25.7 pg — ABNORMAL LOW (ref 26.0–34.0)
MCHC: 32.6 g/dL (ref 30.0–36.0)
MCV: 78.9 fL (ref 78.0–100.0)
PLATELETS: 453 10*3/uL — AB (ref 150–400)
RBC: 3.27 MIL/uL — AB (ref 4.22–5.81)
RDW: 14 % (ref 11.5–15.5)
WBC: 15 10*3/uL — AB (ref 4.0–10.5)

## 2015-08-07 LAB — C-REACTIVE PROTEIN: CRP: 22.8 mg/dL — ABNORMAL HIGH (ref <1.0)

## 2015-08-07 LAB — PROCALCITONIN: Procalcitonin: 1.41 ng/mL

## 2015-08-07 LAB — I-STAT CHEM 8, ED
BUN: 13 mg/dL (ref 6–20)
CALCIUM ION: 1.17 mmol/L (ref 1.13–1.30)
CHLORIDE: 90 mmol/L — AB (ref 101–111)
CREATININE: 0.6 mg/dL — AB (ref 0.61–1.24)
GLUCOSE: 159 mg/dL — AB (ref 65–99)
HEMATOCRIT: 31 % — AB (ref 39.0–52.0)
Hemoglobin: 10.5 g/dL — ABNORMAL LOW (ref 13.0–17.0)
Potassium: 4.1 mmol/L (ref 3.5–5.1)
SODIUM: 126 mmol/L — AB (ref 135–145)
TCO2: 23 mmol/L (ref 0–100)

## 2015-08-07 LAB — PROTIME-INR
INR: 1.39 (ref 0.00–1.49)
PROTHROMBIN TIME: 17.2 s — AB (ref 11.6–15.2)

## 2015-08-07 LAB — CREATININE, SERUM: CREATININE: 0.65 mg/dL (ref 0.61–1.24)

## 2015-08-07 LAB — SEDIMENTATION RATE: Sed Rate: 100 mm/hr — ABNORMAL HIGH (ref 0–16)

## 2015-08-07 LAB — CBG MONITORING, ED: GLUCOSE-CAPILLARY: 149 mg/dL — AB (ref 65–99)

## 2015-08-07 MED ORDER — SODIUM CHLORIDE 0.9 % IV BOLUS (SEPSIS)
500.0000 mL | Freq: Once | INTRAVENOUS | Status: AC
  Administered 2015-08-07: 500 mL via INTRAVENOUS

## 2015-08-07 MED ORDER — SENNOSIDES-DOCUSATE SODIUM 8.6-50 MG PO TABS
1.0000 | ORAL_TABLET | Freq: Every day | ORAL | Status: DC
  Filled 2015-08-07: qty 1

## 2015-08-07 MED ORDER — VANCOMYCIN HCL IN DEXTROSE 1-5 GM/200ML-% IV SOLN
1000.0000 mg | Freq: Two times a day (BID) | INTRAVENOUS | Status: DC
  Administered 2015-08-08 – 2015-08-10 (×5): 1000 mg via INTRAVENOUS
  Filled 2015-08-07 (×5): qty 200

## 2015-08-07 MED ORDER — ALUM & MAG HYDROXIDE-SIMETH 200-200-20 MG/5ML PO SUSP: 30.0000 mL | Freq: Four times a day (QID) | ORAL | Status: DC | PRN

## 2015-08-07 MED ORDER — DEXTROSE 5 % IV SOLN
2.0000 g | Freq: Once | INTRAVENOUS | Status: AC
  Administered 2015-08-07: 2 g via INTRAVENOUS
  Filled 2015-08-07: qty 2

## 2015-08-07 MED ORDER — ACETAMINOPHEN 325 MG PO TABS
650.0000 mg | ORAL_TABLET | Freq: Four times a day (QID) | ORAL | Status: DC | PRN
  Administered 2015-08-08: 650 mg via ORAL

## 2015-08-07 MED ORDER — ONDANSETRON HCL 4 MG/2ML IJ SOLN
4.0000 mg | Freq: Four times a day (QID) | INTRAMUSCULAR | Status: DC | PRN
  Administered 2015-08-07: 4 mg via INTRAVENOUS
  Filled 2015-08-07: qty 2

## 2015-08-07 MED ORDER — SODIUM CHLORIDE 0.9 % IV BOLUS (SEPSIS)
1000.0000 mL | INTRAVENOUS | Status: AC
  Administered 2015-08-07 (×2): 1000 mL via INTRAVENOUS

## 2015-08-07 MED ORDER — DEXTROSE 5 % IV SOLN
2.0000 g | Freq: Three times a day (TID) | INTRAVENOUS | Status: DC
  Administered 2015-08-07 – 2015-08-08 (×2): 2 g via INTRAVENOUS
  Filled 2015-08-07 (×4): qty 2

## 2015-08-07 MED ORDER — VANCOMYCIN HCL 10 G IV SOLR
1500.0000 mg | Freq: Once | INTRAVENOUS | Status: AC
  Administered 2015-08-07: 1500 mg via INTRAVENOUS
  Filled 2015-08-07: qty 1500

## 2015-08-07 MED ORDER — ACETAMINOPHEN 650 MG RE SUPP: 650.0000 mg | Freq: Four times a day (QID) | RECTAL | Status: DC | PRN

## 2015-08-07 MED ORDER — SODIUM CHLORIDE 0.9 % IV SOLN
INTRAVENOUS | Status: DC
  Administered 2015-08-07 – 2015-08-13 (×7): via INTRAVENOUS
  Administered 2015-08-16: 100 mL/h via INTRAVENOUS
  Administered 2015-08-17 – 2015-09-01 (×14): via INTRAVENOUS
  Administered 2015-09-01: 100 mL/h via INTRAVENOUS
  Administered 2015-09-02: 01:00:00 via INTRAVENOUS

## 2015-08-07 MED ORDER — ONDANSETRON HCL 4 MG PO TABS: 4.0000 mg | ORAL_TABLET | Freq: Four times a day (QID) | ORAL | Status: DC | PRN

## 2015-08-07 MED ORDER — HYDROCODONE-ACETAMINOPHEN 5-325 MG PO TABS
1.0000 | ORAL_TABLET | ORAL | Status: DC | PRN
  Administered 2015-08-07 – 2015-08-09 (×5): 2 via ORAL
  Filled 2015-08-07 (×6): qty 2

## 2015-08-07 MED ORDER — HEPARIN SODIUM (PORCINE) 5000 UNIT/ML IJ SOLN
5000.0000 [IU] | Freq: Three times a day (TID) | INTRAMUSCULAR | Status: DC
  Administered 2015-08-07 (×2): 5000 [IU] via SUBCUTANEOUS
  Filled 2015-08-07: qty 1

## 2015-08-07 MED ORDER — SODIUM CHLORIDE 0.9 % IV BOLUS (SEPSIS)
500.0000 mL | INTRAVENOUS | Status: AC
  Administered 2015-08-07: 500 mL via INTRAVENOUS

## 2015-08-07 MED ORDER — VANCOMYCIN HCL IN DEXTROSE 1-5 GM/200ML-% IV SOLN: 1000.0000 mg | Freq: Two times a day (BID) | INTRAVENOUS | Status: DC

## 2015-08-07 MED ORDER — MORPHINE SULFATE (PF) 2 MG/ML IV SOLN
2.0000 mg | INTRAVENOUS | Status: DC | PRN
  Administered 2015-08-07 – 2015-08-09 (×3): 2 mg via INTRAVENOUS
  Filled 2015-08-07 (×3): qty 1

## 2015-08-07 MED ORDER — ENSURE ENLIVE PO LIQD: 237.0000 mL | Freq: Three times a day (TID) | ORAL | Status: DC

## 2015-08-07 NOTE — ED Notes (Signed)
Dean MD- ortho called states will do incision and drainage tomorrow morning due to patient eating at 4pm today. Patient made aware.

## 2015-08-07 NOTE — ED Notes (Signed)
Patient spilled urine all over bed and floor by accident. Urine soaked bed as well as 4 towels. Patient cleaned up and provided pericare.

## 2015-08-07 NOTE — Consult Note (Addendum)
Reason for Consult: Right hip infection Referring Physician: Drs. Ament  Jeff Ferrell is an 65 y.o. male.  HPI: Jeff Ferrell is a 65 year old patient with right hip pain to reschedule him when right hip intertrochanteric fracture fixation by Dr. Erlinda Hong nurse noted drainage from the proximal incision today. He is also noted to be to An tachycardic. He is evaluated in the emergency room. He did receive some antibiotics at that time. Patient states he doesn't feel very well. He states that his right hip is significantly more painful than was a week ago.  Past Medical History  Diagnosis Date  . Diabetes mellitus     regulated by diet  . Chronic back pain   . 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 Diabetic osteomyelitis ([place]) 03/29/2015  . Diabetic foot ulcer ([place]) 03/29/2015    Past Surgical History  Procedure Laterality Date  . Back surgery      screws & hardware to lower back  . 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 Femur im nail Left 06/18/2015    Procedure: INTRAMEDULLARY (IM) NAIL FEMORAL;  Surgeon: Leandrew Koyanagi, MD;  Location: [facility];  Service: Orthopedics;  Laterality: Left;  . Femur im nail Right 07/21/2015    Procedure: Intramedullary nail femoral;  Surgeon: Leandrew Koyanagi, MD;  Location: [facility];  Service: Orthopedics;  Laterality: Right;    No family history on file.  Social History:  reports that he has never smoked. He does not have any smokeless tobacco history on file. He reports that he drinks about 3.6 oz of alcohol per week. He reports that he does not use illicit drugs.  Allergies:  Allergies  Allergen Reactions  . Penicillins Hives and Rash    Has patient had a PCN reaction causing immediate rash, facial/tongue/throat swelling, SOB or lightheadedness with hypotension: Yes Has patient had a PCN reaction causing severe rash involving mucus membranes  or skin necrosis: Yes   Has patient had a PCN reaction that required hospitalization No Has patient had a PCN reaction occurring within the last 10 years: No If all of the above answers are "NO", then may proceed with Cephalosporin use.     Medications: I have reviewed the patient's current medications.  Results for orders placed or performed during the hospital encounter of 08/07/15 (from the past 48 hour(s))  CBG monitoring, ED     Status: Abnormal   Collection Time: 08/07/15 10:50 AM  Result Value Ref Range   Glucose-Capillary 149 (H) 65 - 99 mg/dL  Comprehensive metabolic panel     Status: Abnormal   Collection Time: 08/07/15 10:53 AM  Result Value Ref Range   Sodium 123 (L) 135 - 145 mmol/L   Potassium 4.3 3.5 - 5.1 mmol/L   Chloride 89 (L) 101 - 111 mmol/L   CO2 22 22 - 32 mmol/L   Glucose, Bld 163 (H) 65 - 99 mg/dL   BUN 13 6 - 20 mg/dL   Creatinine, Ser 0.76 0.61 - 1.24 mg/dL   Calcium 9.1 8.9 - 10.3 mg/dL   Total Protein 7.6 6.5 - 8.1 g/dL   Albumin 2.9 (L) 3.5 - 5.0 g/dL   AST 21 15 - 41 U/L   ALT 17 17 - 63 U/L   Alkaline Phosphatase 244 (H) 38 - 126 U/L   Total Bilirubin 1.6 (H) 0.3 - 1.2 mg/dL  GFR calc non Af Amer >60 >60 mL/min   GFR calc Af Amer >60 >60 mL/min    Comment: (NOTE) The eGFR has been calculated using the CKD EPI equation. This calculation has not been validated in all clinical situations. eGFR's persistently <60 mL/min signify possible Chronic Kidney Disease.    Anion gap 12 5 - 15  CBC WITH DIFFERENTIAL     Status: Abnormal   Collection Time: 08/07/15 10:53 AM  Result Value Ref Range   WBC 14.0 (H) 4.0 - 10.5 K/uL   RBC 3.49 (L) 4.22 - 5.81 MIL/uL   Hemoglobin 8.9 (L) 13.0 - 17.0 g/dL   HCT 27.4 (L) 39.0 - 52.0 %   MCV 78.5 78.0 - 100.0 fL   MCH 25.5 (L) 26.0 - 34.0 pg   MCHC 32.5 30.0 - 36.0 g/dL   RDW 14.1 11.5 - 15.5 %   Platelets 464 (H) 150 - 400 K/uL   Neutrophils Relative % 84 %   Neutro Abs 11.8 (H) 1.7 - 7.7 K/uL    Lymphocytes Relative 5 %   Lymphs Abs 0.7 0.7 - 4.0 K/uL   Monocytes Relative 11 %   Monocytes Absolute 1.5 (H) 0.1 - 1.0 K/uL   Eosinophils Relative 0 %   Eosinophils Absolute 0.0 0.0 - 0.7 K/uL   Basophils Relative 0 %   Basophils Absolute 0.0 0.0 - 0.1 K/uL  Protime-INR     Status: Abnormal   Collection Time: 08/07/15 10:53 AM  Result Value Ref Range   Prothrombin Time 17.2 (H) 11.6 - 15.2 seconds   INR 1.39 0.00 - 1.49  Sedimentation rate     Status: Abnormal   Collection Time: 08/07/15 10:53 AM  Result Value Ref Range   Sed Rate 100 (H) 0 - 16 mm/hr  C-reactive protein     Status: Abnormal   Collection Time: 08/07/15 10:53 AM  Result Value Ref Range   CRP 22.8 (H) <1.0 mg/dL  I-stat chem 8, ed     Status: Abnormal   Collection Time: 08/07/15 10:57 AM  Result Value Ref Range   Sodium 126 (L) 135 - 145 mmol/L   Potassium 4.1 3.5 - 5.1 mmol/L   Chloride 90 (L) 101 - 111 mmol/L   BUN 13 6 - 20 mg/dL   Creatinine, Ser 0.60 (L) 0.61 - 1.24 mg/dL   Glucose, Bld 159 (H) 65 - 99 mg/dL   Calcium, Ion 1.17 1.13 - 1.30 mmol/L   TCO2 23 0 - 100 mmol/L   Hemoglobin 10.5 (L) 13.0 - 17.0 g/dL   HCT 31.0 (L) 39.0 - 52.0 %  I-Stat CG4 Lactic Acid, ED  (not at  [facility])     Status: None   Collection Time: 08/07/15 10:58 AM  Result Value Ref Range   Lactic Acid, Venous 1.92 0.5 - 2.0 mmol/L  Type and screen     Status: None   Collection Time: 08/07/15 11:20 AM  Result Value Ref Range   ABO/RH(D) B POS    Antibody Screen NEG    Sample Expiration 08/10/2015   Urinalysis, Routine w reflex microscopic (not at [facility])     Status: Abnormal   Collection Time: 08/07/15 12:23 PM  Result Value Ref Range   Color, Urine AMBER (A) YELLOW    Comment: BIOCHEMICALS MAY BE AFFECTED BY COLOR   APPearance CLOUDY (A) CLEAR   Specific Gravity, Urine 1.020 1.005 - 1.030   pH 6.5 5.0 - 8.0   Glucose, UA NEGATIVE NEGATIVE mg/dL  Hgb urine dipstick NEGATIVE NEGATIVE   Bilirubin Urine NEGATIVE NEGATIVE    Ketones, ur NEGATIVE NEGATIVE mg/dL   Protein, ur 30 (A) NEGATIVE mg/dL   Nitrite POSITIVE (A) NEGATIVE   Leukocytes, UA LARGE (A) NEGATIVE  Urine microscopic-add on     Status: Abnormal   Collection Time: 08/07/15 12:23 PM  Result Value Ref Range   Squamous Epithelial / LPF 0-5 (A) NONE SEEN   WBC, UA TOO NUMEROUS TO COUNT 0 - 5 WBC/hpf   RBC / HPF NONE SEEN 0 - 5 RBC/hpf   Bacteria, UA FEW (A) NONE SEEN  I-Stat CG4 Lactic Acid, ED  (not at  [facility])     Status: None   Collection Time: 08/07/15  3:53 PM  Result Value Ref Range   Lactic Acid, Venous 1.53 0.5 - 2.0 mmol/L    Dg Chest Port 1 View  08/07/2015  CLINICAL DATA:  Pt has an infected incision on his right hip. Hx of DM. No chest complaints. Pt is a nonsmoker EXAM: PORTABLE CHEST 1 VIEW COMPARISON:  07/02/2015 FINDINGS: Cardiac silhouette is normal in size and configuration. Normal mediastinal and hilar contours. Clear lungs.  No pleural effusion or pneumothorax. Bony thorax is intact. IMPRESSION: No active disease. Electronically Signed   By: Lajean Manes M.D.   On: 08/07/2015 11:24   Dg Hip Port Unilat With Pelvis 1v Right  08/07/2015  CLINICAL DATA:  Right hip fracture, status post operative fixation 2 weeks ago. Surgical incision infection with right hip pain. EXAM: DG HIP (WITH OR WITHOUT PELVIS) 1V PORT RIGHT COMPARISON:  07/18/2015 FINDINGS: Fixation hardware of the lumbar sacral spine and both hips evident. Recent right hip ORIF for an intertrochanteric fracture. Stable hardware and alignment. No definite hardware abnormality or acute osseous finding. Bony pelvis intact. Normal bowel gas pattern. IMPRESSION: No definite acute osseous or hardware abnormality by plain radiography. Electronically Signed   By: Jerilynn Mages.  Shick M.D.   On: 08/07/2015 11:26    Review of Systems  Constitutional: Positive for fever, chills and malaise/fatigue.  HENT: Negative.   Eyes: Negative.   Respiratory: Negative.   Cardiovascular: Negative.    Gastrointestinal: Negative.   Genitourinary: Negative.   Musculoskeletal: Positive for joint pain.  Skin: Negative.   Neurological: Negative.   Endo/Heme/Allergies: Negative.   Psychiatric/Behavioral: Negative.    Blood pressure 120/74, pulse 137, temperature 99.6 F (37.6 C), temperature source Oral, resp. rate 20, SpO2 100 %. Physical Exam  Constitutional: He appears well-developed.  HENT:  Head: Normocephalic.  Eyes: Pupils are equal, round, and reactive to light.  Neck: Normal range of motion.  Neurological: He is alert.  Skin: Skin is warm.  Psychiatric: He has a normal mood and affect.   patient has tachycardia rate 1:30 Patient has tachypnea respiratory rate 25-30 Right leg demonstrates some swelling around the incision with proximal drainage present. Slight coagulated blood but there is enough swelling is under pressure that he needs further evaluation Assessment/Plan: Impression is probable right hip infection versus hematoma 2 weeks status post intramedullary nail right femur. The patient also has other medical problems including diabetes malnutrition hyponatremia with sodium of 123 elevated white count with possible urinary tract infection. possible sepsis with tachypnea and tachycardia     plan for CT scan just as a baseline in case there is issues down the road with whether not this bone becomes infected. We'll also like to get a CT scan to make sure there is no inordinate amount of hip joint  effusion. Patient may require surgery either tonight or tomorrow. His INR is currently 1.79. DEAN,GREGORY SCOTT 08/07/2015, 4:10 PM

## 2015-08-07 NOTE — ED Notes (Signed)
phlebotomy at bedside.  

## 2015-08-07 NOTE — ED Notes (Signed)
Patient comes from [facility] due to a Femoral  Head Fracture.  Staff called EMS Due to bleeding at the the site. States they do not know how long the site had been. Bleeding. Per EMS staff cleaned a dressed the site PTA and bleeding has been controlled. Per EMS staff states site is red and hot to touch. Patient complains of 7/10 pain at the site. Vitals with EMS 80/42 HR 130 Patient given 600 CC NS. Patient BP on arrival 97/55. Patient alert and Oriented x4. Patient states he does not want family to be contacted.

## 2015-08-07 NOTE — ED Notes (Signed)
Attempted report 

## 2015-08-07 NOTE — ED Notes (Signed)
Phlebotomy and Dr. Agapito Games at bedside

## 2015-08-07 NOTE — ED Notes (Signed)
No bleeding noted on the dressing.

## 2015-08-07 NOTE — Progress Notes (Addendum)
Pharmacy Antibiotic Note  Jeff Ferrell is a 65 y.o. male admitted on 08/07/2015 with sepsis.  Pharmacy has been consulted for aztreonam and vancomycin dosing.  65 yo M presents on 2/18 from [place] due to a femoral head fracture. Found to be septic in the ED and started on abx. Afebrile, WBC elevated at 14. SCr stable, CrCl ~49ml/min.  Plan: Give aztreonam 2g IV x 1, then continue 2g IV Q8 Give vancomycin 1.5g IV x 1, then continue 1g IV Q12 Monitor clinical picture, renal function, VT prn F/U C&S, abx deescalation / LOT      Temp (24hrs), Avg:99.6 F (37.6 C), Min:99.6 F (37.6 C), Max:99.6 F (37.6 C)  No results for input(s): WBC, CREATININE, LATICACIDVEN, VANCOTROUGH, VANCOPEAK, VANCORANDOM, GENTTROUGH, GENTPEAK, GENTRANDOM, TOBRATROUGH, TOBRAPEAK, TOBRARND, AMIKACINPEAK, AMIKACINTROU, AMIKACIN in the last 168 hours.  CrCl cannot be calculated (Unknown ideal weight.).    Allergies  Allergen Reactions  . Penicillins Hives and Rash    Has patient had a PCN reaction causing immediate rash, facial/tongue/throat swelling, SOB or lightheadedness with hypotension: Yes Has patient had a PCN reaction causing severe rash involving mucus membranes or skin necrosis: Yes   Has patient had a PCN reaction that required hospitalization No Has patient had a PCN reaction occurring within the last 10 years: No If all of the above answers are "NO", then may proceed with Cephalosporin use.     Antimicrobials this admission: Aztreonam 2/18 >>  Vancomycin 2/18 >>   Dose adjustments this admission: n/a  Microbiology results: Blood cx 2/18 >  Wound cx 2/18 >  Urine cx 2/18 >   Thank you for allowing pharmacy to be a part of this patient's care.  Enzo Bi, PharmD, BCPS Clinical Pharmacist Pager [phone] 08/07/2015 10:57 AM

## 2015-08-07 NOTE — ED Notes (Signed)
X-ray at bedside

## 2015-08-07 NOTE — ED Provider Notes (Signed)
CSN: [id number]     Arrival date & time 08/07/15  4098 History   First MD Initiated Contact with Patient 08/07/15 1011     Chief Complaint  Patient presents with  . Post-op Problem     (Consider location/radiation/quality/duration/timing/severity/associated sxs/prior Treatment) HPI   Jeff Ferrell is a(n) 65 y.o. male who presents to the ED for evaluation of his R Hip . He was discharged from the Hospital on 07/28/2015 after Intramedullary repair of a R hip fracture. He states that over the past 2 days he has been draining signifactly more from the Incision site. He states that he thought it was blood but can't see the wound. He endorses chills last night, but denies fever. He presents tachycardic to 123 on my evaluation and hypotensive. Marland KitchenHe complains of severe recurrent cramps in his left hamstring and is unable to extend his hip to lie flat.Denies fevers, myalgias, arthralgias. He had a consult with nephrology for persistent hyponatremia and hypocalcemia. He had multiple vitals that showed tachycardia and hypotension during his admission. Denies DOE, SOB, chest tightness or pressure, radiation to left arm, jaw or back, or diaphoresis. Denies dysuria, flank pain, suprapubic pain, frequency, urgency, or hematuria. Denies headaches, light headedness, weakness, visual disturbances. Denies abdominal pain, nausea, vomiting, diarrhea or constipation.    Past Medical History  Diagnosis Date  . Diabetes mellitus     regulated by diet  . Chronic back pain   . 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 Diabetic osteomyelitis ([facility]) 03/29/2015  . Diabetic foot ulcer ([facility]) 03/29/2015   Past Surgical History  Procedure Laterality Date  . Back surgery      screws & hardware to lower back  . 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 Femur im nail Left 06/18/2015    Procedure:  INTRAMEDULLARY (IM) NAIL FEMORAL;  Surgeon: Tarry Kos, MD;  Location: [facility];  Service: Orthopedics;  Laterality: Left;  . Femur im nail Right 07/21/2015    Procedure: Intramedullary nail femoral;  Surgeon: Tarry Kos, MD;  Location: [facility];  Service: Orthopedics;  Laterality: Right;   No family history on file. Social History  Substance Use Topics  . Smoking status: Never Smoker   . Smokeless tobacco: None  . Alcohol Use: 3.6 oz/week    6 Cans of beer per week     Comment: daily    Review of Systems  Ten systems reviewed and are negative for acute change, except as noted in the HPI.      Allergies  Penicillins  Home Medications   Prior to Admission medications   Medication Sig Start Date End Date Taking? Authorizing Provider  acetaminophen (TYLENOL) 325 MG tablet Take 2 tablets (650 mg total) by mouth every 6 (six) hours as needed for mild pain (or Fever >/= 101). 07/06/15   Osvaldo Shipper, MD  bisacodyl (DULCOLAX) 10 MG suppository Place 10 mg rectally once.    Historical Provider, MD  docusate sodium (COLACE) 100 MG capsule Take 1 capsule (100 mg total) by mouth 2 (two) times daily. 07/06/15   Osvaldo Shipper, MD  enoxaparin (LOVENOX) 40 MG/0.4ML injection Inject 0.4 mLs (40 mg total) into the skin daily. 07/21/15   Tarry Kos, MD  magnesium hydroxide (MILK OF MAGNESIA) 400 MG/5ML suspension Take 30 mLs by mouth daily as needed for mild constipation.    Historical Provider,  MD  methocarbamol (ROBAXIN) 500 MG tablet Take 1 tablet (500 mg total) by mouth 4 (four) times daily. 07/28/15   Rhetta Mura, MD  oxyCODONE (OXY IR/ROXICODONE) 5 MG immediate release tablet Take 1-3 tablets (5-15 mg total) by mouth every 4 (four) hours as needed. Patient taking differently: Take 5-15 mg by mouth every 4 (four) hours as needed (mild to moderate to severe pain.). 5 mg=mild pain 10 mg=moderate pain 15=severe pain. 07/06/15   Osvaldo Shipper, MD  oxyCODONE-acetaminophen (PERCOCET) 5-325 MG  tablet Take 1-2 tablets by mouth every 4 (four) hours as needed for severe pain. 07/28/15   Rhetta Mura, MD  oxyCODONE-acetaminophen (PERCOCET) 7.5-325 MG tablet Take 1 tablet by mouth 3 (three) times daily. 07/06/15   Osvaldo Shipper, MD  polyethylene glycol (MIRALAX / GLYCOLAX) packet Take 17 g by mouth 2 (two) times daily. 07/06/15   Osvaldo Shipper, MD  sennosides-docusate sodium (SENOKOT-S) 8.6-50 MG tablet Take 1 tablet by mouth 2 (two) times daily.    Historical Provider, MD  sitaGLIPtin (JANUVIA) 50 MG tablet Take 1 tablet (50 mg total) by mouth daily. 06/23/15   Catarina Hartshorn, MD  tamsulosin (FLOMAX) 0.4 MG CAPS capsule Take 1 capsule (0.4 mg total) by mouth daily. 06/21/15   Catarina Hartshorn, MD   BP 147/80 mmHg  Pulse 119  Temp(Src) 99.6 F (37.6 C) (Oral)  Resp 18  SpO2 100% Physical Exam  Constitutional: He is oriented to person, place, and time. No distress.  Thin, chronically ill appearing male. NAD. He intermittently wince in pain due to severe spasms in the R hamstring.  HENT:  Head: Normocephalic and atraumatic.  Eyes: Conjunctivae and EOM are normal. Pupils are equal, round, and reactive to light. No scleral icterus.  Neck: Normal range of motion. Neck supple.  Cardiovascular: Normal rate, regular rhythm and normal heart sounds.   Pulmonary/Chest: Effort normal and breath sounds normal. No respiratory distress. He has no wheezes. He has no rales. He exhibits no tenderness.  Abdominal: Soft. He exhibits no distension. There is no tenderness. There is no rebound and no guarding.  Musculoskeletal: He exhibits no edema.  Lying with hips and knees in flexion. Palpable spasms in the R hamstring. Surgical incisions are warm, tender. There is some expressible purulence around the staple sites.. No signs of cellulitis or streaking.  Neurological: He is alert and oriented to person, place, and time.  Skin: Skin is warm and dry. He is not diaphoretic.  Psychiatric: His behavior is normal.   Nursing note and vitals reviewed.   ED Course  Procedures (including critical care time) Labs Review Labs Reviewed  COMPREHENSIVE METABOLIC PANEL - Abnormal; Notable for the following:    Sodium 123 (*)    Chloride 89 (*)    Glucose, Bld 163 (*)    Albumin 2.9 (*)    Alkaline Phosphatase 244 (*)    Total Bilirubin 1.6 (*)    All other components within normal limits  CBC WITH DIFFERENTIAL/PLATELET - Abnormal; Notable for the following:    WBC 14.0 (*)    RBC 3.49 (*)    Hemoglobin 8.9 (*)    HCT 27.4 (*)    MCH 25.5 (*)    Platelets 464 (*)    Neutro Abs 11.8 (*)    Monocytes Absolute 1.5 (*)    All other components within normal limits  PROTIME-INR - Abnormal; Notable for the following:    Prothrombin Time 17.2 (*)    All other components within normal limits  C-REACTIVE  PROTEIN - Abnormal; Notable for the following:    CRP 22.8 (*)    All other components within normal limits  I-STAT CHEM 8, ED - Abnormal; Notable for the following:    Sodium 126 (*)    Chloride 90 (*)    Creatinine, Ser 0.60 (*)    Glucose, Bld 159 (*)    Hemoglobin 10.5 (*)    HCT 31.0 (*)    All other components within normal limits  CBG MONITORING, ED - Abnormal; Notable for the following:    Glucose-Capillary 149 (*)    All other components within normal limits  CULTURE, BLOOD (ROUTINE X 2)  CULTURE, BLOOD (ROUTINE X 2)  URINE CULTURE  WOUND CULTURE  URINALYSIS, ROUTINE W REFLEX MICROSCOPIC (NOT AT [facility])  SEDIMENTATION RATE  I-STAT CG4 LACTIC ACID, ED  TYPE AND SCREEN    Imaging Review Dg Chest Port 1 View  08/07/2015  CLINICAL DATA:  Pt has an infected incision on his right hip. Hx of DM. No chest complaints. Pt is a nonsmoker EXAM: PORTABLE CHEST 1 VIEW COMPARISON:  07/02/2015 FINDINGS: Cardiac silhouette is normal in size and configuration. Normal mediastinal and hilar contours. Clear lungs.  No pleural effusion or pneumothorax. Bony thorax is intact. IMPRESSION: No active disease.  Electronically Signed   By: Amie Portland M.D.   On: 08/07/2015 11:24   Dg Hip Port Unilat With Pelvis 1v Right  08/07/2015  CLINICAL DATA:  Right hip fracture, status post operative fixation 2 weeks ago. Surgical incision infection with right hip pain. EXAM: DG HIP (WITH OR WITHOUT PELVIS) 1V PORT RIGHT COMPARISON:  07/18/2015 FINDINGS: Fixation hardware of the lumbar sacral spine and both hips evident. Recent right hip ORIF for an intertrochanteric fracture. Stable hardware and alignment. No definite hardware abnormality or acute osseous finding. Bony pelvis intact. Normal bowel gas pattern. IMPRESSION: No definite acute osseous or hardware abnormality by plain radiography. Electronically Signed   By: Judie Petit.  Shick M.D.   On: 08/07/2015 11:26   I have personally reviewed and evaluated these images and lab results as part of my medical decision-making.   EKG Interpretation None      ECG interpretation   Date: 08/07/2015  Rate: 118  Rhythm: sinus tachycardia   QRS Axis: normal  Intervals: normal  ST/T Wave abnormalities: normal  Conduction Disutrbances: none  Narrative Interpretation:   Old EKG Reviewed: No significant changes noted     MDM   Final diagnoses:  Hip fracture requiring operative repair ([facility])  Sepsis, due to unspecified organism ([facility]CC)  Hyponatremia  Tachycardia  Hypotension, unspecified hypotension type    11:58 AM\ BP 147/80 mmHg  Pulse 119  Temp(Src) 99.6 F (37.6 C) (Oral)  Resp 18  SpO2 100% Patient with hypotension and tachycardia. + electrolyte abnormalities during admission ? Possible adrenal insufficiency  Though afebrile, code sepsis initiated I gathered a wound culture, Sed rate, CRP, for ? Osteomyelitis. Spoke with Pharmacy, Patient has PCN allergy. Started aztreonam and vanc   11:58 AM BP 147/80 mmHg  Pulse 119  Temp(Src) 99.6 F (37.6 C) (Oral)  Resp 18  SpO2 100% Patient with negative x-ray However, his CRP rate is 22.8 today.  Although this is nonspecific. I question whether he may have an infection of the surgical hardware. Patient with some acidic anemia, which I suspect is secondary to acute blood loss during the surgery. He is up about 1 g from his last blood panel. This may also represent some volume contraction. The patient  is persistently tachycardic with significant hyponatremia, hypochloremia. Blood glucose is elevated. I have a strong concern for adrenal insufficiency given his abnormal vital signs and persistent hyponatremia from his previous visit. Currently awaiting a callback from orthopedics.   3:38 PM Sepsis - Repeat Assessment  Performed at:    3:00 PM  Vitals     Blood pressure 126/77, pulse 132, temperature 99.6 F (37.6 C), temperature source Oral, resp. rate 22, SpO2 99 %.  Heart:     Regular rate and rhythm  Lungs:    CTA  Capillary Refill:   <2 sec  Peripheral Pulse:   Distal pulses intact  Skin:     Normal Color   PATIENT ADMITTED BY HOSPITALIST GROUP FRO SEPSIS. pATIENT HAS BEEN SEEN BY DR. DEAN AND ORTHO WILL CONSULT.  Arthor Captain, PA-C 08/07/15 2022  Bethann Berkshire, MD 08/08/15 [phone]

## 2015-08-07 NOTE — H&P (Signed)
[facility] History and Physical  Rondall Radigan ZOX:[id number] DOB: 07/15/1950 DOA: 08/07/2015   PCP: [name], MD    Chief Complaint: drainage from hip incision  HPI: Jeff Ferrell is a 65 y.o. male with diabetes mellitus not on insulin, chronic hyponatremia and recent fracture of the right femoral neck with repair on 2/1 who presents because of drainage from the wound. He is noted to be tachycardic with a low-grade fever of 99 and a suspicion of a wound infection and therefore is being admitted to the hospital. Orthopedic surgery plans on taking him to the OR for drainage of the wound either today or tomorrow. He is also noted to have a UA that is positive for pyuria. He does not admit to any dysuria, suprapubic pain or increased frequency of micturition.    General: The patient denies anorexia, fever, weight loss Cardiac: Denies chest pain, syncope, palpitations, pedal edema  Respiratory: Denies cough, shortness of breath, wheezing GI: Denies severe indigestion/heartburn, abdominal pain, nausea, vomiting, diarrhea and constipation GU: Denies hematuria, incontinence, dysuria  Musculoskeletal: Denies arthritis  Skin: Denies suspicious skin lesions Neurologic: Denies focal weakness or numbness, change in vision Psychiatry: Denies depression or anxiety. Hematologic: no easy bruising or bleeding  All other systems reviewed and found to be negative.  Past Medical History  Diagnosis Date  . Diabetes mellitus     regulated by diet  . Chronic back pain   . 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 Diabetic osteomyelitis (HCC) 03/29/2015  . Diabetic foot ulcer (HCC) 03/29/2015    Past Surgical History  Procedure Laterality Date  . Back surgery      screws & hardware to lower back  . 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 Femur im nail  Left 06/18/2015    Procedure: INTRAMEDULLARY (IM) NAIL FEMORAL;  Surgeon: Tarry Kos, MD;  Location: [facility] OR;  Service: Orthopedics;  Laterality: Left;  . Femur im nail Right 07/21/2015    Procedure: Intramedullary nail femoral;  Surgeon: Tarry Kos, MD;  Location: [facility] OR;  Service: Orthopedics;  Laterality: Right;    Social History: He does not smoke, he drinks occasionally- currently residing in a nursing facility   Allergies  Allergen Reactions  . Penicillins Hives and Rash    Has patient had a PCN reaction causing immediate rash, facial/tongue/throat swelling, SOB or lightheadedness with hypotension: Yes Has patient had a PCN reaction causing severe rash involving mucus membranes or skin necrosis: Yes   Has patient had a PCN reaction that required hospitalization No Has patient had a PCN reaction occurring within the last 10 years: No If all of the above answers are "NO", then may proceed with Cephalosporin use.     Family history: No significant family history, per patient.    Prior to Admission medications   Medication Sig Start Date End Date Taking? Authorizing Provider  acetaminophen (TYLENOL) 325 MG tablet Take 2 tablets (650 mg total) by mouth every 6 (six) hours as needed for mild pain (or Fever >/= 101). 07/06/15  Yes Osvaldo Shipper, MD  docusate sodium (COLACE) 100 MG capsule Take 1 capsule (100 mg total) by mouth 2 (two) times daily. 07/06/15  Yes Osvaldo Shipper, MD  enoxaparin (LOVENOX) 40 MG/0.4ML injection Inject 0.4 mLs (40 mg total) into the skin daily. 07/21/15  Yes Naiping Donnelly Stager, MD  gabapentin (NEURONTIN) 100 MG  capsule Take 100 mg by mouth 3 (three) times daily.   Yes Historical Provider, MD  magnesium hydroxide (MILK OF MAGNESIA) 400 MG/5ML suspension Take 30 mLs by mouth daily as needed for mild constipation.   Yes Historical Provider, MD  methocarbamol (ROBAXIN) 500 MG tablet Take 1 tablet (500 mg total) by mouth 4 (four) times daily. 07/28/15  Yes Rhetta Mura, MD  oxyCODONE (OXY IR/ROXICODONE) 5 MG immediate release tablet Take 1-3 tablets (5-15 mg total) by mouth every 4 (four) hours as needed. Patient taking differently: Take 5-15 mg by mouth every 4 (four) hours as needed (mild to moderate to severe pain.). 5 mg=mild pain 10 mg=moderate pain 15=severe pain. 07/06/15  Yes Osvaldo Shipper, MD  oxyCODONE-acetaminophen (PERCOCET) 5-325 MG tablet Take 1-2 tablets by mouth every 4 (four) hours as needed for severe pain. 07/28/15  Yes Rhetta Mura, MD  sennosides-docusate sodium (SENOKOT-S) 8.6-50 MG tablet Take 1 tablet by mouth 2 (two) times daily.   Yes Historical Provider, MD  sitaGLIPtin (JANUVIA) 50 MG tablet Take 1 tablet (50 mg total) by mouth daily. 06/23/15  Yes Catarina Hartshorn, MD  tamsulosin (FLOMAX) 0.4 MG CAPS capsule Take 1 capsule (0.4 mg total) by mouth daily. 06/21/15  Yes Catarina Hartshorn, MD     Physical Exam: Filed Vitals:   08/07/15 1515 08/07/15 1530 08/07/15 1545 08/07/15 1600  BP: 149/80 126/77 120/74 139/84  Pulse: 128 132 137 132  Temp:      TempSrc:      Resp: 19 22 20 20   SpO2: 100% 99% 100% 100%     General: Awake alert oriented 3, no acute distress HEENT: Normocephalic and Atraumatic, Mucous membranes pink                PERRLA; EOM intact; No scleral icterus,                 Nares: Patent, Oropharynx: Clear, Fair Dentition                 Neck: FROM, no cervical lymphadenopathy, thyromegaly, carotid bruit or JVD;  Breasts: deferred CHEST WALL: No tenderness  CHEST: Normal respiration, clear to auscultation bilaterally  HEART: Regular rate and rhythm; no murmurs rubs or gallops - tachycardic BACK: No kyphosis or scoliosis; no CVA tenderness  GI: Positive Bowel Sounds, soft, non-tender; no masses, no organomegaly Rectal Exam: deferred MSK: No cyanosis, clubbing, or edema- dressing noted on right hip with bloody drainage, incision is erythematous and area is quite tender to touch. Genitalia: not examined  SKIN:   no rash or ulceration  CNS: Alert and Oriented x 4, Nonfocal exam, CN 2-12 intact  Labs on Admission:  Basic Metabolic Panel:  Recent Labs Lab 08/07/15 1053 08/07/15 1057  NA 123* 126*  K 4.3 4.1  CL 89* 90*  CO2 22  --   GLUCOSE 163* 159*  BUN 13 13  CREATININE 0.76 0.60*  CALCIUM 9.1  --    Liver Function Tests:  Recent Labs Lab 08/07/15 1053  AST 21  ALT 17  ALKPHOS 244*  BILITOT 1.6*  PROT 7.6  ALBUMIN 2.9*   No results for input(s): LIPASE, AMYLASE in the last 168 hours. No results for input(s): AMMONIA in the last 168 hours. CBC:  Recent Labs Lab 08/07/15 1053 08/07/15 1057  WBC 14.0*  --   NEUTROABS 11.8*  --   HGB 8.9* 10.5*  HCT 27.4* 31.0*  MCV 78.5  --   PLT 464*  --  Cardiac Enzymes: No results for input(s): CKTOTAL, CKMB, CKMBINDEX, TROPONINI in the last 168 hours.  BNP (last 3 results)  Recent Labs  06/11/15 1106 07/02/15 0538  BNP 27.7 34.0    ProBNP (last 3 results) No results for input(s): PROBNP in the last 8760 hours.  CBG:  Recent Labs Lab 08/07/15 1050  GLUCAP 149*    Radiological Exams on Admission: Dg Chest Port 1 View  08/07/2015  CLINICAL DATA:  Pt has an infected incision on his right hip. Hx of DM. No chest complaints. Pt is a nonsmoker EXAM: PORTABLE CHEST 1 VIEW COMPARISON:  07/02/2015 FINDINGS: Cardiac silhouette is normal in size and configuration. Normal mediastinal and hilar contours. Clear lungs.  No pleural effusion or pneumothorax. Bony thorax is intact. IMPRESSION: No active disease. Electronically Signed   By: Amie Portland M.D.   On: 08/07/2015 11:24   Dg Hip Port Unilat With Pelvis 1v Right  08/07/2015  CLINICAL DATA:  Right hip fracture, status post operative fixation 2 weeks ago. Surgical incision infection with right hip pain. EXAM: DG HIP (WITH OR WITHOUT PELVIS) 1V PORT RIGHT COMPARISON:  07/18/2015 FINDINGS: Fixation hardware of the lumbar sacral spine and both hips evident. Recent right hip ORIF  for an intertrochanteric fracture. Stable hardware and alignment. No definite hardware abnormality or acute osseous finding. Bony pelvis intact. Normal bowel gas pattern. IMPRESSION: No definite acute osseous or hardware abnormality by plain radiography. Electronically Signed   By: Judie Petit.  Shick M.D.   On: 08/07/2015 11:26    EKG: Independently reviewed. Sinus tachycardia at 118 bpm  Assessment/Plan Principal Problem:   Sepsis  -Suspected to be secondary to an infected right hip wound and possibly also UTI -Orthopedic surgery has evaluated the patient and is asking [place] to admit- plans on taking patient to the OR for I and D -Started on vancomycin and Azactam -Admit to stepdown unit- follow fever trend-continue aggressive IV fluids and monitor I&O closely   Active Problems:    DM type 2, goal HbA1c < 7% (HCC) -Noted to be taking Januvia daily-we'll place him on an insulin sliding scale for now  Hyponatremia -Low sodium and chloride noted on last admission and workup was done -He had a normal cortisol level -He was on furosemide at that time and it was suspect that he may be hypovolemic- he was given normal saline for a couple of days which steadily improved his sodium level to 133 prior to discharge -We'll start continuous IV fluids today after his boluses are complete  Cachexia - Monitor by mouth intake  - Start ensure 3 times a day   Consulted: Orthopedic surgery  Code Status: Full code  Family Communication:   DVT Prophylaxis: Heparin  Time spent: 50 min  Satoya Feeley, MD [facility]  If 7PM-7AM, please contact night-coverage [email or web address] 08/07/2015, 4:50 PM

## 2015-08-07 NOTE — Progress Notes (Signed)
Pharmacy Code Sepsis Protocol  Time of code sepsis page: 1059  Abx ordered prior to page. Aztreonam hung at 1113  Were antibiotics ordered at the time of the code sepsis page? No (Before)  Was it required to contact the physician?  Physician not contacted  Physician contacted to order antibiotics for code sepsis  Physician contacted to recommend changing antibiotics  Pharmacy consulted for: Aztreonam and vancomycin   Anti-infectives    Start     Dose/Rate Route Frequency Ordered Stop   08/07/15 1130  vancomycin (VANCOCIN) 1,500 mg in sodium chloride 0.9 % 500 mL IVPB     1,500 mg 250 mL/hr over 120 Minutes Intravenous  Once 08/07/15 1054     08/07/15 1100  aztreonam (AZACTAM) 2 g in dextrose 5 % 50 mL IVPB     2 g 100 mL/hr over 30 Minutes Intravenous  Once 08/07/15 1054          Armandina Stammer, PharmD 08/07/2015, 11:19 AM

## 2015-08-08 ENCOUNTER — Inpatient Hospital Stay (HOSPITAL_COMMUNITY): Payer: Medicare Other | Admitting: Certified Registered Nurse Anesthetist

## 2015-08-08 ENCOUNTER — Encounter (HOSPITAL_COMMUNITY)
Admission: EM | Disposition: A | Discharge status: Home Health | Payer: Self-pay | Source: Home / Self Care | Attending: Internal Medicine

## 2015-08-08 DIAGNOSIS — E119 Type 2 diabetes mellitus without complications: Secondary | ICD-10-CM

## 2015-08-08 DIAGNOSIS — B999 Unspecified infectious disease: Secondary | ICD-10-CM | POA: Diagnosis present

## 2015-08-08 DIAGNOSIS — B9689 Other specified bacterial agents as the cause of diseases classified elsewhere: Secondary | ICD-10-CM

## 2015-08-08 DIAGNOSIS — N390 Urinary tract infection, site not specified: Secondary | ICD-10-CM

## 2015-08-08 DIAGNOSIS — Y838 Other surgical procedures as the cause of abnormal reaction of the patient, or of later complication, without mention of misadventure at the time of the procedure: Secondary | ICD-10-CM

## 2015-08-08 DIAGNOSIS — Z9889 Other specified postprocedural states: Secondary | ICD-10-CM

## 2015-08-08 DIAGNOSIS — T814XXD Infection following a procedure, subsequent encounter: Secondary | ICD-10-CM

## 2015-08-08 DIAGNOSIS — A419 Sepsis, unspecified organism: Secondary | ICD-10-CM

## 2015-08-08 HISTORY — PX: INCISION AND DRAINAGE HIP: SHX1801

## 2015-08-08 LAB — CBC
HEMATOCRIT: 22 % — AB (ref 39.0–52.0)
Hemoglobin: 7.1 g/dL — ABNORMAL LOW (ref 13.0–17.0)
MCH: 25.4 pg — ABNORMAL LOW (ref 26.0–34.0)
MCHC: 32.3 g/dL (ref 30.0–36.0)
MCV: 78.9 fL (ref 78.0–100.0)
Platelets: 332 10*3/uL (ref 150–400)
RBC: 2.79 MIL/uL — ABNORMAL LOW (ref 4.22–5.81)
RDW: 14.1 % (ref 11.5–15.5)
WBC: 11.5 10*3/uL — ABNORMAL HIGH (ref 4.0–10.5)

## 2015-08-08 LAB — MRSA PCR SCREENING: MRSA BY PCR: POSITIVE — AB

## 2015-08-08 LAB — BASIC METABOLIC PANEL
Anion gap: 7 (ref 5–15)
BUN: 8 mg/dL (ref 6–20)
CALCIUM: 8.3 mg/dL — AB (ref 8.9–10.3)
CO2: 21 mmol/L — ABNORMAL LOW (ref 22–32)
CREATININE: 0.52 mg/dL — AB (ref 0.61–1.24)
Chloride: 100 mmol/L — ABNORMAL LOW (ref 101–111)
GFR calc Af Amer: >60 mL/min (ref >60)
GLUCOSE: 122 mg/dL — AB (ref 65–99)
POTASSIUM: 3.9 mmol/L (ref 3.5–5.1)
Sodium: 128 mmol/L — ABNORMAL LOW (ref 135–145)

## 2015-08-08 LAB — GLUCOSE, CAPILLARY
GLUCOSE-CAPILLARY: 127 mg/dL — AB (ref 65–99)
GLUCOSE-CAPILLARY: 129 mg/dL — AB (ref 65–99)
GLUCOSE-CAPILLARY: 139 mg/dL — AB (ref 65–99)
GLUCOSE-CAPILLARY: 140 mg/dL — AB (ref 65–99)
Glucose-Capillary: 147 mg/dL — ABNORMAL HIGH (ref 65–99)

## 2015-08-08 LAB — LACTIC ACID, PLASMA: Lactic Acid, Venous: 1.1 mmol/L (ref 0.5–2.0)

## 2015-08-08 SURGERY — IRRIGATION AND DEBRIDEMENT HIP
Anesthesia: General | Site: Hip | Laterality: Right

## 2015-08-08 MED ORDER — ALBUMIN HUMAN 5 % IV SOLN
INTRAVENOUS | Status: AC
  Filled 2015-08-08: qty 250

## 2015-08-08 MED ORDER — SUCCINYLCHOLINE CHLORIDE 20 MG/ML IJ SOLN
INTRAMUSCULAR | Status: AC
  Filled 2015-08-08: qty 1

## 2015-08-08 MED ORDER — ACETAMINOPHEN 325 MG PO TABS
ORAL_TABLET | ORAL | Status: AC
  Administered 2015-08-08: 650 mg via ORAL
  Filled 2015-08-08: qty 2

## 2015-08-08 MED ORDER — ACETAMINOPHEN 650 MG RE SUPP: 650.0000 mg | Freq: Four times a day (QID) | RECTAL | Status: DC | PRN

## 2015-08-08 MED ORDER — ALBUMIN HUMAN 5 % IV SOLN
INTRAVENOUS | Status: AC
  Administered 2015-08-08: 12.5 g
  Filled 2015-08-08: qty 250

## 2015-08-08 MED ORDER — ALBUMIN HUMAN 5 % IV SOLN
INTRAVENOUS | Status: DC | PRN
  Administered 2015-08-08 (×2): via INTRAVENOUS

## 2015-08-08 MED ORDER — ROCURONIUM BROMIDE 50 MG/5ML IV SOLN
INTRAVENOUS | Status: AC
  Filled 2015-08-08: qty 1

## 2015-08-08 MED ORDER — HEPARIN SODIUM (PORCINE) 5000 UNIT/ML IJ SOLN: 5000.0000 [IU] | Freq: Three times a day (TID) | INTRAMUSCULAR | Status: DC

## 2015-08-08 MED ORDER — PROPOFOL 10 MG/ML IV BOLUS
INTRAVENOUS | Status: AC
  Filled 2015-08-08: qty 20

## 2015-08-08 MED ORDER — PHENYLEPHRINE 40 MCG/ML (10ML) SYRINGE FOR IV PUSH (FOR BLOOD PRESSURE SUPPORT)
PREFILLED_SYRINGE | INTRAVENOUS | Status: AC
  Filled 2015-08-08: qty 10

## 2015-08-08 MED ORDER — GLYCOPYRROLATE 0.2 MG/ML IJ SOLN
INTRAMUSCULAR | Status: DC | PRN
  Administered 2015-08-08: 0.4 mg via INTRAVENOUS

## 2015-08-08 MED ORDER — PROMETHAZINE HCL 25 MG/ML IJ SOLN: 6.2500 mg | INTRAMUSCULAR | Status: DC | PRN

## 2015-08-08 MED ORDER — MUPIROCIN 2 % EX OINT
1.0000 "application " | TOPICAL_OINTMENT | Freq: Two times a day (BID) | CUTANEOUS | Status: AC
  Administered 2015-08-08 – 2015-08-12 (×9): 1 via NASAL
  Filled 2015-08-08: qty 22

## 2015-08-08 MED ORDER — TRANEXAMIC ACID 1000 MG/10ML IV SOLN
2000.0000 mg | Freq: Once | INTRAVENOUS | Status: DC
  Filled 2015-08-08: qty 20

## 2015-08-08 MED ORDER — PHENYLEPHRINE HCL 10 MG/ML IJ SOLN
10.0000 mg | INTRAVENOUS | Status: DC | PRN
  Administered 2015-08-08: 20 ug/min via INTRAVENOUS

## 2015-08-08 MED ORDER — ONDANSETRON HCL 4 MG PO TABS: 4.0000 mg | ORAL_TABLET | Freq: Four times a day (QID) | ORAL | Status: DC | PRN

## 2015-08-08 MED ORDER — VANCOMYCIN HCL 500 MG IV SOLR
INTRAVENOUS | Status: AC
  Filled 2015-08-08: qty 500

## 2015-08-08 MED ORDER — NEOSTIGMINE METHYLSULFATE 10 MG/10ML IV SOLN
INTRAVENOUS | Status: DC | PRN
  Administered 2015-08-08: 3 mg via INTRAVENOUS

## 2015-08-08 MED ORDER — ENSURE ENLIVE PO LIQD
237.0000 mL | Freq: Three times a day (TID) | ORAL | Status: DC
  Administered 2015-08-09 – 2015-08-25 (×17): 237 mL via ORAL
  Administered 2015-08-29: 120 mL via ORAL
  Administered 2015-09-01 – 2015-09-03 (×4): 237 mL via ORAL

## 2015-08-08 MED ORDER — SODIUM CHLORIDE 0.9 % IR SOLN
Status: DC | PRN
  Administered 2015-08-08: 3000 mL

## 2015-08-08 MED ORDER — RIFAMPIN 300 MG PO CAPS
300.0000 mg | ORAL_CAPSULE | Freq: Every day | ORAL | Status: DC
  Administered 2015-08-08 – 2015-08-15 (×6): 300 mg via ORAL
  Filled 2015-08-08 (×9): qty 1

## 2015-08-08 MED ORDER — SUCCINYLCHOLINE CHLORIDE 20 MG/ML IJ SOLN
INTRAMUSCULAR | Status: DC | PRN
  Administered 2015-08-08: 80 mg via INTRAVENOUS

## 2015-08-08 MED ORDER — ONDANSETRON HCL 4 MG/2ML IJ SOLN
INTRAMUSCULAR | Status: AC
  Filled 2015-08-08: qty 2

## 2015-08-08 MED ORDER — METOCLOPRAMIDE HCL 5 MG PO TABS: 5.0000 mg | ORAL_TABLET | Freq: Three times a day (TID) | ORAL | Status: DC | PRN

## 2015-08-08 MED ORDER — FENTANYL CITRATE (PF) 250 MCG/5ML IJ SOLN
INTRAMUSCULAR | Status: AC
  Filled 2015-08-08: qty 5

## 2015-08-08 MED ORDER — SODIUM CHLORIDE 0.9 % IV BOLUS (SEPSIS)
250.0000 mL | Freq: Once | INTRAVENOUS | Status: AC
  Administered 2015-08-08: 250 mL via INTRAVENOUS

## 2015-08-08 MED ORDER — METOCLOPRAMIDE HCL 5 MG/ML IJ SOLN: 5.0000 mg | Freq: Three times a day (TID) | INTRAMUSCULAR | Status: DC | PRN

## 2015-08-08 MED ORDER — METHOCARBAMOL 500 MG PO TABS
500.0000 mg | ORAL_TABLET | Freq: Four times a day (QID) | ORAL | Status: DC
  Administered 2015-08-08 – 2015-08-11 (×13): 500 mg via ORAL
  Filled 2015-08-08 (×13): qty 1

## 2015-08-08 MED ORDER — GENTAMICIN SULFATE 40 MG/ML IJ SOLN
INTRAMUSCULAR | Status: AC
  Filled 2015-08-08: qty 4

## 2015-08-08 MED ORDER — MIDAZOLAM HCL 5 MG/5ML IJ SOLN
INTRAMUSCULAR | Status: DC | PRN
  Administered 2015-08-08 (×2): 1 mg via INTRAVENOUS

## 2015-08-08 MED ORDER — INSULIN ASPART 100 UNIT/ML ~~LOC~~ SOLN
0.0000 [IU] | Freq: Every day | SUBCUTANEOUS | Status: DC
  Administered 2015-08-19: 2 [IU] via SUBCUTANEOUS

## 2015-08-08 MED ORDER — HYDROMORPHONE HCL 1 MG/ML IJ SOLN: 0.2500 mg | INTRAMUSCULAR | Status: DC | PRN

## 2015-08-08 MED ORDER — ENOXAPARIN SODIUM 40 MG/0.4ML ~~LOC~~ SOLN
40.0000 mg | SUBCUTANEOUS | Status: DC
  Administered 2015-08-09 – 2015-08-11 (×3): 40 mg via SUBCUTANEOUS
  Filled 2015-08-08 (×3): qty 0.4

## 2015-08-08 MED ORDER — ACETAMINOPHEN 325 MG PO TABS
650.0000 mg | ORAL_TABLET | Freq: Four times a day (QID) | ORAL | Status: DC | PRN
  Administered 2015-08-09 – 2015-08-28 (×11): 650 mg via ORAL
  Filled 2015-08-08 (×15): qty 2

## 2015-08-08 MED ORDER — PHENYLEPHRINE HCL 10 MG/ML IJ SOLN
INTRAMUSCULAR | Status: AC
  Filled 2015-08-08: qty 1

## 2015-08-08 MED ORDER — MIDAZOLAM HCL 2 MG/2ML IJ SOLN
INTRAMUSCULAR | Status: AC
  Filled 2015-08-08: qty 2

## 2015-08-08 MED ORDER — DEXTROSE 5 % IV SOLN
1.0000 g | INTRAVENOUS | Status: DC
  Administered 2015-08-08: 1 g via INTRAVENOUS
  Filled 2015-08-08 (×2): qty 10

## 2015-08-08 MED ORDER — GENTAMICIN SULFATE 40 MG/ML IJ SOLN
INTRAMUSCULAR | Status: DC | PRN
  Administered 2015-08-08: 160 mg

## 2015-08-08 MED ORDER — PROPOFOL 10 MG/ML IV BOLUS
INTRAVENOUS | Status: DC | PRN
  Administered 2015-08-08: 100 mg via INTRAVENOUS

## 2015-08-08 MED ORDER — EPHEDRINE SULFATE 50 MG/ML IJ SOLN
INTRAMUSCULAR | Status: AC
  Filled 2015-08-08: qty 1

## 2015-08-08 MED ORDER — ALBUMIN HUMAN 5 % IV SOLN
12.5000 g | Freq: Once | INTRAVENOUS | Status: AC
  Administered 2015-08-08: 12.5 g via INTRAVENOUS

## 2015-08-08 MED ORDER — TRANEXAMIC ACID 1000 MG/10ML IV SOLN
2000.0000 mg | INTRAVENOUS | Status: DC | PRN
  Administered 2015-08-08: 2000 mg via TOPICAL

## 2015-08-08 MED ORDER — ROCURONIUM BROMIDE 100 MG/10ML IV SOLN
INTRAVENOUS | Status: DC | PRN
  Administered 2015-08-08: 20 mg via INTRAVENOUS
  Administered 2015-08-08: 15 mg via INTRAVENOUS

## 2015-08-08 MED ORDER — ONDANSETRON HCL 4 MG/2ML IJ SOLN: 4.0000 mg | Freq: Four times a day (QID) | INTRAMUSCULAR | Status: DC | PRN

## 2015-08-08 MED ORDER — CHLORHEXIDINE GLUCONATE CLOTH 2 % EX PADS
6.0000 | MEDICATED_PAD | Freq: Every day | CUTANEOUS | Status: AC
  Administered 2015-08-09 – 2015-08-13 (×4): 6 via TOPICAL

## 2015-08-08 MED ORDER — FENTANYL CITRATE (PF) 100 MCG/2ML IJ SOLN
INTRAMUSCULAR | Status: DC | PRN
  Administered 2015-08-08 (×4): 50 ug via INTRAVENOUS

## 2015-08-08 MED ORDER — LIDOCAINE HCL (CARDIAC) 20 MG/ML IV SOLN
INTRAVENOUS | Status: AC
  Filled 2015-08-08: qty 5

## 2015-08-08 MED ORDER — PHENYLEPHRINE HCL 10 MG/ML IJ SOLN
INTRAMUSCULAR | Status: DC | PRN
  Administered 2015-08-08 (×5): 80 ug via INTRAVENOUS

## 2015-08-08 MED ORDER — VANCOMYCIN HCL 500 MG IV SOLR
INTRAVENOUS | Status: DC | PRN
  Administered 2015-08-08: 500 mg

## 2015-08-08 MED ORDER — LIDOCAINE HCL (CARDIAC) 20 MG/ML IV SOLN
INTRAVENOUS | Status: DC | PRN
  Administered 2015-08-08: 80 mg via INTRAVENOUS

## 2015-08-08 MED ORDER — ONDANSETRON HCL 4 MG/2ML IJ SOLN
INTRAMUSCULAR | Status: DC | PRN
Start: 2015-08-08 — End: 2015-08-08
  Administered 2015-08-08: 4 mg via INTRAVENOUS

## 2015-08-08 MED ORDER — INSULIN ASPART 100 UNIT/ML ~~LOC~~ SOLN
0.0000 [IU] | Freq: Three times a day (TID) | SUBCUTANEOUS | Status: DC
  Administered 2015-08-08 – 2015-08-10 (×3): 1 [IU] via SUBCUTANEOUS
  Administered 2015-08-10: 2 [IU] via SUBCUTANEOUS
  Administered 2015-08-11 (×2): 1 [IU] via SUBCUTANEOUS
  Administered 2015-08-11 – 2015-08-13 (×2): 2 [IU] via SUBCUTANEOUS
  Administered 2015-08-14: 1 [IU] via SUBCUTANEOUS
  Administered 2015-08-14: 2 [IU] via SUBCUTANEOUS
  Administered 2015-08-15: 1 [IU] via SUBCUTANEOUS
  Administered 2015-08-19: 2 [IU] via SUBCUTANEOUS
  Administered 2015-08-19 (×2): 1 [IU] via SUBCUTANEOUS
  Administered 2015-08-20: 2 [IU] via SUBCUTANEOUS
  Administered 2015-08-20: 1 [IU] via SUBCUTANEOUS
  Administered 2015-08-20: 2 [IU] via SUBCUTANEOUS
  Administered 2015-08-26: 1 [IU] via SUBCUTANEOUS

## 2015-08-08 MED ORDER — TAMSULOSIN HCL 0.4 MG PO CAPS
0.4000 mg | ORAL_CAPSULE | Freq: Every day | ORAL | Status: DC
  Administered 2015-08-08 – 2015-09-03 (×18): 0.4 mg via ORAL
  Filled 2015-08-08 (×21): qty 1

## 2015-08-08 SURGICAL SUPPLY — 49 items
BAG DECANTER FOR FLEXI CONT (MISCELLANEOUS) ×2 IMPLANT
BLADE SURG 10 STRL SS (BLADE) ×2 IMPLANT
CANISTER WOUND CARE 500ML ATS (WOUND CARE) ×2 IMPLANT
COVER SURGICAL LIGHT HANDLE (MISCELLANEOUS) ×3 IMPLANT
DRAPE IMP U-DRAPE 54X76 (DRAPES) ×3 IMPLANT
DRAPE ORTHO SPLIT 77X108 STRL (DRAPES) ×6
DRAPE SURG ORHT 6 SPLT 77X108 (DRAPES) ×2 IMPLANT
DRAPE U-SHAPE 47X51 STRL (DRAPES) ×5 IMPLANT
DRSG ADAPTIC 3X8 NADH LF (GAUZE/BANDAGES/DRESSINGS) ×3 IMPLANT
DRSG PAD ABDOMINAL 8X10 ST (GAUZE/BANDAGES/DRESSINGS) ×1 IMPLANT
DRSG TEGADERM 4X4.75 (GAUZE/BANDAGES/DRESSINGS) ×2 IMPLANT
DURAPREP 26ML APPLICATOR (WOUND CARE) ×1 IMPLANT
ELECT CAUTERY BLADE 6.4 (BLADE) IMPLANT
ELECT REM PT RETURN 9FT ADLT (ELECTROSURGICAL)
ELECTRODE REM PT RTRN 9FT ADLT (ELECTROSURGICAL) IMPLANT
GAUZE SPONGE 4X4 12PLY STRL (GAUZE/BANDAGES/DRESSINGS) ×1 IMPLANT
GLOVE BIOGEL PI IND STRL 8 (GLOVE) ×1 IMPLANT
GLOVE BIOGEL PI INDICATOR 8 (GLOVE) ×2
GLOVE SURG ORTHO 8.0 STRL STRW (GLOVE) ×3 IMPLANT
GOWN STRL REUS W/ TWL LRG LVL3 (GOWN DISPOSABLE) ×1 IMPLANT
GOWN STRL REUS W/TWL LRG LVL3 (GOWN DISPOSABLE) ×6
HANDPIECE INTERPULSE COAX TIP (DISPOSABLE)
KIT BASIN OR (CUSTOM PROCEDURE TRAY) ×3 IMPLANT
KIT REMOVER STAPLE SKIN (MISCELLANEOUS) ×2 IMPLANT
KIT ROOM TURNOVER OR (KITS) ×3 IMPLANT
KIT STIMULAN RAPID CURE 5CC (Orthopedic Implant) ×2 IMPLANT
MANIFOLD NEPTUNE II (INSTRUMENTS) ×3 IMPLANT
NDL SAFETY ECLIPSE 18X1.5 (NEEDLE) IMPLANT
NDL SPNL 18GX3.5 QUINCKE PK (NEEDLE) IMPLANT
NEEDLE HYPO 18GX1.5 SHARP (NEEDLE) ×3
NEEDLE SPNL 18GX3.5 QUINCKE PK (NEEDLE) ×3 IMPLANT
NS IRRIG 1000ML POUR BTL (IV SOLUTION) ×15 IMPLANT
PACK TOTAL JOINT (CUSTOM PROCEDURE TRAY) ×3 IMPLANT
PACK UNIVERSAL I (CUSTOM PROCEDURE TRAY) ×1 IMPLANT
PAD ARMBOARD 7.5X6 YLW CONV (MISCELLANEOUS) ×6 IMPLANT
PREVENA INCISION MGT 90 150 (MISCELLANEOUS) ×2 IMPLANT
SET HNDPC FAN SPRY TIP SCT (DISPOSABLE) IMPLANT
SPONGE LAP 18X18 X RAY DECT (DISPOSABLE) ×5 IMPLANT
SUT ETHILON 2 0 PSLX (SUTURE) ×4 IMPLANT
SUT VIC AB 0 CT1 27 (SUTURE) ×9
SUT VIC AB 0 CT1 27XBRD ANBCTR (SUTURE) IMPLANT
SWAB CULTURE ESWAB REG 1ML (MISCELLANEOUS) ×4 IMPLANT
SYR 20CC LL (SYRINGE) ×2 IMPLANT
SYR CONTROL 10ML LL (SYRINGE) ×2 IMPLANT
TOWEL OR 17X24 6PK STRL BLUE (TOWEL DISPOSABLE) ×3 IMPLANT
TOWEL OR 17X26 10 PK STRL BLUE (TOWEL DISPOSABLE) ×3 IMPLANT
TUBE ANAEROBIC SPECIMEN COL (MISCELLANEOUS) ×4 IMPLANT
UNDERPAD 30X30 INCONTINENT (UNDERPADS AND DIAPERS) ×3 IMPLANT
WATER STERILE IRR 1000ML POUR (IV SOLUTION) ×3 IMPLANT

## 2015-08-08 NOTE — Op Note (Signed)
NAME:  Jeff Ferrell, Jeff Ferrell NO.:  [id number]  MEDICAL RECORD NO.:  [id number]  LOCATION:  3S02C                        FACILITY:  [facility]  PHYSICIAN:  Burnard Bunting, M.D.    DATE OF BIRTH:  Dec 09, 1950  DATE OF PROCEDURE: DATE OF DISCHARGE:                              OPERATIVE REPORT   PREOPERATIVE DIAGNOSIS:  Right hip infection.  POSTOPERATIVE DIAGNOSIS:  Right hip infection.  PROCEDURE:  Right hip I and D with placement of antibiotic beads, placement of tranexamic acid, placement of wound VAC.  SURGEON:  Burnard Bunting, MD  ASSISTANT:  None.  ANESTHESIA:  General.  INDICATIONS:  [name] is a 65 year old patient, with bilateral hip fractures, the intertrochanteric fracture done 2 weeks ago by Dr. Johnnye Sima, had some drainage from the incision as well as an early sepsis in the hospital, who presents now for operative management after explanation of risks and benefits.  PROCEDURE IN DETAIL:  The patient was brought to operating room, where general anesthetic was induced.  Perioperative IV antibiotics were maintained.  The patient was placed in lateral position with the left axilla, left peroneal nerve well-padded.  The right leg was prepped with Hibiclens and saline draped in sterile manner.  Time-out was called. Staples were removed.  The distal incision was first approached.  Skin and subcutaneous tissue were sharply divided.  Serous fluid that had a pinch of purulence was not encountered.  This was cultured.  Thorough excisional debridement was then performed down to the bone using a curette.  Following thorough excisional debridement, the incision was irrigated with 3 L of saline.  Antibiotic beads were then placed and the incision was loosely reapproximated using 0 Vicryl and 2-0 nylon.  In the similar manner, the proximal incision was opened.  The incision was extended about 2 cm proximal and distal, significant hematoma was encountered some purulence was  encountered, which did track down to the tip of the prosthesis.  Large ball of clot was removed.  Thorough irrigation also performed along with excisional debridement using a curette down to the bone.  Following this excisional debridement, thorough irrigation was performed with 3 L of irrigating solution. Antibiotic beads of vancomycin and gentamicin placed and the wound was then loosely reapproximated using 0 Vicryl suture, and 2-0 nylon suture. Tranexamic acid was utilized in this incision as well to minimize postop bleeding and postop clot formation. Wound VAC was placed.  The patient was then transferred to recovery in stable condition.  It was the opinion of the anesthesiologist that the patient was septic or had early sepsis.  For that reason, he will be maintained on medical service for medical stabilization.     Burnard Bunting, M.D.     GSD/MEDQ  D:  08/08/2015  T:  08/08/2015  Job:  [id number]

## 2015-08-08 NOTE — Brief Op Note (Signed)
08/07/2015 - 08/08/2015  9:34 AM  PATIENT:  Jeff Ferrell  65 y.o. male  PRE-OPERATIVE DIAGNOSIS:  infected right hip  POST-OPERATIVE DIAGNOSIS:  Right hip incisional infection  PROCEDURE:  Procedure(s): IRRIGATION AND DEBRIDEMENT RIGHT HIP  SURGEON:  Surgeon(s): Cammy Copa, MD  ASSISTANT: none  ANESTHESIA:   general  EBL: 75 ml    Total I/O In: 1300 [I.V.:800; IV Piggyback:500] Out: 50 [Blood:50]  BLOOD ADMINISTERED: none  DRAINS: wound vac   LOCAL MEDICATIONS USED:  none  SPECIMEN:  Cultures x 2  COUNTS:  YES  TOURNIQUET:  * No tourniquets in log *  DICTATION: .Other Dictation: Dictation Number [id number]  PLAN OF CARE: Admit to inpatient   PATIENT DISPOSITION:  PACU - hemodynamically stable

## 2015-08-08 NOTE — Progress Notes (Addendum)
ANTICOAGULATION CONSULT NOTE - Initial Consult  Pharmacy Consult for coumadin Indication: VTE prophylaxis  Allergies  Allergen Reactions  . Penicillins Hives and Rash    Has patient had a PCN reaction causing immediate rash, facial/tongue/throat swelling, SOB or lightheadedness with hypotension: Yes Has patient had a PCN reaction causing severe rash involving mucus membranes or skin necrosis: Yes   Has patient had a PCN reaction that required hospitalization No Has patient had a PCN reaction occurring within the last 10 years: No If all of the above answers are "NO", then may proceed with Cephalosporin use.     Vital Signs: Temp: 100.5 F (38.1 C) (02/19 1030) Temp Source: Oral (02/19 0300) BP: 84/52 mmHg (02/19 1115) Pulse Rate: 115 (02/19 1045)  Labs:  Recent Labs  08/07/15 1053 08/07/15 1057 08/07/15 1728 08/08/15 0315  HGB 8.9* 10.5* 8.4* 7.1*  HCT 27.4* 31.0* 25.8* 22.0*  PLT 464*  --  453* 332  LABPROT 17.2*  --   --   --   INR 1.39  --   --   --   CREATININE 0.76 0.60* 0.65 0.52*    CrCl cannot be calculated (Unknown ideal weight.).   Medical History: Past Medical History  Diagnosis Date  . Diabetes mellitus     regulated by diet  . Chronic back pain   . 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 Diabetic osteomyelitis (HCC) 03/29/2015  . Diabetic foot ulcer (HCC) 03/29/2015    Medications:  Prescriptions prior to admission  Medication Sig Dispense Refill Last Dose  . acetaminophen (TYLENOL) 325 MG tablet Take 2 tablets (650 mg total) by mouth every 6 (six) hours as needed for mild pain (or Fever >/= 101).   PRN  . docusate sodium (COLACE) 100 MG capsule Take 1 capsule (100 mg total) by mouth 2 (two) times daily. 60 capsule 0 08/07/2015 at Unknown time  . enoxaparin (LOVENOX) 40 MG/0.4ML injection Inject 0.4 mLs (40 mg total) into the skin daily. 14 Syringe 0 08/07/2015 at 0800  . gabapentin (NEURONTIN) 100 MG capsule Take 100 mg by mouth 3 (three)  times daily.   08/07/2015 at Unknown time  . magnesium hydroxide (MILK OF MAGNESIA) 400 MG/5ML suspension Take 30 mLs by mouth daily as needed for mild constipation.   PRN  . methocarbamol (ROBAXIN) 500 MG tablet Take 1 tablet (500 mg total) by mouth 4 (four) times daily. 90 tablet 0 08/07/2015 at Unknown time  . oxyCODONE (OXY IR/ROXICODONE) 5 MG immediate release tablet Take 1-3 tablets (5-15 mg total) by mouth every 4 (four) hours as needed. (Patient taking differently: Take 5-15 mg by mouth every 4 (four) hours as needed (mild to moderate to severe pain.). 5 mg=mild pain 10 mg=moderate pain 15=severe pain.) 30 tablet 0 PRN  . oxyCODONE-acetaminophen (PERCOCET) 5-325 MG tablet Take 1-2 tablets by mouth every 4 (four) hours as needed for severe pain. 90 tablet 0 08/07/2015 at Unknown time  . sennosides-docusate sodium (SENOKOT-S) 8.6-50 MG tablet Take 1 tablet by mouth 2 (two) times daily.   08/07/2015 at Unknown time  . sitaGLIPtin (JANUVIA) 50 MG tablet Take 1 tablet (50 mg total) by mouth daily. 30 tablet 1 08/07/2015 at Unknown time  . tamsulosin (FLOMAX) 0.4 MG CAPS capsule Take 1 capsule (0.4 mg total) by mouth daily. 30 capsule 1 08/07/2015 at Unknown time   Scheduled:  . albumin human      . cefTRIAXone (ROCEPHIN)  IV  1 g Intravenous Q24H  . feeding supplement (ENSURE  ENLIVE)  237 mL Oral TID BM  . heparin  5,000 Units Subcutaneous 3 times per day  . methocarbamol  500 mg Oral QID  . rifampin  300 mg Oral Daily  . senna-docusate  1 tablet Oral QHS  . vancomycin  1,000 mg Intravenous Q12H    Assessment: 65 yo male with infected R hip s/p I&D (repaired on 2/1 and noted on lovenox  sq q24hr PTA). Pharmacy has been consulted to dose coumadin for VTE prophylaxis. -INR= 1.39, hg= 7.1 (post-op anemia), plt= 332  Goal of Therapy:  INR 2-3 Monitor platelets by anticoagulation protocol: Yes   Plan:  -Coumadin  po today -Daily PT/INR -Will begin education process  Harland German,  Ilda Basset D 08/08/2015 12:27 PM  Addendum -patient noted on rifampin. This will present a complication with coumadin therapy (increases coumadin requirements and getting patients to goal can be difficult. -apixiban and rivaroxaban should also be avoided with concurrent rifampin  -Discussed with Dr. August Saucer. Will plan for lovenox for VTE prophylaxis  Plan -Lovenox  sq q24hr (first dose in am) -CBC q3 days (next in am)  Harland German, Pharm D 08/08/2015 12:46 PM

## 2015-08-08 NOTE — Consult Note (Addendum)
[name]  Date of Admission:  08/07/2015  Date of Consult:  08/08/2015  Reason for Consult: R hip wound infection, sepsis Referring Physician: Marlou Sa  Impression/Recommendation Sepsis R hip wound infection BCx 2/2 GPC  Add rifampin Check ESR and CRP prior to dc Will most likely need PIC Since this is an early infection, his chances of "cure" are improved.  Repeat BCx  Pyuria Await his UCx Will change to ceftriaxone  DM2 Follow FSG. Will need good control for proper wound healing.   Dr Tommy Medal will f/u in AM  Thank you so much for this interesting consult,   Jeff Ferrell (pager) [phone] [email or web address]  Jeff Ferrell is an 65 y.o. male.  HPI: 65 yo M with DM2 adm to hospital on 2-18 with R femoral neck fracture with repair on 2-1. He returned to hospital with d/c form his wound. In Ed his temp as 99 and his WBC was 14.  He was started on vanco/aztreonam (due to hx of PEN allergy).  He underwent I & D this AM.  WBC improved this AM 11.5.   Past Medical History  Diagnosis Date  . Diabetes mellitus     regulated by diet  . Chronic back pain   . Toe osteomyelitis, left ([place]) 03/31/2015  . Diabetic osteomyelitis ([place]) 03/29/2015  . Diabetic foot ulcer ([place]) 03/29/2015    Past Surgical History  Procedure Laterality Date  . Back surgery      screws & hardware to lower back  . 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 Femur im nail Left 06/18/2015    Procedure: INTRAMEDULLARY (IM) NAIL FEMORAL;  Surgeon: Leandrew Koyanagi, MD;  Location: [facility];  Service: Orthopedics;  Laterality: Left;  . Femur im nail Right 07/21/2015    Procedure: Intramedullary nail femoral;  Surgeon: Leandrew Koyanagi, MD;  Location: [facility];  Service: Orthopedics;  Laterality: Right;     Allergies  Allergen Reactions  . Penicillins Hives  and Rash    Has patient had a PCN reaction causing immediate rash, facial/tongue/throat swelling, SOB or lightheadedness with hypotension: Yes Has patient had a PCN reaction causing severe rash involving mucus membranes or skin necrosis: Yes   Has patient had a PCN reaction that required hospitalization No Has patient had a PCN reaction occurring within the last 10 years: No If all of the above answers are "NO", then may proceed with Cephalosporin use.    Pt states he had a "rash" in the 70s when he got PEN.   Medications:  Scheduled: . [MAR Hold] aztreonam  2 g Intravenous 3 times per day  . [MAR Hold] feeding supplement (ENSURE ENLIVE)  237 mL Oral TID BM  . [MAR Hold] heparin  5,000 Units Subcutaneous 3 times per day  . [MAR Hold] methocarbamol  500 mg Oral QID  . [MAR Hold] senna-docusate  1 tablet Oral QHS  . tranexamic acid (CYKLOKAPRON) topical -INTRAOP  2,000 mg Topical Once  . [MAR Hold] vancomycin  1,000 mg Intravenous Q12H    Abtx:  Anti-infectives    Start     Dose/Rate Route Frequency Ordered Stop   08/08/15 0841  vancomycin (VANCOCIN) powder  Status:  Discontinued       As needed 08/08/15 0841 08/08/15 0938   08/08/15 0840  gentamicin (GARAMYCIN) injection  Status:  Discontinued  As needed 08/08/15 0841 08/08/15 0938   08/08/15 0500  [MAR Hold]  vancomycin (VANCOCIN) IVPB 1000 mg/200 mL premix     (MAR Hold since 08/08/15 0752)   1,000 mg 200 mL/hr over 60 Minutes Intravenous Every 12 hours 08/07/15 1552     08/08/15 0100  vancomycin (VANCOCIN) IVPB 1000 mg/200 mL premix  Status:  Discontinued     1,000 mg 200 mL/hr over 60 Minutes Intravenous Every 12 hours 08/07/15 1219 08/07/15 1552   08/07/15 2000  [MAR Hold]  aztreonam (AZACTAM) 2 g in dextrose 5 % 50 mL IVPB     (MAR Hold since 08/08/15 0752)   2 g 100 mL/hr over 30 Minutes Intravenous 3 times per day 08/07/15 1219     08/07/15 1600  vancomycin (VANCOCIN) 1,500 mg in sodium chloride 0.9 % 500 mL IVPB       1,500 mg 250 mL/hr over 120 Minutes Intravenous  Once 08/07/15 1551 08/07/15 1858   08/07/15 1130  vancomycin (VANCOCIN) 1,500 mg in sodium chloride 0.9 % 500 mL IVPB     1,500 mg 250 mL/hr over 120 Minutes Intravenous  Once 08/07/15 1054 08/07/15 1615   08/07/15 1100  aztreonam (AZACTAM) 2 g in dextrose 5 % 50 mL IVPB     2 g 100 mL/hr over 30 Minutes Intravenous  Once 08/07/15 1054 08/07/15 1248      Total days of antibiotics: 1 vanco/aztreonam          Social History:  reports that he has never smoked. He does not have any smokeless tobacco history on file. He reports that he drinks about 3.6 oz of alcohol per week. He reports that he does not use illicit drugs.  No family history on file.  General ROS: no SOB, no CP, denies pain, no dysuria, +frequency, +neuropathy, nl BM, 12 point ROS o/w nl, see HPI   Blood pressure 85/50, pulse 113, temperature 99.1 F (37.3 C), temperature source Oral, resp. rate 18, SpO2 98 %. General appearance: alert, cooperative, fatigued, no distress and seen in PACU Eyes: negative findings: EOMI, pupils irregular Throat: normal findings: oropharynx pink & moist without lesions or evidence of thrush Neck: no adenopathy and supple, symmetrical, trachea midline Lungs: clear to auscultation bilaterally Heart: regular rate and rhythm Abdomen: normal findings: bowel sounds normal and soft, non-tender Extremities: edema none and R hip with VAC in place. significant Dm changes on toes, no frank ulcers.  Neurologic: Sensory: decreased light touch BLE   Results for orders placed or performed during the hospital encounter of 08/07/15 (from the past 48 hour(s))  Blood Culture (routine x 2)     Status: None (Preliminary result)   Collection Time: 08/07/15 10:47 AM  Result Value Ref Range   Specimen Description LEFT ANTECUBITAL    Special Requests BOTTLES DRAWN AEROBIC AND ANAEROBIC 5CC    Culture  Setup Time      GRAM POSITIVE COCCI IN CLUSTERS IN BOTH  AEROBIC AND ANAEROBIC BOTTLES CRITICAL RESULT CALLED TO, READ BACK BY AND VERIFIED WITH: T. IRBY RN [id number] GREEN R CONFIRMED BY M. CAMPBELL    Culture TOO YOUNG TO READ    Report Status PENDING   CBG monitoring, ED     Status: Abnormal   Collection Time: 08/07/15 10:50 AM  Result Value Ref Range   Glucose-Capillary 149 (H) 65 - 99 mg/dL  Comprehensive metabolic panel     Status: Abnormal   Collection Time: 08/07/15 10:53 AM  Result Value Ref Range     Sodium 123 (L) 135 - 145 mmol/L   Potassium 4.3 3.5 - 5.1 mmol/L   Chloride 89 (L) 101 - 111 mmol/L   CO2 22 22 - 32 mmol/L   Glucose, Bld 163 (H) 65 - 99 mg/dL   BUN 13 6 - 20 mg/dL   Creatinine, Ser 0.76 0.61 - 1.24 mg/dL   Calcium 9.1 8.9 - 10.3 mg/dL   Total Protein 7.6 6.5 - 8.1 g/dL   Albumin 2.9 (L) 3.5 - 5.0 g/dL   AST 21 15 - 41 U/L   ALT 17 17 - 63 U/L   Alkaline Phosphatase 244 (H) 38 - 126 U/L   Total Bilirubin 1.6 (H) 0.3 - 1.2 mg/dL   GFR calc non Af Amer >60 >60 mL/min   GFR calc Af Amer >60 >60 mL/min    Comment: (NOTE) The eGFR has been calculated using the CKD EPI equation. This calculation has not been validated in all clinical situations. eGFR's persistently <60 mL/min signify possible Chronic Kidney Disease.    Anion gap 12 5 - 15  CBC WITH DIFFERENTIAL     Status: Abnormal   Collection Time: 08/07/15 10:53 AM  Result Value Ref Range   WBC 14.0 (H) 4.0 - 10.5 K/uL   RBC 3.49 (L) 4.22 - 5.81 MIL/uL   Hemoglobin 8.9 (L) 13.0 - 17.0 g/dL   HCT 27.4 (L) 39.0 - 52.0 %   MCV 78.5 78.0 - 100.0 fL   MCH 25.5 (L) 26.0 - 34.0 pg   MCHC 32.5 30.0 - 36.0 g/dL   RDW 14.1 11.5 - 15.5 %   Platelets 464 (H) 150 - 400 K/uL   Neutrophils Relative % 84 %   Neutro Abs 11.8 (H) 1.7 - 7.7 K/uL   Lymphocytes Relative 5 %   Lymphs Abs 0.7 0.7 - 4.0 K/uL   Monocytes Relative 11 %   Monocytes Absolute 1.5 (H) 0.1 - 1.0 K/uL   Eosinophils Relative 0 %   Eosinophils Absolute 0.0 0.0 - 0.7 K/uL   Basophils Relative 0 %    Basophils Absolute 0.0 0.0 - 0.1 K/uL  Protime-INR     Status: Abnormal   Collection Time: 08/07/15 10:53 AM  Result Value Ref Range   Prothrombin Time 17.2 (H) 11.6 - 15.2 seconds   INR 1.39 0.00 - 1.49  Sedimentation rate     Status: Abnormal   Collection Time: 08/07/15 10:53 AM  Result Value Ref Range   Sed Rate 100 (H) 0 - 16 mm/hr  C-reactive protein     Status: Abnormal   Collection Time: 08/07/15 10:53 AM  Result Value Ref Range   CRP 22.8 (H) <1.0 mg/dL  I-stat chem 8, ed     Status: Abnormal   Collection Time: 08/07/15 10:57 AM  Result Value Ref Range   Sodium 126 (L) 135 - 145 mmol/L   Potassium 4.1 3.5 - 5.1 mmol/L   Chloride 90 (L) 101 - 111 mmol/L   BUN 13 6 - 20 mg/dL   Creatinine, Ser 0.60 (L) 0.61 - 1.24 mg/dL   Glucose, Bld 159 (H) 65 - 99 mg/dL   Calcium, Ion 1.17 1.13 - 1.30 mmol/L   TCO2 23 0 - 100 mmol/L   Hemoglobin 10.5 (L) 13.0 - 17.0 g/dL   HCT 31.0 (L) 39.0 - 52.0 %  I-Stat CG4 Lactic Acid, ED  (not at  [facility])     Status: None   Collection Time: 08/07/15 10:58 AM  Result Value Ref Range     Lactic Acid, Venous 1.92 0.5 - 2.0 mmol/L  Blood Culture (routine x 2)     Status: None (Preliminary result)   Collection Time: 08/07/15 11:18 AM  Result Value Ref Range   Specimen Description BLOOD LEFT HAND    Special Requests BOTTLES DRAWN AEROBIC ONLY 10CC    Culture  Setup Time      GRAM POSITIVE COCCI IN CLUSTERS AEROBIC BOTTLE ONLY CRITICAL RESULT CALLED TO, READ BACK BY AND VERIFIED WITH: T. IRBY RN [id number] GREEN R CONFIRMED BY M. CAMPBELL    Culture TOO YOUNG TO READ    Report Status PENDING   Type and screen     Status: None   Collection Time: 08/07/15 11:20 AM  Result Value Ref Range   ABO/RH(D) B POS    Antibody Screen NEG    Sample Expiration 08/10/2015   Urinalysis, Routine w reflex microscopic (not at [facility])     Status: Abnormal   Collection Time: 08/07/15 12:23 PM  Result Value Ref Range   Color, Urine AMBER (A) YELLOW    Comment:  BIOCHEMICALS MAY BE AFFECTED BY COLOR   APPearance CLOUDY (A) CLEAR   Specific Gravity, Urine 1.020 1.005 - 1.030   pH 6.5 5.0 - 8.0   Glucose, UA NEGATIVE NEGATIVE mg/dL   Hgb urine dipstick NEGATIVE NEGATIVE   Bilirubin Urine NEGATIVE NEGATIVE   Ketones, ur NEGATIVE NEGATIVE mg/dL   Protein, ur 30 (A) NEGATIVE mg/dL   Nitrite POSITIVE (A) NEGATIVE   Leukocytes, UA LARGE (A) NEGATIVE  Urine microscopic-add on     Status: Abnormal   Collection Time: 08/07/15 12:23 PM  Result Value Ref Range   Squamous Epithelial / LPF 0-5 (A) NONE SEEN   WBC, UA TOO NUMEROUS TO COUNT 0 - 5 WBC/hpf   RBC / HPF NONE SEEN 0 - 5 RBC/hpf   Bacteria, UA FEW (A) NONE SEEN  I-Stat CG4 Lactic Acid, ED  (not at  [facility])     Status: None   Collection Time: 08/07/15  3:53 PM  Result Value Ref Range   Lactic Acid, Venous 1.53 0.5 - 2.0 mmol/L  CBC     Status: Abnormal   Collection Time: 08/07/15  5:28 PM  Result Value Ref Range   WBC 15.0 (H) 4.0 - 10.5 K/uL   RBC 3.27 (L) 4.22 - 5.81 MIL/uL   Hemoglobin 8.4 (L) 13.0 - 17.0 g/dL   HCT 25.8 (L) 39.0 - 52.0 %   MCV 78.9 78.0 - 100.0 fL   MCH 25.7 (L) 26.0 - 34.0 pg   MCHC 32.6 30.0 - 36.0 g/dL   RDW 14.0 11.5 - 15.5 %   Platelets 453 (H) 150 - 400 K/uL  Creatinine, serum     Status: None   Collection Time: 08/07/15  5:28 PM  Result Value Ref Range   Creatinine, Ser 0.65 0.61 - 1.24 mg/dL   GFR calc non Af Amer >60 >60 mL/min   GFR calc Af Amer >60 >60 mL/min    Comment: (NOTE) The eGFR has been calculated using the CKD EPI equation. This calculation has not been validated in all clinical situations. eGFR's persistently <60 mL/min signify possible Chronic Kidney Disease.   Procalcitonin     Status: None   Collection Time: 08/07/15  5:28 PM  Result Value Ref Range   Procalcitonin 1.41 ng/mL    Comment:        Interpretation: PCT > 0.5 ng/mL and <= 2 ng/mL: Systemic infection (sepsis) is possible,   but other conditions are known to elevate PCT as  well. (NOTE)         ICU PCT Algorithm               Non ICU PCT Algorithm    ----------------------------     ------------------------------         PCT < 0.25 ng/mL                 PCT < 0.1 ng/mL     Stopping of antibiotics            Stopping of antibiotics       strongly encouraged.               strongly encouraged.    ----------------------------     ------------------------------       PCT level decrease by               PCT < 0.25 ng/mL       >= 80% from peak PCT       OR PCT 0.25 - 0.5 ng/mL          Stopping of antibiotics                                             encouraged.     Stopping of antibiotics           encouraged.    ----------------------------     ------------------------------       PCT level decrease by              PCT >= 0.25 ng/mL       < 80% from peak PCT        AND PCT >= 0.5 ng/mL             Continuing antibiotics                                              encouraged.       Continuing antibiotics            encouraged.    ----------------------------     ------------------------------     PCT level increase compared          PCT > 0.5 ng/mL         with peak PCT AND          PCT >= 0.5 ng/mL             Escalation of antibiotics                                          strongly encouraged.      Escalation of antibiotics        strongly encouraged.   Glucose, capillary     Status: Abnormal   Collection Time: 08/07/15 11:47 PM  Result Value Ref Range   Glucose-Capillary 139 (H) 65 - 99 mg/dL   Comment 1 Notify RN   Basic metabolic panel     Status: Abnormal   Collection Time: 08/08/15  3:15 AM  Result Value Ref Range   Sodium 128 (L) 135 - 145  mmol/L   Potassium 3.9 3.5 - 5.1 mmol/L   Chloride 100 (L) 101 - 111 mmol/L   CO2 21 (L) 22 - 32 mmol/L   Glucose, Bld 122 (H) 65 - 99 mg/dL   BUN 8 6 - 20 mg/dL   Creatinine, Ser 0.52 (L) 0.61 - 1.24 mg/dL   Calcium 8.3 (L) 8.9 - 10.3 mg/dL   GFR calc non Af Amer >60 >60 mL/min   GFR calc Af  Amer >60 >60 mL/min    Comment: (NOTE) The eGFR has been calculated using the CKD EPI equation. This calculation has not been validated in all clinical situations. eGFR's persistently <60 mL/min signify possible Chronic Kidney Disease.    Anion gap 7 5 - 15  CBC     Status: Abnormal   Collection Time: 08/08/15  3:15 AM  Result Value Ref Range   WBC 11.5 (H) 4.0 - 10.5 K/uL   RBC 2.79 (L) 4.22 - 5.81 MIL/uL   Hemoglobin 7.1 (L) 13.0 - 17.0 g/dL   HCT 22.0 (L) 39.0 - 52.0 %   MCV 78.9 78.0 - 100.0 fL   MCH 25.4 (L) 26.0 - 34.0 pg   MCHC 32.3 30.0 - 36.0 g/dL   RDW 14.1 11.5 - 15.5 %   Platelets 332 150 - 400 K/uL  Lactic acid, plasma     Status: None   Collection Time: 08/08/15  3:15 AM  Result Value Ref Range   Lactic Acid, Venous 1.1 0.5 - 2.0 mmol/L  Glucose, capillary     Status: Abnormal   Collection Time: 08/08/15  6:39 AM  Result Value Ref Range   Glucose-Capillary 129 (H) 65 - 99 mg/dL   Comment 1 Notify RN       Component Value Date/Time   SDES BLOOD LEFT HAND 08/07/2015 1118   SPECREQUEST BOTTLES DRAWN AEROBIC ONLY 10CC 08/07/2015 1118   CULT TOO YOUNG TO READ 08/07/2015 1118   REPTSTATUS PENDING 08/07/2015 1118   Ct Hip Right Wo Contrast  08/07/2015  CLINICAL DATA:  Infection. Patient post right hip surgery for right femoral neck fracture 07/21/2015, now with drainage from wound and fever and right hip pain. EXAM: CT OF THE RIGHT HIP WITHOUT CONTRAST TECHNIQUE: Multidetector CT imaging of the right hip was performed according to the standard protocol. Multiplanar CT image reconstructions were also generated. COMPARISON:  Right hip CT 07/20/2015 FINDINGS: Femoral nail with lag screw fixation of comminuted right femoral neck fracture. No abnormal lucency about the included hardware. Improved fracture alignment compared to preoperative radiograph. Questionable minimal interval fracture healing with some periosteal new bone about the lesser trochanter. There is skin  thickening and diffuse subcutaneous edema laterally with skin staples in place. Small curvilinear fluid collection just superficial to the vastus lateralis musculature, with indistinct muscle planes laterally. This fluid collection measures up to 12 mm in depth. Heterogeneous enlargement of right gluteus medius musculature with some internal densities, likely sequela of IM nail. Heterogeneous fluid adjacent to the periosteal new bone about the lesser trochanter. Questionable tiny foci of air in the operative bed, axial image 52 and 39. Limited assessment for effusion secondary to streak artifact from surgical hardware. Enlarged right external iliac and inguinal lymph nodes. Diffuse urinary bladder wall thickening. Pelvic and sacral hardware is partially included. IMPRESSION: 1. Post recent fixation of right femoral neck fracture. Diffuse inflammatory change in the subcutaneous tissues most prominent laterally, with small crescentic fluid collection superficial to the vastus lateralis measuring up to 12 mm. Abscess   versus hematoma, abscess favored in the setting of fever and infection. 2. Heterogeneous enlargement of the right gluteus medius musculature, may reflect postoperative hematoma versus infection. 3. Right inguinal and external iliac adenopathy, likely reactive. 4. Diffuse urinary bladder wall thickening, most consistent with urinary tract infection. Electronically Signed   By: Melanie  Ehinger M.D.   On: 08/07/2015 18:32   Dg Chest Port 1 View  08/07/2015  CLINICAL DATA:  Pt has an infected incision on his right hip. Hx of DM. No chest complaints. Pt is a nonsmoker EXAM: PORTABLE CHEST 1 VIEW COMPARISON:  07/02/2015 FINDINGS: Cardiac silhouette is normal in size and configuration. Normal mediastinal and hilar contours. Clear lungs.  No pleural effusion or pneumothorax. Bony thorax is intact. IMPRESSION: No active disease. Electronically Signed   By: David  Ormond M.D.   On: 08/07/2015 11:24   Dg Hip  Port Unilat With Pelvis 1v Right  08/07/2015  CLINICAL DATA:  Right hip fracture, status post operative fixation 2 weeks ago. Surgical incision infection with right hip pain. EXAM: DG HIP (WITH OR WITHOUT PELVIS) 1V PORT RIGHT COMPARISON:  07/18/2015 FINDINGS: Fixation hardware of the lumbar sacral spine and both hips evident. Recent right hip ORIF for an intertrochanteric fracture. Stable hardware and alignment. No definite hardware abnormality or acute osseous finding. Bony pelvis intact. Normal bowel gas pattern. IMPRESSION: No definite acute osseous or hardware abnormality by plain radiography. Electronically Signed   By: M.  Shick M.D.   On: 08/07/2015 11:26   Recent Results (from the past 240 hour(s))  Blood Culture (routine x 2)     Status: None (Preliminary result)   Collection Time: 08/07/15 10:47 AM  Result Value Ref Range Status   Specimen Description LEFT ANTECUBITAL  Final   Special Requests BOTTLES DRAWN AEROBIC AND ANAEROBIC 5CC  Final   Culture  Setup Time   Final    GRAM POSITIVE COCCI IN CLUSTERS IN BOTH AEROBIC AND ANAEROBIC BOTTLES CRITICAL RESULT CALLED TO, READ BACK BY AND VERIFIED WITH: T. IRBY RN [id number] GREEN R CONFIRMED BY M. CAMPBELL    Culture TOO YOUNG TO READ  Final   Report Status PENDING  Incomplete  Blood Culture (routine x 2)     Status: None (Preliminary result)   Collection Time: 08/07/15 11:18 AM  Result Value Ref Range Status   Specimen Description BLOOD LEFT HAND  Final   Special Requests BOTTLES DRAWN AEROBIC ONLY 10CC  Final   Culture  Setup Time   Final    GRAM POSITIVE COCCI IN CLUSTERS AEROBIC BOTTLE ONLY CRITICAL RESULT CALLED TO, READ BACK BY AND VERIFIED WITH: T. IRBY RN [id number] GREEN R CONFIRMED BY M. CAMPBELL    Culture TOO YOUNG TO READ  Final   Report Status PENDING  Incomplete      08/08/2015, 9:56 AM     LOS: 1 day    Records and images were personally reviewed where available.       [place] [facility] Staphylococcus aureus bacteremia   Staphylococcus aureus bacteremia (SAB) is associated with a high rate of complications and mortality.  Specific aspects of clinical management are critical to optimizing the outcome of patients with SAB.  Therefore, the [place] [facility] ([facility]) has initiated an intervention aimed at improving the management of SAB at [place].  To do so, Infectious Diseases physicians are providing an evidence-based consult for the management of all patients with SAB.     Yes No   Comments  Perform follow-up blood cultures (even if the patient is afebrile) to ensure clearance of bacteremia [x] []   Remove vascular catheter and obtain follow-up blood cultures after the removal of the catheter [] []   Perform echocardiography to evaluate for endocarditis (transthoracic ECHO is 40-50% sensitive, TEE is > 90% sensitive) [] [] Please keep in mind, that neither test can definitively EXCLUDE endocarditis, and that should clinical suspicion remain high for endocarditis the patient should then still be treated with an "endocarditis" duration of therapy = 6 weeks  Consult electrophysiologist to evaluate implanted cardiac device (pacemaker, ICD) [] []   Ensure source control [] [] Have all abscesses been drained effectively? Have deep seeded infections (septic joints or osteomyelitis) had appropriate surgical debridement?  Investigate for "metastatic" sites of infection [] [] Does the patient have ANY symptom or physical exam finding that would suggest a deeper infection (back or neck pain that may be suggestive of vertebral osteomyelitis or epidural abscess, muscle pain that could be a symptom of pyomyositis)?  Keep in mind that for deep seeded infections MRI imaging with contrast is preferred rather than other often insensitive tests such as plain x-rays, especially early in a patient's presentation.  Change antibiotic therapy to __________________ []  [] Beta-lactam antibiotics are preferred for MSSA due to higher cure rates.   If on Vancomycin, goal trough should be 15 - 20 mcg/mL  Estimated duration of IV antibiotic therapy:   [] [] Consult case management for probably prolonged outpatient IV antibiotic therapy     

## 2015-08-08 NOTE — Progress Notes (Signed)
Utilization review completed.  

## 2015-08-08 NOTE — Progress Notes (Signed)
   08/08/15 0245  Vitals  BP (!) 83/53 mmHg  BP Location Left Arm  BP Method Manual  Pulse Rate (!) 104  ECG Heart Rate (!) 105  Resp (!) 6  P gunther notified of above  BP as well as pt having 890 in bladder NP ordered 250 bolus and I & O cath x 1. Cath performed per protocol with Lorita Officer NT as witness returned 900 of clear amber color urine pt tolerated well will continue to monitor

## 2015-08-08 NOTE — Progress Notes (Signed)
Lab called blood culture showed gram positive cocci in clusters pt is currently on vancomycin and azactam spoke to pharmacy which said vancomycin covered will continue to monitor.

## 2015-08-08 NOTE — Plan of Care (Signed)
Problem: Activity: Goal: Ability to ambulate and perform ADLs will improve Outcome: Not Progressing Pt is not bending legs at this point

## 2015-08-08 NOTE — Consult Note (Signed)
Patient date that for or 5:00 yesterday which prevented surgery that night He is currently ready for surgery this morning which will be incision and drainage of both incisions possible antibiotic bead placement as well

## 2015-08-08 NOTE — Transfer of Care (Signed)
Immediate Anesthesia Transfer of Care Note  Patient: Jeff Ferrell  Procedure(s) Performed: Procedure(s): IRRIGATION AND DEBRIDEMENT RIGHT HIP WITH INCISIONAL WOUND VAC PLACEMENT (Right)  Patient Location: PACU  Anesthesia Type:General  Level of Consciousness: awake  Airway & Oxygen Therapy: Patient Spontanous Breathing and Patient connected to nasal cannula oxygen  Post-op Assessment: Report given to RN and Post -op Vital signs reviewed and stable  Post vital signs: stable  Last Vitals:  Filed Vitals:   08/08/15 0500 08/08/15 0600  BP: 125/65 108/62  Pulse: 123 126  Temp:    Resp: 18 20    Complications: No apparent anesthesia complications

## 2015-08-08 NOTE — Anesthesia Postprocedure Evaluation (Signed)
Anesthesia Post Note  Patient: Jeff Ferrell  Procedure(s) Performed: Procedure(s) (LRB): IRRIGATION AND DEBRIDEMENT RIGHT HIP WITH INCISIONAL WOUND VAC PLACEMENT (Right)  Patient location during evaluation: PACU Anesthesia Type: General Level of consciousness: sedated Pain management: pain level controlled Vital Signs Assessment: post-procedure vital signs reviewed and stable Respiratory status: spontaneous breathing and respiratory function stable Cardiovascular status: stable Anesthetic complications: no    Last Vitals:  Filed Vitals:   08/08/15 1033 08/08/15 1045  BP: 105/57 107/55  Pulse: 115 115  Temp:    Resp: 22 22    Last Pain:  Filed Vitals:   08/08/15 1054  PainSc: 6                  Amina Menchaca DANIEL

## 2015-08-08 NOTE — Anesthesia Procedure Notes (Signed)
Procedure Name: Intubation Date/Time: 08/08/2015 8:03 AM Performed by: Little Ishikawa L Pre-anesthesia Checklist: Patient identified, Timeout performed, Emergency Drugs available, Suction available and Patient being monitored Patient Re-evaluated:Patient Re-evaluated prior to inductionOxygen Delivery Method: Circle system utilized Preoxygenation: Pre-oxygenation with 100% oxygen Intubation Type: IV induction Ventilation: Mask ventilation without difficulty Laryngoscope Size: Mac and 4 Grade View: Grade I Tube type: Oral Tube size: 7.5 mm Number of attempts: 1 Airway Equipment and Method: Stylet Placement Confirmation: ETT inserted through vocal cords under direct vision,  positive ETCO2 and breath sounds checked- equal and bilateral Secured at: 22 cm Tube secured with: Tape Dental Injury: Teeth and Oropharynx as per pre-operative assessment

## 2015-08-08 NOTE — Anesthesia Preprocedure Evaluation (Addendum)
Anesthesia Evaluation  Patient identified by MRN, date of birth, ID band Patient awake    Reviewed: Allergy & Precautions, H&P , NPO status , Patient's Chart, lab work & pertinent test results  Airway Mallampati: III  TM Distance: >3 FB Neck ROM: Full    Dental no notable dental hx. (+) Poor Dentition, Dental Advisory Given   Pulmonary neg pulmonary ROS,    Pulmonary exam normal breath sounds clear to auscultation       Cardiovascular negative cardio ROS   Rhythm:Regular Rate:Normal     Neuro/Psych negative neurological ROS  negative psych ROS   GI/Hepatic negative GI ROS, Neg liver ROS,   Endo/Other  diabetes, Type 2, Oral Hypoglycemic Agents  Renal/GU negative Renal ROS  negative genitourinary   Musculoskeletal  (+) Arthritis , Osteoarthritis,    Abdominal   Peds  Hematology Possible sepsis on admission   Anesthesia Other Findings   Reproductive/Obstetrics negative OB ROS                           Anesthesia Physical  Anesthesia Plan  ASA: III and emergent  Anesthesia Plan: General   Post-op Pain Management:    Induction: Intravenous  Airway Management Planned: Oral ETT  Additional Equipment:   Intra-op Plan:   Post-operative Plan: Extubation in OR  Informed Consent: I have reviewed the patients History and Physical, chart, labs and discussed the procedure including the risks, benefits and alternatives for the proposed anesthesia with the patient or authorized representative who has indicated his/her understanding and acceptance.   Dental advisory given  Plan Discussed with: CRNA  Anesthesia Plan Comments:       Anesthesia Quick Evaluation

## 2015-08-08 NOTE — Progress Notes (Signed)
[place] TEAM 1 - Stepdown/ICU TEAM PROGRESS NOTE  Jeff Ferrell ZOX:[id number] DOB: Aug 01, 1950 DOA: 08/07/2015 PCP: [name], MD  Admit HPI / Brief Narrative: 65 y.o. male with DM not on insulin, chronic hyponatremia, and recent fracture of the right femoral neck with repair on 2/1 who presented w/ drainage from the wound. He was noted to be tachycardic with a fever of 99 and suspicion of a wound infection.  He was also noted to have a UA that positive for pyuria.  HPI/Subjective: The patient is resting remarkably comfortably in bed at the present time.  He has been experiencing refractory urinary retention since his admission with an initial INR producing over 1 L of urine and a later follow-up bladder scan noting greater than a liter retained urine.  Foley catheter has now been placed producing 1.3 L of urine thus far.  The patient denies chest pain shortness breath fevers or chills.  He reports a moderate amount of pain in his right hip.  Assessment/Plan:  Sepsis w/ gram+ cocci bacteremia - infected R hip surgical wound  -secondary to infected right hip wound  -taken to the OR for I&D 2/19 - care per ID and Ortho   DM2 -CBG currently reasonably controlled - follow w/ SSI  Chronic Hyponatremia -Workup accomplished during last hospital stay concluded hypovolemia was etiology - improving with volume resuscitation - follow   Acute urinary retention  -was a problem during his initial hospitalization for hip surgery - now recurring and requiring foley placement - initiate antispasmotics and follow   Normocytic anemia  -no indication for HD at present, but threshold is <7.0   MRSA screen +  Code Status: FULL Family Communication: no family present at time of exam Disposition Plan: SDU   Consultants: ID Ortho  Procedures: 2/19 - I&D infected R hip wound   Antibiotics: Aztreonam 2/18 Vancomycin 2/18 > Ceftriaxone 2/19 >  DVT prophylaxis: lovenox    Objective: Blood pressure 87/52, pulse 97, temperature 100.5 F (38.1 C), temperature source Oral, resp. rate 21, SpO2 100 %.  Intake/Output Summary (Last 24 hours) at 08/08/15 1404 Last data filed at 08/08/15 1355  Gross per 24 hour  Intake 4395.83 ml  Output   2450 ml  Net 1945.83 ml   Exam: General: No acute respiratory distress Lungs: Clear to auscultation bilaterally without wheezes or crackles Cardiovascular: Regular rate and rhythm without murmur gallop or rub normal S1 and S2 Abdomen: Nontender, nondistended, soft, bowel sounds positive, no rebound, no ascites, no appreciable mass Extremities: No significant cyanosis, clubbing, or edema bilateral lower extremities  Data Reviewed:  Basic Metabolic Panel:  Recent Labs Lab 08/07/15 1053 08/07/15 1057 08/07/15 1728 08/08/15 0315  NA 123* 126*  --  128*  K 4.3 4.1  --  3.9  CL 89* 90*  --  100*  CO2 22  --   --  21*  GLUCOSE 163* 159*  --  122*  BUN 13 13  --  8  CREATININE 0.76 0.60* 0.65 0.52*  CALCIUM 9.1  --   --  8.3*    CBC:  Recent Labs Lab 08/07/15 1053 08/07/15 1057 08/07/15 1728 08/08/15 0315  WBC 14.0*  --  15.0* 11.5*  NEUTROABS 11.8*  --   --   --   HGB 8.9* 10.5* 8.4* 7.1*  HCT 27.4* 31.0* 25.8* 22.0*  MCV 78.5  --  78.9 78.9  PLT 464*  --  453* 332    Liver Function Tests:  Recent Labs Lab  08/07/15 1053  AST 21  ALT 17  ALKPHOS 244*  BILITOT 1.6*  PROT 7.6  ALBUMIN 2.9*   Coags:  Recent Labs Lab 08/07/15 1053  INR 1.39   CBG:  Recent Labs Lab 08/07/15 1050 08/07/15 2347 08/08/15 0639 08/08/15 0951  GLUCAP 149* 139* 129* 147*    Recent Results (from the past 240 hour(s))  Blood Culture (routine x 2)     Status: None (Preliminary result)   Collection Time: 08/07/15 10:47 AM  Result Value Ref Range Status   Specimen Description LEFT ANTECUBITAL  Final   Special Requests BOTTLES DRAWN AEROBIC AND ANAEROBIC 5CC  Final   Culture  Setup Time   Final    GRAM  POSITIVE COCCI IN CLUSTERS IN BOTH AEROBIC AND ANAEROBIC BOTTLES CRITICAL RESULT CALLED TO, READ BACK BY AND VERIFIED WITH: TKayren Eaves RN [phone] GREEN R CONFIRMED BY M. CAMPBELL    Culture TOO YOUNG TO READ  Final   Report Status PENDING  Incomplete  Blood Culture (routine x 2)     Status: None (Preliminary result)   Collection Time: 08/07/15 11:18 AM  Result Value Ref Range Status   Specimen Description BLOOD LEFT HAND  Final   Special Requests BOTTLES DRAWN AEROBIC ONLY 10CC  Final   Culture  Setup Time   Final    GRAM POSITIVE COCCI IN CLUSTERS AEROBIC BOTTLE ONLY CRITICAL RESULT CALLED TO, READ BACK BY AND VERIFIED WITH: TKayren Eaves RN [phone] GREEN R CONFIRMED BY M. CAMPBELL    Culture TOO YOUNG TO READ  Final   Report Status PENDING  Incomplete  MRSA PCR Screening     Status: Abnormal   Collection Time: 08/08/15  5:32 AM  Result Value Ref Range Status   MRSA by PCR POSITIVE (A) NEGATIVE Final    Comment:        The GeneXpert MRSA Assay (FDA approved for NASAL specimens only), is one component of a comprehensive MRSA colonization surveillance program. It is not intended to diagnose MRSA infection nor to guide or monitor treatment for MRSA infections. RESULT CALLED TO, READ BACK BY AND VERIFIED WITH: RN GORDON,R AT 1002 [id number] MARTINB      Studies:   Recent x-ray studies have been reviewed in detail by the Attending Physician  Scheduled Meds:  Scheduled Meds: . albumin human      . cefTRIAXone (ROCEPHIN)  IV  1 g Intravenous Q24H  . [START ON 08/09/2015] enoxaparin (LOVENOX) injection  40 mg Subcutaneous Q24H  . feeding supplement (ENSURE ENLIVE)  237 mL Oral TID BM  . methocarbamol  500 mg Oral QID  . rifampin  300 mg Oral Daily  . senna-docusate  1 tablet Oral QHS  . vancomycin  1,000 mg Intravenous Q12H    Time spent on care of this patient: 35 mins   MCCLUNG,JEFFREY T , MD   [facility] Office  [phone] Pager - Text Page per Loretha Stapler  as per below:  On-Call/Text Page:      [email or web address]      password [email or web address]  If 7PM-7AM, please contact night-coverage [email or web address] Password [email or web address] 08/08/2015, 2:04 PM   LOS: 1 day

## 2015-08-09 ENCOUNTER — Encounter (HOSPITAL_COMMUNITY): Payer: Self-pay | Admitting: Orthopedic Surgery

## 2015-08-09 DIAGNOSIS — T847XXA Infection and inflammatory reaction due to other internal orthopedic prosthetic devices, implants and grafts, initial encounter: Secondary | ICD-10-CM | POA: Diagnosis present

## 2015-08-09 DIAGNOSIS — M869 Osteomyelitis, unspecified: Secondary | ICD-10-CM

## 2015-08-09 DIAGNOSIS — S72001C Fracture of unspecified part of neck of right femur, initial encounter for open fracture type IIIA, IIIB, or IIIC: Secondary | ICD-10-CM

## 2015-08-09 DIAGNOSIS — T847XXD Infection and inflammatory reaction due to other internal orthopedic prosthetic devices, implants and grafts, subsequent encounter: Secondary | ICD-10-CM

## 2015-08-09 DIAGNOSIS — A4101 Sepsis due to Methicillin susceptible Staphylococcus aureus: Secondary | ICD-10-CM | POA: Diagnosis present

## 2015-08-09 DIAGNOSIS — R7881 Bacteremia: Secondary | ICD-10-CM | POA: Diagnosis present

## 2015-08-09 DIAGNOSIS — B9562 Methicillin resistant Staphylococcus aureus infection as the cause of diseases classified elsewhere: Secondary | ICD-10-CM

## 2015-08-09 DIAGNOSIS — S72009A Fracture of unspecified part of neck of unspecified femur, initial encounter for closed fracture: Secondary | ICD-10-CM | POA: Diagnosis present

## 2015-08-09 LAB — COMPREHENSIVE METABOLIC PANEL
ALT: 15 U/L — AB (ref 17–63)
AST: 24 U/L (ref 15–41)
Albumin: 2.6 g/dL — ABNORMAL LOW (ref 3.5–5.0)
Alkaline Phosphatase: 163 U/L — ABNORMAL HIGH (ref 38–126)
Anion gap: 12 (ref 5–15)
BILIRUBIN TOTAL: 1.3 mg/dL — AB (ref 0.3–1.2)
CHLORIDE: 94 mmol/L — AB (ref 101–111)
CO2: 21 mmol/L — ABNORMAL LOW (ref 22–32)
CREATININE: 0.62 mg/dL (ref 0.61–1.24)
Calcium: 8.8 mg/dL — ABNORMAL LOW (ref 8.9–10.3)
GFR calc Af Amer: >60 mL/min (ref >60)
Glucose, Bld: 90 mg/dL (ref 65–99)
Potassium: 3.5 mmol/L (ref 3.5–5.1)
Sodium: 127 mmol/L — ABNORMAL LOW (ref 135–145)
Total Protein: 6.1 g/dL — ABNORMAL LOW (ref 6.5–8.1)

## 2015-08-09 LAB — CBC
HCT: 22.2 % — ABNORMAL LOW (ref 39.0–52.0)
HEMATOCRIT: 19.8 % — AB (ref 39.0–52.0)
HEMOGLOBIN: 7.3 g/dL — AB (ref 13.0–17.0)
Hemoglobin: 6.5 g/dL — CL (ref 13.0–17.0)
MCH: 26.2 pg (ref 26.0–34.0)
MCH: 25.3 pg — AB (ref 26.0–34.0)
MCHC: 32.8 g/dL (ref 30.0–36.0)
MCHC: 32.9 g/dL (ref 30.0–36.0)
MCV: 79.8 fL (ref 78.0–100.0)
MCV: 76.8 fL — ABNORMAL LOW (ref 78.0–100.0)
PLATELETS: 287 10*3/uL (ref 150–400)
PLATELETS: 288 10*3/uL (ref 150–400)
RBC: 2.48 MIL/uL — AB (ref 4.22–5.81)
RBC: 2.89 MIL/uL — AB (ref 4.22–5.81)
RDW: 14.2 % (ref 11.5–15.5)
RDW: 13.9 % (ref 11.5–15.5)
WBC: 6.9 10*3/uL (ref 4.0–10.5)
WBC: 7.8 10*3/uL (ref 4.0–10.5)

## 2015-08-09 LAB — GLUCOSE, CAPILLARY
GLUCOSE-CAPILLARY: 139 mg/dL — AB (ref 65–99)
GLUCOSE-CAPILLARY: 99 mg/dL (ref 65–99)
GLUCOSE-CAPILLARY: 105 mg/dL — AB (ref 65–99)
Glucose-Capillary: 112 mg/dL — ABNORMAL HIGH (ref 65–99)

## 2015-08-09 LAB — URINE CULTURE

## 2015-08-09 LAB — PREPARE RBC (CROSSMATCH)

## 2015-08-09 MED ORDER — MORPHINE SULFATE (PF) 2 MG/ML IV SOLN
2.0000 mg | INTRAVENOUS | Status: DC | PRN
  Administered 2015-08-09: 4 mg via INTRAVENOUS
  Administered 2015-08-09: 2 mg via INTRAVENOUS
  Administered 2015-08-09: 4 mg via INTRAVENOUS
  Administered 2015-08-09: 2 mg via INTRAVENOUS
  Administered 2015-08-10 – 2015-08-16 (×32): 4 mg via INTRAVENOUS
  Administered 2015-08-16: 2 mg via INTRAVENOUS
  Administered 2015-08-17: 4 mg via INTRAVENOUS
  Administered 2015-08-17: 2 mg via INTRAVENOUS
  Administered 2015-08-17: 4 mg via INTRAVENOUS
  Administered 2015-08-18 (×3): 2 mg via INTRAVENOUS
  Administered 2015-08-19: 3 mg via INTRAVENOUS
  Administered 2015-08-19 (×2): 4 mg via INTRAVENOUS
  Administered 2015-08-19: 2 mg via INTRAVENOUS
  Administered 2015-08-19 – 2015-08-21 (×5): 4 mg via INTRAVENOUS
  Administered 2015-08-22: 2 mg via INTRAVENOUS
  Administered 2015-08-22 – 2015-08-23 (×3): 4 mg via INTRAVENOUS
  Administered 2015-08-23: 2 mg via INTRAVENOUS
  Filled 2015-08-09 (×2): qty 1
  Filled 2015-08-09 (×6): qty 2
  Filled 2015-08-09: qty 1
  Filled 2015-08-09 (×3): qty 2
  Filled 2015-08-09: qty 1
  Filled 2015-08-09: qty 2
  Filled 2015-08-09 (×2): qty 1
  Filled 2015-08-09 (×5): qty 2
  Filled 2015-08-09: qty 1
  Filled 2015-08-09 (×2): qty 2
  Filled 2015-08-09: qty 1
  Filled 2015-08-09 (×6): qty 2
  Filled 2015-08-09: qty 1
  Filled 2015-08-09 (×17): qty 2
  Filled 2015-08-09: qty 1
  Filled 2015-08-09 (×7): qty 2

## 2015-08-09 MED ORDER — DOCUSATE SODIUM 100 MG PO CAPS
100.0000 mg | ORAL_CAPSULE | Freq: Two times a day (BID) | ORAL | Status: DC
  Administered 2015-08-09 – 2015-08-25 (×17): 100 mg via ORAL
  Filled 2015-08-09 (×27): qty 1

## 2015-08-09 MED ORDER — DEXTROSE 5 % IV SOLN
2.0000 g | INTRAVENOUS | Status: DC
  Filled 2015-08-09: qty 2

## 2015-08-09 MED ORDER — SODIUM CHLORIDE 0.9 % IV SOLN: Freq: Once | INTRAVENOUS | Status: DC

## 2015-08-09 MED ORDER — GABAPENTIN 100 MG PO CAPS
100.0000 mg | ORAL_CAPSULE | Freq: Three times a day (TID) | ORAL | Status: DC
  Administered 2015-08-09 – 2015-08-15 (×17): 100 mg via ORAL
  Filled 2015-08-09 (×18): qty 1

## 2015-08-09 MED ORDER — OXYCODONE HCL 5 MG PO TABS
5.0000 mg | ORAL_TABLET | ORAL | Status: DC | PRN
  Administered 2015-08-09 (×2): 10 mg via ORAL
  Administered 2015-08-09: 5 mg via ORAL
  Administered 2015-08-10 – 2015-08-12 (×13): 10 mg via ORAL
  Administered 2015-08-13: 5 mg via ORAL
  Administered 2015-08-13 – 2015-08-23 (×33): 10 mg via ORAL
  Filled 2015-08-09 (×20): qty 2
  Filled 2015-08-09: qty 1
  Filled 2015-08-09 (×20): qty 2
  Filled 2015-08-09: qty 1
  Filled 2015-08-09 (×8): qty 2

## 2015-08-09 NOTE — Progress Notes (Signed)
[facility] TEAM 1 - Stepdown/ICU TEAM PROGRESS NOTE  Jeff Ferrell JXB:[id number] DOB: 06/19/1951 DOA: 08/07/2015 PCP: [name], MD  Admit HPI / Brief Narrative: 65 y.o. male with DM not on insulin, chronic hyponatremia, and recent fracture of the right femoral neck with repair on 2/1 who presented w/ drainage from the wound. He was noted to be tachycardic with a fever of 99 and suspicion of a wound infection.  He was also noted to have a UA positive for pyuria.  HPI/Subjective: The patient states he is in severe pain this morning.  He reports that his pain medication works initially but then wears off very quickly.  He denies chest pain shortness of breath nausea vomiting or abdominal pain.  Assessment/Plan:  Sepsis w/ MRSA Pyelonephritis - presumed MRSA bacteremia - infected R hip surgical wound -taken to the OR for I&D 2/19 - care per ID and Ortho - extent of additional evaluation to be delineated by ID   DM2 -CBG currently reasonably controlled - follow w/ SSI  Chronic Hyponatremia -Workup accomplished during last hospital stay concluded hypovolemia was etiology - stable at present - follow   Acute urinary retention  -was a problem during his initial hospitalization for hip surgery - now recurring and requiring foley placement - cont antispasmotics and follow - likely presently due to pyelonephritis/cystitis   Normocytic anemia  -transfuse w/ Hgb now < 7.0 - follow up after transfusion   Code Status: FULL Family Communication: no family present at time of exam Disposition Plan: SDU   Consultants: ID Ortho  Procedures: 2/19 - I&D infected R hip wound   Antibiotics: Aztreonam 2/18 Vancomycin 2/18 > Ceftriaxone 2/19 > Rifampin 2/19 >   DVT prophylaxis: lovenox   Objective: Blood pressure 97/54, pulse 107, temperature 100 F (37.8 C), temperature source Oral, resp. rate 14, height 5\' 9"  (1.753 m), weight 73.1 kg (161 lb 2.5 oz), SpO2 100 %.  Intake/Output  Summary (Last 24 hours) at 08/09/15 0956 Last data filed at 08/09/15 0800  Gross per 24 hour  Intake 3506.25 ml  Output   3000 ml  Net 506.25 ml   Exam: General: No acute respiratory distress - alert  Lungs: Clear to auscultation bilaterally without wheezes Cardiovascular: Regular rate and rhythm without murmur gallop or rub  Abdomen: Nontender, nondistended, soft, bowel sounds positive, no rebound, no ascites, no appreciable mass Extremities: No significant cyanosis, clubbing, edema bilateral lower extremities  Data Reviewed:  Basic Metabolic Panel:  Recent Labs Lab 08/07/15 1053 08/07/15 1057 08/07/15 1728 08/08/15 0315 08/09/15 0434  NA 123* 126*  --  128* 127*  K 4.3 4.1  --  3.9 3.5  CL 89* 90*  --  100* 94*  CO2 22  --   --  21* 21*  GLUCOSE 163* 159*  --  122* 90  BUN 13 13  --  8 <5*  CREATININE 0.76 0.60* 0.65 0.52* 0.62  CALCIUM 9.1  --   --  8.3* 8.8*    CBC:  Recent Labs Lab 08/07/15 1053 08/07/15 1057 08/07/15 1728 08/08/15 0315 08/09/15 0434  WBC 14.0*  --  15.0* 11.5* 6.9  NEUTROABS 11.8*  --   --   --   --   HGB 8.9* 10.5* 8.4* 7.1* 6.5*  HCT 27.4* 31.0* 25.8* 22.0* 19.8*  MCV 78.5  --  78.9 78.9 79.8  PLT 464*  --  453* 332 287    Liver Function Tests:  Recent Labs Lab 08/07/15 1053 08/09/15 0434  AST  21 24  ALT 17 15*  ALKPHOS 244* 163*  BILITOT 1.6* 1.3*  PROT 7.6 6.1*  ALBUMIN 2.9* 2.6*   Coags:  Recent Labs Lab 08/07/15 1053  INR 1.39   CBG:  Recent Labs Lab 08/07/15 2347 08/08/15 0639 08/08/15 0951 08/08/15 1609 08/08/15 2110  GLUCAP 139* 129* 147* 140* 127*    Recent Results (from the past 240 hour(s))  Blood Culture (routine x 2)     Status: None (Preliminary result)   Collection Time: 08/07/15 10:47 AM  Result Value Ref Range Status   Specimen Description LEFT ANTECUBITAL  Final   Special Requests BOTTLES DRAWN AEROBIC AND ANAEROBIC 5CC  Final   Culture  Setup Time   Final    GRAM POSITIVE COCCI IN  CLUSTERS IN BOTH AEROBIC AND ANAEROBIC BOTTLES CRITICAL RESULT CALLED TO, READ BACK BY AND VERIFIED WITH: TKayren Eaves RN [phone] GREEN R CONFIRMED BY M. CAMPBELL    Culture TOO YOUNG TO READ  Final   Report Status PENDING  Incomplete  Blood Culture (routine x 2)     Status: None (Preliminary result)   Collection Time: 08/07/15 11:18 AM  Result Value Ref Range Status   Specimen Description BLOOD LEFT HAND  Final   Special Requests BOTTLES DRAWN AEROBIC ONLY 10CC  Final   Culture  Setup Time   Final    GRAM POSITIVE COCCI IN CLUSTERS AEROBIC BOTTLE ONLY CRITICAL RESULT CALLED TO, READ BACK BY AND VERIFIED WITH: TKayren Eaves RN [phone] GREEN R CONFIRMED BY M. CAMPBELL    Culture TOO YOUNG TO READ  Final   Report Status PENDING  Incomplete  Urine culture     Status: None   Collection Time: 08/07/15 12:23 PM  Result Value Ref Range Status   Specimen Description URINE, CATHETERIZED  Final   Special Requests NONE  Final   Culture   Final    >=100,000 COLONIES/mL METHICILLIN RESISTANT STAPHYLOCOCCUS AUREUS   Report Status 08/09/2015 FINAL  Final   Organism ID, Bacteria METHICILLIN RESISTANT STAPHYLOCOCCUS AUREUS  Final      Susceptibility   Methicillin resistant staphylococcus aureus - MIC*    CIPROFLOXACIN >=8 RESISTANT Resistant     GENTAMICIN <=0.5 SENSITIVE Sensitive     NITROFURANTOIN <=16 SENSITIVE Sensitive     OXACILLIN >=4 RESISTANT Resistant     TETRACYCLINE <=1 SENSITIVE Sensitive     VANCOMYCIN 1 SENSITIVE Sensitive     TRIMETH/SULFA >=320 RESISTANT Resistant     CLINDAMYCIN <=0.25 SENSITIVE Sensitive     RIFAMPIN <=0.5 SENSITIVE Sensitive     Inducible Clindamycin NEGATIVE Sensitive     * >=100,000 COLONIES/mL METHICILLIN RESISTANT STAPHYLOCOCCUS AUREUS  MRSA PCR Screening     Status: Abnormal   Collection Time: 08/08/15  5:32 AM  Result Value Ref Range Status   MRSA by PCR POSITIVE (A) NEGATIVE Final    Comment:        The GeneXpert MRSA Assay (FDA approved for  NASAL specimens only), is one component of a comprehensive MRSA colonization surveillance program. It is not intended to diagnose MRSA infection nor to guide or monitor treatment for MRSA infections. RESULT CALLED TO, READ BACK BY AND VERIFIED WITH: RN GORDON,R AT 1002 [id number] MARTINB   Anaerobic culture     Status: None (Preliminary result)   Collection Time: 08/08/15  8:28 AM  Result Value Ref Range Status   Specimen Description WOUND RIGHT LEG  Final   Special Requests POF VANCOMYCIN LOWER RIGHT LEG INCISION PART  A   Final   Gram Stain   Final    ABUNDANT WBC PRESENT, PREDOMINANTLY PMN NO SQUAMOUS EPITHELIAL CELLS SEEN FEW GRAM POSITIVE COCCI IN CLUSTERS IN PAIRS Performed at [facility]    Culture PENDING  Incomplete   Report Status PENDING  Incomplete  Wound culture     Status: None (Preliminary result)   Collection Time: 08/08/15  8:28 AM  Result Value Ref Range Status   Specimen Description WOUND RIGHT LEG  Final   Special Requests POF VANCOMYCIN RIGHT LOWER LEG INCISION PART A  Final   Gram Stain   Final    FEW WBC PRESENT, PREDOMINANTLY PMN NO SQUAMOUS EPITHELIAL CELLS SEEN RARE GRAM POSITIVE COCCI IN PAIRS Performed at [facility]    Culture PENDING  Incomplete   Report Status PENDING  Incomplete  Anaerobic culture     Status: None (Preliminary result)   Collection Time: 08/08/15  8:49 AM  Result Value Ref Range Status   Specimen Description WOUND RIGHT LEG  Final   Special Requests POF VANCOMYCIN PROXIMAL RIGHT LOWER LEG PART B  Final   Gram Stain   Final    ABUNDANT WBC PRESENT, PREDOMINANTLY PMN NO SQUAMOUS EPITHELIAL CELLS SEEN FEW GRAM POSITIVE COCCI IN PAIRS IN CLUSTERS Performed at [facility]    Culture PENDING  Incomplete   Report Status PENDING  Incomplete  Wound culture     Status: None (Preliminary result)   Collection Time: 08/08/15  8:49 AM  Result Value Ref Range Status   Specimen Description WOUND RIGHT LEG   Final   Special Requests POF VANCOMYCIN PROXIMAL RIGHT LEG INCISON PART B  Final   Gram Stain   Final    FEW WBC PRESENT, PREDOMINANTLY PMN NO SQUAMOUS EPITHELIAL CELLS SEEN FEW GRAM POSITIVE COCCI IN PAIRS IN CLUSTERS Performed at [facility]    Culture PENDING  Incomplete   Report Status PENDING  Incomplete  Culture, blood (Routine X 2) w Reflex to ID Panel     Status: None (Preliminary result)   Collection Time: 08/08/15 12:40 PM  Result Value Ref Range Status   Specimen Description BLOOD RIGHT ANTECUBITAL  Final   Special Requests IN PEDIATRIC BOTTLE 2CC  Final   Culture PENDING  Incomplete   Report Status PENDING  Incomplete     Studies:   Recent x-ray studies have been reviewed in detail by the Attending Physician  Scheduled Meds:  Scheduled Meds: . sodium chloride   Intravenous Once  . cefTRIAXone (ROCEPHIN)  IV  1 g Intravenous Q24H  . Chlorhexidine Gluconate Cloth  6 each Topical Q0600  . enoxaparin (LOVENOX) injection  40 mg Subcutaneous Q24H  . feeding supplement (ENSURE ENLIVE)  237 mL Oral TID BM  . insulin aspart  0-5 Units Subcutaneous QHS  . insulin aspart  0-9 Units Subcutaneous TID WC  . methocarbamol  500 mg Oral QID  . mupirocin ointment  1 application Nasal BID  . rifampin  300 mg Oral Daily  . senna-docusate  1 tablet Oral QHS  . tamsulosin  0.4 mg Oral Daily  . vancomycin  1,000 mg Intravenous Q12H    Time spent on care of this patient: 35 mins   Tyri Elmore T , MD   [facility] Office  [phone] Pager - Text Page per Loretha Stapler as per below:  On-Call/Text Page:      [email or web address]      password [email or web address]  If 7PM-7AM, please contact night-coverage [email or web address] Password  [email or web address] 08/09/2015, 9:56 AM   LOS: 2 days

## 2015-08-09 NOTE — Care Management Note (Signed)
Case Management Note  Patient Details  Name: Jeff Ferrell MRN: [id number] Date of Birth: February 22, 1951  Subjective/Objective:   Patient is from [facility], s/p I and D for hip infection Bacteremia, has wound vac in place, plan is to return back to [facility] to finish his rehab at dc , CSW aware.                   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 continue IV vancomycin and rifampin x 8 weeks postop then will need 10 months plus of oral suppressive abx He may need repeat surgery to remove implant   Expected Discharge Date:                  Expected Discharge Plan:  Skilled Nursing Facility  In-House Referral:  Clinical Social Work  Discharge planning Services  CM Consult  Post Acute Care Choice:    Choice offered to:     DME Arranged:    DME Agency:     HH Arranged:    HH Agency:     Status of Service:  Completed, signed off  Medicare Important Message Given:  Yes Date Medicare IM Given:    Medicare IM give by:    Date Additional Medicare IM Given:    Additional Medicare Important Message give by:     If discussed at Long Length of Stay Meetings, dates discussed:    Additional Comments:  Leone Haven, RN 08/09/2015, 1:23 PM

## 2015-08-09 NOTE — Care Management Important Message (Signed)
Important Message  Patient Details  Name: Jeff Ferrell MRN: [id number] Date of Birth: February 17, 1951   Medicare Important Message Given:  Yes    Leone Haven, RN 08/09/2015, 11:35 AMImportant Message  Patient Details  Name: Jeff Ferrell MRN: [id number] Date of Birth: 1950-07-08   Medicare Important Message Given:  Yes    Leone Haven, RN 08/09/2015, 11:35 AM

## 2015-08-09 NOTE — Progress Notes (Signed)
PT Cancellation Note  Patient Details Name: Jeff Ferrell MRN: [id number] DOB: August 02, 1950   Cancelled Treatment:    Reason Eval/Treat Not Completed: Patient not medically ready.  Pt c/o constant pain that is causing him to be restless. RN requests PT to return to attempt to see tomorrow in hopes of improvement.  Michail Jewels PT, DPT [phone] Pager: [phone] 08/09/2015, 2:57 PM

## 2015-08-09 NOTE — Progress Notes (Signed)
Pharmacy Antibiotic Note  Jeff Ferrell is a 65 y.o. male admitted on 08/07/2015 with infected R hip s/p I&D and antibiotic bead placement on 2/19, GPC bacteremia, and MRSA UTI.  Pharmacy has been consulted for Vancomycin dosing.  Plan: Vancomycin 1 gm IV every 12 hours.  Goal trough 15-20 mcg/mL.  Will order Vancomycin trough tomorrow AM. Will adjust Rocephin to 2gm q24h to cover for bacteremia. Follow-up cx data  Height:  (175.3 cm) Weight: 161 lb 2.5 oz (73.1 kg) IBW/kg (Calculated) : 70.7  Temp (24hrs), Avg:99.6 F (37.6 C), Min:97.6 F (36.4 C), Max:100.5 F (38.1 C)   Recent Labs Lab 08/07/15 1053 08/07/15 1057 08/07/15 1058 08/07/15 1553 08/07/15 1728 08/08/15 0315 08/09/15 0434  WBC 14.0*  --   --   --  15.0* 11.5* 6.9  CREATININE 0.76 0.60*  --   --  0.65 0.52* 0.62  LATICACIDVEN  --   --  1.92 1.53  --  1.1  --     Estimated Creatinine Clearance: 92.1 mL/min (by C-G formula based on Cr of 0.62).    Allergies  Allergen Reactions  . Penicillins Hives and Rash    Has patient had a PCN reaction causing immediate rash, facial/tongue/throat swelling, SOB or lightheadedness with hypotension: Yes Has patient had a PCN reaction causing severe rash involving mucus membranes or skin necrosis: Yes   Has patient had a PCN reaction that required hospitalization No Has patient had a PCN reaction occurring within the last 10 years: No If all of the above answers are "NO", then may proceed with Cephalosporin use.     Antimicrobials this admission: 2/19 Rifampin 2/19 Rocephin 2/18 Vanc 2/18 azactam >>2/19  Dose adjustments this admission:   Microbiology results: 2/18 blood x 2 - 2/2 GPC in clusters 2/19 R leg wound - few GPC 2/19 urine cx > 100k MRSA 2/19 blood x 2 - ngtd 2/19 MRSA PCR- pos  Thank you for allowing pharmacy to be a part of this patient's care.  [employer], Pharm.D., BCPS Clinical Pharmacist Pager [phone] 08/09/2015 10:04 AM

## 2015-08-09 NOTE — Progress Notes (Signed)
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   Antibiotics:  Anti-infectives    Start     Dose/Rate Route Frequency Ordered Stop   08/09/15 1200  cefTRIAXone (ROCEPHIN) 2 g in dextrose 5 % 50 mL IVPB  Status:  Discontinued     2 g 100 mL/hr over 30 Minutes Intravenous Every 24 hours 08/09/15 1006 08/09/15 1140   08/08/15 1200  cefTRIAXone (ROCEPHIN) 1 g in dextrose 5 % 50 mL IVPB  Status:  Discontinued     1 g 100 mL/hr over 30 Minutes Intravenous Every 24 hours 08/08/15 1015 08/09/15 1006   08/08/15 1200  rifampin (RIFADIN) capsule 300 mg     300 mg Oral Daily 08/08/15 1015     08/08/15 0841  vancomycin (VANCOCIN) powder  Status:  Discontinued       As needed 08/08/15 0841 08/08/15 0938   08/08/15 0840  gentamicin (GARAMYCIN) injection  Status:  Discontinued       As needed 08/08/15 0841 08/08/15 0938   08/08/15 0500  vancomycin (VANCOCIN) IVPB 1000 mg/200 mL premix     1,000 mg 200 mL/hr over 60 Minutes Intravenous Every 12 hours 08/07/15 1552     08/08/15 0100  vancomycin (VANCOCIN) IVPB 1000 mg/200 mL premix  Status:  Discontinued     1,000 mg 200 mL/hr over 60 Minutes Intravenous Every 12 hours 08/07/15 1219 08/07/15 1552   08/07/15 2000  [MAR Hold]  aztreonam (AZACTAM) 2 g in dextrose 5 % 50 mL IVPB  Status:  Discontinued     (MAR Hold since 08/08/15 0752)   2 g 100 mL/hr over 30 Minutes Intravenous 3 times per day 08/07/15 1219 08/08/15 1015   08/07/15 1600  vancomycin (VANCOCIN) 1,500 mg in sodium chloride 0.9 % 500 mL IVPB     1,500 mg 250 mL/hr over 120 Minutes Intravenous  Once 08/07/15 1551 08/07/15 1858   08/07/15 1130  vancomycin (VANCOCIN) 1,500 mg in sodium chloride 0.9 % 500 mL IVPB     1,500 mg 250 mL/hr over 120 Minutes Intravenous  Once 08/07/15 1054 08/07/15 1615   08/07/15 1100  aztreonam (AZACTAM) 2 g in dextrose 5 % 50 mL IVPB     2 g 100 mL/hr over 30 Minutes  Intravenous  Once 08/07/15 1054 08/07/15 1248      Medications: Scheduled Meds: . sodium chloride   Intravenous Once  . Chlorhexidine Gluconate Cloth  6 each Topical Q0600  . docusate sodium  100 mg Oral BID  . enoxaparin (LOVENOX) injection  40 mg Subcutaneous Q24H  . feeding supplement (ENSURE ENLIVE)  237 mL Oral TID BM  . gabapentin  100 mg Oral TID  . insulin aspart  0-5 Units Subcutaneous QHS  . insulin aspart  0-9 Units Subcutaneous TID WC  . methocarbamol  500 mg Oral QID  . mupirocin ointment  1 application Nasal BID  . rifampin  300 mg Oral Daily  . tamsulosin  0.4 mg Oral Daily  . vancomycin  1,000 mg Intravenous Q12H   Continuous Infusions: . sodium chloride 125 mL/hr at 08/09/15 0600   PRN Meds:.acetaminophen **OR** acetaminophen, alum & mag hydroxide-simeth, metoCLOPramide **OR** metoCLOPramide (REGLAN) injection, morphine injection, ondansetron **OR** ondansetron (ZOFRAN) IV, oxyCODONE    Objective: Weight change:   Intake/Output Summary (Last 24 hours) at 08/09/15 1145 Last data filed at 08/09/15 1144  Gross per  24 hour  Intake   2905 ml  Output   3450 ml  Net   -545 ml   Blood pressure 97/54, pulse 107, temperature 98.1 F (36.7 C), temperature source Oral, resp. rate 14, height 5\' 9"  (1.753 m), weight 161 lb 2.5 oz (73.1 kg), SpO2 100 %. Temp:  [97.6 F (36.4 C)-100.4 F (38 C)] 98.1 F (36.7 C) (02/20 1137) Pulse Rate:  [97-124] 107 (02/20 0821) Resp:  [14-24] 14 (02/20 0821) BP: (74-115)/(47-59) 97/54 mmHg (02/20 0821) SpO2:  [97 %-100 %] 100 % (02/20 0821) Weight:  [161 lb 2.5 oz (73.1 kg)] 161 lb 2.5 oz (73.1 kg) (02/20 0255)  Physical Exam: General: Alert and awake, oriented x3, not in any acute distress. HEENT: anicteric sclera, EOMI CVS regular rate, normal r,  no murmur rubs or gallops Chest: clear to auscultation bilaterally, no wheezing, rales or rhonchi Abdomen: soft nontender, nondistended, normal bowel sounds, Extremities:skin:  bandage in place Neuro: nonfocal  CBC:  CBC Latest Ref Rng 08/09/2015 08/08/2015 08/07/2015  WBC 4.0 - 10.5 K/uL 6.9 11.5(H) 15.0(H)  Hemoglobin 13.0 - 17.0 g/dL 6.5(LL) 7.1(L) 8.4(L)  Hematocrit 39.0 - 52.0 % 19.8(L) 22.0(L) 25.8(L)  Platelets 150 - 400 K/uL 287 332 453(H)       BMET  Recent Labs  08/08/15 0315 08/09/15 0434  NA 128* 127*  K 3.9 3.5  CL 100* 94*  CO2 21* 21*  GLUCOSE 122* 90  BUN 8 <5*  CREATININE 0.52* 0.62  CALCIUM 8.3* 8.8*     Liver Panel   Recent Labs  08/07/15 1053 08/09/15 0434  PROT 7.6 6.1*  ALBUMIN 2.9* 2.6*  AST 21 24  ALT 17 15*  ALKPHOS 244* 163*  BILITOT 1.6* 1.3*       Sedimentation Rate  Recent Labs  08/07/15 1053  ESRSEDRATE 100*   C-Reactive Protein  Recent Labs  08/07/15 1053  CRP 22.8*    Micro Results: Recent Results (from the past 720 hour(s))  Surgical pcr screen     Status: Abnormal   Collection Time: 07/19/15  8:55 AM  Result Value Ref Range Status   MRSA, PCR POSITIVE (A) NEGATIVE Final    Comment: RESULT CALLED TO, READ BACK BY AND VERIFIED WITH: AMY FARGO,RN [id number] @ 1417 BY J SCOTTON    Staphylococcus aureus POSITIVE (A) NEGATIVE Final    Comment:        The Xpert SA Assay (FDA approved for NASAL specimens in patients over 56 years of age), is one component of a comprehensive surveillance program.  Test performance has been validated by [facility] for patients greater than or equal to 65 year old. It is not intended to diagnose infection nor to guide or monitor treatment. RESULT CALLED TO, READ BACK BY AND VERIFIED WITH: AMY FARGO,RN [id number] @ 1417 BY J SCOTTON   Blood Culture (routine x 2)     Status: None (Preliminary result)   Collection Time: 08/07/15 10:47 AM  Result Value Ref Range Status   Specimen Description LEFT ANTECUBITAL  Final   Special Requests BOTTLES DRAWN AEROBIC AND ANAEROBIC 5CC  Final   Culture  Setup Time   Final    GRAM POSITIVE COCCI IN CLUSTERS IN BOTH  AEROBIC AND ANAEROBIC BOTTLES CRITICAL RESULT CALLED TO, READ BACK BY AND VERIFIED WITH: Lin Landsman RN [phone] GREEN R CONFIRMED BY M. CAMPBELL    Culture   Final    STAPHYLOCOCCUS AUREUS SUSCEPTIBILITIES TO FOLLOW    Report Status PENDING  Incomplete  Wound culture     Status: None (Preliminary result)   Collection Time: 08/07/15 10:53 AM  Result Value Ref Range Status   Specimen Description WOUND RIGHT HIP  Final   Special Requests SKIN DEEP EXCISION   Final   Gram Stain   Final    NO WBC SEEN NO SQUAMOUS EPITHELIAL CELLS SEEN NO ORGANISMS SEEN Performed at [facility]    Culture   Final    ABUNDANT STAPHYLOCOCCUS AUREUS Note: RIFAMPIN AND GENTAMICIN SHOULD NOT BE USED AS SINGLE DRUGS FOR TREATMENT OF STAPH INFECTIONS. Performed at [facility]    Report Status PENDING  Incomplete  Blood Culture (routine x 2)     Status: None (Preliminary result)   Collection Time: 08/07/15 11:18 AM  Result Value Ref Range Status   Specimen Description BLOOD LEFT HAND  Final   Special Requests BOTTLES DRAWN AEROBIC ONLY 10CC  Final   Culture  Setup Time   Final    GRAM POSITIVE COCCI IN CLUSTERS AEROBIC BOTTLE ONLY CRITICAL RESULT CALLED TO, READ BACK BY AND VERIFIED WITH: TKayren Eaves RN [phone] GREEN R CONFIRMED BY M. CAMPBELL    Culture   Final    STAPHYLOCOCCUS AUREUS SUSCEPTIBILITIES TO FOLLOW    Report Status PENDING  Incomplete  Urine culture     Status: None   Collection Time: 08/07/15 12:23 PM  Result Value Ref Range Status   Specimen Description URINE, CATHETERIZED  Final   Special Requests NONE  Final   Culture   Final    >=100,000 COLONIES/mL METHICILLIN RESISTANT STAPHYLOCOCCUS AUREUS   Report Status 08/09/2015 FINAL  Final   Organism ID, Bacteria METHICILLIN RESISTANT STAPHYLOCOCCUS AUREUS  Final      Susceptibility   Methicillin resistant staphylococcus aureus - MIC*    CIPROFLOXACIN >=8 RESISTANT Resistant     GENTAMICIN <=0.5 SENSITIVE  Sensitive     NITROFURANTOIN <=16 SENSITIVE Sensitive     OXACILLIN >=4 RESISTANT Resistant     TETRACYCLINE <=1 SENSITIVE Sensitive     VANCOMYCIN 1 SENSITIVE Sensitive     TRIMETH/SULFA >=320 RESISTANT Resistant     CLINDAMYCIN <=0.25 SENSITIVE Sensitive     RIFAMPIN <=0.5 SENSITIVE Sensitive     Inducible Clindamycin NEGATIVE Sensitive     * >=100,000 COLONIES/mL METHICILLIN RESISTANT STAPHYLOCOCCUS AUREUS  MRSA PCR Screening     Status: Abnormal   Collection Time: 08/08/15  5:32 AM  Result Value Ref Range Status   MRSA by PCR POSITIVE (A) NEGATIVE Final    Comment:        The GeneXpert MRSA Assay (FDA approved for NASAL specimens only), is one component of a comprehensive MRSA colonization surveillance program. It is not intended to diagnose MRSA infection nor to guide or monitor treatment for MRSA infections. RESULT CALLED TO, READ BACK BY AND VERIFIED WITH: RN GORDON,R AT 1002 [phone] MARTINB   Anaerobic culture     Status: None (Preliminary result)   Collection Time: 08/08/15  8:28 AM  Result Value Ref Range Status   Specimen Description WOUND RIGHT LEG  Final   Special Requests POF VANCOMYCIN LOWER RIGHT LEG INCISION PART A   Final   Gram Stain   Final    ABUNDANT WBC PRESENT, PREDOMINANTLY PMN NO SQUAMOUS EPITHELIAL CELLS SEEN FEW GRAM POSITIVE COCCI IN CLUSTERS IN PAIRS Performed at [facility]    Culture PENDING  Incomplete   Report Status PENDING  Incomplete  Wound culture     Status:  None (Preliminary result)   Collection Time: 08/08/15  8:28 AM  Result Value Ref Range Status   Specimen Description WOUND RIGHT LEG  Final   Special Requests POF VANCOMYCIN RIGHT LOWER LEG INCISION PART A  Final   Gram Stain   Final    FEW WBC PRESENT, PREDOMINANTLY PMN NO SQUAMOUS EPITHELIAL CELLS SEEN RARE GRAM POSITIVE COCCI IN PAIRS Performed at [facility]    Culture PENDING  Incomplete   Report Status PENDING  Incomplete  Anaerobic culture      Status: None (Preliminary result)   Collection Time: 08/08/15  8:49 AM  Result Value Ref Range Status   Specimen Description WOUND RIGHT LEG  Final   Special Requests POF VANCOMYCIN PROXIMAL RIGHT LOWER LEG PART B  Final   Gram Stain   Final    ABUNDANT WBC PRESENT, PREDOMINANTLY PMN NO SQUAMOUS EPITHELIAL CELLS SEEN FEW GRAM POSITIVE COCCI IN PAIRS IN CLUSTERS Performed at [facility]    Culture PENDING  Incomplete   Report Status PENDING  Incomplete  Wound culture     Status: None (Preliminary result)   Collection Time: 08/08/15  8:49 AM  Result Value Ref Range Status   Specimen Description WOUND RIGHT LEG  Final   Special Requests POF VANCOMYCIN PROXIMAL RIGHT LEG INCISON PART B  Final   Gram Stain   Final    FEW WBC PRESENT, PREDOMINANTLY PMN NO SQUAMOUS EPITHELIAL CELLS SEEN FEW GRAM POSITIVE COCCI IN PAIRS IN CLUSTERS Performed at [facility]    Culture PENDING  Incomplete   Report Status PENDING  Incomplete  Culture, blood (Routine X 2) w Reflex to ID Panel     Status: None (Preliminary result)   Collection Time: 08/08/15 12:40 PM  Result Value Ref Range Status   Specimen Description BLOOD RIGHT ANTECUBITAL  Final   Special Requests IN PEDIATRIC BOTTLE 2CC  Final   Culture  Setup Time   Final    GRAM POSITIVE COCCI IN CLUSTERS PEDIATRIC BOTTLE CRITICAL RESULT CALLED TO, READ BACK BY AND VERIFIED WITH: W DAVIS,RN AT 1043 08/09/15 BY L BENFIELD    Culture GRAM POSITIVE COCCI  Final   Report Status PENDING  Incomplete    Studies/Results: Ct Hip Right Wo Contrast  08/07/2015  CLINICAL DATA:  Infection. Patient post right hip surgery for right femoral neck fracture 07/21/2015, now with drainage from wound and fever and right hip pain. EXAM: CT OF THE RIGHT HIP WITHOUT CONTRAST TECHNIQUE: Multidetector CT imaging of the right hip was performed according to the standard protocol. Multiplanar CT image reconstructions were also generated. COMPARISON:  Right  hip CT 07/20/2015 FINDINGS: Femoral nail with lag screw fixation of comminuted right femoral neck fracture. No abnormal lucency about the included hardware. Improved fracture alignment compared to preoperative radiograph. Questionable minimal interval fracture healing with some periosteal new bone about the lesser trochanter. There is skin thickening and diffuse subcutaneous edema laterally with skin staples in place. Small curvilinear fluid collection just superficial to the vastus lateralis musculature, with indistinct muscle planes laterally. This fluid collection measures up to 12 mm in depth. Heterogeneous enlargement of right gluteus medius musculature with some internal densities, likely sequela of IM nail. Heterogeneous fluid adjacent to the periosteal new bone about the lesser trochanter. Questionable tiny foci of air in the operative bed, axial image 52 and 39. Limited assessment for effusion secondary to streak artifact from surgical hardware. Enlarged right external iliac and inguinal lymph nodes. Diffuse urinary bladder  wall thickening. Pelvic and sacral hardware is partially included. IMPRESSION: 1. Post recent fixation of right femoral neck fracture. Diffuse inflammatory change in the subcutaneous tissues most prominent laterally, with small crescentic fluid collection superficial to the vastus lateralis measuring up to 12 mm. Abscess versus hematoma, abscess favored in the setting of fever and infection. 2. Heterogeneous enlargement of the right gluteus medius musculature, may reflect postoperative hematoma versus infection. 3. Right inguinal and external iliac adenopathy, likely reactive. 4. Diffuse urinary bladder wall thickening, most consistent with urinary tract infection. Electronically Signed   By: Rubye Oaks M.D.   On: 08/07/2015 18:32      Assessment/Plan:  INTERVAL HISTORY:   07/2015: original cultures ID with MRSA, repeat blood cultures unfortunately PERSISTENTLY POsitive in  1/2     Principal Problem:   Sepsis (HCC) Active Problems:   UTI (lower urinary tract infection)   DM type 2, goal HbA1c < 7% (HCC)   Chronic hyponatremia   Infection    Jeff Ferrell is a 65 y.o. 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  Specific aspects of clinical management are critical to optimizing the outcome of patients with SAB.  Therefore, the [facility] Antimicrobial Management Team [facility]) has initiated an intervention aimed at improving the management of SAB at [facility].  To do so, Infectious Diseases physicians are providing an evidence-based consult for the management of all patients with SAB.     Yes No Comments  Perform follow-up blood cultures (even if the patient is afebrile) to ensure clearance of bacteremia [x]  []  Repeat blood cultures still positive,   Order repeate cultures TODAY  Remove vascular catheter and obtain follow-up blood cultures after the removal of the catheter []  []  DO NOT PLACE PICC OR CENTRAL LINE FOR THE NEXT 4-5 DAYS AND UNTIL WE CAN PROVE HE IS CLEARING HIS BACTEREMIA  Perform echocardiography to evaluate for endocarditis (transthoracic ECHO is 40-50% sensitive, TEE is > 90% sensitive) []  []  Please keep in mind, that neither test can definitively EXCLUDE endocarditis, and that should clinical suspicion remain high for endocarditis the patient should then still be treated with an "endocarditis" duration of therapy = 6 weeks  He needs a TEE  Consult electrophysiologist to evaluate implanted cardiac device (pacemaker, ICD) []  []    Ensure source control []  []  Have all abscesses been drained effectively? Have deep seeded infections (septic joints or osteomyelitis) had appropriate surgical  debridement?  He has had I and D of hip. He still has hardware that may need to be removed but trying to keep it in for now due to his fracture   Investigate for "metastatic" sites of infection []  []  Does the patient have ANY symptom or physical exam finding that would suggest a deeper infection (back or neck pain that may be suggestive of vertebral osteomyelitis or epidural abscess, muscle pain that could be a symptom of pyomyositis)?  Keep in mind that for deep seeded infections MRI imaging with contrast is preferred rather than other often insensitive tests such as plain x-rays, especially early in a patient's presentation.  Change antibiotic therapy to Vancomycin []  []  Beta-lactam antibiotics are preferred for MSSA due to higher cure rates.   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    LOS: 2 days   Acey Lav 08/09/2015, 11:45 AM

## 2015-08-09 NOTE — Progress Notes (Signed)
   Subjective:  Patient reports pain as mild.  No events.  Objective:   VITALS:   Filed Vitals:   08/09/15 0000 08/09/15 0004 08/09/15 0251 08/09/15 0255  BP: 88/47   108/57  Pulse: 115   124  Temp:  100.4 F (38 C) 99.3 F (37.4 C)   TempSrc:  Axillary Oral   Resp: 24   22  Height:     (1.753 m)  Weight:    73.1 kg (161 lb 2.5 oz)  SpO2: 100%   99%    Tachycardic VAC with good seal NVI Mild swelling   Lab Results  Component Value Date   WBC 6.9 08/09/2015   HGB 6.5* 08/09/2015   HCT 19.8* 08/09/2015   MCV 79.8 08/09/2015   PLT 287 08/09/2015     Assessment/Plan:  1 Day Post-Op   - intraop cultures pending - continue broad spectrum abx - recommend ID consult - need to salvage hardware - will plan to keep VAC on until patient is ready for dc - WBAT BLE  Cheral Almas 08/09/2015, 8:01 AM [phone]

## 2015-08-09 NOTE — Progress Notes (Signed)
PT Cancellation Note  Patient Details Name: Jeff Ferrell MRN: [id number] DOB: 11/21/1950   Cancelled Treatment:    Reason Eval/Treat Not Completed: Medical issues which prohibited therapy.  Pt w/ Hgb of 6.5 this morning.  Will attempt to return to see pt this afternoon, schedule allowing.  Michail Jewels PT, DPT [phone] Pager: [phone] 08/09/2015, 9:06 AM

## 2015-08-09 NOTE — Progress Notes (Signed)
      INFECTIOUS DISEASE ATTENDING ADDENDUM:   MAKE SURE WE HAVE NOT PLACED PICC LINE UNTIL WE HAVE HAD 72-96 HOURS NO GROWTH ON THE BLOOD CULTURES DRAWN ON THE 19TH   Acey Lav 08/09/2015, 8:34 AM

## 2015-08-10 DIAGNOSIS — S72001E Fracture of unspecified part of neck of right femur, subsequent encounter for open fracture type I or II with routine healing: Secondary | ICD-10-CM

## 2015-08-10 DIAGNOSIS — E118 Type 2 diabetes mellitus with unspecified complications: Secondary | ICD-10-CM

## 2015-08-10 DIAGNOSIS — E1165 Type 2 diabetes mellitus with hyperglycemia: Secondary | ICD-10-CM

## 2015-08-10 DIAGNOSIS — R338 Other retention of urine: Secondary | ICD-10-CM | POA: Diagnosis present

## 2015-08-10 DIAGNOSIS — IMO0002 Reserved for concepts with insufficient information to code with codable children: Secondary | ICD-10-CM | POA: Diagnosis present

## 2015-08-10 DIAGNOSIS — M5440 Lumbago with sciatica, unspecified side: Secondary | ICD-10-CM

## 2015-08-10 DIAGNOSIS — A4102 Sepsis due to Methicillin resistant Staphylococcus aureus: Secondary | ICD-10-CM | POA: Diagnosis present

## 2015-08-10 LAB — GLUCOSE, CAPILLARY
GLUCOSE-CAPILLARY: 160 mg/dL — AB (ref 65–99)
GLUCOSE-CAPILLARY: 146 mg/dL — AB (ref 65–99)
Glucose-Capillary: 96 mg/dL (ref 65–99)
Glucose-Capillary: 144 mg/dL — ABNORMAL HIGH (ref 65–99)

## 2015-08-10 LAB — TYPE AND SCREEN
ABO/RH(D): B POS
Antibody Screen: NEGATIVE
UNIT DIVISION: 0

## 2015-08-10 LAB — CULTURE, BLOOD (ROUTINE X 2)

## 2015-08-10 LAB — WOUND CULTURE: Gram Stain: NONE SEEN

## 2015-08-10 LAB — COMPREHENSIVE METABOLIC PANEL
ALT: 12 U/L — AB (ref 17–63)
ANION GAP: 10 (ref 5–15)
AST: 20 U/L (ref 15–41)
Albumin: 2.4 g/dL — ABNORMAL LOW (ref 3.5–5.0)
Alkaline Phosphatase: 193 U/L — ABNORMAL HIGH (ref 38–126)
BUN: <5 mg/dL — ABNORMAL LOW (ref 6–20)
CHLORIDE: 94 mmol/L — AB (ref 101–111)
CO2: 24 mmol/L (ref 22–32)
CREATININE: 0.59 mg/dL — AB (ref 0.61–1.24)
Calcium: 8.8 mg/dL — ABNORMAL LOW (ref 8.9–10.3)
GFR calc non Af Amer: >60 mL/min (ref >60)
Glucose, Bld: 90 mg/dL (ref 65–99)
Potassium: 3.2 mmol/L — ABNORMAL LOW (ref 3.5–5.1)
SODIUM: 128 mmol/L — AB (ref 135–145)
Total Bilirubin: 1.6 mg/dL — ABNORMAL HIGH (ref 0.3–1.2)
Total Protein: 5.9 g/dL — ABNORMAL LOW (ref 6.5–8.1)

## 2015-08-10 LAB — CBC
HCT: 23.9 % — ABNORMAL LOW (ref 39.0–52.0)
HEMOGLOBIN: 7.8 g/dL — AB (ref 13.0–17.0)
MCH: 25 pg — AB (ref 26.0–34.0)
MCHC: 32.6 g/dL (ref 30.0–36.0)
MCV: 76.6 fL — ABNORMAL LOW (ref 78.0–100.0)
PLATELETS: 315 10*3/uL (ref 150–400)
RBC: 3.12 MIL/uL — AB (ref 4.22–5.81)
RDW: 14.2 % (ref 11.5–15.5)
WBC: 8.3 10*3/uL (ref 4.0–10.5)

## 2015-08-10 LAB — VANCOMYCIN, TROUGH: VANCOMYCIN TR: 7 ug/mL — AB (ref 10.0–20.0)

## 2015-08-10 LAB — LIPID PANEL
Cholesterol: 60 mg/dL (ref 0–200)
HDL: 11 mg/dL — AB (ref >40)
LDL Cholesterol: 36 mg/dL (ref 0–99)
TRIGLYCERIDES: 66 mg/dL (ref <150)
Total CHOL/HDL Ratio: 5.5 RATIO
VLDL: 13 mg/dL (ref 0–40)

## 2015-08-10 MED ORDER — VANCOMYCIN HCL IN DEXTROSE 1-5 GM/200ML-% IV SOLN
1000.0000 mg | Freq: Three times a day (TID) | INTRAVENOUS | Status: DC
  Administered 2015-08-10 – 2015-08-11 (×5): 1000 mg via INTRAVENOUS
  Filled 2015-08-10 (×6): qty 200

## 2015-08-10 NOTE — Progress Notes (Signed)
Pharmacy Antibiotic Note  Jeff Ferrell is a 65 y.o. male admitted on 08/07/2015 with infected R hip s/p I&D and antibiotic bead placement on 2/19, GPC bacteremia, and MRSA UTI.  Pt on Day #4 of vancomycin. Plan for vancomycin and rifampin x 8 weeks post-op.  Vancomycin trough 7 mcg/ml (subtherapeutic) on 1gm IV q12h. Goal trough 15-20 mcg/ml  Plan: Change Vancomycin to 1gm IV q8h Will f/u vancomycin trough at new Css Will continue to f/u renal function  Height:  (175.3 cm) Weight: 161 lb 2.5 oz (73.1 kg) IBW/kg (Calculated) : 70.7  Temp (24hrs), Avg:99.3 F (37.4 C), Min:98 F (36.7 C), Max:100.2 F (37.9 C)   Recent Labs Lab 08/07/15 1053 08/07/15 1057 08/07/15 1058 08/07/15 1553 08/07/15 1728 08/08/15 0315 08/09/15 0434 08/09/15 1557 08/10/15 0507  WBC 14.0*  --   --   --  15.0* 11.5* 6.9 7.8 8.3  CREATININE 0.76 0.60*  --   --  0.65 0.52* 0.62  --   --   LATICACIDVEN  --   --  1.92 1.53  --  1.1  --   --   --   VANCOTROUGH  --   --   --   --   --   --   --   --  7*    Estimated Creatinine Clearance: 92.1 mL/min (by C-G formula based on Cr of 0.62).    Allergies  Allergen Reactions  . Penicillins Hives and Rash    Has patient had a PCN reaction causing immediate rash, facial/tongue/throat swelling, SOB or lightheadedness with hypotension: Yes Has patient had a PCN reaction causing severe rash involving mucus membranes or skin necrosis: Yes   Has patient had a PCN reaction that required hospitalization No Has patient had a PCN reaction occurring within the last 10 years: No If all of the above answers are "NO", then may proceed with Cephalosporin use.     Antimicrobials this admission: 2/19 Rifampin>> 2/19 Rocephin x1 2/18 Vanc>> 2/18 azactam >>2/19  Dose adjustments this admission: Vancomycin trough 7 mcg/ml (subtherapeutic) on 1gm IV q12h - change to 1gm IV q8h  Microbiology results: 2/18 bld x 2 - SA 2/18 R leg wound - abundant SA 2/19 R leg  wound - few GPC 2/19 urine cx > 100k MRSA 2/19 blood x 2 - ngtd 2/19 MRSA PCR- pos 2/20 Bld x 2 - ngtd  Thank you for allowing pharmacy to be a part of this patient's care.  Christoper Fabian, PharmD, BCPS Clinical pharmacist, pager [phone] 08/10/2015 6:29 AM

## 2015-08-10 NOTE — Progress Notes (Signed)
D/C'd foley per MD order. Marisue Ivan RN

## 2015-08-10 NOTE — Progress Notes (Signed)
[facility] TEAM 1 - Stepdown/ICU TEAM Progress Note  Jeff Ferrell WUJ:[id number] DOB: 02-Apr-1951 DOA: 08/07/2015 PCP: [name], MD  Admit HPI / Brief Narrative: 65 y.o. BM PMHx DM Type 2  not on insulin, Diabetic Foot Ulcer, Chronic Hyponatremia, Left Toe Osteomyelitis, S/P L-spine fusion, Recent fracture Right Femoral neck with repair on 2/1   Presents because of drainage from the wound. He is noted to be tachycardic with a low-grade fever of 99 and a suspicion of a wound infection and therefore is being admitted to the hospital. Orthopedic surgery plans on taking him to the OR for drainage of the wound either today or tomorrow. He is also noted to have a UA that is positive for pyuria. He does not admit to any dysuria, suprapubic pain or increased frequency of micturition.  HPI/Subjective: 2/21  A/O 4, positive right hip pain appropriate for new surgery. Also complains of occasional lower extremity spasms.  Assessment/Plan: Sepsis w/ MRSA Pyelonephritis - presumed MRSA bacteremia - infected R hip surgical wound -taken to the OR for I&D 2/19 - care per ID and Ortho - extent of additional evaluation to be delineated by ID  -TTE pending; will also require TEE when more stable.  DM Type 2 uncontrolled -Hemoglobin A1c pending -CBG currently reasonably controlled - follow w/ SSI  Chronic Hyponatremia -Workup accomplished during last hospital stay concluded hypovolemia was etiology - stable at present - follow   UTI positive MRSA/ Acute urinary retention  -was a problem during his initial hospitalization for hip surgery - now recurring and requiring foley placement - cont antispasmotics and follow - likely presently due to pyelonephritis/cystitis   Normocytic anemia  -transfuse w/ Hgb now < 7.0 - follow up after transfusion    Code Status: FULL Family Communication: no family present at time of exam Disposition Plan: Resolution  sepsis    Consultants: ID Ortho  Procedure/Significant Events: 2/19 - I&D infected Rt hip wound    Culture 2/18 blood left hand positive staph aureus 2/18 urine positive MRSA 2/19 MRSA by PCR positive 2/19 right leg wound 2 positive staph aureus 2/19 blood right AC positive staph aureus 2/19 blood right arm NGTD 2/20 blood right forearm/AC pending    Antibiotics: Aztreonam 2/18 Vancomycin 2/18 > Ceftriaxone 2/19 > Rifampin 2/19 >   DVT prophylaxis: Lovenox   Devices Wound VAC in place right hip   LINES / TUBES:      Continuous Infusions: . sodium chloride 100 mL/hr at 08/10/15 1801    Objective: VITAL SIGNS: Temp: 99 F (37.2 C) (02/21 1920) Temp Source: Oral (02/21 1920) BP: 139/74 mmHg (02/21 1926) Pulse Rate: 118 (02/21 1926) SPO2; FIO2:   Intake/Output Summary (Last 24 hours) at 08/10/15 2014 Last data filed at 08/10/15 1928  Gross per 24 hour  Intake 3761.67 ml  Output   4385 ml  Net -623.33 ml     Exam: General: A/O 4, positive right hip pain appropriate for new surgery, No acute respiratory distress Eyes: Negative headache, negative scleral hemorrhage ENT: Negative Runny nose, negative gingival bleeding, Neck:  Negative scars, masses, torticollis, lymphadenopathy, JVD Lungs: Clear to auscultation bilaterally without wheezes or crackles Cardiovascular: Regular rate and rhythm without murmur gallop or rub normal S1 and S2 Abdomen:negative abdominal pain, nondistended, positive soft, bowel sounds, no rebound, no ascites, no appreciable mass Extremities: bilateral foot cyanosis, however positive DP/PT pulse, left great toe amputated healed well, significant onychomycosis all toes. Right lateral hip incision with wound VAC in place draining serosanguineous fluid.  Hip appropriately swollen/tender to palpation., Psychiatric:  Negative depression, negative anxiety, negative fatigue, negative mania  Neurologic:  Cranial nerves II through  XII intact, tongue/uvula midline, all extremities muscle strength 5/5, sensation intact throughout, negative dysarthria, negative expressive aphasia, negative receptive aphasia.   Data Reviewed: Basic Metabolic Panel:  Recent Labs Lab 08/07/15 1053 08/07/15 1057 08/07/15 1728 08/08/15 0315 08/09/15 0434 08/10/15 0507  NA 123* 126*  --  128* 127* 128*  K 4.3 4.1  --  3.9 3.5 3.2*  CL 89* 90*  --  100* 94* 94*  CO2 22  --   --  21* 21* 24  GLUCOSE 163* 159*  --  122* 90 90  BUN 13 13  --  8 <5* <5*  CREATININE 0.76 0.60* 0.65 0.52* 0.62 0.59*  CALCIUM 9.1  --   --  8.3* 8.8* 8.8*   Liver Function Tests:  Recent Labs Lab 08/07/15 1053 08/09/15 0434 08/10/15 0507  AST 21 24 20   ALT 17 15* 12*  ALKPHOS 244* 163* 193*  BILITOT 1.6* 1.3* 1.6*  PROT 7.6 6.1* 5.9*  ALBUMIN 2.9* 2.6* 2.4*   No results for input(s): LIPASE, AMYLASE in the last 168 hours. No results for input(s): AMMONIA in the last 168 hours. CBC:  Recent Labs Lab 08/07/15 1053  08/07/15 1728 08/08/15 0315 08/09/15 0434 08/09/15 1557 08/10/15 0507  WBC 14.0*  --  15.0* 11.5* 6.9 7.8 8.3  NEUTROABS 11.8*  --   --   --   --   --   --   HGB 8.9*  < > 8.4* 7.1* 6.5* 7.3* 7.8*  HCT 27.4*  < > 25.8* 22.0* 19.8* 22.2* 23.9*  MCV 78.5  --  78.9 78.9 79.8 76.8* 76.6*  PLT 464*  --  453* 332 287 288 315  < > = values in this interval not displayed. Cardiac Enzymes: No results for input(s): CKTOTAL, CKMB, CKMBINDEX, TROPONINI in the last 168 hours. BNP (last 3 results)  Recent Labs  06/11/15 1106 07/02/15 0538  BNP 27.7 34.0    ProBNP (last 3 results) No results for input(s): PROBNP in the last 8760 hours.  CBG:  Recent Labs Lab 08/09/15 1652 08/09/15 2109 08/10/15 0742 08/10/15 1148 08/10/15 1747  GLUCAP 99 105* 96 144* 160*    Recent Results (from the past 240 hour(s))  Blood Culture (routine x 2)     Status: None   Collection Time: 08/07/15 10:47 AM  Result Value Ref Range Status    Specimen Description LEFT ANTECUBITAL  Final   Special Requests BOTTLES DRAWN AEROBIC AND ANAEROBIC 5CC  Final   Culture  Setup Time   Final    GRAM POSITIVE COCCI IN CLUSTERS IN BOTH AEROBIC AND ANAEROBIC BOTTLES CRITICAL RESULT CALLED TO, READ BACK BY AND VERIFIED WITH: Lin Landsman RN [phone] GREEN R CONFIRMED BY M. CAMPBELL    Culture METHICILLIN RESISTANT STAPHYLOCOCCUS AUREUS  Final   Report Status 08/10/2015 FINAL  Final   Organism ID, Bacteria METHICILLIN RESISTANT STAPHYLOCOCCUS AUREUS  Final      Susceptibility   Methicillin resistant staphylococcus aureus - MIC*    CIPROFLOXACIN >=8 RESISTANT Resistant     ERYTHROMYCIN >=8 RESISTANT Resistant     GENTAMICIN <=0.5 SENSITIVE Sensitive     OXACILLIN >=4 RESISTANT Resistant     TETRACYCLINE <=1 SENSITIVE Sensitive     VANCOMYCIN <=0.5 SENSITIVE Sensitive     TRIMETH/SULFA >=320 RESISTANT Resistant     CLINDAMYCIN <=0.25 SENSITIVE Sensitive  RIFAMPIN <=0.5 SENSITIVE Sensitive     Inducible Clindamycin NEGATIVE Sensitive     * METHICILLIN RESISTANT STAPHYLOCOCCUS AUREUS  Wound culture     Status: None   Collection Time: 08/07/15 10:53 AM  Result Value Ref Range Status   Specimen Description WOUND RIGHT HIP  Final   Special Requests SKIN DEEP EXCISION   Final   Gram Stain   Final    NO WBC SEEN NO SQUAMOUS EPITHELIAL CELLS SEEN NO ORGANISMS SEEN Performed at [facility]    Culture   Final    ABUNDANT METHICILLIN RESISTANT STAPHYLOCOCCUS AUREUS Note: RIFAMPIN AND GENTAMICIN SHOULD NOT BE USED AS SINGLE DRUGS FOR TREATMENT OF STAPH INFECTIONS. This organism DOES NOT demonstrate inducible Clindamycin resistance in vitro. CRITICAL RESULT CALLED TO, READ BACK BY AND VERIFIED WITH: WHITNEY DAVIS @  9:10 AM 08/10/15 BY DWEEKS Performed at [facility]    Report Status 08/10/2015 FINAL  Final   Organism ID, Bacteria METHICILLIN RESISTANT STAPHYLOCOCCUS AUREUS  Final      Susceptibility   Methicillin resistant  staphylococcus aureus - MIC*    CLINDAMYCIN <=0.25 SENSITIVE Sensitive     ERYTHROMYCIN >=8 RESISTANT Resistant     GENTAMICIN <=0.5 SENSITIVE Sensitive     LEVOFLOXACIN >=8 RESISTANT Resistant     OXACILLIN >=4 RESISTANT Resistant     RIFAMPIN <=0.5 SENSITIVE Sensitive     TRIMETH/SULFA >=320 RESISTANT Resistant     VANCOMYCIN <=0.5 SENSITIVE Sensitive     TETRACYCLINE <=1 SENSITIVE Sensitive     * ABUNDANT METHICILLIN RESISTANT STAPHYLOCOCCUS AUREUS  Blood Culture (routine x 2)     Status: None   Collection Time: 08/07/15 11:18 AM  Result Value Ref Range Status   Specimen Description BLOOD LEFT HAND  Final   Special Requests BOTTLES DRAWN AEROBIC ONLY 10CC  Final   Culture  Setup Time   Final    GRAM POSITIVE COCCI IN CLUSTERS AEROBIC BOTTLE ONLY CRITICAL RESULT CALLED TO, READ BACK BY AND VERIFIED WITH: T. IRBY RN [phone] GREEN R CONFIRMED BY M. CAMPBELL    Culture   Final    STAPHYLOCOCCUS AUREUS SUSCEPTIBILITIES PERFORMED ON PREVIOUS CULTURE WITHIN THE LAST 5 DAYS.    Report Status 08/10/2015 FINAL  Final  Urine culture     Status: None   Collection Time: 08/07/15 12:23 PM  Result Value Ref Range Status   Specimen Description URINE, CATHETERIZED  Final   Special Requests NONE  Final   Culture   Final    >=100,000 COLONIES/mL METHICILLIN RESISTANT STAPHYLOCOCCUS AUREUS   Report Status 08/09/2015 FINAL  Final   Organism ID, Bacteria METHICILLIN RESISTANT STAPHYLOCOCCUS AUREUS  Final      Susceptibility   Methicillin resistant staphylococcus aureus - MIC*    CIPROFLOXACIN >=8 RESISTANT Resistant     GENTAMICIN <=0.5 SENSITIVE Sensitive     NITROFURANTOIN <=16 SENSITIVE Sensitive     OXACILLIN >=4 RESISTANT Resistant     TETRACYCLINE <=1 SENSITIVE Sensitive     VANCOMYCIN 1 SENSITIVE Sensitive     TRIMETH/SULFA >=320 RESISTANT Resistant     CLINDAMYCIN <=0.25 SENSITIVE Sensitive     RIFAMPIN <=0.5 SENSITIVE Sensitive     Inducible Clindamycin NEGATIVE Sensitive      * >=100,000 COLONIES/mL METHICILLIN RESISTANT STAPHYLOCOCCUS AUREUS  MRSA PCR Screening     Status: Abnormal   Collection Time: 08/08/15  5:32 AM  Result Value Ref Range Status   MRSA by PCR POSITIVE (A) NEGATIVE Final    Comment:  The GeneXpert MRSA Assay (FDA approved for NASAL specimens only), is one component of a comprehensive MRSA colonization surveillance program. It is not intended to diagnose MRSA infection nor to guide or monitor treatment for MRSA infections. RESULT CALLED TO, READ BACK BY AND VERIFIED WITH: RN GORDON,R AT 1002 [id number] MARTINB   Anaerobic culture     Status: None (Preliminary result)   Collection Time: 08/08/15  8:28 AM  Result Value Ref Range Status   Specimen Description WOUND RIGHT LEG  Final   Special Requests POF VANCOMYCIN LOWER RIGHT LEG INCISION PART A   Final   Gram Stain   Final    ABUNDANT WBC PRESENT, PREDOMINANTLY PMN NO SQUAMOUS EPITHELIAL CELLS SEEN FEW GRAM POSITIVE COCCI IN CLUSTERS IN PAIRS Performed at [facility]    Culture   Final    NO ANAEROBES ISOLATED; CULTURE IN PROGRESS FOR 5 DAYS Performed at [facility]    Report Status PENDING  Incomplete  Wound culture     Status: None (Preliminary result)   Collection Time: 08/08/15  8:28 AM  Result Value Ref Range Status   Specimen Description WOUND RIGHT LEG  Final   Special Requests POF VANCOMYCIN RIGHT LOWER LEG INCISION PART A  Final   Gram Stain   Final    FEW WBC PRESENT, PREDOMINANTLY PMN NO SQUAMOUS EPITHELIAL CELLS SEEN RARE GRAM POSITIVE COCCI IN PAIRS Performed at [facility]    Culture   Final    MODERATE STAPHYLOCOCCUS AUREUS Note: RIFAMPIN AND GENTAMICIN SHOULD NOT BE USED AS SINGLE DRUGS FOR TREATMENT OF STAPH INFECTIONS. Performed at [facility]    Report Status PENDING  Incomplete  Anaerobic culture     Status: None (Preliminary result)   Collection Time: 08/08/15  8:49 AM  Result Value Ref Range Status    Specimen Description WOUND RIGHT LEG  Final   Special Requests POF VANCOMYCIN PROXIMAL RIGHT LOWER LEG PART B  Final   Gram Stain   Final    ABUNDANT WBC PRESENT, PREDOMINANTLY PMN NO SQUAMOUS EPITHELIAL CELLS SEEN FEW GRAM POSITIVE COCCI IN PAIRS IN CLUSTERS Performed at [facility]    Culture   Final    NO ANAEROBES ISOLATED; CULTURE IN PROGRESS FOR 5 DAYS Performed at [facility]    Report Status PENDING  Incomplete  Wound culture     Status: None (Preliminary result)   Collection Time: 08/08/15  8:49 AM  Result Value Ref Range Status   Specimen Description WOUND RIGHT LEG  Final   Special Requests POF VANCOMYCIN PROXIMAL RIGHT LEG INCISON PART B  Final   Gram Stain   Final    FEW WBC PRESENT, PREDOMINANTLY PMN NO SQUAMOUS EPITHELIAL CELLS SEEN FEW GRAM POSITIVE COCCI IN PAIRS IN CLUSTERS Performed at [facility]    Culture   Final    MODERATE STAPHYLOCOCCUS AUREUS Note: RIFAMPIN AND GENTAMICIN SHOULD NOT BE USED AS SINGLE DRUGS FOR TREATMENT OF STAPH INFECTIONS. Performed at [facility]    Report Status PENDING  Incomplete  Culture, blood (Routine X 2) w Reflex to ID Panel     Status: None (Preliminary result)   Collection Time: 08/08/15 12:40 PM  Result Value Ref Range Status   Specimen Description BLOOD RIGHT ANTECUBITAL  Final   Special Requests IN PEDIATRIC BOTTLE 2CC  Final   Culture  Setup Time   Final    GRAM POSITIVE COCCI IN CLUSTERS PEDIATRIC BOTTLE CRITICAL RESULT CALLED TO, READ  BACK BY AND VERIFIED WITH: W DAVIS,RN AT 1043 08/09/15 BY L BENFIELD    Culture   Final    STAPHYLOCOCCUS AUREUS SUSCEPTIBILITIES TO FOLLOW    Report Status PENDING  Incomplete  Culture, blood (Routine X 2) w Reflex to ID Panel     Status: None (Preliminary result)   Collection Time: 08/08/15 12:45 PM  Result Value Ref Range Status   Specimen Description BLOOD RIGHT ARM  Final   Special Requests BOTTLES DRAWN AEROBIC ONLY  5CC  Final    Culture NO GROWTH 2 DAYS  Final   Report Status PENDING  Incomplete  Culture, blood (Routine X 2) w Reflex to ID Panel     Status: None (Preliminary result)   Collection Time: 08/09/15 12:09 PM  Result Value Ref Range Status   Specimen Description BLOOD BLOOD RIGHT FOREARM  Final   Special Requests IN PEDIATRIC BOTTLE .3CC  Final   Culture NO GROWTH 1 DAY  Final   Report Status PENDING  Incomplete  Culture, blood (Routine X 2) w Reflex to ID Panel     Status: None (Preliminary result)   Collection Time: 08/09/15 12:15 PM  Result Value Ref Range Status   Specimen Description BLOOD RIGHT ANTECUBITAL  Final   Special Requests BOTTLES DRAWN AEROBIC ONLY 6CC  Final   Culture NO GROWTH 1 DAY  Final   Report Status PENDING  Incomplete     Studies:  Recent x-ray studies have been reviewed in detail by the Attending Physician  Scheduled Meds:  Scheduled Meds: . sodium chloride   Intravenous Once  . Chlorhexidine Gluconate Cloth  6 each Topical Q0600  . docusate sodium  100 mg Oral BID  . enoxaparin (LOVENOX) injection  40 mg Subcutaneous Q24H  . feeding supplement (ENSURE ENLIVE)  237 mL Oral TID BM  . gabapentin  100 mg Oral TID  . insulin aspart  0-5 Units Subcutaneous QHS  . insulin aspart  0-9 Units Subcutaneous TID WC  . methocarbamol  500 mg Oral QID  . mupirocin ointment  1 application Nasal BID  . rifampin  300 mg Oral Daily  . tamsulosin  0.4 mg Oral Daily  . vancomycin  1,000 mg Intravenous Q8H    Time spent on care of this patient: 40 mins   Briannie Gutierrez, Roselind Messier , MD  [facility] Office  [phone] Pager [phone]  On-Call/Text Page:      [email or web address]      password [email or web address]  If 7PM-7AM, please contact night-coverage [email or web address] Password [email or web address] 08/10/2015, 8:14 PM   LOS: 3 days   Care during the described time interval was provided by me .  I have reviewed this patient's available data, including medical history, events of note, physical examination, and  all test results as part of my evaluation. I have personally reviewed and interpreted all radiology studies.   Carolyne Littles, MD [phone] Pager

## 2015-08-10 NOTE — [place] FL2 (Signed)
[place] MEDICAID FL2 LEVEL OF CARE SCREENING TOOL     IDENTIFICATION  Patient Name: Jeff Ferrell Birthdate: June 03, 1951 Sex: male Admission Date (Current Location): 08/07/2015  [facility] and [place] Number:  Producer, television/film/video and Address:  [facility], [address], [place], [place] [address]      Provider Number: [id number]  Attending Physician Name and Address:  Drema Dallas, MD  Relative Name and Phone Number:  Florina Ou [phone])    Current Level of Care: Hospital Recommended Level of Care: Skilled Nursing Facility Prior Approval Number:    Date Approved/Denied:   PASRR Number: [id number]  Discharge Plan: SNF (Facility resident at [facility] )    Current Diagnoses: Patient Active Problem List   Diagnosis Date Noted  . Hip fracture requiring operative repair (HCC)   . MRSA bacteremia   . Staphylococcus aureus bacteremia with sepsis (HCC)   . Osteomyelitis of right hip (HCC)   . Hardware complicating wound infection (HCC)   . Infection 08/08/2015  . Sepsis (HCC) 08/07/2015  . DM type 2, goal HbA1c < 7% (HCC) 08/07/2015  . Chronic hyponatremia 08/07/2015  . Fracture of femoral neck, right, closed 07/19/2015  . Femoral neck fracture, right, closed, initial encounter 07/19/2015  . Protein-calorie malnutrition, severe 07/19/2015  . Leg weakness, bilateral 07/02/2015  . Left leg swelling 07/02/2015  . Left-sided chest wall pain   . Acute blood loss anemia 06/21/2015  . Hyponatremia 06/20/2015  . Femur fracture, left (HCC) 06/17/2015  . Discitis of lumbar region   . Left hip pain   . Hypoglycemia 06/16/2015  . UTI (lower urinary tract infection) 06/11/2015  . Back pain 06/11/2015  . Malnutrition of moderate degree 03/30/2015  . Diabetes mellitus (HCC) 03/29/2015    Orientation RESPIRATION BLADDER Height & Weight     Self, Time, Situation, Place  Normal Continent   Urethral Cath (08/08/15) Weight: 161 lb 2.5 oz (73.1  kg) Height:   (175.3 cm)  BEHAVIORAL SYMPTOMS/MOOD NEUROLOGICAL BOWEL NUTRITION STATUS      Continent Diet (Heart healthy; carb-modified   Fluid consistency: Thin)  AMBULATORY STATUS COMMUNICATION OF NEEDS Skin   Limited Assist Verbally Other (Comment) (Incision: Right Hip  07/21/15 (closed) 08/08/15 (closed)   Inicision: Hip Left  07/02/15 (closed))                       Personal Care Assistance Level of Assistance  Bathing, Feeding Bathing Assistance: Limited assistance Feeding assistance: Independent Dressing Assistance: Limited assistance     Functional Limitations Warehouse manager, Speech Sight Info: Adequate Hearing Info: Adequate Speech Info: Adequate    SPECIAL CARE FACTORS FREQUENCY        PT Frequency:  (Unable to assess) OT Frequency:  (Unable to assess)            Contractures      Additional Factors Info    Code Status Info: FULL CODE  Allergies Info: Penicillins   Insulin Sliding Scale Info: Insulin aspart injection, 0-5 units, subcutaneous, daily at bedtime    Insulin aspart injection, 0-9 units, Subcutaneous, 3x daily with meals    Contact Precautions: MRSA      Current Medications (08/10/2015):  This is the current hospital active medication list Current Facility-Administered Medications  Medication Dose Route Frequency Provider Last Rate Last Dose  . 0.9 %  sodium chloride infusion   Intravenous Continuous Lonia Blood, MD 100 mL/hr at 08/10/15 0913    .  0.9 %  sodium chloride infusion   Intravenous Once Leanne Chang, NP      . acetaminophen (TYLENOL) tablet 650 mg  650 mg Oral Q6H PRN Cammy Copa, MD   650 mg at 08/09/15 2015   Or  . acetaminophen (TYLENOL) suppository 650 mg  650 mg Rectal Q6H PRN Cammy Copa, MD      . alum & mag hydroxide-simeth (MAALOX/MYLANTA) 200-200-20 MG/5ML suspension 30 mL  30 mL Oral Q6H PRN Calvert Cantor, MD      . Chlorhexidine Gluconate Cloth 2 % PADS 6 each  6 each Topical Q0600 Lonia Blood, MD   6 each at 08/10/15 [phone]  . docusate sodium (COLACE) capsule 100 mg  100 mg Oral BID Lonia Blood, MD   100 mg at 08/10/15 0913  . enoxaparin (LOVENOX) injection 40 mg  40 mg Subcutaneous Q24H Cammy Copa, MD   40 mg at 08/10/15 0914  . feeding supplement (ENSURE ENLIVE) (ENSURE ENLIVE) liquid 237 mL  237 mL Oral TID BM Saima Rizwan, MD   237 mL at 08/09/15 1400  . gabapentin (NEURONTIN) capsule 100 mg  100 mg Oral TID Lonia Blood, MD   100 mg at 08/10/15 0913  . insulin aspart (novoLOG) injection 0-5 Units  0-5 Units Subcutaneous QHS Lonia Blood, MD   0 Units at 08/08/15 2200  . insulin aspart (novoLOG) injection 0-9 Units  0-9 Units Subcutaneous TID WC Lonia Blood, MD   1 Units at 08/09/15 1353  . methocarbamol (ROBAXIN) tablet 500 mg  500 mg Oral QID Meredith Pel, NP   500 mg at 08/10/15 0913  . metoCLOPramide (REGLAN) tablet 5-10 mg  5-10 mg Oral Q8H PRN Cammy Copa, MD       Or  . metoCLOPramide (REGLAN) injection 5-10 mg  5-10 mg Intravenous Q8H PRN Cammy Copa, MD      . morphine 2 MG/ML injection 2-4 mg  2-4 mg Intravenous Q2H PRN Lonia Blood, MD   4 mg at 08/10/15 0544  . mupirocin ointment (BACTROBAN) 2 % 1 application  1 application Nasal BID Lonia Blood, MD   1 application at 08/10/15 0801  . ondansetron (ZOFRAN) tablet 4 mg  4 mg Oral Q6H PRN Cammy Copa, MD       Or  . ondansetron [facility]) injection 4 mg  4 mg Intravenous Q6H PRN Cammy Copa, MD      . oxyCODONE (Oxy IR/ROXICODONE) immediate release tablet 5-10 mg  5-10 mg Oral Q3H PRN Lonia Blood, MD   10 mg at 08/10/15 0914  . rifampin (RIFADIN) capsule 300 mg  300 mg Oral Daily Ginnie Smart, MD   300 mg at 08/10/15 0913  . tamsulosin (FLOMAX) capsule 0.4 mg  0.4 mg Oral Daily Lonia Blood, MD   0.4 mg at 08/10/15 0913  . vancomycin (VANCOCIN) IVPB 1000 mg/200 mL premix  1,000 mg Intravenous Q8H Titus Mould, RPH   1,000 mg at  08/10/15 7846     Discharge Medications: Please see discharge summary for a list of discharge medications.  Relevant Imaging Results:  Relevant Lab Results:   Additional Information SSN: [id number]  Baldo Daub, Student-SW [phone]

## 2015-08-10 NOTE — Progress Notes (Signed)
Subjective: He is a complaining of worsening mid back pain in addition to his hip pain  Antibiotics:  Anti-infectives    Start     Dose/Rate Route Frequency Ordered Stop   08/10/15 1000  vancomycin (VANCOCIN) IVPB 1000 mg/200 mL premix     1,000 mg 200 mL/hr over 60 Minutes Intravenous Every 8 hours 08/10/15 0634     08/09/15 1200  cefTRIAXone (ROCEPHIN) 2 g in dextrose 5 % 50 mL IVPB  Status:  Discontinued     2 g 100 mL/hr over 30 Minutes Intravenous Every 24 hours 08/09/15 1006 08/09/15 1140   08/08/15 1200  cefTRIAXone (ROCEPHIN) 1 g in dextrose 5 % 50 mL IVPB  Status:  Discontinued     1 g 100 mL/hr over 30 Minutes Intravenous Every 24 hours 08/08/15 1015 08/09/15 1006   08/08/15 1200  rifampin (RIFADIN) capsule 300 mg     300 mg Oral Daily 08/08/15 1015     08/08/15 0841  vancomycin (VANCOCIN) powder  Status:  Discontinued       As needed 08/08/15 0841 08/08/15 0938   08/08/15 0840  gentamicin (GARAMYCIN) injection  Status:  Discontinued       As needed 08/08/15 0841 08/08/15 0938   08/08/15 0500  vancomycin (VANCOCIN) IVPB 1000 mg/200 mL premix  Status:  Discontinued     1,000 mg 200 mL/hr over 60 Minutes Intravenous Every 12 hours 08/07/15 1552 08/10/15 0634   08/08/15 0100  vancomycin (VANCOCIN) IVPB 1000 mg/200 mL premix  Status:  Discontinued     1,000 mg 200 mL/hr over 60 Minutes Intravenous Every 12 hours 08/07/15 1219 08/07/15 1552   08/07/15 2000  [MAR Hold]  aztreonam (AZACTAM) 2 g in dextrose 5 % 50 mL IVPB  Status:  Discontinued     (MAR Hold since 08/08/15 0752)   2 g 100 mL/hr over 30 Minutes Intravenous 3 times per day 08/07/15 1219 08/08/15 1015   08/07/15 1600  vancomycin (VANCOCIN) 1,500 mg in sodium chloride 0.9 % 500 mL IVPB     1,500 mg 250 mL/hr over 120 Minutes Intravenous  Once 08/07/15 1551 08/07/15 1858   08/07/15 1130  vancomycin (VANCOCIN) 1,500 mg in sodium chloride 0.9 % 500 mL IVPB     1,500 mg 250 mL/hr over 120 Minutes  Intravenous  Once 08/07/15 1054 08/07/15 1615   08/07/15 1100  aztreonam (AZACTAM) 2 g in dextrose 5 % 50 mL IVPB     2 g 100 mL/hr over 30 Minutes Intravenous  Once 08/07/15 1054 08/07/15 1248      Medications: Scheduled Meds: . sodium chloride   Intravenous Once  . Chlorhexidine Gluconate Cloth  6 each Topical Q0600  . docusate sodium  100 mg Oral BID  . enoxaparin (LOVENOX) injection  40 mg Subcutaneous Q24H  . feeding supplement (ENSURE ENLIVE)  237 mL Oral TID BM  . gabapentin  100 mg Oral TID  . insulin aspart  0-5 Units Subcutaneous QHS  . insulin aspart  0-9 Units Subcutaneous TID WC  . methocarbamol  500 mg Oral QID  . mupirocin ointment  1 application Nasal BID  . rifampin  300 mg Oral Daily  . tamsulosin  0.4 mg Oral Daily  . vancomycin  1,000 mg Intravenous Q8H   Continuous Infusions: . sodium chloride 100 mL/hr at 08/10/15 1100   PRN Meds:.acetaminophen **OR** acetaminophen, alum & mag hydroxide-simeth, metoCLOPramide **OR** metoCLOPramide (REGLAN) injection, morphine injection, ondansetron **OR** ondansetron (ZOFRAN)  IV, oxyCODONE    Objective: Weight change:   Intake/Output Summary (Last 24 hours) at 08/10/15 1126 Last data filed at 08/10/15 1100  Gross per 24 hour  Intake   2700 ml  Output   4875 ml  Net  -2175 ml   Blood pressure 122/62, pulse 117, temperature 98.7 F (37.1 C), temperature source Oral, resp. rate 21, height 5\' 9"  (1.753 m), weight 161 lb 2.5 oz (73.1 kg), SpO2 96 %. Temp:  [98 F (36.7 C)-100.2 F (37.9 C)] 98.7 F (37.1 C) (02/21 1040) Pulse Rate:  [101-122] 117 (02/21 0400) Resp:  [21-25] 21 (02/21 0400) BP: (97-134)/(52-85) 122/62 mmHg (02/21 1040) SpO2:  [93 %-97 %] 96 % (02/21 0400)  Physical Exam: General: Alert and awake, oriented x3, not in any acute distress. HEENT: anicteric sclera, EOMI CVS regular rate, normal r,  no murmur rubs or gallops Chest: clear to auscultation bilaterally, no wheezing, rales or  rhonchi Abdomen: soft nontender, nondistended, normal bowel sounds, Extremities:skin: bandage in place, onychomycotic nails but no DFU Neuro: nonfocal  CBC:  CBC Latest Ref Rng 08/10/2015 08/09/2015 08/09/2015  WBC 4.0 - 10.5 K/uL 8.3 7.8 6.9  Hemoglobin 13.0 - 17.0 g/dL 7.8(L) 7.3(L) 6.5(LL)  Hematocrit 39.0 - 52.0 % 23.9(L) 22.2(L) 19.8(L)  Platelets 150 - 400 K/uL 315 288 287       BMET  Recent Labs  08/09/15 0434 08/10/15 0507  NA 127* 128*  K 3.5 3.2*  CL 94* 94*  CO2 21* 24  GLUCOSE 90 90  BUN <5* <5*  CREATININE 0.62 0.59*  CALCIUM 8.8* 8.8*     Liver Panel   Recent Labs  08/09/15 0434 08/10/15 0507  PROT 6.1* 5.9*  ALBUMIN 2.6* 2.4*  AST 24 20  ALT 15* 12*  ALKPHOS 163* 193*  BILITOT 1.3* 1.6*       Sedimentation Rate No results for input(s): ESRSEDRATE in the last 72 hours. C-Reactive Protein No results for input(s): CRP in the last 72 hours.  Micro Results: Recent Results (from the past 720 hour(s))  Surgical pcr screen     Status: Abnormal   Collection Time: 07/19/15  8:55 AM  Result Value Ref Range Status   MRSA, PCR POSITIVE (A) NEGATIVE Final    Comment: RESULT CALLED TO, READ BACK BY AND VERIFIED WITH: AMY FARGO,RN [id number] @ 1417 BY J SCOTTON    Staphylococcus aureus POSITIVE (A) NEGATIVE Final    Comment:        The Xpert SA Assay (FDA approved for NASAL specimens in patients over 69 years of age), is one component of a comprehensive surveillance program.  Test performance has been validated by [facility] for patients greater than or equal to 17 year old. It is not intended to diagnose infection nor to guide or monitor treatment. RESULT CALLED TO, READ BACK BY AND VERIFIED WITH: AMY FARGO,RN [id number] @ 1417 BY J SCOTTON   Blood Culture (routine x 2)     Status: None (Preliminary result)   Collection Time: 08/07/15 10:47 AM  Result Value Ref Range Status   Specimen Description LEFT ANTECUBITAL  Final   Special Requests  BOTTLES DRAWN AEROBIC AND ANAEROBIC 5CC  Final   Culture  Setup Time   Final    GRAM POSITIVE COCCI IN CLUSTERS IN BOTH AEROBIC AND ANAEROBIC BOTTLES CRITICAL RESULT CALLED TO, READ BACK BY AND VERIFIED WITH: Lin Landsman RN [phone] GREEN R CONFIRMED BY M. CAMPBELL    Culture   Final  STAPHYLOCOCCUS AUREUS SUSCEPTIBILITIES TO FOLLOW    Report Status PENDING  Incomplete  Wound culture     Status: None   Collection Time: 08/07/15 10:53 AM  Result Value Ref Range Status   Specimen Description WOUND RIGHT HIP  Final   Special Requests SKIN DEEP EXCISION   Final   Gram Stain   Final    NO WBC SEEN NO SQUAMOUS EPITHELIAL CELLS SEEN NO ORGANISMS SEEN Performed at [facility]    Culture   Final    ABUNDANT METHICILLIN RESISTANT STAPHYLOCOCCUS AUREUS Note: RIFAMPIN AND GENTAMICIN SHOULD NOT BE USED AS SINGLE DRUGS FOR TREATMENT OF STAPH INFECTIONS. This organism DOES NOT demonstrate inducible Clindamycin resistance in vitro. CRITICAL RESULT CALLED TO, READ BACK BY AND VERIFIED WITH: WHITNEY DAVIS @  9:10 AM 08/10/15 BY DWEEKS Performed at [facility]    Report Status 08/10/2015 FINAL  Final   Organism ID, Bacteria METHICILLIN RESISTANT STAPHYLOCOCCUS AUREUS  Final      Susceptibility   Methicillin resistant staphylococcus aureus - MIC*    CLINDAMYCIN <=0.25 SENSITIVE Sensitive     ERYTHROMYCIN >=8 RESISTANT Resistant     GENTAMICIN <=0.5 SENSITIVE Sensitive     LEVOFLOXACIN >=8 RESISTANT Resistant     OXACILLIN >=4 RESISTANT Resistant     RIFAMPIN <=0.5 SENSITIVE Sensitive     TRIMETH/SULFA >=320 RESISTANT Resistant     VANCOMYCIN <=0.5 SENSITIVE Sensitive     TETRACYCLINE <=1 SENSITIVE Sensitive     * ABUNDANT METHICILLIN RESISTANT STAPHYLOCOCCUS AUREUS  Blood Culture (routine x 2)     Status: None (Preliminary result)   Collection Time: 08/07/15 11:18 AM  Result Value Ref Range Status   Specimen Description BLOOD LEFT HAND  Final   Special Requests BOTTLES  DRAWN AEROBIC ONLY 10CC  Final   Culture  Setup Time   Final    GRAM POSITIVE COCCI IN CLUSTERS AEROBIC BOTTLE ONLY CRITICAL RESULT CALLED TO, READ BACK BY AND VERIFIED WITH: TKayren Eaves RN [phone] GREEN R CONFIRMED BY M. CAMPBELL    Culture   Final    STAPHYLOCOCCUS AUREUS SUSCEPTIBILITIES TO FOLLOW    Report Status PENDING  Incomplete  Urine culture     Status: None   Collection Time: 08/07/15 12:23 PM  Result Value Ref Range Status   Specimen Description URINE, CATHETERIZED  Final   Special Requests NONE  Final   Culture   Final    >=100,000 COLONIES/mL METHICILLIN RESISTANT STAPHYLOCOCCUS AUREUS   Report Status 08/09/2015 FINAL  Final   Organism ID, Bacteria METHICILLIN RESISTANT STAPHYLOCOCCUS AUREUS  Final      Susceptibility   Methicillin resistant staphylococcus aureus - MIC*    CIPROFLOXACIN >=8 RESISTANT Resistant     GENTAMICIN <=0.5 SENSITIVE Sensitive     NITROFURANTOIN <=16 SENSITIVE Sensitive     OXACILLIN >=4 RESISTANT Resistant     TETRACYCLINE <=1 SENSITIVE Sensitive     VANCOMYCIN 1 SENSITIVE Sensitive     TRIMETH/SULFA >=320 RESISTANT Resistant     CLINDAMYCIN <=0.25 SENSITIVE Sensitive     RIFAMPIN <=0.5 SENSITIVE Sensitive     Inducible Clindamycin NEGATIVE Sensitive     * >=100,000 COLONIES/mL METHICILLIN RESISTANT STAPHYLOCOCCUS AUREUS  MRSA PCR Screening     Status: Abnormal   Collection Time: 08/08/15  5:32 AM  Result Value Ref Range Status   MRSA by PCR POSITIVE (A) NEGATIVE Final    Comment:        The GeneXpert MRSA Assay (FDA approved for NASAL specimens  only), is one component of a comprehensive MRSA colonization surveillance program. It is not intended to diagnose MRSA infection nor to guide or monitor treatment for MRSA infections. RESULT CALLED TO, READ BACK BY AND VERIFIED WITH: RN GORDON,R AT 1002 [id number] MARTINB   Anaerobic culture     Status: None (Preliminary result)   Collection Time: 08/08/15  8:28 AM  Result Value Ref  Range Status   Specimen Description WOUND RIGHT LEG  Final   Special Requests POF VANCOMYCIN LOWER RIGHT LEG INCISION PART A   Final   Gram Stain   Final    ABUNDANT WBC PRESENT, PREDOMINANTLY PMN NO SQUAMOUS EPITHELIAL CELLS SEEN FEW GRAM POSITIVE COCCI IN CLUSTERS IN PAIRS Performed at [facility]    Culture   Final    NO ANAEROBES ISOLATED; CULTURE IN PROGRESS FOR 5 DAYS Performed at [facility]    Report Status PENDING  Incomplete  Wound culture     Status: None (Preliminary result)   Collection Time: 08/08/15  8:28 AM  Result Value Ref Range Status   Specimen Description WOUND RIGHT LEG  Final   Special Requests POF VANCOMYCIN RIGHT LOWER LEG INCISION PART A  Final   Gram Stain   Final    FEW WBC PRESENT, PREDOMINANTLY PMN NO SQUAMOUS EPITHELIAL CELLS SEEN RARE GRAM POSITIVE COCCI IN PAIRS Performed at [facility]    Culture   Final    MODERATE STAPHYLOCOCCUS AUREUS Note: RIFAMPIN AND GENTAMICIN SHOULD NOT BE USED AS SINGLE DRUGS FOR TREATMENT OF STAPH INFECTIONS. Performed at [facility]    Report Status PENDING  Incomplete  Anaerobic culture     Status: None (Preliminary result)   Collection Time: 08/08/15  8:49 AM  Result Value Ref Range Status   Specimen Description WOUND RIGHT LEG  Final   Special Requests POF VANCOMYCIN PROXIMAL RIGHT LOWER LEG PART B  Final   Gram Stain   Final    ABUNDANT WBC PRESENT, PREDOMINANTLY PMN NO SQUAMOUS EPITHELIAL CELLS SEEN FEW GRAM POSITIVE COCCI IN PAIRS IN CLUSTERS Performed at [facility]    Culture   Final    NO ANAEROBES ISOLATED; CULTURE IN PROGRESS FOR 5 DAYS Performed at [facility]    Report Status PENDING  Incomplete  Wound culture     Status: None (Preliminary result)   Collection Time: 08/08/15  8:49 AM  Result Value Ref Range Status   Specimen Description WOUND RIGHT LEG  Final   Special Requests POF VANCOMYCIN PROXIMAL RIGHT LEG INCISON PART B  Final    Gram Stain   Final    FEW WBC PRESENT, PREDOMINANTLY PMN NO SQUAMOUS EPITHELIAL CELLS SEEN FEW GRAM POSITIVE COCCI IN PAIRS IN CLUSTERS Performed at [facility]    Culture   Final    MODERATE STAPHYLOCOCCUS AUREUS Note: RIFAMPIN AND GENTAMICIN SHOULD NOT BE USED AS SINGLE DRUGS FOR TREATMENT OF STAPH INFECTIONS. Performed at [facility]    Report Status PENDING  Incomplete  Culture, blood (Routine X 2) w Reflex to ID Panel     Status: None (Preliminary result)   Collection Time: 08/08/15 12:40 PM  Result Value Ref Range Status   Specimen Description BLOOD RIGHT ANTECUBITAL  Final   Special Requests IN PEDIATRIC BOTTLE 2CC  Final   Culture  Setup Time   Final    GRAM POSITIVE COCCI IN CLUSTERS PEDIATRIC BOTTLE CRITICAL RESULT CALLED TO, READ BACK BY AND VERIFIED WITH: W DAVIS,RN AT  1043 08/09/15 BY L BENFIELD    Culture GRAM POSITIVE COCCI  Final   Report Status PENDING  Incomplete  Culture, blood (Routine X 2) w Reflex to ID Panel     Status: None (Preliminary result)   Collection Time: 08/08/15 12:45 PM  Result Value Ref Range Status   Specimen Description BLOOD RIGHT ARM  Final   Special Requests BOTTLES DRAWN AEROBIC ONLY  5CC  Final   Culture NO GROWTH < 24 HOURS  Final   Report Status PENDING  Incomplete  Culture, blood (Routine X 2) w Reflex to ID Panel     Status: None (Preliminary result)   Collection Time: 08/09/15 12:09 PM  Result Value Ref Range Status   Specimen Description BLOOD BLOOD RIGHT FOREARM  Final   Special Requests IN PEDIATRIC BOTTLE .3CC  Final   Culture PENDING  Incomplete   Report Status PENDING  Incomplete  Culture, blood (Routine X 2) w Reflex to ID Panel     Status: None (Preliminary result)   Collection Time: 08/09/15 12:15 PM  Result Value Ref Range Status   Specimen Description BLOOD RIGHT ANTECUBITAL  Final   Special Requests BOTTLES DRAWN AEROBIC ONLY 6CC  Final   Culture PENDING  Incomplete   Report Status PENDING   Incomplete    Studies/Results: No results found.    Assessment/Plan:  INTERVAL HISTORY:   07/2015: original cultures ID with MRSA, repeat blood cultures unfortunately PERSISTENTLY POsitive in 1/2     Principal Problem:   Sepsis (HCC) Active Problems:   UTI (lower urinary tract infection)   DM type 2, goal HbA1c < 7% (HCC)   Chronic hyponatremia   Infection   Hip fracture requiring operative repair (HCC)   MRSA bacteremia   Staphylococcus aureus bacteremia with sepsis (HCC)   Osteomyelitis of right hip (HCC)   Hardware complicating wound infection (HCC)    Jeff Ferrell is a 65 y.o. 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  Specific aspects of clinical management are critical to optimizing the outcome of patients with SAB.  Therefore, the [facility] Antimicrobial Management Team [facility]) has initiated an intervention aimed at improving the management of SAB at [facility].  To do so, Infectious Diseases physicians are providing an evidence-based consult for the management of all patients with SAB.     Yes No Comments  Perform follow-up blood cultures (even if the patient is afebrile) to ensure clearance of bacteremia [x]  []  Repeat blood cultures 08/08/15 still positive,   Order repeate cultures 08/09/15  Remove vascular catheter and obtain follow-up blood cultures after the removal of the catheter []  []  DO NOT PLACE PICC OR CENTRAL LINE FOR THE NEXT 4-5 DAYS AND UNTIL WE CAN PROVE HE IS CLEARING HIS BACTEREMIA  Perform echocardiography to evaluate for endocarditis (transthoracic ECHO is 40-50% sensitive, TEE is > 90% sensitive) []  []  Please keep in mind, that neither test can definitively EXCLUDE endocarditis, and that should  clinical suspicion remain high for endocarditis the patient should then still be treated with an "endocarditis" duration of therapy = 6 weeks  He needs a TEE  Consult electrophysiologist to evaluate implanted cardiac device (pacemaker, ICD) []  []    Ensure source control []  []  Have all abscesses been drained effectively? Have  deep seeded infections (septic joints or osteomyelitis) had appropriate surgical debridement?  He has had I and D of hip. He still has hardware that may need to be removed but trying to keep it in for now due to his fracture   Investigate for "metastatic" sites of infection   Does the patient have ANY symptom or physical exam finding that would suggest a deeper infection (back or neck pain that may be suggestive of vertebral osteomyelitis or epidural abscess, muscle pain that could be a symptom of pyomyositis)?  Keep in mind that for deep seeded infections MRI imaging with contrast is preferred rather than other often insensitive tests such as plain x-rays, especially early in a patient's presentation.  I am concerned about his worsening mid back and lower back pain. MRI L spine done in January did not show diskitis  I would recommend obtaining MRI T and L spine with contrast  Change antibiotic therapy to Vancomycin   Beta-lactam antibiotics are preferred for MSSA due to higher cure rates.   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    LOS: 3 days   Acey Lav 08/10/2015, 11:26 AM

## 2015-08-10 NOTE — Clinical Social Work Note (Signed)
Clinical Social Work Assessment  Patient Details  Name: Jeff Ferrell MRN: Jeff Ferrell of Birth: 01-21-1951  Date of referral:  08/10/15               Reason for consult:  Discharge Planning                Permission sought to share information with:  Facility Medical sales representative Permission granted to share information::     Name::     Jeff Ferrell   Agency::     Relationship::  Spouse   Contact Information:  [phone]  Housing/Transportation Living arrangements for the past 2 months:  Skilled Nursing Facility (Patient is a resident at [facility] ) Source of Information:  Patient Patient Interpreter Needed:  None Criminal Activity/Legal Involvement Pertinent to Current Situation/Hospitalization:  No - Comment as needed Significant Relationships:  Spouse Jeff Ferrell ) Lives with:  Facility Resident ([facility]) Do you feel safe going back to the place where you live?  Yes Need for family participation in patient care:  Yes (Comment)  Care giving concerns:  Not discussed    Social Worker assessment / plan:  Patient was awake and alert during CSW visit. Jeff Ferrell was sitting up eating breakfast, and watching television. During the room visit, CSW intern discussed with Jeff Ferrell his discharge planning. Patient stated that he was returning to [facility] once he was ready for discharge. CSW explained that transport will be called once he is cleared for discharge. Jeff Ferrell had no further questions or concerns at this time.   Employment status:  Unemployed Health and safety inspector:  Medicare PT Recommendations:  Not assessed at this time Information / Referral to community resources:  Skilled Nursing Facility  Patient/Family's Response to care:  Not discussed.  Patient/Family's Understanding of and Emotional Response to Diagnosis, Current Treatment, and Prognosis: Not discussed.   Emotional Assessment Appearance:  Appears stated age Attitude/Demeanor/Rapport:     Affect (typically observed):  Accepting, Calm, Appropriate, Hopeful Orientation:  Oriented to Self, Oriented to Place, Oriented to  Time, Oriented to Situation Alcohol / Substance use:  Alcohol Use (Patient stated that he does not smoke. Patient reports that he drinks occasionally. Patient resides in a skilled nursing facility ) Psych involvement (Current and /or in the community):  No (Comment)  Discharge Needs  Concerns to be addressed:  Other (Comment Required Readmission within the last 30 days:  Yes Current discharge risk:  Other (Deconditioned ) Barriers to Discharge:  No Barriers Identified   Jeff Ferrell, Student-SW 08/10/2015, 9:46 AM

## 2015-08-10 NOTE — Evaluation (Signed)
Physical Therapy Evaluation Patient Details Name: Jeff Ferrell MRN: [id number] DOB: [birth year] Today's Date: 08/10/2015   History of Present Illness  Pt is a 65 y/o M w/ recent fx of Rt femoral neck w/ repair on 07/21/15 who presents w/ wound infection, now s/p I&D.  PT w/ MRSA UTI and tachycardia.  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  Clinical Impression  Pt admitted with above diagnosis. Pt currently with functional limitations due to the deficits listed below (see PT Problem List). [place] presents w/ muscle spasms Bil LEs and severe pain, limiting mobility this session.  Mod assist to sit EOB and PROM exercises completed as pt unable to relax to perform AROM. Pt will benefit from skilled PT to increase their independence and safety with mobility to allow discharge to the venue listed below.      Follow Up Recommendations SNF;Supervision/Assistance - 24 hour    Equipment Recommendations  None recommended by PT    Recommendations for Other Services       Precautions / Restrictions Precautions Precautions: Fall Precaution Comments: Wound vac; Bil LE extreme spasms with bil knee flexion maintained throughout, R>L Restrictions Weight Bearing Restrictions: Yes RLE Weight Bearing: Non weight bearing LLE Weight Bearing: Weight bearing as tolerated      Mobility  Bed Mobility Overal bed mobility: Needs Assistance Bed Mobility: Supine to Sit;Sit to Supine     Supine to sit: Mod assist Sit to supine: Mod assist   General bed mobility comments: Assist managing Bil LEs, pt w/ heavy use of bed rail and requires assist to trunk to elevate trunk.    Transfers                 General transfer comment: unable to achieve buttocks off bed, despite physical assist, due to severe Bil LE muscle spasm  Ambulation/Gait                Stairs            Wheelchair Mobility    Modified Rankin (Stroke Patients  Only)       Balance Overall balance assessment: Needs assistance Sitting-balance support: Bilateral upper extremity supported;Feet supported Sitting balance-Leahy Scale: Poor Sitting balance - Comments: posterior lean.  Pt able to sit min guard assist for ~5 minutes but otherwise requires min>mod assist to trunk to remain upright Postural control: Posterior lean                                   Pertinent Vitals/Pain Pain Assessment: 0-10 Pain Score: 10-Worst pain ever Pain Location: Bil LEs w/ spasms Pain Descriptors / Indicators: Spasm;Moaning;Grimacing;Guarding Pain Intervention(s): Limited activity within patient's tolerance;Monitored during session;Utilized relaxation techniques    Home Living Family/patient expects to be discharged to:: Skilled nursing facility                      Prior Function Level of Independence: Needs assistance   Gait / Transfers Assistance Needed: Pt reports he has not walked in a month and was practicing sit<>stand at SNF since last d/c  ADL's / Homemaking Assistance Needed: Needs assist w/ dressing, bathing        Hand Dominance   Dominant Hand: Right    Extremity/Trunk Assessment   Upper Extremity Assessment: Defer to OT evaluation           Lower Extremity Assessment:  RLE deficits/detail;LLE deficits/detail RLE Deficits / Details: Bil LE limited by constant spasm in quad, hamstrings, and gastroc. Rt knee maintained at ~80 deg flexion, able to extend ~ 30 deg w/ AAROM.   LLE Deficits / Details: Bil LE limited by constant spasm in quad, hamstrings, and gastroc. Lt knee maintained at ~80 deg flexion, able to extend to ~(-)20 deg w/ AAROM.    Cervical / Trunk Assessment: Kyphotic  Communication   Communication: No difficulties  Cognition Arousal/Alertness: Awake/alert Behavior During Therapy: Restless;Anxious Overall Cognitive Status: Within Functional Limits for tasks assessed                       General Comments      Exercises Other Exercises Other Exercises: PROM Bil LEs: DF/PF, knee flexion/extension, x10      Assessment/Plan    PT Assessment Patient needs continued PT services  PT Diagnosis Difficulty walking;Acute pain;Generalized weakness   PT Problem List Decreased strength;Decreased range of motion;Decreased activity tolerance;Decreased balance;Decreased mobility;Decreased coordination;Decreased knowledge of use of DME;Decreased safety awareness;Decreased knowledge of precautions;Cardiopulmonary status limiting activity;Impaired sensation;Pain  PT Treatment Interventions DME instruction;Gait training;Functional mobility training;Therapeutic exercise;Therapeutic activities;Balance training;Neuromuscular re-education;Patient/family education;Modalities;Wheelchair mobility training   PT Goals (Current goals can be found in the Care Plan section) Acute Rehab PT Goals Patient Stated Goal: be able to stand PT Goal Formulation: With patient Time For Goal Achievement: 08/27/15 Potential to Achieve Goals: Fair    Frequency Min 3X/week   Barriers to discharge Decreased caregiver support no assist from family/friends    Co-evaluation               End of Session Equipment Utilized During Treatment: Gait belt Activity Tolerance: Patient limited by pain Patient left: in bed;with call bell/phone within reach;with bed alarm set Nurse Communication: Need for lift equipment;Mobility status;Weight bearing status         Time: 1425-1446 PT Time Calculation (min) (ACUTE ONLY): 21 min   Charges:   PT Evaluation $PT Eval Moderate Complexity: 1 Procedure     PT G Codes:       Michail Jewels PT, DPT [phone] Pager: [phone] 08/10/2015, 3:24 PM

## 2015-08-10 NOTE — NC FL2 (Deleted)
[place] MEDICAID FL2 LEVEL OF CARE SCREENING TOOL     IDENTIFICATION  Patient Name: Jeff Ferrell Birthdate: 03-Jan-1951 Sex: male Admission Date (Current Location): 08/07/2015  [facility] and [place] Number:  Producer, television/film/video and Address:  [facility] [place], [address] [place], [place] [address]      Provider Number: [id number]  Attending Physician Name and Address:  Drema Dallas, MD  Relative Name and Phone Number:  Florina Ou [phone])    Current Level of Care: Hospital Recommended Level of Care: Skilled Nursing Facility Prior Approval Number:    Date Approved/Denied:   PASRR Number: [id number]  Discharge Plan: SNF ([facility] resident at [facility] )    Current Diagnoses: Patient Active Problem List   Diagnosis Date Noted  . Hip fracture requiring operative repair (HCC)   . MRSA bacteremia   . Staphylococcus aureus bacteremia with sepsis (HCC)   . Osteomyelitis of right hip (HCC)   . Hardware complicating wound infection (HCC)   . Infection 08/08/2015  . Sepsis (HCC) 08/07/2015  . DM type 2, goal HbA1c < 7% (HCC) 08/07/2015  . Chronic hyponatremia 08/07/2015  . Fracture of femoral neck, right, closed 07/19/2015  . Femoral neck fracture, right, closed, initial encounter 07/19/2015  . Protein-calorie malnutrition, severe 07/19/2015  . Leg weakness, bilateral 07/02/2015  . Left leg swelling 07/02/2015  . Left-sided chest wall pain   . Acute blood loss anemia 06/21/2015  . Hyponatremia 06/20/2015  . Femur fracture, left (HCC) 06/17/2015  . Discitis of lumbar region   . Left hip pain   . Hypoglycemia 06/16/2015  . UTI (lower urinary tract infection) 06/11/2015  . Back pain 06/11/2015  . Malnutrition of moderate degree 03/30/2015  . Diabetes mellitus (HCC) 03/29/2015    Orientation RESPIRATION BLADDER Height & Weight     Self, Time, Situation, Place  Normal Continent Weight: 161 lb 2.5 oz (73.1 kg) Height:   (175.3  cm)  BEHAVIORAL SYMPTOMS/MOOD NEUROLOGICAL BOWEL NUTRITION STATUS      Continent Diet (Heart healthy; carb-modified   Fluid consistency: Thin)  AMBULATORY STATUS COMMUNICATION OF NEEDS Skin   Limited Assist Verbally Other (Comment) (Incision: Right Hip  07/21/15 (closed) 08/08/15 (closed)   Inicision: Hip Left  07/02/15 (closed))                       Personal Care Assistance Level of Assistance  Bathing, Feeding Bathing Assistance: Limited assistance Feeding assistance: Independent Dressing Assistance: Limited assistance     Functional Limitations Warehouse manager, Speech Sight Info: Adequate Hearing Info: Adequate Speech Info: Adequate    SPECIAL CARE FACTORS FREQUENCY        PT Frequency:  (Unable to assess) OT Frequency:  (Unable to assess)            Contractures      Additional Factors Info    Code Status Info: FULL CODE  Allergies Info: Penicillins   Insulin Sliding Scale Info: Insulin aspart injection, 0-5 units, subcutaneous, daily at bedtime    Insulin aspart injection, 0-9 units, Subcutaneous, 3x daily with meals        Current Medications (08/10/2015):  This is the current hospital active medication list Current Facility-Administered Medications  Medication Dose Route Frequency Provider Last Rate Last Dose  . 0.9 %  sodium chloride infusion   Intravenous Continuous Lonia Blood, MD 100 mL/hr at 08/10/15 [phone]    . 0.9 %  sodium chloride infusion  Intravenous Once Leanne Chang, NP      . acetaminophen (TYLENOL) tablet 650 mg  650 mg Oral Q6H PRN Cammy Copa, MD   650 mg at 08/09/15 2015   Or  . acetaminophen (TYLENOL) suppository 650 mg  650 mg Rectal Q6H PRN Cammy Copa, MD      . alum & mag hydroxide-simeth (MAALOX/MYLANTA) 200-200-20 MG/5ML suspension 30 mL  30 mL Oral Q6H PRN Calvert Cantor, MD      . Chlorhexidine Gluconate Cloth 2 % PADS 6 each  6 each Topical Q0600 Lonia Blood, MD   6 each at 08/10/15 [phone]  . docusate  sodium (COLACE) capsule 100 mg  100 mg Oral BID Lonia Blood, MD   100 mg at 08/10/15 0913  . enoxaparin (LOVENOX) injection 40 mg  40 mg Subcutaneous Q24H Cammy Copa, MD   40 mg at 08/09/15 0956  . feeding supplement (ENSURE ENLIVE) (ENSURE ENLIVE) liquid 237 mL  237 mL Oral TID BM Saima Rizwan, MD   237 mL at 08/09/15 1400  . gabapentin (NEURONTIN) capsule 100 mg  100 mg Oral TID Lonia Blood, MD   100 mg at 08/10/15 0913  . insulin aspart (novoLOG) injection 0-5 Units  0-5 Units Subcutaneous QHS Lonia Blood, MD   0 Units at 08/08/15 2200  . insulin aspart (novoLOG) injection 0-9 Units  0-9 Units Subcutaneous TID WC Lonia Blood, MD   1 Units at 08/09/15 1353  . methocarbamol (ROBAXIN) tablet 500 mg  500 mg Oral QID Meredith Pel, NP   500 mg at 08/10/15 0913  . metoCLOPramide (REGLAN) tablet 5-10 mg  5-10 mg Oral Q8H PRN Cammy Copa, MD       Or  . metoCLOPramide (REGLAN) injection 5-10 mg  5-10 mg Intravenous Q8H PRN Cammy Copa, MD      . morphine 2 MG/ML injection 2-4 mg  2-4 mg Intravenous Q2H PRN Lonia Blood, MD   4 mg at 08/10/15 0544  . mupirocin ointment (BACTROBAN) 2 % 1 application  1 application Nasal BID Lonia Blood, MD   1 application at 08/10/15 0801  . ondansetron (ZOFRAN) tablet 4 mg  4 mg Oral Q6H PRN Cammy Copa, MD       Or  . ondansetron [facility]) injection 4 mg  4 mg Intravenous Q6H PRN Cammy Copa, MD      . oxyCODONE (Oxy IR/ROXICODONE) immediate release tablet 5-10 mg  5-10 mg Oral Q3H PRN Lonia Blood, MD   10 mg at 08/10/15 0914  . rifampin (RIFADIN) capsule 300 mg  300 mg Oral Daily Ginnie Smart, MD   300 mg at 08/10/15 0913  . tamsulosin (FLOMAX) capsule 0.4 mg  0.4 mg Oral Daily Lonia Blood, MD   0.4 mg at 08/10/15 0913  . vancomycin (VANCOCIN) IVPB 1000 mg/200 mL premix  1,000 mg Intravenous Q8H Titus Mould, RPH   1,000 mg at 08/10/15 9604     Discharge Medications: Please  see discharge summary for a list of discharge medications.  Relevant Imaging Results:  Relevant Lab Results:   Additional Information SSN: [id number]  Baldo Daub, Student-SW [phone]

## 2015-08-10 NOTE — Progress Notes (Signed)
Cultures growing GPCs - likely MRSA Appreciate ID assistance with this matter Continue to keep iVAC on incision until cultures are final and patient is ready for discharge Continues to be tachycardic and hypotensive at times Consider RBCs for Hgb 7.8  N. Glee Arvin, MD [facility] [phone] 7:59 AM

## 2015-08-11 ENCOUNTER — Inpatient Hospital Stay (HOSPITAL_COMMUNITY): Payer: Medicare Other

## 2015-08-11 DIAGNOSIS — N10 Acute tubulo-interstitial nephritis: Secondary | ICD-10-CM | POA: Diagnosis present

## 2015-08-11 DIAGNOSIS — M545 Low back pain, unspecified: Secondary | ICD-10-CM | POA: Diagnosis present

## 2015-08-11 DIAGNOSIS — R7881 Bacteremia: Secondary | ICD-10-CM

## 2015-08-11 DIAGNOSIS — M62838 Other muscle spasm: Secondary | ICD-10-CM | POA: Diagnosis present

## 2015-08-11 DIAGNOSIS — A4102 Sepsis due to Methicillin resistant Staphylococcus aureus: Secondary | ICD-10-CM

## 2015-08-11 DIAGNOSIS — E118 Type 2 diabetes mellitus with unspecified complications: Secondary | ICD-10-CM | POA: Insufficient documentation

## 2015-08-11 DIAGNOSIS — E876 Hypokalemia: Secondary | ICD-10-CM | POA: Diagnosis present

## 2015-08-11 LAB — WOUND CULTURE

## 2015-08-11 LAB — CBC
HEMATOCRIT: 22.5 % — AB (ref 39.0–52.0)
Hemoglobin: 7.5 g/dL — ABNORMAL LOW (ref 13.0–17.0)
MCH: 25.8 pg — ABNORMAL LOW (ref 26.0–34.0)
MCHC: 33.3 g/dL (ref 30.0–36.0)
MCV: 77.3 fL — AB (ref 78.0–100.0)
PLATELETS: 303 10*3/uL (ref 150–400)
RBC: 2.91 MIL/uL — AB (ref 4.22–5.81)
RDW: 14.3 % (ref 11.5–15.5)
WBC: 7.1 10*3/uL (ref 4.0–10.5)

## 2015-08-11 LAB — CULTURE, BLOOD (ROUTINE X 2)

## 2015-08-11 LAB — COMPREHENSIVE METABOLIC PANEL
ALT: 12 U/L — ABNORMAL LOW (ref 17–63)
AST: 26 U/L (ref 15–41)
Albumin: 2.1 g/dL — ABNORMAL LOW (ref 3.5–5.0)
Alkaline Phosphatase: 191 U/L — ABNORMAL HIGH (ref 38–126)
Anion gap: 11 (ref 5–15)
BILIRUBIN TOTAL: 0.8 mg/dL (ref 0.3–1.2)
CHLORIDE: 90 mmol/L — AB (ref 101–111)
CO2: 25 mmol/L (ref 22–32)
CREATININE: 0.53 mg/dL — AB (ref 0.61–1.24)
Calcium: 8.3 mg/dL — ABNORMAL LOW (ref 8.9–10.3)
Glucose, Bld: 197 mg/dL — ABNORMAL HIGH (ref 65–99)
POTASSIUM: 3.1 mmol/L — AB (ref 3.5–5.1)
Sodium: 126 mmol/L — ABNORMAL LOW (ref 135–145)
TOTAL PROTEIN: 5.5 g/dL — AB (ref 6.5–8.1)

## 2015-08-11 LAB — CBC WITH DIFFERENTIAL/PLATELET
Basophils Absolute: 0 10*3/uL (ref 0.0–0.1)
Basophils Relative: 0 %
Eosinophils Absolute: 0.2 10*3/uL (ref 0.0–0.7)
Eosinophils Relative: 3 %
HCT: 23.5 % — ABNORMAL LOW (ref 39.0–52.0)
Hemoglobin: 7.9 g/dL — ABNORMAL LOW (ref 13.0–17.0)
Lymphocytes Relative: 12 %
Lymphs Abs: 0.8 10*3/uL (ref 0.7–4.0)
MCH: 25.6 pg — ABNORMAL LOW (ref 26.0–34.0)
MCHC: 33.6 g/dL (ref 30.0–36.0)
MCV: 76.1 fL — ABNORMAL LOW (ref 78.0–100.0)
Monocytes Absolute: 1 10*3/uL (ref 0.1–1.0)
Monocytes Relative: 14 %
Neutro Abs: 5 10*3/uL (ref 1.7–7.7)
Neutrophils Relative %: 71 %
Platelets: 364 10*3/uL (ref 150–400)
RBC: 3.09 MIL/uL — ABNORMAL LOW (ref 4.22–5.81)
RDW: 14.2 % (ref 11.5–15.5)
WBC: 7 10*3/uL (ref 4.0–10.5)

## 2015-08-11 LAB — GLUCOSE, CAPILLARY
GLUCOSE-CAPILLARY: 127 mg/dL — AB (ref 65–99)
GLUCOSE-CAPILLARY: 175 mg/dL — AB (ref 65–99)
Glucose-Capillary: 134 mg/dL — ABNORMAL HIGH (ref 65–99)
Glucose-Capillary: 155 mg/dL — ABNORMAL HIGH (ref 65–99)

## 2015-08-11 LAB — HEMOGLOBIN A1C
HEMOGLOBIN A1C: 5.7 % — AB (ref 4.8–5.6)
MEAN PLASMA GLUCOSE: 117 mg/dL

## 2015-08-11 LAB — MAGNESIUM: Magnesium: 1.2 mg/dL — ABNORMAL LOW (ref 1.7–2.4)

## 2015-08-11 LAB — VANCOMYCIN, TROUGH: Vancomycin Tr: 14 ug/mL (ref 10.0–20.0)

## 2015-08-11 MED ORDER — SODIUM CHLORIDE 0.9 % IV SOLN
1250.0000 mg | Freq: Three times a day (TID) | INTRAVENOUS | Status: DC
  Administered 2015-08-12 – 2015-08-14 (×6): 1250 mg via INTRAVENOUS
  Filled 2015-08-11 (×11): qty 1250

## 2015-08-11 MED ORDER — METHOCARBAMOL 500 MG PO TABS
750.0000 mg | ORAL_TABLET | Freq: Four times a day (QID) | ORAL | Status: DC
  Administered 2015-08-11 – 2015-08-13 (×7): 750 mg via ORAL
  Filled 2015-08-11 (×7): qty 2

## 2015-08-11 MED ORDER — POTASSIUM CHLORIDE CRYS ER 20 MEQ PO TBCR
50.0000 meq | EXTENDED_RELEASE_TABLET | Freq: Once | ORAL | Status: AC
  Administered 2015-08-11: 50 meq via ORAL
  Filled 2015-08-11: qty 2

## 2015-08-11 MED ORDER — NALOXONE HCL 0.4 MG/ML IJ SOLN: 0.4000 mg | INTRAMUSCULAR | Status: DC | PRN

## 2015-08-11 MED ORDER — MIDAZOLAM HCL 2 MG/2ML IJ SOLN: 2.0000 mg | Freq: Once | INTRAMUSCULAR | Status: DC

## 2015-08-11 MED ORDER — MIDAZOLAM HCL 2 MG/2ML IJ SOLN
2.0000 mg | Freq: Once | INTRAMUSCULAR | Status: DC
Start: 2015-08-11 — End: 2015-08-11

## 2015-08-11 MED ORDER — DIAZEPAM 5 MG PO TABS
5.0000 mg | ORAL_TABLET | Freq: Four times a day (QID) | ORAL | Status: DC | PRN
  Administered 2015-08-11 – 2015-08-15 (×13): 5 mg via ORAL
  Filled 2015-08-11 (×13): qty 1

## 2015-08-11 MED ORDER — FLUMAZENIL 0.5 MG/5ML IV SOLN: 0.2000 mg | INTRAVENOUS | Status: DC | PRN

## 2015-08-11 MED ORDER — SODIUM CHLORIDE 0.9 % IV BOLUS (SEPSIS)
500.0000 mL | Freq: Once | INTRAVENOUS | Status: AC
  Administered 2015-08-11: 500 mL via INTRAVENOUS

## 2015-08-11 MED ORDER — FENTANYL CITRATE (PF) 100 MCG/2ML IJ SOLN: 25.0000 ug | Freq: Once | INTRAMUSCULAR | Status: DC

## 2015-08-11 NOTE — Progress Notes (Signed)
Subjective: Still with sig back and hip pain  Antibiotics:  Anti-infectives    Start     Dose/Rate Route Frequency Ordered Stop   08/10/15 1000  vancomycin (VANCOCIN) IVPB 1000 mg/200 mL premix     1,000 mg 200 mL/hr over 60 Minutes Intravenous Every 8 hours 08/10/15 0634     08/09/15 1200  cefTRIAXone (ROCEPHIN) 2 g in dextrose 5 % 50 mL IVPB  Status:  Discontinued     2 g 100 mL/hr over 30 Minutes Intravenous Every 24 hours 08/09/15 1006 08/09/15 1140   08/08/15 1200  cefTRIAXone (ROCEPHIN) 1 g in dextrose 5 % 50 mL IVPB  Status:  Discontinued     1 g 100 mL/hr over 30 Minutes Intravenous Every 24 hours 08/08/15 1015 08/09/15 1006   08/08/15 1200  rifampin (RIFADIN) capsule 300 mg     300 mg Oral Daily 08/08/15 1015     08/08/15 0841  vancomycin (VANCOCIN) powder  Status:  Discontinued       As needed 08/08/15 0841 08/08/15 0938   08/08/15 0840  gentamicin (GARAMYCIN) injection  Status:  Discontinued       As needed 08/08/15 0841 08/08/15 0938   08/08/15 0500  vancomycin (VANCOCIN) IVPB 1000 mg/200 mL premix  Status:  Discontinued     1,000 mg 200 mL/hr over 60 Minutes Intravenous Every 12 hours 08/07/15 1552 08/10/15 0634   08/08/15 0100  vancomycin (VANCOCIN) IVPB 1000 mg/200 mL premix  Status:  Discontinued     1,000 mg 200 mL/hr over 60 Minutes Intravenous Every 12 hours 08/07/15 1219 08/07/15 1552   08/07/15 2000  [MAR Hold]  aztreonam (AZACTAM) 2 g in dextrose 5 % 50 mL IVPB  Status:  Discontinued     (MAR Hold since 08/08/15 0752)   2 g 100 mL/hr over 30 Minutes Intravenous 3 times per day 08/07/15 1219 08/08/15 1015   08/07/15 1600  vancomycin (VANCOCIN) 1,500 mg in sodium chloride 0.9 % 500 mL IVPB     1,500 mg 250 mL/hr over 120 Minutes Intravenous  Once 08/07/15 1551 08/07/15 1858   08/07/15 1130  vancomycin (VANCOCIN) 1,500 mg in sodium chloride 0.9 % 500 mL IVPB     1,500 mg 250 mL/hr over 120 Minutes Intravenous  Once 08/07/15 1054 08/07/15 1615   08/07/15 1100  aztreonam (AZACTAM) 2 g in dextrose 5 % 50 mL IVPB     2 g 100 mL/hr over 30 Minutes Intravenous  Once 08/07/15 1054 08/07/15 1248      Medications: Scheduled Meds: . sodium chloride   Intravenous Once  . Chlorhexidine Gluconate Cloth  6 each Topical Q0600  . docusate sodium  100 mg Oral BID  . enoxaparin (LOVENOX) injection  40 mg Subcutaneous Q24H  . feeding supplement (ENSURE ENLIVE)  237 mL Oral TID BM  . gabapentin  100 mg Oral TID  . insulin aspart  0-5 Units Subcutaneous QHS  . insulin aspart  0-9 Units Subcutaneous TID WC  . methocarbamol  750 mg Oral QID  . mupirocin ointment  1 application Nasal BID  . rifampin  300 mg Oral Daily  . tamsulosin  0.4 mg Oral Daily  . vancomycin  1,000 mg Intravenous Q8H   Continuous Infusions: . sodium chloride 100 mL/hr at 08/11/15 0948   PRN Meds:.acetaminophen **OR** acetaminophen, alum & mag hydroxide-simeth, diazepam, metoCLOPramide **OR** metoCLOPramide (REGLAN) injection, morphine injection, ondansetron **OR** ondansetron (ZOFRAN) IV, oxyCODONE    Objective: Weight change:  Intake/Output Summary (Last 24 hours) at 08/11/15 1318 Last data filed at 08/11/15 1205  Gross per 24 hour  Intake   3780 ml  Output   2885 ml  Net    895 ml   Blood pressure 121/60, pulse 99, temperature 97.9 F (36.6 C), temperature source Oral, resp. rate 24, height 5\' 9"  (1.753 m), weight 161 lb 2.5 oz (73.1 kg), SpO2 98 %. Temp:  [97.9 F (36.6 C)-100 F (37.8 C)] 97.9 F (36.6 C) (02/22 1158) Pulse Rate:  [99-138] 99 (02/22 1156) Resp:  [19-37] 24 (02/22 1156) BP: (108-139)/(45-74) 121/60 mmHg (02/22 1156) SpO2:  [92 %-98 %] 98 % (02/22 1156)  Physical Exam: General: Alert and awake, oriented x3, not in any acute distress. Lying on side due to pain in hip and back Neuro: nonfocal  CBC:  CBC Latest Ref Rng 08/11/2015 08/10/2015 08/09/2015  WBC 4.0 - 10.5 K/uL 7.1 8.3 7.8  Hemoglobin 13.0 - 17.0 g/dL 7.5(L) 7.8(L) 7.3(L)    Hematocrit 39.0 - 52.0 % 22.5(L) 23.9(L) 22.2(L)  Platelets 150 - 400 K/uL 303 315 288       BMET  Recent Labs  08/09/15 0434 08/10/15 0507  NA 127* 128*  K 3.5 3.2*  CL 94* 94*  CO2 21* 24  GLUCOSE 90 90  BUN <5* <5*  CREATININE 0.62 0.59*  CALCIUM 8.8* 8.8*     Liver Panel   Recent Labs  08/09/15 0434 08/10/15 0507  PROT 6.1* 5.9*  ALBUMIN 2.6* 2.4*  AST 24 20  ALT 15* 12*  ALKPHOS 163* 193*  BILITOT 1.3* 1.6*       Sedimentation Rate No results for input(s): ESRSEDRATE in the last 72 hours. C-Reactive Protein No results for input(s): CRP in the last 72 hours.  Micro Results: Recent Results (from the past 720 hour(s))  Surgical pcr screen     Status: Abnormal   Collection Time: 07/19/15  8:55 AM  Result Value Ref Range Status   MRSA, PCR POSITIVE (A) NEGATIVE Final    Comment: RESULT CALLED TO, READ BACK BY AND VERIFIED WITH: AMY FARGO,RN [id number] @ 1417 BY J SCOTTON    Staphylococcus aureus POSITIVE (A) NEGATIVE Final    Comment:        The Xpert SA Assay (FDA approved for NASAL specimens in patients over 59 years of age), is one component of a comprehensive surveillance program.  Test performance has been validated by [facility] for patients greater than or equal to 61 year old. It is not intended to diagnose infection nor to guide or monitor treatment. RESULT CALLED TO, READ BACK BY AND VERIFIED WITH: AMY FARGO,RN [id number] @ 1417 BY J SCOTTON   Blood Culture (routine x 2)     Status: None   Collection Time: 08/07/15 10:47 AM  Result Value Ref Range Status   Specimen Description LEFT ANTECUBITAL  Final   Special Requests BOTTLES DRAWN AEROBIC AND ANAEROBIC 5CC  Final   Culture  Setup Time   Final    GRAM POSITIVE COCCI IN CLUSTERS IN BOTH AEROBIC AND ANAEROBIC BOTTLES CRITICAL RESULT CALLED TO, READ BACK BY AND VERIFIED WITH: Lin Landsman RN [phone] [id number] GREEN R CONFIRMED BY M. CAMPBELL    Culture METHICILLIN RESISTANT STAPHYLOCOCCUS  AUREUS  Final   Report Status 08/10/2015 FINAL  Final   Organism ID, Bacteria METHICILLIN RESISTANT STAPHYLOCOCCUS AUREUS  Final      Susceptibility   Methicillin resistant staphylococcus aureus - MIC*    CIPROFLOXACIN >=8 RESISTANT  Resistant     ERYTHROMYCIN >=8 RESISTANT Resistant     GENTAMICIN <=0.5 SENSITIVE Sensitive     OXACILLIN >=4 RESISTANT Resistant     TETRACYCLINE <=1 SENSITIVE Sensitive     VANCOMYCIN <=0.5 SENSITIVE Sensitive     TRIMETH/SULFA >=320 RESISTANT Resistant     CLINDAMYCIN <=0.25 SENSITIVE Sensitive     RIFAMPIN <=0.5 SENSITIVE Sensitive     Inducible Clindamycin NEGATIVE Sensitive     * METHICILLIN RESISTANT STAPHYLOCOCCUS AUREUS  Wound culture     Status: None   Collection Time: 08/07/15 10:53 AM  Result Value Ref Range Status   Specimen Description WOUND RIGHT HIP  Final   Special Requests SKIN DEEP EXCISION   Final   Gram Stain   Final    NO WBC SEEN NO SQUAMOUS EPITHELIAL CELLS SEEN NO ORGANISMS SEEN Performed at [facility]    Culture   Final    ABUNDANT METHICILLIN RESISTANT STAPHYLOCOCCUS AUREUS Note: RIFAMPIN AND GENTAMICIN SHOULD NOT BE USED AS SINGLE DRUGS FOR TREATMENT OF STAPH INFECTIONS. This organism DOES NOT demonstrate inducible Clindamycin resistance in vitro. CRITICAL RESULT CALLED TO, READ BACK BY AND VERIFIED WITH: WHITNEY DAVIS @  9:10 AM 08/10/15 BY DWEEKS Performed at [facility]    Report Status 08/10/2015 FINAL  Final   Organism ID, Bacteria METHICILLIN RESISTANT STAPHYLOCOCCUS AUREUS  Final      Susceptibility   Methicillin resistant staphylococcus aureus - MIC*    CLINDAMYCIN <=0.25 SENSITIVE Sensitive     ERYTHROMYCIN >=8 RESISTANT Resistant     GENTAMICIN <=0.5 SENSITIVE Sensitive     LEVOFLOXACIN >=8 RESISTANT Resistant     OXACILLIN >=4 RESISTANT Resistant     RIFAMPIN <=0.5 SENSITIVE Sensitive     TRIMETH/SULFA >=320 RESISTANT Resistant     VANCOMYCIN <=0.5 SENSITIVE Sensitive     TETRACYCLINE  <=1 SENSITIVE Sensitive     * ABUNDANT METHICILLIN RESISTANT STAPHYLOCOCCUS AUREUS  Blood Culture (routine x 2)     Status: None   Collection Time: 08/07/15 11:18 AM  Result Value Ref Range Status   Specimen Description BLOOD LEFT HAND  Final   Special Requests BOTTLES DRAWN AEROBIC ONLY 10CC  Final   Culture  Setup Time   Final    GRAM POSITIVE COCCI IN CLUSTERS AEROBIC BOTTLE ONLY CRITICAL RESULT CALLED TO, READ BACK BY AND VERIFIED WITH: T. IRBY RN [phone] [id number] GREEN R CONFIRMED BY M. CAMPBELL    Culture   Final    STAPHYLOCOCCUS AUREUS SUSCEPTIBILITIES PERFORMED ON PREVIOUS CULTURE WITHIN THE LAST 5 DAYS.    Report Status 08/10/2015 FINAL  Final  Urine culture     Status: None   Collection Time: 08/07/15 12:23 PM  Result Value Ref Range Status   Specimen Description URINE, CATHETERIZED  Final   Special Requests NONE  Final   Culture   Final    >=100,000 COLONIES/mL METHICILLIN RESISTANT STAPHYLOCOCCUS AUREUS   Report Status 08/09/2015 FINAL  Final   Organism ID, Bacteria METHICILLIN RESISTANT STAPHYLOCOCCUS AUREUS  Final      Susceptibility   Methicillin resistant staphylococcus aureus - MIC*    CIPROFLOXACIN >=8 RESISTANT Resistant     GENTAMICIN <=0.5 SENSITIVE Sensitive     NITROFURANTOIN <=16 SENSITIVE Sensitive     OXACILLIN >=4 RESISTANT Resistant     TETRACYCLINE <=1 SENSITIVE Sensitive     VANCOMYCIN 1 SENSITIVE Sensitive     TRIMETH/SULFA >=320 RESISTANT Resistant     CLINDAMYCIN <=0.25 SENSITIVE Sensitive     RIFAMPIN <=  0.5 SENSITIVE Sensitive     Inducible Clindamycin NEGATIVE Sensitive     * >=100,000 COLONIES/mL METHICILLIN RESISTANT STAPHYLOCOCCUS AUREUS  MRSA PCR Screening     Status: Abnormal   Collection Time: 08/08/15  5:32 AM  Result Value Ref Range Status   MRSA by PCR POSITIVE (A) NEGATIVE Final    Comment:        The GeneXpert MRSA Assay (FDA approved for NASAL specimens only), is one component of a comprehensive MRSA colonization surveillance  program. It is not intended to diagnose MRSA infection nor to guide or monitor treatment for MRSA infections. RESULT CALLED TO, READ BACK BY AND VERIFIED WITH: RN GORDON,R AT 1002 [id number] MARTINB   Anaerobic culture     Status: None (Preliminary result)   Collection Time: 08/08/15  8:28 AM  Result Value Ref Range Status   Specimen Description WOUND RIGHT LEG  Final   Special Requests POF VANCOMYCIN LOWER RIGHT LEG INCISION PART A   Final   Gram Stain   Final    ABUNDANT WBC PRESENT, PREDOMINANTLY PMN NO SQUAMOUS EPITHELIAL CELLS SEEN FEW GRAM POSITIVE COCCI IN CLUSTERS IN PAIRS Performed at [facility]    Culture   Final    NO ANAEROBES ISOLATED; CULTURE IN PROGRESS FOR 5 DAYS Performed at [facility]    Report Status PENDING  Incomplete  Wound culture     Status: None   Collection Time: 08/08/15  8:28 AM  Result Value Ref Range Status   Specimen Description WOUND RIGHT LEG  Final   Special Requests POF VANCOMYCIN RIGHT LOWER LEG INCISION PART A  Final   Gram Stain   Final    FEW WBC PRESENT, PREDOMINANTLY PMN NO SQUAMOUS EPITHELIAL CELLS SEEN RARE GRAM POSITIVE COCCI IN PAIRS Performed at [facility]    Culture   Final    MODERATE METHICILLIN RESISTANT STAPHYLOCOCCUS AUREUS Note: RIFAMPIN AND GENTAMICIN SHOULD NOT BE USED AS SINGLE DRUGS FOR TREATMENT OF STAPH INFECTIONS. This organism DOES NOT demonstrate inducible Clindamycin resistance in vitro. CRITICAL RESULT CALLED TO, READ BACK BY AND VERIFIED WITH: CENLINA S 2/22 @  0920 BY REAMM Performed at [facility]    Report Status 08/11/2015 FINAL  Final   Organism ID, Bacteria METHICILLIN RESISTANT STAPHYLOCOCCUS AUREUS  Final      Susceptibility   Methicillin resistant staphylococcus aureus - MIC*    CLINDAMYCIN <=0.25 SENSITIVE Sensitive     ERYTHROMYCIN >=8 RESISTANT Resistant     GENTAMICIN <=0.5 SENSITIVE Sensitive     LEVOFLOXACIN >=8 RESISTANT Resistant     OXACILLIN >=4  RESISTANT Resistant     RIFAMPIN <=0.5 SENSITIVE Sensitive     TRIMETH/SULFA >=320 RESISTANT Resistant     VANCOMYCIN 1 SENSITIVE Sensitive     TETRACYCLINE <=1 SENSITIVE Sensitive     * MODERATE METHICILLIN RESISTANT STAPHYLOCOCCUS AUREUS  Anaerobic culture     Status: None (Preliminary result)   Collection Time: 08/08/15  8:49 AM  Result Value Ref Range Status   Specimen Description WOUND RIGHT LEG  Final   Special Requests POF VANCOMYCIN PROXIMAL RIGHT LOWER LEG PART B  Final   Gram Stain   Final    ABUNDANT WBC PRESENT, PREDOMINANTLY PMN NO SQUAMOUS EPITHELIAL CELLS SEEN FEW GRAM POSITIVE COCCI IN PAIRS IN CLUSTERS Performed at [facility]    Culture   Final    NO ANAEROBES ISOLATED; CULTURE IN PROGRESS FOR 5 DAYS Performed at [facility]    Report  Status PENDING  Incomplete  Wound culture     Status: None   Collection Time: 08/08/15  8:49 AM  Result Value Ref Range Status   Specimen Description WOUND RIGHT LEG  Final   Special Requests POF VANCOMYCIN PROXIMAL RIGHT LEG INCISON PART B  Final   Gram Stain   Final    FEW WBC PRESENT, PREDOMINANTLY PMN NO SQUAMOUS EPITHELIAL CELLS SEEN FEW GRAM POSITIVE COCCI IN PAIRS IN CLUSTERS Performed at [facility]    Culture   Final    MODERATE METHICILLIN RESISTANT STAPHYLOCOCCUS AUREUS Note: RIFAMPIN AND GENTAMICIN SHOULD NOT BE USED AS SINGLE DRUGS FOR TREATMENT OF STAPH INFECTIONS. This organism DOES NOT demonstrate inducible Clindamycin resistance in vitro. CRITICAL RESULT CALLED TO, READ BACK BY AND VERIFIED WITH: CENLINA S 2/22 @  0920 BY REAMM Performed at [facility]    Report Status 08/11/2015 FINAL  Final   Organism ID, Bacteria METHICILLIN RESISTANT STAPHYLOCOCCUS AUREUS  Final      Susceptibility   Methicillin resistant staphylococcus aureus - MIC*    CLINDAMYCIN <=0.25 SENSITIVE Sensitive     ERYTHROMYCIN >=8 RESISTANT Resistant     GENTAMICIN <=0.5 SENSITIVE Sensitive      LEVOFLOXACIN >=8 RESISTANT Resistant     OXACILLIN >=4 RESISTANT Resistant     RIFAMPIN <=0.5 SENSITIVE Sensitive     TRIMETH/SULFA >=320 RESISTANT Resistant     VANCOMYCIN 1 SENSITIVE Sensitive     TETRACYCLINE <=1 SENSITIVE Sensitive     * MODERATE METHICILLIN RESISTANT STAPHYLOCOCCUS AUREUS  Culture, blood (Routine X 2) w Reflex to ID Panel     Status: None   Collection Time: 08/08/15 12:40 PM  Result Value Ref Range Status   Specimen Description BLOOD RIGHT ANTECUBITAL  Final   Special Requests IN PEDIATRIC BOTTLE 2CC  Final   Culture  Setup Time   Final    GRAM POSITIVE COCCI IN CLUSTERS PEDIATRIC BOTTLE CRITICAL RESULT CALLED TO, READ BACK BY AND VERIFIED WITH: W DAVIS,RN AT 1043 08/09/15 BY L BENFIELD    Culture METHICILLIN RESISTANT STAPHYLOCOCCUS AUREUS  Final   Report Status 08/11/2015 FINAL  Final   Organism ID, Bacteria METHICILLIN RESISTANT STAPHYLOCOCCUS AUREUS  Final      Susceptibility   Methicillin resistant staphylococcus aureus - MIC*    CIPROFLOXACIN >=8 RESISTANT Resistant     ERYTHROMYCIN >=8 RESISTANT Resistant     GENTAMICIN <=0.5 SENSITIVE Sensitive     OXACILLIN >=4 RESISTANT Resistant     TETRACYCLINE <=1 SENSITIVE Sensitive     VANCOMYCIN 1 SENSITIVE Sensitive     TRIMETH/SULFA >=320 RESISTANT Resistant     CLINDAMYCIN <=0.25 SENSITIVE Sensitive     RIFAMPIN <=0.5 SENSITIVE Sensitive     Inducible Clindamycin NEGATIVE Sensitive     * METHICILLIN RESISTANT STAPHYLOCOCCUS AUREUS  Culture, blood (Routine X 2) w Reflex to ID Panel     Status: None (Preliminary result)   Collection Time: 08/08/15 12:45 PM  Result Value Ref Range Status   Specimen Description BLOOD RIGHT ARM  Final   Special Requests BOTTLES DRAWN AEROBIC ONLY  5CC  Final   Culture NO GROWTH 3 DAYS  Final   Report Status PENDING  Incomplete  Culture, blood (Routine X 2) w Reflex to ID Panel     Status: None (Preliminary result)   Collection Time: 08/09/15 12:09 PM  Result Value Ref Range  Status   Specimen Description BLOOD BLOOD RIGHT FOREARM  Final   Special Requests IN PEDIATRIC BOTTLE .3CC  Final   Culture NO GROWTH 2 DAYS  Final   Report Status PENDING  Incomplete  Culture, blood (Routine X 2) w Reflex to ID Panel     Status: None (Preliminary result)   Collection Time: 08/09/15 12:15 PM  Result Value Ref Range Status   Specimen Description BLOOD RIGHT ANTECUBITAL  Final   Special Requests BOTTLES DRAWN AEROBIC ONLY 6CC  Final   Culture NO GROWTH 2 DAYS  Final   Report Status PENDING  Incomplete    Studies/Results: No results found.    Assessment/Plan:  INTERVAL HISTORY:   07/2015: original cultures ID with MRSA, repeat blood cultures unfortunately PERSISTENTLY POsitive in 1/2 2/21--08/11/15: 2/19 blood cultures still + for MRSA, repeat blood cultures from 08/09/15: no growth   Principal Problem:   Sepsis (HCC) Active Problems:   UTI (lower urinary tract infection)   DM type 2, goal HbA1c < 7% (HCC)   Chronic hyponatremia   Infection   Hip fracture requiring operative repair (HCC)   MRSA bacteremia   Staphylococcus aureus bacteremia with sepsis (HCC)   Osteomyelitis of right hip (HCC)   Hardware complicating wound infection (HCC)   Sepsis due to methicillin resistant Staphylococcus aureus (MRSA) (HCC)   Uncontrolled type 2 diabetes mellitus with complication (HCC)   Acute urinary retention    Jeff Ferrell is a 65 y.o. male with  MRSA bacteremia sepsis due to Right hip hardware associated hip infection   #1 MRSA bacteremia     [facility] Staphylococcus aureus bacteremia   Staphylococcus aureus bacteremia (SAB) is associated with a high rate of complications and mortality.  Specific aspects of clinical management are critical to optimizing the outcome of patients with SAB.  Therefore, the [facility] Antimicrobial Management Team [facility]) has initiated an intervention aimed at improving the management of SAB at [facility].  To do so, Infectious Diseases physicians are providing an evidence-based consult for the management of all patients with SAB.     Yes No Comments  Perform follow-up blood cultures (even if the patient is afebrile) to ensure clearance of bacteremia [x]  []  Repeat blood cultures 08/08/15 still positive for MRSA 2/2  repeat cultures 08/09/15 NG so far  Remove vascular catheter and obtain follow-up blood cultures after the removal of the catheter []  []  DO NOT PLACE PICC OR CENTRAL LINE FOR THE NEXT 4-5 DAYS AND UNTIL WE CAN PROVE HE IS CLEARING HIS BACTEREMIA  Perform echocardiography to evaluate for endocarditis (transthoracic ECHO is 40-50% sensitive, TEE is > 90% sensitive) []  []  Please keep in mind, that neither test can definitively EXCLUDE endocarditis, and that should clinical suspicion remain high for endocarditis the patient should then still be treated with an "endocarditis" duration of therapy = 6 weeks  He needs a TEE  Consult electrophysiologist to evaluate implanted cardiac device (pacemaker, ICD) []  []    Ensure source control []  []  Have all abscesses been drained effectively? Have deep seeded infections (septic joints or osteomyelitis) had appropriate surgical debridement?  He has had I and D of hip. He still has hardware that may need to be removed but trying to keep it in for now due to his fracture   Investigate for "metastatic" sites of infection []  []  Does the patient have ANY symptom or physical exam finding that would suggest a deeper infection (back or neck pain that may be suggestive of vertebral osteomyelitis or epidural abscess, muscle pain that could be a symptom of pyomyositis)?  Keep in  mind that for deep seeded infections MRI imaging with contrast is preferred rather than other often insensitive tests such as plain x-rays, especially early in a patient's presentation.  I am concerned about his worsening mid back and lower back pain. MRI L spine done in January did  not show diskitis  I would recommend obtaining MRI T and L spine with contrast  He may need conscious sedation or anesthesia to get through this given level of pain  Change antibiotic therapy to Vancomycin   Beta-lactam antibiotics are preferred for MSSA due to higher cure rates.   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

## 2015-08-11 NOTE — Progress Notes (Signed)
ANTIBIOTIC CONSULT NOTE   Pharmacy Consult for Vanco Indication: MRSA bacteremia  Allergies  Allergen Reactions  . Penicillins Hives and Rash    Has patient had a PCN reaction causing immediate rash, facial/tongue/throat swelling, SOB or lightheadedness with hypotension: Yes Has patient had a PCN reaction causing severe rash involving mucus membranes or skin necrosis: Yes   Has patient had a PCN reaction that required hospitalization No Has patient had a PCN reaction occurring within the last 10 years: No If all of the above answers are "NO", then may proceed with Cephalosporin use.     Patient Measurements: Height:  (175.3 cm) Weight: 161 lb 2.5 oz (73.1 kg) IBW/kg (Calculated) : 70.7 Adjusted Body Weight:    Vital Signs: Temp: 98.6 F (37 C) (02/22 1424) Temp Source: Oral (02/22 1424) BP: 111/62 mmHg (02/22 1607) Pulse Rate: 88 (02/22 1607) Intake/Output from previous day: 02/21 0701 - 02/22 0700 In: 4200 [P.O.:1200; I.V.:2400; IV Piggyback:600] Out: 3360 [Urine:3260; Drains:100] Intake/Output from this shift: Total I/O In: 2380 [P.O.:1080; I.V.:900; IV Piggyback:400] Out: 1025 [Urine:1025]  Labs:  Recent Labs  08/09/15 0434 08/09/15 1557 08/10/15 0507 08/11/15 1200  WBC 6.9 7.8 8.3 7.1  HGB 6.5* 7.3* 7.8* 7.5*  PLT 287 288 315 303  CREATININE 0.62  --  0.59* 0.53*   Estimated Creatinine Clearance: 92.1 mL/min (by C-G formula based on Cr of 0.53).  Recent Labs  08/10/15 0507 08/11/15 1710  VANCOTROUGH 7* 14     Microbiology:   Medical History: Past Medical History  Diagnosis Date  . Diabetes mellitus     regulated by diet  . Chronic back pain   . 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 Diabetic osteomyelitis (HCC) 03/29/2015  . Diabetic foot ulcer (HCC) 03/29/2015   Assessment:  Infectious Disease: infected R hip s/p I&D 2/19. (repaired on 2/1). VAC placed.  MRSA bacteremia.  Increased back pain - ID recommending MRI spine.  Needs  TEE.  WBC= 8.3, tmax= 100  2/19 Rifampin 2/19 Rocephin >> 2/20 2/18 Vanc 2/18 azactam >>2/19  2/21: VT=7, dose increased to 1gm q8h 2/22: VT=14 (done on time), increase to 1250q8h  Goal of Therapy:  Vancomycin trough level 15-20 mcg/ml  Plan:  -Increase Vanco slightly to /8hr.   Ferdinando Lodge S. Merilynn Finland, PharmD, BCPS Clinical Staff Pharmacist Pager [phone]  Misty Stanley Stillinger 08/11/2015,6:21 PM

## 2015-08-11 NOTE — Progress Notes (Signed)
  Echocardiogram 2D Echocardiogram has been performed.  Cathie Beams 08/11/2015, 11:55 AM

## 2015-08-11 NOTE — Progress Notes (Signed)
Wound vac alarming continuously, cannister changed, tubing checking and not kinked, all clamps open. WOC called to assist. Upper portion of dressing appears saturated in blood. Dr. Roda Shutters called, per Dr. Roda Shutters, remove wound vac and place abd pads over incsision and apply compression wrap. Compression wrap applied as per order.

## 2015-08-11 NOTE — Progress Notes (Signed)
Physical Therapy Treatment Patient Details Name: Jeff Ferrell MRN: [id number] DOB: 25-Nov-1950 Today's Date: 08/11/2015    History of Present Illness Pt is a 65 y/o M w/ recent fx of Rt femoral neck w/ repair on 07/21/15 who presents w/ wound infection, now s/p I&D.  PT w/ MRSA UTI and tachycardia.  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    PT Comments    Pt continues to be limited w/ his mobility due to severe Bil LE muscle spasms and anxiety.  He requires min +2 assist to achieve sitting EOB and max +2 assist for stand pivot transfer to chair.  Follow Up Recommendations  SNF;Supervision/Assistance - 24 hour     Equipment Recommendations  None recommended by PT    Recommendations for Other Services       Precautions / Restrictions Precautions Precautions: Fall Precaution Comments: Wound vac; Bil LE extreme spasms with bil knee flexion maintained throughout, R>L Restrictions Weight Bearing Restrictions: Yes RLE Weight Bearing: Non weight bearing LLE Weight Bearing: Weight bearing as tolerated    Mobility  Bed Mobility Overal bed mobility: Needs Assistance Bed Mobility: Supine to Sit     Supine to sit: Min assist;+2 for physical assistance;HOB elevated     General bed mobility comments: Assist managing Bil LEs, pt w/ heavy use of bed rail and requires assist to elevate trunk.    Transfers Overall transfer level: Needs assistance Equipment used: 2 person hand held assist Transfers: Sit to/from [employer] Sit to Stand: Max assist;+2 physical assistance;+2 safety/equipment Stand pivot transfers: Max assist;+2 physical assistance;+2 safety/equipment       General transfer comment: Use of bed pad w/ gait belt in place to boost pt up from bed.  Pt not adhering to NWB Rt LE.  Trunk flexed and pt does not release firm grip on bed rail during pivot until tactile and verbal cues provided (due to anxiety and  pain).  Pt shouting in pain.  Ambulation/Gait             General Gait Details: Unable to attempt at this time   Stairs            Wheelchair Mobility    Modified Rankin (Stroke Patients Only)       Balance Overall balance assessment: Needs assistance Sitting-balance support: Bilateral upper extremity supported;Feet supported Sitting balance-Leahy Scale: Fair Sitting balance - Comments: posterior lean.  Pt able to sit min guard assist for ~5 minutes Postural control: Posterior lean Standing balance support: Bilateral upper extremity supported;During functional activity Standing balance-Leahy Scale: Zero Standing balance comment: Requires max +2 assist to stand                    Cognition Arousal/Alertness: Awake/alert Behavior During Therapy: Restless;Anxious Overall Cognitive Status: Within Functional Limits for tasks assessed                      Exercises General Exercises - Lower Extremity Ankle Circles/Pumps: AROM;Left;10 reps;Supine    General Comments        Pertinent Vitals/Pain Pain Assessment: 0-10 Pain Score: 10-Worst pain ever Pain Location: Bil LEs w/ spasms Pain Descriptors / Indicators: Spasm;Grimacing;Guarding;Moaning Pain Intervention(s): Limited activity within patient's tolerance;Premedicated before session;Monitored during session;Repositioned    Home Living                      Prior Function  PT Goals (current goals can now be found in the care plan section) Acute Rehab PT Goals Patient Stated Goal: be able to stand PT Goal Formulation: With patient Time For Goal Achievement: 08/27/15 Potential to Achieve Goals: Fair Progress towards PT goals: Progressing toward goals    Frequency  Min 3X/week    PT Plan Current plan remains appropriate    Co-evaluation             End of Session Equipment Utilized During Treatment: Gait belt Activity Tolerance: Patient limited by pain;Other  (comment) (limited by anxiety) Patient left: with call bell/phone within reach;in chair     Time: 1610-9604 PT Time Calculation (min) (ACUTE ONLY): 20 min  Charges:  $Therapeutic Activity: 8-22 mins                    G Codes:      Michail Jewels PT, [place] [phone] Pager: [phone] 08/11/2015, 2:03 PM

## 2015-08-11 NOTE — Progress Notes (Signed)
[facility] TEAM 1 - Stepdown/ICU TEAM Progress Note  Jeff Ferrell ZOX:[id number] DOB: 08/12/1950 DOA: 08/07/2015 PCP: [name], MD  Admit HPI / Brief Narrative: 65 y.o. BM PMHx DM Type 2  not on insulin, Diabetic Foot Ulcer, Chronic Hyponatremia, Left Toe Osteomyelitis, S/P L-spine fusion, Recent fracture Right Femoral neck with repair on 2/1   Presents because of drainage from the wound. He is noted to be tachycardic with a low-grade fever of 99 and a suspicion of a wound infection and therefore is being admitted to the hospital. Orthopedic surgery plans on taking him to the OR for drainage of the wound either today or tomorrow. He is also noted to have a UA that is positive for pyuria. He does not admit to any dysuria, suprapubic pain or increased frequency of micturition.  HPI/Subjective: 2/22  A/O 4, positive right hip pain appropriate for new surgery. Also complains of increasing lower extremity muscle spasms.  Assessment/Plan: Sepsis w/ positive MRSA Pyelonephritis/positive MRSA bacteremia/ positive MRSA Rt hip surgical wound -taken to the OR for I&D 2/19 - care per ID and Ortho - extent of additional evaluation to be delineated by ID  -TTE pending;  -TEE consult placed; spoke with Trish and probably will be completed on Friday.  Muscle spasms bilateral lower extremity -Increase Robaxin 750 mg TID -Continue Valium 5 mg QID PRN  DM Type 2 controlled with complications -2/21 Hemoglobin A1c= 5.7 -Continue sensitive SSI  Chronic Hyponatremia -Workup accomplished during last hospital stay concluded hypovolemia was etiology - stable at present - follow   Hypokalemia -K-Dur 50 mEq  UTI positive MRSA/ Acute urinary retention  -was a problem during his initial hospitalization for hip surgery - now recurring and requiring foley placement - cont antispasmotics and follow  - likely presently due to pyelonephritis/cystitis   Normocytic anemia  -transfuse w/ Hgb now <  7.0 - follow up after transfusion    Code Status: FULL Family Communication: no family present at time of exam Disposition Plan: Resolution sepsis    Consultants: Dr Randall Hiss. ID Dr. Tarry Kos [facility]   Procedure/Significant Events: 2/19 - I&D infected Rt hip wound  2/22 echocardiogram;normal, negative vegetation   Culture 2/18 blood left hand positive staph aureus 2/18 urine positive MRSA 2/19 MRSA by PCR positive 2/19 right leg wound 2 positive MRSA 2/19 blood right AC positive MRSA 2/19 blood right arm NGTD 2/20 blood right forearm/AC pending    Antibiotics: Aztreonam 2/18>> 2/19 Ceftriaxone 2/19 >> 2/19 Rifampin 2/19 >  Vancomycin 2/18 >>   DVT prophylaxis: Lovenox   Devices Wound VAC in place right hip   LINES / TUBES:      Continuous Infusions: . sodium chloride 100 mL/hr at 08/11/15 0948    Objective: VITAL SIGNS: Temp: 97.9 F (36.6 C) (02/22 1158) Temp Source: Oral (02/22 1158) BP: 121/60 mmHg (02/22 1156) Pulse Rate: 99 (02/22 1156) SPO2; FIO2:   Intake/Output Summary (Last 24 hours) at 08/11/15 1419 Last data filed at 08/11/15 1205  Gross per 24 hour  Intake   3780 ml  Output   2885 ml  Net    895 ml     Exam: General: A/O 4, increasing right hip pain/back pain, No acute respiratory distress Eyes: Negative headache, negative scleral hemorrhage ENT: Negative Runny nose, negative gingival bleeding, Neck:  Negative scars, masses, torticollis, lymphadenopathy, JVD Lungs: Clear to auscultation bilaterally without wheezes or crackles Cardiovascular: Tachycardic Regular  rhythm without murmur gallop or rub normal S1 and  S2 Abdomen:negative abdominal pain, nondistended, positive soft, bowel sounds, no rebound, no ascites, no appreciable mass Back: Increasing pain to palpation midline from T8-S2 Extremities: bilateral foot cyanosis, however positive DP/PT pulse, left great toe amputated healed well,  significant onychomycosis all toes. Right lateral hip incision with wound VAC in place draining sanguineous fluid. Hip appropriately swollen, increased pain to palpation for me yesterday., Psychiatric:  Negative depression, negative anxiety, negative fatigue, negative mania  Neurologic:  Cranial nerves II through XII intact, tongue/uvula midline, all extremities muscle strength 5/5, sensation intact throughout, negative dysarthria, negative expressive aphasia, negative receptive aphasia.   Data Reviewed: Basic Metabolic Panel:  Recent Labs Lab 08/07/15 1053 08/07/15 1057 08/07/15 1728 08/08/15 0315 08/09/15 0434 08/10/15 0507 08/11/15 1200  NA 123* 126*  --  128* 127* 128* 126*  K 4.3 4.1  --  3.9 3.5 3.2* 3.1*  CL 89* 90*  --  100* 94* 94* 90*  CO2 22  --   --  21* 21* 24 25  GLUCOSE 163* 159*  --  122* 90 90 197*  BUN 13 13  --  8 <5* <5* <5*  CREATININE 0.76 0.60* 0.65 0.52* 0.62 0.59* 0.53*  CALCIUM 9.1  --   --  8.3* 8.8* 8.8* 8.3*  MG  --   --   --   --   --   --  1.2*   Liver Function Tests:  Recent Labs Lab 08/07/15 1053 08/09/15 0434 08/10/15 0507 08/11/15 1200  AST 21 24 20 26   ALT 17 15* 12* 12*  ALKPHOS 244* 163* 193* 191*  BILITOT 1.6* 1.3* 1.6* 0.8  PROT 7.6 6.1* 5.9* 5.5*  ALBUMIN 2.9* 2.6* 2.4* 2.1*   No results for input(s): LIPASE, AMYLASE in the last 168 hours. No results for input(s): AMMONIA in the last 168 hours. CBC:  Recent Labs Lab 08/07/15 1053  08/08/15 0315 08/09/15 0434 08/09/15 1557 08/10/15 0507 08/11/15 1200  WBC 14.0*  < > 11.5* 6.9 7.8 8.3 7.1  NEUTROABS 11.8*  --   --   --   --   --   --   HGB 8.9*  < > 7.1* 6.5* 7.3* 7.8* 7.5*  HCT 27.4*  < > 22.0* 19.8* 22.2* 23.9* 22.5*  MCV 78.5  < > 78.9 79.8 76.8* 76.6* 77.3*  PLT 464*  < > 332 287 288 315 303  < > = values in this interval not displayed. Cardiac Enzymes: No results for input(s): CKTOTAL, CKMB, CKMBINDEX, TROPONINI in the last 168 hours. BNP (last 3  results)  Recent Labs  06/11/15 1106 07/02/15 0538  BNP 27.7 34.0    ProBNP (last 3 results) No results for input(s): PROBNP in the last 8760 hours.  CBG:  Recent Labs Lab 08/10/15 0742 08/10/15 1148 08/10/15 1747 08/10/15 2122 08/11/15 0807  GLUCAP 96 144* 160* 146* 127*    Recent Results (from the past 240 hour(s))  Blood Culture (routine x 2)     Status: None   Collection Time: 08/07/15 10:47 AM  Result Value Ref Range Status   Specimen Description LEFT ANTECUBITAL  Final   Special Requests BOTTLES DRAWN AEROBIC AND ANAEROBIC 5CC  Final   Culture  Setup Time   Final    GRAM POSITIVE COCCI IN CLUSTERS IN BOTH AEROBIC AND ANAEROBIC BOTTLES CRITICAL RESULT CALLED TO, READ BACK BY AND VERIFIED WITH: Lin Landsman RN [phone] GREEN R CONFIRMED BY M. CAMPBELL    Culture METHICILLIN RESISTANT STAPHYLOCOCCUS AUREUS  Final  Report Status 08/10/2015 FINAL  Final   Organism ID, Bacteria METHICILLIN RESISTANT STAPHYLOCOCCUS AUREUS  Final      Susceptibility   Methicillin resistant staphylococcus aureus - MIC*    CIPROFLOXACIN >=8 RESISTANT Resistant     ERYTHROMYCIN >=8 RESISTANT Resistant     GENTAMICIN <=0.5 SENSITIVE Sensitive     OXACILLIN >=4 RESISTANT Resistant     TETRACYCLINE <=1 SENSITIVE Sensitive     VANCOMYCIN <=0.5 SENSITIVE Sensitive     TRIMETH/SULFA >=320 RESISTANT Resistant     CLINDAMYCIN <=0.25 SENSITIVE Sensitive     RIFAMPIN <=0.5 SENSITIVE Sensitive     Inducible Clindamycin NEGATIVE Sensitive     * METHICILLIN RESISTANT STAPHYLOCOCCUS AUREUS  Wound culture     Status: None   Collection Time: 08/07/15 10:53 AM  Result Value Ref Range Status   Specimen Description WOUND RIGHT HIP  Final   Special Requests SKIN DEEP EXCISION   Final   Gram Stain   Final    NO WBC SEEN NO SQUAMOUS EPITHELIAL CELLS SEEN NO ORGANISMS SEEN Performed at [facility]    Culture   Final    ABUNDANT METHICILLIN RESISTANT STAPHYLOCOCCUS AUREUS Note: RIFAMPIN  AND GENTAMICIN SHOULD NOT BE USED AS SINGLE DRUGS FOR TREATMENT OF STAPH INFECTIONS. This organism DOES NOT demonstrate inducible Clindamycin resistance in vitro. CRITICAL RESULT CALLED TO, READ BACK BY AND VERIFIED WITH: WHITNEY DAVIS @  9:10 AM 08/10/15 BY DWEEKS Performed at [facility]    Report Status 08/10/2015 FINAL  Final   Organism ID, Bacteria METHICILLIN RESISTANT STAPHYLOCOCCUS AUREUS  Final      Susceptibility   Methicillin resistant staphylococcus aureus - MIC*    CLINDAMYCIN <=0.25 SENSITIVE Sensitive     ERYTHROMYCIN >=8 RESISTANT Resistant     GENTAMICIN <=0.5 SENSITIVE Sensitive     LEVOFLOXACIN >=8 RESISTANT Resistant     OXACILLIN >=4 RESISTANT Resistant     RIFAMPIN <=0.5 SENSITIVE Sensitive     TRIMETH/SULFA >=320 RESISTANT Resistant     VANCOMYCIN <=0.5 SENSITIVE Sensitive     TETRACYCLINE <=1 SENSITIVE Sensitive     * ABUNDANT METHICILLIN RESISTANT STAPHYLOCOCCUS AUREUS  Blood Culture (routine x 2)     Status: None   Collection Time: 08/07/15 11:18 AM  Result Value Ref Range Status   Specimen Description BLOOD LEFT HAND  Final   Special Requests BOTTLES DRAWN AEROBIC ONLY 10CC  Final   Culture  Setup Time   Final    GRAM POSITIVE COCCI IN CLUSTERS AEROBIC BOTTLE ONLY CRITICAL RESULT CALLED TO, READ BACK BY AND VERIFIED WITH: T. IRBY RN [phone] GREEN R CONFIRMED BY M. CAMPBELL    Culture   Final    STAPHYLOCOCCUS AUREUS SUSCEPTIBILITIES PERFORMED ON PREVIOUS CULTURE WITHIN THE LAST 5 DAYS.    Report Status 08/10/2015 FINAL  Final  Urine culture     Status: None   Collection Time: 08/07/15 12:23 PM  Result Value Ref Range Status   Specimen Description URINE, CATHETERIZED  Final   Special Requests NONE  Final   Culture   Final    >=100,000 COLONIES/mL METHICILLIN RESISTANT STAPHYLOCOCCUS AUREUS   Report Status 08/09/2015 FINAL  Final   Organism ID, Bacteria METHICILLIN RESISTANT STAPHYLOCOCCUS AUREUS  Final      Susceptibility   Methicillin  resistant staphylococcus aureus - MIC*    CIPROFLOXACIN >=8 RESISTANT Resistant     GENTAMICIN <=0.5 SENSITIVE Sensitive     NITROFURANTOIN <=16 SENSITIVE Sensitive     OXACILLIN >=4 RESISTANT  Resistant     TETRACYCLINE <=1 SENSITIVE Sensitive     VANCOMYCIN 1 SENSITIVE Sensitive     TRIMETH/SULFA >=320 RESISTANT Resistant     CLINDAMYCIN <=0.25 SENSITIVE Sensitive     RIFAMPIN <=0.5 SENSITIVE Sensitive     Inducible Clindamycin NEGATIVE Sensitive     * >=100,000 COLONIES/mL METHICILLIN RESISTANT STAPHYLOCOCCUS AUREUS  MRSA PCR Screening     Status: Abnormal   Collection Time: 08/08/15  5:32 AM  Result Value Ref Range Status   MRSA by PCR POSITIVE (A) NEGATIVE Final    Comment:        The GeneXpert MRSA Assay (FDA approved for NASAL specimens only), is one component of a comprehensive MRSA colonization surveillance program. It is not intended to diagnose MRSA infection nor to guide or monitor treatment for MRSA infections. RESULT CALLED TO, READ BACK BY AND VERIFIED WITH: RN GORDON,R AT 1002 [id number] MARTINB   Anaerobic culture     Status: None (Preliminary result)   Collection Time: 08/08/15  8:28 AM  Result Value Ref Range Status   Specimen Description WOUND RIGHT LEG  Final   Special Requests POF VANCOMYCIN LOWER RIGHT LEG INCISION PART A   Final   Gram Stain   Final    ABUNDANT WBC PRESENT, PREDOMINANTLY PMN NO SQUAMOUS EPITHELIAL CELLS SEEN FEW GRAM POSITIVE COCCI IN CLUSTERS IN PAIRS Performed at [facility]    Culture   Final    NO ANAEROBES ISOLATED; CULTURE IN PROGRESS FOR 5 DAYS Performed at [facility]    Report Status PENDING  Incomplete  Wound culture     Status: None   Collection Time: 08/08/15  8:28 AM  Result Value Ref Range Status   Specimen Description WOUND RIGHT LEG  Final   Special Requests POF VANCOMYCIN RIGHT LOWER LEG INCISION PART A  Final   Gram Stain   Final    FEW WBC PRESENT, PREDOMINANTLY PMN NO SQUAMOUS EPITHELIAL  CELLS SEEN RARE GRAM POSITIVE COCCI IN PAIRS Performed at [facility]    Culture   Final    MODERATE METHICILLIN RESISTANT STAPHYLOCOCCUS AUREUS Note: RIFAMPIN AND GENTAMICIN SHOULD NOT BE USED AS SINGLE DRUGS FOR TREATMENT OF STAPH INFECTIONS. This organism DOES NOT demonstrate inducible Clindamycin resistance in vitro. CRITICAL RESULT CALLED TO, READ BACK BY AND VERIFIED WITH: CENLINA S 2/22 @  0920 BY REAMM Performed at [facility]    Report Status 08/11/2015 FINAL  Final   Organism ID, Bacteria METHICILLIN RESISTANT STAPHYLOCOCCUS AUREUS  Final      Susceptibility   Methicillin resistant staphylococcus aureus - MIC*    CLINDAMYCIN <=0.25 SENSITIVE Sensitive     ERYTHROMYCIN >=8 RESISTANT Resistant     GENTAMICIN <=0.5 SENSITIVE Sensitive     LEVOFLOXACIN >=8 RESISTANT Resistant     OXACILLIN >=4 RESISTANT Resistant     RIFAMPIN <=0.5 SENSITIVE Sensitive     TRIMETH/SULFA >=320 RESISTANT Resistant     VANCOMYCIN 1 SENSITIVE Sensitive     TETRACYCLINE <=1 SENSITIVE Sensitive     * MODERATE METHICILLIN RESISTANT STAPHYLOCOCCUS AUREUS  Anaerobic culture     Status: None (Preliminary result)   Collection Time: 08/08/15  8:49 AM  Result Value Ref Range Status   Specimen Description WOUND RIGHT LEG  Final   Special Requests POF VANCOMYCIN PROXIMAL RIGHT LOWER LEG PART B  Final   Gram Stain   Final    ABUNDANT WBC PRESENT, PREDOMINANTLY PMN NO SQUAMOUS EPITHELIAL CELLS SEEN FEW GRAM POSITIVE COCCI  IN PAIRS IN CLUSTERS Performed at [facility]    Culture   Final    NO ANAEROBES ISOLATED; CULTURE IN PROGRESS FOR 5 DAYS Performed at [facility]    Report Status PENDING  Incomplete  Wound culture     Status: None   Collection Time: 08/08/15  8:49 AM  Result Value Ref Range Status   Specimen Description WOUND RIGHT LEG  Final   Special Requests POF VANCOMYCIN PROXIMAL RIGHT LEG INCISON PART B  Final   Gram Stain   Final    FEW WBC PRESENT,  PREDOMINANTLY PMN NO SQUAMOUS EPITHELIAL CELLS SEEN FEW GRAM POSITIVE COCCI IN PAIRS IN CLUSTERS Performed at [facility]    Culture   Final    MODERATE METHICILLIN RESISTANT STAPHYLOCOCCUS AUREUS Note: RIFAMPIN AND GENTAMICIN SHOULD NOT BE USED AS SINGLE DRUGS FOR TREATMENT OF STAPH INFECTIONS. This organism DOES NOT demonstrate inducible Clindamycin resistance in vitro. CRITICAL RESULT CALLED TO, READ BACK BY AND VERIFIED WITH: CENLINA S 2/22 @  0920 BY REAMM Performed at [facility]    Report Status 08/11/2015 FINAL  Final   Organism ID, Bacteria METHICILLIN RESISTANT STAPHYLOCOCCUS AUREUS  Final      Susceptibility   Methicillin resistant staphylococcus aureus - MIC*    CLINDAMYCIN <=0.25 SENSITIVE Sensitive     ERYTHROMYCIN >=8 RESISTANT Resistant     GENTAMICIN <=0.5 SENSITIVE Sensitive     LEVOFLOXACIN >=8 RESISTANT Resistant     OXACILLIN >=4 RESISTANT Resistant     RIFAMPIN <=0.5 SENSITIVE Sensitive     TRIMETH/SULFA >=320 RESISTANT Resistant     VANCOMYCIN 1 SENSITIVE Sensitive     TETRACYCLINE <=1 SENSITIVE Sensitive     * MODERATE METHICILLIN RESISTANT STAPHYLOCOCCUS AUREUS  Culture, blood (Routine X 2) w Reflex to ID Panel     Status: None   Collection Time: 08/08/15 12:40 PM  Result Value Ref Range Status   Specimen Description BLOOD RIGHT ANTECUBITAL  Final   Special Requests IN PEDIATRIC BOTTLE 2CC  Final   Culture  Setup Time   Final    GRAM POSITIVE COCCI IN CLUSTERS PEDIATRIC BOTTLE CRITICAL RESULT CALLED TO, READ BACK BY AND VERIFIED WITH: W DAVIS,RN AT 1043 08/09/15 BY L BENFIELD    Culture METHICILLIN RESISTANT STAPHYLOCOCCUS AUREUS  Final   Report Status 08/11/2015 FINAL  Final   Organism ID, Bacteria METHICILLIN RESISTANT STAPHYLOCOCCUS AUREUS  Final      Susceptibility   Methicillin resistant staphylococcus aureus - MIC*    CIPROFLOXACIN >=8 RESISTANT Resistant     ERYTHROMYCIN >=8 RESISTANT Resistant     GENTAMICIN <=0.5 SENSITIVE  Sensitive     OXACILLIN >=4 RESISTANT Resistant     TETRACYCLINE <=1 SENSITIVE Sensitive     VANCOMYCIN 1 SENSITIVE Sensitive     TRIMETH/SULFA >=320 RESISTANT Resistant     CLINDAMYCIN <=0.25 SENSITIVE Sensitive     RIFAMPIN <=0.5 SENSITIVE Sensitive     Inducible Clindamycin NEGATIVE Sensitive     * METHICILLIN RESISTANT STAPHYLOCOCCUS AUREUS  Culture, blood (Routine X 2) w Reflex to ID Panel     Status: None (Preliminary result)   Collection Time: 08/08/15 12:45 PM  Result Value Ref Range Status   Specimen Description BLOOD RIGHT ARM  Final   Special Requests BOTTLES DRAWN AEROBIC ONLY  5CC  Final   Culture NO GROWTH 3 DAYS  Final   Report Status PENDING  Incomplete  Culture, blood (Routine X 2) w Reflex to ID Panel  Status: None (Preliminary result)   Collection Time: 08/09/15 12:09 PM  Result Value Ref Range Status   Specimen Description BLOOD BLOOD RIGHT FOREARM  Final   Special Requests IN PEDIATRIC BOTTLE .3CC  Final   Culture NO GROWTH 2 DAYS  Final   Report Status PENDING  Incomplete  Culture, blood (Routine X 2) w Reflex to ID Panel     Status: None (Preliminary result)   Collection Time: 08/09/15 12:15 PM  Result Value Ref Range Status   Specimen Description BLOOD RIGHT ANTECUBITAL  Final   Special Requests BOTTLES DRAWN AEROBIC ONLY 6CC  Final   Culture NO GROWTH 2 DAYS  Final   Report Status PENDING  Incomplete     Studies:  Recent x-ray studies have been reviewed in detail by the Attending Physician  Scheduled Meds:  Scheduled Meds: . sodium chloride   Intravenous Once  . Chlorhexidine Gluconate Cloth  6 each Topical Q0600  . docusate sodium  100 mg Oral BID  . enoxaparin (LOVENOX) injection  40 mg Subcutaneous Q24H  . feeding supplement (ENSURE ENLIVE)  237 mL Oral TID BM  . gabapentin  100 mg Oral TID  . insulin aspart  0-5 Units Subcutaneous QHS  . insulin aspart  0-9 Units Subcutaneous TID WC  . methocarbamol  750 mg Oral QID  . mupirocin ointment   1 application Nasal BID  . potassium chloride  50 mEq Oral Once  . rifampin  300 mg Oral Daily  . tamsulosin  0.4 mg Oral Daily  . vancomycin  1,000 mg Intravenous Q8H    Time spent on care of this patient: 40 mins   WOODS, Roselind Messier , MD  [facility] Office  [phone] Pager [phone]  On-Call/Text Page:      [email or web address]      password [email or web address]  If 7PM-7AM, please contact night-coverage [email or web address] Password [email or web address] 08/11/2015, 2:19 PM   LOS: 4 days   Care during the described time interval was provided by me .  I have reviewed this patient's available data, including medical history, events of note, physical examination, and all test results as part of my evaluation. I have personally reviewed and interpreted all radiology studies.   Carolyne Littles, MD [phone] Pager

## 2015-08-11 NOTE — Consult Note (Addendum)
WOC wound consult note Reason for Consult: Consult requested to assist with troubleshooting Vac machine, which was alarming "blockage." Vac in place to right hip post-op wounds.  The dressing appears to be saturated with bloody drainage and there is also bloody drainage in the cannister. Vac therapy will not be effective if it keeps becoming occluded with blood.  Bedside nurse called ortho service to discuss plan of care and Vac therapy was ordered to be discontinued. Wound type: Full thickness post-op incision, sutures in place and well-approximated over 2 wounds.   Drainage (amount, consistency, odor) Previous dressing was saturated with old bloody drainage when removed. There is currently no active bleeding, small amt pink fluid weeping from incision line which has generalized edema and is tender to the touch. Periwound: Generalized edema and bruising surrounding location. Dressing procedure/placement/frequency: Applied ABD pads and Ace wrap as requested by ortho service.  Please refer to ortho team for further plan of care. Please re-consult if further assistance is needed.  Thank-you,  Jeff Ferrell ECammie McgeeN, RN, CWOCN, [name], CNS [phone]

## 2015-08-11 NOTE — Progress Notes (Signed)
Cultures growing staph Continue iVAC until ready for discharge. Abx per ID Stable from ortho stand point  N. Glee Arvin, MD [facility] [phone] 9:56 AM

## 2015-08-12 ENCOUNTER — Encounter (HOSPITAL_COMMUNITY): Payer: Self-pay | Admitting: Radiology

## 2015-08-12 ENCOUNTER — Inpatient Hospital Stay (HOSPITAL_COMMUNITY): Payer: Medicare Other

## 2015-08-12 ENCOUNTER — Encounter (HOSPITAL_COMMUNITY)
Admission: EM | Disposition: A | Discharge status: Home Health | Payer: Self-pay | Source: Home / Self Care | Attending: Internal Medicine

## 2015-08-12 ENCOUNTER — Inpatient Hospital Stay (HOSPITAL_COMMUNITY): Payer: Medicare Other | Admitting: Anesthesiology

## 2015-08-12 DIAGNOSIS — M549 Dorsalgia, unspecified: Secondary | ICD-10-CM

## 2015-08-12 DIAGNOSIS — T847XXA Infection and inflammatory reaction due to other internal orthopedic prosthetic devices, implants and grafts, initial encounter: Secondary | ICD-10-CM

## 2015-08-12 DIAGNOSIS — L02213 Cutaneous abscess of chest wall: Secondary | ICD-10-CM | POA: Diagnosis present

## 2015-08-12 DIAGNOSIS — L0291 Cutaneous abscess, unspecified: Secondary | ICD-10-CM | POA: Insufficient documentation

## 2015-08-12 HISTORY — PX: RADIOLOGY WITH ANESTHESIA: SHX6223

## 2015-08-12 LAB — COMPREHENSIVE METABOLIC PANEL
ALBUMIN: 2.2 g/dL — AB (ref 3.5–5.0)
ALT: 10 U/L — ABNORMAL LOW (ref 17–63)
ANION GAP: 8 (ref 5–15)
AST: 21 U/L (ref 15–41)
Alkaline Phosphatase: 178 U/L — ABNORMAL HIGH (ref 38–126)
BUN: <5 mg/dL — ABNORMAL LOW (ref 6–20)
CO2: 26 mmol/L (ref 22–32)
Calcium: 8.3 mg/dL — ABNORMAL LOW (ref 8.9–10.3)
Chloride: 96 mmol/L — ABNORMAL LOW (ref 101–111)
Creatinine, Ser: 0.44 mg/dL — ABNORMAL LOW (ref 0.61–1.24)
GFR calc Af Amer: >60 mL/min (ref >60)
GFR calc non Af Amer: >60 mL/min (ref >60)
GLUCOSE: 108 mg/dL — AB (ref 65–99)
POTASSIUM: 3.6 mmol/L (ref 3.5–5.1)
SODIUM: 130 mmol/L — AB (ref 135–145)
TOTAL PROTEIN: 5.7 g/dL — AB (ref 6.5–8.1)
Total Bilirubin: 0.6 mg/dL (ref 0.3–1.2)

## 2015-08-12 LAB — CBC WITH DIFFERENTIAL/PLATELET
BASOS ABS: 0.1 10*3/uL (ref 0.0–0.1)
Basophils Relative: 1 %
Eosinophils Absolute: 0.2 10*3/uL (ref 0.0–0.7)
Eosinophils Relative: 3 %
HCT: 22.8 % — ABNORMAL LOW (ref 39.0–52.0)
HEMOGLOBIN: 7.7 g/dL — AB (ref 13.0–17.0)
LYMPHS PCT: 16 %
Lymphs Abs: 1.2 10*3/uL (ref 0.7–4.0)
MCH: 26.2 pg (ref 26.0–34.0)
MCHC: 33.8 g/dL (ref 30.0–36.0)
MCV: 77.6 fL — ABNORMAL LOW (ref 78.0–100.0)
MONOS PCT: 15 %
Monocytes Absolute: 1.1 10*3/uL — ABNORMAL HIGH (ref 0.1–1.0)
NEUTROS ABS: 4.7 10*3/uL (ref 1.7–7.7)
NEUTROS PCT: 65 %
Platelets: 346 10*3/uL (ref 150–400)
RBC: 2.94 MIL/uL — ABNORMAL LOW (ref 4.22–5.81)
RDW: 14.4 % (ref 11.5–15.5)
WBC: 7.3 10*3/uL (ref 4.0–10.5)

## 2015-08-12 LAB — GLUCOSE, CAPILLARY
GLUCOSE-CAPILLARY: 106 mg/dL — AB (ref 65–99)
GLUCOSE-CAPILLARY: 92 mg/dL (ref 65–99)
GLUCOSE-CAPILLARY: 108 mg/dL — AB (ref 65–99)
Glucose-Capillary: 154 mg/dL — ABNORMAL HIGH (ref 65–99)

## 2015-08-12 LAB — MAGNESIUM: MAGNESIUM: 1.3 mg/dL — AB (ref 1.7–2.4)

## 2015-08-12 SURGERY — RADIOLOGY WITH ANESTHESIA
Anesthesia: General

## 2015-08-12 MED ORDER — PROMETHAZINE HCL 25 MG/ML IJ SOLN: 6.2500 mg | INTRAMUSCULAR | Status: DC | PRN

## 2015-08-12 MED ORDER — ASPIRIN EC 325 MG PO TBEC
325.0000 mg | DELAYED_RELEASE_TABLET | Freq: Every day | ORAL | Status: DC
  Administered 2015-08-12 – 2015-09-03 (×14): 325 mg via ORAL
  Filled 2015-08-12 (×17): qty 1

## 2015-08-12 MED ORDER — MIDODRINE HCL 5 MG PO TABS
10.0000 mg | ORAL_TABLET | Freq: Once | ORAL | Status: AC
  Administered 2015-08-12: 10 mg via ORAL
  Filled 2015-08-12: qty 2

## 2015-08-12 MED ORDER — GADOBENATE DIMEGLUMINE 529 MG/ML IV SOLN
15.0000 mL | Freq: Once | INTRAVENOUS | Status: AC
Start: 2015-08-12 — End: 2015-08-12
  Administered 2015-08-12: 15 mL via INTRAVENOUS

## 2015-08-12 MED ORDER — FENTANYL CITRATE (PF) 100 MCG/2ML IJ SOLN: 25.0000 ug | INTRAMUSCULAR | Status: DC | PRN

## 2015-08-12 MED ORDER — MIDODRINE HCL 5 MG PO TABS
10.0000 mg | ORAL_TABLET | ORAL | Status: AC
  Filled 2015-08-12: qty 2

## 2015-08-12 MED ORDER — LACTATED RINGERS IV SOLN
INTRAVENOUS | Status: DC
  Administered 2015-08-12 (×2): via INTRAVENOUS

## 2015-08-12 NOTE — Progress Notes (Signed)
Pt R hip incision site noted to be saturated with serosanguinous blood.  Dressing changed and ABD pad with hypafix taped over site.  HR increased from 100-110s to 130s.  K. Schorr on call notified and small bolus and CBC draw ordered.  Ortho on call notified.  Will continue to monitor.

## 2015-08-12 NOTE — Anesthesia Postprocedure Evaluation (Signed)
Anesthesia Post Note  Patient: Ranbir Chew  Procedure(s) Performed: Procedure(s) (LRB): LUMBAR WITH WITHOUT CONTRAST THORACIC WITH WITHOUT CONTRAST (RADIOLOGY WITH ANESTHESIA) (N/A)  Patient location during evaluation: PACU Anesthesia Type: General Level of consciousness: awake and alert Pain management: pain level controlled Vital Signs Assessment: post-procedure vital signs reviewed and stable Respiratory status: spontaneous breathing, nonlabored ventilation, respiratory function stable and patient connected to nasal cannula oxygen Cardiovascular status: blood pressure returned to baseline and stable Postop Assessment: no signs of nausea or vomiting Anesthetic complications: no    Last Vitals:  Filed Vitals:   08/12/15 1317 08/12/15 1319  BP:  101/62  Pulse:  92  Temp: 36.8 C   Resp:  18    Last Pain:  Filed Vitals:   08/12/15 1324  PainSc: 9                  Milayna Rotenberg S

## 2015-08-12 NOTE — Progress Notes (Signed)
[place] TEAM 1 - Stepdown/ICU TEAM Progress Note  Ethon Wymer ZOX:[id number] DOB: 04-13-1951 DOA: 08/07/2015 PCP: [name], MD  Admit HPI / Brief Narrative: 65 y.o. BM PMHx DM Type 2  not on insulin, Diabetic Foot Ulcer, Chronic Hyponatremia, Left Toe Osteomyelitis, S/P L-spine fusion, Recent fracture Right Femoral neck with repair on 2/1   Presents because of drainage from the wound. He is noted to be tachycardic with a low-grade fever of 99 and a suspicion of a wound infection and therefore is being admitted to the hospital. Orthopedic surgery plans on taking him to the OR for drainage of the wound either today or tomorrow. He is also noted to have a UA that is positive for pyuria. He does not admit to any dysuria, suprapubic pain or increased frequency of micturition.  HPI/Subjective: 2/23 A/O 4, positive right hip pain appropriate for new surgery. Also complains of increasing lower extremity muscle spasms.  Assessment/Plan: Positive MRSA Bacteremia/ positive MRSA Rt hip surgical wound -taken to the OR for I&D 2/19 - care per ID and Ortho - extent of additional evaluation to be delineated by ID  -TTE; normal see results below  -TEE consult placed; spoke with Trish and probably will be completed 1500 on Friday.  Sepsis w/ positive MRSA Pyelonephritis & Hydronephrosis /Acute urinary retention -Place Foley for decompression -Continue current antibiotics per ID  Left chest wall Phlegmon/Abscess -ID placed consult for IR aspiration for Friday 24 February   Bibasilar Pneumonia  -Continue current antibiotics  Bilateral pleural effusions -May require thoracentesis but will hold off until IR drains chest wall abscess  Muscle spasms bilateral lower extremity -Continue Robaxin 750 mg TID -Continue Valium 5 mg QID PRN  DM Type 2 controlled with complications -2/21 Hemoglobin A1c= 5.7 -Continue sensitive SSI  Chronic Hyponatremia -Workup accomplished during last  hospital stay concluded hypovolemia was etiology - stable at present - follow   Hypokalemia -continue to monitor  Normocytic anemia  -transfuse w/ Hgb now < 7.0 - follow up after transfusion    Code Status: FULL Family Communication: no family present at time of exam Disposition Plan: Resolution sepsis    Consultants: Dr Rich [place]. ID Dr. Tarry Kos [facility]   Procedure/Significant Events: 2/19 - I&D infected Rt hip wound  2/22 echocardiogram;normal, negative vegetation 2/23 MRI T-spine/L-spine;-Massively distended urinary bladder, causing  2dary Hydronephrosis. -No evidence of acute thoracic or lumbar spinal infection. -Rim enhancing fluid collections in the posterior left chest wall fluid - associated with multiple displaced rib fractures - and in the right gluteal muscle fluid collection are suspicious for phlegmon/abscess - Lt >>Rt pleural effusions with bilateral lower lobe pneumonia. -Degenerative thoracic spinal stenosis Stable degenerative lumbar spinal stenosis; moderate degenerative cervical spinal stenosis   Culture 2/18 blood left hand positive staph aureus 2/18 urine positive MRSA 2/19 MRSA by PCR positive 2/19 right leg wound 2 positive MRSA 2/19 blood right AC positive MRSA 2/19 blood right arm NGTD 2/20 blood right forearm/AC pending 2/23 sputum pending    Antibiotics: Aztreonam 2/18>> 2/19 Ceftriaxone 2/19 >> 2/19 Rifampin 2/19 >  Vancomycin 2/18 >>   DVT prophylaxis: Lovenox   Devices Wound VAC in place right hip   LINES / TUBES:      Continuous Infusions: . sodium chloride 100 mL/hr at 08/12/15 1345  . lactated ringers 50 mL/hr at 08/12/15 0849    Objective: VITAL SIGNS: Temp: 98.4 F (36.9 C) (02/23 1345) Temp Source: Oral (02/23 1345) BP: 111/70 mmHg (02/23 1345)  Pulse Rate: 94 (02/23 1345) SPO2; FIO2:   Intake/Output Summary (Last 24 hours) at 08/12/15 1540 Last data filed at 08/12/15  1520  Gross per 24 hour  Intake 3791.67 ml  Output   6200 ml  Net -2408.33 ml     Exam: General: A/O 4, increasing right hip pain/back pain, No acute respiratory distress Eyes: Negative headache, negative scleral hemorrhage ENT: Negative Runny nose, negative gingival bleeding, Neck:  Negative scars, masses, torticollis, lymphadenopathy, JVD Lungs: Clear to auscultation bilaterally, with decreased breath sounds at LLB, without wheezes or crackles Cardiovascular: Tachycardic Regular  rhythm without murmur gallop or rub normal S1 and S2 Abdomen:negative abdominal pain, nondistended, positive soft, bowel sounds, no rebound, no ascites, no appreciable mass Back: Increasing pain to palpation midline from T8-S2 Extremities: bilateral foot cyanosis, however positive DP/PT pulse, left great toe amputated healed well, significant onychomycosis all toes. Right lateral hip incision with wound VAC replaced today, draining sanguineous fluid. Hip  swollen (but significantly decreased), Psychiatric:  Negative depression, negative anxiety, negative fatigue, negative mania  Neurologic:  Cranial nerves II through XII intact, tongue/uvula midline, all extremities muscle strength 5/5, sensation intact throughout, negative dysarthria, negative expressive aphasia, negative receptive aphasia.   Data Reviewed: Basic Metabolic Panel:  Recent Labs Lab 08/08/15 0315 08/09/15 0434 08/10/15 0507 08/11/15 1200 08/12/15 0540  NA 128* 127* 128* 126* 130*  K 3.9 3.5 3.2* 3.1* 3.6  CL 100* 94* 94* 90* 96*  CO2 21* 21* GLUCOSE 122* 90 90 197* 108*  BUN 8 <5* <5* <5* <5*  CREATININE 0.52* 0.62 0.59* 0.53* 0.44*  CALCIUM 8.3* 8.8* 8.8* 8.3* 8.3*  MG  --   --   --  1.2* 1.3*   Liver Function Tests:  Recent Labs Lab 08/07/15 1053 08/09/15 0434 08/10/15 0507 08/11/15 1200 08/12/15 0540  AST ALT 17 15* 12* 12* 10*  ALKPHOS 244* 163* 193* 191* 178*  BILITOT 1.6* 1.3* 1.6* 0.8  0.6  PROT 7.6 6.1* 5.9* 5.5* 5.7*  ALBUMIN 2.9* 2.6* 2.4* 2.1* 2.2*   No results for input(s): LIPASE, AMYLASE in the last 168 hours. No results for input(s): AMMONIA in the last 168 hours. CBC:  Recent Labs Lab 08/07/15 1053  08/09/15 1557 08/10/15 0507 08/11/15 1200 08/11/15 2030 08/12/15 0540  WBC 14.0*  < > 7.8 8.3 7.1 7.0 7.3  NEUTROABS 11.8*  --   --   --   --  5.0 4.7  HGB 8.9*  < > 7.3* 7.8* 7.5* 7.9* 7.7*  HCT 27.4*  < > 22.2* 23.9* 22.5* 23.5* 22.8*  MCV 78.5  < > 76.8* 76.6* 77.3* 76.1* 77.6*  PLT 464*  < > 288 315 303 364 346  < > = values in this interval not displayed. Cardiac Enzymes: No results for input(s): CKTOTAL, CKMB, CKMBINDEX, TROPONINI in the last 168 hours. BNP (last 3 results)  Recent Labs  06/11/15 1106 07/02/15 0538  BNP 27.7 34.0    ProBNP (last 3 results) No results for input(s): PROBNP in the last 8760 hours.  CBG:  Recent Labs Lab 08/11/15 1200 08/11/15 1649 08/11/15 2100 08/12/15 0753 08/12/15 1229  GLUCAP 175* 134* 155* 106* 92    Recent Results (from the past 240 hour(s))  Blood Culture (routine x 2)     Status: None   Collection Time: 08/07/15 10:47 AM  Result Value Ref Range Status   Specimen Description LEFT ANTECUBITAL  Final   Special Requests BOTTLES  DRAWN AEROBIC AND ANAEROBIC 5CC  Final   Culture  Setup Time   Final    GRAM POSITIVE COCCI IN CLUSTERS IN BOTH AEROBIC AND ANAEROBIC BOTTLES CRITICAL RESULT CALLED TO, READ BACK BY AND VERIFIED WITH: TKayren Eaves RN [phone] GREEN R CONFIRMED BY M. CAMPBELL    Culture METHICILLIN RESISTANT STAPHYLOCOCCUS AUREUS  Final   Report Status 08/10/2015 FINAL  Final   Organism ID, Bacteria METHICILLIN RESISTANT STAPHYLOCOCCUS AUREUS  Final      Susceptibility   Methicillin resistant staphylococcus aureus - MIC*    CIPROFLOXACIN >=8 RESISTANT Resistant     ERYTHROMYCIN >=8 RESISTANT Resistant     GENTAMICIN <=0.5 SENSITIVE Sensitive     OXACILLIN >=4 RESISTANT Resistant      TETRACYCLINE <=1 SENSITIVE Sensitive     VANCOMYCIN <=0.5 SENSITIVE Sensitive     TRIMETH/SULFA >=320 RESISTANT Resistant     CLINDAMYCIN <=0.25 SENSITIVE Sensitive     RIFAMPIN <=0.5 SENSITIVE Sensitive     Inducible Clindamycin NEGATIVE Sensitive     * METHICILLIN RESISTANT STAPHYLOCOCCUS AUREUS  Wound culture     Status: None   Collection Time: 08/07/15 10:53 AM  Result Value Ref Range Status   Specimen Description WOUND RIGHT HIP  Final   Special Requests SKIN DEEP EXCISION   Final   Gram Stain   Final    NO WBC SEEN NO SQUAMOUS EPITHELIAL CELLS SEEN NO ORGANISMS SEEN Performed at [facility]    Culture   Final    ABUNDANT METHICILLIN RESISTANT STAPHYLOCOCCUS AUREUS Note: RIFAMPIN AND GENTAMICIN SHOULD NOT BE USED AS SINGLE DRUGS FOR TREATMENT OF STAPH INFECTIONS. This organism DOES NOT demonstrate inducible Clindamycin resistance in vitro. CRITICAL RESULT CALLED TO, READ BACK BY AND VERIFIED WITH: WHITNEY DAVIS @  9:10 AM 08/10/15 BY DWEEKS Performed at [facility]    Report Status 08/10/2015 FINAL  Final   Organism ID, Bacteria METHICILLIN RESISTANT STAPHYLOCOCCUS AUREUS  Final      Susceptibility   Methicillin resistant staphylococcus aureus - MIC*    CLINDAMYCIN <=0.25 SENSITIVE Sensitive     ERYTHROMYCIN >=8 RESISTANT Resistant     GENTAMICIN <=0.5 SENSITIVE Sensitive     LEVOFLOXACIN >=8 RESISTANT Resistant     OXACILLIN >=4 RESISTANT Resistant     RIFAMPIN <=0.5 SENSITIVE Sensitive     TRIMETH/SULFA >=320 RESISTANT Resistant     VANCOMYCIN <=0.5 SENSITIVE Sensitive     TETRACYCLINE <=1 SENSITIVE Sensitive     * ABUNDANT METHICILLIN RESISTANT STAPHYLOCOCCUS AUREUS  Blood Culture (routine x 2)     Status: None   Collection Time: 08/07/15 11:18 AM  Result Value Ref Range Status   Specimen Description BLOOD LEFT HAND  Final   Special Requests BOTTLES DRAWN AEROBIC ONLY 10CC  Final   Culture  Setup Time   Final    GRAM POSITIVE COCCI IN  CLUSTERS AEROBIC BOTTLE ONLY CRITICAL RESULT CALLED TO, READ BACK BY AND VERIFIED WITH: T. IRBY RN [phone] GREEN R CONFIRMED BY M. CAMPBELL    Culture   Final    STAPHYLOCOCCUS AUREUS SUSCEPTIBILITIES PERFORMED ON PREVIOUS CULTURE WITHIN THE LAST 5 DAYS.    Report Status 08/10/2015 FINAL  Final  Urine culture     Status: None   Collection Time: 08/07/15 12:23 PM  Result Value Ref Range Status   Specimen Description URINE, CATHETERIZED  Final   Special Requests NONE  Final   Culture   Final    >=100,000 COLONIES/mL METHICILLIN RESISTANT STAPHYLOCOCCUS AUREUS  Report Status 08/09/2015 FINAL  Final   Organism ID, Bacteria METHICILLIN RESISTANT STAPHYLOCOCCUS AUREUS  Final      Susceptibility   Methicillin resistant staphylococcus aureus - MIC*    CIPROFLOXACIN >=8 RESISTANT Resistant     GENTAMICIN <=0.5 SENSITIVE Sensitive     NITROFURANTOIN <=16 SENSITIVE Sensitive     OXACILLIN >=4 RESISTANT Resistant     TETRACYCLINE <=1 SENSITIVE Sensitive     VANCOMYCIN 1 SENSITIVE Sensitive     TRIMETH/SULFA >=320 RESISTANT Resistant     CLINDAMYCIN <=0.25 SENSITIVE Sensitive     RIFAMPIN <=0.5 SENSITIVE Sensitive     Inducible Clindamycin NEGATIVE Sensitive     * >=100,000 COLONIES/mL METHICILLIN RESISTANT STAPHYLOCOCCUS AUREUS  MRSA PCR Screening     Status: Abnormal   Collection Time: 08/08/15  5:32 AM  Result Value Ref Range Status   MRSA by PCR POSITIVE (A) NEGATIVE Final    Comment:        The GeneXpert MRSA Assay (FDA approved for NASAL specimens only), is one component of a comprehensive MRSA colonization surveillance program. It is not intended to diagnose MRSA infection nor to guide or monitor treatment for MRSA infections. RESULT CALLED TO, READ BACK BY AND VERIFIED WITH: RN GORDON,R AT 1002 [id number] MARTINB   Anaerobic culture     Status: None (Preliminary result)   Collection Time: 08/08/15  8:28 AM  Result Value Ref Range Status   Specimen Description WOUND  RIGHT LEG  Final   Special Requests POF VANCOMYCIN LOWER RIGHT LEG INCISION PART A   Final   Gram Stain   Final    ABUNDANT WBC PRESENT, PREDOMINANTLY PMN NO SQUAMOUS EPITHELIAL CELLS SEEN FEW GRAM POSITIVE COCCI IN CLUSTERS IN PAIRS Performed at [facility]    Culture   Final    NO ANAEROBES ISOLATED; CULTURE IN PROGRESS FOR 5 DAYS Performed at [facility]    Report Status PENDING  Incomplete  Wound culture     Status: None   Collection Time: 08/08/15  8:28 AM  Result Value Ref Range Status   Specimen Description WOUND RIGHT LEG  Final   Special Requests POF VANCOMYCIN RIGHT LOWER LEG INCISION PART A  Final   Gram Stain   Final    FEW WBC PRESENT, PREDOMINANTLY PMN NO SQUAMOUS EPITHELIAL CELLS SEEN RARE GRAM POSITIVE COCCI IN PAIRS Performed at [facility]    Culture   Final    MODERATE METHICILLIN RESISTANT STAPHYLOCOCCUS AUREUS Note: RIFAMPIN AND GENTAMICIN SHOULD NOT BE USED AS SINGLE DRUGS FOR TREATMENT OF STAPH INFECTIONS. This organism DOES NOT demonstrate inducible Clindamycin resistance in vitro. CRITICAL RESULT CALLED TO, READ BACK BY AND VERIFIED WITH: CENLINA S 2/22 @  0920 BY REAMM Performed at [facility]    Report Status 08/11/2015 FINAL  Final   Organism ID, Bacteria METHICILLIN RESISTANT STAPHYLOCOCCUS AUREUS  Final      Susceptibility   Methicillin resistant staphylococcus aureus - MIC*    CLINDAMYCIN <=0.25 SENSITIVE Sensitive     ERYTHROMYCIN >=8 RESISTANT Resistant     GENTAMICIN <=0.5 SENSITIVE Sensitive     LEVOFLOXACIN >=8 RESISTANT Resistant     OXACILLIN >=4 RESISTANT Resistant     RIFAMPIN <=0.5 SENSITIVE Sensitive     TRIMETH/SULFA >=320 RESISTANT Resistant     VANCOMYCIN 1 SENSITIVE Sensitive     TETRACYCLINE <=1 SENSITIVE Sensitive     * MODERATE METHICILLIN RESISTANT STAPHYLOCOCCUS AUREUS  Anaerobic culture     Status: None (Preliminary result)  Collection Time: 08/08/15  8:49 AM  Result Value Ref Range  Status   Specimen Description WOUND RIGHT LEG  Final   Special Requests POF VANCOMYCIN PROXIMAL RIGHT LOWER LEG PART B  Final   Gram Stain   Final    ABUNDANT WBC PRESENT, PREDOMINANTLY PMN NO SQUAMOUS EPITHELIAL CELLS SEEN FEW GRAM POSITIVE COCCI IN PAIRS IN CLUSTERS Performed at [facility]    Culture   Final    NO ANAEROBES ISOLATED; CULTURE IN PROGRESS FOR 5 DAYS Performed at [facility]    Report Status PENDING  Incomplete  Wound culture     Status: None   Collection Time: 08/08/15  8:49 AM  Result Value Ref Range Status   Specimen Description WOUND RIGHT LEG  Final   Special Requests POF VANCOMYCIN PROXIMAL RIGHT LEG INCISON PART B  Final   Gram Stain   Final    FEW WBC PRESENT, PREDOMINANTLY PMN NO SQUAMOUS EPITHELIAL CELLS SEEN FEW GRAM POSITIVE COCCI IN PAIRS IN CLUSTERS Performed at [facility]    Culture   Final    MODERATE METHICILLIN RESISTANT STAPHYLOCOCCUS AUREUS Note: RIFAMPIN AND GENTAMICIN SHOULD NOT BE USED AS SINGLE DRUGS FOR TREATMENT OF STAPH INFECTIONS. This organism DOES NOT demonstrate inducible Clindamycin resistance in vitro. CRITICAL RESULT CALLED TO, READ BACK BY AND VERIFIED WITH: CENLINA S 2/22 @  0920 BY REAMM Performed at [facility]    Report Status 08/11/2015 FINAL  Final   Organism ID, Bacteria METHICILLIN RESISTANT STAPHYLOCOCCUS AUREUS  Final      Susceptibility   Methicillin resistant staphylococcus aureus - MIC*    CLINDAMYCIN <=0.25 SENSITIVE Sensitive     ERYTHROMYCIN >=8 RESISTANT Resistant     GENTAMICIN <=0.5 SENSITIVE Sensitive     LEVOFLOXACIN >=8 RESISTANT Resistant     OXACILLIN >=4 RESISTANT Resistant     RIFAMPIN <=0.5 SENSITIVE Sensitive     TRIMETH/SULFA >=320 RESISTANT Resistant     VANCOMYCIN 1 SENSITIVE Sensitive     TETRACYCLINE <=1 SENSITIVE Sensitive     * MODERATE METHICILLIN RESISTANT STAPHYLOCOCCUS AUREUS  Culture, blood (Routine X 2) w Reflex to ID Panel     Status: None    Collection Time: 08/08/15 12:40 PM  Result Value Ref Range Status   Specimen Description BLOOD RIGHT ANTECUBITAL  Final   Special Requests IN PEDIATRIC BOTTLE 2CC  Final   Culture  Setup Time   Final    GRAM POSITIVE COCCI IN CLUSTERS PEDIATRIC BOTTLE CRITICAL RESULT CALLED TO, READ BACK BY AND VERIFIED WITH: W DAVIS,RN AT 1043 08/09/15 BY L BENFIELD    Culture METHICILLIN RESISTANT STAPHYLOCOCCUS AUREUS  Final   Report Status 08/11/2015 FINAL  Final   Organism ID, Bacteria METHICILLIN RESISTANT STAPHYLOCOCCUS AUREUS  Final      Susceptibility   Methicillin resistant staphylococcus aureus - MIC*    CIPROFLOXACIN >=8 RESISTANT Resistant     ERYTHROMYCIN >=8 RESISTANT Resistant     GENTAMICIN <=0.5 SENSITIVE Sensitive     OXACILLIN >=4 RESISTANT Resistant     TETRACYCLINE <=1 SENSITIVE Sensitive     VANCOMYCIN 1 SENSITIVE Sensitive     TRIMETH/SULFA >=320 RESISTANT Resistant     CLINDAMYCIN <=0.25 SENSITIVE Sensitive     RIFAMPIN <=0.5 SENSITIVE Sensitive     Inducible Clindamycin NEGATIVE Sensitive     * METHICILLIN RESISTANT STAPHYLOCOCCUS AUREUS  Culture, blood (Routine X 2) w Reflex to ID Panel     Status: None (Preliminary result)   Collection Time:  08/08/15 12:45 PM  Result Value Ref Range Status   Specimen Description BLOOD RIGHT ARM  Final   Special Requests BOTTLES DRAWN AEROBIC ONLY  5CC  Final   Culture NO GROWTH 4 DAYS  Final   Report Status PENDING  Incomplete  Culture, blood (Routine X 2) w Reflex to ID Panel     Status: None (Preliminary result)   Collection Time: 08/09/15 12:09 PM  Result Value Ref Range Status   Specimen Description BLOOD BLOOD RIGHT FOREARM  Final   Special Requests IN PEDIATRIC BOTTLE .3CC  Final   Culture NO GROWTH 3 DAYS  Final   Report Status PENDING  Incomplete  Culture, blood (Routine X 2) w Reflex to ID Panel     Status: None (Preliminary result)   Collection Time: 08/09/15 12:15 PM  Result Value Ref Range Status   Specimen  Description BLOOD RIGHT ANTECUBITAL  Final   Special Requests BOTTLES DRAWN AEROBIC ONLY 6CC  Final   Culture NO GROWTH 3 DAYS  Final   Report Status PENDING  Incomplete     Studies:  Recent x-ray studies have been reviewed in detail by the Attending Physician  Scheduled Meds:  Scheduled Meds: . sodium chloride   Intravenous Once  . aspirin EC  325 mg Oral Daily  . Chlorhexidine Gluconate Cloth  6 each Topical Q0600  . docusate sodium  100 mg Oral BID  . feeding supplement (ENSURE ENLIVE)  237 mL Oral TID BM  . gabapentin  100 mg Oral TID  . insulin aspart  0-5 Units Subcutaneous QHS  . insulin aspart  0-9 Units Subcutaneous TID WC  . methocarbamol  750 mg Oral QID  . midodrine  10 mg Oral To PACU  . mupirocin ointment  1 application Nasal BID  . rifampin  300 mg Oral Daily  . tamsulosin  0.4 mg Oral Daily  . vancomycin  1,250 mg Intravenous Q8H    Time spent on care of this patient: 40 mins   WOODS, Roselind Messier , MD  [facility] Office  [phone] Pager [phone]  On-Call/Text Page:      [email or web address]      password [email or web address]  If 7PM-7AM, please contact night-coverage [email or web address] Password [facility] 08/12/2015, 3:40 PM   LOS: 5 days   Care during the described time interval was provided by me .  I have reviewed this patient's available data, including medical history, events of note, physical examination, and all test results as part of my evaluation. I have personally reviewed and interpreted all radiology studies.   Carolyne Littles, MD [phone] Pager

## 2015-08-12 NOTE — Progress Notes (Signed)
Dressings changed.  He has mild to moderate serosanguinous drainage from both incisions.  No frank pus.  No cellulitis.  Begin betadine paints to incision BID.  Lovenox stopped due to continued drainage.  Aspirin 325 daily for DVT ppx.  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  May need reapplication of wound vac if drainage does not cease.  Will come see patient later today.  Mayra Reel, MD [facility] [phone] 7:41 AM

## 2015-08-12 NOTE — Progress Notes (Signed)
Chief Complaint: Patient was seen in consultation today for aspiration of fluid collection at the request of Dr. Daiva Eves  Referring Physician(s): Dr. Daiva Eves  Supervising Physician: Dr. Oley Balm  History of Present Illness: Jeff Ferrell is a 65 y.o. male with post op complications of right hip fracture repair with wound infection and bacteremia. Also c/o back pain. ID seeing pt, had MRI T-spine and L-spine today. New finding of a posterior left chest wall fluid collection concerning for abscess. Given underlying situation, ID requests image guided aspiration/drainage of this fluid collection to rule out additional source of infection. PMHx, meds, labs, imaging reviewed with Dr. Deanne Coffer Pt was sedated for MRI earlier today but is alert and oriented during my evaluation  Past Medical History  Diagnosis Date  . Diabetes mellitus     regulated by diet  . Chronic back pain   . 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 Diabetic osteomyelitis (HCC) 03/29/2015  . Diabetic foot ulcer (HCC) 03/29/2015    Past Surgical History  Procedure Laterality Date  . Back surgery      screws & hardware to lower back  . 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 Femur im nail Left 06/18/2015    Procedure: INTRAMEDULLARY (IM) NAIL FEMORAL;  Surgeon: Tarry Kos, MD;  Location: [facility];  Service: Orthopedics;  Laterality: Left;  . Femur im nail Right 07/21/2015    Procedure: Intramedullary nail femoral;  Surgeon: Tarry Kos, MD;  Location: [facility];  Service: Orthopedics;  Laterality: Right;  . Incision and drainage hip Right 08/08/2015    Procedure: IRRIGATION AND DEBRIDEMENT RIGHT HIP WITH INCISIONAL WOUND VAC PLACEMENT;  Surgeon: Cammy Copa, MD;  Location: [facility];  Service: Orthopedics;  Laterality: Right;     Allergies: Penicillins  Medications:  Current facility-administered medications:  .  0.9 %  sodium chloride infusion, , Intravenous, Continuous, Lonia Blood, MD, Last Rate: 100 mL/hr at 08/12/15 1345 .  0.9 %  sodium chloride infusion, , Intravenous, Once, Leanne Chang, NP .  acetaminophen (TYLENOL) tablet 650 mg, 650 mg, Oral, Q6H PRN, 650 mg at 08/11/15 0938 **OR** acetaminophen (TYLENOL) suppository 650 mg, 650 mg, Rectal, Q6H PRN, Cammy Copa, MD .  alum & mag hydroxide-simeth (MAALOX/MYLANTA) 200-200-20 MG/5ML suspension 30 mL, 30 mL, Oral, Q6H PRN, Calvert Cantor, MD .  aspirin EC tablet 325 mg, 325 mg, Oral, Daily, Tarry Kos, MD, 325 mg at 08/12/15 1409 .  Chlorhexidine Gluconate Cloth 2 % PADS 6 each, 6 each, Topical, Q0600, Lonia Blood, MD, 6 each at 08/11/15 0600 .  diazepam (VALIUM) tablet 5 mg, 5 mg, Oral, Q6H PRN, Tarry Kos, MD, 5 mg at 08/12/15 1408 .  docusate sodium (COLACE) capsule 100 mg, 100 mg, Oral, BID, Lonia Blood, MD, 100 mg at 08/10/15 0913 .  feeding supplement (ENSURE ENLIVE) (ENSURE ENLIVE) liquid 237 mL, 237 mL, Oral, TID BM, Saima Rizwan, MD, 237 mL at 08/09/15 1400 .  flumazenil (ROMAZICON) injection 0.2 mg, 0.2 mg, Intravenous, PRN, Drema Dallas, MD .  gabapentin (NEURONTIN) capsule 100 mg, 100 mg, Oral, TID, Lonia Blood, MD, 100 mg at 08/11/15 2151 .  insulin aspart (novoLOG) injection 0-5 Units, 0-5 Units, Subcutaneous, QHS, Lonia Blood, MD, 0 Units at 08/08/15 2200 .  insulin aspart (novoLOG) injection 0-9 Units,  0-9 Units, Subcutaneous, TID WC, Lonia Blood, MD, 1 Units at 08/11/15 1730 .  lactated ringers infusion, , Intravenous, Continuous, Eilene Ghazi, MD, Last Rate: 50 mL/hr at 08/12/15 0849 .  methocarbamol (ROBAXIN) tablet 750 mg, 750 mg, Oral, QID, Drema Dallas, MD, 750 mg at 08/12/15 1408 .  metoCLOPramide (REGLAN) tablet 5-10 mg, 5-10 mg, Oral, Q8H PRN **OR** metoCLOPramide (REGLAN)  injection 5-10 mg, 5-10 mg, Intravenous, Q8H PRN, Cammy Copa, MD .  midodrine (PROAMATINE) tablet 10 mg, 10 mg, Oral, To PACU, Drema Dallas, MD .  morphine 2 MG/ML injection 2-4 mg, 2-4 mg, Intravenous, Q2H PRN, Lonia Blood, MD, 4 mg at 08/12/15 0606 .  mupirocin ointment (BACTROBAN) 2 % 1 application, 1 application, Nasal, BID, Lonia Blood, MD, 1 application at 08/11/15 2152 .  naloxone [facility]) injection 0.4 mg, 0.4 mg, Intravenous, PRN, Drema Dallas, MD .  ondansetron [facility]) tablet 4 mg, 4 mg, Oral, Q6H PRN **OR** ondansetron (ZOFRAN) injection 4 mg, 4 mg, Intravenous, Q6H PRN, Cammy Copa, MD .  oxyCODONE (Oxy IR/ROXICODONE) immediate release tablet 5-10 mg, 5-10 mg, Oral, Q3H PRN, Lonia Blood, MD, 10 mg at 08/12/15 1409 .  rifampin (RIFADIN) capsule 300 mg, 300 mg, Oral, Daily, Ginnie Smart, MD, 300 mg at 08/11/15 6962 .  tamsulosin (FLOMAX) capsule 0.4 mg, 0.4 mg, Oral, Daily, Lonia Blood, MD, 0.4 mg at 08/12/15 1409 .  vancomycin (VANCOCIN) 1,250 mg in sodium chloride 0.9 % 250 mL IVPB, 1,250 mg, Intravenous, Q8H, Crystal Jacqualin Combes, RPH, 1,250 mg at 08/12/15 0106    History reviewed. No pertinent family history.  Social History   Social History  . Marital Status: Single    Spouse Name: N/A  . Number of Children: N/A  . Years of Education: N/A   Social History Main Topics  . Smoking status: Never Smoker   . Smokeless tobacco: None  . Alcohol Use: 3.6 oz/week    6 Cans of beer per week     Comment: daily  . Drug Use: No  . Sexual Activity: Not Asked   Other Topics Concern  . None   Social History Narrative     Review of Systems: A 12 point ROS discussed and pertinent positives are indicated in the HPI above.  All other systems are negative.  Review of Systems  Vital Signs: BP 111/70 mmHg  Pulse 94  Temp(Src) 98.4 F (36.9 C) (Oral)  Resp 16  Ht 5\' 9"  (1.753 m)  Wt 161 lb 2.5 oz (73.1 kg)  BMI 23.79 kg/m2  SpO2  100%  Physical Exam  Constitutional: He is oriented to person, place, and time. He appears well-developed. No distress.  HENT:  Head: Normocephalic.  Mouth/Throat: Oropharynx is clear and moist.  Neck: Normal range of motion. No tracheal deviation present.  Cardiovascular: Normal rate, regular rhythm and normal heart sounds.   Pulmonary/Chest: Effort normal and breath sounds normal. No respiratory distress.  Neurological: He is alert and oriented to person, place, and time.  Skin: He is not diaphoretic.  Psychiatric: He has a normal mood and affect. Judgment normal.    Mallampati Score:  MD Evaluation Airway: WNL Heart: WNL Abdomen: WNL Chest/ Lungs: WNL ASA  Classification: 3 Mallampati/Airway Score: Two  Imaging: Mr Thoracic Spine W Wo Contrast  08/12/2015  ADDENDUM REPORT: 08/12/2015 13:50 ADDENDUM: Study discussed by telephone with Dr. Gwen Her Dam On 08/12/2015 at 1345 hours. Electronically Signed   By: Odessa Fleming  M.D.   On: 08/12/2015 13:50  08/12/2015  CLINICAL DATA:  65 year old male with recent hip surgery, became septic, and now with new onset back pain. Query discitis osteomyelitis. Indeterminate signal changes of the L5-S1 disc space since December. Initial encounter. Left rib fractures detected on portable chest x-ray in January. EXAM: MRI THORACIC AND LUMBAR SPINE WITHOUT AND WITH CONTRAST TECHNIQUE: Multiplanar and multiecho pulse sequences of the thoracic and lumbar spine were obtained without and with intravenous contrast. CONTRAST:  15mL MULTIHANCE GADOBENATE DIMEGLUMINE 529 MG/ML IV SOLN COMPARISON:  Hip intraoperative images 2117. Lumbar MRI 07/01/2015 and earlier. Thoracic and lumbar spine radiographs 11/24/2010. FINDINGS: MR THORACIC SPINE FINDINGS Limited sagittal imaging of the cervical spine is remarkable for diffuse disc degeneration with what appears to be at least moderate degenerative cervical spinal stenosis at C2-C3 and C3-C4 (series 2, image 6). Left greater  than right layering pleural effusions with confluent abnormal increased signal in both lower lobes. Multilevel (at least 5 levels) displaced left posterior rib fractures re- demonstrated (series 7, image 14), and there is an associated rim enhancing fluid collection in the intercostal muscles measuring about 40 mm in length and 10 mm in width (series 22, image 15 and series 7, image 15). This appears to be a just deep to the left inferior scapula. Other posterior paraspinal soft tissues are within normal limits. Preserved thoracic vertebral height and alignment. T2 and T11 vertebral body benign hemangiomas. No thoracic vertebral marrow edema or evidence of acute osseous abnormality. No inflammatory signal of the thoracic intervertebral discs. Degenerative multifactorial mild thoracic spinal stenosis from T3-T4 through T9-T10. Mild if any associated thoracic spinal cord mass effect. Spinal cord signal is within normal limits at all visualized levels. No dural thickening or enhancement. No abnormal intradural enhancement. The conus medullaris appears normal at T12-L1. MR LUMBAR SPINE FINDINGS Same numbering system as on the recent comparison lumbar MRIs. 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 Signal to noise in the lower lumbar levels is poor today, resulting in partial obscuration of the L3 through L5 vertebrae and thecal sac. The urinary bladder is severely distended. There is secondary hydronephrosis. There is edema in the right gluteus muscle immediately posterior to the right iliac wing (series 17, image 34) with an associated small 2 cm mildly heterogeneous rim enhancing fluid collection (series 20, image 34). Otherwise postoperative changes to the lumbar posterior paraspinal soft tissues appear stable. Allowing for chronic hardware susceptibility artifact and the suboptimal signal to noise in the lumbar spine today, there is no acute osseous abnormality or interval change in the  appearance of the lumbar disc spaces. There is chronic adjacent segment disease with mild to moderate multifactorial spinal stenosis at L1-L2. The conus medullaris appears normal at T12-L1. No abnormal intradural enhancement is evident. IMPRESSION: 1. Massively distended urinary bladder, causing secondary hydronephrosis. Recommend bladder decompression with Foley catheter. 2. No evidence of acute thoracic or lumbar spinal infection. Suboptimal MRI visualization of the lumbar spine today. 3. Rim enhancing fluid collections in the posterior left chest wall fluid - associated with multiple displaced rib fractures - and in the right gluteal muscle fluid collection are suspicious for phlegmon/abscess in this setting. 4. Left greater than right pleural effusions with bilateral lower lobe pneumonia. 5. Degenerative thoracic spinal stenosis from T3-T4 to T9-T10. Stable degenerative lumbar spinal stenosis at L1-L2. At least moderate degenerative cervical spinal stenosis at C2-C3 and C3-C4. Electronically Signed: By: Odessa Fleming M.D. On: 08/12/2015 13:32   Mr  Lumbar Spine W Wo Contrast  08/12/2015  ADDENDUM REPORT: 08/12/2015 13:50 ADDENDUM: Study discussed by telephone with Dr. Gwen Her Dam On 08/12/2015 at 1345 hours. Electronically Signed   By: Odessa Fleming M.D.   On: 08/12/2015 13:50  08/12/2015  CLINICAL DATA:  65 year old male with recent hip surgery, became septic, and now with new onset back pain. Query discitis osteomyelitis. Indeterminate signal changes of the L5-S1 disc space since December. Initial encounter. Left rib fractures detected on portable chest x-ray in January. EXAM: MRI THORACIC AND LUMBAR SPINE WITHOUT AND WITH CONTRAST TECHNIQUE: Multiplanar and multiecho pulse sequences of the thoracic and lumbar spine were obtained without and with intravenous contrast. CONTRAST:  15mL MULTIHANCE GADOBENATE DIMEGLUMINE 529 MG/ML IV SOLN COMPARISON:  Hip intraoperative images 2117. Lumbar MRI 07/01/2015 and earlier.  Thoracic and lumbar spine radiographs 11/24/2010. FINDINGS: MR THORACIC SPINE FINDINGS Limited sagittal imaging of the cervical spine is remarkable for diffuse disc degeneration with what appears to be at least moderate degenerative cervical spinal stenosis at C2-C3 and C3-C4 (series 2, image 6). Left greater than right layering pleural effusions with confluent abnormal increased signal in both lower lobes. Multilevel (at least 5 levels) displaced left posterior rib fractures re- demonstrated (series 7, image 14), and there is an associated rim enhancing fluid collection in the intercostal muscles measuring about 40 mm in length and 10 mm in width (series 22, image 15 and series 7, image 15). This appears to be a just deep to the left inferior scapula. Other posterior paraspinal soft tissues are within normal limits. Preserved thoracic vertebral height and alignment. T2 and T11 vertebral body benign hemangiomas. No thoracic vertebral marrow edema or evidence of acute osseous abnormality. No inflammatory signal of the thoracic intervertebral discs. Degenerative multifactorial mild thoracic spinal stenosis from T3-T4 through T9-T10. Mild if any associated thoracic spinal cord mass effect. Spinal cord signal is within normal limits at all visualized levels. No dural thickening or enhancement. No abnormal intradural enhancement. The conus medullaris appears normal at T12-L1. MR LUMBAR SPINE FINDINGS Same numbering system as on the recent comparison lumbar MRIs. 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 Signal to noise in the lower lumbar levels is poor today, resulting in partial obscuration of the L3 through L5 vertebrae and thecal sac. The urinary bladder is severely distended. There is secondary hydronephrosis. There is edema in the right gluteus muscle immediately posterior to the right iliac wing (series 17, image 34) with an associated small 2 cm mildly heterogeneous rim enhancing  fluid collection (series 20, image 34). Otherwise postoperative changes to the lumbar posterior paraspinal soft tissues appear stable. Allowing for chronic hardware susceptibility artifact and the suboptimal signal to noise in the lumbar spine today, there is no acute osseous abnormality or interval change in the appearance of the lumbar disc spaces. There is chronic adjacent segment disease with mild to moderate multifactorial spinal stenosis at L1-L2. The conus medullaris appears normal at T12-L1. No abnormal intradural enhancement is evident. IMPRESSION: 1. Massively distended urinary bladder, causing secondary hydronephrosis. Recommend bladder decompression with Foley catheter. 2. No evidence of acute thoracic or lumbar spinal infection. Suboptimal MRI visualization of the lumbar spine today. 3. Rim enhancing fluid collections in the posterior left chest wall fluid - associated with multiple displaced rib fractures - and in the right gluteal muscle fluid collection are suspicious for phlegmon/abscess in this setting. 4. Left greater than right pleural effusions with bilateral lower lobe pneumonia. 5. Degenerative thoracic spinal stenosis  from T3-T4 to T9-T10. Stable degenerative lumbar spinal stenosis at L1-L2. At least moderate degenerative cervical spinal stenosis at C2-C3 and C3-C4. Electronically Signed: By: Odessa Fleming M.D. On: 08/12/2015 13:32   Ct Hip Right Wo Contrast  08/07/2015  CLINICAL DATA:  Infection. Patient post right hip surgery for right femoral neck fracture 07/21/2015, now with drainage from wound and fever and right hip pain. EXAM: CT OF THE RIGHT HIP WITHOUT CONTRAST TECHNIQUE: Multidetector CT imaging of the right hip was performed according to the standard protocol. Multiplanar CT image reconstructions were also generated. COMPARISON:  Right hip CT 07/20/2015 FINDINGS: Femoral nail with lag screw fixation of comminuted right femoral neck fracture. No abnormal lucency about the included  hardware. Improved fracture alignment compared to preoperative radiograph. Questionable minimal interval fracture healing with some periosteal new bone about the lesser trochanter. There is skin thickening and diffuse subcutaneous edema laterally with skin staples in place. Small curvilinear fluid collection just superficial to the vastus lateralis musculature, with indistinct muscle planes laterally. This fluid collection measures up to 12 mm in depth. Heterogeneous enlargement of right gluteus medius musculature with some internal densities, likely sequela of IM nail. Heterogeneous fluid adjacent to the periosteal new bone about the lesser trochanter. Questionable tiny foci of air in the operative bed, axial image 52 and 39. Limited assessment for effusion secondary to streak artifact from surgical hardware. Enlarged right external iliac and inguinal lymph nodes. Diffuse urinary bladder wall thickening. Pelvic and sacral hardware is partially included. IMPRESSION: 1. Post recent fixation of right femoral neck fracture. Diffuse inflammatory change in the subcutaneous tissues most prominent laterally, with small crescentic fluid collection superficial to the vastus lateralis measuring up to 12 mm. Abscess versus hematoma, abscess favored in the setting of fever and infection. 2. Heterogeneous enlargement of the right gluteus medius musculature, may reflect postoperative hematoma versus infection. 3. Right inguinal and external iliac adenopathy, likely reactive. 4. Diffuse urinary bladder wall thickening, most consistent with urinary tract infection. Electronically Signed   By: Rubye Oaks M.D.   On: 08/07/2015 18:32   Ct Hip Right Wo Contrast  07/20/2015  CLINICAL DATA:  Right hip fracture. EXAM: CT OF THE RIGHT HIP WITHOUT CONTRAST TECHNIQUE: Multidetector CT imaging of the right hip was performed according to the standard protocol. Multiplanar CT image reconstructions were also generated. COMPARISON:   None. FINDINGS: Impacted, displaced and angulated right basicervical right femoral neck fracture. Comminuted fracture also involving the right greater trochanter. No extension of the fracture cleft to the lesser trochanter. The visualized right superior and inferior pubic rami are intact. There is no aggressive lytic or sclerotic osseous lesion. 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 The muscles are normal. There is no fluid collection or hematoma. There is peripheral vascular atherosclerotic disease. IMPRESSION: 1. Impacted, displaced and angulated right basicervical right femoral neck fracture. Comminuted fracture also involving the right greater trochanter. No extension of the fracture cleft to the lesser trochanter. Electronically Signed   By: Elige Ko   On: 07/20/2015 09:33   Dg Chest Port 1 View  08/07/2015  CLINICAL DATA:  Pt has an infected incision on his right hip. Hx of DM. No chest complaints. Pt is a nonsmoker EXAM: PORTABLE CHEST 1 VIEW COMPARISON:  07/02/2015 FINDINGS: Cardiac silhouette is normal in size and configuration. Normal mediastinal and hilar contours. Clear lungs.  No pleural effusion or pneumothorax. Bony thorax is intact. IMPRESSION: No active disease. Electronically Signed   By: Amie Portland  M.D.   On: 08/07/2015 11:24   Dg C-arm 1-60 Min  07/21/2015  CLINICAL DATA:  65 year old male status post intra medullary nail placement. EXAM: DG C-ARM 61-120 MIN; RIGHT FEMUR 2 VIEWS COMPARISON:  Right hip radiograph 07/18/2015. FINDINGS: Two intraoperative fluoroscopic spot views of the right hip document interval placement of a gamma nail fixation device traversing the previously noted hip fracture. Restoration of anatomic alignment has been achieved. Right femoral head is properly located. No acute complicating features. IMPRESSION: 1. Postoperative changes of ORIF in the right hip, as above. Electronically Signed   By: Trudie Reed M.D.   On: 07/21/2015 17:03   Dg Hip  Port Unilat With Pelvis 1v Right  08/07/2015  CLINICAL DATA:  Right hip fracture, status post operative fixation 2 weeks ago. Surgical incision infection with right hip pain. EXAM: DG HIP (WITH OR WITHOUT PELVIS) 1V PORT RIGHT COMPARISON:  07/18/2015 FINDINGS: Fixation hardware of the lumbar sacral spine and both hips evident. Recent right hip ORIF for an intertrochanteric fracture. Stable hardware and alignment. No definite hardware abnormality or acute osseous finding. Bony pelvis intact. Normal bowel gas pattern. IMPRESSION: No definite acute osseous or hardware abnormality by plain radiography. Electronically Signed   By: Judie Petit.  Shick M.D.   On: 08/07/2015 11:26   Dg Hip Unilat With Pelvis 2-3 Views Right  07/18/2015  CLINICAL DATA:  Right hip pain since Thursday.  Initial encounter. EXAM: DG HIP (WITH OR WITHOUT PELVIS) 2-3V RIGHT COMPARISON:  07/01/2015 FINDINGS: Acute basicervical right femoral neck fracture which also involves the greater trochanter. No intertrochanteric extension identified. There is varus angulation at the neck fracture from displacement. No dislocation. Recent proximal left femur fracture with progressive varus angulation especially when compared to operative fluoroscopy. Lumbosacral spine fixation with decompressive laminectomies. IMPRESSION: 1. Displaced basicervical right femoral neck fracture. Right greater trochanter fracture. 2. Recent left femoral neck fracture with progressive varus angulation. Electronically Signed   By: Marnee Spring M.D.   On: 07/18/2015 23:44   Dg Femur, Min 2 Views Right  07/21/2015  CLINICAL DATA:  65 year old male status post intra medullary nail placement. EXAM: DG C-ARM 61-120 MIN; RIGHT FEMUR 2 VIEWS COMPARISON:  Right hip radiograph 07/18/2015. FINDINGS: Two intraoperative fluoroscopic spot views of the right hip document interval placement of a gamma nail fixation device traversing the previously noted hip fracture. Restoration of anatomic  alignment has been achieved. Right femoral head is properly located. No acute complicating features. IMPRESSION: 1. Postoperative changes of ORIF in the right hip, as above. Electronically Signed   By: Trudie Reed M.D.   On: 07/21/2015 17:03    Labs:  CBC:  Recent Labs  08/10/15 0507 08/11/15 1200 08/11/15 2030 08/12/15 0540  WBC 8.3 7.1 7.0 7.3  HGB 7.8* 7.5* 7.9* 7.7*  HCT 23.9* 22.5* 23.5* 22.8*  PLT 315 303 364 346    COAGS:  Recent Labs  08/07/15 1053  INR 1.39    BMP:  Recent Labs  08/09/15 0434 08/10/15 0507 08/11/15 1200 08/12/15 0540  NA 127* 128* 126* 130*  K 3.5 3.2* 3.1* 3.6  CL 94* 94* 90* 96*  CO2 21* 24 25 26   GLUCOSE 90 90 197* 108*  BUN <5* <5* <5* <5*  CALCIUM 8.8* 8.8* 8.3* 8.3*  CREATININE 0.62 0.59* 0.53* 0.44*  GFRNONAA >60 >60 >60 >60  GFRAA >60 >60 >60 >60    LIVER FUNCTION TESTS:  Recent Labs  08/09/15 0434 08/10/15 0507 08/11/15 1200 08/12/15 0540  BILITOT 1.3*  1.6* 0.8 0.6  AST 24 20 26 21   ALT 15* 12* 12* 10*  ALKPHOS 163* 193* 191* 178*  PROT 6.1* 5.9* 5.5* 5.7*  ALBUMIN 2.6* 2.4* 2.1* 2.2*    TUMOR MARKERS: No results for input(s): AFPTM, CEA, CA199, CHROMGRNA in the last 8760 hours.  Assessment and Plan: MRSA bacteremia and infected right hip wound. New left posterior chest abscess/fluid collection of uncertain etiology. Hx of old fractures, After review of MRI, fluid collection appears approachable for aspiration/drainage. Subscapular but external to chest wall. Discussed procedure, risks, complications, use of sedation with pt. Will plan for CT guided drainage tomorrow. NPO p MN Continue to hold anticoagulation, ASA ok. Pt will sign consent in dept tomorrow as he has already been sedated today.  Thank you for this interesting consult. A copy of this report was sent to the requesting provider on this date.  Electronically Signed: Brayton El 08/12/2015, 3:06 PM   I spent a total of 20 minutes in  face to face in clinical consultation, greater than 50% of which was counseling/coordinating care for drainage of chest wall abscess

## 2015-08-12 NOTE — Anesthesia Preprocedure Evaluation (Signed)
Anesthesia Evaluation  Patient identified by MRN, date of birth, ID band Patient awake    Reviewed: Allergy & Precautions, NPO status , Patient's Chart, lab work & pertinent test results  Airway Mallampati: II  TM Distance: >3 FB Neck ROM: Full    Dental no notable dental hx.    Pulmonary neg pulmonary ROS,    Pulmonary exam normal breath sounds clear to auscultation       Cardiovascular + Peripheral Vascular Disease  Normal cardiovascular exam Rhythm:Regular Rate:Normal  Left ventricle: The cavity size was normal. Systolic function was normal. The estimated ejection fraction was in the range of 60% to 65%. Wall motion was normal; there were no regional wall motion abnormalities. - Aortic valve: Trileaflet; mildly thickened, mildly calcified leaflets. - Mitral valve: There was trivial regurgitation. - Tricuspid valve: There was trivial regurgitation.  Impressions:  - There was no obvious evidence of a vegetation.     Neuro/Psych negative neurological ROS  negative psych ROS   GI/Hepatic negative GI ROS, Neg liver ROS,   Endo/Other  diabetes  Renal/GU negative Renal ROS  negative genitourinary   Musculoskeletal negative musculoskeletal ROS (+)   Abdominal   Peds negative pediatric ROS (+)  Hematology  (+) anemia ,   Anesthesia Other Findings   Reproductive/Obstetrics negative OB ROS                             Anesthesia Physical Anesthesia Plan  ASA: III  Anesthesia Plan: General   Post-op Pain Management:    Induction: Intravenous  Airway Management Planned: Oral ETT and LMA  Additional Equipment:   Intra-op Plan:   Post-operative Plan: Extubation in OR  Informed Consent: I have reviewed the patients History and Physical, chart, labs and discussed the procedure including the risks, benefits and alternatives for the proposed anesthesia with the patient or authorized  representative who has indicated his/her understanding and acceptance.   Dental advisory given  Plan Discussed with: Surgeon and CRNA  Anesthesia Plan Comments:         Anesthesia Quick Evaluation

## 2015-08-12 NOTE — Progress Notes (Signed)
Pt HR maintaining in 100s.  Dressing reinforced.  Will continue to monitor.

## 2015-08-12 NOTE — Transfer of Care (Signed)
Immediate Anesthesia Transfer of Care Note  Patient: Jeff Ferrell  Procedure(s) Performed: Procedure(s): LUMBAR WITH WITHOUT CONTRAST THORACIC WITH WITHOUT CONTRAST (RADIOLOGY WITH ANESTHESIA) (N/A)  Patient Location: PACU  Anesthesia Type:General  Level of Consciousness: awake, alert  and oriented  Airway & Oxygen Therapy: Patient Spontanous Breathing and Patient connected to nasal cannula oxygen  Post-op Assessment: Report given to RN and Post -op Vital signs reviewed and stable  Post vital signs: Reviewed and stable  Last Vitals:  Filed Vitals:   08/12/15 0749 08/12/15 0759  BP: 106/73   Pulse: 97   Temp:  37.2 C  Resp: 17     Complications: No apparent anesthesia complications

## 2015-08-13 ENCOUNTER — Inpatient Hospital Stay (HOSPITAL_COMMUNITY): Payer: Medicare Other

## 2015-08-13 ENCOUNTER — Encounter (HOSPITAL_COMMUNITY)
Admission: EM | Disposition: A | Discharge status: Home Health | Payer: Self-pay | Source: Home / Self Care | Attending: Internal Medicine

## 2015-08-13 ENCOUNTER — Encounter (HOSPITAL_COMMUNITY): Payer: Self-pay | Admitting: Cardiovascular Disease

## 2015-08-13 DIAGNOSIS — L0231 Cutaneous abscess of buttock: Secondary | ICD-10-CM | POA: Diagnosis present

## 2015-08-13 DIAGNOSIS — S72001A Fracture of unspecified part of neck of right femur, initial encounter for closed fracture: Secondary | ICD-10-CM

## 2015-08-13 DIAGNOSIS — L0291 Cutaneous abscess, unspecified: Secondary | ICD-10-CM

## 2015-08-13 DIAGNOSIS — E871 Hypo-osmolality and hyponatremia: Secondary | ICD-10-CM

## 2015-08-13 DIAGNOSIS — S72001D Fracture of unspecified part of neck of right femur, subsequent encounter for closed fracture with routine healing: Secondary | ICD-10-CM

## 2015-08-13 DIAGNOSIS — L02213 Cutaneous abscess of chest wall: Secondary | ICD-10-CM

## 2015-08-13 DIAGNOSIS — N10 Acute tubulo-interstitial nephritis: Secondary | ICD-10-CM

## 2015-08-13 DIAGNOSIS — I371 Nonrheumatic pulmonary valve insufficiency: Secondary | ICD-10-CM

## 2015-08-13 DIAGNOSIS — R338 Other retention of urine: Secondary | ICD-10-CM

## 2015-08-13 DIAGNOSIS — R7881 Bacteremia: Secondary | ICD-10-CM

## 2015-08-13 DIAGNOSIS — J90 Pleural effusion, not elsewhere classified: Secondary | ICD-10-CM | POA: Diagnosis present

## 2015-08-13 HISTORY — PX: TEE WITHOUT CARDIOVERSION: SHX5443

## 2015-08-13 LAB — ANAEROBIC CULTURE

## 2015-08-13 LAB — CBC WITH DIFFERENTIAL/PLATELET
BASOS ABS: 0 10*3/uL (ref 0.0–0.1)
BASOS PCT: 0 %
EOS PCT: 3 %
Eosinophils Absolute: 0.2 10*3/uL (ref 0.0–0.7)
HEMATOCRIT: 23.7 % — AB (ref 39.0–52.0)
HEMOGLOBIN: 7.8 g/dL — AB (ref 13.0–17.0)
Lymphocytes Relative: 15 %
Lymphs Abs: 1 10*3/uL (ref 0.7–4.0)
MCH: 26.1 pg (ref 26.0–34.0)
MCHC: 32.9 g/dL (ref 30.0–36.0)
MCV: 79.3 fL (ref 78.0–100.0)
MONO ABS: 1 10*3/uL (ref 0.1–1.0)
Monocytes Relative: 14 %
NEUTROS ABS: 4.6 10*3/uL (ref 1.7–7.7)
Neutrophils Relative %: 68 %
Platelets: 388 10*3/uL (ref 150–400)
RBC: 2.99 MIL/uL — ABNORMAL LOW (ref 4.22–5.81)
RDW: 14.7 % (ref 11.5–15.5)
WBC: 6.8 10*3/uL (ref 4.0–10.5)

## 2015-08-13 LAB — COMPREHENSIVE METABOLIC PANEL
ALBUMIN: 2.2 g/dL — AB (ref 3.5–5.0)
ALK PHOS: 177 U/L — AB (ref 38–126)
ALT: 9 U/L — ABNORMAL LOW (ref 17–63)
ANION GAP: 9 (ref 5–15)
AST: 21 U/L (ref 15–41)
BILIRUBIN TOTAL: 0.5 mg/dL (ref 0.3–1.2)
BUN: <5 mg/dL — ABNORMAL LOW (ref 6–20)
CALCIUM: 8.4 mg/dL — AB (ref 8.9–10.3)
CO2: 26 mmol/L (ref 22–32)
CREATININE: 0.35 mg/dL — AB (ref 0.61–1.24)
Chloride: 95 mmol/L — ABNORMAL LOW (ref 101–111)
GFR calc Af Amer: >60 mL/min (ref >60)
GFR calc non Af Amer: >60 mL/min (ref >60)
GLUCOSE: 118 mg/dL — AB (ref 65–99)
Potassium: 3.6 mmol/L (ref 3.5–5.1)
Sodium: 130 mmol/L — ABNORMAL LOW (ref 135–145)
TOTAL PROTEIN: 5.5 g/dL — AB (ref 6.5–8.1)

## 2015-08-13 LAB — GLUCOSE, CAPILLARY
GLUCOSE-CAPILLARY: 104 mg/dL — AB (ref 65–99)
GLUCOSE-CAPILLARY: 130 mg/dL — AB (ref 65–99)
Glucose-Capillary: 125 mg/dL — ABNORMAL HIGH (ref 65–99)
Glucose-Capillary: 152 mg/dL — ABNORMAL HIGH (ref 65–99)

## 2015-08-13 LAB — CULTURE, BLOOD (ROUTINE X 2): Culture: NO GROWTH

## 2015-08-13 LAB — MAGNESIUM: MAGNESIUM: 1.4 mg/dL — AB (ref 1.7–2.4)

## 2015-08-13 SURGERY — ECHOCARDIOGRAM, TRANSESOPHAGEAL
Anesthesia: Moderate Sedation

## 2015-08-13 MED ORDER — FENTANYL CITRATE (PF) 100 MCG/2ML IJ SOLN
INTRAMUSCULAR | Status: AC
  Filled 2015-08-13: qty 2

## 2015-08-13 MED ORDER — MIDAZOLAM HCL 2 MG/2ML IJ SOLN
INTRAMUSCULAR | Status: AC | PRN
  Administered 2015-08-13 (×2): 1 mg via INTRAVENOUS

## 2015-08-13 MED ORDER — LIDOCAINE HCL 1 % IJ SOLN
INTRAMUSCULAR | Status: AC
Start: 2015-08-13 — End: 2015-08-13
  Filled 2015-08-13: qty 20

## 2015-08-13 MED ORDER — LIDOCAINE VISCOUS 2 % MT SOLN
OROMUCOSAL | Status: AC
Start: 2015-08-13 — End: 2015-08-13
  Filled 2015-08-13: qty 15

## 2015-08-13 MED ORDER — METHOCARBAMOL 500 MG PO TABS
1000.0000 mg | ORAL_TABLET | Freq: Four times a day (QID) | ORAL | Status: DC
  Administered 2015-08-13 – 2015-08-16 (×12): 1000 mg via ORAL
  Filled 2015-08-13 (×12): qty 2

## 2015-08-13 MED ORDER — MAGNESIUM SULFATE 50 % IJ SOLN
3.0000 g | Freq: Once | INTRAVENOUS | Status: AC
  Administered 2015-08-13: 3 g via INTRAVENOUS
  Filled 2015-08-13: qty 6

## 2015-08-13 MED ORDER — MIDAZOLAM HCL 2 MG/2ML IJ SOLN
INTRAMUSCULAR | Status: AC
  Filled 2015-08-13: qty 2

## 2015-08-13 MED ORDER — MIDAZOLAM HCL 5 MG/ML IJ SOLN
INTRAMUSCULAR | Status: AC
  Filled 2015-08-13: qty 2

## 2015-08-13 MED ORDER — FENTANYL CITRATE (PF) 100 MCG/2ML IJ SOLN
INTRAMUSCULAR | Status: AC | PRN
  Administered 2015-08-13 (×3): 50 ug via INTRAVENOUS

## 2015-08-13 MED ORDER — FENTANYL CITRATE (PF) 100 MCG/2ML IJ SOLN
INTRAMUSCULAR | Status: DC | PRN
  Administered 2015-08-13 (×2): 25 ug via INTRAVENOUS

## 2015-08-13 MED ORDER — MIDAZOLAM HCL 10 MG/2ML IJ SOLN
INTRAMUSCULAR | Status: DC | PRN
  Administered 2015-08-13 (×2): 2 mg via INTRAVENOUS

## 2015-08-13 MED ORDER — BUTAMBEN-TETRACAINE-BENZOCAINE 2-2-14 % EX AERO
INHALATION_SPRAY | CUTANEOUS | Status: DC | PRN
  Administered 2015-08-13: 2 via TOPICAL

## 2015-08-13 NOTE — Sedation Documentation (Signed)
Patient is resting comfortably. 

## 2015-08-13 NOTE — Procedures (Signed)
L scapula fluid aspiration 3 cc serosanguinous fluid No comp

## 2015-08-13 NOTE — Progress Notes (Signed)
PT Cancellation Note  Patient Details Name: Jeff Ferrell MRN: [id number] DOB: Sep 21, 1950   Cancelled Treatment:    Reason Eval/Treat Not Completed: Patient at procedure or test/unavailable.  Pt off floor for CT imaging.  PT will continue to follow acutely.  Michail Jewels PT, DPT [phone] Pager: [phone] 08/13/2015, 11:49 AM

## 2015-08-13 NOTE — Care Management Note (Signed)
Case Management Note  Patient Details  Name: Philip Kotlyar MRN: [id number] Date of Birth: 06/22/1950  Subjective/Objective:    Patient with MRSA Bacteremia, infected left hip with recent repair -has wound  vac, now with abscess on back and for ct guided aspiration by IR today. Patient will return to [facility] when medically ready.                 Action/Plan: MRSA bacteremia and infected right hip wound. Aspiration of  Abscess on back, for echo 2/24  Expected Discharge Date:                  Expected Discharge Plan:  Skilled Nursing Facility  In-House Referral:  Clinical Social Work  Discharge planning Services  CM Consult  Post Acute Care Choice:    Choice offered to:     DME Arranged:    DME Agency:     HH Arranged:    HH Agency:     Status of Service:  Completed, signed off  Medicare Important Message Given:  Yes Date Medicare IM Given:    Medicare IM give by:    Date Additional Medicare IM Given:    Additional Medicare Important Message give by:     If discussed at Long Length of Stay Meetings, dates discussed:    Additional Comments:  Leone Haven, RN 08/13/2015, 1:14 PM

## 2015-08-13 NOTE — CV Procedure (Signed)
Brief TEE Note  Mr. Rolston tolerated moderate sedation without complication.  He received  of versed and 50 mcg of fentanyl.  He was sedated for 20 minutes.  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  There were no complications.    For additional details see full report.  Brookley Spitler C. [name] Salvia, MD, [facility]  08/13/2015  9:41 AM

## 2015-08-13 NOTE — Progress Notes (Signed)
Subjective:  No new complaints  Antibiotics:  Anti-infectives    Start     Dose/Rate Route Frequency Ordered Stop   08/12/15 0200  vancomycin (VANCOCIN) 1,250 mg in sodium chloride 0.9 % 250 mL IVPB     1,250 mg 166.7 mL/hr over 90 Minutes Intravenous Every 8 hours 08/11/15 1820     08/10/15 1000  vancomycin (VANCOCIN) IVPB 1000 mg/200 mL premix  Status:  Discontinued     1,000 mg 200 mL/hr over 60 Minutes Intravenous Every 8 hours 08/10/15 0634 08/11/15 1820   08/09/15 1200  cefTRIAXone (ROCEPHIN) 2 g in dextrose 5 % 50 mL IVPB  Status:  Discontinued     2 g 100 mL/hr over 30 Minutes Intravenous Every 24 hours 08/09/15 1006 08/09/15 1140   08/08/15 1200  cefTRIAXone (ROCEPHIN) 1 g in dextrose 5 % 50 mL IVPB  Status:  Discontinued     1 g 100 mL/hr over 30 Minutes Intravenous Every 24 hours 08/08/15 1015 08/09/15 1006   08/08/15 1200  rifampin (RIFADIN) capsule 300 mg     300 mg Oral Daily 08/08/15 1015     08/08/15 0841  vancomycin (VANCOCIN) powder  Status:  Discontinued       As needed 08/08/15 0841 08/08/15 0938   08/08/15 0840  gentamicin (GARAMYCIN) injection  Status:  Discontinued       As needed 08/08/15 0841 08/08/15 0938   08/08/15 0500  vancomycin (VANCOCIN) IVPB 1000 mg/200 mL premix  Status:  Discontinued     1,000 mg 200 mL/hr over 60 Minutes Intravenous Every 12 hours 08/07/15 1552 08/10/15 0634   08/08/15 0100  vancomycin (VANCOCIN) IVPB 1000 mg/200 mL premix  Status:  Discontinued     1,000 mg 200 mL/hr over 60 Minutes Intravenous Every 12 hours 08/07/15 1219 08/07/15 1552   08/07/15 2000  [MAR Hold]  aztreonam (AZACTAM) 2 g in dextrose 5 % 50 mL IVPB  Status:  Discontinued     (MAR Hold since 08/08/15 0752)   2 g 100 mL/hr over 30 Minutes Intravenous 3 times per day 08/07/15 1219 08/08/15 1015   08/07/15 1600  vancomycin (VANCOCIN) 1,500 mg in sodium chloride 0.9 % 500 mL IVPB     1,500 mg 250 mL/hr over 120 Minutes Intravenous  Once 08/07/15 1551  08/07/15 1858   08/07/15 1130  vancomycin (VANCOCIN) 1,500 mg in sodium chloride 0.9 % 500 mL IVPB     1,500 mg 250 mL/hr over 120 Minutes Intravenous  Once 08/07/15 1054 08/07/15 1615   08/07/15 1100  aztreonam (AZACTAM) 2 g in dextrose 5 % 50 mL IVPB     2 g 100 mL/hr over 30 Minutes Intravenous  Once 08/07/15 1054 08/07/15 1248      Medications: Scheduled Meds: . sodium chloride   Intravenous Once  . aspirin EC  325 mg Oral Daily  . Chlorhexidine Gluconate Cloth  6 each Topical Q0600  . docusate sodium  100 mg Oral BID  . feeding supplement (ENSURE ENLIVE)  237 mL Oral TID BM  . fentaNYL      . fentaNYL      . gabapentin  100 mg Oral TID  . insulin aspart  0-5 Units Subcutaneous QHS  . insulin aspart  0-9 Units Subcutaneous TID WC  . lidocaine      . magnesium sulfate 1 - 4 g bolus IVPB  3 g Intravenous Once  . methocarbamol  1,000 mg Oral QID  .  midazolam      . rifampin  300 mg Oral Daily  . tamsulosin  0.4 mg Oral Daily  . vancomycin  1,250 mg Intravenous Q8H   Continuous Infusions: . sodium chloride 100 mL/hr at 08/13/15 0552   PRN Meds:.acetaminophen **OR** acetaminophen, alum & mag hydroxide-simeth, diazepam, flumazenil, metoCLOPramide **OR** metoCLOPramide (REGLAN) injection, morphine injection, naLOXone (NARCAN)  injection, ondansetron **OR** ondansetron (ZOFRAN) IV, oxyCODONE    Objective: Weight change:   Intake/Output Summary (Last 24 hours) at 08/13/15 1906 Last data filed at 08/13/15 1600  Gross per 24 hour  Intake 2971.67 ml  Output   2950 ml  Net  21.67 ml   Blood pressure 147/64, pulse 100, temperature 98.4 F (36.9 C), temperature source Oral, resp. rate 17, height 5\' 9"  (1.753 m), weight 161 lb 2.5 oz (73.1 kg), SpO2 100 %. Temp:  [97.6 F (36.4 C)-99 F (37.2 C)] 98.4 F (36.9 C) (02/24 1459) Pulse Rate:  [80-107] 100 (02/24 1459) Resp:  [12-100] 17 (02/24 1459) BP: (94-147)/(38-82) 147/64 mmHg (02/24 1459) SpO2:  [96 %-100 %] 100 % (02/24  1459)  Physical Exam: General: Alert and awake, oriented person and place he does not recall IR procedure yesterday. Lying on side due to pain in hip and back  Neuro: nonfocal  CBC:  CBC Latest Ref Rng 08/13/2015 08/12/2015 08/11/2015  WBC 4.0 - 10.5 K/uL 6.8 7.3 7.0  Hemoglobin 13.0 - 17.0 g/dL 7.8(L) 7.7(L) 7.9(L)  Hematocrit 39.0 - 52.0 % 23.7(L) 22.8(L) 23.5(L)  Platelets 150 - 400 K/uL 388 346 364       BMET  Recent Labs  08/12/15 0540 08/13/15 0518  NA 130* 130*  K 3.6 3.6  CL 96* 95*  CO2 26 26  GLUCOSE 108* 118*  BUN <5* <5*  CREATININE 0.44* 0.35*  CALCIUM 8.3* 8.4*     Liver Panel   Recent Labs  08/12/15 0540 08/13/15 0518  PROT 5.7* 5.5*  ALBUMIN 2.2* 2.2*  AST 21 21  ALT 10* 9*  ALKPHOS 178* 177*  BILITOT 0.6 0.5       Sedimentation Rate No results for input(s): ESRSEDRATE in the last 72 hours. C-Reactive Protein No results for input(s): CRP in the last 72 hours.  Micro Results: Recent Results (from the past 720 hour(s))  Surgical pcr screen     Status: Abnormal   Collection Time: 07/19/15  8:55 AM  Result Value Ref Range Status   MRSA, PCR POSITIVE (A) NEGATIVE Final    Comment: RESULT CALLED TO, READ BACK BY AND VERIFIED WITH: AMY FARGO,RN [id number] @ 1417 BY J SCOTTON    Staphylococcus aureus POSITIVE (A) NEGATIVE Final    Comment:        The Xpert SA Assay (FDA approved for NASAL specimens in patients over 35 years of age), is one component of a comprehensive surveillance program.  Test performance has been validated by [facility] for patients greater than or equal to 48 year old. It is not intended to diagnose infection nor to guide or monitor treatment. RESULT CALLED TO, READ BACK BY AND VERIFIED WITH: AMY FARGO,RN [id number] @ 1417 BY J SCOTTON   Blood Culture (routine x 2)     Status: None   Collection Time: 08/07/15 10:47 AM  Result Value Ref Range Status   Specimen Description LEFT ANTECUBITAL  Final   Special  Requests BOTTLES DRAWN AEROBIC AND ANAEROBIC 5CC  Final   Culture  Setup Time   Final    GRAM POSITIVE COCCI  IN CLUSTERS IN BOTH AEROBIC AND ANAEROBIC BOTTLES CRITICAL RESULT CALLED TO, READ BACK BY AND VERIFIED WITH: TKayren Eaves RN [phone] GREEN R CONFIRMED BY M. CAMPBELL    Culture METHICILLIN RESISTANT STAPHYLOCOCCUS AUREUS  Final   Report Status 08/10/2015 FINAL  Final   Organism ID, Bacteria METHICILLIN RESISTANT STAPHYLOCOCCUS AUREUS  Final      Susceptibility   Methicillin resistant staphylococcus aureus - MIC*    CIPROFLOXACIN >=8 RESISTANT Resistant     ERYTHROMYCIN >=8 RESISTANT Resistant     GENTAMICIN <=0.5 SENSITIVE Sensitive     OXACILLIN >=4 RESISTANT Resistant     TETRACYCLINE <=1 SENSITIVE Sensitive     VANCOMYCIN <=0.5 SENSITIVE Sensitive     TRIMETH/SULFA >=320 RESISTANT Resistant     CLINDAMYCIN <=0.25 SENSITIVE Sensitive     RIFAMPIN <=0.5 SENSITIVE Sensitive     Inducible Clindamycin NEGATIVE Sensitive     * METHICILLIN RESISTANT STAPHYLOCOCCUS AUREUS  Wound culture     Status: None   Collection Time: 08/07/15 10:53 AM  Result Value Ref Range Status   Specimen Description WOUND RIGHT HIP  Final   Special Requests SKIN DEEP EXCISION   Final   Gram Stain   Final    NO WBC SEEN NO SQUAMOUS EPITHELIAL CELLS SEEN NO ORGANISMS SEEN Performed at [facility]    Culture   Final    ABUNDANT METHICILLIN RESISTANT STAPHYLOCOCCUS AUREUS Note: RIFAMPIN AND GENTAMICIN SHOULD NOT BE USED AS SINGLE DRUGS FOR TREATMENT OF STAPH INFECTIONS. This organism DOES NOT demonstrate inducible Clindamycin resistance in vitro. CRITICAL RESULT CALLED TO, READ BACK BY AND VERIFIED WITH: WHITNEY DAVIS @  9:10 AM 08/10/15 BY DWEEKS Performed at [facility]    Report Status 08/10/2015 FINAL  Final   Organism ID, Bacteria METHICILLIN RESISTANT STAPHYLOCOCCUS AUREUS  Final      Susceptibility   Methicillin resistant staphylococcus aureus - MIC*    CLINDAMYCIN <=0.25  SENSITIVE Sensitive     ERYTHROMYCIN >=8 RESISTANT Resistant     GENTAMICIN <=0.5 SENSITIVE Sensitive     LEVOFLOXACIN >=8 RESISTANT Resistant     OXACILLIN >=4 RESISTANT Resistant     RIFAMPIN <=0.5 SENSITIVE Sensitive     TRIMETH/SULFA >=320 RESISTANT Resistant     VANCOMYCIN <=0.5 SENSITIVE Sensitive     TETRACYCLINE <=1 SENSITIVE Sensitive     * ABUNDANT METHICILLIN RESISTANT STAPHYLOCOCCUS AUREUS  Blood Culture (routine x 2)     Status: None   Collection Time: 08/07/15 11:18 AM  Result Value Ref Range Status   Specimen Description BLOOD LEFT HAND  Final   Special Requests BOTTLES DRAWN AEROBIC ONLY 10CC  Final   Culture  Setup Time   Final    GRAM POSITIVE COCCI IN CLUSTERS AEROBIC BOTTLE ONLY CRITICAL RESULT CALLED TO, READ BACK BY AND VERIFIED WITH: T. IRBY RN [phone] [id number] GREEN R CONFIRMED BY M. CAMPBELL    Culture   Final    STAPHYLOCOCCUS AUREUS SUSCEPTIBILITIES PERFORMED ON PREVIOUS CULTURE WITHIN THE LAST 5 DAYS.    Report Status 08/10/2015 FINAL  Final  Urine culture     Status: None   Collection Time: 08/07/15 12:23 PM  Result Value Ref Range Status   Specimen Description URINE, CATHETERIZED  Final   Special Requests NONE  Final   Culture   Final    >=100,000 COLONIES/mL METHICILLIN RESISTANT STAPHYLOCOCCUS AUREUS   Report Status 08/09/2015 FINAL  Final   Organism ID, Bacteria METHICILLIN RESISTANT STAPHYLOCOCCUS AUREUS  Final  Susceptibility   Methicillin resistant staphylococcus aureus - MIC*    CIPROFLOXACIN >=8 RESISTANT Resistant     GENTAMICIN <=0.5 SENSITIVE Sensitive     NITROFURANTOIN <=16 SENSITIVE Sensitive     OXACILLIN >=4 RESISTANT Resistant     TETRACYCLINE <=1 SENSITIVE Sensitive     VANCOMYCIN 1 SENSITIVE Sensitive     TRIMETH/SULFA >=320 RESISTANT Resistant     CLINDAMYCIN <=0.25 SENSITIVE Sensitive     RIFAMPIN <=0.5 SENSITIVE Sensitive     Inducible Clindamycin NEGATIVE Sensitive     * >=100,000 COLONIES/mL METHICILLIN RESISTANT  STAPHYLOCOCCUS AUREUS  MRSA PCR Screening     Status: Abnormal   Collection Time: 08/08/15  5:32 AM  Result Value Ref Range Status   MRSA by PCR POSITIVE (A) NEGATIVE Final    Comment:        The GeneXpert MRSA Assay (FDA approved for NASAL specimens only), is one component of a comprehensive MRSA colonization surveillance program. It is not intended to diagnose MRSA infection nor to guide or monitor treatment for MRSA infections. RESULT CALLED TO, READ BACK BY AND VERIFIED WITH: RN GORDON,R AT 1002 [id number] MARTINB   Anaerobic culture     Status: None   Collection Time: 08/08/15  8:28 AM  Result Value Ref Range Status   Specimen Description WOUND RIGHT LEG  Final   Special Requests POF VANCOMYCIN LOWER RIGHT LEG INCISION PART A   Final   Gram Stain   Final    ABUNDANT WBC PRESENT, PREDOMINANTLY PMN NO SQUAMOUS EPITHELIAL CELLS SEEN FEW GRAM POSITIVE COCCI IN CLUSTERS IN PAIRS Performed at [facility]    Culture   Final    NO ANAEROBES ISOLATED Performed at [facility]    Report Status 08/13/2015 FINAL  Final  Wound culture     Status: None   Collection Time: 08/08/15  8:28 AM  Result Value Ref Range Status   Specimen Description WOUND RIGHT LEG  Final   Special Requests POF VANCOMYCIN RIGHT LOWER LEG INCISION PART A  Final   Gram Stain   Final    FEW WBC PRESENT, PREDOMINANTLY PMN NO SQUAMOUS EPITHELIAL CELLS SEEN RARE GRAM POSITIVE COCCI IN PAIRS Performed at [facility]    Culture   Final    MODERATE METHICILLIN RESISTANT STAPHYLOCOCCUS AUREUS Note: RIFAMPIN AND GENTAMICIN SHOULD NOT BE USED AS SINGLE DRUGS FOR TREATMENT OF STAPH INFECTIONS. This organism DOES NOT demonstrate inducible Clindamycin resistance in vitro. CRITICAL RESULT CALLED TO, READ BACK BY AND VERIFIED WITH: CENLINA S 2/22 @  0920 BY REAMM Performed at [facility]    Report Status 08/11/2015 FINAL  Final   Organism ID, Bacteria METHICILLIN RESISTANT  STAPHYLOCOCCUS AUREUS  Final      Susceptibility   Methicillin resistant staphylococcus aureus - MIC*    CLINDAMYCIN <=0.25 SENSITIVE Sensitive     ERYTHROMYCIN >=8 RESISTANT Resistant     GENTAMICIN <=0.5 SENSITIVE Sensitive     LEVOFLOXACIN >=8 RESISTANT Resistant     OXACILLIN >=4 RESISTANT Resistant     RIFAMPIN <=0.5 SENSITIVE Sensitive     TRIMETH/SULFA >=320 RESISTANT Resistant     VANCOMYCIN 1 SENSITIVE Sensitive     TETRACYCLINE <=1 SENSITIVE Sensitive     * MODERATE METHICILLIN RESISTANT STAPHYLOCOCCUS AUREUS  Anaerobic culture     Status: None   Collection Time: 08/08/15  8:49 AM  Result Value Ref Range Status   Specimen Description WOUND RIGHT LEG  Final   Special Requests POF VANCOMYCIN PROXIMAL  RIGHT LOWER LEG PART B  Final   Gram Stain   Final    ABUNDANT WBC PRESENT, PREDOMINANTLY PMN NO SQUAMOUS EPITHELIAL CELLS SEEN FEW GRAM POSITIVE COCCI IN PAIRS IN CLUSTERS Performed at [facility]    Culture   Final    NO ANAEROBES ISOLATED Performed at [facility]    Report Status 08/13/2015 FINAL  Final  Wound culture     Status: None   Collection Time: 08/08/15  8:49 AM  Result Value Ref Range Status   Specimen Description WOUND RIGHT LEG  Final   Special Requests POF VANCOMYCIN PROXIMAL RIGHT LEG INCISON PART B  Final   Gram Stain   Final    FEW WBC PRESENT, PREDOMINANTLY PMN NO SQUAMOUS EPITHELIAL CELLS SEEN FEW GRAM POSITIVE COCCI IN PAIRS IN CLUSTERS Performed at [facility]    Culture   Final    MODERATE METHICILLIN RESISTANT STAPHYLOCOCCUS AUREUS Note: RIFAMPIN AND GENTAMICIN SHOULD NOT BE USED AS SINGLE DRUGS FOR TREATMENT OF STAPH INFECTIONS. This organism DOES NOT demonstrate inducible Clindamycin resistance in vitro. CRITICAL RESULT CALLED TO, READ BACK BY AND VERIFIED WITH: CENLINA S 2/22 @  0920 BY REAMM Performed at [facility]    Report Status 08/11/2015 FINAL  Final   Organism ID, Bacteria METHICILLIN  RESISTANT STAPHYLOCOCCUS AUREUS  Final      Susceptibility   Methicillin resistant staphylococcus aureus - MIC*    CLINDAMYCIN <=0.25 SENSITIVE Sensitive     ERYTHROMYCIN >=8 RESISTANT Resistant     GENTAMICIN <=0.5 SENSITIVE Sensitive     LEVOFLOXACIN >=8 RESISTANT Resistant     OXACILLIN >=4 RESISTANT Resistant     RIFAMPIN <=0.5 SENSITIVE Sensitive     TRIMETH/SULFA >=320 RESISTANT Resistant     VANCOMYCIN 1 SENSITIVE Sensitive     TETRACYCLINE <=1 SENSITIVE Sensitive     * MODERATE METHICILLIN RESISTANT STAPHYLOCOCCUS AUREUS  Culture, blood (Routine X 2) w Reflex to ID Panel     Status: None   Collection Time: 08/08/15 12:40 PM  Result Value Ref Range Status   Specimen Description BLOOD RIGHT ANTECUBITAL  Final   Special Requests IN PEDIATRIC BOTTLE 2CC  Final   Culture  Setup Time   Final    GRAM POSITIVE COCCI IN CLUSTERS PEDIATRIC BOTTLE CRITICAL RESULT CALLED TO, READ BACK BY AND VERIFIED WITH: W DAVIS,RN AT 1043 08/09/15 BY L BENFIELD    Culture METHICILLIN RESISTANT STAPHYLOCOCCUS AUREUS  Final   Report Status 08/11/2015 FINAL  Final   Organism ID, Bacteria METHICILLIN RESISTANT STAPHYLOCOCCUS AUREUS  Final      Susceptibility   Methicillin resistant staphylococcus aureus - MIC*    CIPROFLOXACIN >=8 RESISTANT Resistant     ERYTHROMYCIN >=8 RESISTANT Resistant     GENTAMICIN <=0.5 SENSITIVE Sensitive     OXACILLIN >=4 RESISTANT Resistant     TETRACYCLINE <=1 SENSITIVE Sensitive     VANCOMYCIN 1 SENSITIVE Sensitive     TRIMETH/SULFA >=320 RESISTANT Resistant     CLINDAMYCIN <=0.25 SENSITIVE Sensitive     RIFAMPIN <=0.5 SENSITIVE Sensitive     Inducible Clindamycin NEGATIVE Sensitive     * METHICILLIN RESISTANT STAPHYLOCOCCUS AUREUS  Culture, blood (Routine X 2) w Reflex to ID Panel     Status: None   Collection Time: 08/08/15 12:45 PM  Result Value Ref Range Status   Specimen Description BLOOD RIGHT ARM  Final   Special Requests BOTTLES DRAWN AEROBIC ONLY  5CC  Final    Culture NO GROWTH  5 DAYS  Final   Report Status 08/13/2015 FINAL  Final  Culture, blood (Routine X 2) w Reflex to ID Panel     Status: None (Preliminary result)   Collection Time: 08/09/15 12:09 PM  Result Value Ref Range Status   Specimen Description BLOOD BLOOD RIGHT FOREARM  Final   Special Requests IN PEDIATRIC BOTTLE .3CC  Final   Culture NO GROWTH 4 DAYS  Final   Report Status PENDING  Incomplete  Culture, blood (Routine X 2) w Reflex to ID Panel     Status: None (Preliminary result)   Collection Time: 08/09/15 12:15 PM  Result Value Ref Range Status   Specimen Description BLOOD RIGHT ANTECUBITAL  Final   Special Requests BOTTLES DRAWN AEROBIC ONLY 6CC  Final   Culture NO GROWTH 4 DAYS  Final   Report Status PENDING  Incomplete    Studies/Results: Mr Thoracic Spine W Wo Contrast  08/12/2015  ADDENDUM REPORT: 08/12/2015 13:50 ADDENDUM: Study discussed by telephone with Dr. Gwen Her Dam On 08/12/2015 at 1345 hours. Electronically Signed   By: Odessa Fleming M.D.   On: 08/12/2015 13:50  08/12/2015  CLINICAL DATA:  65 year old male with recent hip surgery, became septic, and now with new onset back pain. Query discitis osteomyelitis. Indeterminate signal changes of the L5-S1 disc space since December. Initial encounter. Left rib fractures detected on portable chest x-ray in January. EXAM: MRI THORACIC AND LUMBAR SPINE WITHOUT AND WITH CONTRAST TECHNIQUE: Multiplanar and multiecho pulse sequences of the thoracic and lumbar spine were obtained without and with intravenous contrast. CONTRAST:  15mL MULTIHANCE GADOBENATE DIMEGLUMINE 529 MG/ML IV SOLN COMPARISON:  Hip intraoperative images 2117. Lumbar MRI 07/01/2015 and earlier. Thoracic and lumbar spine radiographs 11/24/2010. FINDINGS: MR THORACIC SPINE FINDINGS Limited sagittal imaging of the cervical spine is remarkable for diffuse disc degeneration with what appears to be at least moderate degenerative cervical spinal stenosis at C2-C3 and C3-C4  (series 2, image 6). Left greater than right layering pleural effusions with confluent abnormal increased signal in both lower lobes. Multilevel (at least 5 levels) displaced left posterior rib fractures re- demonstrated (series 7, image 14), and there is an associated rim enhancing fluid collection in the intercostal muscles measuring about 40 mm in length and 10 mm in width (series 22, image 15 and series 7, image 15). This appears to be a just deep to the left inferior scapula. Other posterior paraspinal soft tissues are within normal limits. Preserved thoracic vertebral height and alignment. T2 and T11 vertebral body benign hemangiomas. No thoracic vertebral marrow edema or evidence of acute osseous abnormality. No inflammatory signal of the thoracic intervertebral discs. Degenerative multifactorial mild thoracic spinal stenosis from T3-T4 through T9-T10. Mild if any associated thoracic spinal cord mass effect. Spinal cord signal is within normal limits at all visualized levels. No dural thickening or enhancement. No abnormal intradural enhancement. The conus medullaris appears normal at T12-L1. MR LUMBAR SPINE FINDINGS Same numbering system as on the recent comparison lumbar MRIs. 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 Signal to noise in the lower lumbar levels is poor today, resulting in partial obscuration of the L3 through L5 vertebrae and thecal sac. The urinary bladder is severely distended. There is secondary hydronephrosis. There is edema in the right gluteus muscle immediately posterior to the right iliac wing (series 17, image 34) with an associated small 2 cm mildly heterogeneous rim enhancing fluid collection (series 20, image 34). Otherwise postoperative changes to the lumbar posterior  paraspinal soft tissues appear stable. Allowing for chronic hardware susceptibility artifact and the suboptimal signal to noise in the lumbar spine today, there is no acute osseous  abnormality or interval change in the appearance of the lumbar disc spaces. There is chronic adjacent segment disease with mild to moderate multifactorial spinal stenosis at L1-L2. The conus medullaris appears normal at T12-L1. No abnormal intradural enhancement is evident. IMPRESSION: 1. Massively distended urinary bladder, causing secondary hydronephrosis. Recommend bladder decompression with Foley catheter. 2. No evidence of acute thoracic or lumbar spinal infection. Suboptimal MRI visualization of the lumbar spine today. 3. Rim enhancing fluid collections in the posterior left chest wall fluid - associated with multiple displaced rib fractures - and in the right gluteal muscle fluid collection are suspicious for phlegmon/abscess in this setting. 4. Left greater than right pleural effusions with bilateral lower lobe pneumonia. 5. Degenerative thoracic spinal stenosis from T3-T4 to T9-T10. Stable degenerative lumbar spinal stenosis at L1-L2. At least moderate degenerative cervical spinal stenosis at C2-C3 and C3-C4. Electronically Signed: By: Odessa Fleming M.D. On: 08/12/2015 13:32   Mr Lumbar Spine W Wo Contrast  08/12/2015  ADDENDUM REPORT: 08/12/2015 13:50 ADDENDUM: Study discussed by telephone with Dr. Gwen Her Dam On 08/12/2015 at 1345 hours. Electronically Signed   By: Odessa Fleming M.D.   On: 08/12/2015 13:50  08/12/2015  CLINICAL DATA:  65 year old male with recent hip surgery, became septic, and now with new onset back pain. Query discitis osteomyelitis. Indeterminate signal changes of the L5-S1 disc space since December. Initial encounter. Left rib fractures detected on portable chest x-ray in January. EXAM: MRI THORACIC AND LUMBAR SPINE WITHOUT AND WITH CONTRAST TECHNIQUE: Multiplanar and multiecho pulse sequences of the thoracic and lumbar spine were obtained without and with intravenous contrast. CONTRAST:  15mL MULTIHANCE GADOBENATE DIMEGLUMINE 529 MG/ML IV SOLN COMPARISON:  Hip intraoperative images 2117.  Lumbar MRI 07/01/2015 and earlier. Thoracic and lumbar spine radiographs 11/24/2010. FINDINGS: MR THORACIC SPINE FINDINGS Limited sagittal imaging of the cervical spine is remarkable for diffuse disc degeneration with what appears to be at least moderate degenerative cervical spinal stenosis at C2-C3 and C3-C4 (series 2, image 6). Left greater than right layering pleural effusions with confluent abnormal increased signal in both lower lobes. Multilevel (at least 5 levels) displaced left posterior rib fractures re- demonstrated (series 7, image 14), and there is an associated rim enhancing fluid collection in the intercostal muscles measuring about 40 mm in length and 10 mm in width (series 22, image 15 and series 7, image 15). This appears to be a just deep to the left inferior scapula. Other posterior paraspinal soft tissues are within normal limits. Preserved thoracic vertebral height and alignment. T2 and T11 vertebral body benign hemangiomas. No thoracic vertebral marrow edema or evidence of acute osseous abnormality. No inflammatory signal of the thoracic intervertebral discs. Degenerative multifactorial mild thoracic spinal stenosis from T3-T4 through T9-T10. Mild if any associated thoracic spinal cord mass effect. Spinal cord signal is within normal limits at all visualized levels. No dural thickening or enhancement. No abnormal intradural enhancement. The conus medullaris appears normal at T12-L1. MR LUMBAR SPINE FINDINGS Same numbering system as on the recent comparison lumbar MRIs. 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 Signal to noise in the lower lumbar levels is poor today, resulting in partial obscuration of the L3 through L5 vertebrae and thecal sac. The urinary bladder is severely distended. There is secondary hydronephrosis. There is edema in the right gluteus  muscle immediately posterior to the right iliac wing (series 17, image 34) with an associated small 2 cm  mildly heterogeneous rim enhancing fluid collection (series 20, image 34). Otherwise postoperative changes to the lumbar posterior paraspinal soft tissues appear stable. Allowing for chronic hardware susceptibility artifact and the suboptimal signal to noise in the lumbar spine today, there is no acute osseous abnormality or interval change in the appearance of the lumbar disc spaces. There is chronic adjacent segment disease with mild to moderate multifactorial spinal stenosis at L1-L2. The conus medullaris appears normal at T12-L1. No abnormal intradural enhancement is evident. IMPRESSION: 1. Massively distended urinary bladder, causing secondary hydronephrosis. Recommend bladder decompression with Foley catheter. 2. No evidence of acute thoracic or lumbar spinal infection. Suboptimal MRI visualization of the lumbar spine today. 3. Rim enhancing fluid collections in the posterior left chest wall fluid - associated with multiple displaced rib fractures - and in the right gluteal muscle fluid collection are suspicious for phlegmon/abscess in this setting. 4. Left greater than right pleural effusions with bilateral lower lobe pneumonia. 5. Degenerative thoracic spinal stenosis from T3-T4 to T9-T10. Stable degenerative lumbar spinal stenosis at L1-L2. At least moderate degenerative cervical spinal stenosis at C2-C3 and C3-C4. Electronically Signed: By: Odessa Fleming M.D. On: 08/12/2015 13:32   Ct Aspiration  08/13/2015  CLINICAL DATA:  Posterior left subscapular fluid collection adjacent to rib fractures. History of MRSA bacteremia EXAM: CT GUIDANCE NEEDLE PLACEMENT FLUOROSCOPY TIME:  None MEDICATIONS AND MEDICAL HISTORY: Versed 2 mg, Fentanyl 150 mcg. Additional Medications: None. ANESTHESIA/SEDATION: Moderate sedation time: 12 minutes CONTRAST:  None PROCEDURE: The procedure, risks, benefits, and alternatives were explained to the patient. Questions regarding the procedure were encouraged and answered. The patient  understands and consents to the procedure. The left posterior thorax was prepped with ChloraPrep in a sterile fashion, and a sterile drape was applied covering the operative field. A sterile gown and sterile gloves were used for the procedure. Under CT guidance, an 18 gauge needle was inserted into the subscapular fluid collection. 3 cc serosanguineous fluid was aspirated and sent for culture. FINDINGS: Imaging documents needle placement in the subcapsular fluid collection. Post aspiration imaging demonstrates no pneumothorax or hemorrhage. COMPLICATIONS: None IMPRESSION: Successful left subscapular fluid aspiration yielding 3 cc serosanguineous fluid. Electronically Signed   By: Jolaine Click M.D.   On: 08/13/2015 13:37      Assessment/Plan:  INTERVAL HISTORY:   07/2015: original cultures ID with MRSA, repeat blood cultures unfortunately PERSISTENTLY POsitive in 1/2  2/21--08/11/15: 2/19 blood cultures still + for MRSA, repeat blood cultures from 08/09/15: no growth  08/11/15--08/13/15: MRI T and L spine performed which showed R> L pleural effusion, Rim enhancing fluid collections in the posterior left chest wall fluid - associated with multiple displaced rib fractures - and in the right gluteal muscle fluid collection are suspicious for phlegmon/abscess   Pt is sp IR guided aspirate of the chest wall fluid collection  Principal Problem:   Sepsis (HCC) Active Problems:   UTI (lower urinary tract infection)   DM type 2, goal HbA1c < 7% (HCC)   Chronic hyponatremia   Infection   Hip fracture requiring operative repair (HCC)   MRSA bacteremia   Staphylococcus aureus bacteremia with sepsis (HCC)   Osteomyelitis of right hip (HCC)   Hardware complicating wound infection (HCC)   Sepsis due to methicillin resistant Staphylococcus aureus (MRSA) (HCC)   Uncontrolled type 2 diabetes mellitus with complication (HCC)   Acute urinary retention  Low back pain   Muscle spasms of both lower  extremities   Controlled diabetes mellitus type 2 with complications (HCC)   Hypokalemia   Acute pyelonephritis   Abscess   Chest wall abscess   Pleural effusion    Jeff Ferrell is a 65 y.o. male with  MRSA bacteremia sepsis due to Right hip hardware associated hip infection   #1 MRSA bacteremia     [facility] Staphylococcus aureus bacteremia   Staphylococcus aureus bacteremia (SAB) is associated with a high rate of complications and mortality.  Specific aspects of clinical management are critical to optimizing the outcome of patients with SAB.  Therefore, the [facility] Management [facility]) has initiated an intervention aimed at improving the management of SAB at [facility].  To do so, Infectious Diseases physicians are providing an evidence-based consult for the management of all patients with SAB.     Yes No Comments  Perform follow-up blood cultures (even if the patient is afebrile) to ensure clearance of bacteremia [x]  []  Repeat blood cultures 08/08/15 still positive for MRSA 2/2  repeat cultures 08/09/15 NG x 4 days  Remove vascular catheter and obtain follow-up blood cultures after the removal of the catheter []  []  DO NOT PLACE PICC OR CENTRAL LINE FOR THE NEXT cultures from 08/09/15 are NO growth FINAL  Perform echocardiography to evaluate for endocarditis (transthoracic ECHO is 40-50% sensitive, TEE is > 90% sensitive) []  []  Please keep in mind, that neither test can definitively EXCLUDE endocarditis, and that should clinical suspicion remain high for endocarditis the patient should then still be treated with an "endocarditis" duration of therapy = 6 weeks  TEE without vegetations  Consult electrophysiologist to evaluate implanted cardiac device (pacemaker, ICD) []  []    Ensure source control []  []  Have all abscesses been drained effectively? Have deep seeded infections (septic joints or osteomyelitis) had appropriate surgical  debridement?  He has had I and D of hip. He still has hardware that may need to be removed but trying to keep it in for now due to his fracture   Investigate for "metastatic" sites of infection []  []  Does the patient have ANY symptom or physical exam finding that would suggest a deeper infection (back or neck pain that may be suggestive of vertebral osteomyelitis or epidural abscess, muscle pain that could be a symptom of pyomyositis)?  Keep in mind that for deep seeded infections MRI imaging with contrast is preferred rather than other often insensitive tests such as plain x-rays, especially early in a patient's presentation.  I am concerned about his worsening mid back and lower back pain. MRI L spine done in January did not show diskitis  He has had aspirate of his chest wall fluid collection which seems to be another nidus, abscess  RIGHT GLUTEAL MUSCLE FLUID COLLECTION--WOULD ASK ORTHO TO REVIEW IF NOT DRAINED WOULD GET REPEAT MRI IN THE NEXT 10 DAYS   Change antibiotic therapy to Vancomycin []  []  Beta-lactam antibiotics are preferred for MSSA due to higher cure rates.   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 Would discuss with [facility], May NEED TO REPEAT MRI In next 10 DAYS TO SEE IF IT BECOMES A MORE WELL DEFINED ABSCESS FOR I AND D  #5  Pleural effusions: would have low threshold for IR guided aspiration for cell count and differential, culture  Dr. Orvan Falconer is covering over the weekend and I will be back on Monday.    LOS: 6 days   Acey Lav 08/13/2015, 7:06 PM

## 2015-08-13 NOTE — Sedation Documentation (Signed)
Pt transferred from  Endo. Pt has received  Versed and fentanyl. MD aware. Pt has consented to CT aspiration. Pt arrives to IR groggy, words slurred.

## 2015-08-13 NOTE — Sedation Documentation (Signed)
Small amount bloody drainage aspirated. MD wants RN to give Fentanyl IV for discomfort and moaning

## 2015-08-13 NOTE — H&P (Signed)
Patient ID: Jeff Ferrell MRN: [id number] DOB/AGE: 1951/02/12 65 y.o.  Admit date: 08/07/2015 Primary Physician [name], MD    Chief Complaint  MRSA bacteremia  HPI: [name] is a 89M with DM type 2 here with a surgical right hip infection s/p hip reair on 2/1 and MRSA bacteremia.  He presented 2/18 with drainage from the incision site and was found to have MRSA bacteremia.  He underwent irrigation and debridement of the hip on 2/19.  Transthoracic echo is pending.  Past Medical History  Diagnosis Date  . Diabetes mellitus     regulated by diet  . Chronic back pain   . 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 Diabetic osteomyelitis (HCC) 03/29/2015  . Diabetic foot ulcer (HCC) 03/29/2015    Medications Prior to Admission  Medication Sig Dispense Refill  . acetaminophen (TYLENOL) 325 MG tablet Take 2 tablets (650 mg total) by mouth every 6 (six) hours as needed for mild pain (or Fever >/= 101).    Marland Kitchen docusate sodium (COLACE) 100 MG capsule Take 1 capsule (100 mg total) by mouth 2 (two) times daily. 60 capsule 0  . enoxaparin (LOVENOX) 40 MG/0.4ML injection Inject 0.4 mLs (40 mg total) into the skin daily. 14 Syringe 0  . gabapentin (NEURONTIN) 100 MG capsule Take 100 mg by mouth 3 (three) times daily.    . magnesium hydroxide (MILK OF MAGNESIA) 400 MG/5ML suspension Take 30 mLs by mouth daily as needed for mild constipation.    . methocarbamol (ROBAXIN) 500 MG tablet Take 1 tablet (500 mg total) by mouth 4 (four) times daily. 90 tablet 0  . oxyCODONE (OXY IR/ROXICODONE) 5 MG immediate release tablet Take 1-3 tablets (5-15 mg total) by mouth every 4 (four) hours as needed. (Patient taking differently: Take 5-15 mg by mouth every 4 (four) hours as needed (mild to moderate to severe pain.). 5 mg=mild pain 10 mg=moderate pain 15=severe pain.) 30 tablet 0  . oxyCODONE-acetaminophen (PERCOCET) 5-325 MG tablet Take 1-2 tablets by mouth every 4 (four) hours as needed for  severe pain. 90 tablet 0  . sennosides-docusate sodium (SENOKOT-S) 8.6-50 MG tablet Take 1 tablet by mouth 2 (two) times daily.    . sitaGLIPtin (JANUVIA) 50 MG tablet Take 1 tablet (50 mg total) by mouth daily. 30 tablet 1  . tamsulosin (FLOMAX) 0.4 MG CAPS capsule Take 1 capsule (0.4 mg total) by mouth daily. 30 capsule 1     . sodium chloride   Intravenous Once  . aspirin EC  325 mg Oral Daily  . Chlorhexidine Gluconate Cloth  6 each Topical Q0600  . docusate sodium  100 mg Oral BID  . feeding supplement (ENSURE ENLIVE)  237 mL Oral TID BM  . gabapentin  100 mg Oral TID  . insulin aspart  0-5 Units Subcutaneous QHS  . insulin aspart  0-9 Units Subcutaneous TID WC  . methocarbamol  750 mg Oral QID  . midodrine  10 mg Oral To PACU  . mupirocin ointment  1 application Nasal BID  . rifampin  300 mg Oral Daily  . tamsulosin  0.4 mg Oral Daily  . vancomycin  1,250 mg Intravenous Q8H    Infusions: . sodium chloride 100 mL/hr at 08/13/15 0552  . lactated ringers 50 mL/hr at 08/12/15 2222    Allergies  Allergen Reactions  . Penicillins Hives and Rash    Has patient had a PCN reaction causing immediate rash, facial/tongue/throat swelling, SOB or lightheadedness with hypotension: Yes Has  patient had a PCN reaction causing severe rash involving mucus membranes or skin necrosis: Yes   Has patient had a PCN reaction that required hospitalization No Has patient had a PCN reaction occurring within the last 10 years: No If all of the above answers are "NO", then may proceed with Cephalosporin use.     Social History   Social History  . Marital Status: Single    Spouse Name: N/A  . Number of Children: N/A  . Years of Education: N/A   Occupational History  . Not on file.   Social History Main Topics  . Smoking status: Never Smoker   . Smokeless tobacco: Not on file  . Alcohol Use: 3.6 oz/week    6 Cans of beer per week     Comment: daily  . Drug Use: No  . Sexual Activity:  Not on file   Other Topics Concern  . Not on file   Social History Narrative    History reviewed. No pertinent family history.  PHYSICAL EXAM: Filed Vitals:   08/13/15 0300 08/13/15 0758  BP: 125/68 120/65  Pulse: 95 98  Temp: 97.6 F (36.4 C) 97.8 F (36.6 C)  Resp: 26 25     Intake/Output Summary (Last 24 hours) at 08/13/15 0801 Last data filed at 08/13/15 0700  Gross per 24 hour  Intake 3183.34 ml  Output   5350 ml  Net -2166.66 ml    General:  Chronically ill-appearing.  No respiratory difficulty HEENT: mucous membranes dry Neck: supple. no JVD. Carotids 2+ bilaterally Cor: PMI nondisplaced. Regular rate & rhythm. No rubs, gallops or murmurs. Lungs: clear Abdomen: soft, nontender, nondistended. No hepatosplenomegaly. No bruits or masses. Good bowel sounds. Extremities: no cyanosis, clubbing, rash, edema Neuro: alert & oriented x 3, cranial nerves grossly intact. moves all 4 extremities w/o difficulty. Affect pleasant.  Results for orders placed or performed during the hospital encounter of 08/07/15 (from the past 24 hour(s))  Glucose, capillary     Status: None   Collection Time: 08/12/15 12:29 PM  Result Value Ref Range   Glucose-Capillary 92 65 - 99 mg/dL   Comment 1 Notify RN    Comment 2 Document in Chart   Glucose, capillary     Status: Abnormal   Collection Time: 08/12/15  4:56 PM  Result Value Ref Range   Glucose-Capillary 108 (H) 65 - 99 mg/dL   Comment 1 Notify RN    Comment 2 Document in Chart   Glucose, capillary     Status: Abnormal   Collection Time: 08/12/15  8:59 PM  Result Value Ref Range   Glucose-Capillary 154 (H) 65 - 99 mg/dL  Comprehensive metabolic panel     Status: Abnormal   Collection Time: 08/13/15  5:18 AM  Result Value Ref Range   Sodium 130 (L) 135 - 145 mmol/L   Potassium 3.6 3.5 - 5.1 mmol/L   Chloride 95 (L) 101 - 111 mmol/L   CO2 26 22 - 32 mmol/L   Glucose, Bld 118 (H) 65 - 99 mg/dL   BUN <5 (L) 6 - 20 mg/dL    Creatinine, Ser 1.61 (L) 0.61 - 1.24 mg/dL   Calcium 8.4 (L) 8.9 - 10.3 mg/dL   Total Protein 5.5 (L) 6.5 - 8.1 g/dL   Albumin 2.2 (L) 3.5 - 5.0 g/dL   AST 21 15 - 41 U/L   ALT 9 (L) 17 - 63 U/L   Alkaline Phosphatase 177 (H) 38 - 126 U/L  Total Bilirubin 0.5 0.3 - 1.2 mg/dL   GFR calc non Af Amer >60 >60 mL/min   GFR calc Af Amer >60 >60 mL/min   Anion gap 9 5 - 15  CBC with Differential/Platelet     Status: Abnormal   Collection Time: 08/13/15  5:18 AM  Result Value Ref Range   WBC 6.8 4.0 - 10.5 K/uL   RBC 2.99 (L) 4.22 - 5.81 MIL/uL   Hemoglobin 7.8 (L) 13.0 - 17.0 g/dL   HCT 16.1 (L) 09.6 - 04.5 %   MCV 79.3 78.0 - 100.0 fL   MCH 26.1 26.0 - 34.0 pg   MCHC 32.9 30.0 - 36.0 g/dL   RDW 40.9 81.1 - 91.4 %   Platelets 388 150 - 400 K/uL   Neutrophils Relative % 68 %   Neutro Abs 4.6 1.7 - 7.7 K/uL   Lymphocytes Relative 15 %   Lymphs Abs 1.0 0.7 - 4.0 K/uL   Monocytes Relative 14 %   Monocytes Absolute 1.0 0.1 - 1.0 K/uL   Eosinophils Relative 3 %   Eosinophils Absolute 0.2 0.0 - 0.7 K/uL   Basophils Relative 0 %   Basophils Absolute 0.0 0.0 - 0.1 K/uL  Magnesium     Status: Abnormal   Collection Time: 08/13/15  5:18 AM  Result Value Ref Range   Magnesium 1.4 (L) 1.7 - 2.4 mg/dL   Mr Thoracic Spine W Wo Contrast  08/12/2015  ADDENDUM REPORT: 08/12/2015 13:50 ADDENDUM: Study discussed by telephone with Dr. Gwen Her Dam On 08/12/2015 at 1345 hours. Electronically Signed   By: Odessa Fleming M.D.   On: 08/12/2015 13:50  08/12/2015  CLINICAL DATA:  65 year old male with recent hip surgery, became septic, and now with new onset back pain. Query discitis osteomyelitis. Indeterminate signal changes of the L5-S1 disc space since December. Initial encounter. Left rib fractures detected on portable chest x-ray in January. EXAM: MRI THORACIC AND LUMBAR SPINE WITHOUT AND WITH CONTRAST TECHNIQUE: Multiplanar and multiecho pulse sequences of the thoracic and lumbar spine were obtained without and  with intravenous contrast. CONTRAST:  15mL MULTIHANCE GADOBENATE DIMEGLUMINE 529 MG/ML IV SOLN COMPARISON:  Hip intraoperative images 2117. Lumbar MRI 07/01/2015 and earlier. Thoracic and lumbar spine radiographs 11/24/2010. FINDINGS: MR THORACIC SPINE FINDINGS Limited sagittal imaging of the cervical spine is remarkable for diffuse disc degeneration with what appears to be at least moderate degenerative cervical spinal stenosis at C2-C3 and C3-C4 (series 2, image 6). Left greater than right layering pleural effusions with confluent abnormal increased signal in both lower lobes. Multilevel (at least 5 levels) displaced left posterior rib fractures re- demonstrated (series 7, image 14), and there is an associated rim enhancing fluid collection in the intercostal muscles measuring about 40 mm in length and 10 mm in width (series 22, image 15 and series 7, image 15). This appears to be a just deep to the left inferior scapula. Other posterior paraspinal soft tissues are within normal limits. Preserved thoracic vertebral height and alignment. T2 and T11 vertebral body benign hemangiomas. No thoracic vertebral marrow edema or evidence of acute osseous abnormality. No inflammatory signal of the thoracic intervertebral discs. Degenerative multifactorial mild thoracic spinal stenosis from T3-T4 through T9-T10. Mild if any associated thoracic spinal cord mass effect. Spinal cord signal is within normal limits at all visualized levels. No dural thickening or enhancement. No abnormal intradural enhancement. The conus medullaris appears normal at T12-L1. MR LUMBAR SPINE FINDINGS Same numbering system as on the recent comparison lumbar  MRIs. 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 Signal to noise in the lower lumbar levels is poor today, resulting in partial obscuration of the L3 through L5 vertebrae and thecal sac. The urinary bladder is severely distended. There is secondary hydronephrosis.  There is edema in the right gluteus muscle immediately posterior to the right iliac wing (series 17, image 34) with an associated small 2 cm mildly heterogeneous rim enhancing fluid collection (series 20, image 34). Otherwise postoperative changes to the lumbar posterior paraspinal soft tissues appear stable. Allowing for chronic hardware susceptibility artifact and the suboptimal signal to noise in the lumbar spine today, there is no acute osseous abnormality or interval change in the appearance of the lumbar disc spaces. There is chronic adjacent segment disease with mild to moderate multifactorial spinal stenosis at L1-L2. The conus medullaris appears normal at T12-L1. No abnormal intradural enhancement is evident. IMPRESSION: 1. Massively distended urinary bladder, causing secondary hydronephrosis. Recommend bladder decompression with Foley catheter. 2. No evidence of acute thoracic or lumbar spinal infection. Suboptimal MRI visualization of the lumbar spine today. 3. Rim enhancing fluid collections in the posterior left chest wall fluid - associated with multiple displaced rib fractures - and in the right gluteal muscle fluid collection are suspicious for phlegmon/abscess in this setting. 4. Left greater than right pleural effusions with bilateral lower lobe pneumonia. 5. Degenerative thoracic spinal stenosis from T3-T4 to T9-T10. Stable degenerative lumbar spinal stenosis at L1-L2. At least moderate degenerative cervical spinal stenosis at C2-C3 and C3-C4. Electronically Signed: By: Odessa Fleming M.D. On: 08/12/2015 13:32   Mr Lumbar Spine W Wo Contrast  08/12/2015  ADDENDUM REPORT: 08/12/2015 13:50 ADDENDUM: Study discussed by telephone with Dr. Gwen Her Dam On 08/12/2015 at 1345 hours. Electronically Signed   By: Odessa Fleming M.D.   On: 08/12/2015 13:50  08/12/2015  CLINICAL DATA:  65 year old male with recent hip surgery, became septic, and now with new onset back pain. Query discitis osteomyelitis. Indeterminate  signal changes of the L5-S1 disc space since December. Initial encounter. Left rib fractures detected on portable chest x-ray in January. EXAM: MRI THORACIC AND LUMBAR SPINE WITHOUT AND WITH CONTRAST TECHNIQUE: Multiplanar and multiecho pulse sequences of the thoracic and lumbar spine were obtained without and with intravenous contrast. CONTRAST:  15mL MULTIHANCE GADOBENATE DIMEGLUMINE 529 MG/ML IV SOLN COMPARISON:  Hip intraoperative images 2117. Lumbar MRI 07/01/2015 and earlier. Thoracic and lumbar spine radiographs 11/24/2010. FINDINGS: MR THORACIC SPINE FINDINGS Limited sagittal imaging of the cervical spine is remarkable for diffuse disc degeneration with what appears to be at least moderate degenerative cervical spinal stenosis at C2-C3 and C3-C4 (series 2, image 6). Left greater than right layering pleural effusions with confluent abnormal increased signal in both lower lobes. Multilevel (at least 5 levels) displaced left posterior rib fractures re- demonstrated (series 7, image 14), and there is an associated rim enhancing fluid collection in the intercostal muscles measuring about 40 mm in length and 10 mm in width (series 22, image 15 and series 7, image 15). This appears to be a just deep to the left inferior scapula. Other posterior paraspinal soft tissues are within normal limits. Preserved thoracic vertebral height and alignment. T2 and T11 vertebral body benign hemangiomas. No thoracic vertebral marrow edema or evidence of acute osseous abnormality. No inflammatory signal of the thoracic intervertebral discs. Degenerative multifactorial mild thoracic spinal stenosis from T3-T4 through T9-T10. Mild if any associated thoracic spinal cord mass effect. Spinal cord signal is within  normal limits at all visualized levels. No dural thickening or enhancement. No abnormal intradural enhancement. The conus medullaris appears normal at T12-L1. MR LUMBAR SPINE FINDINGS Same numbering system as on the recent  comparison lumbar MRIs. 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 Signal to noise in the lower lumbar levels is poor today, resulting in partial obscuration of the L3 through L5 vertebrae and thecal sac. The urinary bladder is severely distended. There is secondary hydronephrosis. There is edema in the right gluteus muscle immediately posterior to the right iliac wing (series 17, image 34) with an associated small 2 cm mildly heterogeneous rim enhancing fluid collection (series 20, image 34). Otherwise postoperative changes to the lumbar posterior paraspinal soft tissues appear stable. Allowing for chronic hardware susceptibility artifact and the suboptimal signal to noise in the lumbar spine today, there is no acute osseous abnormality or interval change in the appearance of the lumbar disc spaces. There is chronic adjacent segment disease with mild to moderate multifactorial spinal stenosis at L1-L2. The conus medullaris appears normal at T12-L1. No abnormal intradural enhancement is evident. IMPRESSION: 1. Massively distended urinary bladder, causing secondary hydronephrosis. Recommend bladder decompression with Foley catheter. 2. No evidence of acute thoracic or lumbar spinal infection. Suboptimal MRI visualization of the lumbar spine today. 3. Rim enhancing fluid collections in the posterior left chest wall fluid - associated with multiple displaced rib fractures - and in the right gluteal muscle fluid collection are suspicious for phlegmon/abscess in this setting. 4. Left greater than right pleural effusions with bilateral lower lobe pneumonia. 5. Degenerative thoracic spinal stenosis from T3-T4 to T9-T10. Stable degenerative lumbar spinal stenosis at L1-L2. At least moderate degenerative cervical spinal stenosis at C2-C3 and C3-C4. Electronically Signed: By: Odessa Fleming M.D. On: 08/12/2015 13:32     ASSESSMEN/PLAN:  66M with DM type 2 here with a surgical right hip infection  s/p hip reair on 2/1 and MRSA bacteremia.    Herny Scurlock is a 65 y.o. male who has presented today for TEE, with the diagnosis of MRSA bacteremia. The various methods of treatment have been discussed with the patient and family. After consideration of risks, benefits and other options for treatment, the patient has consented to Procedure(s): TRANSESOPHAGEAL ECHOCARDIOGRAM (TEE) (N/A) as a surgical intervention . The patient's history has been reviewed, patient examined, no change in status, stable for surgery. I have reviewed the patient's chart and labs. Questions were answered to the patient's satisfaction.    Signed: Stephanie Mcglone C. [name] Salvia, MD, [facility]  08/13/2015, 8:01 AM

## 2015-08-13 NOTE — Progress Notes (Signed)
[facility] TEAM 1 - Stepdown/ICU TEAM Progress Note  Jeff Ferrell ZOX:[id number] DOB: 12-Mar-1951 DOA: 08/07/2015 PCP: [name], MD  Admit HPI / Brief Narrative: 65 y.o. BM PMHx DM Type 2  not on insulin, Diabetic Foot Ulcer, Chronic Hyponatremia, Left Toe Osteomyelitis, S/P L-spine fusion, Recent fracture Right Femoral neck with repair on 2/1   Presents because of drainage from the wound. He is noted to be tachycardic with a low-grade fever of 99 and a suspicion of a wound infection and therefore is being admitted to the hospital. Orthopedic surgery plans on taking him to the OR for drainage of the wound either today or tomorrow. He is also noted to have a UA that is positive for pyuria. He does not admit to any dysuria, suprapubic pain or increased frequency of micturition.  HPI/Subjective: 2/24 A/O 4, continued right hip pain with frequent muscle spasms.    Assessment/Plan: Positive MRSA Bacteremia/ positive MRSA Rt hip surgical wound -taken to the OR for I&D 2/19 - care per ID and Ortho - extent of additional evaluation to be delineated by ID  -TTE; normal see results below  -TEE; negative valvular vegetation  Sepsis w/ positive MRSA Pyelonephritis & Hydronephrosis /Acute urinary retention -Place Foley for decompression -Continue current antibiotics per ID  Left chest wall Phlegmon/Abscess -2/24/ S/P IR aspiration    Bibasilar Pneumonia  -Continue current antibiotics  Bilateral pleural effusions -May require thoracentesis but will hold off until IR drains chest wall abscess -2/24 PCXR in the A.m.  Muscle spasms bilateral lower extremity -Increase Robaxin 1000 mg TID -Continue Valium 5 mg QID PRN  DM Type 2 controlled with complications -2/21 Hemoglobin A1c= 5.7 -Continue sensitive SSI  Chronic Hyponatremia -Workup accomplished during last hospital stay concluded hypovolemia was etiology - stable at present - follow   Hypokalemia -continue to  monitor  Hypomagnesemia -Magnesium IV 3 gm  Normocytic anemia  -transfuse w/ Hgb now < 7.0 - follow up after transfusion    Code Status: FULL Family Communication: no family present at time of exam Disposition Plan: Resolution sepsis    Consultants: Dr Randall Hiss. ID Dr. Tarry Kos [facility] Dr.Arthur Hoss IR    Procedure/Significant Events: 2/19 - I&D infected Rt hip wound  2/22 echocardiogram;normal, negative vegetation 2/23 MRI T-spine/L-spine;-Massively distended urinary bladder, causing  2dary Hydronephrosis. -No evidence of acute thoracic or lumbar spinal infection. -Rim enhancing fluid collections in the posterior left chest wall fluid - associated with multiple displaced rib fractures - and in the right gluteal muscle fluid collection are suspicious for phlegmon/abscess - Lt >>Rt pleural effusions with bilateral lower lobe pneumonia. -Degenerative thoracic spinal stenosis Stable degenerative lumbar spinal stenosis; moderate degenerative cervical spinal stenosis  2/24 TTE;- LVEF= 60% to 65%. WMN-negative valvular thrombus - Pericardium, extracardiac: A trivial pericardial effusion  2/24Lt scapula fluid aspiration;3 cc serosanguinous fluid    Culture 2/18 blood left hand positive staph aureus 2/18 urine positive MRSA 2/19 MRSA by PCR positive 2/19 right leg wound 2 positive MRSA 2/19 blood right AC positive MRSA 2/19 blood right arm NGTD 2/20 blood right forearm/AC pending 2/23 sputum pending    Antibiotics: Aztreonam 2/18>> 2/19 Ceftriaxone 2/19 >> 2/19 Rifampin 2/19 >  Vancomycin 2/18 >>   DVT prophylaxis: Lovenox   Devices Wound VAC in place right hip   LINES / TUBES:      Continuous Infusions: . sodium chloride 100 mL/hr at 08/13/15 0552    Objective: VITAL SIGNS: Temp: 98.4 F (36.9 C) (02/24  1459) Temp Source: Oral (02/24 1459) BP: 147/64 mmHg (02/24 1459) Pulse Rate: 100 (02/24  1459) SPO2; FIO2:   Intake/Output Summary (Last 24 hours) at 08/13/15 1728 Last data filed at 08/13/15 1600  Gross per 24 hour  Intake 3661.67 ml  Output   2950 ml  Net 711.67 ml     Exam: General: A/O 4, increasing right hip pain/back pain/muscle spasm, No acute respiratory distress Eyes: Negative headache, negative scleral hemorrhage ENT: Negative Runny nose, negative gingival bleeding, Neck:  Negative scars, masses, torticollis, lymphadenopathy, JVD Lungs: Clear to auscultation bilaterally,  without wheezes or crackles Cardiovascular: Regular rhythm and rate without murmur gallop or rub normal S1 and S2 Abdomen:negative abdominal pain, nondistended, positive soft, bowel sounds, no rebound, no ascites, no appreciable mass Back: Increasing pain to palpation midline from T8-S2 Extremities: bilateral foot cyanosis, however positive DP/PT pulse, left great toe amputated healed well, significant onychomycosis all toes. Right lateral hip incision with wound VAC, draining sanguineous fluid. Hip  swollen (but significantly decreased), Psychiatric:  Negative depression, negative anxiety, negative fatigue, negative mania  Neurologic:  Cranial nerves II through XII intact, tongue/uvula midline, all extremities muscle strength 5/5, sensation intact throughout, negative dysarthria, negative expressive aphasia, negative receptive aphasia.   Data Reviewed: Basic Metabolic Panel:  Recent Labs Lab 08/09/15 0434 08/10/15 0507 08/11/15 1200 08/12/15 0540 08/13/15 0518  NA 127* 128* 126* 130* 130*  K 3.5 3.2* 3.1* 3.6 3.6  CL 94* 94* 90* 96* 95*  CO2 21* GLUCOSE 90 90 197* 108* 118*  BUN <5* <5* <5* <5* <5*  CREATININE 0.62 0.59* 0.53* 0.44* 0.35*  CALCIUM 8.8* 8.8* 8.3* 8.3* 8.4*  MG  --   --  1.2* 1.3* 1.4*   Liver Function Tests:  Recent Labs Lab 08/09/15 0434 08/10/15 0507 08/11/15 1200 08/12/15 0540 08/13/15 0518  AST ALT 15* 12* 12* 10* 9*   ALKPHOS 163* 193* 191* 178* 177*  BILITOT 1.3* 1.6* 0.8 0.6 0.5  PROT 6.1* 5.9* 5.5* 5.7* 5.5*  ALBUMIN 2.6* 2.4* 2.1* 2.2* 2.2*   No results for input(s): LIPASE, AMYLASE in the last 168 hours. No results for input(s): AMMONIA in the last 168 hours. CBC:  Recent Labs Lab 08/07/15 1053  08/10/15 0507 08/11/15 1200 08/11/15 2030 08/12/15 0540 08/13/15 0518  WBC 14.0*  < > 8.3 7.1 7.0 7.3 6.8  NEUTROABS 11.8*  --   --   --  5.0 4.7 4.6  HGB 8.9*  < > 7.8* 7.5* 7.9* 7.7* 7.8*  HCT 27.4*  < > 23.9* 22.5* 23.5* 22.8* 23.7*  MCV 78.5  < > 76.6* 77.3* 76.1* 77.6* 79.3  PLT 464*  < > 315 303 364 346 388  < > = values in this interval not displayed. Cardiac Enzymes: No results for input(s): CKTOTAL, CKMB, CKMBINDEX, TROPONINI in the last 168 hours. BNP (last 3 results)  Recent Labs  06/11/15 1106 07/02/15 0538  BNP 27.7 34.0    ProBNP (last 3 results) No results for input(s): PROBNP in the last 8760 hours.  CBG:  Recent Labs Lab 08/12/15 1656 08/12/15 2059 08/13/15 0801 08/13/15 1224 08/13/15 1700  GLUCAP 108* 154* 104* 125* 152*    Recent Results (from the past 240 hour(s))  Blood Culture (routine x 2)     Status: None   Collection Time: 08/07/15 10:47 AM  Result Value Ref Range Status   Specimen Description LEFT ANTECUBITAL  Final   Special Requests  BOTTLES DRAWN AEROBIC AND ANAEROBIC 5CC  Final   Culture  Setup Time   Final    GRAM POSITIVE COCCI IN CLUSTERS IN BOTH AEROBIC AND ANAEROBIC BOTTLES CRITICAL RESULT CALLED TO, READ BACK BY AND VERIFIED WITH: TKayren Eaves RN [phone] GREEN R CONFIRMED BY M. CAMPBELL    Culture METHICILLIN RESISTANT STAPHYLOCOCCUS AUREUS  Final   Report Status 08/10/2015 FINAL  Final   Organism ID, Bacteria METHICILLIN RESISTANT STAPHYLOCOCCUS AUREUS  Final      Susceptibility   Methicillin resistant staphylococcus aureus - MIC*    CIPROFLOXACIN >=8 RESISTANT Resistant     ERYTHROMYCIN >=8 RESISTANT Resistant     GENTAMICIN  <=0.5 SENSITIVE Sensitive     OXACILLIN >=4 RESISTANT Resistant     TETRACYCLINE <=1 SENSITIVE Sensitive     VANCOMYCIN <=0.5 SENSITIVE Sensitive     TRIMETH/SULFA >=320 RESISTANT Resistant     CLINDAMYCIN <=0.25 SENSITIVE Sensitive     RIFAMPIN <=0.5 SENSITIVE Sensitive     Inducible Clindamycin NEGATIVE Sensitive     * METHICILLIN RESISTANT STAPHYLOCOCCUS AUREUS  Wound culture     Status: None   Collection Time: 08/07/15 10:53 AM  Result Value Ref Range Status   Specimen Description WOUND RIGHT HIP  Final   Special Requests SKIN DEEP EXCISION   Final   Gram Stain   Final    NO WBC SEEN NO SQUAMOUS EPITHELIAL CELLS SEEN NO ORGANISMS SEEN Performed at [facility]    Culture   Final    ABUNDANT METHICILLIN RESISTANT STAPHYLOCOCCUS AUREUS Note: RIFAMPIN AND GENTAMICIN SHOULD NOT BE USED AS SINGLE DRUGS FOR TREATMENT OF STAPH INFECTIONS. This organism DOES NOT demonstrate inducible Clindamycin resistance in vitro. CRITICAL RESULT CALLED TO, READ BACK BY AND VERIFIED WITH: WHITNEY DAVIS @  9:10 AM 08/10/15 BY DWEEKS Performed at [facility]    Report Status 08/10/2015 FINAL  Final   Organism ID, Bacteria METHICILLIN RESISTANT STAPHYLOCOCCUS AUREUS  Final      Susceptibility   Methicillin resistant staphylococcus aureus - MIC*    CLINDAMYCIN <=0.25 SENSITIVE Sensitive     ERYTHROMYCIN >=8 RESISTANT Resistant     GENTAMICIN <=0.5 SENSITIVE Sensitive     LEVOFLOXACIN >=8 RESISTANT Resistant     OXACILLIN >=4 RESISTANT Resistant     RIFAMPIN <=0.5 SENSITIVE Sensitive     TRIMETH/SULFA >=320 RESISTANT Resistant     VANCOMYCIN <=0.5 SENSITIVE Sensitive     TETRACYCLINE <=1 SENSITIVE Sensitive     * ABUNDANT METHICILLIN RESISTANT STAPHYLOCOCCUS AUREUS  Blood Culture (routine x 2)     Status: None   Collection Time: 08/07/15 11:18 AM  Result Value Ref Range Status   Specimen Description BLOOD LEFT HAND  Final   Special Requests BOTTLES DRAWN AEROBIC ONLY 10CC  Final    Culture  Setup Time   Final    GRAM POSITIVE COCCI IN CLUSTERS AEROBIC BOTTLE ONLY CRITICAL RESULT CALLED TO, READ BACK BY AND VERIFIED WITH: T. IRBY RN [phone] [id number] GREEN R CONFIRMED BY M. CAMPBELL    Culture   Final    STAPHYLOCOCCUS AUREUS SUSCEPTIBILITIES PERFORMED ON PREVIOUS CULTURE WITHIN THE LAST 5 DAYS.    Report Status 08/10/2015 FINAL  Final  Urine culture     Status: None   Collection Time: 08/07/15 12:23 PM  Result Value Ref Range Status   Specimen Description URINE, CATHETERIZED  Final   Special Requests NONE  Final   Culture   Final    >=100,000 COLONIES/mL METHICILLIN RESISTANT STAPHYLOCOCCUS  AUREUS   Report Status 08/09/2015 FINAL  Final   Organism ID, Bacteria METHICILLIN RESISTANT STAPHYLOCOCCUS AUREUS  Final      Susceptibility   Methicillin resistant staphylococcus aureus - MIC*    CIPROFLOXACIN >=8 RESISTANT Resistant     GENTAMICIN <=0.5 SENSITIVE Sensitive     NITROFURANTOIN <=16 SENSITIVE Sensitive     OXACILLIN >=4 RESISTANT Resistant     TETRACYCLINE <=1 SENSITIVE Sensitive     VANCOMYCIN 1 SENSITIVE Sensitive     TRIMETH/SULFA >=320 RESISTANT Resistant     CLINDAMYCIN <=0.25 SENSITIVE Sensitive     RIFAMPIN <=0.5 SENSITIVE Sensitive     Inducible Clindamycin NEGATIVE Sensitive     * >=100,000 COLONIES/mL METHICILLIN RESISTANT STAPHYLOCOCCUS AUREUS  MRSA PCR Screening     Status: Abnormal   Collection Time: 08/08/15  5:32 AM  Result Value Ref Range Status   MRSA by PCR POSITIVE (A) NEGATIVE Final    Comment:        The GeneXpert MRSA Assay (FDA approved for NASAL specimens only), is one component of a comprehensive MRSA colonization surveillance program. It is not intended to diagnose MRSA infection nor to guide or monitor treatment for MRSA infections. RESULT CALLED TO, READ BACK BY AND VERIFIED WITH: RN GORDON,R AT 1002 [phone] MARTINB   Anaerobic culture     Status: None   Collection Time: 08/08/15  8:28 AM  Result Value Ref Range  Status   Specimen Description WOUND RIGHT LEG  Final   Special Requests POF VANCOMYCIN LOWER RIGHT LEG INCISION PART A   Final   Gram Stain   Final    ABUNDANT WBC PRESENT, PREDOMINANTLY PMN NO SQUAMOUS EPITHELIAL CELLS SEEN FEW GRAM POSITIVE COCCI IN CLUSTERS IN PAIRS Performed at [facility]    Culture   Final    NO ANAEROBES ISOLATED Performed at [facility]    Report Status 08/13/2015 FINAL  Final  Wound culture     Status: None   Collection Time: 08/08/15  8:28 AM  Result Value Ref Range Status   Specimen Description WOUND RIGHT LEG  Final   Special Requests POF VANCOMYCIN RIGHT LOWER LEG INCISION PART A  Final   Gram Stain   Final    FEW WBC PRESENT, PREDOMINANTLY PMN NO SQUAMOUS EPITHELIAL CELLS SEEN RARE GRAM POSITIVE COCCI IN PAIRS Performed at [facility]    Culture   Final    MODERATE METHICILLIN RESISTANT STAPHYLOCOCCUS AUREUS Note: RIFAMPIN AND GENTAMICIN SHOULD NOT BE USED AS SINGLE DRUGS FOR TREATMENT OF STAPH INFECTIONS. This organism DOES NOT demonstrate inducible Clindamycin resistance in vitro. CRITICAL RESULT CALLED TO, READ BACK BY AND VERIFIED WITH: CENLINA S 2/22 @  0920 BY REAMM Performed at [facility]    Report Status 08/11/2015 FINAL  Final   Organism ID, Bacteria METHICILLIN RESISTANT STAPHYLOCOCCUS AUREUS  Final      Susceptibility   Methicillin resistant staphylococcus aureus - MIC*    CLINDAMYCIN <=0.25 SENSITIVE Sensitive     ERYTHROMYCIN >=8 RESISTANT Resistant     GENTAMICIN <=0.5 SENSITIVE Sensitive     LEVOFLOXACIN >=8 RESISTANT Resistant     OXACILLIN >=4 RESISTANT Resistant     RIFAMPIN <=0.5 SENSITIVE Sensitive     TRIMETH/SULFA >=320 RESISTANT Resistant     VANCOMYCIN 1 SENSITIVE Sensitive     TETRACYCLINE <=1 SENSITIVE Sensitive     * MODERATE METHICILLIN RESISTANT STAPHYLOCOCCUS AUREUS  Anaerobic culture     Status: None   Collection Time: 08/08/15  8:49 AM  Result Value Ref Range Status    Specimen Description WOUND RIGHT LEG  Final   Special Requests POF VANCOMYCIN PROXIMAL RIGHT LOWER LEG PART B  Final   Gram Stain   Final    ABUNDANT WBC PRESENT, PREDOMINANTLY PMN NO SQUAMOUS EPITHELIAL CELLS SEEN FEW GRAM POSITIVE COCCI IN PAIRS IN CLUSTERS Performed at [facility]    Culture   Final    NO ANAEROBES ISOLATED Performed at [facility]    Report Status 08/13/2015 FINAL  Final  Wound culture     Status: None   Collection Time: 08/08/15  8:49 AM  Result Value Ref Range Status   Specimen Description WOUND RIGHT LEG  Final   Special Requests POF VANCOMYCIN PROXIMAL RIGHT LEG INCISON PART B  Final   Gram Stain   Final    FEW WBC PRESENT, PREDOMINANTLY PMN NO SQUAMOUS EPITHELIAL CELLS SEEN FEW GRAM POSITIVE COCCI IN PAIRS IN CLUSTERS Performed at [facility]    Culture   Final    MODERATE METHICILLIN RESISTANT STAPHYLOCOCCUS AUREUS Note: RIFAMPIN AND GENTAMICIN SHOULD NOT BE USED AS SINGLE DRUGS FOR TREATMENT OF STAPH INFECTIONS. This organism DOES NOT demonstrate inducible Clindamycin resistance in vitro. CRITICAL RESULT CALLED TO, READ BACK BY AND VERIFIED WITH: CENLINA S 2/22 @  0920 BY REAMM Performed at [facility]    Report Status 08/11/2015 FINAL  Final   Organism ID, Bacteria METHICILLIN RESISTANT STAPHYLOCOCCUS AUREUS  Final      Susceptibility   Methicillin resistant staphylococcus aureus - MIC*    CLINDAMYCIN <=0.25 SENSITIVE Sensitive     ERYTHROMYCIN >=8 RESISTANT Resistant     GENTAMICIN <=0.5 SENSITIVE Sensitive     LEVOFLOXACIN >=8 RESISTANT Resistant     OXACILLIN >=4 RESISTANT Resistant     RIFAMPIN <=0.5 SENSITIVE Sensitive     TRIMETH/SULFA >=320 RESISTANT Resistant     VANCOMYCIN 1 SENSITIVE Sensitive     TETRACYCLINE <=1 SENSITIVE Sensitive     * MODERATE METHICILLIN RESISTANT STAPHYLOCOCCUS AUREUS  Culture, blood (Routine X 2) w Reflex to ID Panel     Status: None   Collection Time: 08/08/15 12:40 PM   Result Value Ref Range Status   Specimen Description BLOOD RIGHT ANTECUBITAL  Final   Special Requests IN PEDIATRIC BOTTLE 2CC  Final   Culture  Setup Time   Final    GRAM POSITIVE COCCI IN CLUSTERS PEDIATRIC BOTTLE CRITICAL RESULT CALLED TO, READ BACK BY AND VERIFIED WITH: W DAVIS,RN AT 1043 08/09/15 BY L BENFIELD    Culture METHICILLIN RESISTANT STAPHYLOCOCCUS AUREUS  Final   Report Status 08/11/2015 FINAL  Final   Organism ID, Bacteria METHICILLIN RESISTANT STAPHYLOCOCCUS AUREUS  Final      Susceptibility   Methicillin resistant staphylococcus aureus - MIC*    CIPROFLOXACIN >=8 RESISTANT Resistant     ERYTHROMYCIN >=8 RESISTANT Resistant     GENTAMICIN <=0.5 SENSITIVE Sensitive     OXACILLIN >=4 RESISTANT Resistant     TETRACYCLINE <=1 SENSITIVE Sensitive     VANCOMYCIN 1 SENSITIVE Sensitive     TRIMETH/SULFA >=320 RESISTANT Resistant     CLINDAMYCIN <=0.25 SENSITIVE Sensitive     RIFAMPIN <=0.5 SENSITIVE Sensitive     Inducible Clindamycin NEGATIVE Sensitive     * METHICILLIN RESISTANT STAPHYLOCOCCUS AUREUS  Culture, blood (Routine X 2) w Reflex to ID Panel     Status: None   Collection Time: 08/08/15 12:45 PM  Result Value Ref Range Status  Specimen Description BLOOD RIGHT ARM  Final   Special Requests BOTTLES DRAWN AEROBIC ONLY  5CC  Final   Culture NO GROWTH 5 DAYS  Final   Report Status 08/13/2015 FINAL  Final  Culture, blood (Routine X 2) w Reflex to ID Panel     Status: None (Preliminary result)   Collection Time: 08/09/15 12:09 PM  Result Value Ref Range Status   Specimen Description BLOOD BLOOD RIGHT FOREARM  Final   Special Requests IN PEDIATRIC BOTTLE .3CC  Final   Culture NO GROWTH 4 DAYS  Final   Report Status PENDING  Incomplete  Culture, blood (Routine X 2) w Reflex to ID Panel     Status: None (Preliminary result)   Collection Time: 08/09/15 12:15 PM  Result Value Ref Range Status   Specimen Description BLOOD RIGHT ANTECUBITAL  Final   Special Requests  BOTTLES DRAWN AEROBIC ONLY 6CC  Final   Culture NO GROWTH 4 DAYS  Final   Report Status PENDING  Incomplete     Studies:  Recent x-ray studies have been reviewed in detail by the Attending Physician  Scheduled Meds:  Scheduled Meds: . sodium chloride   Intravenous Once  . aspirin EC  325 mg Oral Daily  . Chlorhexidine Gluconate Cloth  6 each Topical Q0600  . docusate sodium  100 mg Oral BID  . feeding supplement (ENSURE ENLIVE)  237 mL Oral TID BM  . fentaNYL      . fentaNYL      . gabapentin  100 mg Oral TID  . insulin aspart  0-5 Units Subcutaneous QHS  . insulin aspart  0-9 Units Subcutaneous TID WC  . lidocaine      . methocarbamol  750 mg Oral QID  . midazolam      . rifampin  300 mg Oral Daily  . tamsulosin  0.4 mg Oral Daily  . vancomycin  1,250 mg Intravenous Q8H    Time spent on care of this patient: 40 mins   Veida Spira, Roselind Messier , MD  [facility] Office  [phone] Pager [phone]  On-Call/Text Page:      [email or web address]      password [email or web address]  If 7PM-7AM, please contact night-coverage [email or web address] Password [facility] 08/13/2015, 5:28 PM   LOS: 6 days   Care during the described time interval was provided by me .  I have reviewed this patient's available data, including medical history, events of note, physical examination, and all test results as part of my evaluation. I have personally reviewed and interpreted all radiology studies.   Carolyne Littles, MD [phone] Pager

## 2015-08-13 NOTE — Sedation Documentation (Signed)
Transporter here- taking pt back to floor. VS stable- awake and talking. Sats 98 R AIR. Report given to floor RN

## 2015-08-13 NOTE — Progress Notes (Signed)
  Echocardiogram Echocardiogram Transesophageal has been performed.  Jeff Ferrell 08/13/2015, 9:58 AM

## 2015-08-13 NOTE — Sedation Documentation (Signed)
Pt very restless in position needed for scan. Will medicate to gain comfort as long as VS stable

## 2015-08-14 ENCOUNTER — Inpatient Hospital Stay (HOSPITAL_COMMUNITY): Payer: Medicare Other

## 2015-08-14 LAB — CBC WITH DIFFERENTIAL/PLATELET
Basophils Absolute: 0 10*3/uL (ref 0.0–0.1)
Basophils Relative: 0 %
EOS ABS: 0.3 10*3/uL (ref 0.0–0.7)
EOS PCT: 4 %
HCT: 24.2 % — ABNORMAL LOW (ref 39.0–52.0)
Hemoglobin: 7.9 g/dL — ABNORMAL LOW (ref 13.0–17.0)
LYMPHS ABS: 1.4 10*3/uL (ref 0.7–4.0)
LYMPHS PCT: 18 %
MCH: 25.3 pg — AB (ref 26.0–34.0)
MCHC: 32.6 g/dL (ref 30.0–36.0)
MCV: 77.6 fL — AB (ref 78.0–100.0)
MONO ABS: 0.6 10*3/uL (ref 0.1–1.0)
MONOS PCT: 8 %
Neutro Abs: 5.4 10*3/uL (ref 1.7–7.7)
Neutrophils Relative %: 70 %
PLATELETS: 452 10*3/uL — AB (ref 150–400)
RBC: 3.12 MIL/uL — ABNORMAL LOW (ref 4.22–5.81)
RDW: 14.6 % (ref 11.5–15.5)
WBC: 7.7 10*3/uL (ref 4.0–10.5)

## 2015-08-14 LAB — CULTURE, BLOOD (ROUTINE X 2)
Culture: NO GROWTH
Culture: NO GROWTH

## 2015-08-14 LAB — COMPREHENSIVE METABOLIC PANEL
ALT: 13 U/L — AB (ref 17–63)
AST: 27 U/L (ref 15–41)
Albumin: 2.2 g/dL — ABNORMAL LOW (ref 3.5–5.0)
Alkaline Phosphatase: 208 U/L — ABNORMAL HIGH (ref 38–126)
Anion gap: 8 (ref 5–15)
BILIRUBIN TOTAL: 0.6 mg/dL (ref 0.3–1.2)
CHLORIDE: 98 mmol/L — AB (ref 101–111)
CO2: 24 mmol/L (ref 22–32)
CREATININE: 0.49 mg/dL — AB (ref 0.61–1.24)
Calcium: 8 mg/dL — ABNORMAL LOW (ref 8.9–10.3)
GFR calc Af Amer: >60 mL/min (ref >60)
GLUCOSE: 129 mg/dL — AB (ref 65–99)
POTASSIUM: 3.8 mmol/L (ref 3.5–5.1)
SODIUM: 130 mmol/L — AB (ref 135–145)
Total Protein: 5.9 g/dL — ABNORMAL LOW (ref 6.5–8.1)

## 2015-08-14 LAB — GLUCOSE, CAPILLARY
GLUCOSE-CAPILLARY: 113 mg/dL — AB (ref 65–99)
GLUCOSE-CAPILLARY: 154 mg/dL — AB (ref 65–99)
GLUCOSE-CAPILLARY: 128 mg/dL — AB (ref 65–99)
Glucose-Capillary: 157 mg/dL — ABNORMAL HIGH (ref 65–99)

## 2015-08-14 LAB — VANCOMYCIN, TROUGH: VANCOMYCIN TR: 25 ug/mL — AB (ref 10.0–20.0)

## 2015-08-14 LAB — MAGNESIUM: MAGNESIUM: 1.8 mg/dL (ref 1.7–2.4)

## 2015-08-14 MED ORDER — VANCOMYCIN HCL 10 G IV SOLR
1500.0000 mg | Freq: Two times a day (BID) | INTRAVENOUS | Status: DC
  Administered 2015-08-14 – 2015-08-23 (×18): 1500 mg via INTRAVENOUS
  Filled 2015-08-14 (×21): qty 1500

## 2015-08-14 MED ORDER — VANCOMYCIN HCL 10 G IV SOLR
1500.0000 mg | Freq: Two times a day (BID) | INTRAVENOUS | Status: DC
  Filled 2015-08-14: qty 1500

## 2015-08-14 NOTE — Progress Notes (Signed)
[facility] TEAM 1 - Stepdown/ICU TEAM Progress Note  Jeff Ferrell ZOX:[id number] DOB: 01-16-1951 DOA: 08/07/2015 PCP: [name], MD  Admit HPI / Brief Narrative: 65 y.o. BM PMHx DM Type 2  not on insulin, Diabetic Foot Ulcer, Chronic Hyponatremia, Left Toe Osteomyelitis, S/P L-spine fusion, Recent fracture Right Femoral neck with repair on 2/1   Presents because of drainage from the wound. He is noted to be tachycardic with a low-grade fever of 99 and a suspicion of a wound infection and therefore is being admitted to the hospital. Orthopedic surgery plans on taking him to the OR for drainage of the wound either today or tomorrow. He is also noted to have a UA that is positive for pyuria. He does not admit to any dysuria, suprapubic pain or increased frequency of micturition.  HPI/Subjective: 2/25 A/O 4, continued right hip pain with frequent muscle spasms. Patient understandably upset concerning current situation regarding MRSA in blood, bladder, hip wound, possibly chest wall   Assessment/Plan: Positive MRSA Bacteremia/ positive MRSA Rt hip surgical wound -taken to the OR for I&D 2/19 - care per ID and Ortho - extent of additional evaluation to be delineated by ID  -TTE; normal see results below  -TEE; negative valvular vegetation  Sepsis w/ positive MRSA Pyelonephritis & Hydronephrosis /Acute urinary retention -Place Foley for decompression -Continue current antibiotics per ID  Left chest wall Phlegmon/Abscess -2/24/ S/P IR aspiration    Bibasilar Pneumonia  -Continue current antibiotics  Bilateral pleural effusions -May require thoracentesis but will hold off until IR drains chest wall abscess -2/25 PCXR; does not show pleural effusion believe what is being read on MRI as a fusion is atelectasis will discuss case with Radiologist and ID in the A.m. as this would impact course of care  Muscle spasms bilateral lower extremity -Increase Robaxin 1000 mg TID -Continue  Valium 5 mg QID PRN  DM Type 2 controlled with complications -2/21 Hemoglobin A1c= 5.7 -Continue sensitive SSI  Chronic Hyponatremia -Workup accomplished during last hospital stay concluded hypovolemia was etiology - stable at present - follow   Hypokalemia -continue to monitor  Hypomagnesemia -Magnesium IV 3 gm  Normocytic anemia  -transfuse w/ Hgb now < 7.0 - follow up after transfusion    Code Status: FULL Family Communication: no family present at time of exam Disposition Plan: Resolution sepsis    Consultants: Dr Randall Hiss. ID Dr. Tarry Kos [facility] Dr.Arthur Hoss IR    Procedure/Significant Events: 2/19 - I&D infected Rt hip wound  2/22 echocardiogram;normal, negative vegetation 2/23 MRI T-spine/L-spine;-Massively distended urinary bladder, causing  2dary Hydronephrosis. -No evidence of acute thoracic or lumbar spinal infection. -Rim enhancing fluid collections in the posterior left chest wall fluid - associated with multiple displaced rib fractures - and in the right gluteal muscle fluid collection are suspicious for phlegmon/abscess - Lt >>Rt pleural effusions with bilateral lower lobe pneumonia. -Degenerative thoracic spinal stenosis Stable degenerative lumbar spinal stenosis; moderate degenerative cervical spinal stenosis  2/24 TTE;- LVEF= 60% to 65%. WMN-negative valvular thrombus - Pericardium, extracardiac: A trivial pericardial effusion  2/24Lt scapula fluid aspiration;3 cc serosanguinous fluid    Culture 2/18 blood left hand positive staph aureus 2/18 urine positive MRSA 2/19 MRSA by PCR positive 2/19 right leg wound 2 positive MRSA 2/19 blood right AC positive MRSA 2/19 blood right arm NGTD 2/20 blood right forearm/AC pending 2/23 sputum pending 2/24 less scapula aspiration NGTD    Antibiotics: Aztreonam 2/18>> 2/19 Ceftriaxone 2/19 >> 2/19 Rifampin 2/19 >  Vancomycin 2/18 >>   DVT  prophylaxis: Lovenox   Devices Wound VAC in place right hip   LINES / TUBES:      Continuous Infusions: . sodium chloride 100 mL/hr at 08/13/15 1610    Objective: VITAL SIGNS: Temp: 98.2 F (36.8 C) (02/25 1619) Temp Source: Oral (02/25 1619) BP: 100/49 mmHg (02/25 1619) Pulse Rate: 106 (02/25 1105) SPO2; FIO2:   Intake/Output Summary (Last 24 hours) at 08/14/15 1742 Last data filed at 08/14/15 1629  Gross per 24 hour  Intake 3121.67 ml  Output   4600 ml  Net -1478.33 ml     Exam: General: A/O 4, right hip pain/back pain/muscle spasm better controlled today, No acute respiratory distress Eyes: Negative headache, negative scleral hemorrhage ENT: Negative Runny nose, negative gingival bleeding, Neck:  Negative scars, masses, torticollis, lymphadenopathy, JVD Lungs: Clear to auscultation bilaterally,  without wheezes or crackles Cardiovascular: Regular rhythm and rate without murmur gallop or rub normal S1 and S2 Abdomen:negative abdominal pain, nondistended, positive soft, bowel sounds, no rebound, no ascites, no appreciable mass Back: Increasing pain to palpation midline from T8-S2 Extremities: bilateral foot cyanosis, however positive DP/PT pulse, left great toe amputated healed well, significant onychomycosis all toes. Right lateral hip incision with wound VAC, draining sanguineous fluid. Hip  swollen (but significantly decreased), Psychiatric:  Negative depression, negative anxiety, negative fatigue, negative mania  Neurologic:  Cranial nerves II through XII intact, tongue/uvula midline, all extremities muscle strength 5/5, sensation intact throughout, negative dysarthria, negative expressive aphasia, negative receptive aphasia.   Data Reviewed: Basic Metabolic Panel:  Recent Labs Lab 08/10/15 0507 08/11/15 1200 08/12/15 0540 08/13/15 0518 08/14/15 0221  NA 128* 126* 130* 130* 130*  K 3.2* 3.1* 3.6 3.6 3.8  CL 94* 90* 96* 95* 98*  CO2 24 25 26 26 24    GLUCOSE 90 197* 108* 118* 129*  BUN <5* <5* <5* <5* <5*  CREATININE 0.59* 0.53* 0.44* 0.35* 0.49*  CALCIUM 8.8* 8.3* 8.3* 8.4* 8.0*  MG  --  1.2* 1.3* 1.4* 1.8   Liver Function Tests:  Recent Labs Lab 08/10/15 0507 08/11/15 1200 08/12/15 0540 08/13/15 0518 08/14/15 0221  AST 20 26 21 21 27   ALT 12* 12* 10* 9* 13*  ALKPHOS 193* 191* 178* 177* 208*  BILITOT 1.6* 0.8 0.6 0.5 0.6  PROT 5.9* 5.5* 5.7* 5.5* 5.9*  ALBUMIN 2.4* 2.1* 2.2* 2.2* 2.2*   No results for input(s): LIPASE, AMYLASE in the last 168 hours. No results for input(s): AMMONIA in the last 168 hours. CBC:  Recent Labs Lab 08/11/15 1200 08/11/15 2030 08/12/15 0540 08/13/15 0518 08/14/15 0221  WBC 7.1 7.0 7.3 6.8 7.7  NEUTROABS  --  5.0 4.7 4.6 5.4  HGB 7.5* 7.9* 7.7* 7.8* 7.9*  HCT 22.5* 23.5* 22.8* 23.7* 24.2*  MCV 77.3* 76.1* 77.6* 79.3 77.6*  PLT 303 364 346 388 452*   Cardiac Enzymes: No results for input(s): CKTOTAL, CKMB, CKMBINDEX, TROPONINI in the last 168 hours. BNP (last 3 results)  Recent Labs  06/11/15 1106 07/02/15 0538  BNP 27.7 34.0    ProBNP (last 3 results) No results for input(s): PROBNP in the last 8760 hours.  CBG:  Recent Labs Lab 08/13/15 1700 08/13/15 2219 08/14/15 0753 08/14/15 1105 08/14/15 1617  GLUCAP 152* 130* 113* 154* 128*    Recent Results (from the past 240 hour(s))  Blood Culture (routine x 2)     Status: None   Collection Time: 08/07/15 10:47 AM  Result Value Ref Range Status  Specimen Description LEFT ANTECUBITAL  Final   Special Requests BOTTLES DRAWN AEROBIC AND ANAEROBIC 5CC  Final   Culture  Setup Time   Final    GRAM POSITIVE COCCI IN CLUSTERS IN BOTH AEROBIC AND ANAEROBIC BOTTLES CRITICAL RESULT CALLED TO, READ BACK BY AND VERIFIED WITH: TKayren Eaves RN [phone] GREEN R CONFIRMED BY M. CAMPBELL    Culture METHICILLIN RESISTANT STAPHYLOCOCCUS AUREUS  Final   Report Status 08/10/2015 FINAL  Final   Organism ID, Bacteria METHICILLIN  RESISTANT STAPHYLOCOCCUS AUREUS  Final      Susceptibility   Methicillin resistant staphylococcus aureus - MIC*    CIPROFLOXACIN >=8 RESISTANT Resistant     ERYTHROMYCIN >=8 RESISTANT Resistant     GENTAMICIN <=0.5 SENSITIVE Sensitive     OXACILLIN >=4 RESISTANT Resistant     TETRACYCLINE <=1 SENSITIVE Sensitive     VANCOMYCIN <=0.5 SENSITIVE Sensitive     TRIMETH/SULFA >=320 RESISTANT Resistant     CLINDAMYCIN <=0.25 SENSITIVE Sensitive     RIFAMPIN <=0.5 SENSITIVE Sensitive     Inducible Clindamycin NEGATIVE Sensitive     * METHICILLIN RESISTANT STAPHYLOCOCCUS AUREUS  Wound culture     Status: None   Collection Time: 08/07/15 10:53 AM  Result Value Ref Range Status   Specimen Description WOUND RIGHT HIP  Final   Special Requests SKIN DEEP EXCISION   Final   Gram Stain   Final    NO WBC SEEN NO SQUAMOUS EPITHELIAL CELLS SEEN NO ORGANISMS SEEN Performed at [facility]    Culture   Final    ABUNDANT METHICILLIN RESISTANT STAPHYLOCOCCUS AUREUS Note: RIFAMPIN AND GENTAMICIN SHOULD NOT BE USED AS SINGLE DRUGS FOR TREATMENT OF STAPH INFECTIONS. This organism DOES NOT demonstrate inducible Clindamycin resistance in vitro. CRITICAL RESULT CALLED TO, READ BACK BY AND VERIFIED WITH: WHITNEY DAVIS @  9:10 AM 08/10/15 BY DWEEKS Performed at [facility]    Report Status 08/10/2015 FINAL  Final   Organism ID, Bacteria METHICILLIN RESISTANT STAPHYLOCOCCUS AUREUS  Final      Susceptibility   Methicillin resistant staphylococcus aureus - MIC*    CLINDAMYCIN <=0.25 SENSITIVE Sensitive     ERYTHROMYCIN >=8 RESISTANT Resistant     GENTAMICIN <=0.5 SENSITIVE Sensitive     LEVOFLOXACIN >=8 RESISTANT Resistant     OXACILLIN >=4 RESISTANT Resistant     RIFAMPIN <=0.5 SENSITIVE Sensitive     TRIMETH/SULFA >=320 RESISTANT Resistant     VANCOMYCIN <=0.5 SENSITIVE Sensitive     TETRACYCLINE <=1 SENSITIVE Sensitive     * ABUNDANT METHICILLIN RESISTANT STAPHYLOCOCCUS AUREUS  Blood  Culture (routine x 2)     Status: None   Collection Time: 08/07/15 11:18 AM  Result Value Ref Range Status   Specimen Description BLOOD LEFT HAND  Final   Special Requests BOTTLES DRAWN AEROBIC ONLY 10CC  Final   Culture  Setup Time   Final    GRAM POSITIVE COCCI IN CLUSTERS AEROBIC BOTTLE ONLY CRITICAL RESULT CALLED TO, READ BACK BY AND VERIFIED WITH: T. IRBY RN [phone] GREEN R CONFIRMED BY M. CAMPBELL    Culture   Final    STAPHYLOCOCCUS AUREUS SUSCEPTIBILITIES PERFORMED ON PREVIOUS CULTURE WITHIN THE LAST 5 DAYS.    Report Status 08/10/2015 FINAL  Final  Urine culture     Status: None   Collection Time: 08/07/15 12:23 PM  Result Value Ref Range Status   Specimen Description URINE, CATHETERIZED  Final   Special Requests NONE  Final   Culture  Final    >=100,000 COLONIES/mL METHICILLIN RESISTANT STAPHYLOCOCCUS AUREUS   Report Status 08/09/2015 FINAL  Final   Organism ID, Bacteria METHICILLIN RESISTANT STAPHYLOCOCCUS AUREUS  Final      Susceptibility   Methicillin resistant staphylococcus aureus - MIC*    CIPROFLOXACIN >=8 RESISTANT Resistant     GENTAMICIN <=0.5 SENSITIVE Sensitive     NITROFURANTOIN <=16 SENSITIVE Sensitive     OXACILLIN >=4 RESISTANT Resistant     TETRACYCLINE <=1 SENSITIVE Sensitive     VANCOMYCIN 1 SENSITIVE Sensitive     TRIMETH/SULFA >=320 RESISTANT Resistant     CLINDAMYCIN <=0.25 SENSITIVE Sensitive     RIFAMPIN <=0.5 SENSITIVE Sensitive     Inducible Clindamycin NEGATIVE Sensitive     * >=100,000 COLONIES/mL METHICILLIN RESISTANT STAPHYLOCOCCUS AUREUS  MRSA PCR Screening     Status: Abnormal   Collection Time: 08/08/15  5:32 AM  Result Value Ref Range Status   MRSA by PCR POSITIVE (A) NEGATIVE Final    Comment:        The GeneXpert MRSA Assay (FDA approved for NASAL specimens only), is one component of a comprehensive MRSA colonization surveillance program. It is not intended to diagnose MRSA infection nor to guide or monitor treatment  for MRSA infections. RESULT CALLED TO, READ BACK BY AND VERIFIED WITH: RN GORDON,R AT 1002 [id number] MARTINB   Anaerobic culture     Status: None   Collection Time: 08/08/15  8:28 AM  Result Value Ref Range Status   Specimen Description WOUND RIGHT LEG  Final   Special Requests POF VANCOMYCIN LOWER RIGHT LEG INCISION PART A   Final   Gram Stain   Final    ABUNDANT WBC PRESENT, PREDOMINANTLY PMN NO SQUAMOUS EPITHELIAL CELLS SEEN FEW GRAM POSITIVE COCCI IN CLUSTERS IN PAIRS Performed at [facility]    Culture   Final    NO ANAEROBES ISOLATED Performed at [facility]    Report Status 08/13/2015 FINAL  Final  Wound culture     Status: None   Collection Time: 08/08/15  8:28 AM  Result Value Ref Range Status   Specimen Description WOUND RIGHT LEG  Final   Special Requests POF VANCOMYCIN RIGHT LOWER LEG INCISION PART A  Final   Gram Stain   Final    FEW WBC PRESENT, PREDOMINANTLY PMN NO SQUAMOUS EPITHELIAL CELLS SEEN RARE GRAM POSITIVE COCCI IN PAIRS Performed at [facility]    Culture   Final    MODERATE METHICILLIN RESISTANT STAPHYLOCOCCUS AUREUS Note: RIFAMPIN AND GENTAMICIN SHOULD NOT BE USED AS SINGLE DRUGS FOR TREATMENT OF STAPH INFECTIONS. This organism DOES NOT demonstrate inducible Clindamycin resistance in vitro. CRITICAL RESULT CALLED TO, READ BACK BY AND VERIFIED WITH: CENLINA S 2/22 @  0920 BY REAMM Performed at [facility]    Report Status 08/11/2015 FINAL  Final   Organism ID, Bacteria METHICILLIN RESISTANT STAPHYLOCOCCUS AUREUS  Final      Susceptibility   Methicillin resistant staphylococcus aureus - MIC*    CLINDAMYCIN <=0.25 SENSITIVE Sensitive     ERYTHROMYCIN >=8 RESISTANT Resistant     GENTAMICIN <=0.5 SENSITIVE Sensitive     LEVOFLOXACIN >=8 RESISTANT Resistant     OXACILLIN >=4 RESISTANT Resistant     RIFAMPIN <=0.5 SENSITIVE Sensitive     TRIMETH/SULFA >=320 RESISTANT Resistant     VANCOMYCIN 1 SENSITIVE Sensitive      TETRACYCLINE <=1 SENSITIVE Sensitive     * MODERATE METHICILLIN RESISTANT STAPHYLOCOCCUS AUREUS  Anaerobic culture  Status: None   Collection Time: 08/08/15  8:49 AM  Result Value Ref Range Status   Specimen Description WOUND RIGHT LEG  Final   Special Requests POF VANCOMYCIN PROXIMAL RIGHT LOWER LEG PART B  Final   Gram Stain   Final    ABUNDANT WBC PRESENT, PREDOMINANTLY PMN NO SQUAMOUS EPITHELIAL CELLS SEEN FEW GRAM POSITIVE COCCI IN PAIRS IN CLUSTERS Performed at [facility]    Culture   Final    NO ANAEROBES ISOLATED Performed at [facility]    Report Status 08/13/2015 FINAL  Final  Wound culture     Status: None   Collection Time: 08/08/15  8:49 AM  Result Value Ref Range Status   Specimen Description WOUND RIGHT LEG  Final   Special Requests POF VANCOMYCIN PROXIMAL RIGHT LEG INCISON PART B  Final   Gram Stain   Final    FEW WBC PRESENT, PREDOMINANTLY PMN NO SQUAMOUS EPITHELIAL CELLS SEEN FEW GRAM POSITIVE COCCI IN PAIRS IN CLUSTERS Performed at [facility]    Culture   Final    MODERATE METHICILLIN RESISTANT STAPHYLOCOCCUS AUREUS Note: RIFAMPIN AND GENTAMICIN SHOULD NOT BE USED AS SINGLE DRUGS FOR TREATMENT OF STAPH INFECTIONS. This organism DOES NOT demonstrate inducible Clindamycin resistance in vitro. CRITICAL RESULT CALLED TO, READ BACK BY AND VERIFIED WITH: CENLINA S 2/22 @  0920 BY REAMM Performed at [facility]    Report Status 08/11/2015 FINAL  Final   Organism ID, Bacteria METHICILLIN RESISTANT STAPHYLOCOCCUS AUREUS  Final      Susceptibility   Methicillin resistant staphylococcus aureus - MIC*    CLINDAMYCIN <=0.25 SENSITIVE Sensitive     ERYTHROMYCIN >=8 RESISTANT Resistant     GENTAMICIN <=0.5 SENSITIVE Sensitive     LEVOFLOXACIN >=8 RESISTANT Resistant     OXACILLIN >=4 RESISTANT Resistant     RIFAMPIN <=0.5 SENSITIVE Sensitive     TRIMETH/SULFA >=320 RESISTANT Resistant     VANCOMYCIN 1 SENSITIVE Sensitive      TETRACYCLINE <=1 SENSITIVE Sensitive     * MODERATE METHICILLIN RESISTANT STAPHYLOCOCCUS AUREUS  Culture, blood (Routine X 2) w Reflex to ID Panel     Status: None   Collection Time: 08/08/15 12:40 PM  Result Value Ref Range Status   Specimen Description BLOOD RIGHT ANTECUBITAL  Final   Special Requests IN PEDIATRIC BOTTLE 2CC  Final   Culture  Setup Time   Final    GRAM POSITIVE COCCI IN CLUSTERS PEDIATRIC BOTTLE CRITICAL RESULT CALLED TO, READ BACK BY AND VERIFIED WITH: W DAVIS,RN AT 1043 08/09/15 BY L BENFIELD    Culture METHICILLIN RESISTANT STAPHYLOCOCCUS AUREUS  Final   Report Status 08/11/2015 FINAL  Final   Organism ID, Bacteria METHICILLIN RESISTANT STAPHYLOCOCCUS AUREUS  Final      Susceptibility   Methicillin resistant staphylococcus aureus - MIC*    CIPROFLOXACIN >=8 RESISTANT Resistant     ERYTHROMYCIN >=8 RESISTANT Resistant     GENTAMICIN <=0.5 SENSITIVE Sensitive     OXACILLIN >=4 RESISTANT Resistant     TETRACYCLINE <=1 SENSITIVE Sensitive     VANCOMYCIN 1 SENSITIVE Sensitive     TRIMETH/SULFA >=320 RESISTANT Resistant     CLINDAMYCIN <=0.25 SENSITIVE Sensitive     RIFAMPIN <=0.5 SENSITIVE Sensitive     Inducible Clindamycin NEGATIVE Sensitive     * METHICILLIN RESISTANT STAPHYLOCOCCUS AUREUS  Culture, blood (Routine X 2) w Reflex to ID Panel     Status: None   Collection Time: 08/08/15 12:45 PM  Result Value Ref Range Status   Specimen Description BLOOD RIGHT ARM  Final   Special Requests BOTTLES DRAWN AEROBIC ONLY  5CC  Final   Culture NO GROWTH 5 DAYS  Final   Report Status 08/13/2015 FINAL  Final  Culture, blood (Routine X 2) w Reflex to ID Panel     Status: None   Collection Time: 08/09/15 12:09 PM  Result Value Ref Range Status   Specimen Description BLOOD BLOOD RIGHT FOREARM  Final   Special Requests IN PEDIATRIC BOTTLE .3CC  Final   Culture NO GROWTH 5 DAYS  Final   Report Status 08/14/2015 FINAL  Final  Culture, blood (Routine X 2) w Reflex to  ID Panel     Status: None   Collection Time: 08/09/15 12:15 PM  Result Value Ref Range Status   Specimen Description BLOOD RIGHT ANTECUBITAL  Final   Special Requests BOTTLES DRAWN AEROBIC ONLY 6CC  Final   Culture NO GROWTH 5 DAYS  Final   Report Status 08/14/2015 FINAL  Final  Culture, routine-abscess     Status: None (Preliminary result)   Collection Time: 08/13/15 12:16 PM  Result Value Ref Range Status   Specimen Description OTHER  Final   Special Requests LEFT SCAPULA  Final   Gram Stain PENDING  Incomplete   Culture   Final    NO GROWTH 1 DAY Performed at [facility]    Report Status PENDING  Incomplete     Studies:  Recent x-ray studies have been reviewed in detail by the Attending Physician  Scheduled Meds:  Scheduled Meds: . sodium chloride   Intravenous Once  . aspirin EC  325 mg Oral Daily  . docusate sodium  100 mg Oral BID  . feeding supplement (ENSURE ENLIVE)  237 mL Oral TID BM  . gabapentin  100 mg Oral TID  . insulin aspart  0-5 Units Subcutaneous QHS  . insulin aspart  0-9 Units Subcutaneous TID WC  . methocarbamol  1,000 mg Oral QID  . rifampin  300 mg Oral Daily  . tamsulosin  0.4 mg Oral Daily  . [START ON 08/15/2015] vancomycin  1,500 mg Intravenous Q12H    Time spent on care of this patient: 40 mins   WOODS, Roselind Messier , MD  [facility] Office  [phone] Pager - [phone]  On-Call/Text Page:      [email or web address]      password [email or web address]  If 7PM-7AM, please contact night-coverage [email or web address] Password [email or web address] 08/14/2015, 5:42 PM   LOS: 7 days   Care during the described time interval was provided by me .  I have reviewed this patient's available data, including medical history, events of note, physical examination, and all test results as part of my evaluation. I have personally reviewed and interpreted all radiology studies.   Carolyne Littles, MD [phone] Pager

## 2015-08-14 NOTE — Progress Notes (Signed)
Pharmacy Antibiotic Note  Jeff Ferrell is a 65 y.o. male admitted on 08/07/2015 with MRSA bacteremia, R hip osteo, and multiple abscesses concerning for infection.  Pharmacy has been consulted for Vancomycin dosing.  Today is day #8 of therapy.  Vancomycin trough today was elevated at 25 (goal 15-20).  Will adjust patient to a q12h regimen in based on elevated level and to facilitate outpt dosing upon discharge.  Plan: Vancomycin 1500 mg IV every 12 hours.  Goal trough 15-20 mcg/mL.  Will delay antibiotic start until 2/26 at 0600 to allow for drug clearance from high level.  Height:  (175.3 cm) Weight: 161 lb 2.5 oz (73.1 kg) IBW/kg (Calculated) : 70.7  Temp (24hrs), Avg:98.5 F (36.9 C), Min:98 F (36.7 C), Max:99.1 F (37.3 C)   Recent Labs Lab 08/07/15 1553  08/08/15 0315  08/10/15 0507 08/11/15 1200 08/11/15 1710 08/11/15 2030 08/12/15 0540 08/13/15 0518 08/14/15 0221 08/14/15 1252  WBC  --   < > 11.5*  < > 8.3 7.1  --  7.0 7.3 6.8 7.7  --   CREATININE  --   < > 0.52*  < > 0.59* 0.53*  --   --  0.44* 0.35* 0.49*  --   LATICACIDVEN 1.53  --  1.1  --   --   --   --   --   --   --   --   --   VANCOTROUGH  --   --   --   < > 7*  --  14  --   --   --   --  25*  < > = values in this interval not displayed.  Estimated Creatinine Clearance: 92.1 mL/min (by C-G formula based on Cr of 0.49).    Allergies  Allergen Reactions  . Penicillins Hives and Rash    Has patient had a PCN reaction causing immediate rash, facial/tongue/throat swelling, SOB or lightheadedness with hypotension: Yes Has patient had a PCN reaction causing severe rash involving mucus membranes or skin necrosis: Yes   Has patient had a PCN reaction that required hospitalization No Has patient had a PCN reaction occurring within the last 10 years: No If all of the above answers are "NO", then may proceed with Cephalosporin use.     Antimicrobials this admission: 2/19 Rifampin 2/19 Rocephin >>  2/20 2/18 Vanc 2/18 azactam >>2/19  Dose adjustments this admission: 2/21: VT=7, dose increased to 1gm q8h 2/22: VT=14 (done on time), increase to  q8h 2/25 VT = 25 (1hr early), change to  q12h  Microbiology results: 2/18 bld x 2 - MRSA (S: Clinda, Rifampin, TCN, Vanc, R: Septra) 2/18 R leg wound - abundant MRSA 2/19 R leg wound - MRSA 2/19 urine cx > 100k MRSA 2/19 blood x 2 - 1/2 MRSA 2/19 MRSA PCR- pos 2/20 Bld x 2 - ngtd  Thank you for allowing pharmacy to be a part of this patient's care.  [employer], Pharm.D., BCPS Clinical Pharmacist Pager [phone] 08/14/2015 3:29 PM

## 2015-08-14 NOTE — Clinical Social Work Note (Signed)
Clinical Social Worker continuing to follow patient and family for support and discharge planning needs.  Patient is from [facility] and plans to return at discharge pending bed availability.  Patient remains in the SDU at this time.  CSW remains available for support and to facilitate patient discharge needs once medically stable.  Macario Golds, [place] [phone] (Weekend Coverage)

## 2015-08-15 ENCOUNTER — Inpatient Hospital Stay (HOSPITAL_COMMUNITY): Payer: Medicare Other

## 2015-08-15 LAB — COMPREHENSIVE METABOLIC PANEL
ALT: 11 U/L — ABNORMAL LOW (ref 17–63)
AST: 26 U/L (ref 15–41)
Albumin: 2.2 g/dL — ABNORMAL LOW (ref 3.5–5.0)
Alkaline Phosphatase: 197 U/L — ABNORMAL HIGH (ref 38–126)
Anion gap: 10 (ref 5–15)
CHLORIDE: 102 mmol/L (ref 101–111)
CO2: 25 mmol/L (ref 22–32)
Calcium: 8.3 mg/dL — ABNORMAL LOW (ref 8.9–10.3)
Creatinine, Ser: 0.62 mg/dL (ref 0.61–1.24)
Glucose, Bld: 118 mg/dL — ABNORMAL HIGH (ref 65–99)
POTASSIUM: 4.2 mmol/L (ref 3.5–5.1)
Sodium: 137 mmol/L (ref 135–145)
Total Bilirubin: 0.7 mg/dL (ref 0.3–1.2)
Total Protein: 5.8 g/dL — ABNORMAL LOW (ref 6.5–8.1)

## 2015-08-15 LAB — CBC WITH DIFFERENTIAL/PLATELET
Basophils Absolute: 0 10*3/uL (ref 0.0–0.1)
Basophils Relative: 0 %
Eosinophils Absolute: 0.3 10*3/uL (ref 0.0–0.7)
Eosinophils Relative: 4 %
HCT: 26.1 % — ABNORMAL LOW (ref 39.0–52.0)
Hemoglobin: 8.1 g/dL — ABNORMAL LOW (ref 13.0–17.0)
LYMPHS ABS: 1 10*3/uL (ref 0.7–4.0)
LYMPHS PCT: 16 %
MCH: 24.4 pg — AB (ref 26.0–34.0)
MCHC: 31 g/dL (ref 30.0–36.0)
MCV: 78.6 fL (ref 78.0–100.0)
Monocytes Absolute: 0.5 10*3/uL (ref 0.1–1.0)
Monocytes Relative: 8 %
NEUTROS PCT: 73 %
Neutro Abs: 4.7 10*3/uL (ref 1.7–7.7)
PLATELETS: 441 10*3/uL — AB (ref 150–400)
RBC: 3.32 MIL/uL — AB (ref 4.22–5.81)
RDW: 14.7 % (ref 11.5–15.5)
WBC: 6.5 10*3/uL (ref 4.0–10.5)

## 2015-08-15 LAB — CULTURE, ROUTINE-ABSCESS: Culture: NO GROWTH

## 2015-08-15 LAB — GLUCOSE, CAPILLARY
Glucose-Capillary: 109 mg/dL — ABNORMAL HIGH (ref 65–99)
Glucose-Capillary: 132 mg/dL — ABNORMAL HIGH (ref 65–99)
Glucose-Capillary: 74 mg/dL (ref 65–99)

## 2015-08-15 LAB — MAGNESIUM: MAGNESIUM: 1.7 mg/dL (ref 1.7–2.4)

## 2015-08-15 MED ORDER — DIAZEPAM 5 MG PO TABS
5.0000 mg | ORAL_TABLET | ORAL | Status: DC | PRN
  Administered 2015-08-15 – 2015-08-16 (×2): 5 mg via ORAL
  Filled 2015-08-15 (×2): qty 1

## 2015-08-15 MED ORDER — GABAPENTIN 300 MG PO CAPS
300.0000 mg | ORAL_CAPSULE | Freq: Three times a day (TID) | ORAL | Status: DC
  Administered 2015-08-15 – 2015-08-23 (×20): 300 mg via ORAL
  Filled 2015-08-15 (×3): qty 1
  Filled 2015-08-15: qty 3
  Filled 2015-08-15 (×17): qty 1

## 2015-08-15 MED ORDER — RIFAMPIN 300 MG PO CAPS
300.0000 mg | ORAL_CAPSULE | Freq: Once | ORAL | Status: AC
  Administered 2015-08-15: 300 mg via ORAL
  Filled 2015-08-15: qty 1

## 2015-08-15 MED ORDER — RIFAMPIN 300 MG PO CAPS
600.0000 mg | ORAL_CAPSULE | Freq: Every day | ORAL | Status: DC
  Administered 2015-08-16 – 2015-09-03 (×13): 600 mg via ORAL
  Filled 2015-08-15 (×16): qty 2

## 2015-08-15 MED ORDER — BACLOFEN 5 MG HALF TABLET
5.0000 mg | ORAL_TABLET | Freq: Once | ORAL | Status: AC
  Administered 2015-08-15: 5 mg via ORAL
  Filled 2015-08-15: qty 1

## 2015-08-15 NOTE — Progress Notes (Signed)
[facility] TEAM 1 - Stepdown/ICU TEAM Progress Note  Jeff Ferrell ZOX:[id number] DOB: 1951-01-07 DOA: 08/07/2015 PCP: [name], MD  Admit HPI / Brief Narrative: 65 y.o. BM PMHx DM Type 2  not on insulin, Diabetic Foot Ulcer, Chronic Hyponatremia, Left Toe Osteomyelitis, S/P L-spine fusion, Recent fracture Right Femoral neck with repair on 2/1   Presents because of drainage from the wound. He is noted to be tachycardic with a low-grade fever of 99 and a suspicion of a wound infection and therefore is being admitted to the hospital. Orthopedic surgery plans on taking him to the OR for drainage of the wound either today or tomorrow. He is also noted to have a UA that is positive for pyuria. He does not admit to any dysuria, suprapubic pain or increased frequency of micturition.  HPI/Subjective: 2/26 A/O 4, continued right hip pain with frequent muscle spasms.    Assessment/Plan: Positive MRSA Bacteremia/ positive MRSA Rt hip surgical wound -taken to the OR for I&D 2/19 - care per ID and Ortho - extent of additional evaluation to be delineated by ID  -TTE; normal see results below  -TEE; negative valvular vegetation -2/26 blood cultures 2/2 negative final -Ordered PICC line placement   Sepsis w/ positive MRSA Pyelonephritis & Hydronephrosis /Acute urinary retention -Place Foley for decompression -Continue current antibiotics per ID  Left chest wall Phlegmon/Abscess -2/24/ S/P IR aspiration; culture negative final   Bibasilar Pneumonia  -Continue current antibiotics  Bilateral pleural effusions -2/25 PCXR; does not show pleural effusion believe what is being read on MRI as a pleural effusion is atelectasis. -2/26 reviewed case with Dr. Annia Belt Radiology, agrees cannot see effusion on [facility] however on T-spine/L-spine MRI very small untappable recommended obtaining standard CXR.   Muscle spasms bilateral lower extremity -Increase Robaxin 1000 mg TID -Continue Valium 5  mg q 4 PRN -Increase Neurontin to 300 mg TID  DM Type 2 controlled with complications -2/21 Hemoglobin A1c= 5.7 -Continue sensitive SSI  Chronic Hyponatremia -Workup accomplished during last hospital stay concluded hypovolemia was etiology - Resolved   Hypokalemia -continue to monitor  Hypomagnesemia -Magnesium IV 3 gm  Normocytic anemia  -transfuse w/ Hgb now < 7.0 - follow up after transfusion    Code Status: FULL Family Communication: no family present at time of exam Disposition Plan: Resolution sepsis    Consultants: Dr Randall Hiss. ID Dr. Mearl Latin orthopedics Dr.Arthur Hoss IR Dr. Annia [facility] Radiology (phone consult)     Procedure/Significant Events: 2/19 - I&D infected Rt hip wound  2/22 echocardiogram;normal, negative vegetation 2/23 MRI T-spine/L-spine;-Massively distended urinary bladder, causing  2dary Hydronephrosis. -No evidence of acute thoracic or lumbar spinal infection. -Rim enhancing fluid collections in the posterior left chest wall fluid - associated with multiple displaced rib fractures - and in the right gluteal muscle fluid collection are suspicious for phlegmon/abscess - Lt >>Rt pleural effusions with bilateral lower lobe pneumonia. -Degenerative thoracic spinal stenosis Stable degenerative lumbar spinal stenosis; moderate degenerative cervical spinal stenosis  2/24 TTE;- LVEF= 60% to 65%. WMN-negative valvular thrombus - Pericardium, extracardiac: A trivial pericardial effusion  2/24Lt scapula fluid aspiration;3 cc serosanguinous fluid 2/25 PCXR; Increased interstitial markings consistent with mild edema, new in the interval. No definite pleural effusion identified    Culture 2/18 blood left hand positive staph aureus 2/18 urine positive MRSA 2/19 MRSA by PCR positive 2/19 right leg wound 2 positive MRSA 2/19 blood right AC positive MRSA 2/19 blood right arm negative final 2/20 blood  right forearm/AC negative  final 2/23 sputum pending 2/24 less scapula aspiration negative final    Antibiotics: Aztreonam 2/18>> 2/19 Ceftriaxone 2/19 >> 2/19 Rifampin 2/19 >  Vancomycin 2/18 >>   DVT prophylaxis: Lovenox   Devices Wound VAC in place right hip   LINES / TUBES:      Continuous Infusions: . sodium chloride 100 mL/hr at 08/13/15 1478    Objective: VITAL SIGNS: Temp: 97.7 F (36.5 C) (02/26 1247) Temp Source: Oral (02/26 1247) BP: 117/68 mmHg (02/26 1425) Pulse Rate: 28 (02/26 1425) SPO2; FIO2:   Intake/Output Summary (Last 24 hours) at 08/15/15 1757 Last data filed at 08/15/15 1733  Gross per 24 hour  Intake   3500 ml  Output   3200 ml  Net    300 ml     Exam: General: A/O 4, right hip pain/back pain/muscle spasm better controlled today, No acute respiratory distress Eyes: Negative headache, negative scleral hemorrhage ENT: Negative Runny nose, negative gingival bleeding, Neck:  Negative scars, masses, torticollis, lymphadenopathy, JVD Lungs: Clear to auscultation bilaterally,  without wheezes or crackles Cardiovascular: Regular rhythm and rate without murmur gallop or rub normal S1 and S2 Abdomen:negative abdominal pain, nondistended, positive soft, bowel sounds, no rebound, no ascites, no appreciable mass Back: Continued pain to palpation midline from T8-S2 Extremities: bilateral foot cyanosis, however positive DP/PT pulse, left great toe amputated healed well, significant onychomycosis all toes. Right lateral hip incision with wound VAC, draining sanguineous fluid. Hip  swollen (but significantly decreased), Psychiatric:  Negative depression, negative anxiety, negative fatigue, negative mania  Neurologic:  Cranial nerves II through XII intact, tongue/uvula midline, all extremities muscle strength 5/5, sensation intact throughout, negative dysarthria, negative expressive aphasia, negative receptive aphasia.   Data Reviewed: Basic Metabolic Panel:  Recent  Labs Lab 08/11/15 1200 08/12/15 0540 08/13/15 0518 08/14/15 0221 08/15/15 0422  NA 126* 130* 130* 130* 137  K 3.1* 3.6 3.6 3.8 4.2  CL 90* 96* 95* 98* 102  CO2 25 26 26 24 25   GLUCOSE 197* 108* 118* 129* 118*  BUN <5* <5* <5* <5* <5*  CREATININE 0.53* 0.44* 0.35* 0.49* 0.62  CALCIUM 8.3* 8.3* 8.4* 8.0* 8.3*  MG 1.2* 1.3* 1.4* 1.8 1.7   Liver Function Tests:  Recent Labs Lab 08/11/15 1200 08/12/15 0540 08/13/15 0518 08/14/15 0221 08/15/15 0422  AST 26 21 21 27 26   ALT 12* 10* 9* 13* 11*  ALKPHOS 191* 178* 177* 208* 197*  BILITOT 0.8 0.6 0.5 0.6 0.7  PROT 5.5* 5.7* 5.5* 5.9* 5.8*  ALBUMIN 2.1* 2.2* 2.2* 2.2* 2.2*   No results for input(s): LIPASE, AMYLASE in the last 168 hours. No results for input(s): AMMONIA in the last 168 hours. CBC:  Recent Labs Lab 08/11/15 2030 08/12/15 0540 08/13/15 0518 08/14/15 0221 08/15/15 0422  WBC 7.0 7.3 6.8 7.7 6.5  NEUTROABS 5.0 4.7 4.6 5.4 4.7  HGB 7.9* 7.7* 7.8* 7.9* 8.1*  HCT 23.5* 22.8* 23.7* 24.2* 26.1*  MCV 76.1* 77.6* 79.3 77.6* 78.6  PLT 364 346 388 452* 441*   Cardiac Enzymes: No results for input(s): CKTOTAL, CKMB, CKMBINDEX, TROPONINI in the last 168 hours. BNP (last 3 results)  Recent Labs  06/11/15 1106 07/02/15 0538  BNP 27.7 34.0    ProBNP (last 3 results) No results for input(s): PROBNP in the last 8760 hours.  CBG:  Recent Labs Lab 08/14/15 1105 08/14/15 1617 08/14/15 2155 08/15/15 0749 08/15/15 1246  GLUCAP 154* 128* 157* 109* 132*    Recent Results (from  the past 240 hour(s))  Blood Culture (routine x 2)     Status: None   Collection Time: 08/07/15 10:47 AM  Result Value Ref Range Status   Specimen Description LEFT ANTECUBITAL  Final   Special Requests BOTTLES DRAWN AEROBIC AND ANAEROBIC 5CC  Final   Culture  Setup Time   Final    GRAM POSITIVE COCCI IN CLUSTERS IN BOTH AEROBIC AND ANAEROBIC BOTTLES CRITICAL RESULT CALLED TO, READ BACK BY AND VERIFIED WITH: TKayren Eaves RN [phone]  GREEN R CONFIRMED BY M. CAMPBELL    Culture METHICILLIN RESISTANT STAPHYLOCOCCUS AUREUS  Final   Report Status 08/10/2015 FINAL  Final   Organism ID, Bacteria METHICILLIN RESISTANT STAPHYLOCOCCUS AUREUS  Final      Susceptibility   Methicillin resistant staphylococcus aureus - MIC*    CIPROFLOXACIN >=8 RESISTANT Resistant     ERYTHROMYCIN >=8 RESISTANT Resistant     GENTAMICIN <=0.5 SENSITIVE Sensitive     OXACILLIN >=4 RESISTANT Resistant     TETRACYCLINE <=1 SENSITIVE Sensitive     VANCOMYCIN <=0.5 SENSITIVE Sensitive     TRIMETH/SULFA >=320 RESISTANT Resistant     CLINDAMYCIN <=0.25 SENSITIVE Sensitive     RIFAMPIN <=0.5 SENSITIVE Sensitive     Inducible Clindamycin NEGATIVE Sensitive     * METHICILLIN RESISTANT STAPHYLOCOCCUS AUREUS  Wound culture     Status: None   Collection Time: 08/07/15 10:53 AM  Result Value Ref Range Status   Specimen Description WOUND RIGHT HIP  Final   Special Requests SKIN DEEP EXCISION   Final   Gram Stain   Final    NO WBC SEEN NO SQUAMOUS EPITHELIAL CELLS SEEN NO ORGANISMS SEEN Performed at [facility]    Culture   Final    ABUNDANT METHICILLIN RESISTANT STAPHYLOCOCCUS AUREUS Note: RIFAMPIN AND GENTAMICIN SHOULD NOT BE USED AS SINGLE DRUGS FOR TREATMENT OF STAPH INFECTIONS. This organism DOES NOT demonstrate inducible Clindamycin resistance in vitro. CRITICAL RESULT CALLED TO, READ BACK BY AND VERIFIED WITH: WHITNEY DAVIS @  9:10 AM 08/10/15 BY DWEEKS Performed at [facility]    Report Status 08/10/2015 FINAL  Final   Organism ID, Bacteria METHICILLIN RESISTANT STAPHYLOCOCCUS AUREUS  Final      Susceptibility   Methicillin resistant staphylococcus aureus - MIC*    CLINDAMYCIN <=0.25 SENSITIVE Sensitive     ERYTHROMYCIN >=8 RESISTANT Resistant     GENTAMICIN <=0.5 SENSITIVE Sensitive     LEVOFLOXACIN >=8 RESISTANT Resistant     OXACILLIN >=4 RESISTANT Resistant     RIFAMPIN <=0.5 SENSITIVE Sensitive     TRIMETH/SULFA  >=320 RESISTANT Resistant     VANCOMYCIN <=0.5 SENSITIVE Sensitive     TETRACYCLINE <=1 SENSITIVE Sensitive     * ABUNDANT METHICILLIN RESISTANT STAPHYLOCOCCUS AUREUS  Blood Culture (routine x 2)     Status: None   Collection Time: 08/07/15 11:18 AM  Result Value Ref Range Status   Specimen Description BLOOD LEFT HAND  Final   Special Requests BOTTLES DRAWN AEROBIC ONLY 10CC  Final   Culture  Setup Time   Final    GRAM POSITIVE COCCI IN CLUSTERS AEROBIC BOTTLE ONLY CRITICAL RESULT CALLED TO, READ BACK BY AND VERIFIED WITH: T. IRBY RN [phone] GREEN R CONFIRMED BY M. CAMPBELL    Culture   Final    STAPHYLOCOCCUS AUREUS SUSCEPTIBILITIES PERFORMED ON PREVIOUS CULTURE WITHIN THE LAST 5 DAYS.    Report Status 08/10/2015 FINAL  Final  Urine culture     Status: None  Collection Time: 08/07/15 12:23 PM  Result Value Ref Range Status   Specimen Description URINE, CATHETERIZED  Final   Special Requests NONE  Final   Culture   Final    >=100,000 COLONIES/mL METHICILLIN RESISTANT STAPHYLOCOCCUS AUREUS   Report Status 08/09/2015 FINAL  Final   Organism ID, Bacteria METHICILLIN RESISTANT STAPHYLOCOCCUS AUREUS  Final      Susceptibility   Methicillin resistant staphylococcus aureus - MIC*    CIPROFLOXACIN >=8 RESISTANT Resistant     GENTAMICIN <=0.5 SENSITIVE Sensitive     NITROFURANTOIN <=16 SENSITIVE Sensitive     OXACILLIN >=4 RESISTANT Resistant     TETRACYCLINE <=1 SENSITIVE Sensitive     VANCOMYCIN 1 SENSITIVE Sensitive     TRIMETH/SULFA >=320 RESISTANT Resistant     CLINDAMYCIN <=0.25 SENSITIVE Sensitive     RIFAMPIN <=0.5 SENSITIVE Sensitive     Inducible Clindamycin NEGATIVE Sensitive     * >=100,000 COLONIES/mL METHICILLIN RESISTANT STAPHYLOCOCCUS AUREUS  MRSA PCR Screening     Status: Abnormal   Collection Time: 08/08/15  5:32 AM  Result Value Ref Range Status   MRSA by PCR POSITIVE (A) NEGATIVE Final    Comment:        The GeneXpert MRSA Assay (FDA approved for NASAL  specimens only), is one component of a comprehensive MRSA colonization surveillance program. It is not intended to diagnose MRSA infection nor to guide or monitor treatment for MRSA infections. RESULT CALLED TO, READ BACK BY AND VERIFIED WITH: RN GORDON,R AT [id number] MARTINB   Anaerobic culture     Status: None   Collection Time: 08/08/15  8:28 AM  Result Value Ref Range Status   Specimen Description WOUND RIGHT LEG  Final   Special Requests POF VANCOMYCIN LOWER RIGHT LEG INCISION PART A   Final   Gram Stain   Final    ABUNDANT WBC PRESENT, PREDOMINANTLY PMN NO SQUAMOUS EPITHELIAL CELLS SEEN FEW GRAM POSITIVE COCCI IN CLUSTERS IN PAIRS Performed at [facility]    Culture   Final    NO ANAEROBES ISOLATED Performed at [facility]    Report Status 08/13/2015 FINAL  Final  Wound culture     Status: None   Collection Time: 08/08/15  8:28 AM  Result Value Ref Range Status   Specimen Description WOUND RIGHT LEG  Final   Special Requests POF VANCOMYCIN RIGHT LOWER LEG INCISION PART A  Final   Gram Stain   Final    FEW WBC PRESENT, PREDOMINANTLY PMN NO SQUAMOUS EPITHELIAL CELLS SEEN RARE GRAM POSITIVE COCCI IN PAIRS Performed at [facility]    Culture   Final    MODERATE METHICILLIN RESISTANT STAPHYLOCOCCUS AUREUS Note: RIFAMPIN AND GENTAMICIN SHOULD NOT BE USED AS SINGLE DRUGS FOR TREATMENT OF STAPH INFECTIONS. This organism DOES NOT demonstrate inducible Clindamycin resistance in vitro. CRITICAL RESULT CALLED TO, READ BACK BY AND VERIFIED WITH: CENLINA S 2/22 @  0920 BY REAMM Performed at [facility]    Report Status 08/11/2015 FINAL  Final   Organism ID, Bacteria METHICILLIN RESISTANT STAPHYLOCOCCUS AUREUS  Final      Susceptibility   Methicillin resistant staphylococcus aureus - MIC*    CLINDAMYCIN <=0.25 SENSITIVE Sensitive     ERYTHROMYCIN >=8 RESISTANT Resistant     GENTAMICIN <=0.5 SENSITIVE Sensitive     LEVOFLOXACIN >=8  RESISTANT Resistant     OXACILLIN >=4 RESISTANT Resistant     RIFAMPIN <=0.5 SENSITIVE Sensitive     TRIMETH/SULFA >=320 RESISTANT Resistant  VANCOMYCIN 1 SENSITIVE Sensitive     TETRACYCLINE <=1 SENSITIVE Sensitive     * MODERATE METHICILLIN RESISTANT STAPHYLOCOCCUS AUREUS  Anaerobic culture     Status: None   Collection Time: 08/08/15  8:49 AM  Result Value Ref Range Status   Specimen Description WOUND RIGHT LEG  Final   Special Requests POF VANCOMYCIN PROXIMAL RIGHT LOWER LEG PART B  Final   Gram Stain   Final    ABUNDANT WBC PRESENT, PREDOMINANTLY PMN NO SQUAMOUS EPITHELIAL CELLS SEEN FEW GRAM POSITIVE COCCI IN PAIRS IN CLUSTERS Performed at [facility]    Culture   Final    NO ANAEROBES ISOLATED Performed at [facility]    Report Status 08/13/2015 FINAL  Final  Wound culture     Status: None   Collection Time: 08/08/15  8:49 AM  Result Value Ref Range Status   Specimen Description WOUND RIGHT LEG  Final   Special Requests POF VANCOMYCIN PROXIMAL RIGHT LEG INCISON PART B  Final   Gram Stain   Final    FEW WBC PRESENT, PREDOMINANTLY PMN NO SQUAMOUS EPITHELIAL CELLS SEEN FEW GRAM POSITIVE COCCI IN PAIRS IN CLUSTERS Performed at [facility]    Culture   Final    MODERATE METHICILLIN RESISTANT STAPHYLOCOCCUS AUREUS Note: RIFAMPIN AND GENTAMICIN SHOULD NOT BE USED AS SINGLE DRUGS FOR TREATMENT OF STAPH INFECTIONS. This organism DOES NOT demonstrate inducible Clindamycin resistance in vitro. CRITICAL RESULT CALLED TO, READ BACK BY AND VERIFIED WITH: CENLINA S 2/22 @  0920 BY REAMM Performed at [facility]    Report Status 08/11/2015 FINAL  Final   Organism ID, Bacteria METHICILLIN RESISTANT STAPHYLOCOCCUS AUREUS  Final      Susceptibility   Methicillin resistant staphylococcus aureus - MIC*    CLINDAMYCIN <=0.25 SENSITIVE Sensitive     ERYTHROMYCIN >=8 RESISTANT Resistant     GENTAMICIN <=0.5 SENSITIVE Sensitive     LEVOFLOXACIN  >=8 RESISTANT Resistant     OXACILLIN >=4 RESISTANT Resistant     RIFAMPIN <=0.5 SENSITIVE Sensitive     TRIMETH/SULFA >=320 RESISTANT Resistant     VANCOMYCIN 1 SENSITIVE Sensitive     TETRACYCLINE <=1 SENSITIVE Sensitive     * MODERATE METHICILLIN RESISTANT STAPHYLOCOCCUS AUREUS  Culture, blood (Routine X 2) w Reflex to ID Panel     Status: None   Collection Time: 08/08/15 12:40 PM  Result Value Ref Range Status   Specimen Description BLOOD RIGHT ANTECUBITAL  Final   Special Requests IN PEDIATRIC BOTTLE 2CC  Final   Culture  Setup Time   Final    GRAM POSITIVE COCCI IN CLUSTERS PEDIATRIC BOTTLE CRITICAL RESULT CALLED TO, READ BACK BY AND VERIFIED WITH: W DAVIS,RN AT 1043 08/09/15 BY L BENFIELD    Culture METHICILLIN RESISTANT STAPHYLOCOCCUS AUREUS  Final   Report Status 08/11/2015 FINAL  Final   Organism ID, Bacteria METHICILLIN RESISTANT STAPHYLOCOCCUS AUREUS  Final      Susceptibility   Methicillin resistant staphylococcus aureus - MIC*    CIPROFLOXACIN >=8 RESISTANT Resistant     ERYTHROMYCIN >=8 RESISTANT Resistant     GENTAMICIN <=0.5 SENSITIVE Sensitive     OXACILLIN >=4 RESISTANT Resistant     TETRACYCLINE <=1 SENSITIVE Sensitive     VANCOMYCIN 1 SENSITIVE Sensitive     TRIMETH/SULFA >=320 RESISTANT Resistant     CLINDAMYCIN <=0.25 SENSITIVE Sensitive     RIFAMPIN <=0.5 SENSITIVE Sensitive     Inducible Clindamycin NEGATIVE Sensitive     *  METHICILLIN RESISTANT STAPHYLOCOCCUS AUREUS  Culture, blood (Routine X 2) w Reflex to ID Panel     Status: None   Collection Time: 08/08/15 12:45 PM  Result Value Ref Range Status   Specimen Description BLOOD RIGHT ARM  Final   Special Requests BOTTLES DRAWN AEROBIC ONLY  5CC  Final   Culture NO GROWTH 5 DAYS  Final   Report Status 08/13/2015 FINAL  Final  Culture, blood (Routine X 2) w Reflex to ID Panel     Status: None   Collection Time: 08/09/15 12:09 PM  Result Value Ref Range Status   Specimen Description BLOOD BLOOD RIGHT  FOREARM  Final   Special Requests IN PEDIATRIC BOTTLE .3CC  Final   Culture NO GROWTH 5 DAYS  Final   Report Status 08/14/2015 FINAL  Final  Culture, blood (Routine X 2) w Reflex to ID Panel     Status: None   Collection Time: 08/09/15 12:15 PM  Result Value Ref Range Status   Specimen Description BLOOD RIGHT ANTECUBITAL  Final   Special Requests BOTTLES DRAWN AEROBIC ONLY 6CC  Final   Culture NO GROWTH 5 DAYS  Final   Report Status 08/14/2015 FINAL  Final  Culture, routine-abscess     Status: None   Collection Time: 08/13/15 12:16 PM  Result Value Ref Range Status   Specimen Description OTHER  Final   Special Requests LEFT SCAPULA  Final   Gram Stain   Final    RARE WBC NO SQUAMOUS EPITHELIAL CELLS SEEN NO ORGANISMS SEEN Performed at [facility]    Culture   Final    NO GROWTH 2 DAYS Performed at [facility]    Report Status 08/15/2015 FINAL  Final     Studies:  Recent x-ray studies have been reviewed in detail by the Attending Physician  Scheduled Meds:  Scheduled Meds: . sodium chloride   Intravenous Once  . aspirin EC  325 mg Oral Daily  . docusate sodium  100 mg Oral BID  . feeding supplement (ENSURE ENLIVE)  237 mL Oral TID BM  . gabapentin  300 mg Oral TID  . insulin aspart  0-5 Units Subcutaneous QHS  . insulin aspart  0-9 Units Subcutaneous TID WC  . methocarbamol  1,000 mg Oral QID  . [START ON 08/16/2015] rifampin  600 mg Oral Daily  . tamsulosin  0.4 mg Oral Daily  . vancomycin  1,500 mg Intravenous Q12H    Time spent on care of this patient: 40 mins   WOODS, Roselind Messier , MD  [facility] Office  [phone] Pager [phone]  On-Call/Text Page:      [email or web address]      password [email or web address]  If 7PM-7AM, please contact night-coverage [email or web address] Password [email or web address] 08/15/2015, 5:57 PM   LOS: 8 days   Care during the described time interval was provided by me .  I have reviewed this patient's available data, including medical  history, events of note, physical examination, and all test results as part of my evaluation. I have personally reviewed and interpreted all radiology studies.   Carolyne Littles, MD [phone] Pager

## 2015-08-15 NOTE — Progress Notes (Signed)
Pt removed wound vac tubing from dressing. Pt stated He rolled over and it detached from dressing.  RN replaced tube with new tubing and restarted wound vac therapy per previous orders. Will continue to monitor pt.

## 2015-08-16 ENCOUNTER — Encounter (HOSPITAL_COMMUNITY): Payer: Self-pay | Admitting: Cardiovascular Disease

## 2015-08-16 ENCOUNTER — Inpatient Hospital Stay (HOSPITAL_COMMUNITY): Payer: Medicare Other

## 2015-08-16 DIAGNOSIS — E13621 Other specified diabetes mellitus with foot ulcer: Secondary | ICD-10-CM

## 2015-08-16 DIAGNOSIS — L97509 Non-pressure chronic ulcer of other part of unspecified foot with unspecified severity: Secondary | ICD-10-CM

## 2015-08-16 DIAGNOSIS — E11621 Type 2 diabetes mellitus with foot ulcer: Secondary | ICD-10-CM | POA: Diagnosis present

## 2015-08-16 DIAGNOSIS — S72001F Fracture of unspecified part of neck of right femur, subsequent encounter for open fracture type IIIA, IIIB, or IIIC with routine healing: Secondary | ICD-10-CM

## 2015-08-16 LAB — GLUCOSE, CAPILLARY
GLUCOSE-CAPILLARY: 86 mg/dL (ref 65–99)
GLUCOSE-CAPILLARY: 115 mg/dL — AB (ref 65–99)
Glucose-Capillary: 93 mg/dL (ref 65–99)
Glucose-Capillary: 92 mg/dL (ref 65–99)
Glucose-Capillary: 167 mg/dL — ABNORMAL HIGH (ref 65–99)

## 2015-08-16 LAB — CBC WITH DIFFERENTIAL/PLATELET
BASOS ABS: 0 10*3/uL (ref 0.0–0.1)
Basophils Relative: 0 %
EOS PCT: 4 %
Eosinophils Absolute: 0.3 10*3/uL (ref 0.0–0.7)
HEMATOCRIT: 27 % — AB (ref 39.0–52.0)
HEMOGLOBIN: 8.3 g/dL — AB (ref 13.0–17.0)
LYMPHS ABS: 1.3 10*3/uL (ref 0.7–4.0)
LYMPHS PCT: 17 %
MCH: 24.4 pg — AB (ref 26.0–34.0)
MCHC: 30.7 g/dL (ref 30.0–36.0)
MCV: 79.4 fL (ref 78.0–100.0)
Monocytes Absolute: 0.5 10*3/uL (ref 0.1–1.0)
Monocytes Relative: 7 %
NEUTROS ABS: 5.4 10*3/uL (ref 1.7–7.7)
NEUTROS PCT: 72 %
PLATELETS: 564 10*3/uL — AB (ref 150–400)
RBC: 3.4 MIL/uL — AB (ref 4.22–5.81)
RDW: 15 % (ref 11.5–15.5)
WBC: 7.5 10*3/uL (ref 4.0–10.5)

## 2015-08-16 LAB — COMPREHENSIVE METABOLIC PANEL
ALT: 11 U/L — ABNORMAL LOW (ref 17–63)
ANION GAP: 9 (ref 5–15)
AST: 20 U/L (ref 15–41)
Albumin: 2.3 g/dL — ABNORMAL LOW (ref 3.5–5.0)
Alkaline Phosphatase: 205 U/L — ABNORMAL HIGH (ref 38–126)
BILIRUBIN TOTAL: 0.7 mg/dL (ref 0.3–1.2)
CHLORIDE: 99 mmol/L — AB (ref 101–111)
CO2: 26 mmol/L (ref 22–32)
Calcium: 8.5 mg/dL — ABNORMAL LOW (ref 8.9–10.3)
Creatinine, Ser: 0.53 mg/dL — ABNORMAL LOW (ref 0.61–1.24)
GFR calc Af Amer: >60 mL/min (ref >60)
Glucose, Bld: 111 mg/dL — ABNORMAL HIGH (ref 65–99)
POTASSIUM: 3.8 mmol/L (ref 3.5–5.1)
Sodium: 134 mmol/L — ABNORMAL LOW (ref 135–145)
TOTAL PROTEIN: 6.3 g/dL — AB (ref 6.5–8.1)

## 2015-08-16 LAB — MAGNESIUM: Magnesium: 1.6 mg/dL — ABNORMAL LOW (ref 1.7–2.4)

## 2015-08-16 MED ORDER — DIAZEPAM 5 MG PO TABS
5.0000 mg | ORAL_TABLET | Freq: Four times a day (QID) | ORAL | Status: DC
  Administered 2015-08-16 – 2015-08-21 (×20): 5 mg via ORAL
  Filled 2015-08-16 (×20): qty 1

## 2015-08-16 MED ORDER — LIDOCAINE HCL 1 % IJ SOLN
INTRAMUSCULAR | Status: AC
  Filled 2015-08-16: qty 20

## 2015-08-16 MED ORDER — MAGNESIUM SULFATE 2 GM/50ML IV SOLN
2.0000 g | Freq: Once | INTRAVENOUS | Status: AC
  Administered 2015-08-16: 2 g via INTRAVENOUS
  Filled 2015-08-16: qty 50

## 2015-08-16 MED ORDER — IOHEXOL 300 MG/ML  SOLN
50.0000 mL | Freq: Once | INTRAMUSCULAR | Status: AC | PRN
  Administered 2015-08-16: 10 mL via INTRAVENOUS

## 2015-08-16 MED ORDER — METAXALONE 800 MG PO TABS
800.0000 mg | ORAL_TABLET | Freq: Four times a day (QID) | ORAL | Status: DC
  Administered 2015-08-16 (×2): 800 mg via ORAL
  Filled 2015-08-16 (×3): qty 1

## 2015-08-16 NOTE — Progress Notes (Signed)
Attempted PICC insertion via right basilic and brachial veins without success.  Unable to thread beyond the clavicle.  Referred to IR.  Spoke with [place] and gave her the information.    Primary RN also made aware.

## 2015-08-16 NOTE — Progress Notes (Signed)
Left heel with small purple area (susptected deep tissue injury), skin intact.  Allevyn heel dressings applied bilaterally and legs elevated to the greatest extent to prevent pressure to heels.

## 2015-08-16 NOTE — Progress Notes (Signed)
PT Cancellation Note  Patient Details Name: Jeff Ferrell MRN: [id number] DOB: 01/28/1951   Cancelled Treatment:    Reason Eval/Treat Not Completed: Patient at procedure or test/unavailable.  Pt currently off floor for PICC line placement.  PT will continue to follow acutely.  Michail Jewels PT, DPT [phone] Pager: [phone] 08/16/2015, 1:14 PM

## 2015-08-16 NOTE — Procedures (Signed)
Successful IMA angio with peripheral 2mm microcoil embo of a small branch of the superior hemorrhoidal artery. Transient acute bleed seen in the distal sigmoid colon  No comp Stable EBL 0 Full report in PACS

## 2015-08-16 NOTE — Progress Notes (Signed)
Peripherally Inserted Central Catheter/Midline Placement  The IV Nurse has discussed with the patient and/or persons authorized to consent for the patient, the purpose of this procedure and the potential benefits and risks involved with this procedure.  The benefits include less needle sticks, lab draws from the catheter and patient may be discharged home with the catheter.  Risks include, but not limited to, infection, bleeding, blood clot (thrombus formation), and puncture of an artery; nerve damage and irregular heat beat.  Alternatives to this procedure were also discussed.  PICC/Midline Placement Documentation        Timmothy Sours 08/16/2015, 8:57 AM

## 2015-08-16 NOTE — Progress Notes (Signed)
Subjective:  C.o hip pain and pain in buttocks with sitting for 15 minutes  Antibiotics:  Anti-infectives    Start     Dose/Rate Route Frequency Ordered Stop   08/16/15 1000  rifampin (RIFADIN) capsule 600 mg     600 mg Oral Daily 08/15/15 1009     08/15/15 1200  rifampin (RIFADIN) capsule 300 mg     300 mg Oral  Once 08/15/15 1009 08/15/15 1324   08/15/15 0600  vancomycin (VANCOCIN) 1,500 mg in sodium chloride 0.9 % 500 mL IVPB  Status:  Discontinued     1,500 mg 250 mL/hr over 120 Minutes Intravenous Every 12 hours 08/14/15 1531 08/14/15 2015   08/14/15 2015  vancomycin (VANCOCIN) 1,500 mg in sodium chloride 0.9 % 500 mL IVPB     1,500 mg 250 mL/hr over 120 Minutes Intravenous Every 12 hours 08/14/15 2015     08/12/15 0200  vancomycin (VANCOCIN) 1,250 mg in sodium chloride 0.9 % 250 mL IVPB  Status:  Discontinued     1,250 mg 166.7 mL/hr over 90 Minutes Intravenous Every 8 hours 08/11/15 1820 08/14/15 1531   08/10/15 1000  vancomycin (VANCOCIN) IVPB 1000 mg/200 mL premix  Status:  Discontinued     1,000 mg 200 mL/hr over 60 Minutes Intravenous Every 8 hours 08/10/15 0634 08/11/15 1820   08/09/15 1200  cefTRIAXone (ROCEPHIN) 2 g in dextrose 5 % 50 mL IVPB  Status:  Discontinued     2 g 100 mL/hr over 30 Minutes Intravenous Every 24 hours 08/09/15 1006 08/09/15 1140   08/08/15 1200  cefTRIAXone (ROCEPHIN) 1 g in dextrose 5 % 50 mL IVPB  Status:  Discontinued     1 g 100 mL/hr over 30 Minutes Intravenous Every 24 hours 08/08/15 1015 08/09/15 1006   08/08/15 1200  rifampin (RIFADIN) capsule 300 mg  Status:  Discontinued     300 mg Oral Daily 08/08/15 1015 08/15/15 1009   08/08/15 0841  vancomycin (VANCOCIN) powder  Status:  Discontinued       As needed 08/08/15 0841 08/08/15 0938   08/08/15 0840  gentamicin (GARAMYCIN) injection  Status:  Discontinued       As needed 08/08/15 0841 08/08/15 0938   08/08/15 0500  vancomycin (VANCOCIN) IVPB 1000 mg/200 mL premix  Status:   Discontinued     1,000 mg 200 mL/hr over 60 Minutes Intravenous Every 12 hours 08/07/15 1552 08/10/15 0634   08/08/15 0100  vancomycin (VANCOCIN) IVPB 1000 mg/200 mL premix  Status:  Discontinued     1,000 mg 200 mL/hr over 60 Minutes Intravenous Every 12 hours 08/07/15 1219 08/07/15 1552   08/07/15 2000  [MAR Hold]  aztreonam (AZACTAM) 2 g in dextrose 5 % 50 mL IVPB  Status:  Discontinued     (MAR Hold since 08/08/15 0752)   2 g 100 mL/hr over 30 Minutes Intravenous 3 times per day 08/07/15 1219 08/08/15 1015   08/07/15 1600  vancomycin (VANCOCIN) 1,500 mg in sodium chloride 0.9 % 500 mL IVPB     1,500 mg 250 mL/hr over 120 Minutes Intravenous  Once 08/07/15 1551 08/07/15 1858   08/07/15 1130  vancomycin (VANCOCIN) 1,500 mg in sodium chloride 0.9 % 500 mL IVPB     1,500 mg 250 mL/hr over 120 Minutes Intravenous  Once 08/07/15 1054 08/07/15 1615   08/07/15 1100  aztreonam (AZACTAM) 2 g in dextrose 5 % 50 mL IVPB     2 g 100 mL/hr  over 30 Minutes Intravenous  Once 08/07/15 1054 08/07/15 1248      Medications: Scheduled Meds: . sodium chloride   Intravenous Once  . aspirin EC  325 mg Oral Daily  . diazepam  5 mg Oral QID  . docusate sodium  100 mg Oral BID  . feeding supplement (ENSURE ENLIVE)  237 mL Oral TID BM  . gabapentin  300 mg Oral TID  . insulin aspart  0-5 Units Subcutaneous QHS  . insulin aspart  0-9 Units Subcutaneous TID WC  . lidocaine      . metaxalone  800 mg Oral QID  . rifampin  600 mg Oral Daily  . tamsulosin  0.4 mg Oral Daily  . vancomycin  1,500 mg Intravenous Q12H   Continuous Infusions: . sodium chloride 100 mL/hr (08/16/15 1552)   PRN Meds:.acetaminophen **OR** acetaminophen, alum & mag hydroxide-simeth, flumazenil, metoCLOPramide **OR** metoCLOPramide (REGLAN) injection, morphine injection, naLOXone (NARCAN)  injection, ondansetron **OR** ondansetron (ZOFRAN) IV, oxyCODONE    Objective: Weight change:   Intake/Output Summary (Last 24 hours) at  08/16/15 1904 Last data filed at 08/16/15 1900  Gross per 24 hour  Intake   2450 ml  Output   5551 ml  Net  -3101 ml   Blood pressure 119/63, pulse 93, temperature 98.3 F (36.8 C), temperature source Oral, resp. rate 22, height  (1.753 m), weight 161 lb 2.5 oz (73.1 kg), SpO2 99 %. Temp:  [97.7 F (36.5 C)-98.8 F (37.1 C)] 98.3 F (36.8 C) (02/27 1501) Pulse Rate:  [91-104] 93 (02/27 1530) Resp:  [15-24] 22 (02/27 1530) BP: (105-128)/(59-80) 119/63 mmHg (02/27 1530) SpO2:  [94 %-100 %] 99 % (02/27 1530)  Physical Exam: General: Alert and awake, oriented person and place he does not recall IR procedure yesterday.  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, feet with amputation sites  Neuro: nonfocal  CBC:  CBC Latest Ref Rng 08/16/2015 08/15/2015 08/14/2015  WBC 4.0 - 10.5 K/uL 7.5 6.5 7.7  Hemoglobin 13.0 - 17.0 g/dL 8.3(L) 8.1(L) 7.9(L)  Hematocrit 39.0 - 52.0 % 27.0(L) 26.1(L) 24.2(L)  Platelets 150 - 400 K/uL 564(H) 441(H) 452(H)       BMET  Recent Labs  08/15/15 0422 08/16/15 0312  NA 137 134*  K 4.2 3.8  CL 102 99*  CO2 25 26  GLUCOSE 118* 111*  BUN <5* <5*  CREATININE 0.62 0.53*  CALCIUM 8.3* 8.5*     Liver Panel   Recent Labs  08/15/15 0422 08/16/15 0312  PROT 5.8* 6.3*  ALBUMIN 2.2* 2.3*  AST 26 20  ALT 11* 11*  ALKPHOS 197* 205*  BILITOT 0.7 0.7       Sedimentation Rate No results for input(s): ESRSEDRATE in the last 72 hours. C-Reactive Protein No results for input(s): CRP in the last 72 hours.  Micro Results: Recent Results (from the past 720 hour(s))  Surgical pcr screen     Status: Abnormal   Collection Time: 07/19/15  8:55 AM  Result Value Ref Range Status   MRSA, PCR POSITIVE (A) NEGATIVE Final    Comment: RESULT CALLED TO, READ BACK BY AND VERIFIED WITH: AMY FARGO,RN [id number] @ 1417 BY J SCOTTON    Staphylococcus aureus POSITIVE (A) NEGATIVE Final    Comment:        The Xpert SA Assay  (FDA approved for NASAL specimens in patients over 74 years of age), is one component of a comprehensive surveillance program.  Test performance  has been validated by [facility] for patients greater than or equal to 44 year old. It is not intended to diagnose infection nor to guide or monitor treatment. RESULT CALLED TO, READ BACK BY AND VERIFIED WITH: AMY FARGO,RN [id number] @ 1417 BY J SCOTTON   Blood Culture (routine x 2)     Status: None   Collection Time: 08/07/15 10:47 AM  Result Value Ref Range Status   Specimen Description LEFT ANTECUBITAL  Final   Special Requests BOTTLES DRAWN AEROBIC AND ANAEROBIC 5CC  Final   Culture  Setup Time   Final    GRAM POSITIVE COCCI IN CLUSTERS IN BOTH AEROBIC AND ANAEROBIC BOTTLES CRITICAL RESULT CALLED TO, READ BACK BY AND VERIFIED WITH: TKayren Eaves RN [phone] GREEN R CONFIRMED BY M. CAMPBELL    Culture METHICILLIN RESISTANT STAPHYLOCOCCUS AUREUS  Final   Report Status 08/10/2015 FINAL  Final   Organism ID, Bacteria METHICILLIN RESISTANT STAPHYLOCOCCUS AUREUS  Final      Susceptibility   Methicillin resistant staphylococcus aureus - MIC*    CIPROFLOXACIN >=8 RESISTANT Resistant     ERYTHROMYCIN >=8 RESISTANT Resistant     GENTAMICIN <=0.5 SENSITIVE Sensitive     OXACILLIN >=4 RESISTANT Resistant     TETRACYCLINE <=1 SENSITIVE Sensitive     VANCOMYCIN <=0.5 SENSITIVE Sensitive     TRIMETH/SULFA >=320 RESISTANT Resistant     CLINDAMYCIN <=0.25 SENSITIVE Sensitive     RIFAMPIN <=0.5 SENSITIVE Sensitive     Inducible Clindamycin NEGATIVE Sensitive     * METHICILLIN RESISTANT STAPHYLOCOCCUS AUREUS  Wound culture     Status: None   Collection Time: 08/07/15 10:53 AM  Result Value Ref Range Status   Specimen Description WOUND RIGHT HIP  Final   Special Requests SKIN DEEP EXCISION   Final   Gram Stain   Final    NO WBC SEEN NO SQUAMOUS EPITHELIAL CELLS SEEN NO ORGANISMS SEEN Performed at [facility]    Culture   Final     ABUNDANT METHICILLIN RESISTANT STAPHYLOCOCCUS AUREUS Note: RIFAMPIN AND GENTAMICIN SHOULD NOT BE USED AS SINGLE DRUGS FOR TREATMENT OF STAPH INFECTIONS. This organism DOES NOT demonstrate inducible Clindamycin resistance in vitro. CRITICAL RESULT CALLED TO, READ BACK BY AND VERIFIED WITH: WHITNEY DAVIS @  9:10 AM 08/10/15 BY DWEEKS Performed at [facility]    Report Status 08/10/2015 FINAL  Final   Organism ID, Bacteria METHICILLIN RESISTANT STAPHYLOCOCCUS AUREUS  Final      Susceptibility   Methicillin resistant staphylococcus aureus - MIC*    CLINDAMYCIN <=0.25 SENSITIVE Sensitive     ERYTHROMYCIN >=8 RESISTANT Resistant     GENTAMICIN <=0.5 SENSITIVE Sensitive     LEVOFLOXACIN >=8 RESISTANT Resistant     OXACILLIN >=4 RESISTANT Resistant     RIFAMPIN <=0.5 SENSITIVE Sensitive     TRIMETH/SULFA >=320 RESISTANT Resistant     VANCOMYCIN <=0.5 SENSITIVE Sensitive     TETRACYCLINE <=1 SENSITIVE Sensitive     * ABUNDANT METHICILLIN RESISTANT STAPHYLOCOCCUS AUREUS  Blood Culture (routine x 2)     Status: None   Collection Time: 08/07/15 11:18 AM  Result Value Ref Range Status   Specimen Description BLOOD LEFT HAND  Final   Special Requests BOTTLES DRAWN AEROBIC ONLY 10CC  Final   Culture  Setup Time   Final    GRAM POSITIVE COCCI IN CLUSTERS AEROBIC BOTTLE ONLY CRITICAL RESULT CALLED TO, READ BACK BY AND VERIFIED WITH: Lin Landsman RN [phone] GREEN R CONFIRMED BY M.  CAMPBELL    Culture   Final    STAPHYLOCOCCUS AUREUS SUSCEPTIBILITIES PERFORMED ON PREVIOUS CULTURE WITHIN THE LAST 5 DAYS.    Report Status 08/10/2015 FINAL  Final  Urine culture     Status: None   Collection Time: 08/07/15 12:23 PM  Result Value Ref Range Status   Specimen Description URINE, CATHETERIZED  Final   Special Requests NONE  Final   Culture   Final    >=100,000 COLONIES/mL METHICILLIN RESISTANT STAPHYLOCOCCUS AUREUS   Report Status 08/09/2015 FINAL  Final   Organism ID, Bacteria METHICILLIN  RESISTANT STAPHYLOCOCCUS AUREUS  Final      Susceptibility   Methicillin resistant staphylococcus aureus - MIC*    CIPROFLOXACIN >=8 RESISTANT Resistant     GENTAMICIN <=0.5 SENSITIVE Sensitive     NITROFURANTOIN <=16 SENSITIVE Sensitive     OXACILLIN >=4 RESISTANT Resistant     TETRACYCLINE <=1 SENSITIVE Sensitive     VANCOMYCIN 1 SENSITIVE Sensitive     TRIMETH/SULFA >=320 RESISTANT Resistant     CLINDAMYCIN <=0.25 SENSITIVE Sensitive     RIFAMPIN <=0.5 SENSITIVE Sensitive     Inducible Clindamycin NEGATIVE Sensitive     * >=100,000 COLONIES/mL METHICILLIN RESISTANT STAPHYLOCOCCUS AUREUS  MRSA PCR Screening     Status: Abnormal   Collection Time: 08/08/15  5:32 AM  Result Value Ref Range Status   MRSA by PCR POSITIVE (A) NEGATIVE Final    Comment:        The GeneXpert MRSA Assay (FDA approved for NASAL specimens only), is one component of a comprehensive MRSA colonization surveillance program. It is not intended to diagnose MRSA infection nor to guide or monitor treatment for MRSA infections. RESULT CALLED TO, READ BACK BY AND VERIFIED WITH: RN GORDON,R AT 1002 [id number] MARTINB   Anaerobic culture     Status: None   Collection Time: 08/08/15  8:28 AM  Result Value Ref Range Status   Specimen Description WOUND RIGHT LEG  Final   Special Requests POF VANCOMYCIN LOWER RIGHT LEG INCISION PART A   Final   Gram Stain   Final    ABUNDANT WBC PRESENT, PREDOMINANTLY PMN NO SQUAMOUS EPITHELIAL CELLS SEEN FEW GRAM POSITIVE COCCI IN CLUSTERS IN PAIRS Performed at [facility]    Culture   Final    NO ANAEROBES ISOLATED Performed at [facility]    Report Status 08/13/2015 FINAL  Final  Wound culture     Status: None   Collection Time: 08/08/15  8:28 AM  Result Value Ref Range Status   Specimen Description WOUND RIGHT LEG  Final   Special Requests POF VANCOMYCIN RIGHT LOWER LEG INCISION PART A  Final   Gram Stain   Final    FEW WBC PRESENT, PREDOMINANTLY  PMN NO SQUAMOUS EPITHELIAL CELLS SEEN RARE GRAM POSITIVE COCCI IN PAIRS Performed at [facility]    Culture   Final    MODERATE METHICILLIN RESISTANT STAPHYLOCOCCUS AUREUS Note: RIFAMPIN AND GENTAMICIN SHOULD NOT BE USED AS SINGLE DRUGS FOR TREATMENT OF STAPH INFECTIONS. This organism DOES NOT demonstrate inducible Clindamycin resistance in vitro. CRITICAL RESULT CALLED TO, READ BACK BY AND VERIFIED WITH: CENLINA S 2/22 @  0920 BY REAMM Performed at [facility]    Report Status 08/11/2015 FINAL  Final   Organism ID, Bacteria METHICILLIN RESISTANT STAPHYLOCOCCUS AUREUS  Final      Susceptibility   Methicillin resistant staphylococcus aureus - MIC*    CLINDAMYCIN <=0.25 SENSITIVE Sensitive     ERYTHROMYCIN >=  8 RESISTANT Resistant     GENTAMICIN <=0.5 SENSITIVE Sensitive     LEVOFLOXACIN >=8 RESISTANT Resistant     OXACILLIN >=4 RESISTANT Resistant     RIFAMPIN <=0.5 SENSITIVE Sensitive     TRIMETH/SULFA >=320 RESISTANT Resistant     VANCOMYCIN 1 SENSITIVE Sensitive     TETRACYCLINE <=1 SENSITIVE Sensitive     * MODERATE METHICILLIN RESISTANT STAPHYLOCOCCUS AUREUS  Anaerobic culture     Status: None   Collection Time: 08/08/15  8:49 AM  Result Value Ref Range Status   Specimen Description WOUND RIGHT LEG  Final   Special Requests POF VANCOMYCIN PROXIMAL RIGHT LOWER LEG PART B  Final   Gram Stain   Final    ABUNDANT WBC PRESENT, PREDOMINANTLY PMN NO SQUAMOUS EPITHELIAL CELLS SEEN FEW GRAM POSITIVE COCCI IN PAIRS IN CLUSTERS Performed at [facility]    Culture   Final    NO ANAEROBES ISOLATED Performed at [facility]    Report Status 08/13/2015 FINAL  Final  Wound culture     Status: None   Collection Time: 08/08/15  8:49 AM  Result Value Ref Range Status   Specimen Description WOUND RIGHT LEG  Final   Special Requests POF VANCOMYCIN PROXIMAL RIGHT LEG INCISON PART B  Final   Gram Stain   Final    FEW WBC PRESENT, PREDOMINANTLY PMN NO  SQUAMOUS EPITHELIAL CELLS SEEN FEW GRAM POSITIVE COCCI IN PAIRS IN CLUSTERS Performed at [facility]    Culture   Final    MODERATE METHICILLIN RESISTANT STAPHYLOCOCCUS AUREUS Note: RIFAMPIN AND GENTAMICIN SHOULD NOT BE USED AS SINGLE DRUGS FOR TREATMENT OF STAPH INFECTIONS. This organism DOES NOT demonstrate inducible Clindamycin resistance in vitro. CRITICAL RESULT CALLED TO, READ BACK BY AND VERIFIED WITH: CENLINA S 2/22 @  0920 BY REAMM Performed at [facility]    Report Status 08/11/2015 FINAL  Final   Organism ID, Bacteria METHICILLIN RESISTANT STAPHYLOCOCCUS AUREUS  Final      Susceptibility   Methicillin resistant staphylococcus aureus - MIC*    CLINDAMYCIN <=0.25 SENSITIVE Sensitive     ERYTHROMYCIN >=8 RESISTANT Resistant     GENTAMICIN <=0.5 SENSITIVE Sensitive     LEVOFLOXACIN >=8 RESISTANT Resistant     OXACILLIN >=4 RESISTANT Resistant     RIFAMPIN <=0.5 SENSITIVE Sensitive     TRIMETH/SULFA >=320 RESISTANT Resistant     VANCOMYCIN 1 SENSITIVE Sensitive     TETRACYCLINE <=1 SENSITIVE Sensitive     * MODERATE METHICILLIN RESISTANT STAPHYLOCOCCUS AUREUS  Culture, blood (Routine X 2) w Reflex to ID Panel     Status: None   Collection Time: 08/08/15 12:40 PM  Result Value Ref Range Status   Specimen Description BLOOD RIGHT ANTECUBITAL  Final   Special Requests IN PEDIATRIC BOTTLE 2CC  Final   Culture  Setup Time   Final    GRAM POSITIVE COCCI IN CLUSTERS PEDIATRIC BOTTLE CRITICAL RESULT CALLED TO, READ BACK BY AND VERIFIED WITH: W DAVIS,RN AT 1043 08/09/15 BY L BENFIELD    Culture METHICILLIN RESISTANT STAPHYLOCOCCUS AUREUS  Final   Report Status 08/11/2015 FINAL  Final   Organism ID, Bacteria METHICILLIN RESISTANT STAPHYLOCOCCUS AUREUS  Final      Susceptibility   Methicillin resistant staphylococcus aureus - MIC*    CIPROFLOXACIN >=8 RESISTANT Resistant     ERYTHROMYCIN >=8 RESISTANT Resistant     GENTAMICIN <=0.5 SENSITIVE Sensitive      OXACILLIN >=4 RESISTANT Resistant     TETRACYCLINE <=  1 SENSITIVE Sensitive     VANCOMYCIN 1 SENSITIVE Sensitive     TRIMETH/SULFA >=320 RESISTANT Resistant     CLINDAMYCIN <=0.25 SENSITIVE Sensitive     RIFAMPIN <=0.5 SENSITIVE Sensitive     Inducible Clindamycin NEGATIVE Sensitive     * METHICILLIN RESISTANT STAPHYLOCOCCUS AUREUS  Culture, blood (Routine X 2) w Reflex to ID Panel     Status: None   Collection Time: 08/08/15 12:45 PM  Result Value Ref Range Status   Specimen Description BLOOD RIGHT ARM  Final   Special Requests BOTTLES DRAWN AEROBIC ONLY  5CC  Final   Culture NO GROWTH 5 DAYS  Final   Report Status 08/13/2015 FINAL  Final  Culture, blood (Routine X 2) w Reflex to ID Panel     Status: None   Collection Time: 08/09/15 12:09 PM  Result Value Ref Range Status   Specimen Description BLOOD BLOOD RIGHT FOREARM  Final   Special Requests IN PEDIATRIC BOTTLE .3CC  Final   Culture NO GROWTH 5 DAYS  Final   Report Status 08/14/2015 FINAL  Final  Culture, blood (Routine X 2) w Reflex to ID Panel     Status: None   Collection Time: 08/09/15 12:15 PM  Result Value Ref Range Status   Specimen Description BLOOD RIGHT ANTECUBITAL  Final   Special Requests BOTTLES DRAWN AEROBIC ONLY 6CC  Final   Culture NO GROWTH 5 DAYS  Final   Report Status 08/14/2015 FINAL  Final  Culture, routine-abscess     Status: None   Collection Time: 08/13/15 12:16 PM  Result Value Ref Range Status   Specimen Description OTHER  Final   Special Requests LEFT SCAPULA  Final   Gram Stain   Final    RARE WBC NO SQUAMOUS EPITHELIAL CELLS SEEN NO ORGANISMS SEEN Performed at [facility]    Culture   Final    NO GROWTH 2 DAYS Performed at [facility]    Report Status 08/15/2015 FINAL  Final    Studies/Results: Dg Chest 2 View  08/15/2015  CLINICAL DATA:  Check for possible effusion EXAM: CHEST  2 VIEW COMPARISON:  08/14/2015 FINDINGS: Cardiac shadow is within normal limits. The lungs  are well-aerated bilaterally. Small pleural effusions are noted on the lateral projection likely too small for significant thoracentesis. No focal infiltrate is noted. IMPRESSION: Small pleural effusions bilaterally likely too small for adequate thoracentesis. No focal infiltrate is noted. Electronically Signed   By: Alcide Clever M.D.   On: 08/15/2015 20:01   Ir Veno/ext/uni Right  08/16/2015  INDICATION: DIABETIC FOOT ULCER, ACCESS FOR LONG-TERM ANTIBIOTICS EXAM: ULTRASOUND GUIDANCE FOR VASCULAR ACCESS RIGHT UPPER EXTREMITY VENOGRAM LEFT UPPER EXTREMITY PICC LINE PLACEMENT WITH ULTRASOUND AND FLUOROSCOPIC GUIDANCE MEDICATIONS: 1% LIDOCAINE LOCALLY ANESTHESIA/SEDATION: NONE. The patient's level of consciousness and vital signs were monitored continuously by radiology nursing throughout the procedure under my direct supervision. FLUOROSCOPY TIME:  Fluoroscopy Time: 3 minutes 30 seconds (17 mGy). COMPLICATIONS: None immediate. PROCEDURE: The patient was advised of the possible risks and complications and agreed to undergo the procedure. The patient was then brought to the angiographic suite for the procedure. Ultrasound guidance for vascular access and right upper extremity venogram: Initially under sterile conditions and local anesthesia, right basilic micropuncture venous access performed with ultrasound. Images were obtained for documentation. Basilic vein was demonstrated to be patent. Guidewire would not advance centrally for accurate measurements for a PICC line length. Five French sheath inserted. Contrast injection performed for central right  upper extremity venogram. Right upper extremity venogram: Right brachial and axillary veins are patent. 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 Jugular collaterals are present opacifying the patent right innominate vein and SVC. Several attempts were made to cross the right subclavian venous occlusion with a Kumpe  catheter and guidewire however this was unsuccessful. Therefore this access was aborted for PICC line placement. Left arm will be utilized. Left upper extremity PICC line insertion with ultrasound guidance: The left arm was prepped with chlorhexidine, draped in the usual sterile fashion using maximum barrier technique (cap and mask, sterile gown, sterile gloves, large sterile sheet, hand hygiene and cutaneous antisepsis) and infiltrated locally with 1% Lidocaine. Ultrasound demonstrated patency of the left basilic vein, and this was documented with an image. Under real-time ultrasound guidance, this vein was accessed with a 21 gauge micropuncture needle and image documentation was performed. A 0.018 wire was introduced in to the vein. Over this, a 5 [place] single lumen power PICC was advanced to the lower SVC/right atrial junction. Fluoroscopy during the procedure and fluoro spot radiograph confirms appropriate catheter position. The catheter was flushed and covered with asterile dressing. Catheter length: None immediate IMPRESSION: Initial attempts at placement of a right PICC line were unsuccessful related to difficulty pass the guidewire. 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 Successful left arm power PICC line placement with ultrasound and fluoroscopic guidance. The catheter is ready for use. Electronically Signed   By: Judie Petit.  Shick M.D.   On: 08/16/2015 17:24   Ir Fluoro Guide Cv Line Left  08/16/2015  INDICATION: DIABETIC FOOT ULCER, ACCESS FOR LONG-TERM ANTIBIOTICS EXAM: ULTRASOUND GUIDANCE FOR VASCULAR ACCESS RIGHT UPPER EXTREMITY VENOGRAM LEFT UPPER EXTREMITY PICC LINE PLACEMENT WITH ULTRASOUND AND FLUOROSCOPIC GUIDANCE MEDICATIONS: 1% LIDOCAINE LOCALLY ANESTHESIA/SEDATION: NONE. The patient's level of consciousness and vital signs were monitored continuously by radiology nursing throughout the procedure under my direct supervision. FLUOROSCOPY TIME:   Fluoroscopy Time: 3 minutes 30 seconds (17 mGy). COMPLICATIONS: None immediate. PROCEDURE: The patient was advised of the possible risks and complications and agreed to undergo the procedure. The patient was then brought to the angiographic suite for the procedure. Ultrasound guidance for vascular access and right upper extremity venogram: Initially under sterile conditions and local anesthesia, right basilic micropuncture venous access performed with ultrasound. Images were obtained for documentation. Basilic vein was demonstrated to be patent. Guidewire would not advance centrally for accurate measurements for a PICC line length. Five French sheath inserted. Contrast injection performed for central right upper extremity venogram. Right upper extremity venogram: Right brachial and axillary veins are patent. 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 Jugular collaterals are present opacifying the patent right innominate vein and SVC. Several attempts were made to cross the right subclavian venous occlusion with a Kumpe catheter and guidewire however this was unsuccessful. Therefore this access was aborted for PICC line placement. Left arm will be utilized. Left upper extremity PICC line insertion with ultrasound guidance: The left arm was prepped with chlorhexidine, draped in the usual sterile fashion using maximum barrier technique (cap and mask, sterile gown, sterile gloves, large sterile sheet, hand hygiene and cutaneous antisepsis) and infiltrated locally with 1% Lidocaine. Ultrasound demonstrated patency of the left basilic vein, and this was documented with an image. Under real-time ultrasound guidance, this vein was accessed with a 21 gauge micropuncture needle and image documentation was performed.  A 0.018 wire was introduced in to the vein. Over this, a 5 [place] single lumen power PICC was advanced to the lower SVC/right atrial junction. Fluoroscopy during  the procedure and fluoro spot radiograph confirms appropriate catheter position. The catheter was flushed and covered with asterile dressing. Catheter length: None immediate IMPRESSION: Initial attempts at placement of a right PICC line were unsuccessful related to difficulty pass the guidewire. 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 Successful left arm power PICC line placement with ultrasound and fluoroscopic guidance. The catheter is ready for use. Electronically Signed   By: Judie Petit.  Shick M.D.   On: 08/16/2015 17:24   Ir US Guide Vasc Access Left  08/16/2015  INDICATION: DIABETIC FOOT ULCER, ACCESS FOR LONG-TERM ANTIBIOTICS EXAM: ULTRASOUND GUIDANCE FOR VASCULAR ACCESS RIGHT UPPER EXTREMITY VENOGRAM LEFT UPPER EXTREMITY PICC LINE PLACEMENT WITH ULTRASOUND AND FLUOROSCOPIC GUIDANCE MEDICATIONS: 1% LIDOCAINE LOCALLY ANESTHESIA/SEDATION: NONE. The patient's level of consciousness and vital signs were monitored continuously by radiology nursing throughout the procedure under my direct supervision. FLUOROSCOPY TIME:  Fluoroscopy Time: 3 minutes 30 seconds (17 mGy). COMPLICATIONS: None immediate. PROCEDURE: The patient was advised of the possible risks and complications and agreed to undergo the procedure. The patient was then brought to the angiographic suite for the procedure. Ultrasound guidance for vascular access and right upper extremity venogram: Initially under sterile conditions and local anesthesia, right basilic micropuncture venous access performed with ultrasound. Images were obtained for documentation. Basilic vein was demonstrated to be patent. Guidewire would not advance centrally for accurate measurements for a PICC line length. Five French sheath inserted. Contrast injection performed for central right upper extremity venogram. Right upper extremity venogram: Right brachial and axillary veins are patent. 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 Jugular collaterals are present opacifying the patent right innominate vein and SVC. Several attempts were made to cross the right subclavian venous occlusion with a Kumpe catheter and guidewire however this was unsuccessful. Therefore this access was aborted for PICC line placement. Left arm will be utilized. Left upper extremity PICC line insertion with ultrasound guidance: The left arm was prepped with chlorhexidine, draped in the usual sterile fashion using maximum barrier technique (cap and mask, sterile gown, sterile gloves, large sterile sheet, hand hygiene and cutaneous antisepsis) and infiltrated locally with 1% Lidocaine. Ultrasound demonstrated patency of the left basilic vein, and this was documented with an image. Under real-time ultrasound guidance, this vein was accessed with a 21 gauge micropuncture needle and image documentation was performed. A 0.018 wire was introduced in to the vein. Over this, a 5 [place] single lumen power PICC was advanced to the lower SVC/right atrial junction. Fluoroscopy during the procedure and fluoro spot radiograph confirms appropriate catheter position. The catheter was flushed and covered with asterile dressing. Catheter length: None immediate IMPRESSION: Initial attempts at placement of a right PICC line were unsuccessful related to difficulty pass the guidewire. 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 Successful left arm power PICC line placement with ultrasound and fluoroscopic guidance. The catheter is ready for use. Electronically Signed   By: Judie Petit.  Shick M.D.   On: 08/16/2015 17:24   Ir US Guide Vasc Access Right  08/16/2015  INDICATION: DIABETIC FOOT ULCER, ACCESS FOR LONG-TERM ANTIBIOTICS EXAM: ULTRASOUND GUIDANCE FOR VASCULAR ACCESS RIGHT UPPER EXTREMITY VENOGRAM LEFT UPPER EXTREMITY PICC LINE PLACEMENT WITH ULTRASOUND AND FLUOROSCOPIC GUIDANCE  MEDICATIONS: 1%  LIDOCAINE LOCALLY ANESTHESIA/SEDATION: NONE. The patient's level of consciousness and vital signs were monitored continuously by radiology nursing throughout the procedure under my direct supervision. FLUOROSCOPY TIME:  Fluoroscopy Time: 3 minutes 30 seconds (17 mGy). COMPLICATIONS: None immediate. PROCEDURE: The patient was advised of the possible risks and complications and agreed to undergo the procedure. The patient was then brought to the angiographic suite for the procedure. Ultrasound guidance for vascular access and right upper extremity venogram: Initially under sterile conditions and local anesthesia, right basilic micropuncture venous access performed with ultrasound. Images were obtained for documentation. Basilic vein was demonstrated to be patent. Guidewire would not advance centrally for accurate measurements for a PICC line length. Five French sheath inserted. Contrast injection performed for central right upper extremity venogram. Right upper extremity venogram: Right brachial and axillary veins are patent. 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 Jugular collaterals are present opacifying the patent right innominate vein and SVC. Several attempts were made to cross the right subclavian venous occlusion with a Kumpe catheter and guidewire however this was unsuccessful. Therefore this access was aborted for PICC line placement. Left arm will be utilized. Left upper extremity PICC line insertion with ultrasound guidance: The left arm was prepped with chlorhexidine, draped in the usual sterile fashion using maximum barrier technique (cap and mask, sterile gown, sterile gloves, large sterile sheet, hand hygiene and cutaneous antisepsis) and infiltrated locally with 1% Lidocaine. Ultrasound demonstrated patency of the left basilic vein, and this was documented with an image. Under real-time ultrasound guidance, this vein was accessed  with a 21 gauge micropuncture needle and image documentation was performed. A 0.018 wire was introduced in to the vein. Over this, a 5 [place] single lumen power PICC was advanced to the lower SVC/right atrial junction. Fluoroscopy during the procedure and fluoro spot radiograph confirms appropriate catheter position. The catheter was flushed and covered with asterile dressing. Catheter length: None immediate IMPRESSION: Initial attempts at placement of a right PICC line were unsuccessful related to difficulty pass the guidewire. 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 Successful left arm power PICC line placement with ultrasound and fluoroscopic guidance. The catheter is ready for use. Electronically Signed   By: Judie Petit.  Shick M.D.   On: 08/16/2015 17:24      Assessment/Plan:  INTERVAL HISTORY:   07/2015: original cultures ID with MRSA, repeat blood cultures unfortunately PERSISTENTLY POsitive in 1/2  2/21--08/11/15: 2/19 blood cultures still + for MRSA, repeat blood cultures from 08/09/15: no growth  08/11/15--08/13/15: MRI T and L spine performed which showed R> L pleural effusion, Rim enhancing fluid collections in the posterior left chest wall fluid - associated with multiple displaced rib fractures - and in the right gluteal muscle fluid collection are suspicious for phlegmon/abscess   2/25--08/16/15: pt blood cs NGTD final, PICC in place  Pt is sp IR guided aspirate of the chest wall fluid collection  Principal Problem:   Sepsis (HCC) Active Problems:   UTI (lower urinary tract infection)   DM type 2, goal HbA1c < 7% (HCC)   Chronic hyponatremia   Infection   Hip fracture requiring operative repair (HCC)   MRSA bacteremia   Staphylococcus aureus bacteremia with sepsis (HCC)   Osteomyelitis of right hip (HCC)   Hardware complicating wound infection (HCC)   Sepsis due to methicillin resistant Staphylococcus aureus (MRSA) (HCC)    Uncontrolled type 2 diabetes mellitus with complication (HCC)  Acute urinary retention   Low back pain   Muscle spasms of both lower extremities   Controlled diabetes mellitus type 2 with complications (HCC)   Hypokalemia   Acute pyelonephritis   Abscess   Chest wall abscess   Pleural effusion   Gluteal abscess    Jeff Ferrell is a 65 y.o. male with  MRSA bacteremia sepsis due to Right hip hardware associated hip infection   #1 MRSA bacteremia     [facility] Staphylococcus aureus bacteremia   Staphylococcus aureus bacteremia (SAB) is associated with a high rate of complications and mortality.  Specific aspects of clinical management are critical to optimizing the outcome of patients with SAB.  Therefore, the [facility] Antimicrobial Management Team [facility]) has initiated an intervention aimed at improving the management of SAB at [facility].  To do so, Infectious Diseases physicians are providing an evidence-based consult for the management of all patients with SAB.     Yes No Comments  Perform follow-up blood cultures (even if the patient is afebrile) to ensure clearance of bacteremia [x]  []  NO GROWTH on repeat cultures  Remove vascular catheter and obtain follow-up blood cultures after the removal of the catheter []  []  PICC line in place now  Perform echocardiography to evaluate for endocarditis (transthoracic ECHO is 40-50% sensitive, TEE is > 90% sensitive) []  []  Please keep in mind, that neither test can definitively EXCLUDE endocarditis, and that should clinical suspicion remain high for endocarditis the patient should then still be treated with an "endocarditis" duration of therapy = 6 weeks  TEE without vegetations  Consult electrophysiologist to evaluate implanted cardiac device (pacemaker, ICD) []  []    Ensure source control []  []  Have all abscesses been drained effectively? Have deep seeded infections (septic joints or osteomyelitis) had  appropriate surgical debridement?  He has had I and D of hip. He still has hardware that may need to be removed but trying to keep it in for now due to his fracture   Investigate for "metastatic" sites of infection []  []  Does the patient have ANY symptom or physical exam finding that would suggest a deeper infection (back or neck pain that may be suggestive of vertebral osteomyelitis or epidural abscess, muscle pain that could be a symptom of pyomyositis)?  Keep in mind that for deep seeded infections MRI imaging with contrast is preferred rather than other often insensitive tests such as plain x-rays, especially early in a patient's presentation.  I am concerned about his worsening mid back and lower back pain. MRI L spine done in January did not show diskitis  He has had aspirate of his chest wall fluid collection which seems to be another nidus, abscess  RIGHT GLUTEAL MUSCLE FLUID COLLECTION--WOULD ASK ORTHO TO REVIEW IF NOT DRAINED WOULD GET REPEAT MRI   Change antibiotic therapy to Vancomycin []  []  Beta-lactam antibiotics are preferred for MSSA due to higher cure rates.   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 Would discuss with [facility], May NEED TO REPEAT MRI 7-10 from last one TO SEE IF IT BECOMES A MORE WELL DEFINED ABSCESS FOR I AND D  #5  Pleural effusions:  IR does not feel they are sufficiently large for drainage    LOS: 9 days   Jeff Ferrell 08/16/2015, 7:04 PM

## 2015-08-16 NOTE — Progress Notes (Signed)
CSW spoke with patient regarding discharge plan. Patient would like to return to [facility] once he is medically stable. [facility] aware and sent over paperwork. CSW gave paperwork to patient. CSW to continue to follow.  Jeff Ferrell Tishara Pizano LCSWA [phone]

## 2015-08-16 NOTE — Progress Notes (Signed)
During pericare it was discovered the right hip VAC dressing is leaking.  Unit Secretary asked to order one for patient.

## 2015-08-16 NOTE — Procedures (Signed)
RUE VENOGRAM SHOWS CENTRAL RT SUBCLAVIAN CHRONIC VENOUS OCCLUSION Successful LUE SL POWER PICC TIP SVC/RA NO COMP STABLE 45 CM EBL 5CC

## 2015-08-16 NOTE — Progress Notes (Signed)
[facility] TEAM 1 - Stepdown/ICU TEAM PROGRESS NOTE  Jeff Ferrell:[id number] DOB: 1951/02/25 DOA: 08/07/2015 PCP: [name], MD  Admit HPI / Brief Narrative: 65 y.o. male with DM not on insulin, chronic hyponatremia, and recent fracture of the right femoral neck with repair on 2/1 who presented w/ drainage from the wound. He was noted to be tachycardic with a fever of 99 and suspicion of a wound infection.  He was also noted to have a UA positive for pyuria.  HPI/Subjective: The patient complains of severe ongoing right leg spasms that are currently not responding at all to anti-spasmodic medications.  He denies chest pain shortness breath fevers or chills.  Assessment/Plan:  Sepsis w/ MRSA Pyelonephritis - MRSA bacteremia - infected R hip surgical wound -taken to the OR for I&D 2/19 - care per ID and Ortho - TEE negative for valvular vegetation - 2/20 blood cultures 2/2 negative > PICC placed today for long term abx  Left chest wall Phlegmon / Abscess -2/24/ S/P IR aspiration  R gluteal muscle fluid collection  -will need to ask Ortho to comment on this 2/28   Muscle spasms bilateral lower extremity -adjust tx plan and follow - pt in severe pain as a result   DM2 -CBG currently controlled - follow w/ SSI - A1c 5.7 on 2/21  Chronic Hyponatremia -Workup accomplished during last hospital stay concluded hypovolemia was etiology - stable at present - follow   Acute urinary retention  -was a problem during his initial hospitalization for hip surgery - now recurring and requiring foley placement - cont antispasmotics and follow - begin voiding trials once LE spasms better controlled   Normocytic anemia  -transfuse for Hgb < 7.0 - stable at present   Code Status: FULL Family Communication: no family present at time of exam Disposition Plan: stable for transfer to orthopedic floor   Consultants: ID Ortho IR  Procedures: 2/19 - I&D infected R hip wound  2/23 MRI  T-spine/L-spine;-Massively distended urinary bladder, causing 2dary Hydronephrosis. -No evidence of acute thoracic or lumbar spinal infection. -Rim enhancing fluid collections in the posterior left chest wall fluid - associated with multiple displaced rib fractures - and in the right gluteal muscle fluid collection are suspicious for phlegmon/abscess - Lt >>Rt pleural effusions with bilateral lower lobe pneumonia. -Degenerative thoracic spinal stenosis Stable degenerative lumbar spinal stenosis; moderate degenerative cervical spinal stenosis  2/24 TEE EF= 60% to 65%. WMN - negative valvular thrombus - Pericardium, extracardiac: A trivial pericardial effusion  2/24Lt scapula fluid aspiration;3 cc serosanguinous fluid 2/27 - L UE PICC in IR   Antibiotics: Aztreonam 2/18 Vancomycin 2/18 > Ceftriaxone 2/19 > Rifampin 2/19 >   DVT prophylaxis: lovenox   Objective: Blood pressure 128/75, pulse 92, temperature 97.9 F (36.6 C), temperature source Oral, resp. rate 15, height 5\' 9"  (1.753 m), weight 73.1 kg (161 lb 2.5 oz), SpO2 97 %.  Intake/Output Summary (Last 24 hours) at 08/16/15 1452 Last data filed at 08/16/15 1100  Gross per 24 hour  Intake   2840 ml  Output   4826 ml  Net  -1986 ml   Exam: General: No acute respiratory distress  Lungs: Clear to auscultation bilaterally  Cardiovascular: Regular rate and rhythm without murmur  Abdomen: Nontender, nondistended, soft, bowel sounds positive, no rebound, no ascites, no appreciable mass Extremities: No significant cyanosis, clubbing, or edma bilateral lower extremities  Data Reviewed:  Basic Metabolic Panel:  Recent Labs Lab 08/12/15 0540 08/13/15 0518 08/14/15 0221 08/15/15 0422 08/16/15  0312  NA 130* 130* 130* 137 134*  K 3.6 3.6 3.8 4.2 3.8  CL 96* 95* 98* 102 99*  CO2 26 26 24 25 26   GLUCOSE 108* 118* 129* 118* 111*  BUN <5* <5* <5* <5* <5*  CREATININE 0.44* 0.35* 0.49* 0.62 0.53*  CALCIUM 8.3* 8.4* 8.0* 8.3*  8.5*  MG 1.3* 1.4* 1.8 1.7 1.6*    CBC:  Recent Labs Lab 08/12/15 0540 08/13/15 0518 08/14/15 0221 08/15/15 0422 08/16/15 0312  WBC 7.3 6.8 7.7 6.5 7.5  NEUTROABS 4.7 4.6 5.4 4.7 5.4  HGB 7.7* 7.8* 7.9* 8.1* 8.3*  HCT 22.8* 23.7* 24.2* 26.1* 27.0*  MCV 77.6* 79.3 77.6* 78.6 79.4  PLT 346 388 452* 441* 564*    Liver Function Tests:  Recent Labs Lab 08/12/15 0540 08/13/15 0518 08/14/15 0221 08/15/15 0422 08/16/15 0312  AST 21 21 27 26 20   ALT 10* 9* 13* 11* 11*  ALKPHOS 178* 177* 208* 197* 205*  BILITOT 0.6 0.5 0.6 0.7 0.7  PROT 5.7* 5.5* 5.9* 5.8* 6.3*  ALBUMIN 2.2* 2.2* 2.2* 2.2* 2.3*   CBG:  Recent Labs Lab 08/15/15 1246 08/15/15 1651 08/15/15 2125 08/16/15 0747 08/16/15 1058  GLUCAP 132* 74 93 92 86    Recent Results (from the past 240 hour(s))  Blood Culture (routine x 2)     Status: None   Collection Time: 08/07/15 10:47 AM  Result Value Ref Range Status   Specimen Description LEFT ANTECUBITAL  Final   Special Requests BOTTLES DRAWN AEROBIC AND ANAEROBIC 5CC  Final   Culture  Setup Time   Final    GRAM POSITIVE COCCI IN CLUSTERS IN BOTH AEROBIC AND ANAEROBIC BOTTLES CRITICAL RESULT CALLED TO, READ BACK BY AND VERIFIED WITH: TKayren Eaves RN [phone] GREEN R CONFIRMED BY M. CAMPBELL    Culture METHICILLIN RESISTANT STAPHYLOCOCCUS AUREUS  Final   Report Status 08/10/2015 FINAL  Final   Organism ID, Bacteria METHICILLIN RESISTANT STAPHYLOCOCCUS AUREUS  Final      Susceptibility   Methicillin resistant staphylococcus aureus - MIC*    CIPROFLOXACIN >=8 RESISTANT Resistant     ERYTHROMYCIN >=8 RESISTANT Resistant     GENTAMICIN <=0.5 SENSITIVE Sensitive     OXACILLIN >=4 RESISTANT Resistant     TETRACYCLINE <=1 SENSITIVE Sensitive     VANCOMYCIN <=0.5 SENSITIVE Sensitive     TRIMETH/SULFA >=320 RESISTANT Resistant     CLINDAMYCIN <=0.25 SENSITIVE Sensitive     RIFAMPIN <=0.5 SENSITIVE Sensitive     Inducible Clindamycin NEGATIVE Sensitive     *  METHICILLIN RESISTANT STAPHYLOCOCCUS AUREUS  Wound culture     Status: None   Collection Time: 08/07/15 10:53 AM  Result Value Ref Range Status   Specimen Description WOUND RIGHT HIP  Final   Special Requests SKIN DEEP EXCISION   Final   Gram Stain   Final    NO WBC SEEN NO SQUAMOUS EPITHELIAL CELLS SEEN NO ORGANISMS SEEN Performed at [facility]    Culture   Final    ABUNDANT METHICILLIN RESISTANT STAPHYLOCOCCUS AUREUS Note: RIFAMPIN AND GENTAMICIN SHOULD NOT BE USED AS SINGLE DRUGS FOR TREATMENT OF STAPH INFECTIONS. This organism DOES NOT demonstrate inducible Clindamycin resistance in vitro. CRITICAL RESULT CALLED TO, READ BACK BY AND VERIFIED WITH: WHITNEY DAVIS @  9:10 AM 08/10/15 BY DWEEKS Performed at [facility]    Report Status 08/10/2015 FINAL  Final   Organism ID, Bacteria METHICILLIN RESISTANT STAPHYLOCOCCUS AUREUS  Final      Susceptibility  Methicillin resistant staphylococcus aureus - MIC*    CLINDAMYCIN <=0.25 SENSITIVE Sensitive     ERYTHROMYCIN >=8 RESISTANT Resistant     GENTAMICIN <=0.5 SENSITIVE Sensitive     LEVOFLOXACIN >=8 RESISTANT Resistant     OXACILLIN >=4 RESISTANT Resistant     RIFAMPIN <=0.5 SENSITIVE Sensitive     TRIMETH/SULFA >=320 RESISTANT Resistant     VANCOMYCIN <=0.5 SENSITIVE Sensitive     TETRACYCLINE <=1 SENSITIVE Sensitive     * ABUNDANT METHICILLIN RESISTANT STAPHYLOCOCCUS AUREUS  Blood Culture (routine x 2)     Status: None   Collection Time: 08/07/15 11:18 AM  Result Value Ref Range Status   Specimen Description BLOOD LEFT HAND  Final   Special Requests BOTTLES DRAWN AEROBIC ONLY 10CC  Final   Culture  Setup Time   Final    GRAM POSITIVE COCCI IN CLUSTERS AEROBIC BOTTLE ONLY CRITICAL RESULT CALLED TO, READ BACK BY AND VERIFIED WITH: TKayren Eaves RN [phone] GREEN R CONFIRMED BY M. CAMPBELL    Culture   Final    STAPHYLOCOCCUS AUREUS SUSCEPTIBILITIES PERFORMED ON PREVIOUS CULTURE WITHIN THE LAST 5 DAYS.     Report Status 08/10/2015 FINAL  Final  Urine culture     Status: None   Collection Time: 08/07/15 12:23 PM  Result Value Ref Range Status   Specimen Description URINE, CATHETERIZED  Final   Special Requests NONE  Final   Culture   Final    >=100,000 COLONIES/mL METHICILLIN RESISTANT STAPHYLOCOCCUS AUREUS   Report Status 08/09/2015 FINAL  Final   Organism ID, Bacteria METHICILLIN RESISTANT STAPHYLOCOCCUS AUREUS  Final      Susceptibility   Methicillin resistant staphylococcus aureus - MIC*    CIPROFLOXACIN >=8 RESISTANT Resistant     GENTAMICIN <=0.5 SENSITIVE Sensitive     NITROFURANTOIN <=16 SENSITIVE Sensitive     OXACILLIN >=4 RESISTANT Resistant     TETRACYCLINE <=1 SENSITIVE Sensitive     VANCOMYCIN 1 SENSITIVE Sensitive     TRIMETH/SULFA >=320 RESISTANT Resistant     CLINDAMYCIN <=0.25 SENSITIVE Sensitive     RIFAMPIN <=0.5 SENSITIVE Sensitive     Inducible Clindamycin NEGATIVE Sensitive     * >=100,000 COLONIES/mL METHICILLIN RESISTANT STAPHYLOCOCCUS AUREUS  MRSA PCR Screening     Status: Abnormal   Collection Time: 08/08/15  5:32 AM  Result Value Ref Range Status   MRSA by PCR POSITIVE (A) NEGATIVE Final    Comment:        The GeneXpert MRSA Assay (FDA approved for NASAL specimens only), is one component of a comprehensive MRSA colonization surveillance program. It is not intended to diagnose MRSA infection nor to guide or monitor treatment for MRSA infections. RESULT CALLED TO, READ BACK BY AND VERIFIED WITH: RN GORDON,R AT 1002 [phone] MARTINB   Anaerobic culture     Status: None   Collection Time: 08/08/15  8:28 AM  Result Value Ref Range Status   Specimen Description WOUND RIGHT LEG  Final   Special Requests POF VANCOMYCIN LOWER RIGHT LEG INCISION PART A   Final   Gram Stain   Final    ABUNDANT WBC PRESENT, PREDOMINANTLY PMN NO SQUAMOUS EPITHELIAL CELLS SEEN FEW GRAM POSITIVE COCCI IN CLUSTERS IN PAIRS Performed at [facility]    Culture    Final    NO ANAEROBES ISOLATED Performed at [facility]    Report Status 08/13/2015 FINAL  Final  Wound culture     Status: None   Collection Time: 08/08/15  8:28 AM  Result Value Ref Range Status   Specimen Description WOUND RIGHT LEG  Final   Special Requests POF VANCOMYCIN RIGHT LOWER LEG INCISION PART A  Final   Gram Stain   Final    FEW WBC PRESENT, PREDOMINANTLY PMN NO SQUAMOUS EPITHELIAL CELLS SEEN RARE GRAM POSITIVE COCCI IN PAIRS Performed at [facility]    Culture   Final    MODERATE METHICILLIN RESISTANT STAPHYLOCOCCUS AUREUS Note: RIFAMPIN AND GENTAMICIN SHOULD NOT BE USED AS SINGLE DRUGS FOR TREATMENT OF STAPH INFECTIONS. This organism DOES NOT demonstrate inducible Clindamycin resistance in vitro. CRITICAL RESULT CALLED TO, READ BACK BY AND VERIFIED WITH: CENLINA S 2/22 @  0920 BY REAMM Performed at [facility]    Report Status 08/11/2015 FINAL  Final   Organism ID, Bacteria METHICILLIN RESISTANT STAPHYLOCOCCUS AUREUS  Final      Susceptibility   Methicillin resistant staphylococcus aureus - MIC*    CLINDAMYCIN <=0.25 SENSITIVE Sensitive     ERYTHROMYCIN >=8 RESISTANT Resistant     GENTAMICIN <=0.5 SENSITIVE Sensitive     LEVOFLOXACIN >=8 RESISTANT Resistant     OXACILLIN >=4 RESISTANT Resistant     RIFAMPIN <=0.5 SENSITIVE Sensitive     TRIMETH/SULFA >=320 RESISTANT Resistant     VANCOMYCIN 1 SENSITIVE Sensitive     TETRACYCLINE <=1 SENSITIVE Sensitive     * MODERATE METHICILLIN RESISTANT STAPHYLOCOCCUS AUREUS  Anaerobic culture     Status: None   Collection Time: 08/08/15  8:49 AM  Result Value Ref Range Status   Specimen Description WOUND RIGHT LEG  Final   Special Requests POF VANCOMYCIN PROXIMAL RIGHT LOWER LEG PART B  Final   Gram Stain   Final    ABUNDANT WBC PRESENT, PREDOMINANTLY PMN NO SQUAMOUS EPITHELIAL CELLS SEEN FEW GRAM POSITIVE COCCI IN PAIRS IN CLUSTERS Performed at [facility]    Culture   Final    NO  ANAEROBES ISOLATED Performed at [facility]    Report Status 08/13/2015 FINAL  Final  Wound culture     Status: None   Collection Time: 08/08/15  8:49 AM  Result Value Ref Range Status   Specimen Description WOUND RIGHT LEG  Final   Special Requests POF VANCOMYCIN PROXIMAL RIGHT LEG INCISON PART B  Final   Gram Stain   Final    FEW WBC PRESENT, PREDOMINANTLY PMN NO SQUAMOUS EPITHELIAL CELLS SEEN FEW GRAM POSITIVE COCCI IN PAIRS IN CLUSTERS Performed at [facility]    Culture   Final    MODERATE METHICILLIN RESISTANT STAPHYLOCOCCUS AUREUS Note: RIFAMPIN AND GENTAMICIN SHOULD NOT BE USED AS SINGLE DRUGS FOR TREATMENT OF STAPH INFECTIONS. This organism DOES NOT demonstrate inducible Clindamycin resistance in vitro. CRITICAL RESULT CALLED TO, READ BACK BY AND VERIFIED WITH: CENLINA S 2/22 @  0920 BY REAMM Performed at [facility]    Report Status 08/11/2015 FINAL  Final   Organism ID, Bacteria METHICILLIN RESISTANT STAPHYLOCOCCUS AUREUS  Final      Susceptibility   Methicillin resistant staphylococcus aureus - MIC*    CLINDAMYCIN <=0.25 SENSITIVE Sensitive     ERYTHROMYCIN >=8 RESISTANT Resistant     GENTAMICIN <=0.5 SENSITIVE Sensitive     LEVOFLOXACIN >=8 RESISTANT Resistant     OXACILLIN >=4 RESISTANT Resistant     RIFAMPIN <=0.5 SENSITIVE Sensitive     TRIMETH/SULFA >=320 RESISTANT Resistant     VANCOMYCIN 1 SENSITIVE Sensitive     TETRACYCLINE <=1 SENSITIVE Sensitive     *  MODERATE METHICILLIN RESISTANT STAPHYLOCOCCUS AUREUS  Culture, blood (Routine X 2) w Reflex to ID Panel     Status: None   Collection Time: 08/08/15 12:40 PM  Result Value Ref Range Status   Specimen Description BLOOD RIGHT ANTECUBITAL  Final   Special Requests IN PEDIATRIC BOTTLE 2CC  Final   Culture  Setup Time   Final    GRAM POSITIVE COCCI IN CLUSTERS PEDIATRIC BOTTLE CRITICAL RESULT CALLED TO, READ BACK BY AND VERIFIED WITH: W DAVIS,RN AT 1043 08/09/15 BY L BENFIELD     Culture METHICILLIN RESISTANT STAPHYLOCOCCUS AUREUS  Final   Report Status 08/11/2015 FINAL  Final   Organism ID, Bacteria METHICILLIN RESISTANT STAPHYLOCOCCUS AUREUS  Final      Susceptibility   Methicillin resistant staphylococcus aureus - MIC*    CIPROFLOXACIN >=8 RESISTANT Resistant     ERYTHROMYCIN >=8 RESISTANT Resistant     GENTAMICIN <=0.5 SENSITIVE Sensitive     OXACILLIN >=4 RESISTANT Resistant     TETRACYCLINE <=1 SENSITIVE Sensitive     VANCOMYCIN 1 SENSITIVE Sensitive     TRIMETH/SULFA >=320 RESISTANT Resistant     CLINDAMYCIN <=0.25 SENSITIVE Sensitive     RIFAMPIN <=0.5 SENSITIVE Sensitive     Inducible Clindamycin NEGATIVE Sensitive     * METHICILLIN RESISTANT STAPHYLOCOCCUS AUREUS  Culture, blood (Routine X 2) w Reflex to ID Panel     Status: None   Collection Time: 08/08/15 12:45 PM  Result Value Ref Range Status   Specimen Description BLOOD RIGHT ARM  Final   Special Requests BOTTLES DRAWN AEROBIC ONLY  5CC  Final   Culture NO GROWTH 5 DAYS  Final   Report Status 08/13/2015 FINAL  Final  Culture, blood (Routine X 2) w Reflex to ID Panel     Status: None   Collection Time: 08/09/15 12:09 PM  Result Value Ref Range Status   Specimen Description BLOOD BLOOD RIGHT FOREARM  Final   Special Requests IN PEDIATRIC BOTTLE .3CC  Final   Culture NO GROWTH 5 DAYS  Final   Report Status 08/14/2015 FINAL  Final  Culture, blood (Routine X 2) w Reflex to ID Panel     Status: None   Collection Time: 08/09/15 12:15 PM  Result Value Ref Range Status   Specimen Description BLOOD RIGHT ANTECUBITAL  Final   Special Requests BOTTLES DRAWN AEROBIC ONLY 6CC  Final   Culture NO GROWTH 5 DAYS  Final   Report Status 08/14/2015 FINAL  Final  Culture, routine-abscess     Status: None   Collection Time: 08/13/15 12:16 PM  Result Value Ref Range Status   Specimen Description OTHER  Final   Special Requests LEFT SCAPULA  Final   Gram Stain   Final    RARE WBC NO SQUAMOUS EPITHELIAL CELLS  SEEN NO ORGANISMS SEEN Performed at [facility]    Culture   Final    NO GROWTH 2 DAYS Performed at [facility]    Report Status 08/15/2015 FINAL  Final     Studies:   Recent x-ray studies have been reviewed in detail by the Attending Physician  Scheduled Meds:  Scheduled Meds: . sodium chloride   Intravenous Once  . aspirin EC  325 mg Oral Daily  . docusate sodium  100 mg Oral BID  . feeding supplement (ENSURE ENLIVE)  237 mL Oral TID BM  . gabapentin  300 mg Oral TID  . insulin aspart  0-5 Units Subcutaneous QHS  . insulin aspart  0-9 Units Subcutaneous TID WC  . lidocaine      . methocarbamol  1,000 mg Oral QID  . rifampin  600 mg Oral Daily  . tamsulosin  0.4 mg Oral Daily  . vancomycin  1,500 mg Intravenous Q12H    Time spent on care of this patient: 35 mins   Kawthar Ennen T , MD   [facility] Office  [phone] Pager - Text Page per Loretha Stapler as per below:  On-Call/Text Page:      [email or web address]      password [email or web address]  If 7PM-7AM, please contact night-coverage [email or web address] Password [email or web address] 08/16/2015, 2:52 PM   LOS: 9 days

## 2015-08-16 NOTE — Progress Notes (Signed)
Second attempt made to call report to 5N room 29 RN.  RN unavailable at this time.

## 2015-08-16 NOTE — Care Management Important Message (Signed)
Important Message  Patient Details  Name: Jeff Ferrell MRN: [id number] Date of Birth: 26-Jul-1950   Medicare Important Message Given:  Yes    Leone Haven, RN 08/16/2015, 11:18 AMImportant Message  Patient Details  Name: Jeff Ferrell MRN: [id number] Date of Birth: 1950-11-26   Medicare Important Message Given:  Yes    Leone Haven, RN 08/16/2015, 11:18 AM

## 2015-08-16 NOTE — Progress Notes (Signed)
VAC dressing to right hip reinforced with drape from new kit.

## 2015-08-16 NOTE — NC FL2 (Signed)
[place] MEDICAID FL2 LEVEL OF CARE SCREENING TOOL     IDENTIFICATION  Patient Name: Jeff Ferrell Birthdate: [birth year] Sex: male Admission Date (Current Location): 08/07/2015  [facility] and [place] Number:  Producer, television/film/video and Address:  [facility], [address], [place], [place] [address]      Provider Number: [id number]  Attending Physician Name and Address:  Lonia Blood, MD  Relative Name and Phone Number:  Florina Ou [phone])    Current Level of Care: Hospital Recommended Level of Care: Skilled Nursing Facility Prior Approval Number:    Date Approved/Denied:   PASRR Number: [id number]  Discharge Plan: SNF    Current Diagnoses: Patient Active Problem List   Diagnosis Date Noted  . Pleural effusion   . Gluteal abscess   . Abscess   . Chest wall abscess   . Low back pain   . Muscle spasms of both lower extremities   . Controlled diabetes mellitus type 2 with complications (HCC)   . Hypokalemia   . Acute pyelonephritis   . Sepsis due to methicillin resistant Staphylococcus aureus (MRSA) (HCC)   . Uncontrolled type 2 diabetes mellitus with complication (HCC)   . Acute urinary retention   . Hip fracture requiring operative repair (HCC)   . MRSA bacteremia   . Staphylococcus aureus bacteremia with sepsis (HCC)   . Osteomyelitis of right hip (HCC)   . Hardware complicating wound infection (HCC)   . Infection 08/08/2015  . Sepsis (HCC) 08/07/2015  . DM type 2, goal HbA1c < 7% (HCC) 08/07/2015  . Chronic hyponatremia 08/07/2015  . Fracture of femoral neck, right, closed 07/19/2015  . Femoral neck fracture, right, closed, initial encounter 07/19/2015  . Protein-calorie malnutrition, severe 07/19/2015  . Leg weakness, bilateral 07/02/2015  . Left leg swelling 07/02/2015  . Left-sided chest wall pain   . Acute blood loss anemia 06/21/2015  . Hyponatremia 06/20/2015  . Femur fracture, left (HCC) 06/17/2015  . Discitis  of lumbar region   . Left hip pain   . Hypoglycemia 06/16/2015  . UTI (lower urinary tract infection) 06/11/2015  . Back pain 06/11/2015  . Malnutrition of moderate degree 03/30/2015  . Diabetes mellitus (HCC) 03/29/2015    Orientation RESPIRATION BLADDER Height & Weight     Place, Situation, Time, Self  O2 (Nasal cannula 2L) Continent, Indwelling catheter (urinary catheter) Weight: 161 lb 2.5 oz (73.1 kg) Height:  5\' 9"  (175.3 cm)  BEHAVIORAL SYMPTOMS/MOOD NEUROLOGICAL BOWEL NUTRITION STATUS      Continent Diet (Please see DC summary)  AMBULATORY STATUS COMMUNICATION OF NEEDS Skin   Extensive Assist Verbally Surgical wounds, Other (Comment) (Incision on hip and shoulder; negative pressure wound)                       Personal Care Assistance Level of Assistance  Bathing, Feeding, Dressing Bathing Assistance: Limited assistance Feeding assistance: Independent Dressing Assistance: Limited assistance     Functional Limitations Warehouse manager, Speech Sight Info: Adequate Hearing Info: Adequate Speech Info: Adequate    SPECIAL CARE FACTORS FREQUENCY  PT (By licensed PT)     PT Frequency: Not yet assessed OT Frequency:  (Unable to assess)            Contractures Contractures Info: Not present    Additional Factors Info  Code Status, Allergies, Isolation Precautions, Psychotropic Code Status Info: Full Allergies Info: Penicillins Psychotropic Info: Valium Insulin Sliding Scale Info: insulin aspart (novoLOG)  injection 0-5 Units; insulin aspart (novoLOG) injection 0-9 Units; Isolation Precautions Info: MRSA     Current Medications (08/16/2015):  This is the current hospital active medication list Current Facility-Administered Medications  Medication Dose Route Frequency Provider Last Rate Last Dose  . 0.9 %  sodium chloride infusion   Intravenous Continuous Lonia Blood, MD 100 mL/hr at 08/16/15 1552 100 mL/hr at 08/16/15 1552  . 0.9 %  sodium chloride  infusion   Intravenous Once Leanne Chang, NP      . acetaminophen (TYLENOL) tablet 650 mg  650 mg Oral Q6H PRN Cammy Copa, MD   650 mg at 08/16/15 0239   Or  . acetaminophen (TYLENOL) suppository 650 mg  650 mg Rectal Q6H PRN Cammy Copa, MD      . alum & mag hydroxide-simeth (MAALOX/MYLANTA) 200-200-20 MG/5ML suspension 30 mL  30 mL Oral Q6H PRN Calvert Cantor, MD      . aspirin EC tablet 325 mg  325 mg Oral Daily Naiping Donnelly Stager, MD   325 mg at 08/16/15 1000  . diazepam (VALIUM) tablet 5 mg  5 mg Oral QID Lonia Blood, MD      . docusate sodium (COLACE) capsule 100 mg  100 mg Oral BID Lonia Blood, MD   100 mg at 08/15/15 2150  . feeding supplement (ENSURE ENLIVE) (ENSURE ENLIVE) liquid 237 mL  237 mL Oral TID BM Calvert Cantor, MD   237 mL at 08/16/15 1457  . flumazenil (ROMAZICON) injection 0.2 mg  0.2 mg Intravenous PRN Drema Dallas, MD      . gabapentin (NEURONTIN) capsule 300 mg  300 mg Oral TID Drema Dallas, MD   300 mg at 08/16/15 1552  . insulin aspart (novoLOG) injection 0-5 Units  0-5 Units Subcutaneous QHS Lonia Blood, MD   0 Units at 08/08/15 2200  . insulin aspart (novoLOG) injection 0-9 Units  0-9 Units Subcutaneous TID WC Lonia Blood, MD   1 Units at 08/15/15 1324  . lidocaine (XYLOCAINE) 1 % (with pres) injection           . metaxalone (SKELAXIN) tablet 800 mg  800 mg Oral QID Lonia Blood, MD   800 mg at 08/16/15 1553  . metoCLOPramide (REGLAN) tablet 5-10 mg  5-10 mg Oral Q8H PRN Cammy Copa, MD       Or  . metoCLOPramide (REGLAN) injection 5-10 mg  5-10 mg Intravenous Q8H PRN Cammy Copa, MD      . morphine 2 MG/ML injection 2-4 mg  2-4 mg Intravenous Q2H PRN Lonia Blood, MD   4 mg at 08/16/15 0849  . naloxone [facility]) injection 0.4 mg  0.4 mg Intravenous PRN Drema Dallas, MD      . ondansetron [facility]) tablet 4 mg  4 mg Oral Q6H PRN Cammy Copa, MD       Or  . ondansetron [facility]) injection 4 mg  4  mg Intravenous Q6H PRN Cammy Copa, MD      . oxyCODONE (Oxy IR/ROXICODONE) immediate release tablet 5-10 mg  5-10 mg Oral Q3H PRN Lonia Blood, MD   10 mg at 08/16/15 0239  . rifampin (RIFADIN) capsule 600 mg  600 mg Oral Daily Cliffton Asters, MD   600 mg at 08/16/15 1046  . tamsulosin (FLOMAX) capsule 0.4 mg  0.4 mg Oral Daily Lonia Blood, MD   0.4 mg at 08/16/15 0959  . vancomycin (  VANCOCIN) 1,500 mg in sodium chloride 0.9 % 500 mL IVPB  1,500 mg Intravenous Q12H Almon Hercules, RPH   1,500 mg at 08/16/15 1610     Discharge Medications: Please see discharge summary for a list of discharge medications.  Relevant Imaging Results:  Relevant Lab Results:   Additional Information SSN: [id number]  Mearl Latin, LCSWA

## 2015-08-17 DIAGNOSIS — R222 Localized swelling, mass and lump, trunk: Secondary | ICD-10-CM

## 2015-08-17 DIAGNOSIS — T8469XA Infection and inflammatory reaction due to internal fixation device of other site, initial encounter: Secondary | ICD-10-CM

## 2015-08-17 DIAGNOSIS — R6520 Severe sepsis without septic shock: Secondary | ICD-10-CM

## 2015-08-17 LAB — GLUCOSE, CAPILLARY
GLUCOSE-CAPILLARY: 118 mg/dL — AB (ref 65–99)
Glucose-Capillary: 105 mg/dL — ABNORMAL HIGH (ref 65–99)
Glucose-Capillary: 120 mg/dL — ABNORMAL HIGH (ref 65–99)

## 2015-08-17 LAB — COMPREHENSIVE METABOLIC PANEL
ALBUMIN: 2.2 g/dL — AB (ref 3.5–5.0)
ALK PHOS: 186 U/L — AB (ref 38–126)
ALT: 10 U/L — ABNORMAL LOW (ref 17–63)
ANION GAP: 10 (ref 5–15)
AST: 20 U/L (ref 15–41)
BILIRUBIN TOTAL: 0.2 mg/dL — AB (ref 0.3–1.2)
CALCIUM: 8.7 mg/dL — AB (ref 8.9–10.3)
CO2: 25 mmol/L (ref 22–32)
Chloride: 101 mmol/L (ref 101–111)
Creatinine, Ser: 0.49 mg/dL — ABNORMAL LOW (ref 0.61–1.24)
GFR calc Af Amer: >60 mL/min (ref >60)
GLUCOSE: 113 mg/dL — AB (ref 65–99)
POTASSIUM: 4 mmol/L (ref 3.5–5.1)
Sodium: 136 mmol/L (ref 135–145)
TOTAL PROTEIN: 6.2 g/dL — AB (ref 6.5–8.1)

## 2015-08-17 LAB — C DIFFICILE QUICK SCREEN W PCR REFLEX
C DIFFICILE (CDIFF) INTERP: NEGATIVE
C Diff antigen: NEGATIVE
C Diff toxin: NEGATIVE

## 2015-08-17 LAB — MAGNESIUM: Magnesium: 1.8 mg/dL (ref 1.7–2.4)

## 2015-08-17 LAB — CBC
HEMATOCRIT: 24.3 % — AB (ref 39.0–52.0)
Hemoglobin: 7.9 g/dL — ABNORMAL LOW (ref 13.0–17.0)
MCH: 25.6 pg — AB (ref 26.0–34.0)
MCHC: 32.5 g/dL (ref 30.0–36.0)
MCV: 78.6 fL (ref 78.0–100.0)
PLATELETS: 538 10*3/uL — AB (ref 150–400)
RBC: 3.09 MIL/uL — ABNORMAL LOW (ref 4.22–5.81)
RDW: 15 % (ref 11.5–15.5)
WBC: 8.6 10*3/uL (ref 4.0–10.5)

## 2015-08-17 LAB — VANCOMYCIN, TROUGH: VANCOMYCIN TR: 18 ug/mL (ref 10.0–20.0)

## 2015-08-17 MED ORDER — SODIUM CHLORIDE 0.9% FLUSH
10.0000 mL | INTRAVENOUS | Status: DC | PRN
  Administered 2015-08-27: 10 mL
  Administered 2015-08-28: 20 mL
  Administered 2015-08-31 – 2015-09-03 (×2): 10 mL
  Filled 2015-08-17 (×4): qty 40

## 2015-08-17 MED ORDER — METAXALONE 800 MG PO TABS
800.0000 mg | ORAL_TABLET | Freq: Four times a day (QID) | ORAL | Status: DC
  Administered 2015-08-17 – 2015-08-18 (×5): 800 mg via ORAL
  Filled 2015-08-17 (×6): qty 1

## 2015-08-17 MED ORDER — BACLOFEN 10 MG PO TABS
5.0000 mg | ORAL_TABLET | Freq: Three times a day (TID) | ORAL | Status: AC
  Administered 2015-08-17 (×3): 5 mg via ORAL
  Filled 2015-08-17 (×3): qty 1

## 2015-08-17 MED ORDER — MAGNESIUM OXIDE 400 (241.3 MG) MG PO TABS
200.0000 mg | ORAL_TABLET | Freq: Two times a day (BID) | ORAL | Status: DC
  Administered 2015-08-17 – 2015-08-23 (×12): 200 mg via ORAL
  Filled 2015-08-17 (×14): qty 1

## 2015-08-17 MED ORDER — SODIUM CHLORIDE 0.9% FLUSH
10.0000 mL | Freq: Two times a day (BID) | INTRAVENOUS | Status: DC
Start: 2015-08-17 — End: 2015-09-03
  Administered 2015-08-17 – 2015-09-03 (×9): 10 mL

## 2015-08-17 NOTE — Progress Notes (Signed)
Patient unable to tolerated MRI without general anesthesia.  Will plan for repeat I&D tomorrow and VAC. NPO after midnight.  Mayra Reel, MD [facility] [phone] 6:53 PM

## 2015-08-17 NOTE — H&P (Signed)
H&P update  The surgical history has been reviewed and remains accurate without interval change.  The patient was re-examined and patient's physiologic condition has not changed significantly in the last 30 days. The condition still exists that makes this procedure necessary. The treatment plan remains the same, without new options for care.  No new pharmacological allergies or types of therapy has been initiated that would change the plan or the appropriateness of the plan.  The patient and/or family understand the potential benefits and risks.  Mayra Reel, MD 08/17/2015 6:53 PM

## 2015-08-17 NOTE — Progress Notes (Addendum)
iVAC taken off and mepilex dressing applied. Continues to have mild serosanguinous drainage from mainly top incision. Will monitor this. Will be on vanc for prolonged course. Stable from ortho stand point. If drainage does not cease, may need repeat I&D and reapplication of VAC and allow for healing by secondary intention. MRI pelvis ordered to better localize and characterize gluteal abscess.   I have tentatively posted patient for I&D tomorrow  N. Glee Arvin, MD [facility] [phone] 8:17 AM

## 2015-08-17 NOTE — Progress Notes (Signed)
Physical Therapy Treatment Patient Details Name: Jeff Ferrell MRN: [id number] DOB: 1951/06/03 Today's Date: 08/17/2015    History of Present Illness Pt is a 65 y/o M w/ recent fx of Rt femoral neck w/ repair on 07/21/15 who presents w/ wound infection, now s/p I&D.  PT w/ MRSA UTI and tachycardia.  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    PT Comments    Very motivated to move; bil LE spasms continue to limit activity tolerance; Noted for surgery tomorrow;   Worth considering a [employer] cushion to help Mr. Schulte to tolerate sitting OOB   Follow Up Recommendations  SNF;Supervision/Assistance - 24 hour     Equipment Recommendations  None recommended by PT    Recommendations for Other Services       Precautions / Restrictions Precautions Precautions: Fall Precaution Comments: Wound vac; Bil LE extreme spasms with bil knee flexion maintained throughout, R>L Restrictions RLE Weight Bearing: Non weight bearing LLE Weight Bearing: Weight bearing as tolerated    Mobility  Bed Mobility Overal bed mobility: Needs Assistance Bed Mobility: Rolling;Supine to Sit;Sit to Supine Rolling: Min assist Sidelying to sit: Mod assist;+2 for physical assistance   Sit to supine: +2 for physical assistance;Mod assist   General bed mobility comments: Assist managing Bil LEs, pt w/ heavy use of bed rail and requires assist to elevate trunk.    Transfers Overall transfer level: Needs assistance Equipment used: Rolling walker (2 wheeled) Transfers: Sit to/from Stand Sit to Stand: Max assist;+2 physical assistance;+2 safety/equipment         General transfer comment: Cues for prep, hand placement, and to only stand on LLE; good effort at anterior translation and push; heavy bilateral assist provided at gait belt and shoulder girdles; Unable to get LLE fully straight for standing  Ambulation/Gait                 Stairs            Wheelchair Mobility    Modified Rankin (Stroke Patients Only)       Balance             Standing balance-Leahy Scale: Zero                      Cognition Arousal/Alertness: Awake/alert Behavior During Therapy: WFL for tasks assessed/performed Overall Cognitive Status: Within Functional Limits for tasks assessed                      Exercises General Exercises - Lower Extremity Heel Slides: PROM;Right;Left;5 reps (Stretching) Heel Slides Limitations: able to perform through partial range    General Comments        Pertinent Vitals/Pain Pain Assessment: 0-10 Pain Score: 9  Pain Location: Bil LEs, R worse than L, with spasm Pain Descriptors / Indicators: Aching;Grimacing;Spasm Pain Intervention(s): Limited activity within patient's tolerance;Monitored during session;Other (comment) (Pt requesting we keep working despite what sounds or groans he makes)    Home Living                      Prior Function            PT Goals (current goals can now be found in the care plan section) Acute Rehab PT Goals Patient Stated Goal: be able to stand PT Goal Formulation: With patient Time For Goal Achievement: 08/27/15 Potential to Achieve Goals: Fair Progress towards PT goals: Progressing  toward goals    Frequency  Min 3X/week    PT Plan Current plan remains appropriate    Co-evaluation             End of Session Equipment Utilized During Treatment: Gait belt Activity Tolerance: Patient tolerated treatment well Patient left: in bed;with call bell/phone within reach     Time: 1326-1407 PT Time Calculation (min) (ACUTE ONLY): 41 min  Charges:  $Therapeutic Activity: 38-52 mins                    G Codes:      Olen Pel 08/17/2015, 3:18 PM  Van Clines, [place]  Acute Rehabilitation Services Pager [phone] Office [phone]

## 2015-08-17 NOTE — Progress Notes (Signed)
Pharmacy Antibiotic Note  Jeff Ferrell is a 65 y.o. male admitted on 08/07/2015 with MRSA bacteremia, R hip osteo, and multiple abscesses concerning for infection.  Pharmacy has been consulted for Vancomycin dosing.  Today is day #11 of therapy.    Vanc regimen adjusted on 2/25 when trough was elevated at 25 (goal 15-20).   Trough level today is 18 mg/ml on 1500 mg IV q12hrs, at goal. Renal function stable.  Also on Rifampin 600 mh PO daily.  Expecting 8 weeks of Vanc and Rifampin, then 10 months of oral suppressive antibiotics.   May need I& again tomorrow.  Plan:  Continue Vancomycin 1500 mg IV q12hrs (10am-10pm)  Target vancomycin troughs 15-20 mcg/ml.  Vanc trough level at least weekly; bmet at least every 3 days while in patient.  Will follow up plans and progress.   Height:  (175.3 cm) Weight: 161 lb 2.5 oz (73.1 kg) IBW/kg (Calculated) : 70.7  Temp (24hrs), Avg:98.5 F (36.9 C), Min:98.3 F (36.8 C), Max:98.7 F (37.1 C)   Recent Labs Lab 08/13/15 0518 08/14/15 0221 08/14/15 1252 08/15/15 0422 08/16/15 0312 08/17/15 0617 08/17/15 0952  WBC 6.8 7.7  --  6.5 7.5 8.6  --   CREATININE 0.35* 0.49*  --  0.62 0.53* 0.49*  --   VANCOTROUGH  --   --  25*  --   --   --  18    Estimated Creatinine Clearance: 92.1 mL/min (by C-G formula based on Cr of 0.49).    Allergies  Allergen Reactions  . Penicillins Hives and Rash    Has patient had a PCN reaction causing immediate rash, facial/tongue/throat swelling, SOB or lightheadedness with hypotension: Yes Has patient had a PCN reaction causing severe rash involving mucus membranes or skin necrosis: Yes   Has patient had a PCN reaction that required hospitalization No Has patient had a PCN reaction occurring within the last 10 years: No If all of the above answers are "NO", then may proceed with Cephalosporin use.     Antimicrobials this admission: Azactam 2/18 >>2/19 Rocephin 2/19 >> 2/20 Vancomycin  2/18>> Rifampin PO 2/19>>  Dose adjustments this admission: 2/21: VT = 7 on 1gm q12h, dose increased to 1gm q8h 2/22: VT = 14 on 1gm q8h, dose increased to  q8h 2/25: VT = 25 (1hr early) on 1250 q8h, dose changed to  q12h 2/28: VT = 18 (on time) on 1500 mg q12h  Microbiology results: 2/18 blood x 2 - MRSA (S: Clinda, Rifampin, TCN, Vanc, MIC = 1, R: Septra, CIpro) 2/18 R leg wound - abundant MRSA 2/19 R leg wound - MRSA 2/19 urine -100k/ml MRSA 2/19 blood x 2 - 1/2 MRSA 2/19 R leg wound, anaerobic x 2 - no anaerobes isolated 2/19 MRSA PCR- POS -> CHG/Bactroban 2/19-20>>2/24 2/20 Blood x 2 - negative 2/24 left scapula abscess - negative   Jeff Ferrell, [place] Pager: [phone] 08/17/2015 11:48 AM

## 2015-08-17 NOTE — Progress Notes (Signed)
[facility] PROGRESS NOTE  Jeff Ferrell ZOX:[id number] DOB: [birth year] DOA: 08/07/2015 PCP: [name], MD  Assessment/Plan:  Sepsis w/ MRSA Pyelonephritis - MRSA bacteremia - infected R hip surgical wound -taken to the OR for I&D 2/19 - care per ID and Ortho - TEE negative for valvular vegetation - 2/20 blood cultures 2/2 negative > PICC placed today for long term abx  Left chest wall Phlegmon / Abscess -2/24/ S/P IR aspiration  R gluteal muscle fluid collection  -Orthopedics following, might need incision and drainage of the fluid collection.  Muscle spasms bilateral lower extremity -Patient currently on Valium and Skelaxin. Still having muscle spasm - We'll start baclofen 5 mg 3 times a day 3 doses, if he improves with baclofen consider starting baclofen from a.m.  DM2 -CBG currently controlled - follow w/ SSI - A1c 5.7 on 2/21  Chronic Hyponatremia -Workup accomplished during last hospital stay concluded hypovolemia was etiology - stable at present - follow   Acute urinary retention  -was a problem during his initial hospitalization for hip surgery - now recurring and requiring foley placement - cont antispasmotics and follow - begin voiding trials once LE spasms better controlled   Normocytic anemia  -transfuse for Hgb < 7.0 - stable at present   Code Status: FULL Family Communication: no family present at time of exam Disposition Plan: Pending resolution of multiple medical problems as above  Consultants: ID Ortho IR  Procedures: 2/19 - I&D infected R hip wound  2/23 MRI T-spine/L-spine;-Massively distended urinary bladder, causing 2dary Hydronephrosis. -No evidence of acute thoracic or lumbar spinal infection. -Rim enhancing fluid collections in the posterior left chest wall fluid - associated with multiple displaced rib fractures - and in the right gluteal muscle fluid collection are suspicious for phlegmon/abscess - Lt >>Rt pleural effusions  with bilateral lower lobe pneumonia. -Degenerative thoracic spinal stenosis Stable degenerative lumbar spinal stenosis; moderate degenerative cervical spinal stenosis  2/24 TEE EF= 60% to 65%. WMN - negative valvular thrombus - Pericardium, extracardiac: A trivial pericardial effusion  2/24Lt scapula fluid aspiration;3 cc serosanguinous fluid 2/27 - L UE PICC in IR   Code Status: Full code Family Communication: No family at bedside Disposition Plan: Skilled nurse facility once medically stable   Consultants:  ID  Orthopedics  Interventional radiology  Procedures: *2/19 - I&D infected R hip wound  2/23 MRI T-spine/L-spine;-Massively distended urinary bladder, causing 2dary Hydronephrosis. -No evidence of acute thoracic or lumbar spinal infection. -Rim enhancing fluid collections in the posterior left chest wall fluid - associated with multiple displaced rib fractures - and in the right gluteal muscle fluid collection are suspicious for phlegmon/abscess - Lt >>Rt pleural effusions with bilateral lower lobe pneumonia. -Degenerative thoracic spinal stenosis Stable degenerative lumbar spinal stenosis; moderate degenerative cervical spinal stenosis  2/24 TEE EF= 60% to 65%. WMN - negative valvular thrombus - Pericardium, extracardiac: A trivial pericardial effusion  2/24Lt scapula fluid aspiration;3 cc serosanguinous fluid  2/27 - L UE PICC in IR   Antibiotics: Aztreonam 2/18 Vancomycin 2/18 > Ceftriaxone 2/19 > Rifampin 2/19 >   HPI/Subjective: 65 y.o. male with DM not on insulin, chronic hyponatremia, and recent fracture of the right femoral neck with repair on 2/1 who presented w/ drainage from the wound. He was noted to be tachycardic with a fever of 99 and suspicion of a wound infection. He was also noted to have a UA positive for pyuria.   this morning patient complains of leg spasm. He has been started on Valium, Skelaxin  but still having spasm in the right lower  extremity Objective: Filed Vitals:   08/17/15 0600 08/17/15 1300  BP: 117/70 123/68  Pulse: 82 84  Temp: 98.6 F (37 C)   Resp: 20 20    Intake/Output Summary (Last 24 hours) at 08/17/15 1708 Last data filed at 08/17/15 1500  Gross per 24 hour  Intake 3232.5 ml  Output   3300 ml  Net  -67.5 ml   Filed Weights   08/09/15 0255  Weight: 73.1 kg (161 lb 2.5 oz)    Exam:   General:  Appears in moderate distress due to the lack spasm  Cardiovascular: S1-S2 normal, regular rhythm  Respiratory: Clear to auscultation bilaterally, no wheezing or crackles  Abdomen: Soft, nontender, no organomegaly.  Musculoskeletal: No cyanosis/clubbing/edema of the lower extremities. Tenderness noted in right upper thigh with muscle spasm   Data Reviewed: Basic Metabolic Panel:  Recent Labs Lab 08/13/15 0518 08/14/15 0221 08/15/15 0422 08/16/15 0312 08/17/15 0617  NA 130* 130* 137 134* 136  K 3.6 3.8 4.2 3.8 4.0  CL 95* 98* 102 99* 101  CO2 26 24 25 26 25   GLUCOSE 118* 129* 118* 111* 113*  BUN <5* <5* <5* <5* <5*  CREATININE 0.35* 0.49* 0.62 0.53* 0.49*  CALCIUM 8.4* 8.0* 8.3* 8.5* 8.7*  MG 1.4* 1.8 1.7 1.6* 1.8   Liver Function Tests:  Recent Labs Lab 08/13/15 0518 08/14/15 0221 08/15/15 0422 08/16/15 0312 08/17/15 0617  AST 21 27 26 20 20   ALT 9* 13* 11* 11* 10*  ALKPHOS 177* 208* 197* 205* 186*  BILITOT 0.5 0.6 0.7 0.7 0.2*  PROT 5.5* 5.9* 5.8* 6.3* 6.2*  ALBUMIN 2.2* 2.2* 2.2* 2.3* 2.2*   No results for input(s): LIPASE, AMYLASE in the last 168 hours. No results for input(s): AMMONIA in the last 168 hours. CBC:  Recent Labs Lab 08/12/15 0540 08/13/15 0518 08/14/15 0221 08/15/15 0422 08/16/15 0312 08/17/15 0617  WBC 7.3 6.8 7.7 6.5 7.5 8.6  NEUTROABS 4.7 4.6 5.4 4.7 5.4  --   HGB 7.7* 7.8* 7.9* 8.1* 8.3* 7.9*  HCT 22.8* 23.7* 24.2* 26.1* 27.0* 24.3*  MCV 77.6* 79.3 77.6* 78.6 79.4 78.6  PLT 346 388 452* 441* 564* 538*   BNP (last 3 results)  Recent  Labs  06/11/15 1106 07/02/15 0538  BNP 27.7 34.0    ProBNP (last 3 results) No results for input(s): PROBNP in the last 8760 hours.  CBG:  Recent Labs Lab 08/16/15 1058 08/16/15 1522 08/16/15 2121 08/17/15 0631 08/17/15 1153  GLUCAP 86 115* 167* 105* 120*    Recent Results (from the past 240 hour(s))  MRSA PCR Screening     Status: Abnormal   Collection Time: 08/08/15  5:32 AM  Result Value Ref Range Status   MRSA by PCR POSITIVE (A) NEGATIVE Final    Comment:        The GeneXpert MRSA Assay (FDA approved for NASAL specimens only), is one component of a comprehensive MRSA colonization surveillance program. It is not intended to diagnose MRSA infection nor to guide or monitor treatment for MRSA infections. RESULT CALLED TO, READ BACK BY AND VERIFIED WITH: RN GORDON,R AT 1002 [id number] MARTINB   Anaerobic culture     Status: None   Collection Time: 08/08/15  8:28 AM  Result Value Ref Range Status   Specimen Description WOUND RIGHT LEG  Final   Special Requests POF VANCOMYCIN LOWER RIGHT LEG INCISION PART A   Final   Gram Stain  Final    ABUNDANT WBC PRESENT, PREDOMINANTLY PMN NO SQUAMOUS EPITHELIAL CELLS SEEN FEW GRAM POSITIVE COCCI IN CLUSTERS IN PAIRS Performed at [facility]    Culture   Final    NO ANAEROBES ISOLATED Performed at [facility]    Report Status 08/13/2015 FINAL  Final  Wound culture     Status: None   Collection Time: 08/08/15  8:28 AM  Result Value Ref Range Status   Specimen Description WOUND RIGHT LEG  Final   Special Requests POF VANCOMYCIN RIGHT LOWER LEG INCISION PART A  Final   Gram Stain   Final    FEW WBC PRESENT, PREDOMINANTLY PMN NO SQUAMOUS EPITHELIAL CELLS SEEN RARE GRAM POSITIVE COCCI IN PAIRS Performed at [facility]    Culture   Final    MODERATE METHICILLIN RESISTANT STAPHYLOCOCCUS AUREUS Note: RIFAMPIN AND GENTAMICIN SHOULD NOT BE USED AS SINGLE DRUGS FOR TREATMENT OF STAPH INFECTIONS.  This organism DOES NOT demonstrate inducible Clindamycin resistance in vitro. CRITICAL RESULT CALLED TO, READ BACK BY AND VERIFIED WITH: CENLINA S 2/22 @  0920 BY REAMM Performed at [facility]    Report Status 08/11/2015 FINAL  Final   Organism ID, Bacteria METHICILLIN RESISTANT STAPHYLOCOCCUS AUREUS  Final      Susceptibility   Methicillin resistant staphylococcus aureus - MIC*    CLINDAMYCIN <=0.25 SENSITIVE Sensitive     ERYTHROMYCIN >=8 RESISTANT Resistant     GENTAMICIN <=0.5 SENSITIVE Sensitive     LEVOFLOXACIN >=8 RESISTANT Resistant     OXACILLIN >=4 RESISTANT Resistant     RIFAMPIN <=0.5 SENSITIVE Sensitive     TRIMETH/SULFA >=320 RESISTANT Resistant     VANCOMYCIN 1 SENSITIVE Sensitive     TETRACYCLINE <=1 SENSITIVE Sensitive     * MODERATE METHICILLIN RESISTANT STAPHYLOCOCCUS AUREUS  Anaerobic culture     Status: None   Collection Time: 08/08/15  8:49 AM  Result Value Ref Range Status   Specimen Description WOUND RIGHT LEG  Final   Special Requests POF VANCOMYCIN PROXIMAL RIGHT LOWER LEG PART B  Final   Gram Stain   Final    ABUNDANT WBC PRESENT, PREDOMINANTLY PMN NO SQUAMOUS EPITHELIAL CELLS SEEN FEW GRAM POSITIVE COCCI IN PAIRS IN CLUSTERS Performed at [facility]    Culture   Final    NO ANAEROBES ISOLATED Performed at [facility]    Report Status 08/13/2015 FINAL  Final  Wound culture     Status: None   Collection Time: 08/08/15  8:49 AM  Result Value Ref Range Status   Specimen Description WOUND RIGHT LEG  Final   Special Requests POF VANCOMYCIN PROXIMAL RIGHT LEG INCISON PART B  Final   Gram Stain   Final    FEW WBC PRESENT, PREDOMINANTLY PMN NO SQUAMOUS EPITHELIAL CELLS SEEN FEW GRAM POSITIVE COCCI IN PAIRS IN CLUSTERS Performed at [facility]    Culture   Final    MODERATE METHICILLIN RESISTANT STAPHYLOCOCCUS AUREUS Note: RIFAMPIN AND GENTAMICIN SHOULD NOT BE USED AS SINGLE DRUGS FOR TREATMENT OF STAPH  INFECTIONS. This organism DOES NOT demonstrate inducible Clindamycin resistance in vitro. CRITICAL RESULT CALLED TO, READ BACK BY AND VERIFIED WITH: CENLINA S 2/22 @  0920 BY REAMM Performed at [facility]    Report Status 08/11/2015 FINAL  Final   Organism ID, Bacteria METHICILLIN RESISTANT STAPHYLOCOCCUS AUREUS  Final      Susceptibility   Methicillin resistant staphylococcus aureus - MIC*    CLINDAMYCIN <=0.25  SENSITIVE Sensitive     ERYTHROMYCIN >=8 RESISTANT Resistant     GENTAMICIN <=0.5 SENSITIVE Sensitive     LEVOFLOXACIN >=8 RESISTANT Resistant     OXACILLIN >=4 RESISTANT Resistant     RIFAMPIN <=0.5 SENSITIVE Sensitive     TRIMETH/SULFA >=320 RESISTANT Resistant     VANCOMYCIN 1 SENSITIVE Sensitive     TETRACYCLINE <=1 SENSITIVE Sensitive     * MODERATE METHICILLIN RESISTANT STAPHYLOCOCCUS AUREUS  Culture, blood (Routine X 2) w Reflex to ID Panel     Status: None   Collection Time: 08/08/15 12:40 PM  Result Value Ref Range Status   Specimen Description BLOOD RIGHT ANTECUBITAL  Final   Special Requests IN PEDIATRIC BOTTLE 2CC  Final   Culture  Setup Time   Final    GRAM POSITIVE COCCI IN CLUSTERS PEDIATRIC BOTTLE CRITICAL RESULT CALLED TO, READ BACK BY AND VERIFIED WITH: W DAVIS,RN AT 1043 08/09/15 BY L BENFIELD    Culture METHICILLIN RESISTANT STAPHYLOCOCCUS AUREUS  Final   Report Status 08/11/2015 FINAL  Final   Organism ID, Bacteria METHICILLIN RESISTANT STAPHYLOCOCCUS AUREUS  Final      Susceptibility   Methicillin resistant staphylococcus aureus - MIC*    CIPROFLOXACIN >=8 RESISTANT Resistant     ERYTHROMYCIN >=8 RESISTANT Resistant     GENTAMICIN <=0.5 SENSITIVE Sensitive     OXACILLIN >=4 RESISTANT Resistant     TETRACYCLINE <=1 SENSITIVE Sensitive     VANCOMYCIN 1 SENSITIVE Sensitive     TRIMETH/SULFA >=320 RESISTANT Resistant     CLINDAMYCIN <=0.25 SENSITIVE Sensitive     RIFAMPIN <=0.5 SENSITIVE Sensitive     Inducible Clindamycin NEGATIVE  Sensitive     * METHICILLIN RESISTANT STAPHYLOCOCCUS AUREUS  Culture, blood (Routine X 2) w Reflex to ID Panel     Status: None   Collection Time: 08/08/15 12:45 PM  Result Value Ref Range Status   Specimen Description BLOOD RIGHT ARM  Final   Special Requests BOTTLES DRAWN AEROBIC ONLY  5CC  Final   Culture NO GROWTH 5 DAYS  Final   Report Status 08/13/2015 FINAL  Final  Culture, blood (Routine X 2) w Reflex to ID Panel     Status: None   Collection Time: 08/09/15 12:09 PM  Result Value Ref Range Status   Specimen Description BLOOD BLOOD RIGHT FOREARM  Final   Special Requests IN PEDIATRIC BOTTLE .3CC  Final   Culture NO GROWTH 5 DAYS  Final   Report Status 08/14/2015 FINAL  Final  Culture, blood (Routine X 2) w Reflex to ID Panel     Status: None   Collection Time: 08/09/15 12:15 PM  Result Value Ref Range Status   Specimen Description BLOOD RIGHT ANTECUBITAL  Final   Special Requests BOTTLES DRAWN AEROBIC ONLY 6CC  Final   Culture NO GROWTH 5 DAYS  Final   Report Status 08/14/2015 FINAL  Final  Culture, routine-abscess     Status: None   Collection Time: 08/13/15 12:16 PM  Result Value Ref Range Status   Specimen Description OTHER  Final   Special Requests LEFT SCAPULA  Final   Gram Stain   Final    RARE WBC NO SQUAMOUS EPITHELIAL CELLS SEEN NO ORGANISMS SEEN Performed at [facility]    Culture   Final    NO GROWTH 2 DAYS Performed at [facility]    Report Status 08/15/2015 FINAL  Final  C difficile quick scan w PCR reflex     Status: None  Collection Time: 08/17/15  1:47 PM  Result Value Ref Range Status   C Diff antigen NEGATIVE NEGATIVE Final   C Diff toxin NEGATIVE NEGATIVE Final   C Diff interpretation Negative for toxigenic C. difficile  Final     Studies: Dg Chest 2 View  08/15/2015  CLINICAL DATA:  Check for possible effusion EXAM: CHEST  2 VIEW COMPARISON:  08/14/2015 FINDINGS: Cardiac shadow is within normal limits. The lungs are  well-aerated bilaterally. Small pleural effusions are noted on the lateral projection likely too small for significant thoracentesis. No focal infiltrate is noted. IMPRESSION: Small pleural effusions bilaterally likely too small for adequate thoracentesis. No focal infiltrate is noted. Electronically Signed   By: Alcide Clever M.D.   On: 08/15/2015 20:01   Ir Veno/ext/uni Right  08/16/2015  INDICATION: DIABETIC FOOT ULCER, ACCESS FOR LONG-TERM ANTIBIOTICS EXAM: ULTRASOUND GUIDANCE FOR VASCULAR ACCESS RIGHT UPPER EXTREMITY VENOGRAM LEFT UPPER EXTREMITY PICC LINE PLACEMENT WITH ULTRASOUND AND FLUOROSCOPIC GUIDANCE MEDICATIONS: 1% LIDOCAINE LOCALLY ANESTHESIA/SEDATION: NONE. The patient's level of consciousness and vital signs were monitored continuously by radiology nursing throughout the procedure under my direct supervision. FLUOROSCOPY TIME:  Fluoroscopy Time: 3 minutes 30 seconds (17 mGy). COMPLICATIONS: None immediate. PROCEDURE: The patient was advised of the possible risks and complications and agreed to undergo the procedure. The patient was then brought to the angiographic suite for the procedure. Ultrasound guidance for vascular access and right upper extremity venogram: Initially under sterile conditions and local anesthesia, right basilic micropuncture venous access performed with ultrasound. Images were obtained for documentation. Basilic vein was demonstrated to be patent. Guidewire would not advance centrally for accurate measurements for a PICC line length. Five French sheath inserted. Contrast injection performed for central right upper extremity venogram. Right upper extremity venogram: Right brachial and axillary veins are patent. 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 Jugular collaterals are present opacifying the patent right innominate vein and SVC. Several attempts were made to cross the right subclavian venous occlusion with a Kumpe  catheter and guidewire however this was unsuccessful. Therefore this access was aborted for PICC line placement. Left arm will be utilized. Left upper extremity PICC line insertion with ultrasound guidance: The left arm was prepped with chlorhexidine, draped in the usual sterile fashion using maximum barrier technique (cap and mask, sterile gown, sterile gloves, large sterile sheet, hand hygiene and cutaneous antisepsis) and infiltrated locally with 1% Lidocaine. Ultrasound demonstrated patency of the left basilic vein, and this was documented with an image. Under real-time ultrasound guidance, this vein was accessed with a 21 gauge micropuncture needle and image documentation was performed. A 0.018 wire was introduced in to the vein. Over this, a 5 [place] single lumen power PICC was advanced to the lower SVC/right atrial junction. Fluoroscopy during the procedure and fluoro spot radiograph confirms appropriate catheter position. The catheter was flushed and covered with asterile dressing. Catheter length: None immediate IMPRESSION: Initial attempts at placement of a right PICC line were unsuccessful related to difficulty pass the guidewire. 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 Successful left arm power PICC line placement with ultrasound and fluoroscopic guidance. The catheter is ready for use. Electronically Signed   By: Judie Petit.  Shick M.D.   On: 08/16/2015 17:24   Ir Fluoro Guide Cv Line Left  08/16/2015  INDICATION: DIABETIC FOOT ULCER, ACCESS FOR LONG-TERM ANTIBIOTICS EXAM: ULTRASOUND GUIDANCE FOR VASCULAR ACCESS RIGHT UPPER EXTREMITY VENOGRAM LEFT UPPER  EXTREMITY PICC LINE PLACEMENT WITH ULTRASOUND AND FLUOROSCOPIC GUIDANCE MEDICATIONS: 1% LIDOCAINE LOCALLY ANESTHESIA/SEDATION: NONE. The patient's level of consciousness and vital signs were monitored continuously by radiology nursing throughout the procedure under my direct supervision. FLUOROSCOPY TIME:   Fluoroscopy Time: 3 minutes 30 seconds (17 mGy). COMPLICATIONS: None immediate. PROCEDURE: The patient was advised of the possible risks and complications and agreed to undergo the procedure. The patient was then brought to the angiographic suite for the procedure. Ultrasound guidance for vascular access and right upper extremity venogram: Initially under sterile conditions and local anesthesia, right basilic micropuncture venous access performed with ultrasound. Images were obtained for documentation. Basilic vein was demonstrated to be patent. Guidewire would not advance centrally for accurate measurements for a PICC line length. Five French sheath inserted. Contrast injection performed for central right upper extremity venogram. Right upper extremity venogram: Right brachial and axillary veins are patent. 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 Jugular collaterals are present opacifying the patent right innominate vein and SVC. Several attempts were made to cross the right subclavian venous occlusion with a Kumpe catheter and guidewire however this was unsuccessful. Therefore this access was aborted for PICC line placement. Left arm will be utilized. Left upper extremity PICC line insertion with ultrasound guidance: The left arm was prepped with chlorhexidine, draped in the usual sterile fashion using maximum barrier technique (cap and mask, sterile gown, sterile gloves, large sterile sheet, hand hygiene and cutaneous antisepsis) and infiltrated locally with 1% Lidocaine. Ultrasound demonstrated patency of the left basilic vein, and this was documented with an image. Under real-time ultrasound guidance, this vein was accessed with a 21 gauge micropuncture needle and image documentation was performed. A 0.018 wire was introduced in to the vein. Over this, a 5 [place] single lumen power PICC was advanced to the lower SVC/right atrial junction. Fluoroscopy during  the procedure and fluoro spot radiograph confirms appropriate catheter position. The catheter was flushed and covered with asterile dressing. Catheter length: None immediate IMPRESSION: Initial attempts at placement of a right PICC line were unsuccessful related to difficulty pass the guidewire. 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 Successful left arm power PICC line placement with ultrasound and fluoroscopic guidance. The catheter is ready for use. Electronically Signed   By: Judie Petit.  Shick M.D.   On: 08/16/2015 17:24   Ir US Guide Vasc Access Left  08/16/2015  INDICATION: DIABETIC FOOT ULCER, ACCESS FOR LONG-TERM ANTIBIOTICS EXAM: ULTRASOUND GUIDANCE FOR VASCULAR ACCESS RIGHT UPPER EXTREMITY VENOGRAM LEFT UPPER EXTREMITY PICC LINE PLACEMENT WITH ULTRASOUND AND FLUOROSCOPIC GUIDANCE MEDICATIONS: 1% LIDOCAINE LOCALLY ANESTHESIA/SEDATION: NONE. The patient's level of consciousness and vital signs were monitored continuously by radiology nursing throughout the procedure under my direct supervision. FLUOROSCOPY TIME:  Fluoroscopy Time: 3 minutes 30 seconds (17 mGy). COMPLICATIONS: None immediate. PROCEDURE: The patient was advised of the possible risks and complications and agreed to undergo the procedure. The patient was then brought to the angiographic suite for the procedure. Ultrasound guidance for vascular access and right upper extremity venogram: Initially under sterile conditions and local anesthesia, right basilic micropuncture venous access performed with ultrasound. Images were obtained for documentation. Basilic vein was demonstrated to be patent. Guidewire would not advance centrally for accurate measurements for a PICC line length. Five French sheath inserted. Contrast injection performed for central right upper extremity venogram. Right upper extremity venogram: Right brachial and axillary veins are patent. Chronic occlusion of the  right subclavian  vein centrally at the right clavicle and first rib. Jugular collaterals are present opacifying the patent right innominate vein and SVC. Several attempts were made to cross the right subclavian venous occlusion with a Kumpe catheter and guidewire however this was unsuccessful. Therefore this access was aborted for PICC line placement. Left arm will be utilized. Left upper extremity PICC line insertion with ultrasound guidance: The left arm was prepped with chlorhexidine, draped in the usual sterile fashion using maximum barrier technique (cap and mask, sterile gown, sterile gloves, large sterile sheet, hand hygiene and cutaneous antisepsis) and infiltrated locally with 1% Lidocaine. Ultrasound demonstrated patency of the left basilic vein, and this was documented with an image. Under real-time ultrasound guidance, this vein was accessed with a 21 gauge micropuncture needle and image documentation was performed. A 0.018 wire was introduced in to the vein. Over this, a 5 [place] single lumen power PICC was advanced to the lower SVC/right atrial junction. Fluoroscopy during the procedure and fluoro spot radiograph confirms appropriate catheter position. The catheter was flushed and covered with asterile dressing. Catheter length: None immediate IMPRESSION: Initial attempts at placement of a right PICC line were unsuccessful related to difficulty pass the guidewire. 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 Successful left arm power PICC line placement with ultrasound and fluoroscopic guidance. The catheter is ready for use. Electronically Signed   By: Judie Petit.  Shick M.D.   On: 08/16/2015 17:24   Ir US Guide Vasc Access Right  08/16/2015  INDICATION: DIABETIC FOOT ULCER, ACCESS FOR LONG-TERM ANTIBIOTICS EXAM: ULTRASOUND GUIDANCE FOR VASCULAR ACCESS RIGHT UPPER EXTREMITY VENOGRAM LEFT UPPER EXTREMITY PICC LINE PLACEMENT WITH ULTRASOUND AND FLUOROSCOPIC GUIDANCE  MEDICATIONS: 1% LIDOCAINE LOCALLY ANESTHESIA/SEDATION: NONE. The patient's level of consciousness and vital signs were monitored continuously by radiology nursing throughout the procedure under my direct supervision. FLUOROSCOPY TIME:  Fluoroscopy Time: 3 minutes 30 seconds (17 mGy). COMPLICATIONS: None immediate. PROCEDURE: The patient was advised of the possible risks and complications and agreed to undergo the procedure. The patient was then brought to the angiographic suite for the procedure. Ultrasound guidance for vascular access and right upper extremity venogram: Initially under sterile conditions and local anesthesia, right basilic micropuncture venous access performed with ultrasound. Images were obtained for documentation. Basilic vein was demonstrated to be patent. Guidewire would not advance centrally for accurate measurements for a PICC line length. Five French sheath inserted. Contrast injection performed for central right upper extremity venogram. Right upper extremity venogram: Right brachial and axillary veins are patent. 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 Jugular collaterals are present opacifying the patent right innominate vein and SVC. Several attempts were made to cross the right subclavian venous occlusion with a Kumpe catheter and guidewire however this was unsuccessful. Therefore this access was aborted for PICC line placement. Left arm will be utilized. Left upper extremity PICC line insertion with ultrasound guidance: The left arm was prepped with chlorhexidine, draped in the usual sterile fashion using maximum barrier technique (cap and mask, sterile gown, sterile gloves, large sterile sheet, hand hygiene and cutaneous antisepsis) and infiltrated locally with 1% Lidocaine. Ultrasound demonstrated patency of the left basilic vein, and this was documented with an image. Under real-time ultrasound guidance, this vein was accessed  with a 21 gauge micropuncture needle and image documentation was performed. A 0.018 wire was introduced in to the vein. Over this, a 5 [place] single lumen power  PICC was advanced to the lower SVC/right atrial junction. Fluoroscopy during the procedure and fluoro spot radiograph confirms appropriate catheter position. The catheter was flushed and covered with asterile dressing. Catheter length: None immediate IMPRESSION: Initial attempts at placement of a right PICC line were unsuccessful related to difficulty pass the guidewire. 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 Successful left arm power PICC line placement with ultrasound and fluoroscopic guidance. The catheter is ready for use. Electronically Signed   By: Judie Petit.  Shick M.D.   On: 08/16/2015 17:24    Scheduled Meds: . aspirin EC  325 mg Oral Daily  . baclofen  5 mg Oral TID  . diazepam  5 mg Oral QID  . docusate sodium  100 mg Oral BID  . feeding supplement (ENSURE ENLIVE)  237 mL Oral TID BM  . gabapentin  300 mg Oral TID  . insulin aspart  0-5 Units Subcutaneous QHS  . insulin aspart  0-9 Units Subcutaneous TID WC  . magnesium oxide  200 mg Oral BID  . metaxalone  800 mg Oral QID  . rifampin  600 mg Oral Daily  . sodium chloride flush  10-40 mL Intracatheter Q12H  . tamsulosin  0.4 mg Oral Daily  . vancomycin  1,500 mg Intravenous Q12H   Continuous Infusions: . sodium chloride 50 mL/hr at 08/17/15 1610    Principal Problem:   Sepsis (HCC) Active Problems:   UTI (lower urinary tract infection)   DM type 2, goal HbA1c < 7% (HCC)   Chronic hyponatremia   Infection   Hip fracture requiring operative repair (HCC)   MRSA bacteremia   Staphylococcus aureus bacteremia with sepsis (HCC)   Osteomyelitis of right hip (HCC)   Hardware complicating wound infection (HCC)   Sepsis due to methicillin resistant Staphylococcus aureus (MRSA) (HCC)   Uncontrolled type 2 diabetes mellitus  with complication (HCC)   Acute urinary retention   Low back pain   Muscle spasms of both lower extremities   Controlled diabetes mellitus type 2 with complications (HCC)   Hypokalemia   Acute pyelonephritis   Abscess   Chest wall abscess   Pleural effusion   Gluteal abscess   Diabetic foot ulcer (HCC)    Time spent: 25 min    [facility]  [facility] Pager [phone]. If 7PM-7AM, please contact night-coverage at [email or web address], password [facility] 08/17/2015, 5:08 PM  LOS: 10 days

## 2015-08-17 NOTE — Progress Notes (Signed)
Subjective:  He is worried about doing MRI without proper sedation   Antibiotics:  Anti-infectives    Start     Dose/Rate Route Frequency Ordered Stop   08/16/15 1000  rifampin (RIFADIN) capsule 600 mg     600 mg Oral Daily 08/15/15 1009     08/15/15 1200  rifampin (RIFADIN) capsule 300 mg     300 mg Oral  Once 08/15/15 1009 08/15/15 1324   08/15/15 0600  vancomycin (VANCOCIN) 1,500 mg in sodium chloride 0.9 % 500 mL IVPB  Status:  Discontinued     1,500 mg 250 mL/hr over 120 Minutes Intravenous Every 12 hours 08/14/15 1531 08/14/15 2015   08/14/15 2015  vancomycin (VANCOCIN) 1,500 mg in sodium chloride 0.9 % 500 mL IVPB     1,500 mg 250 mL/hr over 120 Minutes Intravenous Every 12 hours 08/14/15 2015     08/12/15 0200  vancomycin (VANCOCIN) 1,250 mg in sodium chloride 0.9 % 250 mL IVPB  Status:  Discontinued     1,250 mg 166.7 mL/hr over 90 Minutes Intravenous Every 8 hours 08/11/15 1820 08/14/15 1531   08/10/15 1000  vancomycin (VANCOCIN) IVPB 1000 mg/200 mL premix  Status:  Discontinued     1,000 mg 200 mL/hr over 60 Minutes Intravenous Every 8 hours 08/10/15 0634 08/11/15 1820   08/09/15 1200  cefTRIAXone (ROCEPHIN) 2 g in dextrose 5 % 50 mL IVPB  Status:  Discontinued     2 g 100 mL/hr over 30 Minutes Intravenous Every 24 hours 08/09/15 1006 08/09/15 1140   08/08/15 1200  cefTRIAXone (ROCEPHIN) 1 g in dextrose 5 % 50 mL IVPB  Status:  Discontinued     1 g 100 mL/hr over 30 Minutes Intravenous Every 24 hours 08/08/15 1015 08/09/15 1006   08/08/15 1200  rifampin (RIFADIN) capsule 300 mg  Status:  Discontinued     300 mg Oral Daily 08/08/15 1015 08/15/15 1009   08/08/15 0841  vancomycin (VANCOCIN) powder  Status:  Discontinued       As needed 08/08/15 0841 08/08/15 0938   08/08/15 0840  gentamicin (GARAMYCIN) injection  Status:  Discontinued       As needed 08/08/15 0841 08/08/15 0938   08/08/15 0500  vancomycin (VANCOCIN) IVPB 1000 mg/200 mL premix  Status:   Discontinued     1,000 mg 200 mL/hr over 60 Minutes Intravenous Every 12 hours 08/07/15 1552 08/10/15 0634   08/08/15 0100  vancomycin (VANCOCIN) IVPB 1000 mg/200 mL premix  Status:  Discontinued     1,000 mg 200 mL/hr over 60 Minutes Intravenous Every 12 hours 08/07/15 1219 08/07/15 1552   08/07/15 2000  [MAR Hold]  aztreonam (AZACTAM) 2 g in dextrose 5 % 50 mL IVPB  Status:  Discontinued     (MAR Hold since 08/08/15 0752)   2 g 100 mL/hr over 30 Minutes Intravenous 3 times per day 08/07/15 1219 08/08/15 1015   08/07/15 1600  vancomycin (VANCOCIN) 1,500 mg in sodium chloride 0.9 % 500 mL IVPB     1,500 mg 250 mL/hr over 120 Minutes Intravenous  Once 08/07/15 1551 08/07/15 1858   08/07/15 1130  vancomycin (VANCOCIN) 1,500 mg in sodium chloride 0.9 % 500 mL IVPB     1,500 mg 250 mL/hr over 120 Minutes Intravenous  Once 08/07/15 1054 08/07/15 1615   08/07/15 1100  aztreonam (AZACTAM) 2 g in dextrose 5 % 50 mL IVPB     2 g 100 mL/hr over 30  Minutes Intravenous  Once 08/07/15 1054 08/07/15 1248      Medications: Scheduled Meds: . aspirin EC  325 mg Oral Daily  . baclofen  5 mg Oral TID  . diazepam  5 mg Oral QID  . docusate sodium  100 mg Oral BID  . feeding supplement (ENSURE ENLIVE)  237 mL Oral TID BM  . gabapentin  300 mg Oral TID  . insulin aspart  0-5 Units Subcutaneous QHS  . insulin aspart  0-9 Units Subcutaneous TID WC  . magnesium oxide  200 mg Oral BID  . metaxalone  800 mg Oral QID  . rifampin  600 mg Oral Daily  . sodium chloride flush  10-40 mL Intracatheter Q12H  . tamsulosin  0.4 mg Oral Daily  . vancomycin  1,500 mg Intravenous Q12H   Continuous Infusions: . sodium chloride 50 mL/hr at 08/17/15 0841   PRN Meds:.acetaminophen **OR** acetaminophen, alum & mag hydroxide-simeth, flumazenil, morphine injection, naLOXone (NARCAN)  injection, ondansetron **OR** ondansetron (ZOFRAN) IV, oxyCODONE, sodium chloride flush    Objective: Weight change:   Intake/Output  Summary (Last 24 hours) at 08/17/15 1412 Last data filed at 08/17/15 1300  Gross per 24 hour  Intake 3522.5 ml  Output   3750 ml  Net -227.5 ml   Blood pressure 123/68, pulse 84, temperature 98.1 F (36.7 C), temperature source Oral, resp. rate 20, height  (1.753 m), weight 161 lb 2.5 oz (73.1 kg), SpO2 95 %. Temp:  [98.1 F (36.7 C)-98.7 F (37.1 C)] 98.1 F (36.7 C) (02/28 1300) Pulse Rate:  [82-93] 84 (02/28 1300) Resp:  [20-23] 20 (02/28 1300) BP: (117-128)/(63-76) 123/68 mmHg (02/28 1300) SpO2:  [95 %-100 %] 95 % (02/28 0600)  Physical Exam: General: Alert and awake, oriented person and place he does not recall IR procedure yesterday.  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, feet with amputation sites  Neuro: nonfocal  CBC:  CBC Latest Ref Rng 08/17/2015 08/16/2015 08/15/2015  WBC 4.0 - 10.5 K/uL 8.6 7.5 6.5  Hemoglobin 13.0 - 17.0 g/dL 7.9(L) 8.3(L) 8.1(L)  Hematocrit 39.0 - 52.0 % 24.3(L) 27.0(L) 26.1(L)  Platelets 150 - 400 K/uL 538(H) 564(H) 441(H)       BMET  Recent Labs  08/16/15 0312 08/17/15 0617  NA 134* 136  K 3.8 4.0  CL 99* 101  CO2 26 25  GLUCOSE 111* 113*  BUN <5* <5*  CREATININE 0.53* 0.49*  CALCIUM 8.5* 8.7*     Liver Panel   Recent Labs  08/16/15 0312 08/17/15 0617  PROT 6.3* 6.2*  ALBUMIN 2.3* 2.2*  AST 20 20  ALT 11* 10*  ALKPHOS 205* 186*  BILITOT 0.7 0.2*       Sedimentation Rate No results for input(s): ESRSEDRATE in the last 72 hours. C-Reactive Protein No results for input(s): CRP in the last 72 hours.  Micro Results: Recent Results (from the past 720 hour(s))  Surgical pcr screen     Status: Abnormal   Collection Time: 07/19/15  8:55 AM  Result Value Ref Range Status   MRSA, PCR POSITIVE (A) NEGATIVE Final    Comment: RESULT CALLED TO, READ BACK BY AND VERIFIED WITH: AMY FARGO,RN [id number] @ 1417 BY J SCOTTON    Staphylococcus aureus POSITIVE (A) NEGATIVE Final    Comment:         The Xpert SA Assay (FDA approved for NASAL specimens in patients over 65 years of age), is one component of a  comprehensive surveillance program.  Test performance has been validated by [facility] for patients greater than or equal to 2 year old. It is not intended to diagnose infection nor to guide or monitor treatment. RESULT CALLED TO, READ BACK BY AND VERIFIED WITH: AMY FARGO,RN [id number] @ 1417 BY J SCOTTON   Blood Culture (routine x 2)     Status: None   Collection Time: 08/07/15 10:47 AM  Result Value Ref Range Status   Specimen Description LEFT ANTECUBITAL  Final   Special Requests BOTTLES DRAWN AEROBIC AND ANAEROBIC 5CC  Final   Culture  Setup Time   Final    GRAM POSITIVE COCCI IN CLUSTERS IN BOTH AEROBIC AND ANAEROBIC BOTTLES CRITICAL RESULT CALLED TO, READ BACK BY AND VERIFIED WITH: TKayren Eaves RN [phone] GREEN R CONFIRMED BY M. CAMPBELL    Culture METHICILLIN RESISTANT STAPHYLOCOCCUS AUREUS  Final   Report Status 08/10/2015 FINAL  Final   Organism ID, Bacteria METHICILLIN RESISTANT STAPHYLOCOCCUS AUREUS  Final      Susceptibility   Methicillin resistant staphylococcus aureus - MIC*    CIPROFLOXACIN >=8 RESISTANT Resistant     ERYTHROMYCIN >=8 RESISTANT Resistant     GENTAMICIN <=0.5 SENSITIVE Sensitive     OXACILLIN >=4 RESISTANT Resistant     TETRACYCLINE <=1 SENSITIVE Sensitive     VANCOMYCIN <=0.5 SENSITIVE Sensitive     TRIMETH/SULFA >=320 RESISTANT Resistant     CLINDAMYCIN <=0.25 SENSITIVE Sensitive     RIFAMPIN <=0.5 SENSITIVE Sensitive     Inducible Clindamycin NEGATIVE Sensitive     * METHICILLIN RESISTANT STAPHYLOCOCCUS AUREUS  Wound culture     Status: None   Collection Time: 08/07/15 10:53 AM  Result Value Ref Range Status   Specimen Description WOUND RIGHT HIP  Final   Special Requests SKIN DEEP EXCISION   Final   Gram Stain   Final    NO WBC SEEN NO SQUAMOUS EPITHELIAL CELLS SEEN NO ORGANISMS SEEN Performed at [facility]     Culture   Final    ABUNDANT METHICILLIN RESISTANT STAPHYLOCOCCUS AUREUS Note: RIFAMPIN AND GENTAMICIN SHOULD NOT BE USED AS SINGLE DRUGS FOR TREATMENT OF STAPH INFECTIONS. This organism DOES NOT demonstrate inducible Clindamycin resistance in vitro. CRITICAL RESULT CALLED TO, READ BACK BY AND VERIFIED WITH: WHITNEY DAVIS @  9:10 AM 08/10/15 BY DWEEKS Performed at [facility]    Report Status 08/10/2015 FINAL  Final   Organism ID, Bacteria METHICILLIN RESISTANT STAPHYLOCOCCUS AUREUS  Final      Susceptibility   Methicillin resistant staphylococcus aureus - MIC*    CLINDAMYCIN <=0.25 SENSITIVE Sensitive     ERYTHROMYCIN >=8 RESISTANT Resistant     GENTAMICIN <=0.5 SENSITIVE Sensitive     LEVOFLOXACIN >=8 RESISTANT Resistant     OXACILLIN >=4 RESISTANT Resistant     RIFAMPIN <=0.5 SENSITIVE Sensitive     TRIMETH/SULFA >=320 RESISTANT Resistant     VANCOMYCIN <=0.5 SENSITIVE Sensitive     TETRACYCLINE <=1 SENSITIVE Sensitive     * ABUNDANT METHICILLIN RESISTANT STAPHYLOCOCCUS AUREUS  Blood Culture (routine x 2)     Status: None   Collection Time: 08/07/15 11:18 AM  Result Value Ref Range Status   Specimen Description BLOOD LEFT HAND  Final   Special Requests BOTTLES DRAWN AEROBIC ONLY 10CC  Final   Culture  Setup Time   Final    GRAM POSITIVE COCCI IN CLUSTERS AEROBIC BOTTLE ONLY CRITICAL RESULT CALLED TO, READ BACK BY AND VERIFIED WITH: Lin Landsman RN [phone]  0541 GREEN R CONFIRMED BY M. CAMPBELL    Culture   Final    STAPHYLOCOCCUS AUREUS SUSCEPTIBILITIES PERFORMED ON PREVIOUS CULTURE WITHIN THE LAST 5 DAYS.    Report Status 08/10/2015 FINAL  Final  Urine culture     Status: None   Collection Time: 08/07/15 12:23 PM  Result Value Ref Range Status   Specimen Description URINE, CATHETERIZED  Final   Special Requests NONE  Final   Culture   Final    >=100,000 COLONIES/mL METHICILLIN RESISTANT STAPHYLOCOCCUS AUREUS   Report Status 08/09/2015 FINAL  Final   Organism ID,  Bacteria METHICILLIN RESISTANT STAPHYLOCOCCUS AUREUS  Final      Susceptibility   Methicillin resistant staphylococcus aureus - MIC*    CIPROFLOXACIN >=8 RESISTANT Resistant     GENTAMICIN <=0.5 SENSITIVE Sensitive     NITROFURANTOIN <=16 SENSITIVE Sensitive     OXACILLIN >=4 RESISTANT Resistant     TETRACYCLINE <=1 SENSITIVE Sensitive     VANCOMYCIN 1 SENSITIVE Sensitive     TRIMETH/SULFA >=320 RESISTANT Resistant     CLINDAMYCIN <=0.25 SENSITIVE Sensitive     RIFAMPIN <=0.5 SENSITIVE Sensitive     Inducible Clindamycin NEGATIVE Sensitive     * >=100,000 COLONIES/mL METHICILLIN RESISTANT STAPHYLOCOCCUS AUREUS  MRSA PCR Screening     Status: Abnormal   Collection Time: 08/08/15  5:32 AM  Result Value Ref Range Status   MRSA by PCR POSITIVE (A) NEGATIVE Final    Comment:        The GeneXpert MRSA Assay (FDA approved for NASAL specimens only), is one component of a comprehensive MRSA colonization surveillance program. It is not intended to diagnose MRSA infection nor to guide or monitor treatment for MRSA infections. RESULT CALLED TO, READ BACK BY AND VERIFIED WITH: RN GORDON,R AT 1002 [id number] MARTINB   Anaerobic culture     Status: None   Collection Time: 08/08/15  8:28 AM  Result Value Ref Range Status   Specimen Description WOUND RIGHT LEG  Final   Special Requests POF VANCOMYCIN LOWER RIGHT LEG INCISION PART A   Final   Gram Stain   Final    ABUNDANT WBC PRESENT, PREDOMINANTLY PMN NO SQUAMOUS EPITHELIAL CELLS SEEN FEW GRAM POSITIVE COCCI IN CLUSTERS IN PAIRS Performed at [facility]    Culture   Final    NO ANAEROBES ISOLATED Performed at [facility]    Report Status 08/13/2015 FINAL  Final  Wound culture     Status: None   Collection Time: 08/08/15  8:28 AM  Result Value Ref Range Status   Specimen Description WOUND RIGHT LEG  Final   Special Requests POF VANCOMYCIN RIGHT LOWER LEG INCISION PART A  Final   Gram Stain   Final    FEW WBC  PRESENT, PREDOMINANTLY PMN NO SQUAMOUS EPITHELIAL CELLS SEEN RARE GRAM POSITIVE COCCI IN PAIRS Performed at [facility]    Culture   Final    MODERATE METHICILLIN RESISTANT STAPHYLOCOCCUS AUREUS Note: RIFAMPIN AND GENTAMICIN SHOULD NOT BE USED AS SINGLE DRUGS FOR TREATMENT OF STAPH INFECTIONS. This organism DOES NOT demonstrate inducible Clindamycin resistance in vitro. CRITICAL RESULT CALLED TO, READ BACK BY AND VERIFIED WITH: CENLINA S 2/22 @  0920 BY REAMM Performed at [facility]    Report Status 08/11/2015 FINAL  Final   Organism ID, Bacteria METHICILLIN RESISTANT STAPHYLOCOCCUS AUREUS  Final      Susceptibility   Methicillin resistant staphylococcus aureus - MIC*    CLINDAMYCIN <=0.25 SENSITIVE  Sensitive     ERYTHROMYCIN >=8 RESISTANT Resistant     GENTAMICIN <=0.5 SENSITIVE Sensitive     LEVOFLOXACIN >=8 RESISTANT Resistant     OXACILLIN >=4 RESISTANT Resistant     RIFAMPIN <=0.5 SENSITIVE Sensitive     TRIMETH/SULFA >=320 RESISTANT Resistant     VANCOMYCIN 1 SENSITIVE Sensitive     TETRACYCLINE <=1 SENSITIVE Sensitive     * MODERATE METHICILLIN RESISTANT STAPHYLOCOCCUS AUREUS  Anaerobic culture     Status: None   Collection Time: 08/08/15  8:49 AM  Result Value Ref Range Status   Specimen Description WOUND RIGHT LEG  Final   Special Requests POF VANCOMYCIN PROXIMAL RIGHT LOWER LEG PART B  Final   Gram Stain   Final    ABUNDANT WBC PRESENT, PREDOMINANTLY PMN NO SQUAMOUS EPITHELIAL CELLS SEEN FEW GRAM POSITIVE COCCI IN PAIRS IN CLUSTERS Performed at [facility]    Culture   Final    NO ANAEROBES ISOLATED Performed at [facility]    Report Status 08/13/2015 FINAL  Final  Wound culture     Status: None   Collection Time: 08/08/15  8:49 AM  Result Value Ref Range Status   Specimen Description WOUND RIGHT LEG  Final   Special Requests POF VANCOMYCIN PROXIMAL RIGHT LEG INCISON PART B  Final   Gram Stain   Final    FEW WBC PRESENT,  PREDOMINANTLY PMN NO SQUAMOUS EPITHELIAL CELLS SEEN FEW GRAM POSITIVE COCCI IN PAIRS IN CLUSTERS Performed at [facility]    Culture   Final    MODERATE METHICILLIN RESISTANT STAPHYLOCOCCUS AUREUS Note: RIFAMPIN AND GENTAMICIN SHOULD NOT BE USED AS SINGLE DRUGS FOR TREATMENT OF STAPH INFECTIONS. This organism DOES NOT demonstrate inducible Clindamycin resistance in vitro. CRITICAL RESULT CALLED TO, READ BACK BY AND VERIFIED WITH: CENLINA S 2/22 @  0920 BY REAMM Performed at [facility]    Report Status 08/11/2015 FINAL  Final   Organism ID, Bacteria METHICILLIN RESISTANT STAPHYLOCOCCUS AUREUS  Final      Susceptibility   Methicillin resistant staphylococcus aureus - MIC*    CLINDAMYCIN <=0.25 SENSITIVE Sensitive     ERYTHROMYCIN >=8 RESISTANT Resistant     GENTAMICIN <=0.5 SENSITIVE Sensitive     LEVOFLOXACIN >=8 RESISTANT Resistant     OXACILLIN >=4 RESISTANT Resistant     RIFAMPIN <=0.5 SENSITIVE Sensitive     TRIMETH/SULFA >=320 RESISTANT Resistant     VANCOMYCIN 1 SENSITIVE Sensitive     TETRACYCLINE <=1 SENSITIVE Sensitive     * MODERATE METHICILLIN RESISTANT STAPHYLOCOCCUS AUREUS  Culture, blood (Routine X 2) w Reflex to ID Panel     Status: None   Collection Time: 08/08/15 12:40 PM  Result Value Ref Range Status   Specimen Description BLOOD RIGHT ANTECUBITAL  Final   Special Requests IN PEDIATRIC BOTTLE 2CC  Final   Culture  Setup Time   Final    GRAM POSITIVE COCCI IN CLUSTERS PEDIATRIC BOTTLE CRITICAL RESULT CALLED TO, READ BACK BY AND VERIFIED WITH: W DAVIS,RN AT 1043 08/09/15 BY L BENFIELD    Culture METHICILLIN RESISTANT STAPHYLOCOCCUS AUREUS  Final   Report Status 08/11/2015 FINAL  Final   Organism ID, Bacteria METHICILLIN RESISTANT STAPHYLOCOCCUS AUREUS  Final      Susceptibility   Methicillin resistant staphylococcus aureus - MIC*    CIPROFLOXACIN >=8 RESISTANT Resistant     ERYTHROMYCIN >=8 RESISTANT Resistant     GENTAMICIN <=0.5 SENSITIVE  Sensitive     OXACILLIN >=4 RESISTANT  Resistant     TETRACYCLINE <=1 SENSITIVE Sensitive     VANCOMYCIN 1 SENSITIVE Sensitive     TRIMETH/SULFA >=320 RESISTANT Resistant     CLINDAMYCIN <=0.25 SENSITIVE Sensitive     RIFAMPIN <=0.5 SENSITIVE Sensitive     Inducible Clindamycin NEGATIVE Sensitive     * METHICILLIN RESISTANT STAPHYLOCOCCUS AUREUS  Culture, blood (Routine X 2) w Reflex to ID Panel     Status: None   Collection Time: 08/08/15 12:45 PM  Result Value Ref Range Status   Specimen Description BLOOD RIGHT ARM  Final   Special Requests BOTTLES DRAWN AEROBIC ONLY  5CC  Final   Culture NO GROWTH 5 DAYS  Final   Report Status 08/13/2015 FINAL  Final  Culture, blood (Routine X 2) w Reflex to ID Panel     Status: None   Collection Time: 08/09/15 12:09 PM  Result Value Ref Range Status   Specimen Description BLOOD BLOOD RIGHT FOREARM  Final   Special Requests IN PEDIATRIC BOTTLE .3CC  Final   Culture NO GROWTH 5 DAYS  Final   Report Status 08/14/2015 FINAL  Final  Culture, blood (Routine X 2) w Reflex to ID Panel     Status: None   Collection Time: 08/09/15 12:15 PM  Result Value Ref Range Status   Specimen Description BLOOD RIGHT ANTECUBITAL  Final   Special Requests BOTTLES DRAWN AEROBIC ONLY 6CC  Final   Culture NO GROWTH 5 DAYS  Final   Report Status 08/14/2015 FINAL  Final  Culture, routine-abscess     Status: None   Collection Time: 08/13/15 12:16 PM  Result Value Ref Range Status   Specimen Description OTHER  Final   Special Requests LEFT SCAPULA  Final   Gram Stain   Final    RARE WBC NO SQUAMOUS EPITHELIAL CELLS SEEN NO ORGANISMS SEEN Performed at [facility]    Culture   Final    NO GROWTH 2 DAYS Performed at [facility]    Report Status 08/15/2015 FINAL  Final    Studies/Results: Dg Chest 2 View  08/15/2015  CLINICAL DATA:  Check for possible effusion EXAM: CHEST  2 VIEW COMPARISON:  08/14/2015 FINDINGS: Cardiac shadow is within normal  limits. The lungs are well-aerated bilaterally. Small pleural effusions are noted on the lateral projection likely too small for significant thoracentesis. No focal infiltrate is noted. IMPRESSION: Small pleural effusions bilaterally likely too small for adequate thoracentesis. No focal infiltrate is noted. Electronically Signed   By: Alcide Clever M.D.   On: 08/15/2015 20:01   Ir Veno/ext/uni Right  08/16/2015  INDICATION: DIABETIC FOOT ULCER, ACCESS FOR LONG-TERM ANTIBIOTICS EXAM: ULTRASOUND GUIDANCE FOR VASCULAR ACCESS RIGHT UPPER EXTREMITY VENOGRAM LEFT UPPER EXTREMITY PICC LINE PLACEMENT WITH ULTRASOUND AND FLUOROSCOPIC GUIDANCE MEDICATIONS: 1% LIDOCAINE LOCALLY ANESTHESIA/SEDATION: NONE. The patient's level of consciousness and vital signs were monitored continuously by radiology nursing throughout the procedure under my direct supervision. FLUOROSCOPY TIME:  Fluoroscopy Time: 3 minutes 30 seconds (17 mGy). COMPLICATIONS: None immediate. PROCEDURE: The patient was advised of the possible risks and complications and agreed to undergo the procedure. The patient was then brought to the angiographic suite for the procedure. Ultrasound guidance for vascular access and right upper extremity venogram: Initially under sterile conditions and local anesthesia, right basilic micropuncture venous access performed with ultrasound. Images were obtained for documentation. Basilic vein was demonstrated to be patent. Guidewire would not advance centrally for accurate measurements for a PICC line length. Five French sheath inserted.  Contrast injection performed for central right upper extremity venogram. Right upper extremity venogram: Right brachial and axillary veins are patent. 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 Jugular collaterals are present opacifying the patent right innominate vein and SVC. Several attempts were made to cross the right subclavian venous  occlusion with a Kumpe catheter and guidewire however this was unsuccessful. Therefore this access was aborted for PICC line placement. Left arm will be utilized. Left upper extremity PICC line insertion with ultrasound guidance: The left arm was prepped with chlorhexidine, draped in the usual sterile fashion using maximum barrier technique (cap and mask, sterile gown, sterile gloves, large sterile sheet, hand hygiene and cutaneous antisepsis) and infiltrated locally with 1% Lidocaine. Ultrasound demonstrated patency of the left basilic vein, and this was documented with an image. Under real-time ultrasound guidance, this vein was accessed with a 21 gauge micropuncture needle and image documentation was performed. A 0.018 wire was introduced in to the vein. Over this, a 5 [place] single lumen power PICC was advanced to the lower SVC/right atrial junction. Fluoroscopy during the procedure and fluoro spot radiograph confirms appropriate catheter position. The catheter was flushed and covered with asterile dressing. Catheter length: None immediate IMPRESSION: Initial attempts at placement of a right PICC line were unsuccessful related to difficulty pass the guidewire. 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 Successful left arm power PICC line placement with ultrasound and fluoroscopic guidance. The catheter is ready for use. Electronically Signed   By: Judie Petit.  Shick M.D.   On: 08/16/2015 17:24   Ir Fluoro Guide Cv Line Left  08/16/2015  INDICATION: DIABETIC FOOT ULCER, ACCESS FOR LONG-TERM ANTIBIOTICS EXAM: ULTRASOUND GUIDANCE FOR VASCULAR ACCESS RIGHT UPPER EXTREMITY VENOGRAM LEFT UPPER EXTREMITY PICC LINE PLACEMENT WITH ULTRASOUND AND FLUOROSCOPIC GUIDANCE MEDICATIONS: 1% LIDOCAINE LOCALLY ANESTHESIA/SEDATION: NONE. The patient's level of consciousness and vital signs were monitored continuously by radiology nursing throughout the procedure under my direct  supervision. FLUOROSCOPY TIME:  Fluoroscopy Time: 3 minutes 30 seconds (17 mGy). COMPLICATIONS: None immediate. PROCEDURE: The patient was advised of the possible risks and complications and agreed to undergo the procedure. The patient was then brought to the angiographic suite for the procedure. Ultrasound guidance for vascular access and right upper extremity venogram: Initially under sterile conditions and local anesthesia, right basilic micropuncture venous access performed with ultrasound. Images were obtained for documentation. Basilic vein was demonstrated to be patent. Guidewire would not advance centrally for accurate measurements for a PICC line length. Five French sheath inserted. Contrast injection performed for central right upper extremity venogram. Right upper extremity venogram: Right brachial and axillary veins are patent. 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 Jugular collaterals are present opacifying the patent right innominate vein and SVC. Several attempts were made to cross the right subclavian venous occlusion with a Kumpe catheter and guidewire however this was unsuccessful. Therefore this access was aborted for PICC line placement. Left arm will be utilized. Left upper extremity PICC line insertion with ultrasound guidance: The left arm was prepped with chlorhexidine, draped in the usual sterile fashion using maximum barrier technique (cap and mask, sterile gown, sterile gloves, large sterile sheet, hand hygiene and cutaneous antisepsis) and infiltrated locally with 1% Lidocaine. Ultrasound demonstrated patency of the left basilic vein, and this was documented with an image. Under real-time ultrasound guidance, this vein was accessed with a 21 gauge micropuncture  needle and image documentation was performed. A 0.018 wire was introduced in to the vein. Over this, a 5 [place] single lumen power PICC was advanced to the lower SVC/right  atrial junction. Fluoroscopy during the procedure and fluoro spot radiograph confirms appropriate catheter position. The catheter was flushed and covered with asterile dressing. Catheter length: None immediate IMPRESSION: Initial attempts at placement of a right PICC line were unsuccessful related to difficulty pass the guidewire. 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 Successful left arm power PICC line placement with ultrasound and fluoroscopic guidance. The catheter is ready for use. Electronically Signed   By: Judie Petit.  Shick M.D.   On: 08/16/2015 17:24   Ir US Guide Vasc Access Left  08/16/2015  INDICATION: DIABETIC FOOT ULCER, ACCESS FOR LONG-TERM ANTIBIOTICS EXAM: ULTRASOUND GUIDANCE FOR VASCULAR ACCESS RIGHT UPPER EXTREMITY VENOGRAM LEFT UPPER EXTREMITY PICC LINE PLACEMENT WITH ULTRASOUND AND FLUOROSCOPIC GUIDANCE MEDICATIONS: 1% LIDOCAINE LOCALLY ANESTHESIA/SEDATION: NONE. The patient's level of consciousness and vital signs were monitored continuously by radiology nursing throughout the procedure under my direct supervision. FLUOROSCOPY TIME:  Fluoroscopy Time: 3 minutes 30 seconds (17 mGy). COMPLICATIONS: None immediate. PROCEDURE: The patient was advised of the possible risks and complications and agreed to undergo the procedure. The patient was then brought to the angiographic suite for the procedure. Ultrasound guidance for vascular access and right upper extremity venogram: Initially under sterile conditions and local anesthesia, right basilic micropuncture venous access performed with ultrasound. Images were obtained for documentation. Basilic vein was demonstrated to be patent. Guidewire would not advance centrally for accurate measurements for a PICC line length. Five French sheath inserted. Contrast injection performed for central right upper extremity venogram. Right upper extremity venogram: Right brachial and axillary veins are patent.  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 Jugular collaterals are present opacifying the patent right innominate vein and SVC. Several attempts were made to cross the right subclavian venous occlusion with a Kumpe catheter and guidewire however this was unsuccessful. Therefore this access was aborted for PICC line placement. Left arm will be utilized. Left upper extremity PICC line insertion with ultrasound guidance: The left arm was prepped with chlorhexidine, draped in the usual sterile fashion using maximum barrier technique (cap and mask, sterile gown, sterile gloves, large sterile sheet, hand hygiene and cutaneous antisepsis) and infiltrated locally with 1% Lidocaine. Ultrasound demonstrated patency of the left basilic vein, and this was documented with an image. Under real-time ultrasound guidance, this vein was accessed with a 21 gauge micropuncture needle and image documentation was performed. A 0.018 wire was introduced in to the vein. Over this, a 5 [place] single lumen power PICC was advanced to the lower SVC/right atrial junction. Fluoroscopy during the procedure and fluoro spot radiograph confirms appropriate catheter position. The catheter was flushed and covered with asterile dressing. Catheter length: None immediate IMPRESSION: Initial attempts at placement of a right PICC line were unsuccessful related to difficulty pass the guidewire. 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 Successful left arm power PICC line placement with ultrasound and fluoroscopic guidance. The catheter is ready for use. Electronically Signed   By: Judie Petit.  Shick M.D.   On: 08/16/2015 17:24   Ir US Guide Vasc Access Right  08/16/2015  INDICATION: DIABETIC FOOT ULCER, ACCESS FOR LONG-TERM ANTIBIOTICS EXAM: ULTRASOUND GUIDANCE FOR VASCULAR ACCESS RIGHT UPPER EXTREMITY VENOGRAM LEFT UPPER EXTREMITY PICC LINE PLACEMENT WITH  ULTRASOUND AND FLUOROSCOPIC GUIDANCE MEDICATIONS: 1% LIDOCAINE LOCALLY ANESTHESIA/SEDATION: NONE. The patient's level of consciousness and vital signs were monitored continuously by radiology nursing throughout the procedure under my direct supervision. FLUOROSCOPY TIME:  Fluoroscopy Time: 3 minutes 30 seconds (17 mGy). COMPLICATIONS: None immediate. PROCEDURE: The patient was advised of the possible risks and complications and agreed to undergo the procedure. The patient was then brought to the angiographic suite for the procedure. Ultrasound guidance for vascular access and right upper extremity venogram: Initially under sterile conditions and local anesthesia, right basilic micropuncture venous access performed with ultrasound. Images were obtained for documentation. Basilic vein was demonstrated to be patent. Guidewire would not advance centrally for accurate measurements for a PICC line length. Five French sheath inserted. Contrast injection performed for central right upper extremity venogram. Right upper extremity venogram: Right brachial and axillary veins are patent. 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 Jugular collaterals are present opacifying the patent right innominate vein and SVC. Several attempts were made to cross the right subclavian venous occlusion with a Kumpe catheter and guidewire however this was unsuccessful. Therefore this access was aborted for PICC line placement. Left arm will be utilized. Left upper extremity PICC line insertion with ultrasound guidance: The left arm was prepped with chlorhexidine, draped in the usual sterile fashion using maximum barrier technique (cap and mask, sterile gown, sterile gloves, large sterile sheet, hand hygiene and cutaneous antisepsis) and infiltrated locally with 1% Lidocaine. Ultrasound demonstrated patency of the left basilic vein, and this was documented with an image. Under real-time  ultrasound guidance, this vein was accessed with a 21 gauge micropuncture needle and image documentation was performed. A 0.018 wire was introduced in to the vein. Over this, a 5 [place] single lumen power PICC was advanced to the lower SVC/right atrial junction. Fluoroscopy during the procedure and fluoro spot radiograph confirms appropriate catheter position. The catheter was flushed and covered with asterile dressing. Catheter length: None immediate IMPRESSION: Initial attempts at placement of a right PICC line were unsuccessful related to difficulty pass the guidewire. 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 Successful left arm power PICC line placement with ultrasound and fluoroscopic guidance. The catheter is ready for use. Electronically Signed   By: Judie Petit.  Shick M.D.   On: 08/16/2015 17:24      Assessment/Plan:  INTERVAL HISTORY:   07/2015: original cultures ID with MRSA, repeat blood cultures unfortunately PERSISTENTLY POsitive in 1/2  2/21--08/11/15: 2/19 blood cultures still + for MRSA, repeat blood cultures from 08/09/15: no growth  08/11/15--08/13/15: MRI T and L spine performed which showed R> L pleural effusion, Rim enhancing fluid collections in the posterior left chest wall fluid - associated with multiple displaced rib fractures - and in the right gluteal muscle fluid collection are suspicious for phlegmon/abscess   2/25--08/16/15: pt blood cs NGTD final, PICC in place  Pt is sp IR guided aspirate of the chest wall fluid collection  Principal Problem:   Sepsis (HCC) Active Problems:   UTI (lower urinary tract infection)   DM type 2, goal HbA1c < 7% (HCC)   Chronic hyponatremia   Infection   Hip fracture requiring operative repair (HCC)   MRSA bacteremia   Staphylococcus aureus bacteremia with sepsis (HCC)   Osteomyelitis of right hip (HCC)   Hardware complicating wound infection (HCC)   Sepsis due to methicillin  resistant Staphylococcus aureus (MRSA) (HCC)   Uncontrolled type 2  diabetes mellitus with complication (HCC)   Acute urinary retention   Low back pain   Muscle spasms of both lower extremities   Controlled diabetes mellitus type 2 with complications (HCC)   Hypokalemia   Acute pyelonephritis   Abscess   Chest wall abscess   Pleural effusion   Gluteal abscess   Diabetic foot ulcer (HCC)    Jeff Ferrell is a 65 y.o. male with  MRSA bacteremia sepsis due to Right hip hardware associated hip infection   #1 MRSA bacteremia     [facility] Staphylococcus aureus bacteremia   Staphylococcus aureus bacteremia (SAB) is associated with a high rate of complications and mortality.  Specific aspects of clinical management are critical to optimizing the outcome of patients with SAB.  Therefore, the [facility] Antimicrobial Management Team [facility]) has initiated an intervention aimed at improving the management of SAB at [facility].  To do so, Infectious Diseases physicians are providing an evidence-based consult for the management of all patients with SAB.     Yes No Comments  Perform follow-up blood cultures (even if the patient is afebrile) to ensure clearance of bacteremia [x]  []  NO GROWTH on repeat cultures  Remove vascular catheter and obtain follow-up blood cultures after the removal of the catheter []  []  PICC line in place now  Perform echocardiography to evaluate for endocarditis (transthoracic ECHO is 40-50% sensitive, TEE is > 90% sensitive) []  []  Please keep in mind, that neither test can definitively EXCLUDE endocarditis, and that should clinical suspicion remain high for endocarditis the patient should then still be treated with an "endocarditis" duration of therapy = 6 weeks  TEE without vegetations  Consult electrophysiologist to evaluate implanted cardiac device (pacemaker, ICD) []  []    Ensure source control []  []  Have all abscesses been drained  effectively? Have deep seeded infections (septic joints or osteomyelitis) had appropriate surgical debridement?  He has had I and D of hip. He still has hardware that may need to be removed but trying to keep it in for now due to his fracture  Dr. Roda Shutters considering repeat trip to OR tomorrow.   Investigate for "metastatic" sites of infection []  []  Does the patient have ANY symptom or physical exam finding that would suggest a deeper infection (back or neck pain that may be suggestive of vertebral osteomyelitis or epidural abscess, muscle pain that could be a symptom of pyomyositis)?  Keep in mind that for deep seeded infections MRI imaging with contrast is preferred rather than other often insensitive tests such as plain x-rays, especially early in a patient's presentation.  I am concerned about his worsening mid back and lower back pain. MRI L spine done in January did not show diskitis  He has had aspirate of his chest wall fluid collection which seems to be another nidus, abscess  RIGHT GLUTEAL MUSCLE FLUID COLLECTION--  I discussed with Dr. Roda Shutters and he is planning a repeat MRI of this region today--will again need sedation I expect  Change antibiotic therapy to Vancomycin []  []  Beta-lactam antibiotics are preferred for MSSA due to higher cure rates.   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 Roda Shutters, MRI today. He may also I and D this  area along with hip on return trip to OR  Dr. Orvan Falconer is  picking up the service tomorrow.    LOS: 10 days   Acey Lav 08/17/2015, 2:12 PM

## 2015-08-18 ENCOUNTER — Encounter (HOSPITAL_COMMUNITY)
Admission: EM | Disposition: A | Discharge status: Home Health | Payer: Self-pay | Source: Home / Self Care | Attending: Internal Medicine

## 2015-08-18 ENCOUNTER — Inpatient Hospital Stay (HOSPITAL_COMMUNITY): Payer: Medicare Other | Admitting: Anesthesiology

## 2015-08-18 DIAGNOSIS — S71001A Unspecified open wound, right hip, initial encounter: Secondary | ICD-10-CM

## 2015-08-18 HISTORY — PX: INCISION AND DRAINAGE HIP: SHX1801

## 2015-08-18 LAB — CBC
HEMATOCRIT: 24.9 % — AB (ref 39.0–52.0)
Hemoglobin: 7.7 g/dL — ABNORMAL LOW (ref 13.0–17.0)
MCH: 24.2 pg — AB (ref 26.0–34.0)
MCHC: 30.9 g/dL (ref 30.0–36.0)
MCV: 78.3 fL (ref 78.0–100.0)
Platelets: 565 10*3/uL — ABNORMAL HIGH (ref 150–400)
RBC: 3.18 MIL/uL — ABNORMAL LOW (ref 4.22–5.81)
RDW: 14.9 % (ref 11.5–15.5)
WBC: 9 10*3/uL (ref 4.0–10.5)

## 2015-08-18 LAB — BASIC METABOLIC PANEL
ANION GAP: 10 (ref 5–15)
BUN: <5 mg/dL — ABNORMAL LOW (ref 6–20)
CALCIUM: 8.6 mg/dL — AB (ref 8.9–10.3)
CO2: 25 mmol/L (ref 22–32)
Chloride: 101 mmol/L (ref 101–111)
Creatinine, Ser: 0.48 mg/dL — ABNORMAL LOW (ref 0.61–1.24)
GFR calc Af Amer: >60 mL/min (ref >60)
GFR calc non Af Amer: >60 mL/min (ref >60)
GLUCOSE: 123 mg/dL — AB (ref 65–99)
POTASSIUM: 4.3 mmol/L (ref 3.5–5.1)
Sodium: 136 mmol/L (ref 135–145)

## 2015-08-18 LAB — PREPARE RBC (CROSSMATCH)

## 2015-08-18 LAB — GLUCOSE, CAPILLARY
GLUCOSE-CAPILLARY: 141 mg/dL — AB (ref 65–99)
GLUCOSE-CAPILLARY: 117 mg/dL — AB (ref 65–99)
Glucose-Capillary: 139 mg/dL — ABNORMAL HIGH (ref 65–99)
Glucose-Capillary: 112 mg/dL — ABNORMAL HIGH (ref 65–99)
Glucose-Capillary: 93 mg/dL (ref 65–99)
Glucose-Capillary: 75 mg/dL (ref 65–99)

## 2015-08-18 SURGERY — IRRIGATION AND DEBRIDEMENT HIP
Anesthesia: General | Site: Hip | Laterality: Right

## 2015-08-18 MED ORDER — ONDANSETRON HCL 4 MG/2ML IJ SOLN: 4.0000 mg | Freq: Once | INTRAMUSCULAR | Status: DC | PRN

## 2015-08-18 MED ORDER — ONDANSETRON HCL 4 MG/2ML IJ SOLN
INTRAMUSCULAR | Status: AC
  Filled 2015-08-18: qty 2

## 2015-08-18 MED ORDER — LIDOCAINE HCL (CARDIAC) 20 MG/ML IV SOLN
INTRAVENOUS | Status: AC
  Filled 2015-08-18: qty 5

## 2015-08-18 MED ORDER — SUGAMMADEX SODIUM 200 MG/2ML IV SOLN
INTRAVENOUS | Status: AC
  Filled 2015-08-18: qty 2

## 2015-08-18 MED ORDER — MIDAZOLAM HCL 5 MG/5ML IJ SOLN
INTRAMUSCULAR | Status: DC | PRN
  Administered 2015-08-18: 2 mg via INTRAVENOUS

## 2015-08-18 MED ORDER — PROPOFOL 10 MG/ML IV BOLUS
INTRAVENOUS | Status: AC
  Filled 2015-08-18: qty 20

## 2015-08-18 MED ORDER — BACLOFEN 10 MG PO TABS: 5.0000 mg | ORAL_TABLET | Freq: Three times a day (TID) | ORAL | Status: DC

## 2015-08-18 MED ORDER — HYDROMORPHONE HCL 1 MG/ML IJ SOLN
0.2500 mg | INTRAMUSCULAR | Status: DC | PRN
  Administered 2015-08-18 (×2): 0.5 mg via INTRAVENOUS

## 2015-08-18 MED ORDER — MIDAZOLAM HCL 2 MG/2ML IJ SOLN
INTRAMUSCULAR | Status: AC
  Filled 2015-08-18: qty 2

## 2015-08-18 MED ORDER — MEPERIDINE HCL 25 MG/ML IJ SOLN: 6.2500 mg | INTRAMUSCULAR | Status: DC | PRN

## 2015-08-18 MED ORDER — LACTATED RINGERS IV SOLN
INTRAVENOUS | Status: DC | PRN
  Administered 2015-08-18: 15:00:00 via INTRAVENOUS

## 2015-08-18 MED ORDER — SODIUM CHLORIDE 0.9 % IR SOLN
Status: DC | PRN
  Administered 2015-08-18 (×2): 3000 mL

## 2015-08-18 MED ORDER — PHENYLEPHRINE HCL 10 MG/ML IJ SOLN
INTRAMUSCULAR | Status: DC | PRN
  Administered 2015-08-18 (×2): 120 ug via INTRAVENOUS
  Administered 2015-08-18: 80 ug via INTRAVENOUS

## 2015-08-18 MED ORDER — SODIUM CHLORIDE 0.9 % IV SOLN
Freq: Once | INTRAVENOUS | Status: AC
  Administered 2015-08-28: 22:00:00 via INTRAVENOUS

## 2015-08-18 MED ORDER — METAXALONE 800 MG PO TABS
800.0000 mg | ORAL_TABLET | Freq: Four times a day (QID) | ORAL | Status: DC
  Filled 2015-08-18: qty 1

## 2015-08-18 MED ORDER — HYDROMORPHONE HCL 1 MG/ML IJ SOLN
INTRAMUSCULAR | Status: AC
  Administered 2015-08-18: 0.5 mg via INTRAVENOUS
  Filled 2015-08-18: qty 1

## 2015-08-18 MED ORDER — 0.9 % SODIUM CHLORIDE (POUR BTL) OPTIME
TOPICAL | Status: DC | PRN
  Administered 2015-08-18: 1000 mL

## 2015-08-18 MED ORDER — FENTANYL CITRATE (PF) 250 MCG/5ML IJ SOLN
INTRAMUSCULAR | Status: AC
  Filled 2015-08-18: qty 5

## 2015-08-18 MED ORDER — ONDANSETRON HCL 4 MG/2ML IJ SOLN
INTRAMUSCULAR | Status: DC | PRN
  Administered 2015-08-18: 4 mg via INTRAVENOUS

## 2015-08-18 MED ORDER — BACLOFEN 10 MG PO TABS
5.0000 mg | ORAL_TABLET | Freq: Three times a day (TID) | ORAL | Status: DC
  Administered 2015-08-18 – 2015-08-20 (×6): 5 mg via ORAL
  Filled 2015-08-18 (×6): qty 1

## 2015-08-18 MED ORDER — SUGAMMADEX SODIUM 200 MG/2ML IV SOLN
INTRAVENOUS | Status: DC | PRN
  Administered 2015-08-18: 200 mg via INTRAVENOUS

## 2015-08-18 SURGICAL SUPPLY — 42 items
BNDG COHESIVE 6X5 TAN STRL LF (GAUZE/BANDAGES/DRESSINGS) ×2 IMPLANT
COVER SURGICAL LIGHT HANDLE (MISCELLANEOUS) ×3 IMPLANT
DRAPE IMP U-DRAPE 54X76 (DRAPES) ×3 IMPLANT
DRAPE INCISE IOBAN 66X45 STRL (DRAPES) ×2 IMPLANT
DRAPE INCISE IOBAN 85X60 (DRAPES) IMPLANT
DRSG AQUACEL AG ADV 3.5X10 (GAUZE/BANDAGES/DRESSINGS) IMPLANT
DRSG MEPILEX BORDER 4X8 (GAUZE/BANDAGES/DRESSINGS) IMPLANT
DRSG VAC ATS LRG SENSATRAC (GAUZE/BANDAGES/DRESSINGS) ×2 IMPLANT
DURAPREP 26ML APPLICATOR (WOUND CARE) ×3 IMPLANT
ELECT CAUTERY BLADE 6.4 (BLADE) ×3 IMPLANT
ELECT REM PT RETURN 9FT ADLT (ELECTROSURGICAL) ×3
ELECTRODE REM PT RTRN 9FT ADLT (ELECTROSURGICAL) ×1 IMPLANT
EVACUATOR 1/8 PVC DRAIN (DRAIN) IMPLANT
FACESHIELD WRAPAROUND (MASK) IMPLANT
FACESHIELD WRAPAROUND OR TEAM (MASK) IMPLANT
GLOVE SKINSENSE NS SZ7.5 (GLOVE) ×6
GLOVE SKINSENSE STRL SZ7.5 (GLOVE) ×2 IMPLANT
GLOVE SURG SYN 7.5  E (GLOVE) ×4
GLOVE SURG SYN 7.5 E (GLOVE) ×2 IMPLANT
GLOVE SURG SYN 7.5 PF PI (GLOVE) IMPLANT
GOWN STRL REIN XL XLG (GOWN DISPOSABLE) ×3 IMPLANT
HANDPIECE INTERPULSE COAX TIP (DISPOSABLE) ×3
KIT BASIN OR (CUSTOM PROCEDURE TRAY) ×3 IMPLANT
KIT ROOM TURNOVER OR (KITS) ×3 IMPLANT
MANIFOLD NEPTUNE II (INSTRUMENTS) ×3 IMPLANT
NS IRRIG 1000ML POUR BTL (IV SOLUTION) ×3 IMPLANT
PACK SHOULDER (CUSTOM PROCEDURE TRAY) ×3 IMPLANT
PACK UNIVERSAL I (CUSTOM PROCEDURE TRAY) ×3 IMPLANT
PAD ARMBOARD 7.5X6 YLW CONV (MISCELLANEOUS) ×6 IMPLANT
SET HNDPC FAN SPRY TIP SCT (DISPOSABLE) ×1 IMPLANT
SPONGE LAP 18X18 X RAY DECT (DISPOSABLE) ×4 IMPLANT
STAPLER VISISTAT 35W (STAPLE) IMPLANT
SUT PDS AB 0 CT 36 (SUTURE) ×2 IMPLANT
SUT PDS AB 1 CT  36 (SUTURE) ×6
SUT PDS AB 1 CT 36 (SUTURE) ×2 IMPLANT
SUT VIC AB 0 CT1 27 (SUTURE)
SUT VIC AB 0 CT1 27XBRD ANBCTR (SUTURE) IMPLANT
SUT VIC AB 1 CTB1 27 (SUTURE) IMPLANT
SUT VIC AB 2-0 CT1 27 (SUTURE)
SUT VIC AB 2-0 CT1 TAPERPNT 27 (SUTURE) IMPLANT
TOWEL OR 17X24 6PK STRL BLUE (TOWEL DISPOSABLE) ×3 IMPLANT
TOWEL OR 17X26 10 PK STRL BLUE (TOWEL DISPOSABLE) ×3 IMPLANT

## 2015-08-18 NOTE — Progress Notes (Signed)
Patient ID: Jeff Ferrell, male   DOB: Feb 04, 1951, 65 y.o.   MRN: [id number]         [facility]    Date of Admission:  08/07/2015   Total days of antibiotics 11         Principal Problem:   Sepsis (HCC) Active Problems:   UTI (lower urinary tract infection)   DM type 2, goal HbA1c < 7% (HCC)   Chronic hyponatremia   Infection   Hip fracture requiring operative repair (HCC)   MRSA bacteremia   Staphylococcus aureus bacteremia with sepsis (HCC)   Osteomyelitis of right hip (HCC)   Hardware complicating wound infection (HCC)   Sepsis due to methicillin resistant Staphylococcus aureus (MRSA) (HCC)   Uncontrolled type 2 diabetes mellitus with complication (HCC)   Acute urinary retention   Low back pain   Muscle spasms of both lower extremities   Controlled diabetes mellitus type 2 with complications (HCC)   Hypokalemia   Acute pyelonephritis   Abscess   Chest wall abscess   Pleural effusion   Gluteal abscess   Diabetic foot ulcer (HCC)   . sodium chloride   Intravenous Once  . aspirin EC  325 mg Oral Daily  . baclofen  5 mg Oral TID  . diazepam  5 mg Oral QID  . docusate sodium  100 mg Oral BID  . feeding supplement (ENSURE ENLIVE)  237 mL Oral TID BM  . gabapentin  300 mg Oral TID  . insulin aspart  0-5 Units Subcutaneous QHS  . insulin aspart  0-9 Units Subcutaneous TID WC  . magnesium oxide  200 mg Oral BID  . rifampin  600 mg Oral Daily  . sodium chloride flush  10-40 mL Intracatheter Q12H  . tamsulosin  0.4 mg Oral Daily  . vancomycin  1,500 mg Intravenous Q12H    SUBJECTIVE: He is complaining of pain and muscle spasms particularly in his lower back and groin.  Review of Systems: Review of Systems  Constitutional: Positive for malaise/fatigue. Negative for fever, chills, weight loss and diaphoresis.  HENT: Negative for sore throat.   Respiratory: Negative for cough, sputum production and shortness of breath.   Cardiovascular:  Negative for chest pain.  Gastrointestinal: Positive for abdominal pain. Negative for nausea, vomiting and diarrhea.  Musculoskeletal: Positive for back pain and joint pain. Negative for myalgias.  Skin: Negative for rash.  Neurological: Positive for weakness. Negative for focal weakness and headaches.    Past Medical History  Diagnosis Date  . Diabetes mellitus     regulated by diet  . Chronic back pain   . 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 Diabetic osteomyelitis (HCC) 03/29/2015  . Diabetic foot ulcer (HCC) 03/29/2015    Social History  Substance Use Topics  . Smoking status: Never Smoker   . Smokeless tobacco: None  . Alcohol Use: 3.6 oz/week    6 Cans of beer per week     Comment: daily    History reviewed. No pertinent family history. Allergies  Allergen Reactions  . Penicillins Hives and Rash    Has patient had a PCN reaction causing immediate rash, facial/tongue/throat swelling, SOB or lightheadedness with hypotension: Yes Has patient had a PCN reaction causing severe rash involving mucus membranes or skin necrosis: Yes   Has patient had a PCN reaction that required hospitalization No Has patient had a PCN reaction occurring within the last 10 years: No If all of the above answers are "  NO", then may proceed with Cephalosporin use.     OBJECTIVE: Filed Vitals:   08/17/15 1300 08/17/15 2100 08/18/15 0500 08/18/15 1000  BP: 123/68 120/70 132/74 145/81  Pulse: 84 83 93 95  Temp:  98.1 F (36.7 C) 98.7 F (37.1 C)   TempSrc:  Oral Oral   Resp: Height:      Weight:      SpO2:  96% 96% 100%   Body mass index is 23.79 kg/(m^2).  Physical Exam  Constitutional: He is oriented to person, place, and time.  He is alert and talkative. He appears fairly comfortable resting in bed.  HENT:  Mouth/Throat: No oropharyngeal exudate.  Eyes: Conjunctivae are normal.  Neck: Neck supple.  Cardiovascular: Normal rate and regular rhythm.   No murmur  heard. Pulmonary/Chest: Breath sounds normal.  Band-Aid over her left posterior chest where aspirate was performed 5 days ago.  Abdominal: Soft. There is no tenderness.  Musculoskeletal:  There is a large amount of drainage on his right hip dressing  Neurological: He is alert and oriented to person, place, and time.  Skin: No rash noted.  Multiple hyperpigmented areas on lower extremities, some with excoriations. No sign of infection.  Psychiatric: Mood and affect normal.    Lab Results Lab Results  Component Value Date   WBC 9.0 08/18/2015   HGB 7.7* 08/18/2015   HCT 24.9* 08/18/2015   MCV 78.3 08/18/2015   PLT 565* 08/18/2015    Lab Results  Component Value Date   CREATININE 0.48* 08/18/2015   BUN <5* 08/18/2015   NA 136 08/18/2015   K 4.3 08/18/2015   CL 101 08/18/2015   CO2 25 08/18/2015    Lab Results  Component Value Date   ALT 10* 08/17/2015   AST 20 08/17/2015   ALKPHOS 186* 08/17/2015   BILITOT 0.2* 08/17/2015     Microbiology: Recent Results (from the past 240 hour(s))  Culture, blood (Routine X 2) w Reflex to ID Panel     Status: None   Collection Time: 08/09/15 12:09 PM  Result Value Ref Range Status   Specimen Description BLOOD BLOOD RIGHT FOREARM  Final   Special Requests IN PEDIATRIC BOTTLE .3CC  Final   Culture NO GROWTH 5 DAYS  Final   Report Status 08/14/2015 FINAL  Final  Culture, blood (Routine X 2) w Reflex to ID Panel     Status: None   Collection Time: 08/09/15 12:15 PM  Result Value Ref Range Status   Specimen Description BLOOD RIGHT ANTECUBITAL  Final   Special Requests BOTTLES DRAWN AEROBIC ONLY 6CC  Final   Culture NO GROWTH 5 DAYS  Final   Report Status 08/14/2015 FINAL  Final  Culture, routine-abscess     Status: None   Collection Time: 08/13/15 12:16 PM  Result Value Ref Range Status   Specimen Description OTHER  Final   Special Requests LEFT SCAPULA  Final   Gram Stain   Final    RARE WBC NO SQUAMOUS EPITHELIAL CELLS SEEN NO  ORGANISMS SEEN Performed at [facility]    Culture   Final    NO GROWTH 2 DAYS Performed at [facility]    Report Status 08/15/2015 FINAL  Final  C difficile quick scan w PCR reflex     Status: None   Collection Time: 08/17/15  1:47 PM  Result Value Ref Range Status   C Diff antigen NEGATIVE NEGATIVE Final   C Diff toxin  NEGATIVE NEGATIVE Final   C Diff interpretation Negative for toxigenic C. difficile  Final     ASSESSMENT: He has probable disseminated MRSA infection complicating his recent right hip surgery. In addition to wound infection it also appears that he has a left posterior chest abscess and right gluteal abscess. He was bacteremic on admission but repeat blood cultures are negative and he has no evidence of endocarditis by echocardiography. He is due for repeat right hip surgery today. I will continue vancomycin and rifampin.  PLAN: 1. Continue current antibiotics  Cliffton Asters, MD [facility] [phone] pager   [phone] cell 08/18/2015, 1:31 PM

## 2015-08-18 NOTE — Anesthesia Procedure Notes (Signed)
Procedure Name: Intubation Date/Time: 08/18/2015 3:43 PM Performed by: Jerilee Hoh Pre-anesthesia Checklist: Patient identified, Emergency Drugs available, Suction available and Patient being monitored Patient Re-evaluated:Patient Re-evaluated prior to inductionOxygen Delivery Method: Circle system utilized Preoxygenation: Pre-oxygenation with 100% oxygen Intubation Type: IV induction Ventilation: Mask ventilation without difficulty Laryngoscope Size: Mac and 4 Grade View: Grade II Tube type: Oral Tube size: 7.5 mm Number of attempts: 1 Airway Equipment and Method: Stylet Placement Confirmation: ETT inserted through vocal cords under direct vision,  positive ETCO2 and breath sounds checked- equal and bilateral Secured at: 22 cm Tube secured with: Tape Dental Injury: Teeth and Oropharynx as per pre-operative assessment

## 2015-08-18 NOTE — Progress Notes (Signed)
Report given to [facility] in short stay. Blood bank notified RN that pt had 2 units of blood ready approximately 15 minutes ago, OR called right after saying they were sending for pt and that blood would be transfused down stairs.  Miner, Latricia Heft

## 2015-08-18 NOTE — Op Note (Signed)
   Date of Surgery: 08/18/2015  INDICATIONS: Jeff Ferrell is a 65 y.o.-year-old male with a right periprosthetic hip infection and gluteal abscess;  The patient did consent to the procedure after discussion of the risks and benefits.  PREOPERATIVE DIAGNOSIS:  1. 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 Right gluteal abscess  POSTOPERATIVE DIAGNOSIS: Same.  PROCEDURE: 1. 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 Incision and drainage of right gluteal abscess 3. Partial secondary closure of previous surgical incision 4. Application of wound VAC greater than 50 cm  SURGEON: N. Glee Arvin, M.D.  ASSIST: April Chilton Si, RNFA.  ANESTHESIA:  general  IV FLUIDS AND URINE: See anesthesia.  ESTIMATED BLOOD LOSS: 100 mL.  IMPLANTS: None  DRAINS: Wound VAC  COMPLICATIONS: None.  DESCRIPTION OF PROCEDURE: The patient was brought to the operating room and placed supine on the operating table.  The patient had been signed prior to the procedure and this was documented. The patient had the anesthesia placed by the anesthesiologist.  A time-out was performed to confirm that this was the correct patient, site, side and location. The patient did receive antibiotics prior to the incision and was re-dosed during the procedure as needed at indicated intervals. The patient was placed in the left lateral decubitus position on the pegboard with bony prominences well-padded.  The patient had the operative extremity prepped and draped in the standard surgical fashion.    The previous surgical incisions were opened back up. The incisions were then connected with a new incision. The distal portion of the incision did not exhibit any gross purulence. The tissues were all viable and healthy. Around the proximal end of the femur there was still murky fluid. Sharp excisional debridement of the skin, muscle, bone was performed using a  knife, rongeur, curette. We also decompressed the new right gluteal abscess using finger dissection. There is only a small amount of murky fluid did it did not appear to track any deeper than the abscess itself. We then thoroughly irrigated the entire wound with 6 L of pulse lavage.  I then performed a partial secondary wound closure of the previous surgical incision. This was done with #1 PDS and 0 PDS. The rest of the superficial layers were treated with a wound VAC and set to -100 mmHg. Patient tolerated the procedure well and had no immediate complications.  POSTOPERATIVE PLAN: The patient will be weight bear as tolerated to right lower extremity. He will require long-term antibiotics determined by infectious disease. We will allow the rest of the wound to heal by secondary intention with the wound VAC.    Jeff Reel, MD [facility] [phone] 4:35 PM

## 2015-08-18 NOTE — Anesthesia Preprocedure Evaluation (Signed)
Anesthesia Evaluation  Patient identified by MRN, date of birth, ID band Patient awake    Reviewed: Allergy & Precautions, NPO status   Airway Mallampati: I  TM Distance: >3 FB Neck ROM: Full    Dental   Pulmonary    Pulmonary exam normal        Cardiovascular Normal cardiovascular exam     Neuro/Psych    GI/Hepatic   Endo/Other  diabetes, Type 2, Insulin Dependent  Renal/GU      Musculoskeletal   Abdominal   Peds  Hematology   Anesthesia Other Findings   Reproductive/Obstetrics                             Anesthesia Physical Anesthesia Plan  ASA: III  Anesthesia Plan: General   Post-op Pain Management:    Induction: Intravenous  Airway Management Planned: Oral ETT  Additional Equipment:   Intra-op Plan:   Post-operative Plan: Extubation in OR  Informed Consent: I have reviewed the patients History and Physical, chart, labs and discussed the procedure including the risks, benefits and alternatives for the proposed anesthesia with the patient or authorized representative who has indicated his/her understanding and acceptance.     Plan Discussed with: CRNA and Surgeon  Anesthesia Plan Comments:         Anesthesia Quick Evaluation

## 2015-08-18 NOTE — Clinical Social Work Note (Signed)
CSW met with patient to talk to him regarding his benefits and discharging back to [facility] when he is medically ready for discharge.  Patient stated he has Veteran's admission benefits as well, CSW explained to patient that since he has Medicare as his primary insurance, his VA benefits will not help him pay for SNF because VA is not service related.  Patient requested to have [place] social worker speak to him, CSW contacted [place], who spoke to patient and answer his questions.  After discussion with patient, he is in agreement to returning back to [facility] once he is medically ready for discharge.  CSW to continue to follow patient's progress.  Jones Broom. [place], MSW, [place] 08/18/2015 6:34 PM

## 2015-08-18 NOTE — Progress Notes (Signed)
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  Left chest wall Phlegmon / Abscess -2/24/ S/P IR aspiration. Continue current antibiotics. ID following.  R gluteal muscle fluid collection  -Orthopedics following, status post incision and drainage of right gluteal abscess, 08/18/2015 per orthopedics. Continue current antibiotics. Orthopedics and ID following. might need incision and drainage of the fluid collection.  Muscle spasms bilateral lower extremity -Patient was on Valium and Skelaxin. Still having muscle spasm - Patient was tried on baclofen 5 mg 3 times a day 3 doses with improvement with muscle spasms. Will discontinue Skelaxin and place him baclofen 5 mg 3 times daily. May titrate baclofen for better control of muscle spasms.   DM2 -CBG currently controlled - follow w/ SSI - A1c 5.7 on 2/21  Chronic Hyponatremia -Workup accomplished during last hospital stay concluded hypovolemia was etiology - stable at present - follow   Acute urinary retention  -was a problem during his initial hospitalization for hip surgery - now recurring and requiring foley placement - cont antispasmotics and follow - begin voiding trials once LE spasms better controlled   Normocytic anemia  -transfuse for Hgb < 7.0 - stable at present. Patient transfused 2 units  perioperatively today 08/18/2015. Follow H&H.  Code Status: Full Family Communication: Updated patient. No family at bedside. Disposition Plan: Pending resolution of medical issues.   Consultants:  Orthopedics: Dr. August Saucer 08/07/2015  Infectious diseases: Dr. Ninetta Lights 08/08/2015  Interventional radiology  Procedures: 2/19 - I&D infected R hip wound  2/20 1 units packed red blood cells transfused 2/23 MRI T-spine/L-spine;-Massively distended urinary bladder, causing 2dary Hydronephrosis. -No evidence of acute thoracic or lumbar spinal infection. -Rim enhancing fluid collections in the posterior left chest wall fluid - associated with multiple displaced rib fractures - and in the right gluteal muscle fluid collection are suspicious for phlegmon/abscess - Lt >>Rt pleural effusions with bilateral lower lobe pneumonia. -Degenerative thoracic spinal stenosis Stable degenerative lumbar spinal stenosis; moderate degenerative cervical spinal stenosis  2/24 TEE EF= 60% to 65%. WMN - negative valvular thrombus - Pericardium, extracardiac: A trivial pericardial effusion  2/24Lt scapula fluid aspiration;3 cc serosanguinous fluid 2/27 - L UE PICC in IR  3/1- 2 units packed red blood cells transfused 3/1-1. 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 Incision and drainage of right gluteal abscess 3. Partial secondary closure of previous surgical incision 4. Application of wound VAC greater than 50 cm------ per Dr.Xu   Antibiotics: Aztreonam 2/18>>>2/19 Vancomycin 2/18 > Ceftriaxone 2/19 >>>>2/20 Rifampin 2/19 >    HPI/Subjective: Patient just returned from PACU. Patient opens his eyes to verbal similar. Patient states baclofen has been the only thing that has helped with his muscle spasms. Patient denies any chest pain. No shortness of breath.  Objective: Filed Vitals:   08/18/15 1801 08/18/15 1900  BP: 144/85  139/79  Pulse: 97 101  Temp: 97.8 F (36.6 C) 98.1 F (36.7 C)  Resp: 14 14    Intake/Output Summary (Last 24 hours) at 08/18/15 2127 Last data filed at 08/18/15 1900  Gross per 24 hour  Intake   1790 ml  Output   3675 ml  Net  -1885 ml   Filed Weights   08/09/15 0255  Weight: 73.1 kg (161 lb 2.5 oz)    Exam:   General:  NAD.  Cardiovascular: Regular rate rhythm no murmurs rubs or gallops  Respiratory: Clear to auscultation bilaterally anterior lung fields.  Abdomen: Soft, nontender, nondistended, positive bowel sounds. Wound VAC noted on right hip.  Musculoskeletal: No clubbing cyanosis or edema.  Data Reviewed: Basic Metabolic Panel:  Recent Labs Lab 08/13/15 0518 08/14/15 0221 08/15/15 0422 08/16/15 1610 08/17/15 0617 08/18/15 0832  NA 130* 130* 137 134* 136 136  K 3.6 3.8 4.2 3.8 4.0 4.3  CL 95* 98* 102 99* 101 101  CO2 GLUCOSE 118* 129* 118* 111* 113* 123*  BUN <5* <5* <5* <5* <5* <5*  CREATININE 0.35* 0.49* 0.62 0.53* 0.49* 0.48*  CALCIUM 8.4* 8.0* 8.3* 8.5* 8.7* 8.6*  MG 1.4* 1.8 1.7 1.6* 1.8  --    Liver Function Tests:  Recent Labs Lab 08/13/15 0518 08/14/15 0221 08/15/15 0422 08/16/15 0312 08/17/15 0617  AST ALT 9* 13* 11* 11* 10*  ALKPHOS 177* 208* 197* 205* 186*  BILITOT 0.5 0.6 0.7 0.7 0.2*  PROT 5.5* 5.9* 5.8* 6.3* 6.2*  ALBUMIN 2.2* 2.2* 2.2* 2.3* 2.2*   No results for input(s): LIPASE, AMYLASE in the last 168 hours. No results for input(s): AMMONIA in the last 168 hours. CBC:  Recent Labs Lab 08/12/15 0540 08/13/15 0518 08/14/15 0221 08/15/15 0422 08/16/15 0312 08/17/15 0617 08/18/15 0832  WBC 7.3 6.8 7.7 6.5 7.5 8.6 9.0  NEUTROABS 4.7 4.6 5.4 4.7 5.4  --   --   HGB 7.7* 7.8* 7.9* 8.1* 8.3* 7.9* 7.7*  HCT 22.8* 23.7* 24.2* 26.1* 27.0* 24.3* 24.9*  MCV 77.6* 79.3 77.6* 78.6 79.4 78.6 78.3  PLT 346 388 452* 441* 564* 538* 565*   Cardiac Enzymes: No results for input(s): CKTOTAL,  CKMB, CKMBINDEX, TROPONINI in the last 168 hours. BNP (last 3 results)  Recent Labs  06/11/15 1106 07/02/15 0538  BNP 27.7 34.0    ProBNP (last 3 results) No results for input(s): PROBNP in the last 8760 hours.  CBG:  Recent Labs Lab 08/17/15 2213 08/18/15 0635 08/18/15 1201 08/18/15 1453 08/18/15 1652  GLUCAP 139* 112* 93 141* 75    Recent Results (from the past 240 hour(s))  Culture, blood (Routine X 2) w Reflex to ID Panel     Status: None   Collection Time: 08/09/15 12:09 PM  Result Value Ref Range Status   Specimen Description BLOOD BLOOD RIGHT FOREARM  Final   Special Requests IN PEDIATRIC BOTTLE .3CC  Final   Culture NO GROWTH 5 DAYS  Final   Report Status 08/14/2015 FINAL  Final  Culture, blood (Routine X 2) w Reflex to ID Panel     Status: None   Collection Time: 08/09/15 12:15 PM  Result Value Ref Range Status   Specimen Description BLOOD RIGHT ANTECUBITAL  Final   Special Requests BOTTLES DRAWN AEROBIC ONLY 6CC  Final   Culture NO GROWTH 5 DAYS  Final   Report Status 08/14/2015 FINAL  Final  Culture,  routine-abscess     Status: None   Collection Time: 08/13/15 12:16 PM  Result Value Ref Range Status   Specimen Description OTHER  Final   Special Requests LEFT SCAPULA  Final   Gram Stain   Final    RARE WBC NO SQUAMOUS EPITHELIAL CELLS SEEN NO ORGANISMS SEEN Performed at [facility]    Culture   Final    NO GROWTH 2 DAYS Performed at [facility]    Report Status 08/15/2015 FINAL  Final  C difficile quick scan w PCR reflex     Status: None   Collection Time: 08/17/15  1:47 PM  Result Value Ref Range Status   C Diff antigen NEGATIVE NEGATIVE Final   C Diff toxin NEGATIVE NEGATIVE Final   C Diff interpretation Negative for toxigenic C. difficile  Final     Studies: No results found.  Scheduled Meds: . sodium chloride   Intravenous Once  . aspirin EC  325 mg Oral Daily  . baclofen  5 mg Oral TID  . diazepam  5 mg Oral QID   . docusate sodium  100 mg Oral BID  . feeding supplement (ENSURE ENLIVE)  237 mL Oral TID BM  . gabapentin  300 mg Oral TID  . insulin aspart  0-5 Units Subcutaneous QHS  . insulin aspart  0-9 Units Subcutaneous TID WC  . magnesium oxide  200 mg Oral BID  . rifampin  600 mg Oral Daily  . sodium chloride flush  10-40 mL Intracatheter Q12H  . tamsulosin  0.4 mg Oral Daily  . vancomycin  1,500 mg Intravenous Q12H   Continuous Infusions: . sodium chloride 50 mL/hr at 08/18/15 1809    Principal Problem:   Sepsis (HCC) Active Problems:   UTI (lower urinary tract infection)   DM type 2, goal HbA1c < 7% (HCC)   Chronic hyponatremia   Infection   Hip fracture requiring operative repair (HCC)   MRSA bacteremia   Staphylococcus aureus bacteremia with sepsis (HCC)   Osteomyelitis of right hip (HCC)   Hardware complicating wound infection (HCC)   Sepsis due to methicillin resistant Staphylococcus aureus (MRSA) (HCC)   Uncontrolled type 2 diabetes mellitus with complication (HCC)   Acute urinary retention   Low back pain   Muscle spasms of both lower extremities   Controlled diabetes mellitus type 2 with complications (HCC)   Hypokalemia   Acute pyelonephritis   Abscess   Chest wall abscess   Pleural effusion   Gluteal abscess   Diabetic foot ulcer (HCC)    Time spent: 35 minutes    Ramiz Turpin M.D. [facility] Pager [phone]. If 7PM-7AM, please contact night-coverage at [email or web address], password [facility] 08/18/2015, 9:27 PM  LOS: 11 days

## 2015-08-18 NOTE — Transfer of Care (Signed)
Immediate Anesthesia Transfer of Care Note  Patient: Jeff Ferrell  Procedure(s) Performed: Procedure(s): IRRIGATION AND DEBRIDEMENT RIGHT HIP, WOUND VAC (Right)  Patient Location: PACU  Anesthesia Type:General  Level of Consciousness: awake, alert , oriented and patient cooperative  Airway & Oxygen Therapy: Patient Spontanous Breathing and Patient connected to nasal cannula oxygen  Post-op Assessment: Report given to RN, Post -op Vital signs reviewed and stable and Patient moving all extremities  Post vital signs: Reviewed and stable  Last Vitals:  Filed Vitals:   08/18/15 1000 08/18/15 1419  BP: 145/81 133/75  Pulse: 95 90  Temp:  37.2 C  Resp:  16    Complications: No apparent anesthesia complications

## 2015-08-19 ENCOUNTER — Encounter (HOSPITAL_COMMUNITY): Payer: Self-pay | Admitting: Orthopaedic Surgery

## 2015-08-19 LAB — TYPE AND SCREEN
ABO/RH(D): B POS
Antibody Screen: NEGATIVE
UNIT DIVISION: 0
UNIT DIVISION: 0

## 2015-08-19 LAB — BASIC METABOLIC PANEL
ANION GAP: 10 (ref 5–15)
CHLORIDE: 97 mmol/L — AB (ref 101–111)
CO2: 25 mmol/L (ref 22–32)
Calcium: 8.3 mg/dL — ABNORMAL LOW (ref 8.9–10.3)
Creatinine, Ser: 0.59 mg/dL — ABNORMAL LOW (ref 0.61–1.24)
Glucose, Bld: 134 mg/dL — ABNORMAL HIGH (ref 65–99)
POTASSIUM: 4.2 mmol/L (ref 3.5–5.1)
SODIUM: 132 mmol/L — AB (ref 135–145)

## 2015-08-19 LAB — CBC
HCT: 28.1 % — ABNORMAL LOW (ref 39.0–52.0)
HEMOGLOBIN: 9.4 g/dL — AB (ref 13.0–17.0)
MCH: 26.7 pg (ref 26.0–34.0)
MCHC: 33.5 g/dL (ref 30.0–36.0)
MCV: 79.8 fL (ref 78.0–100.0)
PLATELETS: 526 10*3/uL — AB (ref 150–400)
RBC: 3.52 MIL/uL — AB (ref 4.22–5.81)
RDW: 15 % (ref 11.5–15.5)
WBC: 13 10*3/uL — AB (ref 4.0–10.5)

## 2015-08-19 LAB — GLUCOSE, CAPILLARY
GLUCOSE-CAPILLARY: 142 mg/dL — AB (ref 65–99)
GLUCOSE-CAPILLARY: 191 mg/dL — AB (ref 65–99)
Glucose-Capillary: 139 mg/dL — ABNORMAL HIGH (ref 65–99)
Glucose-Capillary: 204 mg/dL — ABNORMAL HIGH (ref 65–99)

## 2015-08-19 LAB — TROPONIN I: Troponin I: <0.03 ng/mL (ref <0.031)

## 2015-08-19 MED FILL — Propofol IV Emul 200 MG/20ML (10 MG/ML): INTRAVENOUS | Qty: 20 | Status: AC

## 2015-08-19 MED FILL — Midazolam HCl Inj 2 MG/2ML (Base Equivalent): INTRAMUSCULAR | Qty: 2 | Status: AC

## 2015-08-19 MED FILL — Glycopyrrolate Inj 0.2 MG/ML: INTRAMUSCULAR | Qty: 1 | Status: AC

## 2015-08-19 MED FILL — Lidocaine HCl IV Inj 20 MG/ML: INTRAVENOUS | Qty: 5 | Status: AC

## 2015-08-19 MED FILL — Lactated Ringer's Solution: INTRAVENOUS | Qty: 1000 | Status: AC

## 2015-08-19 MED FILL — Fentanyl Citrate Preservative Free (PF) Inj 100 MCG/2ML: INTRAMUSCULAR | Qty: 2 | Status: AC

## 2015-08-19 MED FILL — Succinylcholine Chloride Inj 20 MG/ML: INTRAMUSCULAR | Qty: 5 | Status: AC

## 2015-08-19 NOTE — Progress Notes (Signed)
   08/19/15 1108  PT Visit Information  Last PT Received On 08/19/15  Reason Eval/Treat Not Completed Other (comment) (change in medical status, Pt to OR yesterday and received R hip I&D washout and debridement of R gluteal abscess.  Per MD pt WBAT on R.  Will require re-assessment from supervising PT post surgery and change in weight bearing status.  will inform PT. )

## 2015-08-19 NOTE — Progress Notes (Signed)
[facility] PROGRESS NOTE  Jeff Ferrell WGN:[id number] DOB: 07/11/1950 DOA: 08/07/2015 PCP: [name], MD  Assessment/Plan: Left chest pain: check EKG, monitor on tele. 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 To SNF 1-2 days if stable and pain better controlled  Left chest wall Phlegmon / Abscess -2/24/ S/P IR aspiration. Continue current antibiotics. ID following.  R gluteal muscle fluid collection  -Orthopedics following, status post incision and drainage of right gluteal abscess, 08/18/2015 per orthopedics. Continue current antibiotics. Orthopedics and ID consulted  Muscle spasms bilateral lower extremity No complaints today  DM2 -CBG currently controlled - follow w/ SSI - A1c 5.7 on 2/21  Chronic Hyponatremia -Workup accomplished during last hospital stay concluded hypovolemia was etiology - stable at present - follow   Acute urinary retention  Voiding trial in am  Normocytic anemia   transfused 2 units perioperatively today 08/18/2015. Follow H&H.  Code Status: Full Family Communication: Updated patient. No family at bedside. Disposition Plan: SNF soon if labs and pain ok   Consultants:  Orthopedics: Dr. August Saucer 08/07/2015  Infectious diseases: Dr. Ninetta Lights 08/08/2015  Interventional radiology  Procedures: 2/19 - I&D infected R hip wound  2/20 1 units packed red blood cells transfused 2/23 MRI T-spine/L-spine;-Massively distended urinary bladder, causing 2dary  Hydronephrosis. -No evidence of acute thoracic or lumbar spinal infection. -Rim enhancing fluid collections in the posterior left chest wall fluid - associated with multiple displaced rib fractures - and in the right gluteal muscle fluid collection are suspicious for phlegmon/abscess - Lt >>Rt pleural effusions with bilateral lower lobe pneumonia. -Degenerative thoracic spinal stenosis Stable degenerative lumbar spinal stenosis; moderate degenerative cervical spinal stenosis  2/24 TEE EF= 60% to 65%. WMN - negative valvular thrombus - Pericardium, extracardiac: A trivial pericardial effusion  2/24Lt scapula fluid aspiration;3 cc serosanguinous fluid 2/27 - L UE PICC in IR  3/1- 2 units packed red blood cells transfused 3/1-1. 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 Incision and drainage of right gluteal abscess 3. Partial secondary closure of previous surgical incision 4. Application of wound VAC greater than 50 cm------ per Dr.Xu   Antibiotics: Aztreonam 2/18>>>2/19 Vancomycin 2/18 > Ceftriaxone 2/19 >>>>2/20 Rifampin 2/19 >    HPI/Subjective: C/o left CP  Objective: Filed Vitals:   08/19/15 0300 08/19/15 1500  BP:  122/56  Pulse: 109 110  Temp: 99.8 F (37.7 C) 99.5 F (37.5 C)  Resp: 14 16    Intake/Output Summary (Last 24 hours) at 08/19/15 1847 Last data filed at 08/19/15 1300  Gross per 24 hour  Intake    560 ml  Output   3125 ml  Net  -2565 ml   Filed Weights   08/09/15 0255  Weight: 73.1 kg (161 lb 2.5 oz)    Exam:   General:  uncomfortable  Cardiovascular: Regular rate rhythm no murmurs rubs or gallops  Respiratory: Clear to auscultation bilaterally anterior lung fields.  Abdomen: Soft, nontender, nondistended,  positive bowel sounds. Wound VAC noted on right hip.  Musculoskeletal: No clubbing cyanosis or edema.  Data Reviewed: Basic Metabolic Panel:  Recent  Labs Lab 08/13/15 0518 08/14/15 0221 08/15/15 0422 08/16/15 5366 08/17/15 0617 08/18/15 0832 08/19/15 0445  NA 130* 130* 137 134* 136 136 132*  K 3.6 3.8 4.2 3.8 4.0 4.3 4.2  CL 95* 98* 102 99* 101 101 97*  CO2 GLUCOSE 118* 129* 118* 111* 113* 123* 134*  BUN <5* <5* <5* <5* <5* <5* <5*  CREATININE 0.35* 0.49* 0.62 0.53* 0.49* 0.48* 0.59*  CALCIUM 8.4* 8.0* 8.3* 8.5* 8.7* 8.6* 8.3*  MG 1.4* 1.8 1.7 1.6* 1.8  --   --    Liver Function Tests:  Recent Labs Lab 08/13/15 0518 08/14/15 0221 08/15/15 0422 08/16/15 0312 08/17/15 0617  AST ALT 9* 13* 11* 11* 10*  ALKPHOS 177* 208* 197* 205* 186*  BILITOT 0.5 0.6 0.7 0.7 0.2*  PROT 5.5* 5.9* 5.8* 6.3* 6.2*  ALBUMIN 2.2* 2.2* 2.2* 2.3* 2.2*   No results for input(s): LIPASE, AMYLASE in the last 168 hours. No results for input(s): AMMONIA in the last 168 hours. CBC:  Recent Labs Lab 08/13/15 0518 08/14/15 0221 08/15/15 0422 08/16/15 4403 08/17/15 0617 08/18/15 0832 08/19/15 0445  WBC 6.8 7.7 6.5 7.5 8.6 9.0 13.0*  NEUTROABS 4.6 5.4 4.7 5.4  --   --   --   HGB 7.8* 7.9* 8.1* 8.3* 7.9* 7.7* 9.4*  HCT 23.7* 24.2* 26.1* 27.0* 24.3* 24.9* 28.1*  MCV 79.3 77.6* 78.6 79.4 78.6 78.3 79.8  PLT 388 452* 441* 564* 538* 565* 526*   Cardiac Enzymes: No results for input(s): CKTOTAL, CKMB, CKMBINDEX, TROPONINI in the last 168 hours. BNP (last 3 results)  Recent Labs  06/11/15 1106 07/02/15 0538  BNP 27.7 34.0    ProBNP (last 3 results) No results for input(s): PROBNP in the last 8760 hours.  CBG:  Recent Labs Lab 08/18/15 1652 08/18/15 2137 08/19/15 0436 08/19/15 1130 08/19/15 1740  GLUCAP 75 117* 142* 139* 191*    Recent Results (from the past 240 hour(s))  Culture, routine-abscess     Status: None   Collection Time: 08/13/15 12:16 PM  Result Value Ref Range Status   Specimen Description OTHER  Final   Special Requests LEFT SCAPULA  Final   Gram Stain   Final    RARE  WBC NO SQUAMOUS EPITHELIAL CELLS SEEN NO ORGANISMS SEEN Performed at [facility]    Culture   Final    NO GROWTH 2 DAYS Performed at [facility]    Report Status 08/15/2015 FINAL  Final  C difficile quick scan w PCR reflex     Status: None   Collection Time: 08/17/15  1:47 PM  Result Value Ref Range Status   C Diff antigen NEGATIVE NEGATIVE Final   C Diff toxin NEGATIVE NEGATIVE Final   C Diff interpretation Negative for toxigenic C. difficile  Final     Studies: No results found.  Scheduled Meds: . sodium chloride   Intravenous Once  . aspirin EC  325 mg Oral Daily  . baclofen  5 mg Oral TID  . diazepam  5 mg Oral QID  . docusate sodium  100 mg Oral BID  . feeding supplement (ENSURE ENLIVE)  237 mL Oral TID BM  . gabapentin  300 mg Oral TID  . insulin aspart  0-5 Units Subcutaneous QHS  .  insulin aspart  0-9 Units Subcutaneous TID WC  . magnesium oxide  200 mg Oral BID  . rifampin  600 mg Oral Daily  . sodium chloride flush  10-40 mL Intracatheter Q12H  . tamsulosin  0.4 mg Oral Daily  . vancomycin  1,500 mg Intravenous Q12H   Continuous Infusions: . sodium chloride 50 mL/hr at 08/18/15 1809    Principal Problem:   Sepsis (HCC) Active Problems:   UTI (lower urinary tract infection)   DM type 2, goal HbA1c < 7% (HCC)   Chronic hyponatremia   Infection   Hip fracture requiring operative repair (HCC)   MRSA bacteremia   Staphylococcus aureus bacteremia with sepsis (HCC)   Osteomyelitis of right hip (HCC)   Hardware complicating wound infection (HCC)   Sepsis due to methicillin resistant Staphylococcus aureus (MRSA) (HCC)   Uncontrolled type 2 diabetes mellitus with complication (HCC)   Acute urinary retention   Low back pain   Muscle spasms of both lower extremities   Controlled diabetes mellitus type 2 with complications (HCC)   Hypokalemia   Acute pyelonephritis   Abscess   Chest wall abscess   Pleural effusion   Gluteal abscess    Diabetic foot ulcer (HCC)    Time spent: 35 minutes    Alnita Aybar L M.D. [facility] [email or web address], password [facility] 08/19/2015, 6:47 PM  LOS: 12 days

## 2015-08-19 NOTE — Progress Notes (Signed)
Patient ID: Jeff Ferrell, male   DOB: 1951/02/10, 65 y.o.   MRN: [id number]         [facility]    Date of Admission:  08/07/2015   Total days of antibiotics 12         Principal Problem:   Sepsis (HCC) Active Problems:   UTI (lower urinary tract infection)   DM type 2, goal HbA1c < 7% (HCC)   Chronic hyponatremia   Infection   Hip fracture requiring operative repair (HCC)   MRSA bacteremia   Staphylococcus aureus bacteremia with sepsis (HCC)   Osteomyelitis of right hip (HCC)   Hardware complicating wound infection (HCC)   Sepsis due to methicillin resistant Staphylococcus aureus (MRSA) (HCC)   Uncontrolled type 2 diabetes mellitus with complication (HCC)   Acute urinary retention   Low back pain   Muscle spasms of both lower extremities   Controlled diabetes mellitus type 2 with complications (HCC)   Hypokalemia   Acute pyelonephritis   Abscess   Chest wall abscess   Pleural effusion   Gluteal abscess   Diabetic foot ulcer (HCC)   . sodium chloride   Intravenous Once  . aspirin EC  325 mg Oral Daily  . baclofen  5 mg Oral TID  . diazepam  5 mg Oral QID  . docusate sodium  100 mg Oral BID  . feeding supplement (ENSURE ENLIVE)  237 mL Oral TID BM  . gabapentin  300 mg Oral TID  . insulin aspart  0-5 Units Subcutaneous QHS  . insulin aspart  0-9 Units Subcutaneous TID WC  . magnesium oxide  200 mg Oral BID  . rifampin  600 mg Oral Daily  . sodium chloride flush  10-40 mL Intracatheter Q12H  . tamsulosin  0.4 mg Oral Daily  . vancomycin  1,500 mg Intravenous Q12H    SUBJECTIVE: He is still suffering with severe pain and muscle spasms from his right knee to his right groin. He underwent repeat I&D of his right hip wound and had a right gluteal abscess drained yesterday.  Review of Systems: Review of Systems  Constitutional: Positive for malaise/fatigue. Negative for fever, chills, weight loss and diaphoresis.  HENT: Negative for sore  throat.   Respiratory: Negative for cough, sputum production and shortness of breath.   Cardiovascular: Negative for chest pain.  Gastrointestinal: Positive for abdominal pain. Negative for nausea, vomiting and diarrhea.  Musculoskeletal: Positive for back pain and joint pain. Negative for myalgias.  Skin: Negative for rash.  Neurological: Positive for weakness. Negative for focal weakness and headaches.    Past Medical History  Diagnosis Date  . Diabetes mellitus     regulated by diet  . Chronic back pain   . 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 Diabetic osteomyelitis (HCC) 03/29/2015  . Diabetic foot ulcer (HCC) 03/29/2015    Social History  Substance Use Topics  . Smoking status: Never Smoker   . Smokeless tobacco: None  . Alcohol Use: 3.6 oz/week    6 Cans of beer per week     Comment: daily    History reviewed. No pertinent family history. Allergies  Allergen Reactions  . Penicillins Hives and Rash    Has patient had a PCN reaction causing immediate rash, facial/tongue/throat swelling, SOB or lightheadedness with hypotension: Yes Has patient had a PCN reaction causing severe rash involving mucus membranes or skin necrosis: Yes   Has patient had a PCN reaction that required hospitalization No  Has patient had a PCN reaction occurring within the last 10 years: No If all of the above answers are "NO", then may proceed with Cephalosporin use.     OBJECTIVE: Filed Vitals:   08/18/15 1728 08/18/15 1801 08/18/15 1900 08/19/15 0300  BP:  144/85 139/79   Pulse:  97 101 109  Temp: 99 F (37.2 C) 97.8 F (36.6 C) 98.1 F (36.7 C) 99.8 F (37.7 C)  TempSrc:  Oral Oral Oral  Resp:  Height:      Weight:      SpO2: 100% 96% 100% 96%   Body mass index is 23.79 kg/(m^2).  Physical Exam  Constitutional: He is oriented to person, place, and time.  He is alert and focused on his pain.  Eyes: Conjunctivae are normal.  Neck: Neck supple.  Cardiovascular:  Normal rate and regular rhythm.   No murmur heard. Pulmonary/Chest: Breath sounds normal.  Band-Aid over her left posterior chest where aspirate was performed 5 days ago.  Abdominal: Soft. There is tenderness.  Mild tenderness with palpation in his right groin. No mass noted.  Musculoskeletal:  He has a VAC dressing on his right hip wound.  Neurological: He is alert and oriented to person, place, and time.  Skin: No rash noted.  Psychiatric: Mood and affect normal.    Lab Results Lab Results  Component Value Date   WBC 13.0* 08/19/2015   HGB 9.4* 08/19/2015   HCT 28.1* 08/19/2015   MCV 79.8 08/19/2015   PLT 526* 08/19/2015    Lab Results  Component Value Date   CREATININE 0.59* 08/19/2015   BUN <5* 08/19/2015   NA 132* 08/19/2015   K 4.2 08/19/2015   CL 97* 08/19/2015   CO2 25 08/19/2015    Lab Results  Component Value Date   ALT 10* 08/17/2015   AST 20 08/17/2015   ALKPHOS 186* 08/17/2015   BILITOT 0.2* 08/17/2015   SED RATE (mm/hr)  Date Value  08/07/2015 100*  07/01/2015 40*   CRP (mg/dL)  Date Value  08/65/7846 22.8*  07/01/2015 3.4*     Microbiology: Recent Results (from the past 240 hour(s))  Culture, routine-abscess     Status: None   Collection Time: 08/13/15 12:16 PM  Result Value Ref Range Status   Specimen Description OTHER  Final   Special Requests LEFT SCAPULA  Final   Gram Stain   Final    RARE WBC NO SQUAMOUS EPITHELIAL CELLS SEEN NO ORGANISMS SEEN Performed at [facility]    Culture   Final    NO GROWTH 2 DAYS Performed at [facility]    Report Status 08/15/2015 FINAL  Final  C difficile quick scan w PCR reflex     Status: None   Collection Time: 08/17/15  1:47 PM  Result Value Ref Range Status   C Diff antigen NEGATIVE NEGATIVE Final   C Diff toxin NEGATIVE NEGATIVE Final   C Diff interpretation Negative for toxigenic C. difficile  Final     ASSESSMENT: He has probable disseminated MRSA infection with  bacteremia complicating his recent right hip surgery. He will need to stay on vancomycin and rifampin for at least 30 more days through 09/18/2015. We will need to reassess his clinical progress, inflammatory markers and hip fracture healing at that point and determine if he needs to stay on a longer course of IV or oral antibiotic therapy.  PLAN: 1. Continue current antibiotics through 09/18/2015 2. I will arrange  follow-up in my clinic at the end of this month 3. I will sign off now  Cliffton Asters, MD [facility] [phone] pager   [phone] cell 08/19/2015, 12:31 PM

## 2015-08-19 NOTE — Progress Notes (Signed)
Physical Therapy Treatment Patient Details Name: Jeff Ferrell MRN: [id number] DOB: May 04, 1951 Today's Date: 08/19/2015    History of Present Illness Pt is a 65 y/o M w/ recent fx of Rt femoral neck w/ repair on 07/21/15 who presents w/ wound infection, now s/p I&D.  On 3/1 pt underwent repeat I&D Rt hip and had a Rt gluteal abscess drained.PT w/ MRSA UTI and tachycardia.  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    PT Comments    Mr. Goldammer continues to demonstrate Bil LE spasms w/ associated severe pain, limiting his mobility.  He is very motivated to regain his independence.  He currently requires +2 max assist for transfer to chair.  SNF recommendation at d/c remains appropriate.   Follow Up Recommendations  SNF;Supervision/Assistance - 24 hour     Equipment Recommendations  None recommended by PT    Recommendations for Other Services       Precautions / Restrictions Precautions Precautions: Fall Precaution Comments: Wound vac; Bil LE extreme spasms with bil knee flexion maintained throughout, R>L Restrictions Weight Bearing Restrictions: Yes RLE Weight Bearing: Weight bearing as tolerated LLE Weight Bearing: Weight bearing as tolerated    Mobility  Bed Mobility Overal bed mobility: Needs Assistance Bed Mobility: Supine to Sit     Supine to sit: Min assist;+2 for physical assistance;HOB elevated     General bed mobility comments: Assist managing Bil LEs, pt w/ heavy use of bed rail and requires assist to elevate trunk.    Transfers Overall transfer level: Needs assistance Equipment used: 2 person hand held assist Transfers: Sit to/from [employer] Sit to Stand: +2 physical assistance;+2 safety/equipment;Max assist Stand pivot transfers: Max assist;+2 physical assistance;+2 safety/equipment       General transfer comment: Cues for hand placement and technique.  Assist provided to boost pt from  bed and to facilitate upright standing which pt is unable to achieve due to pain.    Ambulation/Gait                 Stairs            Wheelchair Mobility    Modified Rankin (Stroke Patients Only)       Balance Overall balance assessment: Needs assistance Sitting-balance support: Bilateral upper extremity supported;Feet supported Sitting balance-Leahy Scale: Fair     Standing balance support: Bilateral upper extremity supported;During functional activity Standing balance-Leahy Scale: Zero                      Cognition Arousal/Alertness: Awake/alert Behavior During Therapy: WFL for tasks assessed/performed Overall Cognitive Status: Within Functional Limits for tasks assessed                      Exercises Other Exercises Other Exercises: Contract relax to promote Bil knee extension and Bil hip abduction. x10    General Comments        Pertinent Vitals/Pain Pain Assessment: Faces Faces Pain Scale: Hurts whole lot Pain Location: bil LE w/ spasms Pain Descriptors / Indicators: Tightness;Spasm;Moaning;Grimacing Pain Intervention(s): Limited activity within patient's tolerance;Monitored during session;Repositioned;Premedicated before session    Home Living                      Prior Function            PT Goals (current goals can now be found in the care plan section) Acute Rehab PT Goals  Patient Stated Goal: be able to stand PT Goal Formulation: With patient Time For Goal Achievement: 08/27/15 Potential to Achieve Goals: Fair Progress towards PT goals: Progressing toward goals    Frequency  Min 3X/week    PT Plan Current plan remains appropriate    Co-evaluation             End of Session Equipment Utilized During Treatment: Gait belt Activity Tolerance: Patient tolerated treatment well;Patient limited by pain Patient left: with call bell/phone within reach;in chair;with chair alarm set     Time:  1344-1413 PT Time Calculation (min) (ACUTE ONLY): 29 min  Charges:  $Therapeutic Exercise: 8-22 mins $Therapeutic Activity: 8-22 mins                    G Codes:      Encarnacion Chu PT, DPT  Pager: [phone] Phone: [phone] 08/19/2015, 2:33 PM

## 2015-08-19 NOTE — Progress Notes (Signed)
Patient is stable with VAC in place. Given the patient's poor protoplasm, will allow surgical wound to heal by secondary intention with VAC changes every MWF.  Needs home VAC and will need VAC changes at the SNF.  I will see the patient back in 2 weeks for a wound check.  Antibiotics per ID recs. WBAT BLEMayra Reel, MD [facility] [phone] 8:00 AM

## 2015-08-19 NOTE — Anesthesia Postprocedure Evaluation (Signed)
Anesthesia Post Note  Patient: Jeff Ferrell  Procedure(s) Performed: Procedure(s) (LRB): IRRIGATION AND DEBRIDEMENT RIGHT HIP, WOUND VAC (Right)  Patient location during evaluation: PACU Anesthesia Type: General Level of consciousness: awake and alert Pain management: pain level controlled Vital Signs Assessment: post-procedure vital signs reviewed and stable Respiratory status: spontaneous breathing, nonlabored ventilation, respiratory function stable and patient connected to nasal cannula oxygen Cardiovascular status: blood pressure returned to baseline and stable Postop Assessment: no signs of nausea or vomiting Anesthetic complications: no    Last Vitals:  Filed Vitals:   08/18/15 1900 08/19/15 0300  BP: 139/79   Pulse: 101 109  Temp: 36.7 C 37.7 C  Resp: 14 14    Last Pain:  Filed Vitals:   08/19/15 0609  PainSc: Asleep                 Maureena Dabbs DAVID

## 2015-08-20 LAB — TROPONIN I: Troponin I: <0.03 ng/mL (ref <0.031)

## 2015-08-20 LAB — URINALYSIS, ROUTINE W REFLEX MICROSCOPIC
BILIRUBIN URINE: NEGATIVE
GLUCOSE, UA: NEGATIVE mg/dL
HGB URINE DIPSTICK: NEGATIVE
KETONES UR: NEGATIVE mg/dL
Nitrite: NEGATIVE
PROTEIN: NEGATIVE mg/dL
Specific Gravity, Urine: 1.012 (ref 1.005–1.030)
pH: 7 (ref 5.0–8.0)

## 2015-08-20 LAB — CBC
HEMATOCRIT: 27.8 % — AB (ref 39.0–52.0)
HEMOGLOBIN: 9.1 g/dL — AB (ref 13.0–17.0)
MCH: 25.7 pg — ABNORMAL LOW (ref 26.0–34.0)
MCHC: 32.7 g/dL (ref 30.0–36.0)
MCV: 78.5 fL (ref 78.0–100.0)
Platelets: 499 10*3/uL — ABNORMAL HIGH (ref 150–400)
RBC: 3.54 MIL/uL — AB (ref 4.22–5.81)
RDW: 14.9 % (ref 11.5–15.5)
WBC: 14.5 10*3/uL — AB (ref 4.0–10.5)

## 2015-08-20 LAB — URINE MICROSCOPIC-ADD ON: RBC / HPF: NONE SEEN RBC/hpf (ref 0–5)

## 2015-08-20 LAB — GLUCOSE, CAPILLARY
GLUCOSE-CAPILLARY: 149 mg/dL — AB (ref 65–99)
GLUCOSE-CAPILLARY: 172 mg/dL — AB (ref 65–99)
GLUCOSE-CAPILLARY: 110 mg/dL — AB (ref 65–99)
Glucose-Capillary: 167 mg/dL — ABNORMAL HIGH (ref 65–99)

## 2015-08-20 MED ORDER — BACLOFEN 10 MG PO TABS
10.0000 mg | ORAL_TABLET | Freq: Three times a day (TID) | ORAL | Status: DC
  Administered 2015-08-20 – 2015-08-23 (×8): 10 mg via ORAL
  Filled 2015-08-20 (×9): qty 1

## 2015-08-20 NOTE — Progress Notes (Signed)
Pharmacy Antibiotic Note  Guinevere FerrariHarold Sanjurjo is a 65 y.o. male admitted on 08/07/2015 with MRSA bacteremia, R hip osteo, and multiple abscesses concerning for infection.  Now day #13 of abx for MRSA bacteremia w/ R hip osteo w/ hardware (s/p I&D 2/19; hip was repaired on 2/1). TEE neg vegetations. Per ID: continue IV vancomycin and rifampin x 8 weeks postop (thru 09/18/15) then will need 10 months plus of oral suppressive abx. Has had repeated I&D's with last one being on 3/1. Last SCr on 3/1 was stable. Next BMet ordered for tomorrow.  Plan: Continue vancomycin 1500mg  IV q12h Continue rifampin 600mg  PO daily Monitor clinical picture, renal function, VT weekly (3/6 or 3/7) F/U C&S, abx deescalation / LOT  Height: 5\' 9"  (175.3 cm) Weight: 161 lb 2.5 oz (73.1 kg) IBW/kg (Calculated) : 70.7  Temp (24hrs), Avg:99.6 F (37.6 C), Min:98.3 F (36.8 C), Max:100.8 F (38.2 C)   Recent Labs Lab 08/14/15 1252 08/15/15 0422 08/16/15 0312 08/17/15 0617 08/17/15 0952 08/18/15 0832 08/19/15 0445 08/20/15 0200  WBC  --  6.5 7.5 8.6  --  9.0 13.0* 14.5*  CREATININE  --  0.62 0.53* 0.49*  --  0.48* 0.59*  --   VANCOTROUGH 25*  --   --   --  18  --   --   --     Estimated Creatinine Clearance: 90.8 mL/min (by C-G formula based on Cr of 0.59).    Allergies  Allergen Reactions  . Penicillins Hives and Rash    Has patient had a PCN reaction causing immediate rash, facial/tongue/throat swelling, SOB or lightheadedness with hypotension: Yes Has patient had a PCN reaction causing severe rash involving mucus membranes or skin necrosis: Yes   Has patient had a PCN reaction that required hospitalization No Has patient had a PCN reaction occurring within the last 10 years: No If all of the above answers are "NO", then may proceed with Cephalosporin use.     Antimicrobials this admission: Azactam 2/18 >>2/19 Rocephin 2/19 >> 2/20 Vancomycin 2/18 >> Rifampin PO 2/19 >>  Dose adjustments this  admission: 2/21: VT = 7 on 1gm q12h, dose increased to 1gm q8h 2/22: VT = 14 on 1gm q8h, dose increased to 1250mg  q8h 2/25: VT = 25 (1hr early) on 1250 q8h, dose changed to 1500mg  q12h 2/28: VT = 18 (on time) on 1500 mg q12h  Microbiology results: 2/18 blood x 2 - MRSA (S: Clinda, Rifampin, TCN, Vanc, MIC = 1, R: Septra, CIpro) 2/18 R leg wound - abundant MRSA 2/19 R leg wound - MRSA 2/19 urine -100k/ml MRSA 2/19 blood x 2 - 1/2 MRSA 2/19 R leg wound, anaerobic x 2 - no anaerobes isolated 2/19 MRSA PCR- POS -> CHG/Bactroban 2/19-20>>2/24 2/20 Blood x 2 - negative 2/24 left scapula abscess - negative  Enzo BiNathan Dietra Stokely, PharmD, BCPS Clinical Pharmacist Pager [phone] 08/20/2015 10:44 AM

## 2015-08-20 NOTE — Progress Notes (Signed)
[facility] PROGRESS NOTE  Jeff Ferrell BJY:[id number] DOB: Jun 06, 1951 DOA: 08/07/2015 PCP: [name], MD  Assessment/Plan: Left chest pain: resolved. MI ruled out. D/c telemetry  Sepsis w/ MRSA Pyelonephritis - MRSA bacteremia - infected R hip post fx repair, right gluteal abscess, left scapular phlegmon, Abscess -taken to the OR for I&D 2/19 - care per ID and Ortho - TEE negative for valvular vegetation - 2/20 blood cultures 2/2 negative > PICC placed 08/17/2015 for long term abx 2/24/ S/P IR aspiration scapular area abscess  08/18/2015 patient to the OR for repeat I and D of right periprosthetic hip infection skin,  incision and drainage of right gluteal abscess, and wound vac. Had temp to 100.8 overnight and WBC creeping up. Will reculture and let ID know  Muscle spasms bilateral lower extremity Has returned. Will increase baclofen  DM2 -CBG currently controlled - follow w/ SSI - A1c 5.7 on 2/21  Chronic Hyponatremia -Workup accomplished during last hospital stay concluded hypovolemia was etiology - stable at present - follow   Acute urinary retention  Voiding trial  Anemia of chronic disease, acute illness: S/p 3 units prbc. monitor  Code Status: Full Family Communication: Updated patient. No family at bedside. Disposition Plan: not ready for SNF due to fever and leukocytosis   Consultants:  Orthopedics: Dr. August Saucer 08/07/2015  Infectious diseases: Dr. Ninetta Lights 08/08/2015  Interventional radiology  Procedures: 2/19 - I&D infected R hip wound  2/20 1 units packed red blood cells transfused 2/23 MRI T-spine/L-spine;-Massively distended urinary bladder, causing 2dary Hydronephrosis. -No evidence of acute thoracic or lumbar spinal infection. -Rim enhancing fluid collections in the posterior left chest wall fluid - associated with multiple displaced rib fractures - and in the right gluteal muscle fluid collection are suspicious for phlegmon/abscess - Lt >>Rt  pleural effusions with bilateral lower lobe pneumonia. -Degenerative thoracic spinal stenosis Stable degenerative lumbar spinal stenosis; moderate degenerative cervical spinal stenosis  2/24 TEE EF= 60% to 65%. WMN - negative valvular thrombus - Pericardium, extracardiac: A trivial pericardial effusion  2/24Lt scapula fluid aspiration;3 cc serosanguinous fluid 2/27 - L UE PICC in IR  3/1- 2 units packed red blood cells transfused 3/1-1. 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 Incision and drainage of right gluteal abscess 3. Partial secondary closure of previous surgical incision 4. Application of wound VAC greater than 50 cm------ per Dr.Xu   Antibiotics: Aztreonam 2/18>>>2/19 Vancomycin 2/18 > Ceftriaxone 2/19 >>>>2/20 Rifampin 2/19 >    HPI/Subjective: CP resolved. Now c/o right leg spasm. No diarrhea  Objective: Filed Vitals:   08/19/15 2349 08/20/15 0500  BP:  127/67  Pulse:  113  Temp: 98.3 F (36.8 C) 99.6 F (37.6 C)  Resp:  16    Intake/Output Summary (Last 24 hours) at 08/20/15 1013 Last data filed at 08/20/15 0500  Gross per 24 hour  Intake 1118.3 ml  Output   4300 ml  Net -3181.7 ml   Filed Weights   08/09/15 0255  Weight: 73.1 kg (161 lb 2.5 oz)    Exam:   General:  More comfortable than yesterday, but periods of wincing due to leg spasm  Cardiovascular: Regular rate rhythm no murmurs rubs or gallops  Respiratory: Clear to auscultation bilaterally anterior lung fields.  Abdomen: Soft, nontender, nondistended, positive bowel sounds. Wound VAC noted on right hip.  Musculoskeletal: right thigh spasm. No edema  Data Reviewed: Basic Metabolic Panel:  Recent Labs Lab 08/14/15 0221  08/15/15 0422 08/16/15 [id number] 08/17/15 0617 08/18/15 0832 08/19/15 0445  NA 130* 137 134* 136 136 132*  K 3.8 4.2 3.8 4.0 4.3 4.2  CL 98* 102 99* 101 101 97*  CO2 24 25 26 25 25 25    GLUCOSE 129* 118* 111* 113* 123* 134*  BUN <5* <5* <5* <5* <5* <5*  CREATININE 0.49* 0.62 0.53* 0.49* 0.48* 0.59*  CALCIUM 8.0* 8.3* 8.5* 8.7* 8.6* 8.3*  MG 1.8 1.7 1.6* 1.8  --   --    Liver Function Tests:  Recent Labs Lab 08/14/15 0221 08/15/15 0422 08/16/15 0312 08/17/15 0617  AST 27 26 20 20   ALT 13* 11* 11* 10*  ALKPHOS 208* 197* 205* 186*  BILITOT 0.6 0.7 0.7 0.2*  PROT 5.9* 5.8* 6.3* 6.2*  ALBUMIN 2.2* 2.2* 2.3* 2.2*   No results for input(s): LIPASE, AMYLASE in the last 168 hours. No results for input(s): AMMONIA in the last 168 hours. CBC:  Recent Labs Lab 08/14/15 0221 08/15/15 0422 08/16/15 [id number] 08/17/15 0617 08/18/15 0832 08/19/15 0445 08/20/15 0200  WBC 7.7 6.5 7.5 8.6 9.0 13.0* 14.5*  NEUTROABS 5.4 4.7 5.4  --   --   --   --   HGB 7.9* 8.1* 8.3* 7.9* 7.7* 9.4* 9.1*  HCT 24.2* 26.1* 27.0* 24.3* 24.9* 28.1* 27.8*  MCV 77.6* 78.6 79.4 78.6 78.3 79.8 78.5  PLT 452* 441* 564* 538* 565* 526* 499*   Cardiac Enzymes:  Recent Labs Lab 08/19/15 2050 08/20/15 0200 08/20/15 0810  TROPONINI <0.03 <0.03 <0.03   BNP (last 3 results)  Recent Labs  06/11/15 1106 07/02/15 0538  BNP 27.7 34.0    ProBNP (last 3 results) No results for input(s): PROBNP in the last 8760 hours.  CBG:  Recent Labs Lab 08/19/15 0436 08/19/15 1130 08/19/15 1740 08/19/15 2143 08/20/15 0612  GLUCAP 142* 139* 191* 204* 149*    Recent Results (from the past 240 hour(s))  Culture, routine-abscess     Status: None   Collection Time: 08/13/15 12:16 PM  Result Value Ref Range Status   Specimen Description OTHER  Final   Special Requests LEFT SCAPULA  Final   Gram Stain   Final    RARE WBC NO SQUAMOUS EPITHELIAL CELLS SEEN NO ORGANISMS SEEN [facility]formed [facility]    Culture   Final    NO GROWTH 2 DAYS Performed at [facility]    Report Status 08/15/2015 FINAL  Final  C difficile quick scan w PCR reflex     Status: None   Collection Time:  08/17/15  1:47 PM  Result Value Ref Range Status   C Diff antigen NEGATIVE NEGATIVE Final   C Diff toxin NEGATIVE NEGATIVE Final   C Diff interpretation Negative for toxigenic C. difficile  Final     Studies: No results found.  Scheduled Meds: . sodium chloride   Intravenous Once  . aspirin EC  325 mg Oral Daily  . baclofen  5 mg Oral TID  . diazepam  5 mg Oral QID  . docusate sodium  100 mg Oral BID  . feeding supplement (ENSURE ENLIVE)  237 mL Oral TID BM  . gabapentin  300 mg Oral TID  . insulin aspart  0-5 Units Subcutaneous QHS  . insulin aspart  0-9 Units Subcutaneous TID WC  . magnesium oxide  200 mg Oral BID  . rifampin  600 mg Oral Daily  . sodium chloride flush  10-40 mL Intracatheter Q12H  . tamsulosin  0.4 mg Oral Daily  . vancomycin  1,500 mg Intravenous Q12H   Continuous Infusions: . sodium chloride 10 mL/hr at 08/19/15 1934    Principal Problem:   Sepsis (HCC) Active Problems:   UTI (lower urinary tract infection)   DM type 2, goal HbA1c < 7% (HCC)   Chronic hyponatremia   Infection   Hip fracture requiring operative repair (HCC)   MRSA bacteremia   Staphylococcus aureus bacteremia with sepsis (HCC)   Osteomyelitis of right hip (HCC)   Hardware complicating wound infection (HCC)   Sepsis due to methicillin resistant Staphylococcus aureus (MRSA) (HCC)   Uncontrolled type 2 diabetes mellitus with complication (HCC)   Acute urinary retention   Low back pain   Muscle spasms of both lower extremities   Controlled diabetes mellitus type 2 with complications (HCC)   Hypokalemia   Acute pyelonephritis   Abscess   Chest wall abscess   Pleural effusion   Gluteal abscess   Diabetic foot ulcer (HCC)    Time spent: 35 minutes    Jeff Ferrell L M.D. [facility] [email or web address], password [facility] 08/20/2015, 10:13 AM  LOS: 13 days

## 2015-08-21 LAB — BASIC METABOLIC PANEL
ANION GAP: 10 (ref 5–15)
BUN: <5 mg/dL — ABNORMAL LOW (ref 6–20)
CO2: 24 mmol/L (ref 22–32)
Calcium: 8.7 mg/dL — ABNORMAL LOW (ref 8.9–10.3)
Chloride: 91 mmol/L — ABNORMAL LOW (ref 101–111)
Creatinine, Ser: 0.56 mg/dL — ABNORMAL LOW (ref 0.61–1.24)
GFR calc Af Amer: >60 mL/min (ref >60)
GFR calc non Af Amer: >60 mL/min (ref >60)
GLUCOSE: 116 mg/dL — AB (ref 65–99)
Potassium: 4.2 mmol/L (ref 3.5–5.1)
Sodium: 125 mmol/L — ABNORMAL LOW (ref 135–145)

## 2015-08-21 LAB — CBC WITH DIFFERENTIAL/PLATELET
Basophils Absolute: 0 10*3/uL (ref 0.0–0.1)
Basophils Relative: 0 %
EOS PCT: 2 %
Eosinophils Absolute: 0.2 10*3/uL (ref 0.0–0.7)
HEMATOCRIT: 26 % — AB (ref 39.0–52.0)
Hemoglobin: 8.7 g/dL — ABNORMAL LOW (ref 13.0–17.0)
LYMPHS ABS: 1.6 10*3/uL (ref 0.7–4.0)
Lymphocytes Relative: 16 %
MCH: 26.3 pg (ref 26.0–34.0)
MCHC: 33.5 g/dL (ref 30.0–36.0)
MCV: 78.5 fL (ref 78.0–100.0)
MONOS PCT: 11 %
Monocytes Absolute: 1.1 10*3/uL — ABNORMAL HIGH (ref 0.1–1.0)
Neutro Abs: 7.3 10*3/uL (ref 1.7–7.7)
Neutrophils Relative %: 71 %
Platelets: 467 10*3/uL — ABNORMAL HIGH (ref 150–400)
RBC: 3.31 MIL/uL — AB (ref 4.22–5.81)
RDW: 15.2 % (ref 11.5–15.5)
WBC: 10.2 10*3/uL (ref 4.0–10.5)

## 2015-08-21 LAB — GLUCOSE, CAPILLARY
GLUCOSE-CAPILLARY: 105 mg/dL — AB (ref 65–99)
GLUCOSE-CAPILLARY: 115 mg/dL — AB (ref 65–99)
Glucose-Capillary: 105 mg/dL — ABNORMAL HIGH (ref 65–99)
Glucose-Capillary: 114 mg/dL — ABNORMAL HIGH (ref 65–99)

## 2015-08-21 LAB — URINE CULTURE: Culture: NO GROWTH

## 2015-08-21 MED ORDER — MORPHINE SULFATE ER 30 MG PO TBCR
30.0000 mg | EXTENDED_RELEASE_TABLET | Freq: Two times a day (BID) | ORAL | Status: DC
  Administered 2015-08-21 – 2015-08-22 (×3): 30 mg via ORAL
  Filled 2015-08-21 (×3): qty 1

## 2015-08-21 NOTE — Progress Notes (Signed)
[facility] PROGRESS NOTE  Jeff FerrariHarold Ferrell NWG:[id number] DOB: 04/03/1951 DOA: 08/07/2015 PCP: Daisy FloroWYMER,ANTOINETTE, MD  Assessment/Plan:  Sepsis w/ MRSA Pyelonephritis - MRSA bacteremia - infected R hip post fx repair, right gluteal abscess, left scapular phlegmon, Abscess -taken to the OR for I&D 2/19 - care per ID and Ortho - TEE negative for valvular vegetation - 2/20 blood cultures 2/2 negative > PICC placed 08/17/2015 for long term abx 2/24/ S/P IR aspiration scapular area abscess  08/18/2015 patient to the OR for repeat I and D of right periprosthetic hip infection skin,  incision and drainage of right gluteal abscess, and wound vac. Low grade fever overnight again. UA neg and repeat cultures negative  Muscle spasms bilateral lower extremity Pain uncontrolled. Add MS contin  DM2 -CBG currently controlled - follow w/ SSI - A1c 5.7 on 2/21  Chronic Hyponatremia Worked up previously. monitor  Acute urinary retention  Voiding ok  Anemia of chronic disease, acute illness: S/p 3 units prbc. monitor  Code Status: Full Family Communication: Updated patient. No family at bedside. Disposition Plan: not ready for SNF due to fever and leukocytosis   Consultants:  Orthopedics: Dr. August Saucerean 08/07/2015  Infectious diseases: Dr. Ninetta LightsHatcher 08/08/2015  Interventional radiology  Procedures: 2/19 - I&D infected R hip wound  2/20 1 units packed red blood cells transfused 2/23 MRI T-spine/L-spine;-Massively distended urinary bladder, causing 2dary Hydronephrosis. -No evidence of acute thoracic or lumbar spinal infection. -Rim enhancing fluid collections in the posterior left chest wall fluid - associated with multiple displaced rib fractures - and in the right gluteal muscle fluid collection are suspicious for phlegmon/abscess - Lt >>Rt pleural effusions with bilateral lower lobe pneumonia. -Degenerative thoracic spinal stenosis Stable degenerative lumbar spinal stenosis; moderate  degenerative cervical spinal stenosis  2/24 TEE EF= 60% to 65%. WMN - negative valvular thrombus - Pericardium, extracardiac: A trivial pericardial effusion  2/24Lt scapula fluid aspiration;3 cc serosanguinous fluid 2/27 - L UE PICC in IR  3/1- 2 units packed red blood cells transfused 3/1-1. 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 Incision and drainage of right gluteal abscess 3. Partial secondary closure of previous surgical incision 4. Application of wound VAC greater than 50 cm------ per Dr.Xu   Antibiotics: Aztreonam 2/18>>>2/19 Vancomycin 2/18 > Ceftriaxone 2/19 >>>>2/20 Rifampin 2/19 >    HPI/Subjective: C/o right groin pain  Objective: Filed Vitals:   08/20/15 2134 08/21/15 0434  BP: 92/59 95/54  Pulse: 119 113  Temp: 100.3 F (37.9 C) 98.1 F (36.7 C)  Resp: 17 17    Intake/Output Summary (Last 24 hours) at 08/21/15 1435 Last data filed at 08/21/15 0600  Gross per 24 hour  Intake    590 ml  Output   1375 ml  Net   -785 ml   Filed Weights   08/09/15 0255  Weight: 73.1 kg (161 lb 2.5 oz)    Exam:   General:  Moaning. Appears uncomfortable  Cardiovascular: Regular rate rhythm no murmurs rubs or gallops  Respiratory: Clear to auscultation bilaterally anterior lung fields.  Abdomen: Soft, nontender, nondistended, positive bowel sounds. Wound VAC noted on right hip.  Musculoskeletal: no CCE  Data Reviewed: Basic Metabolic Panel:  Recent Labs Lab 08/15/15 0422 08/16/15 0312 08/17/15 0617 08/18/15 0832 08/19/15 0445 08/21/15 0508  NA 137 134* 136 136 132* 125*  K 4.2 3.8 4.0 4.3 4.2 4.2  CL 102 99* 101 101 97* 91*  CO2 25 26  GLUCOSE 118* 111* 113* 123* 134* 116*  BUN <5* <5* <5* <5* <5* <5*  CREATININE 0.62 0.53* 0.49* 0.48* 0.59* 0.56*  CALCIUM 8.3* 8.5* 8.7* 8.6* 8.3* 8.7*  MG 1.7 1.6* 1.8  --   --   --    Liver Function Tests:  Recent  Labs Lab 08/15/15 0422 08/16/15 0312 08/17/15 0617  AST ALT 11* 11* 10*  ALKPHOS 197* 205* 186*  BILITOT 0.7 0.7 0.2*  PROT 5.8* 6.3* 6.2*  ALBUMIN 2.2* 2.3* 2.2*   No results for input(s): LIPASE, AMYLASE in the last 168 hours. No results for input(s): AMMONIA in the last 168 hours. CBC:  Recent Labs Lab 08/15/15 0422 08/16/15 1610 08/17/15 0617 08/18/15 0832 08/19/15 0445 08/20/15 0200 08/21/15 0508  WBC 6.5 7.5 8.6 9.0 13.0* 14.5* 10.2  NEUTROABS 4.7 5.4  --   --   --   --  7.3  HGB 8.1* 8.3* 7.9* 7.7* 9.4* 9.1* 8.7*  HCT 26.1* 27.0* 24.3* 24.9* 28.1* 27.8* 26.0*  MCV 78.6 79.4 78.6 78.3 79.8 78.5 78.5  PLT 441* 564* 538* 565* 526* 499* 467*   Cardiac Enzymes:  Recent Labs Lab 08/19/15 2050 08/20/15 0200 08/20/15 0810  TROPONINI <0.03 <0.03 <0.03   BNP (last 3 results)  Recent Labs  06/11/15 1106 07/02/15 0538  BNP 27.7 34.0    ProBNP (last 3 results) No results for input(s): PROBNP in the last 8760 hours.  CBG:  Recent Labs Lab 08/20/15 1145 08/20/15 1641 08/20/15 2202 08/21/15 0633 08/21/15 1308  GLUCAP 172* 167* 110* 105* 114*    Recent Results (from the past 240 hour(s))  Culture, routine-abscess     Status: None   Collection Time: 08/13/15 12:16 PM  Result Value Ref Range Status   Specimen Description OTHER  Final   Special Requests LEFT SCAPULA  Final   Gram Stain   Final    RARE WBC NO SQUAMOUS EPITHELIAL CELLS SEEN NO ORGANISMS SEEN Performed at [facility]    Culture   Final    NO GROWTH 2 DAYS Performed at [facility]    Report Status 08/15/2015 FINAL  Final  C difficile quick scan w PCR reflex     Status: None   Collection Time: 08/17/15  1:47 PM  Result Value Ref Range Status   C Diff antigen NEGATIVE NEGATIVE Final   C Diff toxin NEGATIVE NEGATIVE Final   C Diff interpretation Negative for toxigenic C. difficile  Final  Culture, blood (Routine X 2) w Reflex to ID Panel     Status: None  (Preliminary result)   Collection Time: 08/20/15 11:18 AM  Result Value Ref Range Status   Specimen Description BLOOD BLOOD RIGHT ARM  Final   Special Requests IN PEDIATRIC BOTTLE 3CC  Final   Culture NO GROWTH < 24 HOURS  Final   Report Status PENDING  Incomplete  Culture, blood (Routine X 2) w Reflex to ID Panel     Status: None (Preliminary result)   Collection Time: 08/20/15 11:22 AM  Result Value Ref Range Status   Specimen Description BLOOD BLOOD RIGHT HAND  Final   Special Requests IN PEDIATRIC BOTTLE 3CC  Final   Culture NO GROWTH < 24 HOURS  Final   Report Status PENDING  Incomplete  Culture, Urine     Status: None   Collection Time: 08/20/15 12:11 PM  Result Value Ref Range Status   Specimen Description URINE, CATHETERIZED  Final   Special Requests NONE  Final   Culture NO GROWTH 1 DAY  Final   Report Status 08/21/2015 FINAL  Final     Studies: No results found.  Scheduled Meds: . sodium chloride   Intravenous Once  . aspirin EC  325 mg Oral Daily  . baclofen  10 mg Oral TID  . diazepam  5 mg Oral QID  . docusate sodium  100 mg Oral BID  . feeding supplement (ENSURE ENLIVE)  237 mL Oral TID BM  . gabapentin  300 mg Oral TID  . insulin aspart  0-5 Units Subcutaneous QHS  . insulin aspart  0-9 Units Subcutaneous TID WC  . magnesium oxide  200 mg Oral BID  . rifampin  600 mg Oral Daily  . sodium chloride flush  10-40 mL Intracatheter Q12H  . tamsulosin  0.4 mg Oral Daily  . vancomycin  1,500 mg Intravenous Q12H   Continuous Infusions: . sodium chloride 10 mL/hr at 08/19/15 1934    Principal Problem:   Sepsis (HCC) Active Problems:   UTI (lower urinary tract infection)   DM type 2, goal HbA1c < 7% (HCC)   Chronic hyponatremia   Infection   Hip fracture requiring operative repair (HCC)   MRSA bacteremia   Staphylococcus aureus bacteremia with sepsis (HCC)   Osteomyelitis of right hip (HCC)   Hardware complicating wound infection (HCC)   Sepsis due to  methicillin resistant Staphylococcus aureus (MRSA) (HCC)   Uncontrolled type 2 diabetes mellitus with complication (HCC)   Acute urinary retention   Low back pain   Muscle spasms of both lower extremities   Controlled diabetes mellitus type 2 with complications (HCC)   Hypokalemia   Acute pyelonephritis   Abscess   Chest wall abscess   Pleural effusion   Gluteal abscess   Diabetic foot ulcer (HCC)    Time spent: 35 minutes    Wania Longstreth L M.D. [facility] [email or web address], password [facility] 08/21/2015, 2:35 PM  LOS: 14 days

## 2015-08-22 LAB — GLUCOSE, CAPILLARY
GLUCOSE-CAPILLARY: 73 mg/dL (ref 65–99)
Glucose-Capillary: 91 mg/dL (ref 65–99)
Glucose-Capillary: 85 mg/dL (ref 65–99)
Glucose-Capillary: 76 mg/dL (ref 65–99)

## 2015-08-22 MED ORDER — MORPHINE SULFATE ER 60 MG PO TBCR
60.0000 mg | EXTENDED_RELEASE_TABLET | Freq: Two times a day (BID) | ORAL | Status: DC
  Administered 2015-08-22 – 2015-08-23 (×2): 60 mg via ORAL
  Filled 2015-08-22 (×2): qty 1

## 2015-08-22 MED ORDER — DIAZEPAM 5 MG PO TABS
5.0000 mg | ORAL_TABLET | Freq: Four times a day (QID) | ORAL | Status: DC | PRN
  Administered 2015-08-22 – 2015-08-23 (×2): 5 mg via ORAL
  Filled 2015-08-22 (×2): qty 1

## 2015-08-22 NOTE — Progress Notes (Signed)
[facility] PROGRESS NOTE  Jeff Ferrell ZOX:[id number] DOB: 01/23/1951 DOA: 08/07/2015 PCP: [name], MD  Assessment/Plan:  Sepsis w/ MRSA Pyelonephritis - MRSA bacteremia - infected R hip post fx repair, right gluteal abscess, left scapular phlegmon, Abscess -taken to the OR for I&D 2/19 - care per ID and Ortho - TEE negative for valvular vegetation - 2/20 blood cultures 2/2 negative > PICC placed 08/17/2015 for long term abx 2/24/ S/P IR aspiration scapular area abscess  08/18/2015 patient to the OR for repeat I and D of right periprosthetic hip infection skin,  incision and drainage of right gluteal abscess, and wound vac. fever overnight again. UA neg and repeat cultures negative  Pain Remains difficult to control. Increase MS contin.  DM2 -CBG currently controlled - follow w/ SSI - A1c 5.7 on 2/21  Chronic Hyponatremia Previous workup consistent with Siadh. monitor  Acute urinary retention  resolved  Anemia of chronic disease, acute illness: S/p 3 units prbc. monitor  Code Status: Full Family Communication: Updated patient. No family at bedside. Disposition Plan: not ready for SNF due to fever and leukocytosis   Consultants:  Orthopedics: Dr. August Saucer 08/07/2015  Infectious diseases: Dr. Ninetta Lights 08/08/2015  Interventional radiology  Procedures: 2/19 - I&D infected R hip wound  2/20 1 units packed red blood cells transfused 2/23 MRI T-spine/L-spine;-Massively distended urinary bladder, causing 2dary Hydronephrosis. -No evidence of acute thoracic or lumbar spinal infection. -Rim enhancing fluid collections in the posterior left chest wall fluid - associated with multiple displaced rib fractures - and in the right gluteal muscle fluid collection are suspicious for phlegmon/abscess - Lt >>Rt pleural effusions with bilateral lower lobe pneumonia. -Degenerative thoracic spinal stenosis Stable degenerative lumbar spinal stenosis; moderate degenerative cervical  spinal stenosis  2/24 TEE EF= 60% to 65%. WMN - negative valvular thrombus - Pericardium, extracardiac: A trivial pericardial effusion  2/24Lt scapula fluid aspiration;3 cc serosanguinous fluid 2/27 - L UE PICC in IR  3/1- 2 units packed red blood cells transfused 3/1-1. 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 Incision and drainage of right gluteal abscess 3. Partial secondary closure of previous surgical incision 4. Application of wound VAC greater than 50 cm------ per Dr.Xu   Antibiotics: Aztreonam 2/18>>>2/19 Vancomycin 2/18 > Ceftriaxone 2/19 >>>>2/20 Rifampin 2/19 >    HPI/Subjective: C/o right groin pain  Objective: Filed Vitals:   08/21/15 2145 08/22/15 0621  BP: 128/56 123/65  Pulse: 108 103  Temp: 101.1 F (38.4 C) 98.5 F (36.9 C)  Resp: 18 16    Intake/Output Summary (Last 24 hours) at 08/22/15 1444 Last data filed at 08/22/15 8119  Gross per 24 hour  Intake      0 ml  Output    950 ml  Net   -950 ml   Filed Weights   08/09/15 0255  Weight: 73.1 kg (161 lb 2.5 oz)    Exam:   General:  Moaning. Appears uncomfortable  Cardiovascular: Regular rate rhythm no murmurs rubs or gallops  Respiratory: Clear to auscultation bilaterally anterior lung fields.  Abdomen: Soft, nontender, nondistended, positive bowel sounds. Wound VAC noted on right hip.  Musculoskeletal: no CCE  Data Reviewed: Basic Metabolic Panel:  Recent Labs Lab 08/16/15 0312 08/17/15 0617 08/18/15 0832 08/19/15 0445 08/21/15 0508  NA 134* 136 136 132* 125*  K 3.8 4.0 4.3 4.2 4.2  CL 99* 101 101 97* 91*  CO2 GLUCOSE  111* 113* 123* 134* 116*  BUN <5* <5* <5* <5* <5*  CREATININE 0.53* 0.49* 0.48* 0.59* 0.56*  CALCIUM 8.5* 8.7* 8.6* 8.3* 8.7*  MG 1.6* 1.8  --   --   --    Liver Function Tests:  Recent Labs Lab 08/16/15 0312 08/17/15 0617  AST 20 20  ALT 11* 10*  ALKPHOS  205* 186*  BILITOT 0.7 0.2*  PROT 6.3* 6.2*  ALBUMIN 2.3* 2.2*   No results for input(s): LIPASE, AMYLASE in the last 168 hours. No results for input(s): AMMONIA in the last 168 hours. CBC:  Recent Labs Lab 08/16/15 0312 08/17/15 0617 08/18/15 0832 08/19/15 0445 08/20/15 0200 08/21/15 0508  WBC 7.5 8.6 9.0 13.0* 14.5* 10.2  NEUTROABS 5.4  --   --   --   --  7.3  HGB 8.3* 7.9* 7.7* 9.4* 9.1* 8.7*  HCT 27.0* 24.3* 24.9* 28.1* 27.8* 26.0*  MCV 79.4 78.6 78.3 79.8 78.5 78.5  PLT 564* 538* 565* 526* 499* 467*   Cardiac Enzymes:  Recent Labs Lab 08/19/15 2050 08/20/15 0200 08/20/15 0810  TROPONINI <0.03 <0.03 <0.03   BNP (last 3 results)  Recent Labs  06/11/15 1106 07/02/15 0538  BNP 27.7 34.0    ProBNP (last 3 results) No results for input(s): PROBNP in the last 8760 hours.  CBG:  Recent Labs Lab 08/21/15 1308 08/21/15 1711 08/21/15 2247 08/22/15 0626 08/22/15 1136  GLUCAP 114* 105* 115* 91 85    Recent Results (from the past 240 hour(s))  Culture, routine-abscess     Status: None   Collection Time: 08/13/15 12:16 PM  Result Value Ref Range Status   Specimen Description OTHER  Final   Special Requests LEFT SCAPULA  Final   Gram Stain   Final    RARE WBC NO SQUAMOUS EPITHELIAL CELLS SEEN NO ORGANISMS SEEN Performed at [facility]    Culture   Final    NO GROWTH 2 DAYS Performed at [facility]    Report Status 08/15/2015 FINAL  Final  C difficile quick scan w PCR reflex     Status: None   Collection Time: 08/17/15  1:47 PM  Result Value Ref Range Status   C Diff antigen NEGATIVE NEGATIVE Final   C Diff toxin NEGATIVE NEGATIVE Final   C Diff interpretation Negative for toxigenic C. difficile  Final  Culture, blood (Routine X 2) w Reflex to ID Panel     Status: None (Preliminary result)   Collection Time: 08/20/15 11:18 AM  Result Value Ref Range Status   Specimen Description BLOOD BLOOD RIGHT ARM  Final   Special Requests IN  PEDIATRIC BOTTLE 3CC  Final   Culture NO GROWTH 1 DAY  Final   Report Status PENDING  Incomplete  Culture, blood (Routine X 2) w Reflex to ID Panel     Status: None (Preliminary result)   Collection Time: 08/20/15 11:22 AM  Result Value Ref Range Status   Specimen Description BLOOD BLOOD RIGHT HAND  Final   Special Requests IN PEDIATRIC BOTTLE 3CC  Final   Culture NO GROWTH 1 DAY  Final   Report Status PENDING  Incomplete  Culture, Urine     Status: None   Collection Time: 08/20/15 12:11 PM  Result Value Ref Range Status   Specimen Description URINE, CATHETERIZED  Final   Special Requests NONE  Final   Culture NO GROWTH 1 DAY  Final   Report Status 08/21/2015 FINAL  Final  Studies: No results found.  Scheduled Meds: . sodium chloride   Intravenous Once  . aspirin EC  325 mg Oral Daily  . baclofen  10 mg Oral TID  . docusate sodium  100 mg Oral BID  . feeding supplement (ENSURE ENLIVE)  237 mL Oral TID BM  . gabapentin  300 mg Oral TID  . insulin aspart  0-5 Units Subcutaneous QHS  . insulin aspart  0-9 Units Subcutaneous TID WC  . magnesium oxide  200 mg Oral BID  . morphine  30 mg Oral Q12H  . rifampin  600 mg Oral Daily  . sodium chloride flush  10-40 mL Intracatheter Q12H  . tamsulosin  0.4 mg Oral Daily  . vancomycin  1,500 mg Intravenous Q12H   Continuous Infusions: . sodium chloride 10 mL/hr at 08/19/15 1934    Principal Problem:   Sepsis (HCC) Active Problems:   UTI (lower urinary tract infection)   DM type 2, goal HbA1c < 7% (HCC)   Chronic hyponatremia   Infection   Hip fracture requiring operative repair (HCC)   MRSA bacteremia   Staphylococcus aureus bacteremia with sepsis (HCC)   Osteomyelitis of right hip (HCC)   Hardware complicating wound infection (HCC)   Sepsis due to methicillin resistant Staphylococcus aureus (MRSA) (HCC)   Uncontrolled type 2 diabetes mellitus with complication (HCC)   Acute urinary retention   Low back pain   Muscle  spasms of both lower extremities   Controlled diabetes mellitus type 2 with complications (HCC)   Hypokalemia   Acute pyelonephritis   Abscess   Chest wall abscess   Pleural effusion   Gluteal abscess   Diabetic foot ulcer (HCC)    Time spent: 15 minutes    Crista CurbSULLIVAN,Tamila Gaulin L M.D. [facility] [email or web address], password [email or web address] 08/22/2015, 2:44 PM  LOS: 15 days

## 2015-08-23 ENCOUNTER — Inpatient Hospital Stay (HOSPITAL_COMMUNITY): Payer: Medicare Other

## 2015-08-23 DIAGNOSIS — R509 Fever, unspecified: Secondary | ICD-10-CM

## 2015-08-23 LAB — GLUCOSE, CAPILLARY
GLUCOSE-CAPILLARY: 72 mg/dL (ref 65–99)
GLUCOSE-CAPILLARY: 80 mg/dL (ref 65–99)
GLUCOSE-CAPILLARY: 91 mg/dL (ref 65–99)
Glucose-Capillary: 94 mg/dL (ref 65–99)

## 2015-08-23 LAB — BASIC METABOLIC PANEL
Anion gap: 9 (ref 5–15)
BUN: 15 mg/dL (ref 6–20)
CHLORIDE: 102 mmol/L (ref 101–111)
CO2: 22 mmol/L (ref 22–32)
Calcium: 8.7 mg/dL — ABNORMAL LOW (ref 8.9–10.3)
Creatinine, Ser: 1.04 mg/dL (ref 0.61–1.24)
Glucose, Bld: 91 mg/dL (ref 65–99)
POTASSIUM: 4.6 mmol/L (ref 3.5–5.1)
SODIUM: 133 mmol/L — AB (ref 135–145)

## 2015-08-23 LAB — VANCOMYCIN, TROUGH: Vancomycin Tr: 47 ug/mL (ref 10.0–20.0)

## 2015-08-23 MED ORDER — HYDROMORPHONE 1 MG/ML IV SOLN
INTRAVENOUS | Status: DC
  Administered 2015-08-23: via INTRAVENOUS
  Administered 2015-08-24: 1 mg via INTRAVENOUS
  Administered 2015-08-24: 2 mg via INTRAVENOUS
  Administered 2015-08-24: 3.29 mg via INTRAVENOUS
  Filled 2015-08-23: qty 25

## 2015-08-23 MED ORDER — NALOXONE HCL 0.4 MG/ML IJ SOLN: 0.4000 mg | INTRAMUSCULAR | Status: DC | PRN

## 2015-08-23 MED ORDER — BACLOFEN 10 MG PO TABS: 10.0000 mg | ORAL_TABLET | Freq: Three times a day (TID) | ORAL | Status: DC | PRN

## 2015-08-23 MED ORDER — DIPHENHYDRAMINE HCL 50 MG/ML IJ SOLN: 12.5000 mg | Freq: Four times a day (QID) | INTRAMUSCULAR | Status: DC | PRN

## 2015-08-23 MED ORDER — DIPHENHYDRAMINE HCL 12.5 MG/5ML PO ELIX: 12.5000 mg | ORAL_SOLUTION | Freq: Four times a day (QID) | ORAL | Status: DC | PRN

## 2015-08-23 MED ORDER — SODIUM CHLORIDE 0.9% FLUSH: 9.0000 mL | INTRAVENOUS | Status: DC | PRN

## 2015-08-23 MED ORDER — ONDANSETRON HCL 4 MG/2ML IJ SOLN
4.0000 mg | Freq: Four times a day (QID) | INTRAMUSCULAR | Status: DC | PRN
Start: 2015-08-23 — End: 2015-08-24

## 2015-08-23 NOTE — Clinical Social Work Note (Signed)
CSW continuing to follow patient's progress, plan is for patient to return back to [facility] once he is medically ready for discharge and orders have been received.  Ervin KnackEric R. Joao Mccurdy, MSW, Theresia MajorsLCSWA [phone] 08/23/2015 4:08 PM

## 2015-08-23 NOTE — Progress Notes (Signed)
Physical Therapy Treatment Patient Details Name: Jeff Ferrell MRN: [id number] DOB: 05/04/1951 Today's Date: 08/23/2015    History of Present Illness Pt is a 65 y/o M w/ recent fx of Rt femoral neck w/ repair on 07/21/15 who presents w/ wound infection, now s/p I&D.  On 3/1 pt underwent repeat I&D Rt hip and had a Rt gluteal abscess drained.PT w/ MRSA UTI and tachycardia.  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    PT Comments    Patient with changes in cognition and mobility from previous PT session. Pt limited by severe pain from muscle spasms and not responding to commands or verbally communicating with therapist. Total A +2 for bed mobility. Pt with redness on medial aspect of bilat knees---RN aware. Pt repositioned at end of session.  Follow Up Recommendations  SNF;Supervision/Assistance - 24 hour     Equipment Recommendations  None recommended by PT    Recommendations for Other Services       Precautions / Restrictions Precautions Precautions: Fall Precaution Comments: Wound vac; Bil LE extreme spasms with bil knee flexion maintained throughout, R>L Restrictions Weight Bearing Restrictions: Yes RLE Weight Bearing: Weight bearing as tolerated LLE Weight Bearing: Weight bearing as tolerated    Mobility  Bed Mobility Overal bed mobility: Needs Assistance Bed Mobility: Supine to Sit;Sit to Supine;Rolling Rolling: Total assist;+2 for physical assistance Sidelying to sit: Total assist;+2 for physical assistance   Sit to supine: Total assist;+2 for physical assistance   General bed mobility comments: assist to bring bil LE to EOB and elevate trunk into sitting; pt with severe pain and squeezing therapist arm  Transfers                 General transfer comment: unable to transfer due to severe spasms/pain and pt's lethargy  Ambulation/Gait                 Stairs            Wheelchair Mobility     Modified Rankin (Stroke Patients Only)       Balance     Sitting balance-Leahy Scale: Zero       Standing balance-Leahy Scale: Zero                      Cognition Arousal/Alertness: Lethargic;Suspect due to medications Behavior During Therapy: Flat affect Overall Cognitive Status: Impaired/Different from baseline Area of Impairment: Attention;Following commands;Awareness   Current Attention Level: Sustained   Following Commands:  (not following commands)       General Comments: pt not responding to instructions or questions; pt unable to track and only moans when in pain    Exercises Other Exercises Other Exercises: contract relax of bil LE to promote knee extension    General Comments General comments (skin integrity, edema, etc.): worked on repositioning and stretching bil LE; pt with redness noted on medial aspect of bilat knees where pt tends to lie with knees flexed and pressed together; RN and MD aware      Pertinent Vitals/Pain Pain Assessment: Faces Faces Pain Scale: Hurts whole lot Pain Location: bil LE with movement Pain Descriptors / Indicators: Moaning;Guarding;Grimacing;Spasm Pain Intervention(s): Limited activity within patient's tolerance;Monitored during session;Premedicated before session;Repositioned    Home Living                      Prior Function  PT Goals (current goals can now be found in the care plan section) Acute Rehab PT Goals Patient Stated Goal: none stated PT Goal Formulation: With patient Time For Goal Achievement: 08/27/15 Potential to Achieve Goals: Fair Progress towards PT goals: Not progressing toward goals - comment (limited by pain)    Frequency  Min 3X/week    PT Plan Current plan remains appropriate    Co-evaluation             End of Session Equipment Utilized During Treatment: Gait belt Activity Tolerance: Patient tolerated treatment well;Patient limited by pain Patient  left: with call bell/phone within reach;in chair;with chair alarm set     Time: 6295-2841 PT Time Calculation (min) (ACUTE ONLY): 30 min  Charges:  $Therapeutic Activity: 23-37 mins                    G Codes:      [name], PTA Pager: [phone]   08/23/2015, 2:42 PM

## 2015-08-23 NOTE — Progress Notes (Signed)
[facility] PROGRESS NOTE  Jeff Ferrell WUJ:[id number] DOB: July 14, 1950 DOA: 08/07/2015 PCP: [name], MD  Assessment/Plan:  Sepsis w/ MRSA Pyelonephritis - MRSA bacteremia - infected R hip post fx repair, right gluteal abscess, left scapular phlegmon, Abscess -taken to the OR for I&D 2/19 - care per ID and Ortho - TEE negative for valvular vegetation - 2/20 blood cultures 2/2 negative > PICC placed 08/17/2015 for long term abx 2/24/ S/P IR aspiration scapular area abscess  08/18/2015 patient to the OR for repeat I and D of right periprosthetic hip infection skin,  incision and drainage of right gluteal abscess, and wound vac. fever overnight again. UA neg and repeat cultures negative Have reconsulted ID  Pain Remains difficult to control, but Now very sedated. Will d/c current opiate regimen, and consult palliative for assistance. D/w Dr. [name]. She rec pca for now  DM2 -CBG currently controlled - follow w/ SSI - A1c 5.7 on 2/21  Chronic Hyponatremia Previous workup consistent with Siadh. Improved today  Acute urinary retention  resolved  Anemia of chronic disease, acute illness: S/p 3 units prbc. monitor  Code Status: Full Family Communication: Updated patient. No family at bedside. Disposition Plan: not ready for SNF due to fever and leukocytosis   Consultants:  Orthopedics: Dr. August Saucer 08/07/2015  Infectious diseases: Dr. Ninetta Lights 08/08/2015  Interventional radiology  Procedures: 2/19 - I&D infected R hip wound  2/20 1 units packed red blood cells transfused 2/23 MRI T-spine/L-spine;-Massively distended urinary bladder, causing 2dary Hydronephrosis. -No evidence of acute thoracic or lumbar spinal infection. -Rim enhancing fluid collections in the posterior left chest wall fluid - associated with multiple displaced rib fractures - and in the right gluteal muscle fluid collection are suspicious for phlegmon/abscess - Lt >>Rt pleural effusions with  bilateral lower lobe pneumonia. -Degenerative thoracic spinal stenosis Stable degenerative lumbar spinal stenosis; moderate degenerative cervical spinal stenosis  2/24 TEE EF= 60% to 65%. WMN - negative valvular thrombus - Pericardium, extracardiac: A trivial pericardial effusion  2/24Lt scapula fluid aspiration;3 cc serosanguinous fluid 2/27 - L UE PICC in IR  3/1- 2 units packed red blood cells transfused 3/1-1. 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 Incision and drainage of right gluteal abscess 3. Partial secondary closure of previous surgical incision 4. Application of wound VAC greater than 50 cm------ per Dr.Xu   Antibiotics: Aztreonam 2/18>>>2/19 Vancomycin 2/18 > Ceftriaxone 2/19 >>>>2/20 Rifampin 2/19 >    HPI/Subjective: Unable. Per RN, yells out but wont give hx  Objective: Filed Vitals:   08/23/15 0957 08/23/15 1223  BP: 106/57 96/50  Pulse: 113 110  Temp: 98.9 F (37.2 C) 99.1 F (37.3 C)  Resp: 18 18    Intake/Output Summary (Last 24 hours) at 08/23/15 1400 Last data filed at 08/23/15 1300  Gross per 24 hour  Intake    360 ml  Output   1100 ml  Net   -740 ml   Filed Weights   08/09/15 0255  Weight: 73.1 kg (161 lb 2.5 oz)    Exam:   General:  Groggy. Arousable. Wont answer Qs or follow commands. Yells when moved in bed  Cardiovascular: Regular rate rhythm no murmurs rubs or gallops  Respiratory: Clear to auscultation bilaterally anterior lung fields.  Abdomen: Soft, nontender, nondistended, positive bowel sounds. Wound VAC noted on right hip.  Musculoskeletal: no CCE  Data Reviewed: Basic Metabolic Panel:  Recent Labs Lab 08/17/15 0617 08/18/15 0832 08/19/15  0445 08/21/15 0508 08/23/15 0540  NA 136 136 132* 125* 133*  K 4.0 4.3 4.2 4.2 4.6  CL 101 101 97* 91* 102  CO2 25 25 25 24 22   GLUCOSE 113* 123* 134* 116* 91  BUN <5* <5* <5* <5* 15   CREATININE 0.49* 0.48* 0.59* 0.56* 1.04  CALCIUM 8.7* 8.6* 8.3* 8.7* 8.7*  MG 1.8  --   --   --   --    Liver Function Tests:  Recent Labs Lab 08/17/15 0617  AST 20  ALT 10*  ALKPHOS 186*  BILITOT 0.2*  PROT 6.2*  ALBUMIN 2.2*   No results for input(s): LIPASE, AMYLASE in the last 168 hours. No results for input(s): AMMONIA in the last 168 hours. CBC:  Recent Labs Lab 08/17/15 0617 08/18/15 0832 08/19/15 0445 08/20/15 0200 08/21/15 0508  WBC 8.6 9.0 13.0* 14.5* 10.2  NEUTROABS  --   --   --   --  7.3  HGB 7.9* 7.7* 9.4* 9.1* 8.7*  HCT 24.3* 24.9* 28.1* 27.8* 26.0*  MCV 78.6 78.3 79.8 78.5 78.5  PLT 538* 565* 526* 499* 467*   Cardiac Enzymes:  Recent Labs Lab 08/19/15 2050 08/20/15 0200 08/20/15 0810  TROPONINI <0.03 <0.03 <0.03   BNP (last 3 results)  Recent Labs  06/11/15 1106 07/02/15 0538  BNP 27.7 34.0    ProBNP (last 3 results) No results for input(s): PROBNP in the last 8760 hours.  CBG:  Recent Labs Lab 08/22/15 1136 08/22/15 1621 08/22/15 2257 08/23/15 0621 08/23/15 1220  GLUCAP 85 73 76 72 80    Recent Results (from the past 240 hour(s))  C difficile quick scan w PCR reflex     Status: None   Collection Time: 08/17/15  1:47 PM  Result Value Ref Range Status   C Diff antigen NEGATIVE NEGATIVE Final   C Diff toxin NEGATIVE NEGATIVE Final   C Diff interpretation Negative for toxigenic C. difficile  Final  Culture, blood (Routine X 2) w Reflex to ID Panel     Status: None (Preliminary result)   Collection Time: 08/20/15 11:18 AM  Result Value Ref Range Status   Specimen Description BLOOD BLOOD RIGHT ARM  Final   Special Requests IN PEDIATRIC BOTTLE 3CC  Final   Culture NO GROWTH 2 DAYS  Final   Report Status PENDING  Incomplete  Culture, blood (Routine X 2) w Reflex to ID Panel     Status: None (Preliminary result)   Collection Time: 08/20/15 11:22 AM  Result Value Ref Range Status   Specimen Description BLOOD BLOOD RIGHT HAND   Final   Special Requests IN PEDIATRIC BOTTLE 3CC  Final   Culture NO GROWTH 2 DAYS  Final   Report Status PENDING  Incomplete  Culture, Urine     Status: None   Collection Time: 08/20/15 12:11 PM  Result Value Ref Range Status   Specimen Description URINE, CATHETERIZED  Final   Special Requests NONE  Final   Culture NO GROWTH 1 DAY  Final   Report Status 08/21/2015 FINAL  Final     Studies: No results found.  Scheduled Meds: . sodium chloride   Intravenous Once  . aspirin EC  325 mg Oral Daily  . docusate sodium  100 mg Oral BID  . feeding supplement (ENSURE ENLIVE)  237 mL Oral TID BM  . insulin aspart  0-5 Units Subcutaneous QHS  . insulin aspart  0-9 Units Subcutaneous TID WC  . magnesium oxide  200 mg Oral BID  . rifampin  600 mg Oral Daily  . sodium chloride flush  10-40 mL Intracatheter Q12H  . tamsulosin  0.4 mg Oral Daily   Continuous Infusions: . sodium chloride 10 mL/hr at 08/19/15 1934    Principal Problem:   Fever Active Problems:   UTI (lower urinary tract infection)   DM type 2, goal HbA1c < 7% (HCC)   Chronic hyponatremia   Infection   Hip fracture requiring operative repair (HCC)   MRSA bacteremia   Staphylococcus aureus bacteremia with sepsis (HCC)   Osteomyelitis of right hip (HCC)   Hardware complicating wound infection (HCC)   Uncontrolled type 2 diabetes mellitus with complication (HCC)   Acute urinary retention   Low back pain   Muscle spasms of both lower extremities   Controlled diabetes mellitus type 2 with complications (HCC)   Hypokalemia   Acute pyelonephritis   Chest wall abscess   Pleural effusion   Gluteal abscess   Diabetic foot ulcer (HCC)    Time spent: 15 minutes    Jeff Ferrell L M.D. [facility] [email or web address], password [facility] 08/23/2015, 2:00 PM  LOS: 16 days

## 2015-08-23 NOTE — Consult Note (Signed)
[facility]    Date of Admission:  08/07/2015   Total days of antibiotics 16              Reason for Consult: New fever while on therapy for disseminated MRSA infection    Referring Physician: Dr. Nani Skillern  Principal Problem:   Fever Active Problems:   Staphylococcus aureus bacteremia with sepsis [facility])   Hardware complicating wound infection (HCC)   Chest wall abscess   Gluteal abscess   UTI (lower urinary tract infection)   DM type 2, goal HbA1c < 7% (HCC)   Chronic hyponatremia   Infection   Hip fracture requiring operative repair (HCC)   MRSA bacteremia   Osteomyelitis of right hip (HCC)   Uncontrolled type 2 diabetes mellitus with complication (HCC)   Acute urinary retention   Low back pain   Muscle spasms of both lower extremities   Controlled diabetes mellitus type 2 with complications (HCC)   Hypokalemia   Acute pyelonephritis   Pleural effusion   Diabetic foot ulcer (HCC)   . sodium chloride   Intravenous Once  . aspirin EC  325 mg Oral Daily  . baclofen  10 mg Oral TID  . docusate sodium  100 mg Oral BID  . feeding supplement (ENSURE ENLIVE)  237 mL Oral TID BM  . gabapentin  300 mg Oral TID  . insulin aspart  0-5 Units Subcutaneous QHS  . insulin aspart  0-9 Units Subcutaneous TID WC  . magnesium oxide  200 mg Oral BID  . morphine  60 mg Oral Q12H  . rifampin  600 mg Oral Daily  . sodium chloride flush  10-40 mL Intracatheter Q12H  . tamsulosin  0.4 mg Oral Daily  . vancomycin  1,500 mg Intravenous Q12H    Recommendations: 1. Continue vancomycin and rifampin 2. Repeat blood cultures today 3. Portable chest x-ray (if he can cooperate)   Assessment: The cause of his recent new fevers is unclear. Negative blood cultures suggest his MRSA infection is responding to surgery and antibiotics. He is not having any diarrhea. He has no rash. Urine culture is negative. I will repeat blood cultures today and also  attempt to get a chest x-ray. I will follow-up tomorrow.    HPI: Jeff Ferrell is a 65 y.o. male with recent right hip fracture and underwent open reduction and internal fixation. Postoperatively he developed MRSA bacteremia complicated by right hip infection, right gluteal abscess and left posterior chest wall abscess. He had no evidence of endocarditis on TEE and repeat blood cultures were negative. The chest wall abscess was aspirated and cultures were negative. He underwent drainage of the gluteal abscess and to surgical procedures on his right hip. He has now received a little over 2 weeks of vancomycin and rifampin. He began having new fever several days ago. Repeat blood cultures and urine cultures on 08/20/2015 were negative. Last night he spiked another temperature to 102.3.  Review of Systems: Review of Systems  Unable to perform ROS: mental acuity  Gastrointestinal:       His nurse's aide states that he has not had any diarrhea recently.    Past Medical History  Diagnosis Date  . Diabetes mellitus     regulated by diet  . Chronic back pain   . 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 Diabetic osteomyelitis (HCC) 03/29/2015  . Diabetic foot ulcer (HCC) 03/29/2015    Social  History  Substance Use Topics  . Smoking status: Never Smoker   . Smokeless tobacco: None  . Alcohol Use: 3.6 oz/week    6 Cans of beer per week     Comment: daily    History reviewed. No pertinent family history. Allergies  Allergen Reactions  . Penicillins Hives and Rash    Has patient had a PCN reaction causing immediate rash, facial/tongue/throat swelling, SOB or lightheadedness with hypotension: Yes Has patient had a PCN reaction causing severe rash involving mucus membranes or skin necrosis: Yes   Has patient had a PCN reaction that required hospitalization No Has patient had a PCN reaction occurring within the last 10 years: No If all of the above answers are "NO", then may proceed  with Cephalosporin use.     OBJECTIVE: Blood pressure 96/50, pulse 110, temperature 99.1 F (37.3 C), temperature source Oral, resp. rate 18, height 5\' 9"  (1.753 m), weight 161 lb 2.5 oz (73.1 kg), SpO2 100 %.  Physical Exam  Constitutional:  He is in bed laying on his left side holding onto the bed rail. He periodically moans out in pain. He will not open his eyes or attempt to answer any questions.  Cardiovascular: Normal rate and regular rhythm.   No murmur heard. Pulmonary/Chest:  His lungs sounded clear but the examination was limited by his moaning and calling out in pain.  Abdominal: Soft. There is no tenderness.  Musculoskeletal:  VAC dressing in place over right hip wound.  Skin: No rash noted.  IV site looks okay.    Lab Results Lab Results  Component Value Date   WBC 10.2 08/21/2015   HGB 8.7* 08/21/2015   HCT 26.0* 08/21/2015   MCV 78.5 08/21/2015   PLT 467* 08/21/2015    Lab Results  Component Value Date   CREATININE 1.04 08/23/2015   BUN 15 08/23/2015   NA 133* 08/23/2015   K 4.6 08/23/2015   CL 102 08/23/2015   CO2 22 08/23/2015    Lab Results  Component Value Date   ALT 10* 08/17/2015   AST 20 08/17/2015   ALKPHOS 186* 08/17/2015   BILITOT 0.2* 08/17/2015     Microbiology: Recent Results (from the past 240 hour(s))  C difficile quick scan w PCR reflex     Status: None   Collection Time: 08/17/15  1:47 PM  Result Value Ref Range Status   C Diff antigen NEGATIVE NEGATIVE Final   C Diff toxin NEGATIVE NEGATIVE Final   C Diff interpretation Negative for toxigenic C. difficile  Final  Culture, blood (Routine X 2) w Reflex to ID Panel     Status: None (Preliminary result)   Collection Time: 08/20/15 11:18 AM  Result Value Ref Range Status   Specimen Description BLOOD BLOOD RIGHT ARM  Final   Special Requests IN PEDIATRIC BOTTLE 3CC  Final   Culture NO GROWTH 2 DAYS  Final   Report Status PENDING  Incomplete  Culture, blood (Routine X 2) w  Reflex to ID Panel     Status: None (Preliminary result)   Collection Time: 08/20/15 11:22 AM  Result Value Ref Range Status   Specimen Description BLOOD BLOOD RIGHT HAND  Final   Special Requests IN PEDIATRIC BOTTLE 3CC  Final   Culture NO GROWTH 2 DAYS  Final   Report Status PENDING  Incomplete  Culture, Urine     Status: None   Collection Time: 08/20/15 12:11 PM  Result Value Ref Range Status   Specimen  Description URINE, CATHETERIZED  Final   Special Requests NONE  Final   Culture NO GROWTH 1 DAY  Final   Report Status 08/21/2015 FINAL  Final    Cliffton Asters, MD [facility] [phone] pager   [phone] cell 08/23/2015, 12:36 PM

## 2015-08-23 NOTE — Progress Notes (Signed)
Pt very lethargic arousable waiting for pca pump to arrive

## 2015-08-23 NOTE — Progress Notes (Signed)
Pt has new pressure ulcer stage 1 to in between lateral knees. Unblancheable when touched, red and intact. Pink foam dsg applied to bilateral inner knees. Dionne BucyP. Amo Morgin Halls RN

## 2015-08-23 NOTE — Progress Notes (Signed)
Pharmacy Antibiotic Note  Jeff Ferrell is a 65 y.o. male admitted on 08/07/2015 with MRSA bacteremia, R hip osteo, and multiple abscesses concerning for infection.  Now day #16 of abx for MRSA bacteremia w/ R hip osteo w/ hardware (s/p I&D 2/19; hip was repaired on 2/1). TEE neg vegetations. Per ID: continue IV vancomycin and rifampin x 8 weeks postop (thru 09/18/15) then will need 10 months plus of oral suppressive abx. Has had repeated I&D's with last one being on 3/1.  Creat 0.56>1.04,UOP 0.4 ml/kg/hr;  T max 102.3.  Vanc trough drawn before dose hung is elevated at 47 mcg/ml - was 18 last week on same dose.  Vancomycin is accumulating.  Vanc 1500 mg dose given after VT drawn.  Plan: DC scheduled vancomycin doses due to elevated trough/accumulation Check vanc random Wednesday morning Continue rifampin 600mg  PO daily Monitor clinical picture, renal function, VT weekly  F/U C&S, abx deescalation / LOT  Height: 5\' 9"  (175.3 cm) Weight: 161 lb 2.5 oz (73.1 kg) IBW/kg (Calculated) : 70.7  Temp (24hrs), Avg:99.6 F (37.6 C), Min:98.4 F (36.9 C), Max:102.3 F (39.1 C)   Recent Labs Lab 08/17/15 0617 08/17/15 0952 08/18/15 0832 08/19/15 0445 08/20/15 0200 08/21/15 0508 08/23/15 0540 08/23/15 1055  WBC 8.6  --  9.0 13.0* 14.5* 10.2  --   --   CREATININE 0.49*  --  0.48* 0.59*  --  0.56* 1.04  --   VANCOTROUGH  --  18  --   --   --   --   --  47*    Estimated Creatinine Clearance: 69.9 mL/min (by C-G formula based on Cr of 1.04).    Allergies  Allergen Reactions  . Penicillins Hives and Rash    Has patient had a PCN reaction causing immediate rash, facial/tongue/throat swelling, SOB or lightheadedness with hypotension: Yes Has patient had a PCN reaction causing severe rash involving mucus membranes or skin necrosis: Yes   Has patient had a PCN reaction that required hospitalization No Has patient had a PCN reaction occurring within the last 10 years: No If all of the above  answers are "NO", then may proceed with Cephalosporin use.     Antimicrobials this admission: Azactam 2/18 >>2/19 Rocephin 2/19 >> 2/20 Vancomycin 2/18 >> Rifampin PO 2/19 >>  Dose adjustments this admission: 2/21: VT = 7 on 1gm q12h, dose increased to 1gm q8h 2/22: VT = 14 on 1gm q8h, dose increased to 1250mg  q8h 2/25: VT = 25 (1hr early) on 1250 q8h, dose changed to 1500mg  q12h 2/28: VT = 18 (on time) on 1500 mg q12h 3/6: VT = 47 1500 mg dose given after level drawn  Microbiology results: 2/18 blood x 2 - MRSA (S: Clinda, Rifampin, TCN, Vanc, MIC = 1, R: Septra, CIpro) 2/18 R leg wound - abundant MRSA 2/19 R leg wound - MRSA 2/19 urine -100k/ml MRSA 2/19 blood x 2 - 1/2 MRSA 2/19 R leg wound, anaerobic x 2 - no anaerobes isolated 2/19 MRSA PCR- POS -> CHG/Bactroban 2/19-20>>2/24 2/20 Blood x 2 - negative F 2/24 left scapula abscess - negative 2/28 c diff neg 3/3 BC x2 >>ngtd 3/6 BCx2.Herby Abraham.  Bernadett Milian T. Barbara Ahart, Pharm.D. [phone] 08/23/2015 1:33 PM

## 2015-08-24 ENCOUNTER — Inpatient Hospital Stay (HOSPITAL_COMMUNITY): Payer: Medicare Other

## 2015-08-24 DIAGNOSIS — Z789 Other specified health status: Secondary | ICD-10-CM

## 2015-08-24 LAB — COMPREHENSIVE METABOLIC PANEL
ALBUMIN: 2.2 g/dL — AB (ref 3.5–5.0)
ALK PHOS: 172 U/L — AB (ref 38–126)
ALT: 17 U/L (ref 17–63)
ANION GAP: 7 (ref 5–15)
AST: 44 U/L — AB (ref 15–41)
BILIRUBIN TOTAL: 1 mg/dL (ref 0.3–1.2)
BUN: 17 mg/dL (ref 6–20)
CALCIUM: 8.7 mg/dL — AB (ref 8.9–10.3)
CO2: 23 mmol/L (ref 22–32)
CREATININE: 1.18 mg/dL (ref 0.61–1.24)
Chloride: 105 mmol/L (ref 101–111)
Glucose, Bld: 127 mg/dL — ABNORMAL HIGH (ref 65–99)
Potassium: 4.8 mmol/L (ref 3.5–5.1)
Sodium: 135 mmol/L (ref 135–145)
TOTAL PROTEIN: 6.7 g/dL (ref 6.5–8.1)

## 2015-08-24 LAB — CBC WITH DIFFERENTIAL/PLATELET
BASOS ABS: 0 10*3/uL (ref 0.0–0.1)
BASOS PCT: 0 %
EOS ABS: 0.1 10*3/uL (ref 0.0–0.7)
Eosinophils Relative: 1 %
HEMATOCRIT: 26.2 % — AB (ref 39.0–52.0)
HEMOGLOBIN: 8.5 g/dL — AB (ref 13.0–17.0)
Lymphocytes Relative: 10 %
Lymphs Abs: 1.2 10*3/uL (ref 0.7–4.0)
MCH: 26 pg (ref 26.0–34.0)
MCHC: 32.4 g/dL (ref 30.0–36.0)
MCV: 80.1 fL (ref 78.0–100.0)
Monocytes Absolute: 0.9 10*3/uL (ref 0.1–1.0)
Monocytes Relative: 7 %
NEUTROS ABS: 10 10*3/uL — AB (ref 1.7–7.7)
NEUTROS PCT: 82 %
Platelets: 374 10*3/uL (ref 150–400)
RBC: 3.27 MIL/uL — AB (ref 4.22–5.81)
RDW: 15 % (ref 11.5–15.5)
WBC: 12.1 10*3/uL — AB (ref 4.0–10.5)

## 2015-08-24 LAB — AMMONIA: AMMONIA: 30 umol/L (ref 9–35)

## 2015-08-24 LAB — GLUCOSE, CAPILLARY
GLUCOSE-CAPILLARY: 113 mg/dL — AB (ref 65–99)
GLUCOSE-CAPILLARY: 110 mg/dL — AB (ref 65–99)
GLUCOSE-CAPILLARY: 110 mg/dL — AB (ref 65–99)
Glucose-Capillary: 114 mg/dL — ABNORMAL HIGH (ref 65–99)

## 2015-08-24 MED ORDER — SODIUM CHLORIDE 0.9 % IV SOLN
75.0000 ug/h | INTRAVENOUS | Status: DC
  Administered 2015-08-24 – 2015-08-25 (×4): 75 ug/h via INTRAVENOUS
  Filled 2015-08-24 (×2): qty 50

## 2015-08-24 MED ORDER — FENTANYL BOLUS VIA INFUSION
75.0000 ug | INTRAVENOUS | Status: DC | PRN
  Administered 2015-08-25 – 2015-08-27 (×12): 75 ug via INTRAVENOUS
  Filled 2015-08-24 (×2): qty 75

## 2015-08-24 MED ORDER — DIAZEPAM 5 MG/ML IJ SOLN
5.0000 mg | INTRAMUSCULAR | Status: DC | PRN
  Administered 2015-08-24 – 2015-09-02 (×16): 5 mg via INTRAVENOUS
  Filled 2015-08-24 (×17): qty 2

## 2015-08-24 MED FILL — Phenylephrine-NaCl Pref Syr 0.4 MG/10ML-0.9% (40 MCG/ML): INTRAVENOUS | Qty: 10 | Status: AC

## 2015-08-24 NOTE — Progress Notes (Signed)
[facility] PROGRESS NOTE  Guinevere FerrariHarold Calvey ZOX:[id number] DOB: 01/21/1951 DOA: 08/07/2015 PCP: Daisy FloroWYMER,ANTOINETTE, MD  Assessment/Plan:  Sepsis w/ MRSA Pyelonephritis - MRSA bacteremia - infected R hip post fx repair, right gluteal abscess, left scapular phlegmon, Abscess -taken to the OR for I&D 2/19 - care per ID and Ortho - TEE negative for valvular vegetation - 2/20 blood cultures 2/2 negative > PICC placed 08/17/2015 for long term abx 2/24/ S/P IR aspiration scapular area abscess  08/18/2015 patient to the OR for repeat I and D of right periprosthetic hip infection skin,  incision and drainage of right gluteal abscess, and wound vac. fever overnight again. UA neg and repeat cultures negative. ID following. Fevers trending down. On vancomycin and rifampin.   Pain  difficult to control, then oversedated 3/6. now on dilaudid PCA and all other sedating meds/opiates/muscle relaxants stopped. Remains lethargic. Will ct brain. Palliative consulted to assist  DM2 -CBG currently controlled - follow w/ SSI - A1c 5.7 on 2/21  Chronic Hyponatremia Previous workup consistent with Siadh. Improved  Acute urinary retention  resolved  Anemia of chronic disease, acute illness: S/p 3 units prbc. Monitor  Somnolence likely medication related. See above. Will also check NH3  Code Status: Full Family Communication:  Disposition Plan: not ready for SNF due to fever and leukocytosis   Consultants:  Orthopedics: Dr. August Saucerean 08/07/2015  Infectious diseases: Dr. Ninetta LightsHatcher 08/08/2015  Interventional radiology  palliative  Procedures: 2/19 - I&D infected R hip wound  2/20 1 units packed red blood cells transfused 2/23 MRI T-spine/L-spine;-Massively distended urinary bladder, causing 2dary Hydronephrosis. -No evidence of acute thoracic or lumbar spinal infection. -Rim enhancing fluid collections in the posterior left chest wall fluid - associated with multiple displaced rib fractures - and in  the right gluteal muscle fluid collection are suspicious for phlegmon/abscess - Lt >>Rt pleural effusions with bilateral lower lobe pneumonia. -Degenerative thoracic spinal stenosis Stable degenerative lumbar spinal stenosis; moderate degenerative cervical spinal stenosis  2/24 TEE EF= 60% to 65%. WMN - negative valvular thrombus - Pericardium, extracardiac: A trivial pericardial effusion  2/24Lt scapula fluid aspiration;3 cc serosanguinous fluid 2/27 - L UE PICC in IR  3/1- 2 units packed red blood cells transfused 3/1-1. 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 Incision and drainage of right gluteal abscess 3. Partial secondary closure of previous surgical incision 4. Application of wound VAC greater than 50 cm------ per Dr.Xu   Antibiotics: Aztreonam 2/18>>>2/19 Vancomycin 2/18 > Ceftriaxone 2/19 >>>>2/20 Rifampin 2/19 >    HPI/Subjective: Unable. Per RN, remains somnolent.  Objective: Filed Vitals:   08/24/15 0400 08/24/15 0826  BP:    Pulse:    Temp:    Resp: 18 16    Intake/Output Summary (Last 24 hours) at 08/24/15 1110 Last data filed at 08/24/15 0650  Gross per 24 hour  Intake    480 ml  Output      0 ml  Net    480 ml   Filed Weights   08/09/15 0255  Weight: 73.1 kg (161 lb 2.5 oz)    Exam:   General:  Groggy. Arousable. Wont answer Qs or follow commands.  Cardiovascular: Regular rate rhythm no murmurs rubs or gallops  Respiratory: Clear to auscultation bilaterally anterior lung fields.  Abdomen: Soft, nontender, nondistended, positive bowel sounds. Wound VAC noted on right hip.  Musculoskeletal: no CCE. Wound vac.   Data Reviewed: Basic Metabolic Panel:  Recent  Labs Lab 08/18/15 0832 08/19/15 0445 08/21/15 0508 08/23/15 0540  NA 136 132* 125* 133*  K 4.3 4.2 4.2 4.6  CL 101 97* 91* 102  CO2 GLUCOSE 123* 134* 116* 91  BUN <5* <5* <5* 15   CREATININE 0.48* 0.59* 0.56* 1.04  CALCIUM 8.6* 8.3* 8.7* 8.7*   Liver Function Tests: No results for input(s): AST, ALT, ALKPHOS, BILITOT, PROT, ALBUMIN in the last 168 hours. No results for input(s): LIPASE, AMYLASE in the last 168 hours. No results for input(s): AMMONIA in the last 168 hours. CBC:  Recent Labs Lab 08/18/15 0832 08/19/15 0445 08/20/15 0200 08/21/15 0508  WBC 9.0 13.0* 14.5* 10.2  NEUTROABS  --   --   --  7.3  HGB 7.7* 9.4* 9.1* 8.7*  HCT 24.9* 28.1* 27.8* 26.0*  MCV 78.3 79.8 78.5 78.5  PLT 565* 526* 499* 467*   Cardiac Enzymes:  Recent Labs Lab 08/19/15 2050 08/20/15 0200 08/20/15 0810  TROPONINI <0.03 <0.03 <0.03   BNP (last 3 results)  Recent Labs  06/11/15 1106 07/02/15 0538  BNP 27.7 34.0    ProBNP (last 3 results) No results for input(s): PROBNP in the last 8760 hours.  CBG:  Recent Labs Lab 08/23/15 0621 08/23/15 1220 08/23/15 1701 08/23/15 2311 08/24/15 0621  GLUCAP 72 80 91 94 113*    Recent Results (from the past 240 hour(s))  C difficile quick scan w PCR reflex     Status: None   Collection Time: 08/17/15  1:47 PM  Result Value Ref Range Status   C Diff antigen NEGATIVE NEGATIVE Final   C Diff toxin NEGATIVE NEGATIVE Final   C Diff interpretation Negative for toxigenic C. difficile  Final  Culture, blood (Routine X 2) w Reflex to ID Panel     Status: None (Preliminary result)   Collection Time: 08/20/15 11:18 AM  Result Value Ref Range Status   Specimen Description BLOOD BLOOD RIGHT ARM  Final   Special Requests IN PEDIATRIC BOTTLE 3CC  Final   Culture NO GROWTH 3 DAYS  Final   Report Status PENDING  Incomplete  Culture, blood (Routine X 2) w Reflex to ID Panel     Status: None (Preliminary result)   Collection Time: 08/20/15 11:22 AM  Result Value Ref Range Status   Specimen Description BLOOD BLOOD RIGHT HAND  Final   Special Requests IN PEDIATRIC BOTTLE 3CC  Final   Culture NO GROWTH 3 DAYS  Final   Report  Status PENDING  Incomplete  Culture, Urine     Status: None   Collection Time: 08/20/15 12:11 PM  Result Value Ref Range Status   Specimen Description URINE, CATHETERIZED  Final   Special Requests NONE  Final   Culture NO GROWTH 1 DAY  Final   Report Status 08/21/2015 FINAL  Final     Studies: Dg Chest Port 1 View  08/23/2015  CLINICAL DATA:  Fever. EXAM: PORTABLE CHEST 1 VIEW COMPARISON:  08/15/2015 FINDINGS: Shallow inspiration. Atelectasis in the lung bases. Normal heart size and pulmonary vascularity. No focal consolidation in the lungs. No blunting of costophrenic angles. No pneumothorax. Nodular opacity projected over the left upper lung measuring 1.2 cm. This has been present previously appears represent old left rib fracture. Additional old left rib fractures also identified. Degenerative changes in the shoulders. Left PICC catheter with tip over the low SVC. IMPRESSION: Shallow inspiration with atelectasis in the lung bases. Electronically Signed   By: Chrissie Noa  Andria Meuse M.D.   On: 08/23/2015 22:35    Scheduled Meds: . sodium chloride   Intravenous Once  . aspirin EC  325 mg Oral Daily  . docusate sodium  100 mg Oral BID  . feeding supplement (ENSURE ENLIVE)  237 mL Oral TID BM  . HYDROmorphone   Intravenous 6 times per day  . insulin aspart  0-5 Units Subcutaneous QHS  . insulin aspart  0-9 Units Subcutaneous TID WC  . magnesium oxide  200 mg Oral BID  . rifampin  600 mg Oral Daily  . sodium chloride flush  10-40 mL Intracatheter Q12H  . tamsulosin  0.4 mg Oral Daily   Continuous Infusions: . sodium chloride 10 mL/hr at 08/19/15 1934    Principal Problem:   Fever Active Problems:   UTI (lower urinary tract infection)   DM type 2, goal HbA1c < 7% (HCC)   Chronic hyponatremia   Infection   Hip fracture requiring operative repair (HCC)   MRSA bacteremia   Staphylococcus aureus bacteremia with sepsis (HCC)   Osteomyelitis of right hip (HCC)   Hardware complicating wound  infection (HCC)   Uncontrolled type 2 diabetes mellitus with complication (HCC)   Acute urinary retention   Low back pain   Muscle spasms of both lower extremities   Controlled diabetes mellitus type 2 with complications (HCC)   Hypokalemia   Acute pyelonephritis   Chest wall abscess   Pleural effusion   Gluteal abscess   Diabetic foot ulcer (HCC)    Time spent: 25 minutes    Christiane Ha M.D. [facility] [email or web address], password [facility] 08/24/2015, 11:10 AM  LOS: 17 days

## 2015-08-24 NOTE — Progress Notes (Signed)
Patient ID: Jeff Ferrell, male   DOB: 1950/10/29, 65 y.o.   MRN: [id number]         [facility]    Date of Admission:  08/07/2015   Total days of antibiotics 17         Principal Problem:   Fever Active Problems:   Staphylococcus aureus bacteremia with sepsis (HCC)   Hardware complicating wound infection (HCC)   Chest wall abscess   Gluteal abscess   UTI (lower urinary tract infection)   DM type 2, goal HbA1c < 7% (HCC)   Chronic hyponatremia   Infection   Hip fracture requiring operative repair (HCC)   MRSA bacteremia   Osteomyelitis of right hip (HCC)   Uncontrolled type 2 diabetes mellitus with complication (HCC)   Acute urinary retention   Low back pain   Muscle spasms of both lower extremities   Controlled diabetes mellitus type 2 with complications (HCC)   Hypokalemia   Acute pyelonephritis   Pleural effusion   Diabetic foot ulcer (HCC)   . sodium chloride   Intravenous Once  . aspirin EC  325 mg Oral Daily  . docusate sodium  100 mg Oral BID  . feeding supplement (ENSURE ENLIVE)  237 mL Oral TID BM  . HYDROmorphone   Intravenous 6 times per day  . insulin aspart  0-5 Units Subcutaneous QHS  . insulin aspart  0-9 Units Subcutaneous TID WC  . magnesium oxide  200 mg Oral BID  . rifampin  600 mg Oral Daily  . sodium chloride flush  10-40 mL Intracatheter Q12H  . tamsulosin  0.4 mg Oral Daily    SUBJECTIVE: He Denies having any pain at this time.  Review of Systems: Review of Systems  Unable to perform ROS: mental acuity    Past Medical History  Diagnosis Date  . Diabetes mellitus     regulated by diet  . Chronic back pain   . 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 Diabetic osteomyelitis (HCC) 03/29/2015  . Diabetic foot ulcer (HCC) 03/29/2015    Social History  Substance Use Topics  . Smoking status: Never Smoker   . Smokeless tobacco: None  . Alcohol Use: 3.6 oz/week    6 Cans of beer per week     Comment:  daily    History reviewed. No pertinent family history. Allergies  Allergen Reactions  . Penicillins Hives and Rash    Has patient had a PCN reaction causing immediate rash, facial/tongue/throat swelling, SOB or lightheadedness with hypotension: Yes Has patient had a PCN reaction causing severe rash involving mucus membranes or skin necrosis: Yes   Has patient had a PCN reaction that required hospitalization No Has patient had a PCN reaction occurring within the last 10 years: No If all of the above answers are "NO", then may proceed with Cephalosporin use.     OBJECTIVE: Filed Vitals:   08/23/15 2009 08/24/15 0100 08/24/15 0400 08/24/15 0826  BP: 115/62 119/60    Pulse: 111 105    Temp: 99.2 F (37.3 C) 99.8 F (37.7 C)    TempSrc: Axillary Axillary    Resp: Height:      Weight:      SpO2: 96% 98% 30% 93%   Body mass index is 23.79 kg/(m^2).  Physical Exam  Constitutional: He is oriented to person, place, and time.  He is more alert today. He could tell me that he was not having any  pain but would not answer other questions.  Eyes: Conjunctivae are normal.  Neck: Neck supple.  Cardiovascular: Normal rate and regular rhythm.   No murmur heard. Pulmonary/Chest: Breath sounds normal.  Abdominal: Soft. There is no tenderness.  Musculoskeletal:  He has a VAC dressing on his right hip wound.  Neurological: He is alert and oriented to person, place, and time.  Skin: No rash noted.    Lab Results Lab Results  Component Value Date   WBC 10.2 08/21/2015   HGB 8.7* 08/21/2015   HCT 26.0* 08/21/2015   MCV 78.5 08/21/2015   PLT 467* 08/21/2015    Lab Results  Component Value Date   CREATININE 1.04 08/23/2015   BUN 15 08/23/2015   NA 133* 08/23/2015   K 4.6 08/23/2015   CL 102 08/23/2015   CO2 22 08/23/2015    Lab Results  Component Value Date   ALT 10* 08/17/2015   AST 20 08/17/2015   ALKPHOS 186* 08/17/2015   BILITOT 0.2* 08/17/2015   SED RATE  (mm/hr)  Date Value  08/07/2015 100*  07/01/2015 40*   CRP (mg/dL)  Date Value  69/62/952802/18/2017 22.8*  07/01/2015 3.4*    Vancomycin trough 08/23/2015: Elevated at 47  Microbiology: Recent Results (from the past 240 hour(s))  C difficile quick scan w PCR reflex     Status: None   Collection Time: 08/17/15  1:47 PM  Result Value Ref Range Status   C Diff antigen NEGATIVE NEGATIVE Final   C Diff toxin NEGATIVE NEGATIVE Final   C Diff interpretation Negative for toxigenic C. difficile  Final  Culture, blood (Routine X 2) w Reflex to ID Panel     Status: None (Preliminary result)   Collection Time: 08/20/15 11:18 AM  Result Value Ref Range Status   Specimen Description BLOOD BLOOD RIGHT ARM  Final   Special Requests IN PEDIATRIC BOTTLE 3CC  Final   Culture NO GROWTH 3 DAYS  Final   Report Status PENDING  Incomplete  Culture, blood (Routine X 2) w Reflex to ID Panel     Status: None (Preliminary result)   Collection Time: 08/20/15 11:22 AM  Result Value Ref Range Status   Specimen Description BLOOD BLOOD RIGHT HAND  Final   Special Requests IN PEDIATRIC BOTTLE 3CC  Final   Culture NO GROWTH 3 DAYS  Final   Report Status PENDING  Incomplete  Culture, Urine     Status: None   Collection Time: 08/20/15 12:11 PM  Result Value Ref Range Status   Specimen Description URINE, CATHETERIZED  Final   Special Requests NONE  Final   Culture NO GROWTH 1 DAY  Final   Report Status 08/21/2015 FINAL  Final     ASSESSMENT: So far, the cause of his recent fevers remains unclear. All recent blood and urine cultures have been negative and there is no evidence of pneumonia by chest x-ray or exam. I see no evidence of drug allergy.  PLAN: 1. Monitor random vancomycin levels before re-dosing 2. Continue rifampin   Cliffton AstersJohn [name], MD [facility] [phone] pager   [phone] cell 08/24/2015, 9:17 AM

## 2015-08-24 NOTE — Progress Notes (Signed)
Palliative consult for pain management received and case discussed with Dr. Lendell CapriceSullivan. Recommended starting a hydromorphone PCA, discontinuing all other opiates and sedating/pain medications for now. Will determine what his 24 hour IV total pain requirements are and use that information to determine an effective and simplified oral or transdermal regimen of pain medication. Issues have largely been over sedation- PCA should help correct for this-will also review dosing patterns- next step will be to transition to a basal dose and breakthrough dose using PCA before oral switch- anticipate another 24-48 hours to make this transition. Will see him on 3/7 AM for full consultation and at that time will review his pump.  Anderson MaltaElizabeth Golding, DO Palliative Medicine [phone]

## 2015-08-24 NOTE — Consult Note (Addendum)
Consultation Note Date: 08/24/2015   Patient Name: Jeff FerrariHarold Ferrell  DOB: 06/23/1950  MRN: [id number]  Age / Sex: 65 y.o., male  PCP: Daisy FloroAntoinette Wymer, MD Referring Physician: Christiane Haorinna L Sullivan, MD  Reason for Consultation: Pain control  Clinical Assessment/Narrative: [name] is a 65 yo man with 5 admissions in the last 6 months and is currently admitted with disseminated MRSA infection. Palliative consultation was obtained for pain management in this high risk, medically complex gentleman.  03/2015: Acute Osteomyelitis Great Toe, amputation 05/2015: Left Hip Fracture-Spontaneous, unprovoked? 06/23/2015: Re-fracture Left Hip at Trochanter, unprovoked? 07/02/2015: Readmitted with intractable left hip pain and non-weight bearing-unable to care for himself at home and refused SNF on prior discharge.Signs of infection noted at incision site on discharge. 07/19/2015: RIGHT hip fracture, unprovoked? 08/07/2015: Sepsis, Right Hip Incision abscess  Comprehensive review of his medical records was done and currently Mr. Wamble is have such severe pain that he cannot have a conversation, he unable to respond to even basic questions and is grasping the side rails of his bed lying in the fetal position on his side. Even minimal physical touch or movement evoked a dramatic pain response on my exam. He is disheveled, poorly groomed, not cooperative and appears to be suffering intensely. He continues to have fevers, he is on vancomycin and rifampin for MRSA Wounds, urine cx positive.  After review of his medical records and presentation I am MOST concerned about the high likely hood that these are pathological fractures or related to occult malignancy, 2 possibly unprovoked left hip fractures in different areas of the bone, one right hip fracture and he also appears to have multiple displaced rib fractures on CT of his chest dated  2/24/29017 and seen on CXR. I also suspect occult Alcoholism ETOH abuse due to social history suggesting 6 beers per day self reported, no clear or reported trauma with his rib fractures and hip fractures, refusal to go to SNF, and his labs show malnutrition with hypomagnesemia, hyponatremia, elevated AlkP. He also has issues with severe urinary retension requiring a Foley catheter to be placed which may be related to Lumbar nerve root compression. I am unsure of the home situation and will get in touch with family ASAP for more information.   Last imaging 08/12/2015 MRI T-Spine, L-Spine: 1. Massively distended urinary bladder, causing secondary hydronephrosis. Recommend bladder decompression with Foley catheter. 2. No evidence of acute thoracic or lumbar spinal infection. Suboptimal MRI visualization of the lumbar spine today. 3. Rim enhancing fluid collections in the posterior left chest wall fluid - associated with multiple displaced rib fractures - and in the right gluteal muscle fluid collection are suspicious for phlegmon/abscess in this setting. 4. Left greater than right pleural effusions with bilateral lower lobe pneumonia. 5. Degenerative thoracic spinal stenosis from T3-T4 to T9-T10. Stable degenerative lumbar spinal stenosis at L1-L2. At least moderate degenerative cervical spinal stenosis at C2-C3 and C3-C4.  [name] has multiple reasons for severe pain but in general they should be improving vs. getting worse-does not seem to be responding to increased dosing of opiates - his mental status has deteriorated and he is unable to use the PCA button. He may have opiate induced hyperalgesia, which would be rare -favor new/additional pathologic fractures and lumbar spine involvement now.  SUMMARY OF RECOMMENDATIONS  1. Intractable Pain, not responsive to escalating doses of opiates  Possible Opiate Induced Hyperalgesia: will transition him to Fentanyl basal infusion with PRN bolus  dosing for uncontrolled pain, could be  having some neurotoxicity from standard opiates.  Consider additional imaging due to severity of his acute pain crisis and mental status changes- last imaging was sub-optimal for lumbar- needs rib fractures evaluated as continued source of pain and non-union issues. CT T-Spine, L-Spine, Abdomen, Pelvis and Chest. Will call radiology and discuss additional imaging options given clinical deterioration.  May benefit from the addition of a bisphosphonate for pain control- will order zometa or pamindronate  Consider additional occult malignancy evaluation- myeloma, etc..  Will add on Lyrica for neuropathic pain features after fentanyl infusion started  Depending on CT L-Spine findings there may be interventional pain options.  He has history massive urinary retention- will do a bladder scan his UOP is way down today- foley was removed    Code Status/Advance Care Planning: Full code - this will need to be addressed in the context of his current level of suffering and decline since admission, he currently does not have capacity for his own decision making which appears to be a marked change from when he was admitted. Will contact [facility] and set up time to discuss goals of care and prognosis.  Other Directives:None  _____________________________________________________________________________________________ Chief Complaint/ Primary Diagnoses: Present on Admission:  . Chronic hyponatremia . UTI (lower urinary tract infection) . Infection . Hip fracture requiring operative repair (HCC) . MRSA bacteremia . Staphylococcus aureus bacteremia with sepsis (HCC) . Osteomyelitis of right hip (HCC) . Hardware complicating wound infection (HCC) . Uncontrolled type 2 diabetes mellitus with complication (HCC) . Acute urinary retention . Low back pain . Muscle spasms of both lower extremities . Controlled diabetes mellitus type 2 with complications (HCC) .  Hypokalemia . Acute pyelonephritis . Chest wall abscess . Pleural effusion . Gluteal abscess  I have reviewed the medical record, interviewed the patient and family, and examined the patient. The following aspects are pertinent.  Past Medical History  Diagnosis Date  . Diabetes mellitus     regulated by diet  . Chronic back pain   . 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 Diabetic osteomyelitis (HCC) 03/29/2015  . Diabetic foot ulcer (HCC) 03/29/2015   Social History   Social History  . Marital Status: Single    Spouse Name: N/A  . Number of Children: N/A  . Years of Education: N/A   Social History Main Topics  . Smoking status: Never Smoker   . Smokeless tobacco: None  . Alcohol Use: 3.6 oz/week    6 Cans of beer per week     Comment: daily  . Drug Use: No  . Sexual Activity: Not Asked   Other Topics Concern  . None   Social History Narrative   History reviewed. No pertinent family history. Scheduled Meds: . sodium chloride   Intravenous Once  . aspirin EC  325 mg Oral Daily  . docusate sodium  100 mg Oral BID  . feeding supplement (ENSURE ENLIVE)  237 mL Oral TID BM  . insulin aspart  0-5 Units Subcutaneous QHS  . insulin aspart  0-9 Units Subcutaneous TID WC  . rifampin  600 mg Oral Daily  . sodium chloride flush  10-40 mL Intracatheter Q12H  . tamsulosin  0.4 mg Oral Daily   Continuous Infusions: . sodium chloride 75 mL/hr at 08/24/15 1334  . fentaNYL infusion INTRAVENOUS 75 mcg/hr (08/24/15 1606)   PRN Meds:.acetaminophen **OR** acetaminophen, alum & mag hydroxide-simeth, diazepam, fentaNYL, naLOXone (NARCAN)  injection, ondansetron **OR** ondansetron (ZOFRAN) IV, sodium chloride flush Medications Prior to Admission:  Prior to  Admission medications   Medication Sig Start Date End Date Taking? Authorizing Provider  acetaminophen (TYLENOL) 325 MG tablet Take 2 tablets (650 mg total) by mouth every 6 (six) hours as needed for mild pain (or Fever >/=  101). 07/06/15  Yes Osvaldo Shipper, MD  docusate sodium (COLACE) 100 MG capsule Take 1 capsule (100 mg total) by mouth 2 (two) times daily. 07/06/15  Yes Osvaldo Shipper, MD  enoxaparin (LOVENOX) 40 MG/0.4ML injection Inject 0.4 mLs (40 mg total) into the skin daily. 07/21/15  Yes Tarry Kos, MD  gabapentin (NEURONTIN) 100 MG capsule Take 100 mg by mouth 3 (three) times daily.   Yes Historical Provider, MD  magnesium hydroxide (MILK OF MAGNESIA) 400 MG/5ML suspension Take 30 mLs by mouth daily as needed for mild constipation.   Yes Historical Provider, MD  methocarbamol (ROBAXIN) 500 MG tablet Take 1 tablet (500 mg total) by mouth 4 (four) times daily. 07/28/15  Yes Rhetta Mura, MD  oxyCODONE (OXY IR/ROXICODONE) 5 MG immediate release tablet Take 1-3 tablets (5-15 mg total) by mouth every 4 (four) hours as needed. Patient taking differently: Take 5-15 mg by mouth every 4 (four) hours as needed (mild to moderate to severe pain.). 5 mg=mild pain 10 mg=moderate pain 15=severe pain. 07/06/15  Yes Osvaldo Shipper, MD  oxyCODONE-acetaminophen (PERCOCET) 5-325 MG tablet Take 1-2 tablets by mouth every 4 (four) hours as needed for severe pain. 07/28/15  Yes Rhetta Mura, MD  sennosides-docusate sodium (SENOKOT-S) 8.6-50 MG tablet Take 1 tablet by mouth 2 (two) times daily.   Yes Historical Provider, MD  sitaGLIPtin (JANUVIA) 50 MG tablet Take 1 tablet (50 mg total) by mouth daily. 06/23/15  Yes Catarina Hartshorn, MD  tamsulosin (FLOMAX) 0.4 MG CAPS capsule Take 1 capsule (0.4 mg total) by mouth daily. 06/21/15  Yes Catarina Hartshorn, MD   Allergies  Allergen Reactions  . Penicillins Hives and Rash    Has patient had a PCN reaction causing immediate rash, facial/tongue/throat swelling, SOB or lightheadedness with hypotension: Yes Has patient had a PCN reaction causing severe rash involving mucus membranes or skin necrosis: Yes   Has patient had a PCN reaction that required hospitalization No Has patient had a PCN  reaction occurring within the last 10 years: No If all of the above answers are "NO", then may proceed with Cephalosporin use.     Review of Systems  Physical Exam  Vital Signs: BP 94/40 mmHg  Pulse 108  Temp(Src) 98.4 F (36.9 C) (Axillary)  Resp 16  Ht  (1.753 m)  Wt 73.1 kg (161 lb 2.5 oz)  BMI 23.79 kg/m2  SpO2 95%  SpO2: SpO2: 95 % O2 Device:SpO2: 95 % O2 Flow Rate: .O2 Flow Rate (L/min): 2 L/min  IO: Intake/output summary:  Intake/Output Summary (Last 24 hours) at 08/24/15 2326 Last data filed at 08/24/15 1900  Gross per 24 hour  Intake    240 ml  Output    225 ml  Net     15 ml    LBM: Last BM Date: 08/20/15 Baseline Weight: Weight: 73.1 kg (161 lb 2.5 oz) Most recent weight: Weight: 73.1 kg (161 lb 2.5 oz)      Palliative Assessment/Data:  Flowsheet Rows        Most Recent Value   Intake Tab    Referral Department  Hospitalist   Unit at Time of Referral  Orthopedic Unit   Palliative Care Primary Diagnosis  Pain   Date Notified  08/23/15  Palliative Care Type  New Palliative care   Reason for referral  Pain   Date of Admission  08/07/15   # of days IP prior to Palliative referral  16   Clinical Assessment    Psychosocial & Spiritual Assessment    Palliative Care Outcomes       Additional Data Reviewed:  CBC:    Component Value Date/Time   WBC 12.1* 08/24/2015 1215   HGB 8.5* 08/24/2015 1215   HCT 26.2* 08/24/2015 1215   PLT 374 08/24/2015 1215   MCV 80.1 08/24/2015 1215   NEUTROABS 10.0* 08/24/2015 1215   LYMPHSABS 1.2 08/24/2015 1215   MONOABS 0.9 08/24/2015 1215   EOSABS 0.1 08/24/2015 1215   BASOSABS 0.0 08/24/2015 1215   Comprehensive Metabolic Panel:    Component Value Date/Time   NA 135 08/24/2015 1215   K 4.8 08/24/2015 1215   CL 105 08/24/2015 1215   CO2 23 08/24/2015 1215   BUN 17 08/24/2015 1215   CREATININE 1.18 08/24/2015 1215   GLUCOSE 127* 08/24/2015 1215   CALCIUM 8.7* 08/24/2015 1215   AST 44* 08/24/2015  1215   ALT 17 08/24/2015 1215   ALKPHOS 172* 08/24/2015 1215   BILITOT 1.0 08/24/2015 1215   PROT 6.7 08/24/2015 1215   ALBUMIN 2.2* 08/24/2015 1215     Time In: 10:15 Time Out: 11:25 Time Total: 70 minutes Greater than 50%  of this time was spent counseling and coordinating care related to the above assessment and plan.  Signed by: Hilbert Odor, DO  08/24/2015, 11:26 PM  Please contact Palliative Medicine Team phone at [phone] for questions and concerns.

## 2015-08-25 LAB — CULTURE, BLOOD (ROUTINE X 2)
Culture: NO GROWTH
Culture: NO GROWTH

## 2015-08-25 LAB — GLUCOSE, CAPILLARY
GLUCOSE-CAPILLARY: 102 mg/dL — AB (ref 65–99)
GLUCOSE-CAPILLARY: 122 mg/dL — AB (ref 65–99)
GLUCOSE-CAPILLARY: 115 mg/dL — AB (ref 65–99)
Glucose-Capillary: 113 mg/dL — ABNORMAL HIGH (ref 65–99)

## 2015-08-25 LAB — BASIC METABOLIC PANEL
Anion gap: 7 (ref 5–15)
BUN: 20 mg/dL (ref 6–20)
CHLORIDE: 108 mmol/L (ref 101–111)
CO2: 22 mmol/L (ref 22–32)
CREATININE: 1.18 mg/dL (ref 0.61–1.24)
Calcium: 8.9 mg/dL (ref 8.9–10.3)
GFR calc Af Amer: >60 mL/min (ref >60)
GFR calc non Af Amer: >60 mL/min (ref >60)
Glucose, Bld: 124 mg/dL — ABNORMAL HIGH (ref 65–99)
Potassium: 4.3 mmol/L (ref 3.5–5.1)
SODIUM: 137 mmol/L (ref 135–145)

## 2015-08-25 LAB — VANCOMYCIN, RANDOM: VANCOMYCIN RM: 13 ug/mL

## 2015-08-25 MED ORDER — PRO-STAT SUGAR FREE PO LIQD
30.0000 mL | Freq: Two times a day (BID) | ORAL | Status: DC
  Administered 2015-08-25 – 2015-09-03 (×5): 30 mL via ORAL
  Filled 2015-08-25 (×10): qty 30

## 2015-08-25 MED ORDER — VANCOMYCIN HCL IN DEXTROSE 1-5 GM/200ML-% IV SOLN
1000.0000 mg | INTRAVENOUS | Status: DC
  Administered 2015-08-25 – 2015-08-28 (×4): 1000 mg via INTRAVENOUS
  Filled 2015-08-25 (×4): qty 200

## 2015-08-25 MED ORDER — BISACODYL 5 MG PO TBEC
5.0000 mg | DELAYED_RELEASE_TABLET | Freq: Every day | ORAL | Status: DC
  Filled 2015-08-25: qty 1

## 2015-08-25 MED ORDER — VANCOMYCIN HCL IN DEXTROSE 750-5 MG/150ML-% IV SOLN
750.0000 mg | Freq: Two times a day (BID) | INTRAVENOUS | Status: DC
  Filled 2015-08-25 (×2): qty 150

## 2015-08-25 NOTE — Progress Notes (Signed)
Patient continues to be nonverbal except when you try to move him and then he starts with some verbal profanity. Patient continues to call out in pain/spasms. This RN tried to straighten out his cords and the patient grabbed this RN's wrist and would not let go. Patient is very agitated and does not want to eat or drink. A foley was placed due to urinary retention. 1600 ml came out once the foley was placed. Patient seems to be biting the inside of his mouth as there was what looked like to be tissue in with orange color sputum. Not sure if this is the cause because the patient refuses to open his mouth for this RN to check. Patient continues to grab the rail and lay in a fetal position. Patient does not want to cooperate and his body is rigid when you try to turn him or reposition him. Patient has received multiple fentanyl boluses as well as valium. This does not seem to help the patient as he still continues to moan and yell out. Will continue to monitor.

## 2015-08-25 NOTE — Progress Notes (Signed)
Pharmacy Antibiotic Note  Jeff FerrariHarold Ferrell is a 65 y.o. male admitted on 08/07/2015 with MRSA bacteremia, R hip osteo, and multiple abscesses concerning for infection.  Now day #18 of abx for MRSA bacteremia w/ R hip osteo w/ hardware (s/p I&D 2/19; hip was repaired on 2/1). TEE neg vegetations. Per ID: continue IV vancomycin and rifampin x 8 weeks postop (thru 09/18/15) then will need 10 months plus of oral suppressive abx. Has had repeated I&D's with last one being on 3/1.  Vanc random this morning was 13, will restart dosing based on current estimated renal function. Will plan for trough at new ss and continue to monitor SCr.  Plan: Start vancomycin 750 mg IV q 12 hours and check trough at new ss Continue rifampin 600mg  PO daily Monitor clinical picture, renal function, VT weekly  F/U C&S, abx deescalation / LOT  Height: 5\' 9"  (175.3 cm) Weight: 161 lb 2.5 oz (73.1 kg) IBW/kg (Calculated) : 70.7  Temp (24hrs), Avg:99.1 F (37.3 C), Min:98.4 F (36.9 C), Max:99.6 F (37.6 C)   Recent Labs Lab 08/19/15 0445 08/20/15 0200 08/21/15 0508 08/23/15 0540 08/23/15 1055 08/24/15 1215 08/25/15 0500 08/25/15 0520  WBC 13.0* 14.5* 10.2  --   --  12.1*  --   --   CREATININE 0.59*  --  0.56* 1.04  --  1.18 1.18  --   VANCOTROUGH  --   --   --   --  5047*  --   --   --   VANCORANDOM  --   --   --   --   --   --   --  13    Estimated Creatinine Clearance: 61.6 mL/min (by C-G formula based on Cr of 1.18).    Allergies  Allergen Reactions  . Penicillins Hives and Rash    Has patient had a PCN reaction causing immediate rash, facial/tongue/throat swelling, SOB or lightheadedness with hypotension: Yes Has patient had a PCN reaction causing severe rash involving mucus membranes or skin necrosis: Yes   Has patient had a PCN reaction that required hospitalization No Has patient had a PCN reaction occurring within the last 10 years: No If all of the above answers are "NO", then may proceed with  Cephalosporin use.     Antimicrobials this admission: Azactam 2/18 >>2/19 Rocephin 2/19 >> 2/20 Vancomycin 2/18 >> Rifampin PO 2/19 >>  Dose adjustments this admission: 2/21: VT = 7 on 1gm q12h, dose increased to 1gm q8h 2/22: VT = 14 on 1gm q8h, dose increased to 1250mg  q8h 2/25: VT = 25 (1hr early) on 1250 q8h, dose changed to 1500mg  q12h 2/28: VT = 18 (on time) on 1500 mg q12h 3/6: VT = 47 1500 mg dose given after level drawn 3/8: VR = 13 (with no dose x ~41 hours)   Microbiology results: 2/18 blood x 2 - MRSA (S: Clinda, Rifampin, TCN, Vanc, MIC = 1, R: Septra, CIpro) 2/18 R leg wound - abundant MRSA 2/19 R leg wound - MRSA 2/19 urine -100k/ml MRSA 2/19 blood x 2 - 1/2 MRSA 2/19 R leg wound, anaerobic x 2 - no anaerobes isolated 2/19 MRSA PCR- POS -> CHG/Bactroban 2/19-20>>2/24 2/20 Blood x 2 - negative F 2/24 left scapula abscess - negative 2/28 c diff neg 3/3 BC x2 >>ngtd 3/6 BCx2>>ngtd   Thank you for allowing us to participate in this patients care. Signe Coltonya C Avid Guillette, PharmD Pager: [phone]  08/25/2015 8:38 AM

## 2015-08-25 NOTE — Progress Notes (Signed)
Pharmacy Antibiotic Note  Jeff Ferrell is a 65 y.o. male admitted on 08/07/2015 with MRSA bacteremia, R hip osteo, and multiple abscesses concerning for infection.  Now day #18 of abx for MRSA bacteremia w/ R hip osteo w/ hardware (s/p I&D 2/19; hip was repaired on 2/1). TEE neg vegetations.   Per ID: continue IV vancomycin and rifampin x 8 weeks postop (thru 09/18/15) then will need 10 months plus of oral suppressive abx. Has had repeated I&D's with last one being on 3/1.   Vanc trough on 3/6 was 47, and pt received a 1500 mg dose following trough. Further doses were then held. This morning his vancomycin random level was 13. After factoring in the extra dose and estimating the true trough to be ~60, the patient specific kinetics were calculated and a new regimen of 1000 mg q 24 hours was deemed to be more appropriate.   Of note, the patient's renal function has significantly worsened while on vanc. 4 days ago his SCr was 0.56, and has now over doubled to 1.18. The pt also had an acute episode of urinary retention which has now resolved with placement of a foley. He has 2.2 L urine output so far today.  After calculation of kinetics, will restart 1000 mg q24h with plan to check a trough at C[facility]. If the patient's renal function does not improve over the next few days, consideration should be given to discontinuation of vancomycin and initiation of an alternative antibiotic with MRSA coverage.  Plan: - Start vancomycin 1000 mg IV q24 hours and check trough at new Css - Continue rifampin 600mg  PO daily - Monitor clinical picture, renal function, VT weekly when stable - F/U C&S, abx deescalation / LOT  Height: 5\' 9"  (175.3 cm) Weight: 161 lb 2.5 oz (73.1 kg) IBW/kg (Calculated) : 70.7  Temp (24hrs), Avg:99.1 F (37.3 C), Min:98.4 F (36.9 C), Max:99.6 F (37.6 C)   Recent Labs Lab 08/19/15 0445 08/20/15 0200 08/21/15 0508 08/23/15 0540 08/23/15 1055 08/24/15 1215 08/25/15 0500  08/25/15 0520  WBC 13.0* 14.5* 10.2  --   --  12.1*  --   --   CREATININE 0.59*  --  0.56* 1.04  --  1.18 1.18  --   VANCOTROUGH  --   --   --   --  6847*  --   --   --   VANCORANDOM  --   --   --   --   --   --   --  13    Estimated Creatinine Clearance: 61.6 mL/min (by C-G formula based on Cr of 1.18).    Allergies  Allergen Reactions  . Penicillins Hives and Rash    Has patient had a PCN reaction causing immediate rash, facial/tongue/throat swelling, SOB or lightheadedness with hypotension: Yes Has patient had a PCN reaction causing severe rash involving mucus membranes or skin necrosis: Yes   Has patient had a PCN reaction that required hospitalization No Has patient had a PCN reaction occurring within the last 10 years: No If all of the above answers are "NO", then may proceed with Cephalosporin use.     Antimicrobials this admission: Azactam 2/18 >>2/19 Rocephin 2/19 >> 2/20 Vancomycin 2/18 >> Rifampin PO 2/19 >>  Dose adjustments this admission: 2/21: VT = 7 on 1gm q12h, dose increased to 1gm q8h 2/22: VT = 14 on 1gm q8h, dose increased to 1250mg  q8h 2/25: VT = 25 (1hr early) on 1250 q8h, dose changed to 1500mg  q12h  2/28: VT = 18 (on time) on 1500 mg q12h 3/6: VT = 47 1500 mg dose given after level drawn 3/8: VR = 13 (with no dose x ~41 hours)   Microbiology results: 2/18 blood x 2 - MRSA (S: Clinda, Rifampin, TCN, Vanc, MIC = 1, R: Septra, CIpro) 2/18 R leg wound - abundant MRSA 2/19 R leg wound - MRSA 2/19 urine -100k/ml MRSA 2/19 blood x 2 - 1/2 MRSA 2/19 R leg wound, anaerobic x 2 - no anaerobes isolated 2/19 MRSA PCR- POS -> CHG/Bactroban 2/19-20>>2/24 2/20 Blood x 2 - negative F 2/24 left scapula abscess - negative 2/28 c diff neg 3/3 BC x2 >>ngtd 3/6 BCx2>>ngtd   Thank you for allowing [place] to participate in this patients care.  Arcola Jansky, PharmD Clinical Pharmacy Resident Pager: [phone]  08/25/2015 11:32 AM

## 2015-08-25 NOTE — Progress Notes (Signed)
Physical Therapy Treatment Patient Details Name: Jeff FerrariHarold Mohamud MRN: [id number] DOB: 03/24/1951 Today's Date: 08/25/2015    History of Present Illness Pt is a 65 y/o M w/ recent fx of Rt femoral neck w/ repair on 07/21/15 who presents w/ wound infection, now s/p I&D.  On 3/1 pt underwent repeat I&D Rt hip and had a Rt gluteal abscess drained.PT w/ MRSA UTI and tachycardia.  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    PT Comments    Patient able to have conversation with therapist but disoriented X 3. Pt not responding to all questions but improved communication form previous session. Pt continues to be limited by pain and declined mobility. Pt educated on benefits of being OOB and sitting up. Pt's bilat LE repositioned. Continue to progress as tolerated.   Follow Up Recommendations  SNF;Supervision/Assistance - 24 hour     Equipment Recommendations  None recommended by PT    Recommendations for Other Services       Precautions / Restrictions Precautions Precautions: Fall Precaution Comments: Wound vac; Bil LE extreme spasms with bil knee flexion maintained throughout, R>L Restrictions Weight Bearing Restrictions: Yes RLE Weight Bearing: Weight bearing as tolerated LLE Weight Bearing: Weight bearing as tolerated    Mobility  Bed Mobility               General bed mobility comments: pt declined any mobiltiy this session due to pain; bilat LE repositioned with total assist  Transfers                    Ambulation/Gait                 Stairs            Wheelchair Mobility    Modified Rankin (Stroke Patients Only)       Balance     Sitting balance-Leahy Scale: Zero       Standing balance-Leahy Scale: Zero                      Cognition Arousal/Alertness: Awake/alert Behavior During Therapy: Flat affect Overall Cognitive Status: Impaired/Different from baseline Area of  Impairment: Problem solving;Orientation;Safety/judgement Orientation Level: Disoriented to;Place;Time;Situation Current Attention Level: Sustained     Safety/Judgement: Decreased awareness of safety     General Comments: pt able to have conversation but not consistently respoonding to questions and not responding to instruction; pt did not recall what a wound vac is or that he pulled it out today    Exercises      General Comments General comments (skin integrity, edema, etc.): pt educated on benefits of being OOB and sitting upright      Pertinent Vitals/Pain Pain Assessment: Faces Faces Pain Scale: Hurts worst Pain Location: bilat LE/headache Pain Descriptors / Indicators: Grimacing;Guarding;Moaning;Spasm ("hurts") Pain Intervention(s): Limited activity within patient's tolerance;Monitored during session;Premedicated before session;Repositioned;Relaxation    Home Living                      Prior Function            PT Goals (current goals can now be found in the care plan section) Acute Rehab PT Goals Patient Stated Goal: none stated PT Goal Formulation: With patient Time For Goal Achievement: 08/27/15 Potential to Achieve Goals: Fair Progress towards PT goals: Not progressing toward goals - comment (limited by pain)    Frequency  Min 3X/week  PT Plan Current plan remains appropriate    Co-evaluation             End of Session Equipment Utilized During Treatment: Gait belt Activity Tolerance: Patient limited by pain Patient left: with call bell/phone within reach;in bed;with bed alarm set;with restraints reapplied     Time: 1140-1156 PT Time Calculation (min) (ACUTE ONLY): 16 min  Charges:  $Therapeutic Activity: 8-22 mins                    G Codes:      [name], PTA Pager: [phone]   08/25/2015, 1:13 PM

## 2015-08-25 NOTE — Progress Notes (Signed)
Initial Nutrition Assessment  DOCUMENTATION CODES:   Not applicable  INTERVENTION:  Continue Ensure Enlive po TID, each supplement provides 350 kcal and 20 grams of protein.  Provide 30 ml Prostat po BID, each supplement provides 100 kcal and 15 grams of protein.   If pt continues to have poor po intake, may need consideration of enteral nutrition.  Recommend SLP swallow evaluation as pt reports swallowing difficulties.  RD to continue to monitor.   NUTRITION DIAGNOSIS:   Inadequate oral intake related to dysphagia as evidenced by meal completion < 25%.  GOAL:   Patient will meet greater than or equal to 90% of their needs  MONITOR:   PO intake, Supplement acceptance, Weight trends, Labs, I & O's, Skin  REASON FOR ASSESSMENT:   Low Braden    ASSESSMENT:   53M with DM type 2 here with a surgical right hip infection s/p hip reair on 2/1 and MRSA bacteremia.  He presented 2/18 with drainage from the incision site and was found to have MRSA bacteremia.  PROCEDURE (3/1): 1. 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 Incision and drainage of right gluteal abscess 3. Partial secondary closure of previous surgical incision 4. Application of wound VAC greater than 50 cm  Pt reports swallowing difficulties which has been ongoing over the past couple of days. He reports he is unable to eat anything. Recommend SLP swallow evaluation to assess. Additionally recommend consideration of enteral nutrition is po intake continues to be poor. Pt reports eating well PTA with consumption of at least 3 meals a day. Per Epic weight records, pt with a 12.5% weight loss in 3 months. Ensure has been ordered TID with varied intake. RD to additionally order Prostat to aid in protein needs, if pt is able to consume them pending swallowing. RD attempted to perform nutrition focused physical exam, however once RD lifted up the bed  covers, pt started to moan and yell out in pain, thus unable to observe body. RD to continue to monitor.   Labs and medications reviewed.   Diet Order:  Diet Carb Modified Fluid consistency:: Thin; Room service appropriate?: Yes  Skin:  Wound (see comment) (DTI on R knee, incision on R hip with wound VAC)  Last BM:  3/3  Height:   Ht Readings from Last 1 Encounters:  08/09/15 5\' 9"  (1.753 m)    Weight:   Wt Readings from Last 1 Encounters:  08/09/15 161 lb 2.5 oz (73.1 kg)    Ideal Body Weight:  72.7 kg  BMI:  Body mass index is 23.79 kg/(m^2).  Estimated Nutritional Needs:   Kcal:  2100-2300  Protein:  100-110 grams  Fluid:  2.1 - 2.3L/day  EDUCATION NEEDS:   No education needs identified at this time  Roslyn SmilingStephanie Shigeru Lampert, MS, RD, LDN Pager # [phone] After hours/ weekend pager # [phone]

## 2015-08-25 NOTE — Progress Notes (Signed)
Patient ID: Jeff Ferrell, male   DOB: 10/17/1950, 65 y.o.   MRN: [id number]         [facility]    Date of Admission:  08/07/2015   Total days of antibiotics 18  He has been afebrile recently and repeat cultures are negative. I will continue vancomycin and rifampin. However, his overall status has deteriorated and his prognosis doesn't seem to be quite poor. I appreciate Dr. Lamar BlinksGolding's assistance.         Jeff AstersJohn Jazz Biddy, MD [facility] [phone] pager   [phone] cell 06/22/2015, 1:32 PM

## 2015-08-25 NOTE — Progress Notes (Signed)
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 At that time, also noted to have a urinary tract infection. Patient seen by orthopedic surgery and underwent irrigation and debridement of hip on 2/19. Cultures as well as blood cultures positive for MRSA. Infectious disease consulted. Patient has been on vancomycin since. Cardiology consulted for TEE which was negative as were repeat blood cultures.    On 2/24, patient underwent aspiration of left chest wall. Plan was for long-term IV antibiotics with PICC line placed on 2/28. 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 He started spiking fevers on 3/3 and infectious disease reconsulted.  Other complications during hospitalization include severe urinary retention requiring Foley catheter, blood transfusions on 2/20 and 3/1 and most significantly, severe pain that has been extremely difficult to control or oversedation.  Palliative care consulted on 3/7.  Today, patient more somnolent. Gets agitated at times and confused  Assessment/Plan: Principal Problem:   Sepsis with MRSA polynephritis, infected right hip post fracture repair with secondary right gluteal abscess, left scapular phlegmon and left chest wall. Status post I&D 2. Currently on vancomycin and rifampin. Appreciate infectious disease help. Now afebrile 48 hours. Repeat blood cultures have been negative. On wound VAC. Active  Problems:   UTI (lower urinary tract infection): Felt to have been treated, given antibiotics.    DM type 2, goal HbA1c < 7% (HCC) poor by mouth intake. Blood sugars stable. On supplemental shakes. 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 Now normalized. Stable.    Acute urinary retention: Now resolved. Passed voiding trial.  Code Status: Full code   Family Communication: left message with sister   Disposition Plan: Continue inpatient, likely will be here for several more days. May end up with palliative care disposition   Consultants:  Infectious Disease   IR  Orthopedics  Palliative Care  Cardiology  Procedures: 2/19 - I&D infected R hip wound  2/20 1 units packed red blood cells transfused 2/24 TEE EF= 60% to 65%. 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 with excisional debridement of the skin, muscle, bone 25 x 5 cm w/ incision and drainage of right gluteal abscess & partial secondary closure of previous surgical incision  Antibiotics: Aztreonam 2/18-2/19 Vancomycin 2/18-present Ceftriaxone 2/19-2/20 Rifampin 2/19- present   Objective: BP 94/51 mmHg  Pulse 103  Temp(Src) 97.7 F (36.5 C) (Oral)  Resp 16  Ht  (1.753 m)  Wt 73.1 kg (161 lb 2.5 oz)  BMI 23.79 kg/m2  SpO2 98%  Intake/Output Summary (Last 24 hours) at 08/25/15 1526 Last data filed at 08/25/15 1300  Gross per 24 hour  Intake      0 ml  Output   2375 ml  Net  -2375 ml  Filed Weights   08/09/15 0255  Weight: 73.1 kg (161 lb 2.5 oz)    Exam:   General:  Somnolent, no acute distress  Cardiovascular: Regular rate and rhythm, borderline tachycardia   Respiratory: Reports between effort, otherwise clear    Abdomen:  Soft, non-tender?, Minimal distention, hypoactive bowel sounds    Musculoskeletal:    emaciated   Data Reviewed: Basic Metabolic Panel:  Recent Labs Lab 08/19/15 0445 08/21/15 0508 08/23/15 0540 08/24/15 1215 08/25/15 0500  NA 132* 125* 133* 135 137  K 4.2 4.2 4.6 4.8 4.3  CL 97* 91* 102 105 108  CO2 25 24 22 23 22   GLUCOSE 134* 116* 91 127* 124*  BUN <5* <5* 15 17 20   CREATININE 0.59* 0.56* 1.04 1.18 1.18  CALCIUM 8.3* 8.7* 8.7* 8.7* 8.9   Liver Function Tests:  Recent Labs Lab 08/24/15 1215  AST 44*  ALT 17  ALKPHOS 172*  BILITOT 1.0  PROT 6.7  ALBUMIN 2.2*   No results for input(s): LIPASE, AMYLASE in the last 168 hours.  Recent Labs Lab 08/24/15 1456  AMMONIA 30   CBC:  Recent Labs Lab 08/19/15 0445 08/20/15 0200 08/21/15 0508 08/24/15 1215  WBC 13.0* 14.5* 10.2 12.1*  NEUTROABS  --   --  7.3 10.0*  HGB 9.4* 9.1* 8.7* 8.5*  HCT 28.1* 27.8* 26.0* 26.2*  MCV 79.8 78.5 78.5 80.1  PLT 526* 499* 467* 374   Cardiac Enzymes:    Recent Labs Lab 08/19/15 2050 08/20/15 0200 08/20/15 0810  TROPONINI <0.03 <0.03 <0.03   BNP (last 3 results)  Recent Labs  06/11/15 1106 07/02/15 0538  BNP 27.7 34.0    ProBNP (last 3 results) No results for input(s): PROBNP in the last 8760 hours.  CBG:  Recent Labs Lab 08/24/15 1158 08/24/15 1611 08/24/15 2146 08/25/15 0656 08/25/15 1111  GLUCAP 110* 110* 114* 113* 102*    Recent Results (from the past 240 hour(s))  C difficile quick scan w PCR reflex     Status: None   Collection Time: 08/17/15  1:47 PM  Result Value Ref Range Status   C Diff antigen NEGATIVE NEGATIVE Final   C Diff toxin NEGATIVE NEGATIVE Final   C Diff interpretation Negative for toxigenic C. difficile  Final  Culture, blood (Routine X 2) w Reflex to ID Panel     Status: None   Collection Time: 08/20/15 11:18 AM  Result Value Ref Range Status   Specimen Description BLOOD BLOOD RIGHT ARM  Final   Special Requests IN  PEDIATRIC BOTTLE 3CC  Final   Culture NO GROWTH 5 DAYS  Final   Report Status 08/25/2015 FINAL  Final  Culture, blood (Routine X 2) w Reflex to ID Panel     Status: None   Collection Time: 08/20/15 11:22 AM  Result Value Ref Range Status   Specimen Description BLOOD BLOOD RIGHT HAND  Final   Special Requests IN PEDIATRIC BOTTLE 3CC  Final   Culture NO GROWTH 5 DAYS  Final   Report Status 08/25/2015 FINAL  Final  Culture, Urine     Status: None   Collection Time: 08/20/15 12:11 PM  Result Value Ref Range Status   Specimen Description URINE, CATHETERIZED  Final   Special Requests NONE  Final   Culture NO GROWTH 1 DAY  Final   Report Status 08/21/2015 FINAL  Final  Culture, blood (routine x 2)     Status: None (Preliminary result)   Collection Time: 08/23/15  3:59  PM  Result Value Ref Range Status   Specimen Description BLOOD RIGHT HAND  Final   Special Requests IN PEDIATRIC BOTTLE 3CC  Final   Culture NO GROWTH 2 DAYS  Final   Report Status PENDING  Incomplete  Culture, blood (routine x 2)     Status: None (Preliminary result)   Collection Time: 08/23/15  3:59 PM  Result Value Ref Range Status   Specimen Description BLOOD RIGHT ARM  Final   Special Requests IN PEDIATRIC BOTTLE 3CC  Final   Culture NO GROWTH 2 DAYS  Final   Report Status PENDING  Incomplete     Studies: No results found.  Scheduled Meds: . sodium chloride   Intravenous Once  . aspirin EC  325 mg Oral Daily  . bisacodyl  5 mg Oral Daily  . docusate sodium  100 mg Oral BID  . feeding supplement (ENSURE ENLIVE)  237 mL Oral TID BM  . feeding supplement (PRO-STAT SUGAR FREE 64)  30 mL Oral BID  . insulin aspart  0-5 Units Subcutaneous QHS  . insulin aspart  0-9 Units Subcutaneous TID WC  . rifampin  600 mg Oral Daily  . sodium chloride flush  10-40 mL Intracatheter Q12H  . tamsulosin  0.4 mg Oral Daily  . vancomycin  1,000 mg Intravenous Q24H    Continuous Infusions: . sodium chloride 75 mL/hr at 08/25/15  1327  . fentaNYL infusion INTRAVENOUS 75 mcg/hr (08/25/15 1334)     Time spent: 25 minutes  Hollice Espy  [facility] Pager [phone] . If 7PM-7AM, please contact night-coverage at [email or web address], password [facility] 08/25/2015, 3:26 PM  LOS: 18 days

## 2015-08-26 DIAGNOSIS — F1010 Alcohol abuse, uncomplicated: Secondary | ICD-10-CM

## 2015-08-26 DIAGNOSIS — R208 Other disturbances of skin sensation: Secondary | ICD-10-CM

## 2015-08-26 DIAGNOSIS — T402X5A Adverse effect of other opioids, initial encounter: Secondary | ICD-10-CM

## 2015-08-26 DIAGNOSIS — G9340 Encephalopathy, unspecified: Secondary | ICD-10-CM

## 2015-08-26 DIAGNOSIS — R52 Pain, unspecified: Secondary | ICD-10-CM | POA: Diagnosis not present

## 2015-08-26 DIAGNOSIS — K5900 Constipation, unspecified: Secondary | ICD-10-CM | POA: Diagnosis present

## 2015-08-26 LAB — CBC
HEMATOCRIT: 24.3 % — AB (ref 39.0–52.0)
HEMOGLOBIN: 7.9 g/dL — AB (ref 13.0–17.0)
MCH: 26.1 pg (ref 26.0–34.0)
MCHC: 32.5 g/dL (ref 30.0–36.0)
MCV: 80.2 fL (ref 78.0–100.0)
Platelets: 370 10*3/uL (ref 150–400)
RBC: 3.03 MIL/uL — ABNORMAL LOW (ref 4.22–5.81)
RDW: 15.2 % (ref 11.5–15.5)
WBC: 10.3 10*3/uL (ref 4.0–10.5)

## 2015-08-26 LAB — GLUCOSE, CAPILLARY
GLUCOSE-CAPILLARY: 123 mg/dL — AB (ref 65–99)
Glucose-Capillary: 92 mg/dL (ref 65–99)

## 2015-08-26 MED ORDER — FLEET ENEMA 7-19 GM/118ML RE ENEM
1.0000 | ENEMA | Freq: Once | RECTAL | Status: DC
  Filled 2015-08-26: qty 1

## 2015-08-26 MED ORDER — BISACODYL 5 MG PO TBEC
10.0000 mg | DELAYED_RELEASE_TABLET | Freq: Every day | ORAL | Status: DC
  Administered 2015-08-28 – 2015-09-03 (×4): 10 mg via ORAL
  Filled 2015-08-26 (×5): qty 2

## 2015-08-26 MED ORDER — PREGABALIN 25 MG PO CAPS
50.0000 mg | ORAL_CAPSULE | Freq: Two times a day (BID) | ORAL | Status: DC
  Administered 2015-08-28 – 2015-08-31 (×6): 50 mg via ORAL
  Filled 2015-08-26 (×10): qty 2

## 2015-08-26 MED ORDER — FENTANYL 75 MCG/HR TD PT72
75.0000 ug | MEDICATED_PATCH | TRANSDERMAL | Status: DC
  Administered 2015-08-26 – 2015-09-01 (×3): 75 ug via TRANSDERMAL
  Filled 2015-08-26 (×6): qty 1

## 2015-08-26 MED ORDER — SENNA 8.6 MG PO TABS
2.0000 | ORAL_TABLET | Freq: Once | ORAL | Status: DC
  Filled 2015-08-26: qty 2

## 2015-08-26 MED ORDER — SODIUM CHLORIDE 0.9 % IV SOLN
90.0000 mg | Freq: Once | INTRAVENOUS | Status: AC
  Administered 2015-08-26: 90 mg via INTRAVENOUS
  Filled 2015-08-26: qty 10

## 2015-08-26 MED ORDER — DIAZEPAM 5 MG PO TABS
5.0000 mg | ORAL_TABLET | Freq: Two times a day (BID) | ORAL | Status: DC
  Administered 2015-08-28 – 2015-09-03 (×7): 5 mg via ORAL
  Filled 2015-08-26 (×11): qty 1

## 2015-08-26 MED ORDER — DIAZEPAM 5 MG/ML IJ SOLN
10.0000 mg | Freq: Once | INTRAMUSCULAR | Status: AC
Start: 2015-08-26 — End: 2015-08-26
  Administered 2015-08-26: 10 mg via INTRAVENOUS
  Filled 2015-08-26: qty 2

## 2015-08-26 MED ORDER — SODIUM CHLORIDE 0.9 % IV SOLN
25.0000 ug/h | INTRAVENOUS | Status: DC
  Filled 2015-08-26: qty 50

## 2015-08-26 NOTE — Progress Notes (Signed)
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 At that time, also noted to have a urinary tract infection. Patient seen by orthopedic surgery and underwent irrigation and debridement of hip on 2/19. Cultures as well as blood cultures positive for MRSA. Infectious disease consulted. Patient has been on vancomycin since. Cardiology consulted for TEE which was negative as were repeat blood cultures.    On 2/24, patient underwent aspiration of left chest wall. Plan was for long-term IV antibiotics with PICC line placed on 2/28. 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 He started spiking fevers on 3/3 and infectious disease reconsulted.  Other complications during hospitalization include severe urinary retention requiring Foley catheter, blood transfusions on 2/20 and 3/1 and most significantly, severe pain that has been extremely difficult to control or oversedation.  Palliative care consulted on 3/7.  D/w niece & sister & they highlight heavy beer drinking which would account for his falls & broken bones.    Today, patient calm and awake, although still confused.    Assessment/Plan: Principal Problem:   Sepsis with MRSA polynephritis, infected right hip post fracture repair with secondary right gluteal abscess, left scapular phlegmon and left chest wall. Status post I&D 2. Currently on vancomycin and rifampin. Appreciate infectious  disease help. Now afebrile 48 hours. Repeat blood cultures have been negative. On wound VAC. Active Problems:   UTI (lower urinary tract infection): Felt to have been treated, given antibiotics.    DM type 2, goal HbA1c < 7% (HCC) poor by mouth intake. Blood sugars stable. On supplemental shakes. 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 Now normalized. Stable.    Acute urinary retention: Now resolved. Passed voiding trial.  Heavy alcohol abuse: out of window for withdrawals.  This would account for his falls & multiple fractures  Code Status: Full code   Family Communication: l spoke with sister by phone. There is no dedicated power of attorney medically. I discussed that the patient is going to face a life of being unable to take care of himself, we'll need likely nursing home care and is looking at life severe chronic pain. She will speak with the other members of the family and suspects that they are opting for comfort care.    Disposition Plan: family deciding on possibly trending toward comfort care   Consultants:  Infectious Disease   IR  Orthopedics  Palliative Care  Cardiology  Procedures: 2/19 - I&D infected R hip wound  2/20 1 units packed red blood cells transfused 2/24 TEE EF= 60% to 65%. 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 with excisional debridement of the skin, muscle, bone 25 x 5 cm w/ incision and drainage of right gluteal abscess & partial secondary closure  of previous surgical incision  Antibiotics: Aztreonam 2/18-2/19 Vancomycin 2/18-present Ceftriaxone 2/19-2/20 Rifampin 2/19- present   Objective: BP 118/56 mmHg  Pulse 97  Temp(Src) 98 F (36.7 C) (Axillary)  Resp 18  Ht 5\' 9"   (1.753 m)  Wt 73.1 kg (161 lb 2.5 oz)  BMI 23.79 kg/m2  SpO2 97%  Intake/Output Summary (Last 24 hours) at 08/26/15 1144 Last data filed at 08/26/15 0900  Gross per 24 hour  Intake 3271.08 ml  Output   1000 ml  Net 2271.08 ml   Filed Weights   08/09/15 0255  Weight: 73.1 kg (161 lb 2.5 oz)    Exam:   General:  No acute distress, doesn't really follow commands  Cardiovascular: Regular rate and rhythm,  Respiratory: poor inspiratory effort  Abdomen: Soft, non-tender?, Minimal distention, hypoactive bowel sounds    Musculoskeletal:    emaciated   Data Reviewed: Basic Metabolic Panel:  Recent Labs Lab 08/21/15 0508 08/23/15 0540 08/24/15 1215 08/25/15 0500  NA 125* 133* 135 137  K 4.2 4.6 4.8 4.3  CL 91* 102 105 108  CO2 24 22 23 22   GLUCOSE 116* 91 127* 124*  BUN <5* 15 17 20   CREATININE 0.56* 1.04 1.18 1.18  CALCIUM 8.7* 8.7* 8.7* 8.9   Liver Function Tests:  Recent Labs Lab 08/24/15 1215  AST 44*  ALT 17  ALKPHOS 172*  BILITOT 1.0  PROT 6.7  ALBUMIN 2.2*   No results for input(s): LIPASE, AMYLASE in the last 168 hours.  Recent Labs Lab 08/24/15 1456  AMMONIA 30   CBC:  Recent Labs Lab 08/20/15 0200 08/21/15 0508 08/24/15 1215  WBC 14.5* 10.2 12.1*  NEUTROABS  --  7.3 10.0*  HGB 9.1* 8.7* 8.5*  HCT 27.8* 26.0* 26.2*  MCV 78.5 78.5 80.1  PLT 499* 467* 374   Cardiac Enzymes:    Recent Labs Lab 08/19/15 2050 08/20/15 0200 08/20/15 0810  TROPONINI <0.03 <0.03 <0.03   BNP (last 3 results)  Recent Labs  06/11/15 1106 07/02/15 0538  BNP 27.7 34.0    ProBNP (last 3 results) No results for input(s): PROBNP in the last 8760 hours.  CBG:  Recent Labs Lab 08/25/15 0656 08/25/15 1111 08/25/15 1646 08/25/15 2111 08/26/15 0633  GLUCAP 113* 102* 122* 115* 123*    Recent Results (from the past 240 hour(s))  C difficile quick scan w PCR reflex     Status: None   Collection Time: 08/17/15  1:47 PM  Result Value Ref Range  Status   C Diff antigen NEGATIVE NEGATIVE Final   C Diff toxin NEGATIVE NEGATIVE Final   C Diff interpretation Negative for toxigenic C. difficile  Final  Culture, blood (Routine X 2) w Reflex to ID Panel     Status: None   Collection Time: 08/20/15 11:18 AM  Result Value Ref Range Status   Specimen Description BLOOD BLOOD RIGHT ARM  Final   Special Requests IN PEDIATRIC BOTTLE 3CC  Final   Culture NO GROWTH 5 DAYS  Final   Report Status 08/25/2015 FINAL  Final  Culture, blood (Routine X 2) w Reflex to ID Panel     Status: None   Collection Time: 08/20/15 11:22 AM  Result Value Ref Range Status   Specimen Description BLOOD BLOOD RIGHT HAND  Final   Special Requests IN PEDIATRIC BOTTLE 3CC  Final   Culture NO GROWTH 5 DAYS  Final   Report Status 08/25/2015 FINAL  Final  Culture, Urine     Status:  None   Collection Time: 08/20/15 12:11 PM  Result Value Ref Range Status   Specimen Description URINE, CATHETERIZED  Final   Special Requests NONE  Final   Culture NO GROWTH 1 DAY  Final   Report Status 08/21/2015 FINAL  Final  Culture, blood (routine x 2)     Status: None (Preliminary result)   Collection Time: 08/23/15  3:59 PM  Result Value Ref Range Status   Specimen Description BLOOD RIGHT HAND  Final   Special Requests IN PEDIATRIC BOTTLE 3CC  Final   Culture NO GROWTH 2 DAYS  Final   Report Status PENDING  Incomplete  Culture, blood (routine x 2)     Status: None (Preliminary result)   Collection Time: 08/23/15  3:59 PM  Result Value Ref Range Status   Specimen Description BLOOD RIGHT ARM  Final   Special Requests IN PEDIATRIC BOTTLE 3CC  Final   Culture NO GROWTH 2 DAYS  Final   Report Status PENDING  Incomplete     Studies: No results found.  Scheduled Meds: . sodium chloride   Intravenous Once  . aspirin EC  325 mg Oral Daily  . [START ON 08/27/2015] bisacodyl  10 mg Oral Daily  . diazepam  10 mg Intravenous Once  . diazepam  5 mg Oral BID  . feeding supplement  (ENSURE ENLIVE)  237 mL Oral TID BM  . feeding supplement (PRO-STAT SUGAR FREE 64)  30 mL Oral BID  . fentaNYL  75 mcg Transdermal Q72H  . pamidronate (AREDIA) 90 mg IVPB  90 mg Intravenous Once  . pregabalin  50 mg Oral BID  . rifampin  600 mg Oral Daily  . senna  2 tablet Oral Once  . sodium chloride flush  10-40 mL Intracatheter Q12H  . sodium phosphate  1 enema Rectal Once  . tamsulosin  0.4 mg Oral Daily  . vancomycin  1,000 mg Intravenous Q24H    Continuous Infusions: . sodium chloride 100 mL/hr at 08/26/15 1112  . fentaNYL infusion INTRAVENOUS       Time spent: 25 minutes  Hollice Espy  [facility] Pager [phone] . If 7PM-7AM, please contact night-coverage at [email or web address], password [facility] 08/26/2015, 11:44 AM  LOS: 19 days

## 2015-08-26 NOTE — Progress Notes (Signed)
Daily Progress Note   Patient Name: Jeff Ferrell       Date: 08/26/2015 DOB: 06/19/1950  Age: 65 y.o. MRN#: [id number] Attending Physician: Hollice EspySendil K Krishnan, MD Primary Care Physician: Daisy FloroWYMER,ANTOINETTE, MD Admit Date: 08/07/2015  Reason for Consultation/Follow-up: Establishing goals of care, Pain control and Psychosocial/spiritual support  Subjective: Jeff Ferrell appears to be more comfortable today-he is calmer, but still has what appears to be spasms and involuntary contractions of his lower extremities-pulling them up to his chest- he is only able to tell me it "hurts in my joints", he motions in the area of his pelvis. He continues to have what appears to be intermittent sharp pain. Temps are down. Foley was placed for large volume urinary retention. I do not see a stool documented for over 7 days.  Interval Events: Palliative consult 3/7 Length of Stay: 19 days  Current Medications: Scheduled Meds:  . sodium chloride   Intravenous Once  . aspirin EC  325 mg Oral Daily  . [START ON 08/27/2015] bisacodyl  10 mg Oral Daily  . diazepam  5 mg Oral BID  . feeding supplement (ENSURE ENLIVE)  237 mL Oral TID BM  . feeding supplement (PRO-STAT SUGAR FREE 64)  30 mL Oral BID  . fentaNYL  75 mcg Transdermal Q72H  . pamidronate (AREDIA) 90 mg IVPB  90 mg Intravenous Once  . pregabalin  50 mg Oral BID  . rifampin  600 mg Oral Daily  . sodium chloride flush  10-40 mL Intracatheter Q12H  . tamsulosin  0.4 mg Oral Daily  . vancomycin  1,000 mg Intravenous Q24H    Continuous Infusions: . sodium chloride 75 mL/hr at 08/26/15 0910  . fentaNYL infusion INTRAVENOUS      PRN Meds: acetaminophen **OR** acetaminophen, alum & mag hydroxide-simeth, diazepam, fentaNYL, naLOXone (NARCAN)  injection,  ondansetron **OR** ondansetron (ZOFRAN) IV, sodium chloride flush  Physical Exam: Physical Exam  Nursing note and vitals reviewed.               Vital Signs: BP 118/56 mmHg  Pulse 97  Temp(Src) 98 F (36.7 C) (Axillary)  Resp 18  Ht 5\' 9"  (1.753 m)  Wt 73.1 kg (161 lb 2.5 oz)  BMI 23.79 kg/m2  SpO2 97% SpO2: SpO2: 97 % O2 Device: O2 Device: Not Delivered O2 Flow  Rate: O2 Flow Rate (L/min): 2 L/min  Intake/output summary:  Intake/Output Summary (Last 24 hours) at 08/26/15 1035 Last data filed at 08/26/15 0900  Gross per 24 hour  Intake 3271.08 ml  Output   1000 ml  Net 2271.08 ml   LBM: Last BM Date: 08/20/15 Baseline Weight: Weight: 73.1 kg (161 lb 2.5 oz) Most recent weight: Weight: 73.1 kg (161 lb 2.5 oz)       Palliative Assessment/Data: Flowsheet Rows        Most Recent Value   Intake Tab    Referral Department  Hospitalist   Unit at Time of Referral  Orthopedic Unit   Palliative Care Primary Diagnosis  Pain   Date Notified  08/23/15   Palliative Care Type  New Palliative care   Reason for referral  Pain   Date of Admission  08/07/15   # of days IP prior to Palliative referral  16   Clinical Assessment    Palliative Performance Scale Score  20%   Pain Max last 24 hours  10   Pain Min Last 24 hours  10   Dyspnea Max Last 24 Hours  0   Dyspnea Min Last 24 hours  0   Nausea Max Last 24 Hours  0   Nausea Min Last 24 Hours  0   Anxiety Max Last 24 Hours  0   Anxiety Min Last 24 Hours  0   Psychosocial & Spiritual Assessment    Palliative Care Outcomes       Additional Data Reviewed: CBC    Component Value Date/Time   WBC 12.1* 08/24/2015 1215   RBC 3.27* 08/24/2015 1215   HGB 8.5* 08/24/2015 1215   HCT 26.2* 08/24/2015 1215   PLT 374 08/24/2015 1215   MCV 80.1 08/24/2015 1215   MCH 26.0 08/24/2015 1215   MCHC 32.4 08/24/2015 1215   RDW 15.0 08/24/2015 1215   LYMPHSABS 1.2 08/24/2015 1215   MONOABS 0.9 08/24/2015 1215   EOSABS 0.1 08/24/2015  1215   BASOSABS 0.0 08/24/2015 1215    CMP     Component Value Date/Time   NA 137 08/25/2015 0500   K 4.3 08/25/2015 0500   CL 108 08/25/2015 0500   CO2 22 08/25/2015 0500   GLUCOSE 124* 08/25/2015 0500   BUN 20 08/25/2015 0500   CREATININE 1.18 08/25/2015 0500   CALCIUM 8.9 08/25/2015 0500   PROT 6.7 08/24/2015 1215   ALBUMIN 2.2* 08/24/2015 1215   AST 44* 08/24/2015 1215   ALT 17 08/24/2015 1215   ALKPHOS 172* 08/24/2015 1215   BILITOT 1.0 08/24/2015 1215   GFRNONAA >60 08/25/2015 0500   GFRAA >60 08/25/2015 0500       Problem List:  Patient Active Problem List   Diagnosis Date Noted  . Fever 08/23/2015  . Diabetic foot ulcer (HCC)   . Pleural effusion   . Gluteal abscess   . Chest wall abscess   . Low back pain   . Muscle spasms of both lower extremities   . Controlled diabetes mellitus type 2 with complications (HCC)   . Hypokalemia   . Acute pyelonephritis   . Sepsis due to methicillin resistant Staphylococcus aureus (MRSA) (HCC)   . Uncontrolled type 2 diabetes mellitus with complication (HCC)   . Acute urinary retention   . Hip fracture requiring operative repair (HCC)   . MRSA bacteremia   . Staphylococcus aureus bacteremia with sepsis (HCC)   . Osteomyelitis of right hip (HCC)   .  Hardware complicating wound infection (HCC)   . Infection 08/08/2015  . Sepsis (HCC) 08/07/2015  . DM type 2, goal HbA1c < 7% (HCC) 08/07/2015  . Chronic hyponatremia 08/07/2015  . Fracture of femoral neck, right, closed 07/19/2015  . Femoral neck fracture, right, closed, initial encounter 07/19/2015  . Protein-calorie malnutrition, severe 07/19/2015  . Leg weakness, bilateral 07/02/2015  . Left leg swelling 07/02/2015  . Left-sided chest wall pain   . Acute blood loss anemia 06/21/2015  . Hyponatremia 06/20/2015  . Femur fracture, left (HCC) 06/17/2015  . Discitis of lumbar region   . Left hip pain   . Hypoglycemia 06/16/2015  . UTI (lower urinary tract infection)  06/11/2015  . Back pain 06/11/2015  . Malnutrition of moderate degree 03/30/2015  . Diabetes mellitus (HCC) 03/29/2015     Palliative Care Assessment & Plan   1. Complex Pain Management- Multifactorial-Acute and Chronic  I suspect some of his pain and mental status changes were due to severe urinary retention on 3/7-foley has been placed and should remain in place until we have a clearer picture of his illness trajectory.Dramatic decrease in PRN pain meds after foley was paced  He has not had a bowel movement since 3/3 confirmed with RN, will start and aggressive bowel regimen today  Will begin to transition his infusion to a duragesic patch-start at , maintain infusion at reduced dose 25 mcg for next 24 hours then stop, discussed pain management plan with RN in detail. Bolus dosing will continue PRN.  Added Lyrica 50 mg BID for neuropathic pain  Scheduled BID Valium for muscle spasms and anxiety- he has a very significant and heavy drinking/drug abuse history  Will give one dose of Pamidronate for pain control.  2. Discussed case in detail with Radiologist Dr. Margo Aye- no signs of malignancy associated with bone on MRI, osteopenia-fractures are unusual-multiple ribs, hips and no known trauma documented. Will obtain interval CT of his pelvis today.  3. Dr. Rito Ehrlich has spoken with patients niece and sister who confirm that Blaise has a heavy drinking history, his two sons are estranged and the patient minimizes the severity of his ETOH abuse. There is no HCPOA, sister and niece are the only available family. Palliative is available to hold a family meeting to discuss goals of care. At best Roscoe is looking at living in a SNF with assistance for what will likely be the duration of his life- I do not believe even if he can get over this serious illness and multiple infections that he will return to independent living - his pain is the limiting factor at least for now. It would be  completely appropriate to shift to full comfort care given his current level of suffering.    Care plan was discussed with radiology, hospitalist, RN.  Thank you for allowing the Palliative Medicine Team to assist in the care of this patient.   Time In: 9:30 Time Out: 11AM Total Time 90 Prolonged Time Billed YES        Edsel Petrin, DO  08/26/2015, 10:35 AM  Please contact Palliative Medicine Team phone at [phone] for questions and concerns.

## 2015-08-26 NOTE — Progress Notes (Signed)
Patient is stable with mittens on.  VAC re applied by RN with good seal and suction.  Stable from ortho stand point.  Follow up with me in 2 weeks for wound check.  Jeff Ferrell. Michael Xu, MD [facility] [phone] 7:42 AM

## 2015-08-27 DIAGNOSIS — R52 Pain, unspecified: Secondary | ICD-10-CM

## 2015-08-27 LAB — CBC
HEMATOCRIT: 26.5 % — AB (ref 39.0–52.0)
HEMOGLOBIN: 8.1 g/dL — AB (ref 13.0–17.0)
MCH: 24.3 pg — AB (ref 26.0–34.0)
MCHC: 30.6 g/dL (ref 30.0–36.0)
MCV: 79.6 fL (ref 78.0–100.0)
Platelets: 407 10*3/uL — ABNORMAL HIGH (ref 150–400)
RBC: 3.33 MIL/uL — ABNORMAL LOW (ref 4.22–5.81)
RDW: 15.2 % (ref 11.5–15.5)
WBC: 8.6 10*3/uL (ref 4.0–10.5)

## 2015-08-27 LAB — GLUCOSE, CAPILLARY: GLUCOSE-CAPILLARY: 102 mg/dL — AB (ref 65–99)

## 2015-08-27 MED ORDER — FENTANYL CITRATE (PF) 100 MCG/2ML IJ SOLN
75.0000 ug | INTRAMUSCULAR | Status: DC | PRN
  Administered 2015-08-27 – 2015-09-03 (×29): 75 ug via INTRAVENOUS
  Filled 2015-08-27 (×29): qty 2

## 2015-08-27 NOTE — Progress Notes (Signed)
Rec'd request from PMT DO Julaine FusiBeth Golding to set up meeting with son Eino Farberhillip Bernabe [phone]); nurse reports that he called stating [facility] [place] Phillips OdorGolding req in-person meeting with all children, siblings, nieces and nephews. [facility]; left message requesting call back to PMT cell phone.  Donn PieriniMelanie G. Oliver, RN, BSN, [facility] 08/27/2015 2:29 PM Cell [phone] 8:00-4:00 Monday-Friday Office [phone]

## 2015-08-27 NOTE — Progress Notes (Signed)
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 At that time, also noted to have a urinary tract infection. Patient seen by orthopedic surgery and underwent irrigation and debridement of hip on 2/19. Cultures as well as blood cultures positive for MRSA. Infectious disease consulted. Patient has been on vancomycin since. Cardiology consulted for TEE which was negative as were repeat blood cultures.    On 2/24, patient underwent aspiration of left chest wall. Plan was for long-term IV antibiotics with PICC line placed on 2/28. 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 He started spiking fevers on 3/3 and infectious disease reconsulted.  Other complications during hospitalization include severe urinary retention requiring Foley catheter, blood transfusions on 2/20 and 3/1 and most significantly, severe pain that has been extremely difficult to control or oversedation.  Palliative care consulted on 3/7.  D/w niece & sister & they highlight heavy beer drinking which would account for his falls & broken bones.    Patient today laying in bed, occasionally moaning in pain. Had an extensive discussion with patient's son who lives in [place] and has not seen patient since admission. Patient's son initially angry thinking that we are trying to give him medication to keep him sedated. I assured him that is the case. Covered comprehensive history of the  patient prior to this admission and including what has been going on this admission. It is the son's goal to try to take the patient home with him on IV antibiotics at some point. However, the patient's son has not seen the patient yet and person. Palliative care is going to try to have a goals of care meeting with the entire family early next week.  Assessment/Plan: Principal Problem:   Sepsis with MRSA polynephritis, infected right hip post fracture repair with secondary right gluteal abscess, left scapular phlegmon and left chest wall. Status post I&D 2. Currently on vancomycin and rifampin. Appreciate infectious disease help. Continues to remain afebrile Repeat blood cultures have been negative. On wound VAC. Active Problems:   UTI (lower urinary tract infection): Felt to have been treated, given antibiotics.    DM type 2, goal HbA1c < 7% (HCC) poor by mouth intake. Blood sugars stable. On supplemental shakes. 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 Now normalized. Stable.    Acute urinary retention: Now resolved. Passed voiding trial.  Heavy alcohol abuse: out of window for withdrawals.  This would account for his falls & multiple fractures  Code Status: Full code   Family Communication: As mentioned above, have spoke with multiple family members including a son today who is quite hostile. Goals of care meeting by palliative care for the entire family early next week  Disposition Plan: Hopefully be determined early next week a goals of care meeting   Consultants:  Infectious Disease   IR  Orthopedics  Palliative Care  Cardiology  Procedures: 2/19 - I&D infected R hip wound  2/20 1 units packed red blood  cells transfused 2/24 TEE EF= 60% to 65%. 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 with excisional debridement of the skin, muscle, bone 25 x 5 cm w/ incision and drainage of right gluteal abscess & partial secondary closure of previous surgical incision  Antibiotics: Aztreonam 2/18-2/19 Vancomycin 2/18-present Ceftriaxone 2/19-2/20 Rifampin 2/19- present   Objective: BP 130/81 mmHg  Pulse 117  Temp(Src) 98.9 F (37.2 C) (Axillary)  Resp 18  Ht 5\' 9"  (1.753 m)  Wt 73.1 kg (161 lb 2.5 oz)  BMI 23.79 kg/m2  SpO2 99%  Intake/Output Summary (Last 24 hours) at 08/27/15 1418 Last data filed at 08/27/15 1300  Gross per 24 hour  Intake     10 ml  Output   3000 ml  Net  -2990 ml   Filed Weights   08/09/15 0255  Weight: 73.1 kg (161 lb 2.5 oz)    Exam:   General:  Looks to be in pain  Cardiovascular: Regular rhythm, tachycardic  Respiratory: poor inspiratory effort  Abdomen: Soft, non-tender?, Minimal distention, hypoactive bowel sounds    Musculoskeletal:    emaciated   Data Reviewed: Basic Metabolic Panel:  Recent Labs Lab 08/21/15 0508 08/23/15 0540 08/24/15 1215 08/25/15 0500  NA 125* 133* 135 137  K 4.2 4.6 4.8 4.3  CL 91* 102 105 108  CO2 24 22 23 22   GLUCOSE 116* 91 127* 124*  BUN <5* 15 17 20   CREATININE 0.56* 1.04 1.18 1.18  CALCIUM 8.7* 8.7* 8.7* 8.9   Liver Function Tests:  Recent Labs Lab 08/24/15 1215  AST 44*  ALT 17  ALKPHOS 172*  BILITOT 1.0  PROT 6.7  ALBUMIN 2.2*   No results for input(s): LIPASE, AMYLASE in the last 168 hours.  Recent Labs Lab 08/24/15 1456  AMMONIA 30   CBC:  Recent Labs Lab 08/21/15 0508 08/24/15 1215 08/26/15 1145 08/27/15 0443  WBC 10.2 12.1* 10.3 8.6  NEUTROABS 7.3 10.0*  --   --   HGB 8.7* 8.5* 7.9* 8.1*  HCT 26.0* 26.2* 24.3* 26.5*  MCV 78.5 80.1 80.2 79.6  PLT 467* 374 370 407*   Cardiac Enzymes:   No results for input(s): CKTOTAL, CKMB, CKMBINDEX, TROPONINI in the last 168 hours. BNP (last  3 results)  Recent Labs  06/11/15 1106 07/02/15 0538  BNP 27.7 34.0    ProBNP (last 3 results) No results for input(s): PROBNP in the last 8760 hours.  CBG:  Recent Labs Lab 08/25/15 1111 08/25/15 1646 08/25/15 2111 08/26/15 0633 08/26/15 2154  GLUCAP 102* 122* 115* 123* 92    Recent Results (from the past 240 hour(s))  Culture, blood (Routine X 2) w Reflex to ID Panel     Status: None   Collection Time: 08/20/15 11:18 AM  Result Value Ref Range Status   Specimen Description BLOOD BLOOD RIGHT ARM  Final   Special Requests IN PEDIATRIC BOTTLE 3CC  Final   Culture NO GROWTH 5 DAYS  Final   Report Status 08/25/2015 FINAL  Final  Culture, blood (Routine X 2) w Reflex to ID Panel     Status: None   Collection Time: 08/20/15 11:22 AM  Result Value Ref Range Status   Specimen Description BLOOD BLOOD RIGHT HAND  Final   Special Requests IN  PEDIATRIC BOTTLE 3CC  Final   Culture NO GROWTH 5 DAYS  Final   Report Status 08/25/2015 FINAL  Final  Culture, Urine     Status: None   Collection Time: 08/20/15 12:11 PM  Result Value Ref Range Status   Specimen Description URINE, CATHETERIZED  Final   Special Requests NONE  Final   Culture NO GROWTH 1 DAY  Final   Report Status 08/21/2015 FINAL  Final  Culture, blood (routine x 2)     Status: None (Preliminary result)   Collection Time: 08/23/15  3:59 PM  Result Value Ref Range Status   Specimen Description BLOOD RIGHT HAND  Final   Special Requests IN PEDIATRIC BOTTLE 3CC  Final   Culture NO GROWTH 4 DAYS  Final   Report Status PENDING  Incomplete  Culture, blood (routine x 2)     Status: None (Preliminary result)   Collection Time: 08/23/15  3:59 PM  Result Value Ref Range Status   Specimen Description BLOOD RIGHT ARM  Final   Special Requests IN PEDIATRIC BOTTLE 3CC  Final   Culture NO GROWTH 4 DAYS  Final   Report Status PENDING  Incomplete     Studies: No results found.  Scheduled Meds: . sodium chloride    Intravenous Once  . aspirin EC  325 mg Oral Daily  . bisacodyl  10 mg Oral Daily  . diazepam  5 mg Oral BID  . feeding supplement (ENSURE ENLIVE)  237 mL Oral TID BM  . feeding supplement (PRO-STAT SUGAR FREE 64)  30 mL Oral BID  . fentaNYL  75 mcg Transdermal Q72H  . pregabalin  50 mg Oral BID  . rifampin  600 mg Oral Daily  . senna  2 tablet Oral Once  . sodium chloride flush  10-40 mL Intracatheter Q12H  . sodium phosphate  1 enema Rectal Once  . tamsulosin  0.4 mg Oral Daily  . vancomycin  1,000 mg Intravenous Q24H    Continuous Infusions: . sodium chloride 100 mL/hr at 08/27/15 0243     Time spent: 25 minutes  Hollice Espy  [facility] Pager [phone] . If 7PM-7AM, please contact night-coverage at [email or web address], password [facility] 08/27/2015, 2:18 PM  LOS: 20 days

## 2015-08-27 NOTE — Clinical Social Work Note (Signed)
CSW continuing to follow patient's progress.  MD requested palliative to meet with sister and niece to determine goals of care and discharge plan.  Patient is from [facility], CSW updated [facility] that patient not medically ready for discharge yet per MD.  Ervin KnackEric R. Justice Milliron, MSW, Theresia MajorsLCSWA [phone] 08/27/2015 6:14 PM

## 2015-08-27 NOTE — Progress Notes (Signed)
Patient ID: Jeff FerrariHarold Romick, male   DOB: 03/02/1951, 65 y.o.   MRN: [id number]         [facility]     Date of Admission:  08/07/2015   Total days of antibiotics 20  He remains poorly responsive. He is laying in bed and intermittently moaning with pain. He does not respond to questions. His family is considering goals of care. I will continue IV cefazolin and rifampin for now. Please call Dr. Enedina FinnerJeff Hatcher [phone] for any infectious disease questions this weekend.           Cliffton AstersJohn Dorsey Authement, MD [facility] [phone] pager   [phone] cell 06/22/2015, 1:32 PM

## 2015-08-27 NOTE — Progress Notes (Signed)
Physical Therapy Treatment Patient Details Name: Jeff FerrariHarold Ferrell MRN: [id number] DOB: 06/08/1951 Today's Date: 08/27/2015    History of Present Illness Pt is a 65 y/o M w/ recent fx of Rt femoral neck w/ repair on 07/21/15 who presents w/ wound infection, now s/p I&D.  On 3/1 pt underwent repeat I&D Rt hip and had a Rt gluteal abscess drained.PT w/ MRSA UTI and tachycardia.  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    PT Comments    Pt performed rolling and positioning to L and required +2 assist to prop with foam and pillow to avoid further skin breakdown.    Follow Up Recommendations  SNF;Supervision/Assistance - 24 hour     Equipment Recommendations  None recommended by PT    Recommendations for Other Services       Precautions / Restrictions Precautions Precautions: Fall Precaution Comments: Wound vac; Bil LE extreme spasms with bil knee flexion maintained throughout, R>L Restrictions Weight Bearing Restrictions: Yes RLE Weight Bearing: Weight bearing as tolerated LLE Weight Bearing: Weight bearing as tolerated    Mobility  Bed Mobility Overal bed mobility: Needs Assistance Bed Mobility: Rolling Rolling: Total assist;+2 for physical assistance (Pt able to reach for PTA but unable to provided assistance in conjuction with assist from PTA and rehab tech.  )         General bed mobility comments: Pt refused to progress mobility to sitting edge of bed.  Pt rolled to L side to reposition in bed.  Pt required increased time to position to avoid further skin break down.  Pt educated on propping and position to maintain neutral alignment.  Increased padding placed between pt legs to improve hip abduction.  Pt unable to extend B knee secondary to pain.    Transfers Overall transfer level:  (Pt unable to progress to transfer training.  )                  Ambulation/Gait                 Stairs             Wheelchair Mobility    Modified Rankin (Stroke Patients Only)       Balance     Sitting balance-Leahy Scale: Zero (unable to assess.  )       Standing balance-Leahy Scale: Zero (unable to assess)                      Cognition Arousal/Alertness: Awake/alert Behavior During Therapy: Flat affect Overall Cognitive Status: Impaired/Different from baseline Area of Impairment: Problem solving;Orientation;Safety/judgement Orientation Level: Disoriented to;Place;Time;Situation Current Attention Level: Sustained   Following Commands: Follows one step commands inconsistently Safety/Judgement: Decreased awareness of safety   Problem Solving: Requires verbal cues General Comments: Pt able to reach for therapist and rails with B UEs.     Exercises      General Comments        Pertinent Vitals/Pain Pain Assessment: 0-10 Pain Score: 10-Worst pain ever Pain Location: Pt reports hurting throughout.   Pain Descriptors / Indicators: Grimacing;Guarding;Moaning;Spasm Pain Intervention(s): Limited activity within patient's tolerance;Monitored during session;Premedicated before session;Repositioned;Relaxation    Home Living                      Prior Function            PT Goals (current goals can now be found in the  care plan section) Acute Rehab PT Goals Patient Stated Goal: none stated Potential to Achieve Goals: Fair Progress towards PT goals: Progressing toward goals (remains limited secondary to pain.  )    Frequency  Min 3X/week    PT Plan Current plan remains appropriate    Co-evaluation             End of Session   Activity Tolerance: Patient limited by pain Patient left: with call bell/phone within reach;in bed;with bed alarm set;with restraints reapplied     Time: 0911-0926 PT Time Calculation (min) (ACUTE ONLY): 15 min  Charges:  $Therapeutic Activity: 8-22 mins                    G Codes:      Jeff Ferrell 2015-08-31,  12:59 PM  Jeff Ferrell, PTA pager [phone]

## 2015-08-28 DIAGNOSIS — F11988 Opioid use, unspecified with other opioid-induced disorder: Secondary | ICD-10-CM

## 2015-08-28 DIAGNOSIS — R208 Other disturbances of skin sensation: Secondary | ICD-10-CM

## 2015-08-28 LAB — BASIC METABOLIC PANEL
Anion gap: 9 (ref 5–15)
BUN: 9 mg/dL (ref 6–20)
CHLORIDE: 103 mmol/L (ref 101–111)
CO2: 25 mmol/L (ref 22–32)
CREATININE: 0.94 mg/dL (ref 0.61–1.24)
Calcium: 8.1 mg/dL — ABNORMAL LOW (ref 8.9–10.3)
GFR calc non Af Amer: >60 mL/min (ref >60)
Glucose, Bld: 111 mg/dL — ABNORMAL HIGH (ref 65–99)
Potassium: 3.5 mmol/L (ref 3.5–5.1)
Sodium: 137 mmol/L (ref 135–145)

## 2015-08-28 LAB — CBC
HEMATOCRIT: 25.8 % — AB (ref 39.0–52.0)
HEMOGLOBIN: 8 g/dL — AB (ref 13.0–17.0)
MCH: 24.7 pg — ABNORMAL LOW (ref 26.0–34.0)
MCHC: 31 g/dL (ref 30.0–36.0)
MCV: 79.6 fL (ref 78.0–100.0)
Platelets: 371 10*3/uL (ref 150–400)
RBC: 3.24 MIL/uL — AB (ref 4.22–5.81)
RDW: 15.2 % (ref 11.5–15.5)
WBC: 7.8 10*3/uL (ref 4.0–10.5)

## 2015-08-28 LAB — GLUCOSE, CAPILLARY
GLUCOSE-CAPILLARY: 99 mg/dL (ref 65–99)
Glucose-Capillary: 121 mg/dL — ABNORMAL HIGH (ref 65–99)

## 2015-08-28 LAB — CULTURE, BLOOD (ROUTINE X 2)
Culture: NO GROWTH
Culture: NO GROWTH

## 2015-08-28 LAB — VANCOMYCIN, TROUGH: Vancomycin Tr: 10 ug/mL (ref 10.0–20.0)

## 2015-08-28 MED ORDER — VANCOMYCIN HCL IN DEXTROSE 1-5 GM/200ML-% IV SOLN
1000.0000 mg | Freq: Two times a day (BID) | INTRAVENOUS | Status: DC
  Administered 2015-08-29 – 2015-09-01 (×7): 1000 mg via INTRAVENOUS
  Filled 2015-08-28 (×9): qty 200

## 2015-08-28 MED ORDER — WHITE PETROLATUM GEL
Status: AC
  Administered 2015-08-28: 1
  Filled 2015-08-28: qty 1

## 2015-08-28 MED ORDER — BACLOFEN 10 MG PO TABS
10.0000 mg | ORAL_TABLET | Freq: Three times a day (TID) | ORAL | Status: DC
  Administered 2015-08-28 (×2): 10 mg via ORAL
  Filled 2015-08-28 (×2): qty 1

## 2015-08-28 NOTE — Progress Notes (Signed)
RN gave patients son the director of nursing, Ferne Reusngela Daye, business card to address further concerns.

## 2015-08-28 NOTE — Progress Notes (Signed)
Pharmacy Antibiotic Note Jeff Ferrell is a 65 y.o. male admitted on 08/07/2015 with MRSA bacteremia, R hip osteo, and multiple abscesses concerning for infection.  Now day #18 of abx for MRSA bacteremia w/ R hip osteo w/ hardware (s/p I&D 2/19; hip was repaired on 2/1). TEE neg vegetations.   Per ID: continue IV vancomycin and rifampin x 8 weeks postop (thru 09/18/15) then will need 10 months plus of oral suppressive abx. Has had repeated I&D's with last one being on 3/1.   Pt developed some worsening renal function along with a decline in UOP and subsequent VT was higher than desired on 3/6. Doses were held and  vancomycin was restarted at lower dose of 1 gram Q24H on 03/08 with corresponding VT today of 10; goal is ~ 20. SCr/BUN and UOP are improved and BP is higher/improved.   Plan: 1. Adjust vancomycin to 1 gram Q 12 hours as hopeful that renal function continues to trend back toward baseline 2. Continue rifampin  PO daily 3. Monitor clinical picture, renal function, VT weekly when stable 4. F/U C&S, abx deescalation / LOT  Height:  (175.3 cm) Weight: 161 lb 2.5 oz (73.1 kg) IBW/kg (Calculated) : 70.7  Temp (24hrs), Avg:99.4 F (37.4 C), Min:99 F (37.2 C), Max:100 F (37.8 C)   Recent Labs Lab 08/23/15 0540 08/23/15 1055 08/24/15 1215 08/25/15 0500 08/25/15 0520 08/26/15 1145 08/27/15 0443 08/28/15 0552 08/28/15 1455  WBC  --   --  12.1*  --   --  10.3 8.6 7.8  --   CREATININE 1.04  --  1.18 1.18  --   --   --   --  0.94  VANCOTROUGH  --  47*  --   --   --   --   --   --  10  VANCORANDOM  --   --   --   --  13  --   --   --   --     Estimated Creatinine Clearance: 77.3 mL/min (by C-G formula based on Cr of 0.94).    Allergies  Allergen Reactions  . Penicillins Hives and Rash    Has patient had a PCN reaction causing immediate rash, facial/tongue/throat swelling, SOB or lightheadedness with hypotension: Yes Has patient had a PCN reaction causing severe rash  involving mucus membranes or skin necrosis: Yes   Has patient had a PCN reaction that required hospitalization No Has patient had a PCN reaction occurring within the last 10 years: No If all of the above answers are "NO", then may proceed with Cephalosporin use.     Antimicrobials this admission: Azactam 2/18 >>2/19 Rocephin 2/19 >> 2/20 Vancomycin 2/18 >> Rifampin PO 2/19 >>  Dose adjustments this admission: 2/21: VT = 7 on 1gm q12h, dose increased to 1gm q8h 2/22: VT = 14 on 1gm q8h, dose increased to  q8h 2/25: VT = 25 (1hr early) on 1250 q8h, dose changed to  q12h 2/28: VT = 18 (on time) on 1500 mg q12h 3/6: VT = 47 1500 mg dose given after level drawn 3/8: VR = 13 (with no dose x ~41 hours)  3/11 VT= 10 on 1 gram Q 24H on 1 gram Q24H and SCr 0.9  Microbiology results: 2/18 blood x 2 - MRSA (S: Clinda, Rifampin, TCN, Vanc, MIC = 1, R: Septra, CIpro) 2/18 R leg wound - abundant MRSA 2/19 R leg wound - MRSA 2/19 urine -100k/ml MRSA 2/19 blood x 2 - 1/2 MRSA  2/19 R leg wound, anaerobic x 2 - no anaerobes isolated 2/19 MRSA PCR- POS -> CHG/Bactroban 2/19-20>>2/24 2/20 Blood x 2 - negative F 2/24 left scapula abscess - negative 2/28 c diff neg 3/3 BC x2 >>ngtd 3/6 BCx2>>ngtd   Thank you for allowing us to participate in this patients care.  Pollyann SamplesAndy Prisha Hiley, PharmD, BCPS 08/28/2015, 4:46 PM Pager: [phone]

## 2015-08-28 NOTE — Progress Notes (Signed)
Patients son Jeff Ferrell (traveled from [place]) at the bedside. Son is upset and states that [facility] has a vendetta against his father. Patient had hand mittens on when the family arrived to the bedside and family states that we placed restraints on patient. RN explained to the family that the mittens were not restrictive to the patients fingers and that the mittens were not classified restraints because they were not tied to the bed frame. RN also explained to the family that there was an occurrence on day shift that led the day shift RN to place the mittens on the patient. Family still insists that the mittens are restraints and the son removed them. Son wanted to know why the patients water pitcher was on the counter and not by the patients bedside. RN explained to the son that the patient refuses to swallow any pills that she offered on tonight and that the patient refused water as well. RN was informed by the day shift RN that the patient was eating minimal to no food during meals throughout the day. Son states that we are oversedating his father with pain medication. Son wanted to see the MD at the bedside immediately on tonight because he said that it is time for his father to leave right now. RN paged NP, Claiborne Billingsallahan and was informed [facility] and that he would not be available to come to the bedside because of this reason. NP advised RN to call in house MD to come to the floor. RN notified Burnadette PeterLynch and was informed that in keeping with the best interest of the patient it would be recommended to wait until the attending could come to the bedside in the morning. RN informed patients son of the plan that the MD would be at the bedside in the morning to address all concerns of the family. Son is pacing the floor and asking several questions of the RN about the care of that the patient has received for the past several weeks. RN has apologized to the family and has also given the son the  phone number of Patient Experience so that they can receive further assistance if needed. RN and NT are going into the patients room throughout the night together to make rounds and reposition the patient as needed. Nursing will continue to monitor.

## 2015-08-28 NOTE — Progress Notes (Signed)
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 At that time, also noted to have a urinary tract infection. Patient seen by orthopedic surgery and underwent irrigation and debridement of hip on 2/19. Cultures as well as blood cultures positive for MRSA. Infectious disease consulted. Patient has been on vancomycin since. Cardiology consulted for TEE which was negative as were repeat blood cultures.    On 2/24, patient underwent aspiration of left chest wall. Plan was for long-term IV antibiotics with PICC line placed on 2/28. 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 He started spiking fevers on 3/3 and infectious disease reconsulted.  Other complications during hospitalization include severe urinary retention requiring Foley catheter, blood transfusions on 2/20 and 3/1 and most significantly, severe pain that has been extremely difficult to control or oversedation.  Palliative care consulted on 3/7.  D/w niece & sister & they highlight heavy beer drinking which would account for his falls & broken bones.    Had an extensive discussion with patient's son on 3/10 who lives in [place] and has not seen patient since admission. Patient's son initially angry thinking that we are trying to give him medication to keep him sedated. I assured him that is the case. Covered comprehensive history of the patient prior to this admission and including what  has been going on this admission. It is the son's goal to try to take the patient home with him on IV antibiotics at some point. On night of 3/10, patient's son came in from [place]. He was quite upset by nursing reports about seeing his father in his condition. Nurses informed patient's son that his father was not on restraints, and that hand as were done so that drinking stated he would not remove IV lines. Patient's son upset and left. Palliative care is going to try to have a goals of care meeting with the entire family early next week.  Patient today is awake and more lucid. He is just on the fentanyl patch alone. As mentioned above, the issue is that by him being more lucid, he is in severe pain. Patient states that he can barely move, his whole body is wrapped with pain and having spasms. He states that any attempts to touch him just lead to severe spasms. Says that he has no appetite and can only drink juice. We discussed his hospital course including his wound infection and looked that his wound VAC.  Assessment/Plan: Principal Problem:   Sepsis with MRSA polynephritis, infected right hip post fracture repair with secondary right gluteal abscess, left scapular phlegmon and left chest wall & intractable pain. Status post I&D 2. Currently on vancomycin and rifampin. Appreciate infectious disease help. Continues to remain afebrile, although had low-grade temperature of 100 earlier this morning. Repeat blood cultures have been negative. On wound VAC.  Appreciate Pall care help.  Cont fentanyl patch with prn fentanyl injections.  Will try baclofen for spasms. Active Problems:   UTI (lower urinary tract infection): Felt to  have been treated, given antibiotics.    DM type 2, goal HbA1c < 7% (HCC) poor by mouth intake. Blood sugars stable. On supplemental shakes. 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 Now normalized. Stable.    Acute urinary retention: Now resolved. Passed voiding  trial.  Heavy alcohol abuse: out of window for withdrawals.  This would account for his falls & multiple fractures  Code Status: Full code   Family Communication: Updated sister by phone today. Goals of care meeting by palliative care for the entire family early next week  Disposition Plan: Hopefully be determined early next week a goals of care meeting   Consultants:  Infectious Disease   IR  Orthopedics  Palliative Care  Cardiology  Procedures: 2/19 - I&D infected R hip wound  2/20 1 units packed red blood cells transfused 2/24 TEE EF= 60% to 65%. 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 with excisional debridement of the skin, muscle, bone 25 x 5 cm w/ incision and drainage of right gluteal abscess & partial secondary closure of previous surgical incision  Antibiotics: Aztreonam 2/18-2/19 Vancomycin 2/18-present Ceftriaxone 2/19-2/20 Rifampin 2/19- present   Objective: BP 98/62 mmHg  Pulse 105  Temp(Src) 100 F (37.8 C) (Oral)  Resp 18  Ht  (1.753 m)  Wt 73.1 kg (161 lb 2.5 oz)  BMI 23.79 kg/m2  SpO2 100%  Intake/Output Summary (Last 24 hours) at 08/28/15 1458 Last data filed at 08/28/15 0500  Gross per 24 hour  Intake      0 ml  Output   1000 ml  Net  -1000 ml   Filed Weights   08/09/15 0255  Weight: 73.1 kg (161 lb 2.5 oz)    Exam:   General:  Looks to be in pain  Cardiovascular: Regular rhythm, tachycardic  Respiratory: poor inspiratory effort  Abdomen: Soft, non-tender?, Minimal distention, hypoactive bowel sounds    Musculoskeletal:    emaciated , wound VAC over lateral aspect of right leg  Data Reviewed: Basic Metabolic Panel:  Recent Labs Lab 08/23/15 0540 08/24/15 1215  08/25/15 0500  NA 133* 135 137  K 4.6 4.8 4.3  CL 102 105 108  CO2 GLUCOSE 91 127* 124*  BUN CREATININE 1.04 1.18 1.18  CALCIUM 8.7* 8.7* 8.9   Liver Function Tests:  Recent Labs Lab 08/24/15 1215  AST 44*  ALT 17  ALKPHOS 172*  BILITOT 1.0  PROT 6.7  ALBUMIN 2.2*   No results for input(s): LIPASE, AMYLASE in the last 168 hours.  Recent Labs Lab 08/24/15 1456  AMMONIA 30   CBC:  Recent Labs Lab 08/24/15 1215 08/26/15 1145 08/27/15 0443 08/28/15 0552  WBC 12.1* 10.3 8.6 7.8  NEUTROABS 10.0*  --   --   --   HGB 8.5* 7.9* 8.1* 8.0*  HCT 26.2* 24.3* 26.5* 25.8*  MCV 80.1 80.2 79.6 79.6  PLT 374 370 407* 371   Cardiac Enzymes:   No results for input(s): CKTOTAL, CKMB, CKMBINDEX, TROPONINI in the last 168 hours. BNP (last 3 results)  Recent Labs  06/11/15 1106 07/02/15 0538  BNP 27.7 34.0    ProBNP (last 3 results) No results for input(s): PROBNP in the last 8760  hours.  CBG:  Recent Labs Lab 08/25/15 2111 08/26/15 0633 08/26/15 2154 08/27/15 2201 08/28/15 0629  GLUCAP 115* 123* 92 102* 121*    Recent Results (from the past 240 hour(s))  Culture, blood (Routine X 2) w Reflex to ID Panel     Status: None   Collection Time: 08/20/15 11:18 AM  Result Value Ref Range Status   Specimen Description BLOOD BLOOD RIGHT ARM  Final   Special Requests IN PEDIATRIC BOTTLE 3CC  Final   Culture NO GROWTH 5 DAYS  Final   Report Status 08/25/2015 FINAL  Final  Culture, blood (Routine X 2) w Reflex to ID Panel     Status: None   Collection Time: 08/20/15 11:22 AM  Result Value Ref Range Status   Specimen Description BLOOD BLOOD RIGHT HAND  Final   Special Requests IN PEDIATRIC BOTTLE 3CC  Final   Culture NO GROWTH 5 DAYS  Final   Report Status 08/25/2015 FINAL  Final  Culture, Urine     Status: None   Collection Time: 08/20/15 12:11 PM  Result Value Ref Range Status   Specimen Description URINE, CATHETERIZED  Final   Special  Requests NONE  Final   Culture NO GROWTH 1 DAY  Final   Report Status 08/21/2015 FINAL  Final  Culture, blood (routine x 2)     Status: None (Preliminary result)   Collection Time: 08/23/15  3:59 PM  Result Value Ref Range Status   Specimen Description BLOOD RIGHT HAND  Final   Special Requests IN PEDIATRIC BOTTLE 3CC  Final   Culture NO GROWTH 4 DAYS  Final   Report Status PENDING  Incomplete  Culture, blood (routine x 2)     Status: None (Preliminary result)   Collection Time: 08/23/15  3:59 PM  Result Value Ref Range Status   Specimen Description BLOOD RIGHT ARM  Final   Special Requests IN PEDIATRIC BOTTLE 3CC  Final   Culture NO GROWTH 4 DAYS  Final   Report Status PENDING  Incomplete     Studies: No results found.  Scheduled Meds: . sodium chloride   Intravenous Once  . aspirin EC  325 mg Oral Daily  . baclofen  10 mg Oral TID  . bisacodyl  10 mg Oral Daily  . diazepam  5 mg Oral BID  . feeding supplement (ENSURE ENLIVE)  237 mL Oral TID BM  . feeding supplement (PRO-STAT SUGAR FREE 64)  30 mL Oral BID  . fentaNYL  75 mcg Transdermal Q72H  . pregabalin  50 mg Oral BID  . rifampin  600 mg Oral Daily  . senna  2 tablet Oral Once  . sodium chloride flush  10-40 mL Intracatheter Q12H  . sodium phosphate  1 enema Rectal Once  . tamsulosin  0.4 mg Oral Daily  . vancomycin  1,000 mg Intravenous Q24H    Continuous Infusions: . sodium chloride 100 mL/hr at 08/27/15 0243     Time spent: 15 minutes  Hollice EspyKRISHNAN,Evely Gainey K  [facility] Pager [phone] . If 7PM-7AM, please contact night-coverage at [email or web address], password [email or web address] 08/28/2015, 2:58 PM  LOS: 21 days

## 2015-08-29 DIAGNOSIS — M62838 Other muscle spasm: Secondary | ICD-10-CM

## 2015-08-29 LAB — GLUCOSE, CAPILLARY
Glucose-Capillary: 150 mg/dL — ABNORMAL HIGH (ref 65–99)
Glucose-Capillary: 158 mg/dL — ABNORMAL HIGH (ref 65–99)
Glucose-Capillary: 161 mg/dL — ABNORMAL HIGH (ref 65–99)

## 2015-08-29 MED ORDER — BACLOFEN 10 MG PO TABS
20.0000 mg | ORAL_TABLET | Freq: Three times a day (TID) | ORAL | Status: DC
  Administered 2015-08-29 – 2015-09-03 (×7): 20 mg via ORAL
  Filled 2015-08-29 (×12): qty 2

## 2015-08-29 NOTE — Progress Notes (Signed)
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 At that time, also noted to have a urinary tract infection. Patient seen by orthopedic surgery and underwent irrigation and debridement of hip on 2/19. Cultures as well as blood cultures positive for MRSA. Infectious disease consulted. Patient has been on vancomycin since. Cardiology consulted for TEE which was negative as were repeat blood cultures.    On 2/24, patient underwent aspiration of left chest wall. Plan was for long-term IV antibiotics with PICC line placed on 2/28. 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 He started spiking fevers on 3/3 and infectious disease reconsulted.  Other complications during hospitalization include severe urinary retention requiring Foley catheter, blood transfusions on 2/20 and 3/1 and most significantly, severe pain that has been extremely difficult to control or oversedation.  Palliative care consulted on 3/7.  D/w niece & sister & they highlight heavy beer drinking which would account for his falls & broken bones.    Had an extensive discussion with patient's son on 3/10 who lives in [place] and has not seen patient since admission. Patient's son initially angry thinking that we are trying to give him medication to keep him sedated. I assured him that is the case. Covered comprehensive history of the patient prior to this admission and including what  has been going on this admission. It is the son's goal to try to take the patient home with him on IV antibiotics at some point. On night of 3/10, patient's son came in from [place]. He was quite upset by nursing reports about seeing his father in his condition. Nurses informed patient's son that his father was not on restraints, and that hand as were done so that drinking stated he would not remove IV lines. Patient's son upset and left. Palliative care is going to try to have a goals of care meeting with the entire family early next week.  For the last 2 days, patient has been more lucid. On fentanyl patch alone. Started baclofen on 3/11 and patient reports some improvement in his back spasms.  Assessment/Plan: Principal Problem:   Sepsis with MRSA polynephritis, infected right hip post fracture repair with secondary right gluteal abscess, left scapular phlegmon and left chest wall & intractable pain. Status post I&D 2. Currently on vancomycin and rifampin. Appreciate infectious disease help. Continues to remain afebrile, although has had an occasional low-grade temperature.. Repeat blood cultures have been negative. On wound VAC.  Appreciate Pall care help.  Cont fentanyl patch with prn fentanyl injections.  Some relief with baclofen, will increase dose.  Plan is to continue antibiotics until 4/1 and then reassess. Active Problems:   UTI (lower urinary tract infection): Felt to have been treated, given antibiotics.    DM type 2, goal HbA1c < 7% (HCC) poor by mouth intake. Blood sugars stable. On supplemental shakes. 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 Now normalized. Stable.    Acute urinary retention: Now resolved. Passed  voiding trial.  Heavy alcohol abuse: out of window for withdrawals.  This would account for his falls & multiple fractures  Code Status: Full code   Family Communication: Updated sister by phone on 3/11. Goals of care meeting by palliative care for  the entire family early next week  Disposition Plan: Hopefully be determined early next week a goals of care meeting   Consultants:  Infectious Disease   IR  Orthopedics  Palliative Care  Cardiology  Procedures: 2/19 - I&D infected R hip wound  2/20 1 units packed red blood cells transfused 2/24 TEE EF= 60% to 65%. 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 with excisional debridement of the skin, muscle, bone 25 x 5 cm w/ incision and drainage of right gluteal abscess & partial secondary closure of previous surgical incision  Antibiotics: Aztreonam 2/18-2/19 Vancomycin 2/18-present Ceftriaxone 2/19-2/20 Rifampin 2/19- present   Objective: BP 104/62 mmHg  Pulse 108  Temp(Src) 97.9 F (36.6 C) (Oral)  Resp 18  Ht  (1.753 m)  Wt 73.1 kg (161 lb 2.5 oz)  BMI 23.79 kg/m2  SpO2 100%  Intake/Output Summary (Last 24 hours) at 08/29/15 1159 Last data filed at 08/29/15 0611  Gross per 24 hour  Intake 2088.33 ml  Output    950 ml  Net 1138.33 ml   Filed Weights   08/09/15 0255  Weight: 73.1 kg (161 lb 2.5 oz)    Exam:   General:  Looks to be in pain  Cardiovascular: Regular rhythm, tachycardic  Respiratory: poor inspiratory effort  Abdomen: Soft, non-tender?, Minimal distention, hypoactive bowel sounds    Musculoskeletal:    emaciated , wound VAC over lateral aspect of right leg  Data Reviewed: Basic Metabolic Panel:  Recent Labs Lab 08/23/15 0540 08/24/15 1215 08/25/15 0500 08/28/15 1455  NA 133* 135 137 137  K 4.6 4.8 4.3 3.5  CL 102 105 108 103  CO2 GLUCOSE 91 127* 124* 111*  BUN CREATININE 1.04 1.18 1.18 0.94  CALCIUM 8.7* 8.7* 8.9 8.1*   Liver Function  Tests:  Recent Labs Lab 08/24/15 1215  AST 44*  ALT 17  ALKPHOS 172*  BILITOT 1.0  PROT 6.7  ALBUMIN 2.2*   No results for input(s): LIPASE, AMYLASE in the last 168 hours.  Recent Labs Lab 08/24/15 1456  AMMONIA 30   CBC:  Recent Labs Lab 08/24/15 1215 08/26/15 1145 08/27/15 0443 08/28/15 0552  WBC 12.1* 10.3 8.6 7.8  NEUTROABS 10.0*  --   --   --   HGB 8.5* 7.9* 8.1* 8.0*  HCT 26.2* 24.3* 26.5* 25.8*  MCV 80.1 80.2 79.6 79.6  PLT 374 370 407* 371   Cardiac Enzymes:   No results for input(s): CKTOTAL, CKMB, CKMBINDEX, TROPONINI in the last 168 hours. BNP (last 3 results)  Recent Labs  06/11/15 1106 07/02/15 0538  BNP 27.7 34.0    ProBNP (last 3 results) No results for input(s): PROBNP in the last 8760 hours.  CBG:  Recent Labs Lab 08/26/15 2154 08/27/15 2201 08/28/15 0629 08/28/15 2138 08/29/15 1149  GLUCAP 92 102* 121* 99 161*    Recent Results (from the past 240 hour(s))  Culture, blood (Routine X 2) w Reflex to ID  Panel     Status: None   Collection Time: 08/20/15 11:18 AM  Result Value Ref Range Status   Specimen Description BLOOD BLOOD RIGHT ARM  Final   Special Requests IN PEDIATRIC BOTTLE 3CC  Final   Culture NO GROWTH 5 DAYS  Final   Report Status 08/25/2015 FINAL  Final  Culture, blood (Routine X 2) w Reflex to ID Panel     Status: None   Collection Time: 08/20/15 11:22 AM  Result Value Ref Range Status   Specimen Description BLOOD BLOOD RIGHT HAND  Final   Special Requests IN PEDIATRIC BOTTLE 3CC  Final   Culture NO GROWTH 5 DAYS  Final   Report Status 08/25/2015 FINAL  Final  Culture, Urine     Status: None   Collection Time: 08/20/15 12:11 PM  Result Value Ref Range Status   Specimen Description URINE, CATHETERIZED  Final   Special Requests NONE  Final   Culture NO GROWTH 1 DAY  Final   Report Status 08/21/2015 FINAL  Final  Culture, blood (routine x 2)     Status: None   Collection Time: 08/23/15  3:59 PM  Result Value  Ref Range Status   Specimen Description BLOOD RIGHT HAND  Final   Special Requests IN PEDIATRIC BOTTLE 3CC  Final   Culture NO GROWTH 5 DAYS  Final   Report Status 08/28/2015 FINAL  Final  Culture, blood (routine x 2)     Status: None   Collection Time: 08/23/15  3:59 PM  Result Value Ref Range Status   Specimen Description BLOOD RIGHT ARM  Final   Special Requests IN PEDIATRIC BOTTLE 3CC  Final   Culture NO GROWTH 5 DAYS  Final   Report Status 08/28/2015 FINAL  Final     Studies: No results found.  Scheduled Meds: . aspirin EC  325 mg Oral Daily  . baclofen  20 mg Oral TID  . bisacodyl  10 mg Oral Daily  . diazepam  5 mg Oral BID  . feeding supplement (ENSURE ENLIVE)  237 mL Oral TID BM  . feeding supplement (PRO-STAT SUGAR FREE 64)  30 mL Oral BID  . fentaNYL  75 mcg Transdermal Q72H  . pregabalin  50 mg Oral BID  . rifampin  600 mg Oral Daily  . senna  2 tablet Oral Once  . sodium chloride flush  10-40 mL Intracatheter Q12H  . sodium phosphate  1 enema Rectal Once  . tamsulosin  0.4 mg Oral Daily  . vancomycin  1,000 mg Intravenous Q12H    Continuous Infusions: . sodium chloride 100 mL/hr at 08/29/15 [id number]     Time spent: 15 minutes  Hollice EspyKRISHNAN,Pegah Segel K  [facility] Pager [phone] . If 7PM-7AM, please contact night-coverage at [email or web address], password [email or web address] 08/29/2015, 11:59 AM  LOS: 22 days

## 2015-08-30 LAB — BASIC METABOLIC PANEL
Anion gap: 8 (ref 5–15)
BUN: 10 mg/dL (ref 6–20)
CALCIUM: 7.5 mg/dL — AB (ref 8.9–10.3)
CO2: 23 mmol/L (ref 22–32)
CREATININE: 1.02 mg/dL (ref 0.61–1.24)
Chloride: 109 mmol/L (ref 101–111)
GFR calc Af Amer: >60 mL/min (ref >60)
GLUCOSE: 147 mg/dL — AB (ref 65–99)
Potassium: 3.4 mmol/L — ABNORMAL LOW (ref 3.5–5.1)
Sodium: 140 mmol/L (ref 135–145)

## 2015-08-30 LAB — GLUCOSE, CAPILLARY: GLUCOSE-CAPILLARY: 112 mg/dL — AB (ref 65–99)

## 2015-08-30 MED ORDER — POLYETHYLENE GLYCOL 3350 17 G PO PACK
17.0000 g | PACK | Freq: Every day | ORAL | Status: DC
  Administered 2015-09-03: 17 g via ORAL
  Filled 2015-08-30 (×2): qty 1

## 2015-08-30 NOTE — Progress Notes (Signed)
Patient ID: Guinevere FerrariHarold Duffus, male   DOB: 02/08/1951, 65 y.o.   MRN: [id number]         [facility]    Date of Admission:  08/07/2015   Total days of antibiotics 23         He remains afebrile but overall he is doing poorly and his prognosis is extremely poor. I will continue vancomycin and rifampin while the Palliative Care Team attempts to address goals of care with his son.         Cliffton AstersJohn Surena Welge, MD [facility] [phone] pager   [phone] 06/22/2015, 1:32 PM

## 2015-08-30 NOTE — Progress Notes (Signed)
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 At that time, also noted to have a urinary tract infection. Patient seen by orthopedic surgery and underwent irrigation and debridement of hip on 2/19. Cultures as well as blood cultures positive for MRSA. Infectious disease consulted. Patient has been on vancomycin since. Cardiology consulted for TEE which was negative as were repeat blood cultures.    On 2/24, patient underwent aspiration of left chest wall. Plan was for long-term IV antibiotics with PICC line placed on 2/28. 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 He started spiking fevers on 3/3 and infectious disease reconsulted.  Other complications during hospitalization include severe urinary retention requiring Foley catheter, blood transfusions on 2/20 and 3/1 and most significantly, severe pain that has been extremely difficult to control or oversedation.  Palliative care consulted on 3/7.  D/w niece & sister & they highlight heavy beer drinking which would account for his falls & broken bones.    Had an extensive discussion with patient's son on 3/10 who lives in [place] and has not seen patient since admission. Patient's son initially angry thinking that we are trying to give him medication to keep him sedated. I assured him that is the case. Covered comprehensive history of the patient prior to this admission and including what  has been going on this admission. It is the son's goal to try to take the patient home with him on IV antibiotics at some point. On night of 3/10, patient's son came in from [place]. He was quite upset by nursing reports about seeing his father in his condition. Nurses informed patient's son that his father was not on restraints, and that hand as were done so that drinking stated he would not remove IV lines. Patient's son upset and left. Palliative care is going to try to have a goals of care meeting with the entire family early next week.  For the last few days, patient has been more lucid. On fentanyl patch alone. Started baclofen on 3/11 and titrating up which has given the patient some benefit. Today, little more withdrawn and slightly confused, repeats sentences often.  Assessment/Plan: Principal Problem:   Sepsis with MRSA polynephritis, infected right hip post fracture repair with secondary right gluteal abscess, left scapular phlegmon and left chest wall & intractable pain. Status post I&D 2. Currently on vancomycin and rifampin. Appreciate infectious disease help. Continues to remain afebrile, although has had an occasional low-grade temperature.. Repeat blood cultures have been negative. On wound VAC.  Appreciate Pall care help.  Cont fentanyl patch with prn fentanyl injections.  Continue baclofen at 20 mg by mouth 3 times a day.  Plan is to continue antibiotics until 4/1 and then reassess. Active Problems:   UTI (lower urinary tract infection): Felt to have been treated, given antibiotics.    DM type 2, goal HbA1c < 7% (HCC) poor by mouth intake. Blood sugars stable. On supplemental shakes. 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 Now normalized. Stable.    Acute urinary retention: Now resolved. Passed voiding trial.  Heavy alcohol abuse: out of window for withdrawals.  This would account for his falls & multiple fractures  Code Status: Full code   Family  Communication: Updated sister by phone on 3/11. Goals of care meeting by palliative care for the entire family early next week  Disposition Plan: Hopefully be determined early next week a goals of care meeting   Consultants:  Infectious Disease   IR  Orthopedics  Palliative Care  Cardiology  Procedures: 2/19 - I&D infected R hip wound  2/20 1 units packed red blood cells transfused 2/24 TEE EF= 60% to 65%. 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 with excisional debridement of the skin, muscle, bone 25 x 5 cm w/ incision and drainage of right gluteal abscess & partial secondary closure of previous surgical incision  Antibiotics: Aztreonam 2/18-2/19 Vancomycin 2/18-present Ceftriaxone 2/19-2/20 Rifampin 2/19- present   Objective: BP 105/63 mmHg  Pulse 100  Temp(Src) 98.4 F (36.9 C) (Axillary)  Resp 16  Ht  (1.753 m)  Wt 73.1 kg (161 lb 2.5 oz)  BMI 23.79 kg/m2  SpO2 100%  Intake/Output Summary (Last 24 hours) at 08/30/15 1043 Last data filed at 08/30/15 0700  Gross per 24 hour  Intake 2641.67 ml  Output    900 ml  Net 1741.67 ml   Filed Weights   08/09/15 0255  Weight: 73.1 kg (161 lb 2.5 oz)    Exam:   General:  Quiet, withdrawn. Cage no spasms  Cardiovascular: Regular rhythm, borderline tachycardia  Respiratory: poor inspiratory effort, otherwise clear  Abdomen: Soft, non-tender?, Minimal distention, hypoactive bowel sounds    Musculoskeletal:    emaciated , wound VAC over lateral aspect of right leg  Data Reviewed: Basic Metabolic Panel:  Recent Labs Lab 08/24/15 1215 08/25/15 0500 08/28/15 1455 08/30/15 0451  NA 135 137 137 140  K 4.8 4.3 3.5 3.4*  CL 105 108 103 109  CO2 GLUCOSE 127* 124* 111* 147*  BUN CREATININE 1.18 1.18 0.94 1.02  CALCIUM 8.7* 8.9 8.1* 7.5*   Liver Function Tests:  Recent Labs Lab 08/24/15 1215  AST 44*  ALT 17  ALKPHOS 172*  BILITOT 1.0  PROT 6.7  ALBUMIN 2.2*   No results for input(s): LIPASE, AMYLASE in the last 168 hours.  Recent Labs Lab 08/24/15 1456  AMMONIA 30   CBC:  Recent Labs Lab 08/24/15 1215 08/26/15 1145 08/27/15 0443 08/28/15 0552  WBC 12.1* 10.3 8.6 7.8  NEUTROABS 10.0*  --   --   --   HGB 8.5* 7.9* 8.1* 8.0*  HCT 26.2* 24.3* 26.5* 25.8*  MCV 80.1 80.2 79.6 79.6  PLT 374 370 407* 371   Cardiac Enzymes:   No results for input(s): CKTOTAL, CKMB, CKMBINDEX, TROPONINI in the last 168 hours. BNP (last 3 results)  Recent Labs  06/11/15 1106 07/02/15 0538  BNP 27.7 34.0    ProBNP (last 3 results) No results for input(s): PROBNP in the last 8760 hours.  CBG:  Recent Labs Lab 08/28/15 0629 08/28/15 2138 08/29/15 1149 08/29/15 1730 08/29/15 2336  GLUCAP 121* 99 161* 158* 150*  Recent Results (from the past 240 hour(s))  Culture, blood (Routine X 2) w Reflex to ID Panel     Status: None   Collection Time: 08/20/15 11:18 AM  Result Value Ref Range Status   Specimen Description BLOOD BLOOD RIGHT ARM  Final   Special Requests IN PEDIATRIC BOTTLE 3CC  Final   Culture NO GROWTH 5 DAYS  Final   Report Status 08/25/2015 FINAL  Final  Culture, blood (Routine X 2) w Reflex to ID Panel     Status: None   Collection Time: 08/20/15 11:22 AM  Result Value Ref Range Status   Specimen Description BLOOD BLOOD RIGHT HAND  Final   Special Requests IN PEDIATRIC BOTTLE 3CC  Final   Culture NO GROWTH 5 DAYS  Final   Report Status 08/25/2015 FINAL  Final  Culture, Urine     Status: None   Collection Time: 08/20/15 12:11 PM  Result Value Ref Range Status   Specimen Description URINE, CATHETERIZED  Final   Special Requests NONE  Final   Culture NO GROWTH 1 DAY  Final   Report  Status 08/21/2015 FINAL  Final  Culture, blood (routine x 2)     Status: None   Collection Time: 08/23/15  3:59 PM  Result Value Ref Range Status   Specimen Description BLOOD RIGHT HAND  Final   Special Requests IN PEDIATRIC BOTTLE 3CC  Final   Culture NO GROWTH 5 DAYS  Final   Report Status 08/28/2015 FINAL  Final  Culture, blood (routine x 2)     Status: None   Collection Time: 08/23/15  3:59 PM  Result Value Ref Range Status   Specimen Description BLOOD RIGHT ARM  Final   Special Requests IN PEDIATRIC BOTTLE 3CC  Final   Culture NO GROWTH 5 DAYS  Final   Report Status 08/28/2015 FINAL  Final     Studies: No results found.  Scheduled Meds: . aspirin EC  325 mg Oral Daily  . baclofen  20 mg Oral TID  . bisacodyl  10 mg Oral Daily  . diazepam  5 mg Oral BID  . feeding supplement (ENSURE ENLIVE)  237 mL Oral TID BM  . feeding supplement (PRO-STAT SUGAR FREE 64)  30 mL Oral BID  . fentaNYL  75 mcg Transdermal Q72H  . pregabalin  50 mg Oral BID  . rifampin  600 mg Oral Daily  . senna  2 tablet Oral Once  . sodium chloride flush  10-40 mL Intracatheter Q12H  . sodium phosphate  1 enema Rectal Once  . tamsulosin  0.4 mg Oral Daily  . vancomycin  1,000 mg Intravenous Q12H    Continuous Infusions: . sodium chloride 100 mL/hr at 08/30/15 0500     Time spent: 15 minutes  Hollice Espy  [facility] Pager [phone] . If 7PM-7AM, please contact night-coverage at [email or web address], password [facility] 08/30/2015, 10:43 AM  LOS: 23 days

## 2015-08-30 NOTE — Clinical Social Work Note (Signed)
CSW attempted to call patient's son Eino Farberhillip Kerwood [phone] to discuss discharge plan.  CSW was not able to leave a message, CSW to try at another time.  Ervin KnackEric R. Jairen Goldfarb, MSW, Theresia MajorsLCSWA [phone] 08/30/2015 3:52 PM

## 2015-08-30 NOTE — Progress Notes (Signed)
Daily Progress Note   Patient Name: Jeff Ferrell       Date: 08/30/2015 DOB: 21-Jul-1950  Age: 65 y.o. MRN#: [id number] Attending Physician: Hollice Espy, MD Primary Care Physician: [name], MD Admit Date: 08/07/2015  Reason for Consultation/Follow-up: Establishing goals of care and Pain control  Subjective: More alert, much less pain today, but still shows non-verbal signs of pain with minimal movement in the bed. He ate well for lunch. He continues to require a Foley for urinary retention has not been able to be without foley due to prostate obstruction and possible L-Spine involvement.   Last week patient decompensated with intractable pain requiring a Fentanyl PCA which has been successfuly transitioned to a duragesic patch. When I saw him on Friday I reduced his infusion from 75 to to transition to patch but he was threatening RN staff and me "come any closer and you are going to see blood" and shouting multiple profanities to the point where the Duragesic patch could not be placed - once he was settled down with Valium the patch was placed and a CT of his pelvis was ordered which was unable to be done  Due to his inability to cooperate in teh setting of severe pain and delirium. He had bilateral mitts on because nursing was afraid he would pull out his PICC line and I agreed that this was a high risk. I noted events that son was coming into town late in the evening, but unfortunately there was not a Palliative Provider available at 1AM when he arrived at bedside on 3/11 for the first time since his father was hospitalized in the past 6 months.  Length of Stay: 23 days  Current Medications: Scheduled Meds:  . aspirin EC  325 mg Oral Daily  . baclofen  20 mg Oral TID  .  bisacodyl  10 mg Oral Daily  . diazepam  5 mg Oral BID  . feeding supplement (ENSURE ENLIVE)  237 mL Oral TID BM  . feeding supplement (PRO-STAT SUGAR FREE 64)  30 mL Oral BID  . fentaNYL  75 mcg Transdermal Q72H  . pregabalin  50 mg Oral BID  . rifampin  600 mg Oral Daily  . sodium chloride flush  10-40 mL Intracatheter Q12H  . tamsulosin  0.4 mg Oral Daily  .  vancomycin  1,000 mg Intravenous Q12H    Continuous Infusions: . sodium chloride 100 mL/hr at 08/30/15 0500    PRN Meds: acetaminophen **OR** acetaminophen, alum & mag hydroxide-simeth, diazepam, fentaNYL (SUBLIMAZE) injection, naLOXone (NARCAN)  injection, ondansetron **OR** ondansetron (ZOFRAN) IV, sodium chloride flush  Physical Exam: Physical Exam              Vital Signs: BP 105/63 mmHg  Pulse 100  Temp(Src) 98.4 F (36.9 C) (Axillary)  Resp 16  Ht 5\' 9"  (1.753 m)  Wt 73.1 kg (161 lb 2.5 oz)  BMI 23.79 kg/m2  SpO2 100% SpO2: SpO2: 100 % O2 Device: O2 Device: Not Delivered O2 Flow Rate: O2 Flow Rate (L/min): 2 L/min  Intake/output summary:  Intake/Output Summary (Last 24 hours) at 08/30/15 1340 Last data filed at 08/30/15 0700  Gross per 24 hour  Intake 2641.67 ml  Output    900 ml  Net 1741.67 ml   LBM: Last BM Date: 08/25/15 Baseline Weight: Weight: 73.1 kg (161 lb 2.5 oz) Most recent weight: Weight: 73.1 kg (161 lb 2.5 oz)       Palliative Assessment/Data: Flowsheet Rows        Most Recent Value   Intake Tab    Referral Department  Hospitalist   Unit at Time of Referral  Orthopedic Unit   Palliative Care Primary Diagnosis  Pain   Date Notified  08/23/15   Palliative Care Type  New Palliative care   Reason for referral  Pain   Date of Admission  08/07/15   Date first seen by Palliative Care  08/24/15   # of days Palliative referral response time  1 Day(s)   # of days IP prior to Palliative referral  16   Clinical Assessment    Palliative Performance Scale Score  20%   Pain Max last 24 hours   10   Pain Min Last 24 hours  10   Dyspnea Max Last 24 Hours  0   Dyspnea Min Last 24 hours  0   Nausea Max Last 24 Hours  0   Nausea Min Last 24 Hours  0   Anxiety Max Last 24 Hours  0   Anxiety Min Last 24 Hours  0   Psychosocial & Spiritual Assessment    Palliative Care Outcomes       Additional Data Reviewed: CBC    Component Value Date/Time   WBC 7.8 08/28/2015 0552   RBC 3.24* 08/28/2015 0552   HGB 8.0* 08/28/2015 0552   HCT 25.8* 08/28/2015 0552   PLT 371 08/28/2015 0552   MCV 79.6 08/28/2015 0552   MCH 24.7* 08/28/2015 0552   MCHC 31.0 08/28/2015 0552   RDW 15.2 08/28/2015 0552   LYMPHSABS 1.2 08/24/2015 1215   MONOABS 0.9 08/24/2015 1215   EOSABS 0.1 08/24/2015 1215   BASOSABS 0.0 08/24/2015 1215    CMP     Component Value Date/Time   NA 140 08/30/2015 0451   K 3.4* 08/30/2015 0451   CL 109 08/30/2015 0451   CO2 23 08/30/2015 0451   GLUCOSE 147* 08/30/2015 0451   BUN 10 08/30/2015 0451   CREATININE 1.02 08/30/2015 0451   CALCIUM 7.5* 08/30/2015 0451   PROT 6.7 08/24/2015 1215   ALBUMIN 2.2* 08/24/2015 1215   AST 44* 08/24/2015 1215   ALT 17 08/24/2015 1215   ALKPHOS 172* 08/24/2015 1215   BILITOT 1.0 08/24/2015 1215   GFRNONAA >60 08/30/2015 0451   GFRAA >60 08/30/2015 0451  Problem List:  Patient Active Problem List   Diagnosis Date Noted  . ETOH abuse 08/26/2015  . Constipation, acute 08/26/2015  . 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 Intractable pain 08/26/2015  . Encephalopathy acute   . Fever 08/23/2015  . Diabetic foot ulcer (HCC)   . Pleural effusion   . Gluteal abscess   . Chest wall abscess   . Low back pain   . Muscle spasms of both lower extremities   . Controlled diabetes mellitus type 2 with complications (HCC)   . Hypokalemia   . Acute pyelonephritis   . Sepsis due to methicillin resistant Staphylococcus aureus (MRSA) (HCC)   . Uncontrolled type 2 diabetes mellitus with complication (HCC)   . Acute  urinary retention   . Hip fracture requiring operative repair (HCC)   . MRSA bacteremia   . Staphylococcus aureus bacteremia with sepsis (HCC)   . Osteomyelitis of right hip (HCC)   . Hardware complicating wound infection (HCC)   . Infection 08/08/2015  . Sepsis (HCC) 08/07/2015  . DM type 2, goal HbA1c < 7% (HCC) 08/07/2015  . Chronic hyponatremia 08/07/2015  . Fracture of femoral neck, right, closed 07/19/2015  . Femoral neck fracture, right, closed, initial encounter 07/19/2015  . Protein-calorie malnutrition, severe 07/19/2015  . Leg weakness, bilateral 07/02/2015  . Left leg swelling 07/02/2015  . Left-sided chest wall pain   . Acute blood loss anemia 06/21/2015  . Hyponatremia 06/20/2015  . Femur fracture, left (HCC) 06/17/2015  . Discitis of lumbar region   . Left hip pain   . Hypoglycemia 06/16/2015  . UTI (lower urinary tract infection) 06/11/2015  . Back pain 06/11/2015  . Malnutrition of moderate degree 03/30/2015  . Diabetes mellitus (HCC) 03/29/2015     Palliative Care Assessment & Plan    1.Code Status:  Full code- Patient able to tell me he wants Aneta Mins to make decision for him -it is clear however that Mr. Insalaco does not currently have capacity for decision making- repeats himself- unable to answer much more than yes or no questions. Based on events over the weekend I doubt this family will be in a trusting place to discuss goals of care or code status.  2. Goals of Care/Additional Recommendations:  Called and attempted to give an update to his son Aundrey Elahi spoke with him very briefly and then he had to get off the phone with me abruptly to go to work. He asked me to call him early in the AM when he could talk. He only wanted to know how much pain medication his father was getting- I asked him to trust me that this being managed well and that any sedation he saw was needed due to intense suffering witnessed and documented by the entire medical team.  He ended the call before really any discussion could occur. I will attempt another phone call in the AM.   Patient has probably improved enough based on my evaluation today to go to SNF- I planned on asking his son if he wanted him placed in a SNF closer to him in [place] which would be reasonable since his father will likely need this level of care long term. Even though he has been estranged for many years he appears to want to take an active role in his fathers care at this point,   No changes to his current pain and muscle spasm regimen he seems to be tolerating it very well.  I think he will need  the Foley long term until he can have a urology evaluation- the retention issues are chronic and will reoccur if it is removed as it has multiple times in the past.    I continue to not see any stool documented. He will face major issues unless this is treated aggressively. Discussed with nursing.    3. Symptom Management: Duragesic 75mcg Baclofen TID Diazepm 5 BID  4. Palliative Prophylaxis:   Aspiration, Bowel Regimen, Delirium Protocol and Frequent Pain Assessment  5. Prognosis: Unable to determine  6. Discharge Planning:  Skilled Nursing Facility for rehab with Palliative care service follow-up   Care plan was discussed with patient son, patient, RN  Thank you for allowing the Palliative Medicine Team to assist in the care of this patient.   Time In: 1PM Time Out: 2PM Total Time 60 minutes Prolonged Time Billed Yes        Edsel PetrinElizabeth L Aidel Davisson, DO  08/30/2015, 1:40 PM  Please contact Palliative Medicine Team phone at [phone] for questions and concerns.

## 2015-08-30 NOTE — Progress Notes (Signed)
PT Cancellation Note  Patient Details Name: Jeff FerrariHarold Gibeau MRN: [id number] DOB: 10/14/1950   Cancelled Treatment:    Reason Eval/Treat Not Completed: Fatigue/lethargy limiting ability to participate Pt too lethargic to participate in therapy at this time. PT with continue to follow.    Derek MoundKellyn R Douglas Rooks [name], PTA Pager: [phone]   08/30/2015, 4:45 PM

## 2015-08-31 LAB — URINALYSIS, ROUTINE W REFLEX MICROSCOPIC
Glucose, UA: NEGATIVE mg/dL
Hgb urine dipstick: NEGATIVE
KETONES UR: NEGATIVE mg/dL
NITRITE: NEGATIVE
Protein, ur: 30 mg/dL — AB
Specific Gravity, Urine: 1.016 (ref 1.005–1.030)
pH: 6 (ref 5.0–8.0)

## 2015-08-31 LAB — URINE MICROSCOPIC-ADD ON

## 2015-08-31 LAB — GLUCOSE, CAPILLARY
GLUCOSE-CAPILLARY: 104 mg/dL — AB (ref 65–99)
GLUCOSE-CAPILLARY: 117 mg/dL — AB (ref 65–99)
Glucose-Capillary: 97 mg/dL (ref 65–99)

## 2015-08-31 MED ORDER — DIAZEPAM 5 MG/ML IJ SOLN
5.0000 mg | Freq: Once | INTRAMUSCULAR | Status: AC
  Administered 2015-08-31: 5 mg via INTRAVENOUS
  Filled 2015-08-31: qty 2

## 2015-08-31 NOTE — Progress Notes (Addendum)
Spoke with son, conversation was difficult. He is angry and confrontational citing the condition he found his father in on 3/11. He is focused on his fathers food and water intake only- difficulty understanding or seeing the big picture- he believes this hospital is the reason for his fathers weight loss -his father sent a picture on himself to his son on 2/11. I explained that it is much more likely that his weight loss is from his invasive wide spread, life threatening infection, self neglect, continued ETOH, multiple rib fractures, bilateral hip fractures, intractable pain, severe recurrent urinary retention and infection, failed back syndrome, delirium and intractable pain. I expressed that we most certainly have not done this to his father and tried to provide reasonable and rational explanation for his fathers condition. I offered to transfer to a facility in [place] so he could be closer to him and he could care for him -since the patient's sister and niece who have been caring for him here in [place] have different feelings about how his father has been doing and the kind of care he should receive. Patient's son has agreed to meet me at 10AM on 3/15- will try to get sister and niece here as well. I inquired about his other son Jeff Ferrell- but was given no information other than he is also in [place]. Complicated situation with a likely very poor outcome. Legally his sons are his surrogate decision makers-and Jeff Ferrell is available by phone except between 2-11PM when he is at work.  Patient is again confused and agitated. Required Mitts so he would not pull out his foley. Consider looking into source of urethral discharge documented by nursing- may have fungal infection among other atypical infections.   Time: 8AM-8:35AM Total Time: 35 minutes  Greater than 50%  of this time was spent counseling and coordinating care related to the above assessment and plan.  Jeff MaltaElizabeth Ferrell Armond, DO Palliative  Medicine [phone]

## 2015-08-31 NOTE — Progress Notes (Signed)
Pharmacy Antibiotic Note Jeff FerrariHarold Ferrell is a 65 y.o. male admitted on 08/07/2015 with MRSA bacteremia, R hip osteo, and multiple abscesses concerning for infection.  Now day #24 of abx for MRSA bacteremia w/ R hip osteo w/ hardware (s/p I&D 2/19; hip was repaired on 2/1). TEE neg for vegetations. Has had repeated I&D's with last one on 3/1.   Per ID: continue IV vancomycin and rifampin x 8 weeks postop (thru 09/18/15) then will need 10 months plus of oral suppressive abx.   Pt developed some worsening renal function along with a decline in UOP and subsequent Vanc trough was higher than desired on 3/6. Doses were held and vancomycin was restarted at lower dose of 1 gram Q24H on 3/8 with corresponding Vanc trough of 10 mcg/ml on 3/11. Dose adjusted to Vanc 1gm IV q12hrs with 3/12 4am dose. Creatinine with slight trend up 3/11 -> 3/13.  I/O positive. Goals of care meeting planned for 09/01/15.  Plan:  Continue vancomycin 1 gram IV q12hrs.  Continue rifampin 600mg  PO daily  Will plan to check Vanc trough level and bmet on 09/02/15.  Follow renal function, progress, plans.  Vanc trough weekly when stable.  Target Vanc troughs 15-20 mcg/ml    Height: 5\' 9"  (175.3 cm) Weight: 161 lb 2.5 oz (73.1 kg) IBW/kg (Calculated) : 70.7  Temp (24hrs), Avg:99 F (37.2 C), Min:98.1 F (36.7 C), Max:100 F (37.8 C)   Recent Labs Lab 08/25/15 0500 08/25/15 0520 08/26/15 1145 08/27/15 0443 08/28/15 0552 08/28/15 1455 08/30/15 0451  WBC  --   --  10.3 8.6 7.8  --   --   CREATININE 1.18  --   --   --   --  0.94 1.02  VANCOTROUGH  --   --   --   --   --  10  --   VANCORANDOM  --  13  --   --   --   --   --     Estimated Creatinine Clearance: 71.2 mL/min (by C-G formula based on Cr of 1.02).    Allergies  Allergen Reactions  . Penicillins Hives and Rash    Has patient had a PCN reaction causing immediate rash, facial/tongue/throat swelling, SOB or lightheadedness with hypotension: Yes Has patient  had a PCN reaction causing severe rash involving mucus membranes or skin necrosis: Yes   Has patient had a PCN reaction that required hospitalization No Has patient had a PCN reaction occurring within the last 10 years: No If all of the above answers are "NO", then may proceed with Cephalosporin use.     Antimicrobials this admission: Azactam 2/18 >>2/19 Rocephin 2/19 >> 2/20 Vancomycin 2/18 >> Rifampin PO 2/19 >>  Dose adjustments this admission: 2/21: VT = 7 on 1gm q12h, dose increased to 1gm q8h 2/22: VT = 14 on 1gm q8h, dose increased to 1250mg  q8h 2/25: VT = 25 (1hr early) on 1250 q8h, dose changed to 1500mg  q12h 2/28: VT = 18 (on time) on 1500 mg q12h 3/6: VT = 47 1500 mg dose given after level drawn 3/8: VR = 13 (with no dose x ~41 hours)  3/11 VT= 10 on 1 gram Q 24H and SCr 0.9; dose changed to 1 gm IV q12hrs  Microbiology results: 2/18 blood x 2 - MRSA (S: Clinda, Rifampin, TCN, Vanc, MIC = 1, R: Septra, CIpro) 2/18 R leg wound - abundant MRSA 2/19 R leg wound - MRSA 2/19 urine -100k/ml MRSA 2/19 blood x 2 - 1/2  MRSA 2/19 R leg wound, anaerobic x 2 - no anaerobes isolated 2/19 MRSA PCR- POS -> CHG/Bactroban 2/19-20>>2/24 2/20 Blood x 2 - negative F 2/24 left scapula abscess - negative 2/28 C diff neg 3/3 blood x 2 - neg 3/3 urine - neg 3/6 blood x 2 - neg 3/14 urine -  [name], [place] Pager: [phone] 08/31/2015 5:41 PM

## 2015-08-31 NOTE — Progress Notes (Signed)
Nutrition Follow-up  DOCUMENTATION CODES:   Not applicable  INTERVENTION:  Continue Ensure Enlive po TID, each supplement provides 350 kcal and 20 grams of protein.  Continue 30 ml Prostat po BID, each supplement provides 100 kcal and 15 grams of protein.   Encourage adequate PO intake.   NUTRITION DIAGNOSIS:   Inadequate oral intake related to dysphagia as evidenced by meal completion < 25%; ongoing  GOAL:   Patient will meet greater than or equal to 90% of their needs; progressing  MONITOR:   PO intake, Supplement acceptance, Weight trends, Labs, I & O's, Skin  REASON FOR ASSESSMENT:   Low Braden    ASSESSMENT:   71M with DM type 2 here with a surgical right hip infection s/p hip reair on 2/1 and MRSA bacteremia.  He presented 2/18 with drainage from the incision site and was found to have MRSA bacteremia.  Meal completion has been 25-75%. Pt reports hunger during time of visit. Noted a bottle of opened Ensure at bedside. Pt reports no difficulties with the nutritional supplements ordered. Pt encouraged to eat his food at meals and to drink his supplements. Pt was agitated during time of visit and RD was only able to observe/feel the clavicle/shoulder/arn region on pt's body which revealed no significant fat or muscle mass loss. Lumbar region and lower extremities were not observed due to agitation at visit. Noted palliative care to meet with patient and family to discuss goals of care.  Labs and medications reviewed.   Diet Order:  Diet Carb Modified Fluid consistency:: Thin; Room service appropriate?: Yes  Skin:  Wound (see comment) (DTI on knee, incision on R hip wih wound VAC)  Last BM:  3/13  Height:   Ht Readings from Last 1 Encounters:  08/09/15 5\' 9"  (1.753 m)    Weight:   Wt Readings from Last 1 Encounters:  08/09/15 161 lb 2.5 oz (73.1 kg)    Ideal Body Weight:  72.7 kg  BMI:  Body mass index is 23.79 kg/(m^2).  Estimated Nutritional Needs:    Kcal:  2100-2300  Protein:  100-110 grams  Fluid:  2.1 - 2.3L/day  EDUCATION NEEDS:   No education needs identified at this time  Roslyn SmilingStephanie Juluis Fitzsimmons, MS, RD, LDN Pager # [phone] After hours/ weekend pager # [phone]

## 2015-08-31 NOTE — Progress Notes (Signed)
Patient ID: Jeff Ferrell, male   DOB: 03/21/1951, 65 y.o.   MRN: [id number]         [facility]    Date of Admission:  08/07/2015   Total days of antibiotics 24         He is sitting up in bed today. He appears more alert and a little more comfortable although he does continue to wince intermittently in pain. I recommend continuing vancomycin and rifampin for 18 more days through 09/18/2015 then changing to longer-term suppressive therapy with cephalexin 500 mg 3 times daily given that he has deep infection of his right hip surrounding hardware. I will sign off now.         Cliffton AstersJohn Neilani Duffee, MD [facility] [phone] pager   [phone] cell 06/22/2015, 1:32 PM

## 2015-08-31 NOTE — Progress Notes (Signed)
Physical Therapy Treatment Patient Details Name: Jeff Ferrell MRN: [id number] DOB: 04/09/1951 Today's Date: 08/31/2015    History of Present Illness Pt is a 65 y/o M w/ recent fx of Rt femoral neck w/ repair on 07/21/15 who presents w/ wound infection, now s/p I&D.  On 3/1 pt underwent repeat I&D Rt hip and had a Rt gluteal abscess drained.PT w/ MRSA UTI and tachycardia.  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    PT Comments    Patient is not progressing toward PT goals and continues to refuse any mobility and is easily agitated with attempts of mobility. Pt was disoriented X 4 this session and therapist continues to only be able to work on positioning of pt to decrease risk of further skin breakdown. PT is signing off at this time due to pt's current status and continued refusal of mobility. Please order PT evaluation if pt becomes more appropriate for PT services.   Follow Up Recommendations        Equipment Recommendations       Recommendations for Other Services       Precautions / Restrictions Precautions Precautions: Fall Precaution Comments: Wound vac; Bil LE extreme spasms with bil knee flexion maintained throughout, R>L Restrictions Weight Bearing Restrictions: Yes RLE Weight Bearing: Weight bearing as tolerated LLE Weight Bearing: Weight bearing as tolerated    Mobility  Bed Mobility Overal bed mobility: Needs Assistance;+2 for physical assistance             General bed mobility comments: attempted to sit EOB this session but pt screaming out no and not following commands; pt grabbed and squeezed therapist's arm when trying to reposition bilat LE; total A +2 to reposition and scoot up in bed with use of bed pad; worked on repositioning to decrease risk of further skin breakdown  Transfers                    Ambulation/Gait                 Radiographer, therapeutictairs            Wheelchair Mobility     Modified Rankin (Stroke Patients Only)       Balance                                    Cognition Arousal/Alertness: Awake/alert Behavior During Therapy: Agitated;Flat affect Overall Cognitive Status: Impaired/Different from baseline Area of Impairment: Orientation;Attention;Memory;Following commands;Safety/judgement;Awareness Orientation Level: Disoriented to;Person;Place;Time;Situation Current Attention Level: Alternating Memory: Decreased short-term memory     Awareness: Intellectual   General Comments: Pt disoriented and repeating his first and last name and repeating "ok, listen"; pt responded to therapsits question 25% time but responding inappropriately for the most part    Exercises      General Comments        Pertinent Vitals/Pain Pain Assessment: Faces Faces Pain Scale: Hurts whole lot Pain Location: bilat LE  Pain Descriptors / Indicators: Grimacing;Guarding;Moaning;Spasm Pain Intervention(s): Limited activity within patient's tolerance;Monitored during session;Premedicated before session;Repositioned    Home Living                      Prior Function            PT Goals (current goals can now be found in the care plan section) Acute Rehab PT Goals Patient  Stated Goal: none stated Progress towards PT goals: Not progressing toward goals - comment    Frequency       PT Plan Frequency needs to be updated    Co-evaluation             End of Session   Activity Tolerance: Patient limited by pain;Treatment limited secondary to medical complications (Comment) (disoriented) Patient left: in bed;with call bell/phone within reach;with bed alarm set     Time: 1610-9604 PT Time Calculation (min) (ACUTE ONLY): 16 min  Charges:  $Therapeutic Activity: 8-22 mins                    G Codes:      [name], PTA Pager: [phone]   08/31/2015, 5:08 PM

## 2015-08-31 NOTE — Progress Notes (Signed)
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 At that time, also noted to have a urinary tract infection. Patient seen by orthopedic surgery and underwent irrigation and debridement of hip on 2/19. Cultures as well as blood cultures positive for MRSA. Infectious disease consulted. Patient has been on vancomycin since. Cardiology consulted for TEE which was negative as were repeat blood cultures.    On 2/24, patient underwent aspiration of left chest wall. Plan was for long-term IV antibiotics with PICC line placed on 2/28. 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 He started spiking fevers on 3/3 and infectious disease reconsulted.  Other complications during hospitalization include severe urinary retention requiring Foley catheter, blood transfusions on 2/20 and 3/1 and most significantly, severe pain that has been extremely difficult to control or oversedation.  Palliative care consulted on 3/7.  D/w niece & sister & they highlight heavy beer drinking which would account for his falls & broken bones.    Had an extensive discussion with patient's son on 3/10 who lives in [place] and has not seen patient since admission. Patient's son initially angry thinking that we are trying to give him medication to keep him sedated. I assured him that is the case. Covered comprehensive history of the patient prior to this admission and including what  has been going on this admission. It is the son's goal to try to take the patient home with him on IV antibiotics at some point. On night of 3/10, patient's son came in from [place]. He was quite upset by nursing reports about seeing his father in his condition. Nurses informed patient's son that his father was not on restraints, and that hand as were done so that drinking stated he would not remove IV lines. Patient's son upset and left.   For the last few days, patient was more lucid on fentanyl patch alone 7 responded well to starting of baclofen on 3/11 for spasms.   However, more confused overnight on 3/13, trying to self remove Foley.   Palliative care has been able to set up a potential for family meeting on 3/15 for goals of care and long-term plan.  Assessment/Plan: Principal Problem:   Sepsis with MRSA polynephritis, infected right hip post fracture repair with secondary right gluteal abscess, left scapular phlegmon and left chest wall & intractable pain. Status post I&D 2. Currently on vancomycin and rifampin. Appreciate infectious disease help. Continues to remain afebrile, although has had an occasional low-grade temperature.. Repeat blood cultures have been negative. On wound VAC.  Appreciate Pall care help.  Cont fentanyl patch with prn fentanyl injections.  Continue baclofen at 20 mg by mouth 3 times a day.  Plan is to continue antibiotics until 4/1 and then reassess. Active Problems:   UTI (lower urinary tract infection): Felt to have been treated, given antibiotics.    DM type 2, goal HbA1c < 7% (HCC) poor by mouth intake. Blood sugars stable. On supplemental shakes. 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 Now normalized. Stable.    Acute urinary retention: Now resolved. Passed voiding trial.   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  Heavy alcohol abuse: out of window for withdrawals.  This would account for his falls & multiple fractures  Code Status: Full code   Family Communication: Updated sister by phone on 3/11. Goals of care meeting by palliative care for the entire family  Plan for tomorrow  Disposition Plan: Hopefully be determined  By tomorrow's meeting   Consultants:  Infectious Disease  IR  Orthopedics  Palliative Care  Cardiology  Procedures: 2/19 - I&D infected R hip wound  2/20 1 units packed red blood cells transfused 2/24 TEE EF= 60% to 65%. 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 with excisional debridement of the skin, muscle, bone 25 x 5 cm w/ incision and drainage of right gluteal abscess & partial secondary closure of previous surgical incision  Antibiotics: Aztreonam 2/18-2/19 Vancomycin 2/18-present Ceftriaxone 2/19-2/20 Rifampin 2/19- present   Objective: BP 106/77 mmHg  Pulse 110  Temp(Src) 98.1 F (36.7 C) (Oral)  Resp 18  Ht 5\' 9"  (1.753 m)  Wt 73.1 kg (161 lb 2.5 oz)  BMI 23.79 kg/m2  SpO2 99%  Intake/Output Summary (Last 24 hours) at 08/31/15 1555 Last data filed at 08/31/15 1318  Gross per 24 hour  Intake 3293.33 ml  Output    100 ml  Net 3193.33 ml   Filed Weights   08/09/15 0255  Weight: 73.1 kg (161 lb 2.5 oz)    Exam:   General:   More confused today  Cardiovascular: Regular rhythm, borderline tachycardia  Respiratory:  Appears clear, but poor inspiratory effort  Abdomen: Soft, non-tender?, Minimal distention, hypoactive bowel sounds    Musculoskeletal:    emaciated , wound VAC over lateral aspect of right leg  Data Reviewed: Basic Metabolic  Panel:  Recent Labs Lab 08/25/15 0500 08/28/15 1455 08/30/15 0451  NA 137 137 140  K 4.3 3.5 3.4*  CL 108 103 109  CO2 22 25 23   GLUCOSE 124* 111* 147*  BUN 20 9 10   CREATININE 1.18 0.94 1.02  CALCIUM 8.9 8.1* 7.5*   Liver Function Tests: No results for input(s): AST, ALT, ALKPHOS, BILITOT, PROT, ALBUMIN in the last 168 hours. No results for input(s): LIPASE, AMYLASE in the last 168 hours. No results for input(s): AMMONIA in the last 168 hours. CBC:  Recent Labs Lab 08/26/15 1145 08/27/15 0443 08/28/15 0552  WBC 10.3 8.6 7.8  HGB 7.9* 8.1* 8.0*  HCT 24.3* 26.5* 25.8*  MCV 80.2 79.6 79.6  PLT 370 407* 371   Cardiac Enzymes:   No results for input(s): CKTOTAL, CKMB, CKMBINDEX, TROPONINI in the last 168 hours. BNP (last 3 results)  Recent Labs  06/11/15 1106 07/02/15 0538  BNP 27.7 34.0    ProBNP (last 3 results) No results for input(s): PROBNP in the last 8760 hours.  CBG:  Recent Labs Lab 08/29/15 1730 08/29/15 2336 08/30/15 1218 08/30/15 2257 08/31/15 0658  GLUCAP 158* 150* 112* 104* 117*    Recent Results (from the past 240 hour(s))  Culture, blood (routine  x 2)     Status: None   Collection Time: 08/23/15  3:59 PM  Result Value Ref Range Status   Specimen Description BLOOD RIGHT HAND  Final   Special Requests IN PEDIATRIC BOTTLE 3CC  Final   Culture NO GROWTH 5 DAYS  Final   Report Status 08/28/2015 FINAL  Final  Culture, blood (routine x 2)     Status: None   Collection Time: 08/23/15  3:59 PM  Result Value Ref Range Status   Specimen Description BLOOD RIGHT ARM  Final   Special Requests IN PEDIATRIC BOTTLE 3CC  Final   Culture NO GROWTH 5 DAYS  Final   Report Status 08/28/2015 FINAL  Final     Studies: No results found.  Scheduled Meds: . aspirin EC  325 mg Oral Daily  . baclofen  20 mg Oral TID  . bisacodyl  10 mg Oral Daily  . diazepam  5 mg Oral BID  . feeding supplement (ENSURE ENLIVE)  237 mL Oral TID BM  . feeding  supplement (PRO-STAT SUGAR FREE 64)  30 mL Oral BID  . fentaNYL  75 mcg Transdermal Q72H  . polyethylene glycol  17 g Oral Daily  . pregabalin  50 mg Oral BID  . rifampin  600 mg Oral Daily  . sodium chloride flush  10-40 mL Intracatheter Q12H  . tamsulosin  0.4 mg Oral Daily  . vancomycin  1,000 mg Intravenous Q12H    Continuous Infusions: . sodium chloride 100 mL/hr at 08/31/15 1053     Time spent: 15 minutes  Hollice Espy  [facility] Pager [phone] . If 7PM-7AM, please contact night-coverage at [email or web address], password [facility] 08/31/2015, 3:55 PM  LOS: 24 days

## 2015-08-31 NOTE — Progress Notes (Addendum)
Patient was found by this RN attempting to pull his foley catheter out. Patient had the foley bag up in the bed with him as well. This nurse noticed that there is milky looking discharge coming from the penis while assessing the foley and making sure that there was not any harm done. This RN placed safety mitts on this patient to keep him from pulling out his foley or anything else (i.e. PICC). While this nurse was holding his hands until the mitts could be placed, the patient was fighting this nurse and digging his nails into her skin. This nurse and another nurse placed the mitts, gave the patient IV valium, and repositioned the patient. Patient resting comfortably. Will continue to monitor and assess the need for the safety mitts.

## 2015-09-01 DIAGNOSIS — M4646 Discitis, unspecified, lumbar region: Secondary | ICD-10-CM

## 2015-09-01 DIAGNOSIS — N138 Other obstructive and reflux uropathy: Secondary | ICD-10-CM | POA: Diagnosis present

## 2015-09-01 DIAGNOSIS — N401 Enlarged prostate with lower urinary tract symptoms: Secondary | ICD-10-CM

## 2015-09-01 LAB — BASIC METABOLIC PANEL
Anion gap: 9 (ref 5–15)
BUN: 6 mg/dL (ref 6–20)
CALCIUM: 7.7 mg/dL — AB (ref 8.9–10.3)
CO2: 23 mmol/L (ref 22–32)
CREATININE: 0.9 mg/dL (ref 0.61–1.24)
Chloride: 110 mmol/L (ref 101–111)
GFR calc Af Amer: >60 mL/min (ref >60)
GFR calc non Af Amer: >60 mL/min (ref >60)
GLUCOSE: 159 mg/dL — AB (ref 65–99)
Potassium: 3.7 mmol/L (ref 3.5–5.1)
Sodium: 142 mmol/L (ref 135–145)

## 2015-09-01 LAB — GLUCOSE, CAPILLARY: Glucose-Capillary: 117 mg/dL — ABNORMAL HIGH (ref 65–99)

## 2015-09-01 LAB — VANCOMYCIN, TROUGH: Vancomycin Tr: 29 ug/mL (ref 10.0–20.0)

## 2015-09-01 MED ORDER — ENSURE ENLIVE PO LIQD: 237.0000 mL | Freq: Three times a day (TID) | ORAL | Status: DC

## 2015-09-01 MED ORDER — ASPIRIN 325 MG PO TBEC: 325.0000 mg | DELAYED_RELEASE_TABLET | Freq: Every day | ORAL | Status: DC

## 2015-09-01 MED ORDER — BACLOFEN 20 MG PO TABS: 20.0000 mg | ORAL_TABLET | Freq: Three times a day (TID) | ORAL | Status: DC

## 2015-09-01 MED ORDER — DIAZEPAM 5 MG PO TABS: 5.0000 mg | ORAL_TABLET | Freq: Two times a day (BID) | ORAL | Status: DC

## 2015-09-01 MED ORDER — BISACODYL 5 MG PO TBEC: 10.0000 mg | DELAYED_RELEASE_TABLET | Freq: Every day | ORAL | Status: DC

## 2015-09-01 MED ORDER — VANCOMYCIN HCL 500 MG IV SOLR
500.0000 mg | Freq: Two times a day (BID) | INTRAVENOUS | Status: DC
  Administered 2015-09-02 – 2015-09-03 (×4): 500 mg via INTRAVENOUS
  Filled 2015-09-01 (×6): qty 500

## 2015-09-01 MED ORDER — OLANZAPINE 5 MG PO TABS
5.0000 mg | ORAL_TABLET | Freq: Every day | ORAL | Status: DC
  Filled 2015-09-01 (×3): qty 1

## 2015-09-01 MED ORDER — POLYETHYLENE GLYCOL 3350 17 G PO PACK: 17.0000 g | PACK | Freq: Every day | ORAL | Status: DC

## 2015-09-01 MED ORDER — RIFAMPIN 300 MG PO CAPS: 600.0000 mg | ORAL_CAPSULE | Freq: Every day | ORAL | Status: DC

## 2015-09-01 MED ORDER — FENTANYL 75 MCG/HR TD PT72
75.0000 ug | MEDICATED_PATCH | TRANSDERMAL | Status: DC
Start: 2015-09-01 — End: 2023-06-14

## 2015-09-01 MED ORDER — PRO-STAT SUGAR FREE PO LIQD: 30.0000 mL | Freq: Two times a day (BID) | ORAL | Status: DC

## 2015-09-01 MED ORDER — VANCOMYCIN HCL IN DEXTROSE 1-5 GM/200ML-% IV SOLN: 1000.0000 mg | Freq: Two times a day (BID) | INTRAVENOUS | Status: DC

## 2015-09-01 MED ORDER — PREGABALIN 100 MG PO CAPS
100.0000 mg | ORAL_CAPSULE | Freq: Two times a day (BID) | ORAL | Status: DC
  Administered 2015-09-03: 100 mg via ORAL
  Filled 2015-09-01 (×3): qty 1

## 2015-09-01 MED ORDER — OLANZAPINE 5 MG PO TABS
5.0000 mg | ORAL_TABLET | Freq: Every day | ORAL | Status: DC
Start: 2015-09-01 — End: 2023-06-14

## 2015-09-01 MED ORDER — PREGABALIN 100 MG PO CAPS: 100.0000 mg | ORAL_CAPSULE | Freq: Two times a day (BID) | ORAL | Status: DC

## 2015-09-01 NOTE — Plan of Care (Addendum)
Problem: Acute Rehab PT Goals(only PT should resolve) Goal: Pt Will Go Supine/Side To Sit Outcome: Not Met (add Reason) This goal was not met due to pt's change in cognitive status and limited ability to participate in therapeutic interventions.   Goal: Patient Will Transfer Sit To/From Stand Outcome: Not Met (add Reason) See reason for previous goal Goal: Pt Will Transfer Bed To Chair/Chair To Bed Outcome: Not Met (add Reason) See reason for previous goal Goal: Pt/caregiver will Perform Home Exercise Program Outcome: Not Met (add Reason) See reason for previous goal  PT has signed off.  Please see most recent PTA note for further detail.  Collie Siad PT, DPT  Pager: [phone] Phone: [phone]

## 2015-09-01 NOTE — Discharge Summary (Addendum)
Discharge Summary  Jeff Ferrell ZOX:[id number] DOB: 12/27/1950  PCP: [name], MD  Admit date: 08/07/2015 Anticipated Discharge date: 09/02/2015  Time spent: 25 minutes   Recommendations for Outpatient Follow-up:  1. New medication: IV vancomycin to be managed and dosed by home health 2. New medication: Rifampin 600 mg by mouth daily 3. The following medications have been discontinued: Colace, Neurontin, Lovenox, Januvia, Robaxin, Senokot, Percocet, OxyIR 4. New medications: Fentanyl patch 75 g topically every 3 days, baclofen 20 mg by mouth 3 times a day, Zyprexa 5 mg by mouth twice a day, Valium 5 mg by mouth twice a day, Lyrica 100 mg by mouth twice a day 5. Patient will be followed by home health RN for wound VAC 3 times a week, home health aide and social work 6.   Discharge Diagnoses:  Active Hospital Problems   Diagnosis Date Noted  . Fever 08/23/2015  . Enlarged prostate with urinary obstruction 09/01/2015  . ETOH abuse 08/26/2015  . Constipation, acute 08/26/2015  . 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 Intractable pain 08/26/2015  . Encephalopathy acute   . Diabetic foot ulcer (HCC)   . Pleural effusion   . Gluteal abscess   . Chest wall abscess   . Low back pain   . Muscle spasms of both lower extremities   . Hypokalemia   . Acute pyelonephritis   . Uncontrolled type 2 diabetes mellitus with complication (HCC)   . Acute urinary retention   . Sepsis due to methicillin resistant Staphylococcus aureus (MRSA) (HCC)   . Hip fracture requiring operative repair (HCC)   . MRSA bacteremia   . Staphylococcus aureus bacteremia with sepsis (HCC)   . Osteomyelitis of right hip (HCC)   . Hardware complicating wound infection (HCC)   . Infection 08/08/2015  . DM type 2, goal HbA1c < 7% (HCC) 08/07/2015  . Chronic hyponatremia 08/07/2015  . Fracture of femoral neck, right, closed 07/19/2015  . Left-sided chest wall pain   . Femur fracture, left (HCC)  06/17/2015  . Discitis of lumbar region     Resolved Hospital Problems   Diagnosis Date Noted Date Resolved  . Hyponatremia 06/20/2015 09/01/2015  . Left hip pain  09/01/2015  . UTI (lower urinary tract infection) 06/11/2015 09/01/2015    Discharge Condition: Overall prognosis is poor  Diet recommendation: Regular diet with ensure 3 times a day between meals and ProStat twice a day between meals   Filed Vitals:   09/01/15 0500 09/01/15 1300  BP: 123/56 124/78  Pulse: 112 76  Temp: 98 F (36.7 C) 98.6 F (37 C)  Resp:  18    History of present illness And Hospital course:  65 year old male with past medical history of diabetes mellitus, chronic hyponatremia and with multiple admissions in the fast few months including separate ?Unprovoked hip fractures, right hip abscess and 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 At that time, also noted to have a urinary tract infection. Patient seen by orthopedic surgery and underwent irrigation and debridement of hip on 2/19. Cultures as well as blood cultures positive for MRSA. Infectious disease consulted. Patient has been on vancomycin since. Cardiology consulted for TEE which was negative as were repeat blood cultures.   On 2/24, patient underwent aspiration of left chest wall. Plan was for long-term IV antibiotics with PICC line placed on  2/28. 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 He started spiking fevers on 3/3 and infectious disease reconsulted.  Other complications during hospitalization include severe urinary retention requiring Foley catheter, blood transfusions on 2/20 and 3/1 and most significantly, severe pain that has been extremely difficult to control or oversedation. Palliative care  consulted on 3/7. D/w niece & sister & they highlight heavy beer drinking which would account for his falls & broken bones.   Had an extensive discussion with patient's son on 3/10 who lives in [place] and has not seen patient since admission. Patient's son initially angry thinking that we are trying to give him medication to keep him sedated. I assured him that is not the case. Covered comprehensive history of the patient prior to this admission and including what has been going on this admission. It is the son's goal to try to take the patient home with him on IV antibiotics at some point.   Around 3/10, patient was more lucid on fentanyl patch alone and responded well to starting of baclofen on 3/11 for spasms. However this is been intermittent. And for the next few days following, much more confused and at times aggressive. Palliative care at a goals of care meeting with the patient's sons.  One of the patient's sons has been quite hostile and aggressive and after extensive debating back and forth, the patient's son wants to take the patient home with home health and try to manage his father's care at home. Despite concerns from other family members and from multiple physicians, patient's son is insistent.  Plan will be the patient will go home with wound VAC, hospital bed, IV antibiotics and home health follow-up.   Acute on chronic encephalopathy: Suspect that there may be some underlying chronic issues associated with patient's long-term drinking and now with prolonged hospitalization/ illness and infection.  Heavy alcohol abuse: out of window for withdrawals. This would account for his falls & multiple fractures   Consultants:  Infectious Disease  IR  Orthopedics  Palliative Care  Cardiology  Procedures: 2/19 - I&D infected R hip wound  2/20 1 units packed red blood cells transfused 2/24 TEE EF= 60% to 65%. 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 If you have any questions about your discharge medications or the care you received while you were in the hospital after you are discharged, you can call the unit and asked to speak with the hospitalist on call if the hospitalist that took care of you is not available. Once you are discharged, your primary care physician will handle any further medical issues.  Please note that NO REFILLS for any discharge medications will be authorized once you are discharged, as it is imperative that you return to your primary care physician (or establish a relationship with a primary care physician if you do not have one) for your aftercare needs so that they can reassess your need for medications and monitor your lab values.     Medication List    STOP taking these medications        docusate sodium 100 MG capsule  Commonly known as:  COLACE     enoxaparin 40 MG/0.4ML injection  Commonly known as:  LOVENOX     gabapentin 100 MG capsule  Commonly known as:  NEURONTIN     magnesium hydroxide 400 MG/5ML suspension  Commonly known as:  MILK OF MAGNESIA     methocarbamol 500 MG tablet  Commonly known as:   ROBAXIN     oxyCODONE 5 MG immediate release tablet  Commonly known as:  Oxy IR/ROXICODONE     oxyCODONE-acetaminophen 5-325 MG tablet  Commonly known as:  PERCOCET     sennosides-docusate sodium 8.6-50 MG tablet  Commonly known as:  SENOKOT-S     sitaGLIPtin 50 MG tablet  Commonly known as:  JANUVIA      TAKE these medications        acetaminophen 325 MG tablet  Commonly known as:  TYLENOL  Take 2 tablets (650 mg total) by mouth every 6 (six) hours as needed for mild pain (or Fever >/= 101).     aspirin 325 MG EC tablet  Take 1 tablet (325 mg total) by mouth daily.     baclofen 20 MG tablet  Commonly known as:  LIORESAL  Take 1 tablet (20 mg total) by mouth 3 (three) times daily.     bisacodyl 5 MG EC tablet  Commonly known as:  DULCOLAX  Take 2 tablets (10 mg total) by mouth daily.     diazepam 5 MG tablet  Commonly known as:  VALIUM  Take 1 tablet (5 mg total) by mouth 2 (two) times daily.     feeding supplement (ENSURE ENLIVE) Liqd  Take 237 mLs by mouth 3 (three) times daily between meals.     feeding supplement (PRO-STAT SUGAR FREE 64) Liqd  Take 30 mLs by mouth 2 (two) times daily.     fentaNYL 75 MCG/HR  Commonly known as:  DURAGESIC - dosed mcg/hr  Place 1 patch (75 mcg total) onto the skin every 3 (three) days.     OLANZapine 5 MG tablet  Commonly known as:  ZYPREXA  Take 1 tablet (5 mg total) by mouth at bedtime.     polyethylene glycol packet  Commonly known as:  MIRALAX / GLYCOLAX  Take 17 g by mouth daily.     pregabalin 100 MG capsule  Commonly known as:  LYRICA  Take 1 capsule (100 mg total) by mouth 2 (two) times daily.     rifampin 300 MG capsule  Commonly known as:  RIFADIN  Take 2 capsules (600 mg total) by mouth daily.     tamsulosin 0.4 MG Caps capsule  Commonly known as:  FLOMAX  Take 1 capsule (0.4 mg total) by mouth daily.     vancomycin 1 GM/200ML Soln  Commonly known as:  VANCOCIN  Inject 200 mLs (1,000 mg total) into the  vein every 12 (twelve) hours.       Allergies  Allergen Reactions  . Penicillins Hives and Rash  Has patient had a PCN reaction causing immediate rash, facial/tongue/throat swelling, SOB or lightheadedness with hypotension: Yes Has patient had a PCN reaction causing severe rash involving mucus membranes or skin necrosis: Yes   Has patient had a PCN reaction that required hospitalization No Has patient had a PCN reaction occurring within the last 10 years: No If all of the above answers are "NO", then may proceed with Cephalosporin use.        Follow-up Information    Follow up with Cheral Almas, MD In 2 weeks.   Specialty:  Orthopedic Surgery   Why:  For wound re-check   Contact information:   [address] [place] [address] [phone]        The results of significant diagnostics from this hospitalization (including imaging, microbiology, ancillary and laboratory) are listed below for reference.    Significant Diagnostic Studies: Dg Chest 2 View  08/15/2015  CLINICAL DATA:  Check for possible effusion EXAM: CHEST  2 VIEW COMPARISON:  08/14/2015 FINDINGS: Cardiac shadow is within normal limits. The lungs are well-aerated bilaterally. Small pleural effusions are noted on the lateral projection likely too small for significant thoracentesis. No focal infiltrate is noted. IMPRESSION: Small pleural effusions bilaterally likely too small for adequate thoracentesis. No focal infiltrate is noted. Electronically Signed   By: Alcide Clever M.D.   On: 08/15/2015 20:01   Ct Head Wo Contrast  08/24/2015  CLINICAL DATA:  Acute encephalopathy.  The patient is unresponsive. EXAM: CT HEAD WITHOUT CONTRAST TECHNIQUE: Contiguous axial images were obtained from the base of the skull through the vertex without intravenous contrast. COMPARISON:  None. FINDINGS: There is chronic diffuse atrophy. There is no midline shift, hydrocephalus, or mass. No acute hemorrhage or acute  transcortical infarct is identified. The bony calvarium is intact. There is mucoperiosteal thickening of the right maxillary sinus. IMPRESSION: No focal acute intracranial abnormality identified. Chronic diffuse atrophy. Electronically Signed   By: Sherian Rein M.D.   On: 08/24/2015 13:42   Mr Thoracic Spine W Wo Contrast  08/12/2015  ADDENDUM REPORT: 08/12/2015 13:50 ADDENDUM: Study discussed by telephone with Dr. Gwen Her Dam On 08/12/2015 at 1345 hours. Electronically Signed   By: Odessa Fleming M.D.   On: 08/12/2015 13:50  08/12/2015  CLINICAL DATA:  65 year old male with recent hip surgery, became septic, and now with new onset back pain. Query discitis osteomyelitis. Indeterminate signal changes of the L5-S1 disc space since December. Initial encounter. Left rib fractures detected on portable chest x-ray in January. EXAM: MRI THORACIC AND LUMBAR SPINE WITHOUT AND WITH CONTRAST TECHNIQUE: Multiplanar and multiecho pulse sequences of the thoracic and lumbar spine were obtained without and with intravenous contrast. CONTRAST:  15mL MULTIHANCE GADOBENATE DIMEGLUMINE 529 MG/ML IV SOLN COMPARISON:  Hip intraoperative images 2117. Lumbar MRI 07/01/2015 and earlier. Thoracic and lumbar spine radiographs 11/24/2010. FINDINGS: MR THORACIC SPINE FINDINGS Limited sagittal imaging of the cervical spine is remarkable for diffuse disc degeneration with what appears to be at least moderate degenerative cervical spinal stenosis at C2-C3 and C3-C4 (series 2, image 6). Left greater than right layering pleural effusions with confluent abnormal increased signal in both lower lobes. Multilevel (at least 5 levels) displaced left posterior rib fractures re- demonstrated (series 7, image 14), and there is an associated rim enhancing fluid collection in the intercostal muscles measuring about 40 mm in length and 10 mm in width (series 22, image 15 and series 7, image 15). This appears to be a just deep  to the left inferior scapula. Other  posterior paraspinal soft tissues are within normal limits. Preserved thoracic vertebral height and alignment. T2 and T11 vertebral body benign hemangiomas. No thoracic vertebral marrow edema or evidence of acute osseous abnormality. No inflammatory signal of the thoracic intervertebral discs. Degenerative multifactorial mild thoracic spinal stenosis from T3-T4 through T9-T10. Mild if any associated thoracic spinal cord mass effect. Spinal cord signal is within normal limits at all visualized levels. No dural thickening or enhancement. No abnormal intradural enhancement. The conus medullaris appears normal at T12-L1. MR LUMBAR SPINE FINDINGS Same numbering system as on the recent comparison lumbar MRIs. 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 Signal to noise in the lower lumbar levels is poor today, resulting in partial obscuration of the L3 through L5 vertebrae and thecal sac. The urinary bladder is severely distended. There is secondary hydronephrosis. There is edema in the right gluteus muscle immediately posterior to the right iliac wing (series 17, image 34) with an associated small 2 cm mildly heterogeneous rim enhancing fluid collection (series 20, image 34). Otherwise postoperative changes to the lumbar posterior paraspinal soft tissues appear stable. Allowing for chronic hardware susceptibility artifact and the suboptimal signal to noise in the lumbar spine today, there is no acute osseous abnormality or interval change in the appearance of the lumbar disc spaces. There is chronic adjacent segment disease with mild to moderate multifactorial spinal stenosis at L1-L2. The conus medullaris appears normal at T12-L1. No abnormal intradural enhancement is evident. IMPRESSION: 1. Massively distended urinary bladder, causing secondary hydronephrosis. Recommend bladder decompression with Foley catheter. 2. No evidence of acute thoracic or lumbar spinal infection. Suboptimal MRI  visualization of the lumbar spine today. 3. Rim enhancing fluid collections in the posterior left chest wall fluid - associated with multiple displaced rib fractures - and in the right gluteal muscle fluid collection are suspicious for phlegmon/abscess in this setting. 4. Left greater than right pleural effusions with bilateral lower lobe pneumonia. 5. Degenerative thoracic spinal stenosis from T3-T4 to T9-T10. Stable degenerative lumbar spinal stenosis at L1-L2. At least moderate degenerative cervical spinal stenosis at C2-C3 and C3-C4. Electronically Signed: By: Odessa Fleming M.D. On: 08/12/2015 13:32   Mr Lumbar Spine W Wo Contrast  08/12/2015  ADDENDUM REPORT: 08/12/2015 13:50 ADDENDUM: Study discussed by telephone with Dr. Gwen Her Dam On 08/12/2015 at 1345 hours. Electronically Signed   By: Odessa Fleming M.D.   On: 08/12/2015 13:50  08/12/2015  CLINICAL DATA:  65 year old male with recent hip surgery, became septic, and now with new onset back pain. Query discitis osteomyelitis. Indeterminate signal changes of the L5-S1 disc space since December. Initial encounter. Left rib fractures detected on portable chest x-ray in January. EXAM: MRI THORACIC AND LUMBAR SPINE WITHOUT AND WITH CONTRAST TECHNIQUE: Multiplanar and multiecho pulse sequences of the thoracic and lumbar spine were obtained without and with intravenous contrast. CONTRAST:  15mL MULTIHANCE GADOBENATE DIMEGLUMINE 529 MG/ML IV SOLN COMPARISON:  Hip intraoperative images 2117. Lumbar MRI 07/01/2015 and earlier. Thoracic and lumbar spine radiographs 11/24/2010. FINDINGS: MR THORACIC SPINE FINDINGS Limited sagittal imaging of the cervical spine is remarkable for diffuse disc degeneration with what appears to be at least moderate degenerative cervical spinal stenosis at C2-C3 and C3-C4 (series 2, image 6). Left greater than right layering pleural effusions with confluent abnormal increased signal in both lower lobes. Multilevel (at least 5 levels) displaced left  posterior rib fractures re- demonstrated (series 7, image 14), and there is an  associated rim enhancing fluid collection in the intercostal muscles measuring about 40 mm in length and 10 mm in width (series 22, image 15 and series 7, image 15). This appears to be a just deep to the left inferior scapula. Other posterior paraspinal soft tissues are within normal limits. Preserved thoracic vertebral height and alignment. T2 and T11 vertebral body benign hemangiomas. No thoracic vertebral marrow edema or evidence of acute osseous abnormality. No inflammatory signal of the thoracic intervertebral discs. Degenerative multifactorial mild thoracic spinal stenosis from T3-T4 through T9-T10. Mild if any associated thoracic spinal cord mass effect. Spinal cord signal is within normal limits at all visualized levels. No dural thickening or enhancement. No abnormal intradural enhancement. The conus medullaris appears normal at T12-L1. MR LUMBAR SPINE FINDINGS Same numbering system as on the recent comparison lumbar MRIs. 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 Signal to noise in the lower lumbar levels is poor today, resulting in partial obscuration of the L3 through L5 vertebrae and thecal sac. The urinary bladder is severely distended. There is secondary hydronephrosis. There is edema in the right gluteus muscle immediately posterior to the right iliac wing (series 17, image 34) with an associated small 2 cm mildly heterogeneous rim enhancing fluid collection (series 20, image 34). Otherwise postoperative changes to the lumbar posterior paraspinal soft tissues appear stable. Allowing for chronic hardware susceptibility artifact and the suboptimal signal to noise in the lumbar spine today, there is no acute osseous abnormality or interval change in the appearance of the lumbar disc spaces. There is chronic adjacent segment disease with mild to moderate multifactorial spinal stenosis at L1-L2.  The conus medullaris appears normal at T12-L1. No abnormal intradural enhancement is evident. IMPRESSION: 1. Massively distended urinary bladder, causing secondary hydronephrosis. Recommend bladder decompression with Foley catheter. 2. No evidence of acute thoracic or lumbar spinal infection. Suboptimal MRI visualization of the lumbar spine today. 3. Rim enhancing fluid collections in the posterior left chest wall fluid - associated with multiple displaced rib fractures - and in the right gluteal muscle fluid collection are suspicious for phlegmon/abscess in this setting. 4. Left greater than right pleural effusions with bilateral lower lobe pneumonia. 5. Degenerative thoracic spinal stenosis from T3-T4 to T9-T10. Stable degenerative lumbar spinal stenosis at L1-L2. At least moderate degenerative cervical spinal stenosis at C2-C3 and C3-C4. Electronically Signed: By: Odessa Fleming M.D. On: 08/12/2015 13:32   Ir Veno/ext/uni Right  08/16/2015  INDICATION: DIABETIC FOOT ULCER, ACCESS FOR LONG-TERM ANTIBIOTICS EXAM: ULTRASOUND GUIDANCE FOR VASCULAR ACCESS RIGHT UPPER EXTREMITY VENOGRAM LEFT UPPER EXTREMITY PICC LINE PLACEMENT WITH ULTRASOUND AND FLUOROSCOPIC GUIDANCE MEDICATIONS: 1% LIDOCAINE LOCALLY ANESTHESIA/SEDATION: NONE. The patient's level of consciousness and vital signs were monitored continuously by radiology nursing throughout the procedure under my direct supervision. FLUOROSCOPY TIME:  Fluoroscopy Time: 3 minutes 30 seconds (17 mGy). COMPLICATIONS: None immediate. PROCEDURE: The patient was advised of the possible risks and complications and agreed to undergo the procedure. The patient was then brought to the angiographic suite for the procedure. Ultrasound guidance for vascular access and right upper extremity venogram: Initially under sterile conditions and local anesthesia, right basilic micropuncture venous access performed with ultrasound. Images were obtained for documentation. Basilic vein was  demonstrated to be patent. Guidewire would not advance centrally for accurate measurements for a PICC line length. Five French sheath inserted. Contrast injection performed for central right upper extremity venogram. Right upper extremity venogram: Right brachial and axillary veins are patent. 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 Jugular collaterals are present opacifying the patent right innominate vein and SVC. Several attempts were made to cross the right subclavian venous occlusion with a Kumpe catheter and guidewire however this was unsuccessful. Therefore this access was aborted for PICC line placement. Left arm will be utilized. Left upper extremity PICC line insertion with ultrasound guidance: The left arm was prepped with chlorhexidine, draped in the usual sterile fashion using maximum barrier technique (cap and mask, sterile gown, sterile gloves, large sterile sheet, hand hygiene and cutaneous antisepsis) and infiltrated locally with 1% Lidocaine. Ultrasound demonstrated patency of the left basilic vein, and this was documented with an image. Under real-time ultrasound guidance, this vein was accessed with a 21 gauge micropuncture needle and image documentation was performed. A 0.018 wire was introduced in to the vein. Over this, a 5 [place] single lumen power PICC was advanced to the lower SVC/right atrial junction. Fluoroscopy during the procedure and fluoro spot radiograph confirms appropriate catheter position. The catheter was flushed and covered with asterile dressing. Catheter length: None immediate IMPRESSION: Initial attempts at placement of a right PICC line were unsuccessful related to difficulty pass the guidewire. 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 Successful left arm power PICC line placement with ultrasound and fluoroscopic guidance. The catheter is ready for use.  Electronically Signed   By: Judie Petit.  Shick M.D.   On: 08/16/2015 17:24   Ct Hip Right Wo Contrast  08/07/2015  CLINICAL DATA:  Infection. Patient post right hip surgery for right femoral neck fracture 07/21/2015, now with drainage from wound and fever and right hip pain. EXAM: CT OF THE RIGHT HIP WITHOUT CONTRAST TECHNIQUE: Multidetector CT imaging of the right hip was performed according to the standard protocol. Multiplanar CT image reconstructions were also generated. COMPARISON:  Right hip CT 07/20/2015 FINDINGS: Femoral nail with lag screw fixation of comminuted right femoral neck fracture. No abnormal lucency about the included hardware. Improved fracture alignment compared to preoperative radiograph. Questionable minimal interval fracture healing with some periosteal new bone about the lesser trochanter. There is skin thickening and diffuse subcutaneous edema laterally with skin staples in place. Small curvilinear fluid collection just superficial to the vastus lateralis musculature, with indistinct muscle planes laterally. This fluid collection measures up to 12 mm in depth. Heterogeneous enlargement of right gluteus medius musculature with some internal densities, likely sequela of IM nail. Heterogeneous fluid adjacent to the periosteal new bone about the lesser trochanter. Questionable tiny foci of air in the operative bed, axial image 52 and 39. Limited assessment for effusion secondary to streak artifact from surgical hardware. Enlarged right external iliac and inguinal lymph nodes. Diffuse urinary bladder wall thickening. Pelvic and sacral hardware is partially included. IMPRESSION: 1. Post recent fixation of right femoral neck fracture. Diffuse inflammatory change in the subcutaneous tissues most prominent laterally, with small crescentic fluid collection superficial to the vastus lateralis measuring up to 12 mm. Abscess versus hematoma, abscess favored in the setting of fever and infection. 2.  Heterogeneous enlargement of the right gluteus medius musculature, may reflect postoperative hematoma versus infection. 3. Right inguinal and external iliac adenopathy, likely reactive. 4. Diffuse urinary bladder wall thickening, most consistent with urinary tract infection. Electronically Signed   By: Rubye Oaks M.D.   On: 08/07/2015 18:32   Ct Aspiration  08/13/2015  CLINICAL DATA:  Posterior left subscapular fluid collection adjacent to rib fractures. History of MRSA  bacteremia EXAM: CT GUIDANCE NEEDLE PLACEMENT FLUOROSCOPY TIME:  None MEDICATIONS AND MEDICAL HISTORY: Versed 2 mg, Fentanyl 150 mcg. Additional Medications: None. ANESTHESIA/SEDATION: Moderate sedation time: 12 minutes CONTRAST:  None PROCEDURE: The procedure, risks, benefits, and alternatives were explained to the patient. Questions regarding the procedure were encouraged and answered. The patient understands and consents to the procedure. The left posterior thorax was prepped with ChloraPrep in a sterile fashion, and a sterile drape was applied covering the operative field. A sterile gown and sterile gloves were used for the procedure. Under CT guidance, an 18 gauge needle was inserted into the subscapular fluid collection. 3 cc serosanguineous fluid was aspirated and sent for culture. FINDINGS: Imaging documents needle placement in the subcapsular fluid collection. Post aspiration imaging demonstrates no pneumothorax or hemorrhage. COMPLICATIONS: None IMPRESSION: Successful left subscapular fluid aspiration yielding 3 cc serosanguineous fluid. Electronically Signed   By: Jolaine Click M.D.   On: 08/13/2015 13:37   Ir Fluoro Guide Cv Line Left  08/16/2015  INDICATION: DIABETIC FOOT ULCER, ACCESS FOR LONG-TERM ANTIBIOTICS EXAM: ULTRASOUND GUIDANCE FOR VASCULAR ACCESS RIGHT UPPER EXTREMITY VENOGRAM LEFT UPPER EXTREMITY PICC LINE PLACEMENT WITH ULTRASOUND AND FLUOROSCOPIC GUIDANCE MEDICATIONS: 1% LIDOCAINE LOCALLY ANESTHESIA/SEDATION:  NONE. The patient's level of consciousness and vital signs were monitored continuously by radiology nursing throughout the procedure under my direct supervision. FLUOROSCOPY TIME:  Fluoroscopy Time: 3 minutes 30 seconds (17 mGy). COMPLICATIONS: None immediate. PROCEDURE: The patient was advised of the possible risks and complications and agreed to undergo the procedure. The patient was then brought to the angiographic suite for the procedure. Ultrasound guidance for vascular access and right upper extremity venogram: Initially under sterile conditions and local anesthesia, right basilic micropuncture venous access performed with ultrasound. Images were obtained for documentation. Basilic vein was demonstrated to be patent. Guidewire would not advance centrally for accurate measurements for a PICC line length. Five French sheath inserted. Contrast injection performed for central right upper extremity venogram. Right upper extremity venogram: Right brachial and axillary veins are patent. 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 Jugular collaterals are present opacifying the patent right innominate vein and SVC. Several attempts were made to cross the right subclavian venous occlusion with a Kumpe catheter and guidewire however this was unsuccessful. Therefore this access was aborted for PICC line placement. Left arm will be utilized. Left upper extremity PICC line insertion with ultrasound guidance: The left arm was prepped with chlorhexidine, draped in the usual sterile fashion using maximum barrier technique (cap and mask, sterile gown, sterile gloves, large sterile sheet, hand hygiene and cutaneous antisepsis) and infiltrated locally with 1% Lidocaine. Ultrasound demonstrated patency of the left basilic vein, and this was documented with an image. Under real-time ultrasound guidance, this vein was accessed with a 21 gauge micropuncture needle and image  documentation was performed. A 0.018 wire was introduced in to the vein. Over this, a 5 [place] single lumen power PICC was advanced to the lower SVC/right atrial junction. Fluoroscopy during the procedure and fluoro spot radiograph confirms appropriate catheter position. The catheter was flushed and covered with asterile dressing. Catheter length: None immediate IMPRESSION: Initial attempts at placement of a right PICC line were unsuccessful related to difficulty pass the guidewire. 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 Successful left arm power PICC line placement with ultrasound and fluoroscopic guidance. The catheter is ready for use. Electronically Signed   By: Judie Petit.  Shick M.D.  On: 08/16/2015 17:24   Ir US Guide Vasc Access Left  08/16/2015  INDICATION: DIABETIC FOOT ULCER, ACCESS FOR LONG-TERM ANTIBIOTICS EXAM: ULTRASOUND GUIDANCE FOR VASCULAR ACCESS RIGHT UPPER EXTREMITY VENOGRAM LEFT UPPER EXTREMITY PICC LINE PLACEMENT WITH ULTRASOUND AND FLUOROSCOPIC GUIDANCE MEDICATIONS: 1% LIDOCAINE LOCALLY ANESTHESIA/SEDATION: NONE. The patient's level of consciousness and vital signs were monitored continuously by radiology nursing throughout the procedure under my direct supervision. FLUOROSCOPY TIME:  Fluoroscopy Time: 3 minutes 30 seconds (17 mGy). COMPLICATIONS: None immediate. PROCEDURE: The patient was advised of the possible risks and complications and agreed to undergo the procedure. The patient was then brought to the angiographic suite for the procedure. Ultrasound guidance for vascular access and right upper extremity venogram: Initially under sterile conditions and local anesthesia, right basilic micropuncture venous access performed with ultrasound. Images were obtained for documentation. Basilic vein was demonstrated to be patent. Guidewire would not advance centrally for accurate measurements for a PICC line length. Five French sheath inserted.  Contrast injection performed for central right upper extremity venogram. Right upper extremity venogram: Right brachial and axillary veins are patent. 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 Jugular collaterals are present opacifying the patent right innominate vein and SVC. Several attempts were made to cross the right subclavian venous occlusion with a Kumpe catheter and guidewire however this was unsuccessful. Therefore this access was aborted for PICC line placement. Left arm will be utilized. Left upper extremity PICC line insertion with ultrasound guidance: The left arm was prepped with chlorhexidine, draped in the usual sterile fashion using maximum barrier technique (cap and mask, sterile gown, sterile gloves, large sterile sheet, hand hygiene and cutaneous antisepsis) and infiltrated locally with 1% Lidocaine. Ultrasound demonstrated patency of the left basilic vein, and this was documented with an image. Under real-time ultrasound guidance, this vein was accessed with a 21 gauge micropuncture needle and image documentation was performed. A 0.018 wire was introduced in to the vein. Over this, a 5 [place] single lumen power PICC was advanced to the lower SVC/right atrial junction. Fluoroscopy during the procedure and fluoro spot radiograph confirms appropriate catheter position. The catheter was flushed and covered with asterile dressing. Catheter length: None immediate IMPRESSION: Initial attempts at placement of a right PICC line were unsuccessful related to difficulty pass the guidewire. 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 Successful left arm power PICC line placement with ultrasound and fluoroscopic guidance. The catheter is ready for use. Electronically Signed   By: Judie Petit.  Shick M.D.   On: 08/16/2015 17:24   Ir US Guide Vasc Access Right  08/16/2015  INDICATION: DIABETIC FOOT ULCER, ACCESS  FOR LONG-TERM ANTIBIOTICS EXAM: ULTRASOUND GUIDANCE FOR VASCULAR ACCESS RIGHT UPPER EXTREMITY VENOGRAM LEFT UPPER EXTREMITY PICC LINE PLACEMENT WITH ULTRASOUND AND FLUOROSCOPIC GUIDANCE MEDICATIONS: 1% LIDOCAINE LOCALLY ANESTHESIA/SEDATION: NONE. The patient's level of consciousness and vital signs were monitored continuously by radiology nursing throughout the procedure under my direct supervision. FLUOROSCOPY TIME:  Fluoroscopy Time: 3 minutes 30 seconds (17 mGy). COMPLICATIONS: None immediate. PROCEDURE: The patient was advised of the possible risks and complications and agreed to undergo the procedure. The patient was then brought to the angiographic suite for the procedure. Ultrasound guidance for vascular access and right upper extremity venogram: Initially under sterile conditions and local anesthesia, right basilic micropuncture venous access performed with ultrasound. Images were obtained for documentation. Basilic vein was demonstrated to be patent. Guidewire would not advance centrally for accurate  measurements for a PICC line length. Five French sheath inserted. Contrast injection performed for central right upper extremity venogram. Right upper extremity venogram: Right brachial and axillary veins are patent. 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 Jugular collaterals are present opacifying the patent right innominate vein and SVC. Several attempts were made to cross the right subclavian venous occlusion with a Kumpe catheter and guidewire however this was unsuccessful. Therefore this access was aborted for PICC line placement. Left arm will be utilized. Left upper extremity PICC line insertion with ultrasound guidance: The left arm was prepped with chlorhexidine, draped in the usual sterile fashion using maximum barrier technique (cap and mask, sterile gown, sterile gloves, large sterile sheet, hand hygiene and cutaneous antisepsis) and infiltrated  locally with 1% Lidocaine. Ultrasound demonstrated patency of the left basilic vein, and this was documented with an image. Under real-time ultrasound guidance, this vein was accessed with a 21 gauge micropuncture needle and image documentation was performed. A 0.018 wire was introduced in to the vein. Over this, a 5 [place] single lumen power PICC was advanced to the lower SVC/right atrial junction. Fluoroscopy during the procedure and fluoro spot radiograph confirms appropriate catheter position. The catheter was flushed and covered with asterile dressing. Catheter length: None immediate IMPRESSION: Initial attempts at placement of a right PICC line were unsuccessful related to difficulty pass the guidewire. 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 Successful left arm power PICC line placement with ultrasound and fluoroscopic guidance. The catheter is ready for use. Electronically Signed   By: Judie Petit.  Shick M.D.   On: 08/16/2015 17:24   Dg Chest Port 1 View  08/23/2015  CLINICAL DATA:  Fever. EXAM: PORTABLE CHEST 1 VIEW COMPARISON:  08/15/2015 FINDINGS: Shallow inspiration. Atelectasis in the lung bases. Normal heart size and pulmonary vascularity. No focal consolidation in the lungs. No blunting of costophrenic angles. No pneumothorax. Nodular opacity projected over the left upper lung measuring 1.2 cm. This has been present previously appears represent old left rib fracture. Additional old left rib fractures also identified. Degenerative changes in the shoulders. Left PICC catheter with tip over the low SVC. IMPRESSION: Shallow inspiration with atelectasis in the lung bases. Electronically Signed   By: Burman Nieves M.D.   On: 08/23/2015 22:35   Dg Chest Port 1 View  08/14/2015  CLINICAL DATA:  Pleural effusion EXAM: PORTABLE CHEST 1 VIEW COMPARISON:  August 07, 2015 FINDINGS: No pneumothorax. The cardiomediastinal silhouette is stable. Mild increased  interstitial markings consistent with mild edema. These findings are mildly worsened on the left than the right. No focal infiltrate. No effusion identified on this single frontal view. IMPRESSION: Increased interstitial markings consistent with mild edema, new in the interval. No definite pleural effusion identified. Electronically Signed   By: Gerome Sam III M.D   On: 08/14/2015 07:28   Dg Chest Port 1 View  08/07/2015  CLINICAL DATA:  Pt has an infected incision on his right hip. Hx of DM. No chest complaints. Pt is a nonsmoker EXAM: PORTABLE CHEST 1 VIEW COMPARISON:  07/02/2015 FINDINGS: Cardiac silhouette is normal in size and configuration. Normal mediastinal and hilar contours. Clear lungs.  No pleural effusion or pneumothorax. Bony thorax is intact. IMPRESSION: No active disease. Electronically Signed   By: Amie Portland M.D.   On: 08/07/2015 11:24   Dg Hip Port Unilat With Pelvis 1v Right  08/07/2015  CLINICAL DATA:  Right  hip fracture, status post operative fixation 2 weeks ago. Surgical incision infection with right hip pain. EXAM: DG HIP (WITH OR WITHOUT PELVIS) 1V PORT RIGHT COMPARISON:  07/18/2015 FINDINGS: Fixation hardware of the lumbar sacral spine and both hips evident. Recent right hip ORIF for an intertrochanteric fracture. Stable hardware and alignment. No definite hardware abnormality or acute osseous finding. Bony pelvis intact. Normal bowel gas pattern. IMPRESSION: No definite acute osseous or hardware abnormality by plain radiography. Electronically Signed   By: Judie PetitM.  Shick M.D.   On: 08/07/2015 11:26    Microbiology: Recent Results (from the past 240 hour(s))  Culture, blood (routine x 2)     Status: None   Collection Time: 08/23/15  3:59 PM  Result Value Ref Range Status   Specimen Description BLOOD RIGHT HAND  Final   Special Requests IN PEDIATRIC BOTTLE 3CC  Final   Culture NO GROWTH 5 DAYS  Final   Report Status 08/28/2015 FINAL  Final  Culture, blood (routine x 2)      Status: None   Collection Time: 08/23/15  3:59 PM  Result Value Ref Range Status   Specimen Description BLOOD RIGHT ARM  Final   Special Requests IN PEDIATRIC BOTTLE 3CC  Final   Culture NO GROWTH 5 DAYS  Final   Report Status 08/28/2015 FINAL  Final  Culture, Urine     Status: None (Preliminary result)   Collection Time: 08/31/15  4:52 PM  Result Value Ref Range Status   Specimen Description URINE, CATHETERIZED  Final   Special Requests NONE  Final   Culture TOO YOUNG TO READ  Final   Report Status PENDING  Incomplete     Labs: Basic Metabolic Panel:  Recent Labs Lab 08/28/15 1455 08/30/15 0451  NA 137 140  K 3.5 3.4*  CL 103 109  CO2 25 23  GLUCOSE 111* 147*  BUN 9 10  CREATININE 0.94 1.02  CALCIUM 8.1* 7.5*   Liver Function Tests: No results for input(s): AST, ALT, ALKPHOS, BILITOT, PROT, ALBUMIN in the last 168 hours. No results for input(s): LIPASE, AMYLASE in the last 168 hours. No results for input(s): AMMONIA in the last 168 hours. CBC:  Recent Labs Lab 08/26/15 1145 08/27/15 0443 08/28/15 0552  WBC 10.3 8.6 7.8  HGB 7.9* 8.1* 8.0*  HCT 24.3* 26.5* 25.8*  MCV 80.2 79.6 79.6  PLT 370 407* 371   Cardiac Enzymes: No results for input(s): CKTOTAL, CKMB, CKMBINDEX, TROPONINI in the last 168 hours. BNP: BNP (last 3 results)  Recent Labs  06/11/15 1106 07/02/15 0538  BNP 27.7 34.0    ProBNP (last 3 results) No results for input(s): PROBNP in the last 8760 hours.  CBG:  Recent Labs Lab 08/29/15 2336 08/30/15 1218 08/30/15 2257 08/31/15 0658 08/31/15 2251  GLUCAP 150* 112* 104* 117* 97       Signed:  Versie Fleener K  [facility] 09/01/2015, 2:31 PM

## 2015-09-01 NOTE — Progress Notes (Signed)
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 At that time, also noted to have a urinary tract infection. Patient seen by orthopedic surgery and underwent irrigation and debridement of hip on 2/19. Cultures as well as blood cultures positive for MRSA. Infectious disease consulted. Patient has been on vancomycin since. Cardiology consulted for TEE which was negative as were repeat blood cultures.    On 2/24, patient underwent aspiration of left chest wall. Plan was for long-term IV antibiotics with PICC line placed on 2/28. 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 He started spiking fevers on 3/3 and infectious disease reconsulted.  Other complications during hospitalization include severe urinary retention requiring Foley catheter, blood transfusions on 2/20 and 3/1 and most significantly, severe pain that has been extremely difficult to control or oversedation.  Palliative care consulted on 3/7.  D/w niece & sister & they highlight heavy beer drinking which would account for his falls & broken bones.    Had an extensive discussion with patient's son on 3/10 who lives in [place] and has not seen patient since admission. Patient's son initially angry thinking that we are trying to give him medication to keep him sedated. I assured him that is the case. Covered comprehensive history of the patient prior to this admission and including what  has been going on this admission. It is the son's goal to try to take the patient home with him on IV antibiotics at some point. On night of 3/10, patient's son came in from [place]. He was quite upset by nursing reports about seeing his father in his condition. Nurses informed patient's son that his father was not on restraints, and that hand as were done so that drinking stated he would not remove IV lines. Patient's son upset and left.   For the last few days, patient was more lucid on fentanyl patch alone 7 responded well to starting of baclofen on 3/11 for spasms.   However, more confused overnight on 3/13, trying to self remove Foley.   Palliative care has been able to set up a potential for family meeting on 3/15 for goals of care and long-term plan.  Patient today withdrawn, irritable and still confused. He is also been accepted for [facility].  Assessment/Plan: Principal Problem:   Sepsis with MRSA polynephritis, infected right hip post fracture repair with secondary right gluteal abscess, left scapular phlegmon and left chest wall & intractable pain. Status post I&D 2. Currently on vancomycin and rifampin. Appreciate infectious disease help. Continues to remain afebrile, although has had an occasional low-grade temperature.. Repeat blood cultures have been negative. On wound VAC.  Appreciate Pall care help.  Cont fentanyl patch with prn fentanyl injections.  Continue baclofen at 20 mg by mouth 3 times a day.  Plan is to continue antibiotics until 4/1 and then reassess. Active Problems:   UTI (lower urinary tract infection): Felt to have been treated, given antibiotics.    DM type 2, goal HbA1c < 7% (HCC)  poor by mouth intake. Blood sugars stable. On supplemental shakes. 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 Now normalized. Stable.    Acute urinary retention: Now resolved. Passed voiding trial.   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  Heavy alcohol abuse: out of window for withdrawals.  This would account for his falls & multiple fractures  Code Status: Full code   Family Communication: Goals of care meeting today was by palliative care for the entire family  Disposition Plan: Hopefully be determined from meeting   Consultants:  Infectious Disease  IR  Orthopedics  Palliative Care  Cardiology  Procedures: 2/19 - I&D infected R hip wound  2/20 1 units packed red blood cells transfused 2/24 TEE EF= 60% to 65%. 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 with excisional debridement of the skin, muscle, bone 25 x 5 cm w/ incision and drainage of right gluteal abscess & partial secondary closure of previous surgical incision  Antibiotics: Aztreonam 2/18-2/19 Vancomycin 2/18-present Ceftriaxone 2/19-2/20 Rifampin 2/19- present   Objective: BP 123/56 mmHg  Pulse 112  Temp(Src) 98 F (36.7 C) (Axillary)  Resp 18  Ht 5\' 9"  (1.753 m)  Wt 73.1 kg (161 lb 2.5 oz)  BMI 23.79 kg/m2  SpO2 98%  Intake/Output Summary (Last 24 hours) at 09/01/15 1019 Last data filed at 09/01/15 0700  Gross per 24 hour  Intake   1340 ml  Output   1650 ml  Net   -310 ml   Filed Weights   08/09/15 0255  Weight: 73.1 kg (161 lb 2.5 oz)    Exam: Little change from yesterday  General:  Continue withdrawn, irritable  Cardiovascular: Regular rhythm, borderline tachycardia  Respiratory:  Appears clear, but poor inspiratory effort  Abdomen: Soft, non-tender?, Minimal distention, hypoactive bowel sounds    Musculoskeletal:    emaciated , wound VAC over lateral  aspect of right leg  Data Reviewed: Basic Metabolic Panel:  Recent Labs Lab 08/28/15 1455 08/30/15 0451  NA 137 140  K 3.5 3.4*  CL 103 109  CO2 25 23  GLUCOSE 111* 147*  BUN 9 10  CREATININE 0.94 1.02  CALCIUM 8.1* 7.5*   Liver Function Tests: No results for input(s): AST, ALT, ALKPHOS, BILITOT, PROT, ALBUMIN in the last 168 hours. No results for input(s): LIPASE, AMYLASE in the last 168 hours. No results for input(s): AMMONIA in the last 168 hours. CBC:  Recent Labs Lab 08/26/15 1145 08/27/15 0443 08/28/15 0552  WBC 10.3 8.6 7.8  HGB 7.9* 8.1* 8.0*  HCT 24.3* 26.5* 25.8*  MCV 80.2 79.6 79.6  PLT 370 407* 371   Cardiac Enzymes:   No results for input(s): CKTOTAL, CKMB, CKMBINDEX, TROPONINI in the last 168 hours. BNP (last 3 results)  Recent Labs  06/11/15 1106 07/02/15 0538  BNP 27.7 34.0    ProBNP (last 3 results) No results for input(s): PROBNP in the last 8760 hours.  CBG:  Recent Labs Lab 08/29/15 2336 08/30/15 1218 08/30/15 2257 08/31/15 0658 08/31/15 2251  GLUCAP 150* 112* 104* 117* 97    Recent Results (from the past 240 hour(s))  Culture, blood (routine  x 2)     Status: None   Collection Time: 08/23/15  3:59 PM  Result Value Ref Range Status   Specimen Description BLOOD RIGHT HAND  Final   Special Requests IN PEDIATRIC BOTTLE 3CC  Final   Culture NO GROWTH 5 DAYS  Final   Report Status 08/28/2015 FINAL  Final  Culture, blood (routine x 2)     Status: None   Collection Time: 08/23/15  3:59 PM  Result Value Ref Range Status   Specimen Description BLOOD RIGHT ARM  Final   Special Requests IN PEDIATRIC BOTTLE 3CC  Final   Culture NO GROWTH 5 DAYS  Final   Report Status 08/28/2015 FINAL  Final     Studies: No results found.  Scheduled Meds: . aspirin EC  325 mg Oral Daily  . baclofen  20 mg Oral TID  . bisacodyl  10 mg Oral Daily  . diazepam  5 mg Oral BID  . feeding supplement (ENSURE ENLIVE)  237 mL Oral TID BM  . feeding  supplement (PRO-STAT SUGAR FREE 64)  30 mL Oral BID  . fentaNYL  75 mcg Transdermal Q72H  . OLANZapine  5 mg Oral QHS  . polyethylene glycol  17 g Oral Daily  . pregabalin  100 mg Oral BID  . rifampin  600 mg Oral Daily  . sodium chloride flush  10-40 mL Intracatheter Q12H  . tamsulosin  0.4 mg Oral Daily  . vancomycin  1,000 mg Intravenous Q12H    Continuous Infusions: . sodium chloride 100 mL/hr at 09/01/15 1914     Time spent: 15 minutes  Hollice Espy  [facility] Pager [phone] . If 7PM-7AM, please contact night-coverage at [email or web address], password [email or web address] 09/01/2015, 10:19 AM  LOS: 25 days

## 2015-09-01 NOTE — Progress Notes (Signed)
This patient was increasingly irritated. Shouting profanities loud enough for other patients to hear. Patient was verbally aggressive to this RN and with other staff. Told this RN and NT that he had "a bullet for us". Patient agreed to take oral meds this evening for this RN but this nurse had to stay in the room and wait until "he said to take them" per patient while looking at the television the whole time. Patient wanted his legs to be straightened out. This RN spent 20 minutes trying to get this patient positioned. Patient continued to shout out. Paged on call triad (K Schorr), who gave a one time order of 5 mg valium IV. This seemed to calm him done somewhat but was also asking this RN to move the portable TV in front of the other portable TV; taking the doors and putting them outside; and was talking to his "friend" on the phone. Patient finally slept but woke up on occasion shouting out. Gave this patient some fentanyl for his pain. Will continue to monitor.

## 2015-09-01 NOTE — Care Management Note (Addendum)
Case Management Note  Patient Details  Name: Guinevere FerrariHarold Goodpasture MRN: [id number] Date of Birth: 03/09/1951  Subjective/Objective:     Admitted with sepsis               Action/Plan: Son Eldridge Abrahamshilip Woodrum has decided to take patient home to his [place] in [place]. Son states that patient will have some one with him 24/7 and stated that he would be willing to learn to give IV antibiotics. He is aware that home health nurses would not be staying with the patient but visiting to care for wound vac and teach IV antibiotics. Contacted [facility], they are not bale to work with patient. [facility]ed [facility], they are not able to work with patient. Contacted [facility] in [place], spoke with Kennon RoundsSally, they would be able to start service 09/03/15. Faxed demographics, order, face to face, H and P, op note, and progress note to [phone]. Contacted Pam at [facility], they will provide the IV antibiotics. Contacted Rickie at [facility] and faxed order, demographics, op note H and P, conformed she received it, she will contact me once determination is made. Contacted James at [facility] and requested hospital bed with air mattress be delivered to patient's son's home in [place],gave address and contact numbers to [place]. CM will continue to work on discharge needs.    Eldridge Abrahamshilip Souffrant [phone], cell [phone] [address], [place] [id number]     Expected Discharge Date:                  Expected Discharge Plan:  Home w Home Health Services  In-House Referral:  Clinical Social Work, Hospice / Palliative Care  Discharge planning Services  CM Consult  Post Acute Care Choice:  Durable Medical Equipment, Home Health Choice offered to:  Adult Children  DME Arranged:  Vac, Hospital bed, Air overlay mattress DME Agency:     HH Arranged:  RN, Social Work, Nurse's Aide HH Agency:     Status of Service:  In process, will continue to follow  Medicare Important Message Given:  Yes Date Medicare IM Given:     Medicare IM give by:    Date Additional Medicare IM Given:    Additional Medicare Important Message give by:     If discussed at Long Length of Stay Meetings, dates discussed:    Additional Comments:  Monica BectonKrieg, Fleet Higham Watson, RN 09/01/2015, 3:13 PM

## 2015-09-01 NOTE — Clinical Social Work Note (Addendum)
CSW, case manager, and palliative care, spoke with patient's family regarding discharge planning and SNF placement.  Patient's son Jeff Ferrell [phone] was at bedside.  CSW explained to patient's son that if patient goes to SNF he will be in copay days, and would have to pay right away.  CSW also explained to patient's son that there are SNFs in [place], that patient can go to if they want a SNF in [place].  CSW was given permission by patient's son to fax to SNFs in [place].  Patient's son was explained about LTACH for patient as a possiblity.  Patient's son is also considering having patient go home with home health.  CSW awaiting decision by patient's family, case manager aware.  CSW continuing to follow patient's progress.  Jeff Ferrell, MSW, Jeff Ferrell [phone] 09/01/2015 12:36 PM

## 2015-09-01 NOTE — Progress Notes (Signed)
Pharmacy Antibiotic Note Jeff Ferrell is a 65 y.o. male admitted on 08/07/2015 with MRSA bacteremia, R hip osteo, and multiple abscesses concerning for infection.  Now day #25 of abx for MRSA bacteremia w/ R hip osteo w/ hardware (s/p I&D 2/19; hip was repaired on 2/1). TEE neg for vegetations. Has had repeated I&D's with last one on 3/1.   Per ID: continue IV vancomycin and rifampin x 8 weeks postop (thru 09/18/15) then will need 10 months plus of oral suppressive abx.   Patient's renal fx has fluctuated along with his levels. VT today is supra-therapeutic at 29 on 1 gm IV Q 12 hours. UOP remains low at 0.3 mL/kg/hr Creatinine with slight trend up 3/11 -> 3/13.  I/O positive. Goals of care meeting planned for 09/01/15.  Plan:  Decrease vancomycin to 500 mg IV q12hrs and start at 0400 tomorrow   Continue rifampin 600mg  PO daily  Follow renal function, progress, plans.  Vanc trough weekly when stable.  Target Vanc troughs 15-20 mcg/ml    Height: 5\' 9"  (175.3 cm) Weight: 161 lb 2.5 oz (73.1 kg) IBW/kg (Calculated) : 70.7  Temp (24hrs), Avg:98.2 F (36.8 C), Min:98 F (36.7 C), Max:98.6 F (37 C)   Recent Labs Lab 08/26/15 1145 08/27/15 0443 08/28/15 0552 08/28/15 1455 08/30/15 0451 09/01/15 1510  WBC 10.3 8.6 7.8  --   --   --   CREATININE  --   --   --  0.94 1.02 0.90  VANCOTROUGH  --   --   --  10  --  29*    Estimated Creatinine Clearance: 80.7 mL/min (by C-G formula based on Cr of 0.9).    Allergies  Allergen Reactions  . Penicillins Hives and Rash    Has patient had a PCN reaction causing immediate rash, facial/tongue/throat swelling, SOB or lightheadedness with hypotension: Yes Has patient had a PCN reaction causing severe rash involving mucus membranes or skin necrosis: Yes   Has patient had a PCN reaction that required hospitalization No Has patient had a PCN reaction occurring within the last 10 years: No If all of the above answers are "NO", then may proceed  with Cephalosporin use.     Antimicrobials this admission: Azactam 2/18 >>2/19 Rocephin 2/19 >> 2/20 Vancomycin 2/18 >> Rifampin PO 2/19 >>  Dose adjustments this admission: 2/21: VT = 7 on 1gm q12h, dose increased to 1gm q8h 2/22: VT = 14 on 1gm q8h, dose increased to 1250mg  q8h 2/25: VT = 25 (1hr early) on 1250 q8h, dose changed to 1500mg  q12h 2/28: VT = 18 (on time) on 1500 mg q12h 3/6: VT = 47 1500 mg dose given after level drawn 3/8: VR = 13 (with no dose x ~41 hours)  3/11 VT= 10 on 1 gram Q 24H and SCr 0.9; dose changed to 1 gm IV q12hrs  Microbiology results: 2/18 blood x 2 - MRSA (S: Clinda, Rifampin, TCN, Vanc, MIC = 1, R: Septra, CIpro) 2/18 R leg wound - abundant MRSA 2/19 R leg wound - MRSA 2/19 urine -100k/ml MRSA 2/19 blood x 2 - 1/2 MRSA 2/19 R leg wound, anaerobic x 2 - no anaerobes isolated 2/19 MRSA PCR- POS -> CHG/Bactroban 2/19-20>>2/24 2/20 Blood x 2 - negative F 2/24 left scapula abscess - negative 2/28 C diff neg 3/3 blood x 2 - neg 3/3 urine - neg 3/6 blood x 2 - neg 3/14 urine -  Vinnie LevelBenjamin Lynnwood Ferrell, PharmD., BCPS Clinical Pharmacist Pager [phone]

## 2015-09-01 NOTE — Progress Notes (Signed)
[facility]  Patient Status: [facility]  [facility] is providing the following services: Home Infusion Pharmacy services for home IV ABX.  [facility] partner with [facility] who will provide in home RN/HH services. [facility] has spoken by phone with Loistine ChancePhilip, pts son, to share POC for home IV ABX. [facility] Infusion Coordinator will meet with son to provide in hospital face to face teaching if possible to support independence at home.  Son is agreeable to learning IV ABX administration for home. Visit to patients room late afternoon, but pt sleeping and no family was present. Per patients RN, pt is not coherent to answer any questions. I will f/u with the son Loistine Chancehilip tomorrow.  If patient discharges after hours, please call [phone].   Sedalia Mutaamela S Chandler 09/01/2015, 10:11 PM

## 2015-09-02 LAB — GLUCOSE, CAPILLARY: Glucose-Capillary: 132 mg/dL — ABNORMAL HIGH (ref 65–99)

## 2015-09-02 LAB — URINE CULTURE

## 2015-09-02 MED ORDER — VANCOMYCIN HCL 500 MG IV SOLR: 500.0000 mg | Freq: Two times a day (BID) | INTRAVENOUS | Status: AC

## 2015-09-02 NOTE — Progress Notes (Addendum)
Palliative Medicine Team Family Meeting/Goals of Care  Scheduled meeting for 10AM this morning.  Present for Meeting:  Son: Aldine Grainger Son: Rob Hickman Mikolajczak Relative: Denton Ar was unavailabe to come to the meeting. Family requested meeting be held in the room with the patient who was already agitated and confused.   I met in the room with the patient and his two sons along with another family member who was video recording the visit on her iphone. Very difficult encounter. I began meeting by trying to attempt to demonstrate patient's capacity-he would nopt answer basic questions including his own name, location or identify his sons in the room. He could not tell me what was wrong with him - he used mostly profanity when speaking and on multiple occasions instructed me to "be quite and "shut my mouth", Doren Custard then instructed me to listen to his father-I listened patiently and intently for a significant period of time during which a conversation occurred between Sharon and his father with no complete sentence or specific situation begin discussed- clearly Mr. Kiger was confused, paranoid and talking about things that were completely unrelated to his care-I am not certain at any point [place] or [name] recognized this. Doren Custard continued to only speak about the fact that when he visited on March 11 that his father had not been fed and his father was thirsty and his lips were so dry they were "black". Despite reassurance that his father was being fed and offered food and hydration and well as receiving IV fluids he simply would not allow the conversation to move past this. I provided and allowed for him to tell his story and express his concerns but he would not allow me to give my perspective or discuss the patient's medical condition without interrupting me. At one point in our meeting [name] the other son said to phillip and his father- you guys haven't said anything and I would like to hear from  the doctor about his condition- at that point the patient told Doren Custard not to listen to anything that I had to say using multiple profanities. At this point I explained that my presence and our conversation was agitating their father and excused my self from the room. I offered to speak outside the room and they refused. I requested that they make a choice for his discharge: [facility], [facility] or if Doren Custard wanted to take his father home with him we could set-up services however I strongly advised against this given his fathers 24/7 needs.   Patient in front of his family refused all of the medications that nursing brought into him during the visit and they made no attempt to try to help with this either. They were upset about the mitts-I explain that he had attempted to pull out his foley catheter and caused trauma to his penis as a result of not having mitts on and that they were necessary in the hospital to maintain his PICC line and foley- without the foley he will obstruct again. Family have little or no concept of how seriously ill their father is or the level of care required for him.   I was able to explain to one son [name] outside the room that his father had multiple rib fractures, left and right hip fractures, a serious infection, intractable pain and significant delirium- [name] said he had "no idea" and told me he didn't feel well and needed to excuse himself from the room and he left the hospital.  After much discussion and phone calls- the family have decided they want to take him home to [place] with home health services. Care Management and CSW assisting today.  Family would not allow for discussion about goals of care, advanced directives or trajectory of illness.   Time: 10AM-12PM Total Time: 120 minutes Greater than 50%  of this time was spent counseling and coordinating care related to the above assessment and plan.  Lane Hacker, DO Palliative  Medicine [phone]

## 2015-09-02 NOTE — Care Management (Signed)
Case manager has faxed information to [facility]enn[facility]isch[facility] , [facility] , fax: [phone]. They are arranging for IV antibiotics and RN for administration and PICC line care. CM is waiting for a return call to confirm that agency is in place.

## 2015-09-02 NOTE — Progress Notes (Signed)
Patient has been combative and trying to get up OOB without assistance.  He tried to bite staff; hit staff, and used multiple curse words at staff when trying to provide care.  Gave Valium 5 mg iv around 2100 on 09/01/15.  Medication was effective in getting patient to calm down.  Went in to give evening medications, first patient stated he would take the pills, once I scanned and opened all meds, patient refused.  Wasted meds with charge nurse at pyxis.  MD, should consider all meds for iv route.

## 2015-09-02 NOTE — Discharge Summary (Addendum)
Discharge Summary  Jeff Ferrell ZOX:[id number] DOB: 1951/01/23  PCP: Jeff Floro, MD  Admit date: 08/07/2015 Anticipated Discharge date: 09/03/2015  Time spent: 25 minutes   Recommendations for Outpatient Follow-up:  1. New medication: IV vancomycin to be managed and dosed by home health 2. New medication: Rifampin 600 mg by mouth daily 3. The following medications have been discontinued: Colace, Neurontin, Lovenox, Januvia, Robaxin, Senokot, Percocet, OxyIR 4. New medications: Fentanyl patch 75 g topically every 3 days, baclofen 20 mg by mouth 3 times a day, Zyprexa 5 mg by mouth twice a day, Valium 5 mg by mouth twice a day, Lyrica 100 mg by mouth twice a day 5. Patient will be followed by home health RN for wound VAC 3 times a week, home health aide and social work  Discharge Diagnoses:  Active Hospital Problems   Diagnosis Date Noted  . Fever 08/23/2015  . Enlarged prostate with urinary obstruction 09/01/2015  . ETOH abuse 08/26/2015  . Constipation, acute 08/26/2015  . 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 Intractable pain 08/26/2015  . Encephalopathy acute   . Diabetic foot ulcer (HCC)   . Pleural effusion   . Gluteal abscess   . Chest wall abscess   . Low back pain   . Muscle spasms of both lower extremities   . Hypokalemia   . Acute pyelonephritis   . Uncontrolled type 2 diabetes mellitus with complication (HCC)   . Acute urinary retention   . Sepsis due to methicillin resistant Staphylococcus aureus (MRSA) (HCC)   . Hip fracture requiring operative repair (HCC)   . MRSA bacteremia   . Staphylococcus aureus bacteremia with sepsis (HCC)   . Osteomyelitis of right hip (HCC)   . Hardware complicating wound infection (HCC)   . Infection 08/08/2015  . DM type 2, goal HbA1c < 7% (HCC) 08/07/2015  . Chronic hyponatremia 08/07/2015  . Fracture of femoral neck, right, closed 07/19/2015  . Left-sided chest wall pain   . Femur fracture, left (HCC)  06/17/2015  . Discitis of lumbar region     Resolved Hospital Problems   Diagnosis Date Noted Date Resolved  . Hyponatremia 06/20/2015 09/01/2015  . Left hip pain  09/01/2015  . 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 At that time, also noted to have a urinary tract infection. Patient seen by orthopedic surgery and underwent irrigation and debridement of hip on 2/19. Cultures as well as blood cultures positive for MRSA. Infectious disease consulted. Patient has been on vancomycin since. Cardiology consulted for TEE which was negative as were repeat blood cultures.   On 2/24, patient underwent aspiration of left chest wall. Plan was for long-term IV antibiotics with PICC line placed on 2/28. 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 He started spiking fevers on 3/3 and infectious disease reconsulted.  Other complications during hospitalization include severe urinary retention requiring Foley catheter, blood transfusions on 2/20 and 3/1 and most significantly, severe pain that has been extremely difficult to control or oversedation. Palliative care consulted on 3/7.  D/w niece & sister & they highlight heavy beer drinking which would account for his falls & broken bones.   Had an extensive discussion with patient's son on 3/10 who lives in [place] and has not seen patient since admission. Patient's son initially angry thinking that we are trying to give him medication to keep him sedated. I assured him that is not the case. Covered comprehensive history of the patient prior to this admission and including what has been going on this admission. It is the son's goal to try to take the patient home with him on IV antibiotics at some point.   Around 3/10, patient was more lucid on fentanyl patch alone and responded well to starting of baclofen on 3/11 for spasms. However this is been intermittent. And for the next few days following, much more confused and at times aggressive. Palliative care at a goals of care meeting with the patient's sons.  One of the patient's sons has been quite hostile and aggressive and after extensive debating back and forth, the patient's son wants to take the patient home with home health and try to manage his father's care at home. Despite concerns from other family members and from multiple physicians, patient's son is insistent.  Plan will be the patient will go home with wound VAC, hospital bed, IV antibiotics and home health follow-up.   Acute on chronic encephalopathy: Suspect that there may be some underlying chronic issues associated with patient's long-term drinking and now with prolonged hospitalization/ illness and infection.  Heavy alcohol abuse: out of window for withdrawals. This would account for his falls & multiple fractures   Consultants:  Infectious Disease  IR  Orthopedics  Palliative Care  Cardiology  Procedures: 2/19 - I&D infected R hip wound  2/20 1 units packed red blood cells transfused 2/24 TEE EF= 60% to 65%. 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 If you have any questions about your discharge medications or the care you received while you were in the hospital after you are discharged, you can call the unit and asked to speak with the hospitalist on call if the hospitalist that took care of you is not available. Once you are discharged, your primary care physician will handle any further medical issues. Please note that NO  REFILLS for any discharge medications will be authorized once you are discharged, as it is imperative that you return to your primary care physician (or establish a relationship with a primary care physician if you do not have one) for your aftercare needs so that they can reassess your need for medications and monitor your lab values.     Medication List    STOP taking these medications        docusate sodium 100 MG capsule  Commonly known as:  COLACE     enoxaparin 40 MG/0.4ML injection  Commonly known as:  LOVENOX     gabapentin 100 MG capsule  Commonly known as:  NEURONTIN     magnesium hydroxide 400 MG/5ML suspension  Commonly known as:  MILK OF MAGNESIA     methocarbamol 500 MG tablet  Commonly known as:  ROBAXIN      oxyCODONE 5 MG immediate release tablet  Commonly known as:  Oxy IR/ROXICODONE     oxyCODONE-acetaminophen 5-325 MG tablet  Commonly known as:  PERCOCET     sennosides-docusate sodium 8.6-50 MG tablet  Commonly known as:  SENOKOT-S     sitaGLIPtin 50 MG tablet  Commonly known as:  JANUVIA      TAKE these medications        acetaminophen 325 MG tablet  Commonly known as:  TYLENOL  Take 2 tablets (650 mg total) by mouth every 6 (six) hours as needed for mild pain (or Fever >/= 101).     aspirin 325 MG EC tablet  Take 1 tablet (325 mg total) by mouth daily.     baclofen 20 MG tablet  Commonly known as:  LIORESAL  Take 1 tablet (20 mg total) by mouth 3 (three) times daily.     bisacodyl 5 MG EC tablet  Commonly known as:  DULCOLAX  Take 2 tablets (10 mg total) by mouth daily.     diazepam 5 MG tablet  Commonly known as:  VALIUM  Take 1 tablet (5 mg total) by mouth 2 (two) times daily.     feeding supplement (ENSURE ENLIVE) Liqd  Take 237 mLs by mouth 3 (three) times daily between meals.     feeding supplement (PRO-STAT SUGAR FREE 64) Liqd  Take 30 mLs by mouth 2 (two) times daily.     fentaNYL 75 MCG/HR  Commonly known as:  DURAGESIC - dosed mcg/hr  Place 1 patch (75 mcg total) onto the skin every 3 (three) days.     OLANZapine 5 MG tablet  Commonly known as:  ZYPREXA  Take 1 tablet (5 mg total) by mouth at bedtime.     polyethylene glycol packet  Commonly known as:  MIRALAX / GLYCOLAX  Take 17 g by mouth daily.     pregabalin 100 MG capsule  Commonly known as:  LYRICA  Take 1 capsule (100 mg total) by mouth 2 (two) times daily.     rifampin 300 MG capsule  Commonly known as:  RIFADIN  Take 2 capsules (600 mg total) by mouth daily.     tamsulosin 0.4 MG Caps capsule  Commonly known as:  FLOMAX  Take 1 capsule (0.4 mg total) by mouth daily.     vancomycin 500 mg in sodium chloride 0.9 % 100 mL  Inject 500 mg into the vein every 12 (twelve) hours.        Allergies  Allergen Reactions  . Penicillins Hives and Rash    Has patient had a PCN reaction causing  immediate rash, facial/tongue/throat swelling, SOB or lightheadedness with hypotension: Yes Has patient had a PCN reaction causing severe rash involving mucus membranes or skin necrosis: Yes   Has patient had a PCN reaction that required hospitalization No Has patient had a PCN reaction occurring within the last 10 years: No If all of the above answers are "NO", then may proceed with Cephalosporin use.    Follow-up Information    Follow up with Cheral Almas, MD In 2 weeks.   Specialty:  Orthopedic Surgery   Why:  For wound re-check   Contact information:   [address] [place] [address] [phone]        The results of significant diagnostics from this hospitalization (including imaging, microbiology, ancillary and laboratory) are listed below for reference.    Significant Diagnostic Studies: Dg Chest 2 View  08/15/2015  CLINICAL DATA:  Check for possible effusion EXAM: CHEST  2 VIEW COMPARISON:  08/14/2015 FINDINGS: Cardiac shadow is within normal limits. The lungs are well-aerated bilaterally. Small pleural effusions are noted on the lateral projection likely too small for significant thoracentesis. No focal infiltrate is noted. IMPRESSION: Small pleural effusions bilaterally likely too small for adequate thoracentesis. No focal infiltrate is noted. Electronically Signed   By: Alcide Clever M.D.   On: 08/15/2015 20:01   Ct Head Wo Contrast  08/24/2015  CLINICAL DATA:  Acute encephalopathy.  The patient is unresponsive. EXAM: CT HEAD WITHOUT CONTRAST TECHNIQUE: Contiguous axial images were obtained from the base of the skull through the vertex without intravenous contrast. COMPARISON:  None. FINDINGS: There is chronic diffuse atrophy. There is no midline shift, hydrocephalus, or mass. No acute hemorrhage or acute transcortical infarct is identified. The bony  calvarium is intact. There is mucoperiosteal thickening of the right maxillary sinus. IMPRESSION: No focal acute intracranial abnormality identified. Chronic diffuse atrophy. Electronically Signed   By: Sherian Rein M.D.   On: 08/24/2015 13:42   Mr Thoracic Spine W Wo Contrast  08/12/2015  ADDENDUM REPORT: 08/12/2015 13:50 ADDENDUM: Study discussed by telephone with Dr. Gwen Her Dam On 08/12/2015 at 1345 hours. Electronically Signed   By: Odessa Fleming M.D.   On: 08/12/2015 13:50  08/12/2015  CLINICAL DATA:  65 year old male with recent hip surgery, became septic, and now with new onset back pain. Query discitis osteomyelitis. Indeterminate signal changes of the L5-S1 disc space since December. Initial encounter. Left rib fractures detected on portable chest x-ray in January. EXAM: MRI THORACIC AND LUMBAR SPINE WITHOUT AND WITH CONTRAST TECHNIQUE: Multiplanar and multiecho pulse sequences of the thoracic and lumbar spine were obtained without and with intravenous contrast. CONTRAST:  15mL MULTIHANCE GADOBENATE DIMEGLUMINE 529 MG/ML IV SOLN COMPARISON:  Hip intraoperative images 2117. Lumbar MRI 07/01/2015 and earlier. Thoracic and lumbar spine radiographs 11/24/2010. FINDINGS: MR THORACIC SPINE FINDINGS Limited sagittal imaging of the cervical spine is remarkable for diffuse disc degeneration with what appears to be at least moderate degenerative cervical spinal stenosis at C2-C3 and C3-C4 (series 2, image 6). Left greater than right layering pleural effusions with confluent abnormal increased signal in both lower lobes. Multilevel (at least 5 levels) displaced left posterior rib fractures re- demonstrated (series 7, image 14), and there is an associated rim enhancing fluid collection in the intercostal muscles measuring about 40 mm in length and 10 mm in width (series 22, image 15 and series 7, image 15). This appears to be a just deep to the left inferior scapula. Other posterior paraspinal soft tissues are  within  normal limits. Preserved thoracic vertebral height and alignment. T2 and T11 vertebral body benign hemangiomas. No thoracic vertebral marrow edema or evidence of acute osseous abnormality. No inflammatory signal of the thoracic intervertebral discs. Degenerative multifactorial mild thoracic spinal stenosis from T3-T4 through T9-T10. Mild if any associated thoracic spinal cord mass effect. Spinal cord signal is within normal limits at all visualized levels. No dural thickening or enhancement. No abnormal intradural enhancement. The conus medullaris appears normal at T12-L1. MR LUMBAR SPINE FINDINGS Same numbering system as on the recent comparison lumbar MRIs. 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 Signal to noise in the lower lumbar levels is poor today, resulting in partial obscuration of the L3 through L5 vertebrae and thecal sac. The urinary bladder is severely distended. There is secondary hydronephrosis. There is edema in the right gluteus muscle immediately posterior to the right iliac wing (series 17, image 34) with an associated small 2 cm mildly heterogeneous rim enhancing fluid collection (series 20, image 34). Otherwise postoperative changes to the lumbar posterior paraspinal soft tissues appear stable. Allowing for chronic hardware susceptibility artifact and the suboptimal signal to noise in the lumbar spine today, there is no acute osseous abnormality or interval change in the appearance of the lumbar disc spaces. There is chronic adjacent segment disease with mild to moderate multifactorial spinal stenosis at L1-L2. The conus medullaris appears normal at T12-L1. No abnormal intradural enhancement is evident. IMPRESSION: 1. Massively distended urinary bladder, causing secondary hydronephrosis. Recommend bladder decompression with Foley catheter. 2. No evidence of acute thoracic or lumbar spinal infection. Suboptimal MRI visualization of the lumbar spine today. 3. Rim  enhancing fluid collections in the posterior left chest wall fluid - associated with multiple displaced rib fractures - and in the right gluteal muscle fluid collection are suspicious for phlegmon/abscess in this setting. 4. Left greater than right pleural effusions with bilateral lower lobe pneumonia. 5. Degenerative thoracic spinal stenosis from T3-T4 to T9-T10. Stable degenerative lumbar spinal stenosis at L1-L2. At least moderate degenerative cervical spinal stenosis at C2-C3 and C3-C4. Electronically Signed: By: Odessa FlemingH  Hall M.D. On: 08/12/2015 13:32   Mr Lumbar Spine W Wo Contrast  08/12/2015  ADDENDUM REPORT: 08/12/2015 13:50 ADDENDUM: Study discussed by telephone with Dr. Gwen HerKees Van Dam On 08/12/2015 at 1345 hours. Electronically Signed   By: Odessa FlemingH  Hall M.D.   On: 08/12/2015 13:50  08/12/2015  CLINICAL DATA:  65 year old male with recent hip surgery, became septic, and now with new onset back pain. Query discitis osteomyelitis. Indeterminate signal changes of the L5-S1 disc space since December. Initial encounter. Left rib fractures detected on portable chest x-ray in January. EXAM: MRI THORACIC AND LUMBAR SPINE WITHOUT AND WITH CONTRAST TECHNIQUE: Multiplanar and multiecho pulse sequences of the thoracic and lumbar spine were obtained without and with intravenous contrast. CONTRAST:  15mL MULTIHANCE GADOBENATE DIMEGLUMINE 529 MG/ML IV SOLN COMPARISON:  Hip intraoperative images 2117. Lumbar MRI 07/01/2015 and earlier. Thoracic and lumbar spine radiographs 11/24/2010. FINDINGS: MR THORACIC SPINE FINDINGS Limited sagittal imaging of the cervical spine is remarkable for diffuse disc degeneration with what appears to be at least moderate degenerative cervical spinal stenosis at C2-C3 and C3-C4 (series 2, image 6). Left greater than right layering pleural effusions with confluent abnormal increased signal in both lower lobes. Multilevel (at least 5 levels) displaced left posterior rib fractures re- demonstrated (series  7, image 14), and there is an associated rim enhancing fluid collection in the intercostal muscles measuring about  40 mm in length and 10 mm in width (series 22, image 15 and series 7, image 15). This appears to be a just deep to the left inferior scapula. Other posterior paraspinal soft tissues are within normal limits. Preserved thoracic vertebral height and alignment. T2 and T11 vertebral body benign hemangiomas. No thoracic vertebral marrow edema or evidence of acute osseous abnormality. No inflammatory signal of the thoracic intervertebral discs. Degenerative multifactorial mild thoracic spinal stenosis from T3-T4 through T9-T10. Mild if any associated thoracic spinal cord mass effect. Spinal cord signal is within normal limits at all visualized levels. No dural thickening or enhancement. No abnormal intradural enhancement. The conus medullaris appears normal at T12-L1. MR LUMBAR SPINE FINDINGS Same numbering system as on the recent comparison lumbar MRIs. 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 Signal to noise in the lower lumbar levels is poor today, resulting in partial obscuration of the L3 through L5 vertebrae and thecal sac. The urinary bladder is severely distended. There is secondary hydronephrosis. There is edema in the right gluteus muscle immediately posterior to the right iliac wing (series 17, image 34) with an associated small 2 cm mildly heterogeneous rim enhancing fluid collection (series 20, image 34). Otherwise postoperative changes to the lumbar posterior paraspinal soft tissues appear stable. Allowing for chronic hardware susceptibility artifact and the suboptimal signal to noise in the lumbar spine today, there is no acute osseous abnormality or interval change in the appearance of the lumbar disc spaces. There is chronic adjacent segment disease with mild to moderate multifactorial spinal stenosis at L1-L2. The conus medullaris appears normal at T12-L1. No  abnormal intradural enhancement is evident. IMPRESSION: 1. Massively distended urinary bladder, causing secondary hydronephrosis. Recommend bladder decompression with Foley catheter. 2. No evidence of acute thoracic or lumbar spinal infection. Suboptimal MRI visualization of the lumbar spine today. 3. Rim enhancing fluid collections in the posterior left chest wall fluid - associated with multiple displaced rib fractures - and in the right gluteal muscle fluid collection are suspicious for phlegmon/abscess in this setting. 4. Left greater than right pleural effusions with bilateral lower lobe pneumonia. 5. Degenerative thoracic spinal stenosis from T3-T4 to T9-T10. Stable degenerative lumbar spinal stenosis at L1-L2. At least moderate degenerative cervical spinal stenosis at C2-C3 and C3-C4. Electronically Signed: By: Odessa Fleming M.D. On: 08/12/2015 13:32   Ir Veno/ext/uni Right  08/16/2015  INDICATION: DIABETIC FOOT ULCER, ACCESS FOR LONG-TERM ANTIBIOTICS EXAM: ULTRASOUND GUIDANCE FOR VASCULAR ACCESS RIGHT UPPER EXTREMITY VENOGRAM LEFT UPPER EXTREMITY PICC LINE PLACEMENT WITH ULTRASOUND AND FLUOROSCOPIC GUIDANCE MEDICATIONS: 1% LIDOCAINE LOCALLY ANESTHESIA/SEDATION: NONE. The patient's level of consciousness and vital signs were monitored continuously by radiology nursing throughout the procedure under my direct supervision. FLUOROSCOPY TIME:  Fluoroscopy Time: 3 minutes 30 seconds (17 mGy). COMPLICATIONS: None immediate. PROCEDURE: The patient was advised of the possible risks and complications and agreed to undergo the procedure. The patient was then brought to the angiographic suite for the procedure. Ultrasound guidance for vascular access and right upper extremity venogram: Initially under sterile conditions and local anesthesia, right basilic micropuncture venous access performed with ultrasound. Images were obtained for documentation. Basilic vein was demonstrated to be patent. Guidewire would not advance  centrally for accurate measurements for a PICC line length. Five French sheath inserted. Contrast injection performed for central right upper extremity venogram. Right upper extremity venogram: Right brachial and axillary veins are patent. 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  Jugular collaterals are present opacifying the patent right innominate vein and SVC. Several attempts were made to cross the right subclavian venous occlusion with a Kumpe catheter and guidewire however this was unsuccessful. Therefore this access was aborted for PICC line placement. Left arm will be utilized. Left upper extremity PICC line insertion with ultrasound guidance: The left arm was prepped with chlorhexidine, draped in the usual sterile fashion using maximum barrier technique (cap and mask, sterile gown, sterile gloves, large sterile sheet, hand hygiene and cutaneous antisepsis) and infiltrated locally with 1% Lidocaine. Ultrasound demonstrated patency of the left basilic vein, and this was documented with an image. Under real-time ultrasound guidance, this vein was accessed with a 21 gauge micropuncture needle and image documentation was performed. A 0.018 wire was introduced in to the vein. Over this, a 5 [place] single lumen power PICC was advanced to the lower SVC/right atrial junction. Fluoroscopy during the procedure and fluoro spot radiograph confirms appropriate catheter position. The catheter was flushed and covered with asterile dressing. Catheter length: None immediate IMPRESSION: Initial attempts at placement of a right PICC line were unsuccessful related to difficulty pass the guidewire. 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 Successful left arm power PICC line placement with ultrasound and fluoroscopic guidance. The catheter is ready for use. Electronically Signed   By: Judie Petit.  Shick M.D.   On: 08/16/2015 17:24     Ct Hip Right Wo Contrast  08/07/2015  CLINICAL DATA:  Infection. Patient post right hip surgery for right femoral neck fracture 07/21/2015, now with drainage from wound and fever and right hip pain. EXAM: CT OF THE RIGHT HIP WITHOUT CONTRAST TECHNIQUE: Multidetector CT imaging of the right hip was performed according to the standard protocol. Multiplanar CT image reconstructions were also generated. COMPARISON:  Right hip CT 07/20/2015 FINDINGS: Femoral nail with lag screw fixation of comminuted right femoral neck fracture. No abnormal lucency about the included hardware. Improved fracture alignment compared to preoperative radiograph. Questionable minimal interval fracture healing with some periosteal new bone about the lesser trochanter. There is skin thickening and diffuse subcutaneous edema laterally with skin staples in place. Small curvilinear fluid collection just superficial to the vastus lateralis musculature, with indistinct muscle planes laterally. This fluid collection measures up to 12 mm in depth. Heterogeneous enlargement of right gluteus medius musculature with some internal densities, likely sequela of IM nail. Heterogeneous fluid adjacent to the periosteal new bone about the lesser trochanter. Questionable tiny foci of air in the operative bed, axial image 52 and 39. Limited assessment for effusion secondary to streak artifact from surgical hardware. Enlarged right external iliac and inguinal lymph nodes. Diffuse urinary bladder wall thickening. Pelvic and sacral hardware is partially included. IMPRESSION: 1. Post recent fixation of right femoral neck fracture. Diffuse inflammatory change in the subcutaneous tissues most prominent laterally, with small crescentic fluid collection superficial to the vastus lateralis measuring up to 12 mm. Abscess versus hematoma, abscess favored in the setting of fever and infection. 2. Heterogeneous enlargement of the right gluteus medius musculature, may  reflect postoperative hematoma versus infection. 3. Right inguinal and external iliac adenopathy, likely reactive. 4. Diffuse urinary bladder wall thickening, most consistent with urinary tract infection. Electronically Signed   By: Rubye Oaks M.D.   On: 08/07/2015 18:32   Ct Aspiration  08/13/2015  CLINICAL DATA:  Posterior left subscapular fluid collection adjacent to rib fractures. History of MRSA bacteremia EXAM: CT GUIDANCE NEEDLE PLACEMENT FLUOROSCOPY TIME:  None  MEDICATIONS AND MEDICAL HISTORY: Versed 2 mg, Fentanyl 150 mcg. Additional Medications: None. ANESTHESIA/SEDATION: Moderate sedation time: 12 minutes CONTRAST:  None PROCEDURE: The procedure, risks, benefits, and alternatives were explained to the patient. Questions regarding the procedure were encouraged and answered. The patient understands and consents to the procedure. The left posterior thorax was prepped with ChloraPrep in a sterile fashion, and a sterile drape was applied covering the operative field. A sterile gown and sterile gloves were used for the procedure. Under CT guidance, an 18 gauge needle was inserted into the subscapular fluid collection. 3 cc serosanguineous fluid was aspirated and sent for culture. FINDINGS: Imaging documents needle placement in the subcapsular fluid collection. Post aspiration imaging demonstrates no pneumothorax or hemorrhage. COMPLICATIONS: None IMPRESSION: Successful left subscapular fluid aspiration yielding 3 cc serosanguineous fluid. Electronically Signed   By: Jolaine Click M.D.   On: 08/13/2015 13:37   Ir Fluoro Guide Cv Line Left  08/16/2015  INDICATION: DIABETIC FOOT ULCER, ACCESS FOR LONG-TERM ANTIBIOTICS EXAM: ULTRASOUND GUIDANCE FOR VASCULAR ACCESS RIGHT UPPER EXTREMITY VENOGRAM LEFT UPPER EXTREMITY PICC LINE PLACEMENT WITH ULTRASOUND AND FLUOROSCOPIC GUIDANCE MEDICATIONS: 1% LIDOCAINE LOCALLY ANESTHESIA/SEDATION: NONE. The patient's level of consciousness and vital signs were monitored  continuously by radiology nursing throughout the procedure under my direct supervision. FLUOROSCOPY TIME:  Fluoroscopy Time: 3 minutes 30 seconds (17 mGy). COMPLICATIONS: None immediate. PROCEDURE: The patient was advised of the possible risks and complications and agreed to undergo the procedure. The patient was then brought to the angiographic suite for the procedure. Ultrasound guidance for vascular access and right upper extremity venogram: Initially under sterile conditions and local anesthesia, right basilic micropuncture venous access performed with ultrasound. Images were obtained for documentation. Basilic vein was demonstrated to be patent. Guidewire would not advance centrally for accurate measurements for a PICC line length. Five French sheath inserted. Contrast injection performed for central right upper extremity venogram. Right upper extremity venogram: Right brachial and axillary veins are patent. 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 Jugular collaterals are present opacifying the patent right innominate vein and SVC. Several attempts were made to cross the right subclavian venous occlusion with a Kumpe catheter and guidewire however this was unsuccessful. Therefore this access was aborted for PICC line placement. Left arm will be utilized. Left upper extremity PICC line insertion with ultrasound guidance: The left arm was prepped with chlorhexidine, draped in the usual sterile fashion using maximum barrier technique (cap and mask, sterile gown, sterile gloves, large sterile sheet, hand hygiene and cutaneous antisepsis) and infiltrated locally with 1% Lidocaine. Ultrasound demonstrated patency of the left basilic vein, and this was documented with an image. Under real-time ultrasound guidance, this vein was accessed with a 21 gauge micropuncture needle and image documentation was performed. A 0.018 wire was introduced in to the vein. Over this,  a 5 [place] single lumen power PICC was advanced to the lower SVC/right atrial junction. Fluoroscopy during the procedure and fluoro spot radiograph confirms appropriate catheter position. The catheter was flushed and covered with asterile dressing. Catheter length: None immediate IMPRESSION: Initial attempts at placement of a right PICC line were unsuccessful related to difficulty pass the guidewire. 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 Successful left arm power PICC line placement with ultrasound and fluoroscopic guidance. The catheter is ready for use. Electronically Signed   By: Judie Petit.  Shick M.D.   On: 08/16/2015 17:24   Ir US Guide  Vasc Access Left  08/16/2015  INDICATION: DIABETIC FOOT ULCER, ACCESS FOR LONG-TERM ANTIBIOTICS EXAM: ULTRASOUND GUIDANCE FOR VASCULAR ACCESS RIGHT UPPER EXTREMITY VENOGRAM LEFT UPPER EXTREMITY PICC LINE PLACEMENT WITH ULTRASOUND AND FLUOROSCOPIC GUIDANCE MEDICATIONS: 1% LIDOCAINE LOCALLY ANESTHESIA/SEDATION: NONE. The patient's level of consciousness and vital signs were monitored continuously by radiology nursing throughout the procedure under my direct supervision. FLUOROSCOPY TIME:  Fluoroscopy Time: 3 minutes 30 seconds (17 mGy). COMPLICATIONS: None immediate. PROCEDURE: The patient was advised of the possible risks and complications and agreed to undergo the procedure. The patient was then brought to the angiographic suite for the procedure. Ultrasound guidance for vascular access and right upper extremity venogram: Initially under sterile conditions and local anesthesia, right basilic micropuncture venous access performed with ultrasound. Images were obtained for documentation. Basilic vein was demonstrated to be patent. Guidewire would not advance centrally for accurate measurements for a PICC line length. Five French sheath inserted. Contrast injection performed for central right upper extremity venogram. Right upper  extremity venogram: Right brachial and axillary veins are patent. 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 Jugular collaterals are present opacifying the patent right innominate vein and SVC. Several attempts were made to cross the right subclavian venous occlusion with a Kumpe catheter and guidewire however this was unsuccessful. Therefore this access was aborted for PICC line placement. Left arm will be utilized. Left upper extremity PICC line insertion with ultrasound guidance: The left arm was prepped with chlorhexidine, draped in the usual sterile fashion using maximum barrier technique (cap and mask, sterile gown, sterile gloves, large sterile sheet, hand hygiene and cutaneous antisepsis) and infiltrated locally with 1% Lidocaine. Ultrasound demonstrated patency of the left basilic vein, and this was documented with an image. Under real-time ultrasound guidance, this vein was accessed with a 21 gauge micropuncture needle and image documentation was performed. A 0.018 wire was introduced in to the vein. Over this, a 5 [place] single lumen power PICC was advanced to the lower SVC/right atrial junction. Fluoroscopy during the procedure and fluoro spot radiograph confirms appropriate catheter position. The catheter was flushed and covered with asterile dressing. Catheter length: None immediate IMPRESSION: Initial attempts at placement of a right PICC line were unsuccessful related to difficulty pass the guidewire. 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 Successful left arm power PICC line placement with ultrasound and fluoroscopic guidance. The catheter is ready for use. Electronically Signed   By: Judie Petit.  Shick M.D.   On: 08/16/2015 17:24   Ir US Guide Vasc Access Right  08/16/2015  INDICATION: DIABETIC FOOT ULCER, ACCESS FOR LONG-TERM ANTIBIOTICS EXAM: ULTRASOUND GUIDANCE FOR VASCULAR ACCESS RIGHT UPPER  EXTREMITY VENOGRAM LEFT UPPER EXTREMITY PICC LINE PLACEMENT WITH ULTRASOUND AND FLUOROSCOPIC GUIDANCE MEDICATIONS: 1% LIDOCAINE LOCALLY ANESTHESIA/SEDATION: NONE. The patient's level of consciousness and vital signs were monitored continuously by radiology nursing throughout the procedure under my direct supervision. FLUOROSCOPY TIME:  Fluoroscopy Time: 3 minutes 30 seconds (17 mGy). COMPLICATIONS: None immediate. PROCEDURE: The patient was advised of the possible risks and complications and agreed to undergo the procedure. The patient was then brought to the angiographic suite for the procedure. Ultrasound guidance for vascular access and right upper extremity venogram: Initially under sterile conditions and local anesthesia, right basilic micropuncture venous access performed with ultrasound. Images were obtained for documentation. Basilic vein was demonstrated to be patent. Guidewire would not advance centrally for accurate measurements for a PICC line length. Five [place]  sheath inserted. Contrast injection performed for central right upper extremity venogram. Right upper extremity venogram: Right brachial and axillary veins are patent. 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 Jugular collaterals are present opacifying the patent right innominate vein and SVC. Several attempts were made to cross the right subclavian venous occlusion with a Kumpe catheter and guidewire however this was unsuccessful. Therefore this access was aborted for PICC line placement. Left arm will be utilized. Left upper extremity PICC line insertion with ultrasound guidance: The left arm was prepped with chlorhexidine, draped in the usual sterile fashion using maximum barrier technique (cap and mask, sterile gown, sterile gloves, large sterile sheet, hand hygiene and cutaneous antisepsis) and infiltrated locally with 1% Lidocaine. Ultrasound demonstrated patency of the left basilic vein,  and this was documented with an image. Under real-time ultrasound guidance, this vein was accessed with a 21 gauge micropuncture needle and image documentation was performed. A 0.018 wire was introduced in to the vein. Over this, a 5 [place] single lumen power PICC was advanced to the lower SVC/right atrial junction. Fluoroscopy during the procedure and fluoro spot radiograph confirms appropriate catheter position. The catheter was flushed and covered with asterile dressing. Catheter length: None immediate IMPRESSION: Initial attempts at placement of a right PICC line were unsuccessful related to difficulty pass the guidewire. 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 Successful left arm power PICC line placement with ultrasound and fluoroscopic guidance. The catheter is ready for use. Electronically Signed   By: Judie Petit.  Shick M.D.   On: 08/16/2015 17:24   Dg Chest Port 1 View  08/23/2015  CLINICAL DATA:  Fever. EXAM: PORTABLE CHEST 1 VIEW COMPARISON:  08/15/2015 FINDINGS: Shallow inspiration. Atelectasis in the lung bases. Normal heart size and pulmonary vascularity. No focal consolidation in the lungs. No blunting of costophrenic angles. No pneumothorax. Nodular opacity projected over the left upper lung measuring 1.2 cm. This has been present previously appears represent old left rib fracture. Additional old left rib fractures also identified. Degenerative changes in the shoulders. Left PICC catheter with tip over the low SVC. IMPRESSION: Shallow inspiration with atelectasis in the lung bases. Electronically Signed   By: Burman Nieves M.D.   On: 08/23/2015 22:35   Dg Chest Port 1 View  08/14/2015  CLINICAL DATA:  Pleural effusion EXAM: PORTABLE CHEST 1 VIEW COMPARISON:  August 07, 2015 FINDINGS: No pneumothorax. The cardiomediastinal silhouette is stable. Mild increased interstitial markings consistent with mild edema. These findings are mildly worsened  on the left than the right. No focal infiltrate. No effusion identified on this single frontal view. IMPRESSION: Increased interstitial markings consistent with mild edema, new in the interval. No definite pleural effusion identified. Electronically Signed   By: Gerome Sam III M.D   On: 08/14/2015 07:28   Dg Chest Port 1 View  08/07/2015  CLINICAL DATA:  Pt has an infected incision on his right hip. Hx of DM. No chest complaints. Pt is a nonsmoker EXAM: PORTABLE CHEST 1 VIEW COMPARISON:  07/02/2015 FINDINGS: Cardiac silhouette is normal in size and configuration. Normal mediastinal and hilar contours. Clear lungs.  No pleural effusion or pneumothorax. Bony thorax is intact. IMPRESSION: No active disease. Electronically Signed   By: Amie Portland M.D.   On: 08/07/2015 11:24   Dg Hip Port Unilat With Pelvis 1v Right  08/07/2015  CLINICAL DATA:  Right hip fracture, status post operative fixation 2 weeks  ago. Surgical incision infection with right hip pain. EXAM: DG HIP (WITH OR WITHOUT PELVIS) 1V PORT RIGHT COMPARISON:  07/18/2015 FINDINGS: Fixation hardware of the lumbar sacral spine and both hips evident. Recent right hip ORIF for an intertrochanteric fracture. Stable hardware and alignment. No definite hardware abnormality or acute osseous finding. Bony pelvis intact. Normal bowel gas pattern. IMPRESSION: No definite acute osseous or hardware abnormality by plain radiography. Electronically Signed   By: Judie Petit.  Shick M.D.   On: 08/07/2015 11:26    Microbiology: Recent Results (from the past 240 hour(s))  Culture, blood (routine x 2)     Status: None   Collection Time: 08/23/15  3:59 PM  Result Value Ref Range Status   Specimen Description BLOOD RIGHT HAND  Final   Special Requests IN PEDIATRIC BOTTLE 3CC  Final   Culture NO GROWTH 5 DAYS  Final   Report Status 08/28/2015 FINAL  Final  Culture, blood (routine x 2)     Status: None   Collection Time: 08/23/15  3:59 PM  Result Value Ref Range  Status   Specimen Description BLOOD RIGHT ARM  Final   Special Requests IN PEDIATRIC BOTTLE 3CC  Final   Culture NO GROWTH 5 DAYS  Final   Report Status 08/28/2015 FINAL  Final  Culture, Urine     Status: None   Collection Time: 08/31/15  4:52 PM  Result Value Ref Range Status   Specimen Description URINE, CATHETERIZED  Final   Special Requests NONE  Final   Culture MULTIPLE SPECIES PRESENT, SUGGEST RECOLLECTION  Final   Report Status 09/02/2015 FINAL  Final     Labs: Basic Metabolic Panel:  Recent Labs Lab 08/28/15 1455 08/30/15 0451 09/01/15 1510  NA 137 140 142  K 3.5 3.4* 3.7  CL 103 109 110  CO2 25 23 23   GLUCOSE 111* 147* 159*  BUN 9 10 6   CREATININE 0.94 1.02 0.90  CALCIUM 8.1* 7.5* 7.7*   Liver Function Tests: No results for input(s): AST, ALT, ALKPHOS, BILITOT, PROT, ALBUMIN in the last 168 hours. No results for input(s): LIPASE, AMYLASE in the last 168 hours. No results for input(s): AMMONIA in the last 168 hours. CBC:  Recent Labs Lab 08/26/15 1145 08/27/15 0443 08/28/15 0552  WBC 10.3 8.6 7.8  HGB 7.9* 8.1* 8.0*  HCT 24.3* 26.5* 25.8*  MCV 80.2 79.6 79.6  PLT 370 407* 371   Cardiac Enzymes: No results for input(s): CKTOTAL, CKMB, CKMBINDEX, TROPONINI in the last 168 hours. BNP: BNP (last 3 results)  Recent Labs  06/11/15 1106 07/02/15 0538  BNP 27.7 34.0    ProBNP (last 3 results) No results for input(s): PROBNP in the last 8760 hours.  CBG:  Recent Labs Lab 08/30/15 1218 08/30/15 2257 08/31/15 0658 08/31/15 2251 09/01/15 2217  GLUCAP 112* 104* 117* 97 117*       Signed:  Serena Petterson K  [facility] 09/02/2015, 11:39 AM

## 2015-09-02 NOTE — Clinical Social Work Note (Signed)
CSW was informed by case manager that patient's son has requested home health and does not want to send patient to a SNF.  Case manager is aware and making arrangements for patient and his family.  Jeff KnackEric R. Dana Ferrell, MSW, Theresia MajorsLCSWA [phone] 09/02/2015 9:57 AM

## 2015-09-03 MED ORDER — HEPARIN SOD (PORK) LOCK FLUSH 100 UNIT/ML IV SOLN
250.0000 [IU] | INTRAVENOUS | Status: AC | PRN
  Administered 2015-09-03: 250 [IU]

## 2015-09-03 NOTE — Care Management (Signed)
Case manager contacted patient's son, [phone], to inform him that everything is now in place for his father's discharge. Explained to him that home health and IV antibiotic infusion will begin tomorrow 09/04/15. Also informed him that case manager would be sending envelope with contact number for the social worker,Jennifer, at the [facility] so that he can facilitate changing his father's address for medication deliveries. Case manager also will send the FMLA paper work that was left at the hospital.    2:30pm Case manager received call from camilla stating that IV antibiotics are arranged. Start of care will be tomorrow am. 2:55pm Case manager called [facility] [place] office and spoke with Kennon RoundsSally, notified her that patient will be discharged home this afternoon.   10:00am-12N: Case manager has spoken with Megan Salonamilla pryor, RN community Health with the [facility] concerning aranging patient's IV antibiotics. Case manager was asked to contact [facility] to arrange for IV antibiotics. Call was placed to Adam with [facility]- orders, demographics, H&P were [facility]ed to [facility] [phone].

## 2015-09-03 NOTE — Progress Notes (Addendum)
Pt to d/c this afternoon to [place]'s house in [place] per MD. Wound vac changed over to home KCI vac and PICC capped by IV team. This RN spent 40 minutes on the phone discussing follow up appointments and reviewing each medication with Loistine Chancehilip, pt's son. All of his questions were answered.    5:47 PM  [facility] arrived to transfer pt to son's house, all of his belongings, including wound vac supplies, that were delivered to hospital did not fit on stretcher. Enough supplies were given to last well through the weekend. Son was notified that a family member would have to pick up extra belongings; he stated he would come tomorrow.   [place], Latricia HeftKorie G

## 2015-09-03 NOTE — Clinical Social Work Note (Signed)
CSW received referral for SNF.  Case discussed with case manager, and plan is to discharge to patient's son's home with home health.  CSW to sign off please re-consult if social work needs arise.  Ervin KnackEric R. Lannis Lichtenwalner, MSW, [employer]CSWA [phone]

## 2015-09-03 NOTE — Care Management (Signed)
4:43pm  [facility] stating they will see [facility] starting this pm.[facility]. [facility] to be called for transport to son's home.   Case manager received a call from Kennon RoundsSally with [facility] [place] stating that they unfortunately cannot provide start of care before Monday 09/06/15.  [facility] and [facility] information, also contacted Adam with [facility] to see if he can provide assistance. Case manager also notified Nicolasa DuckingSally Holland, AD for case management.

## 2015-12-23 DIAGNOSIS — Z515 Encounter for palliative care: Secondary | ICD-10-CM | POA: Insufficient documentation

## 2017-12-14 IMAGING — CR DG HIP (WITH OR WITHOUT PELVIS) 3-4V BILAT
5 series · 5 of 5 positions shown · non-contrast
Comparison: Pelvis and hip MRI 06/16/2015

CLINICAL DATA: Left and right hip pain. Left hip surgery 3 weeks
prior.

EXAM:
DG HIP (WITH OR WITHOUT PELVIS) 3-4V BILAT

[x pelvis]
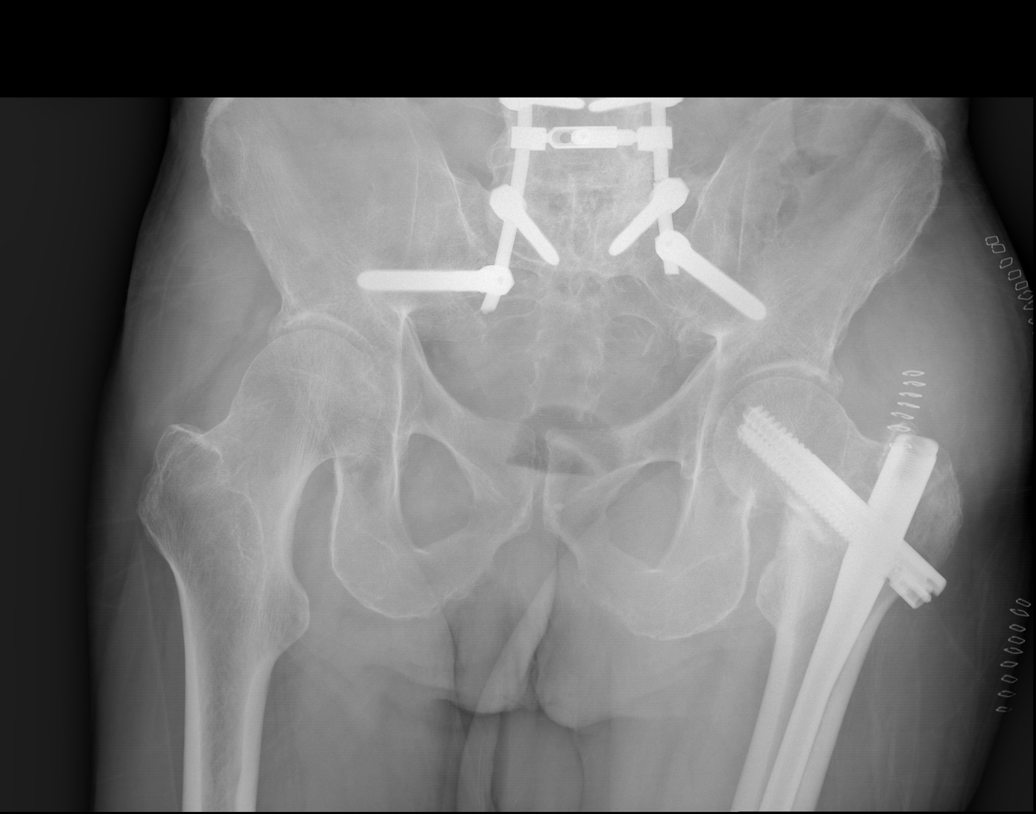

[x hip ap right]
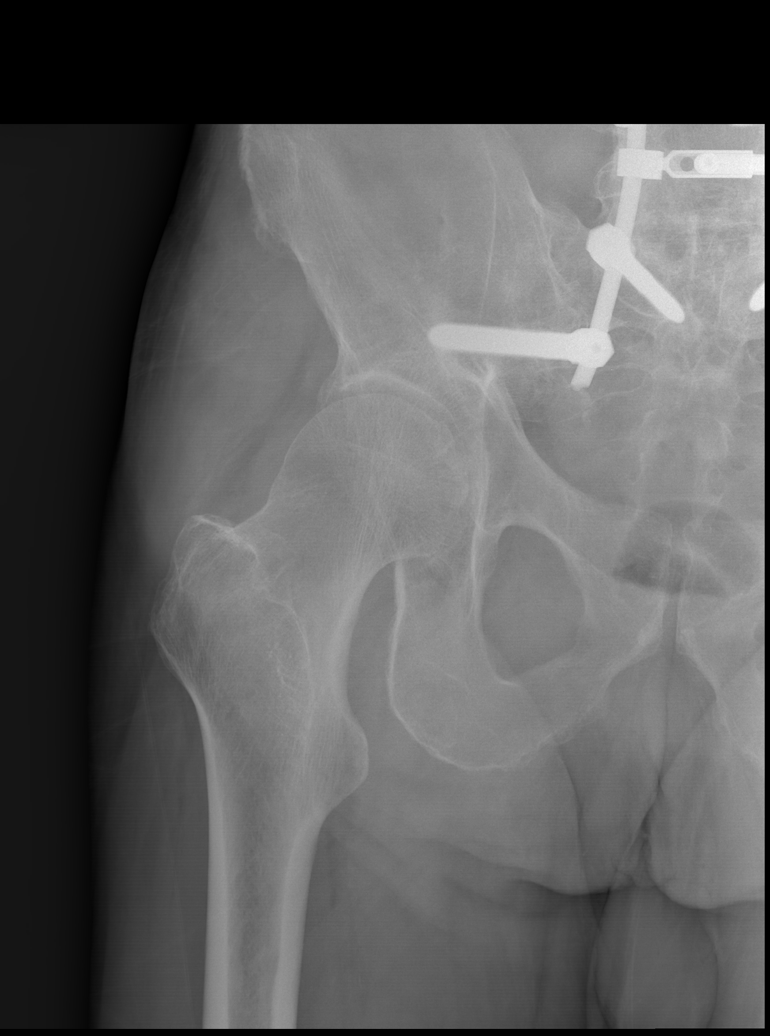

[x hip ap left]
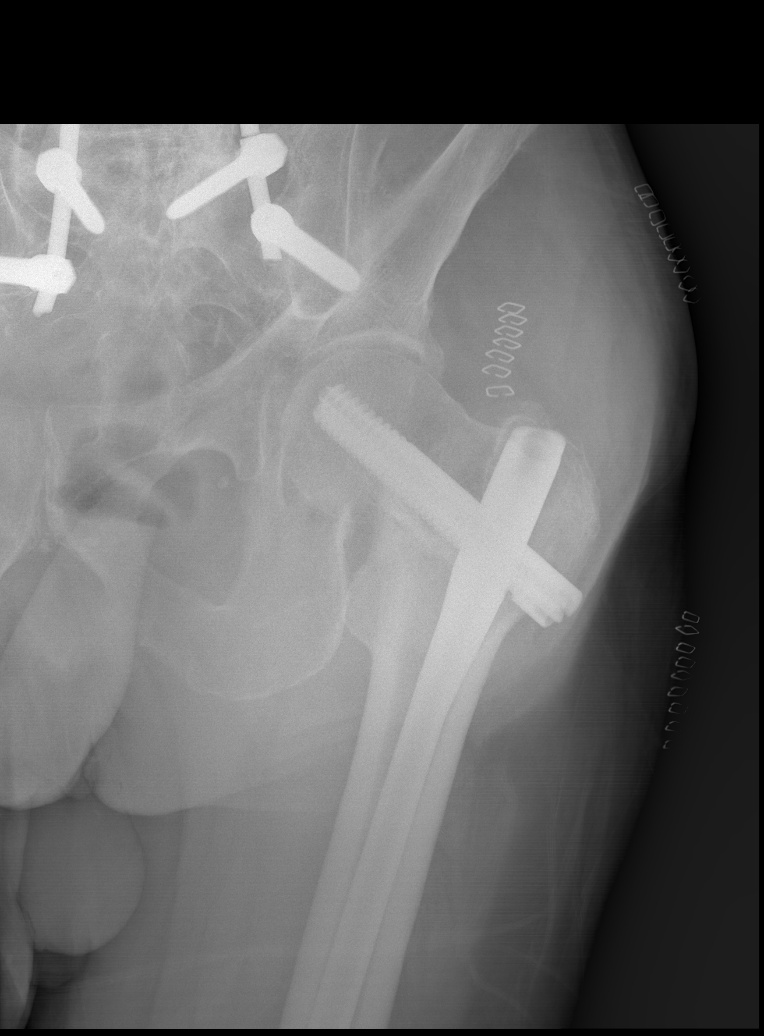

[w hip lat left]
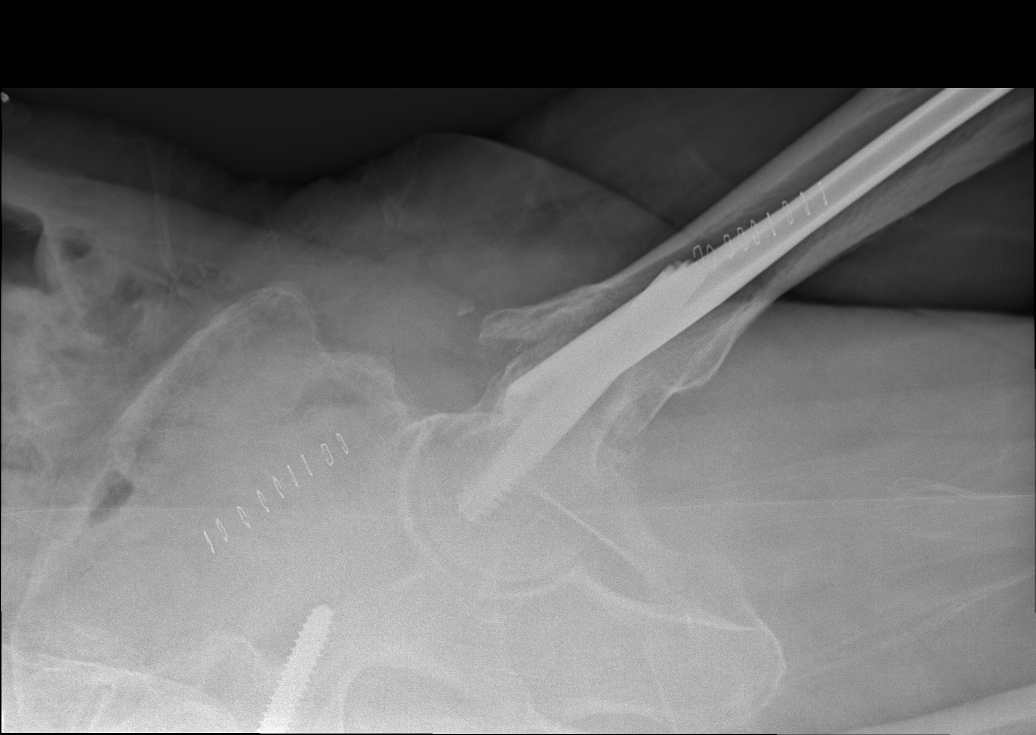

[w hip lat right]
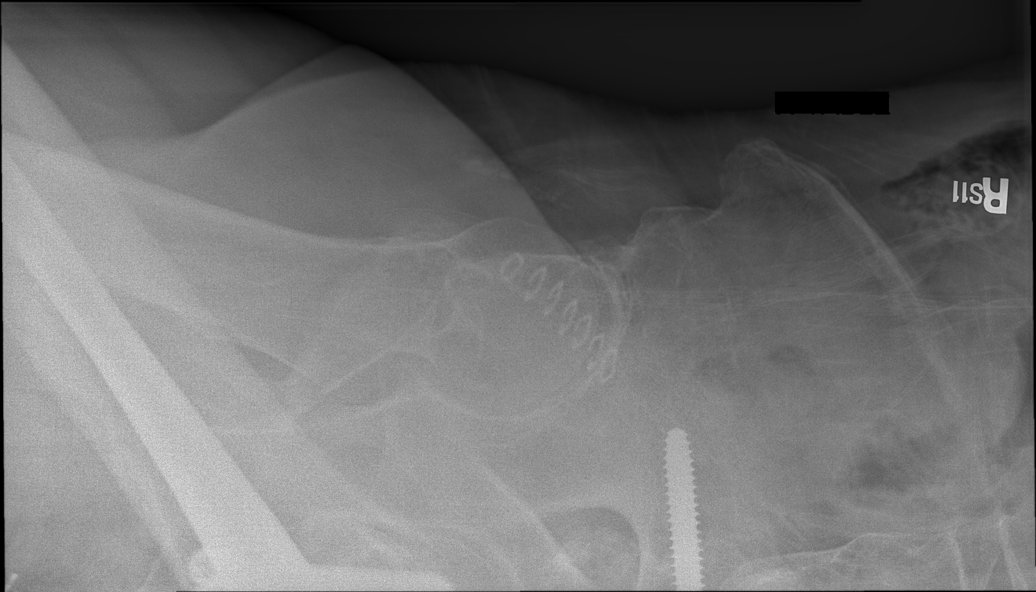

[5 of 5 positions shown; findings below may reference images not displayed]

FINDINGS: Intra medullary rod with trans trochanteric screws traverse the left
proximal femur, low femoral neck fracture not well seen
radiographically. No new fracture or periprosthetic lucency. Distal
aspect of the intra medullary rod not included in the field of view.
Skin staples laterally and anteriorly at the surgical site with
probable soft tissue edema.

Pubic rami and pubic symphysis are intact. Right femoral head is
seated in the acetabulum, unchanged from prior MRI. Lumbosacral
hardware again seen.
IMPRESSION: 1. Recent intra medullary rods and screw fixation of proximal left
femur fracture. No postoperative complication. Skin staples remain
in place with probable soft tissue edema.
2. No acute abnormality of the right hip or bony pelvis.
Postsurgical change in the lower lumbar spine and pelvis.

## 2023-06-05 ENCOUNTER — Inpatient Hospital Stay (HOSPITAL_COMMUNITY)
Admission: AD | Admit: 2023-06-05 | Discharge: 2023-06-20 | DRG: 871 | Disposition: E | Payer: No Typology Code available for payment source | Source: Other Acute Inpatient Hospital | Attending: Internal Medicine | Admitting: Internal Medicine

## 2023-06-05 ENCOUNTER — Inpatient Hospital Stay (HOSPITAL_COMMUNITY): Payer: No Typology Code available for payment source

## 2023-06-05 ENCOUNTER — Encounter (HOSPITAL_COMMUNITY): Payer: Self-pay | Admitting: Emergency Medicine

## 2023-06-05 ENCOUNTER — Encounter (HOSPITAL_COMMUNITY): Payer: Self-pay

## 2023-06-05 DIAGNOSIS — G9341 Metabolic encephalopathy: Secondary | ICD-10-CM | POA: Diagnosis present

## 2023-06-05 DIAGNOSIS — F1090 Alcohol use, unspecified, uncomplicated: Secondary | ICD-10-CM | POA: Diagnosis present

## 2023-06-05 DIAGNOSIS — J9601 Acute respiratory failure with hypoxia: Secondary | ICD-10-CM | POA: Diagnosis present

## 2023-06-05 DIAGNOSIS — N390 Urinary tract infection, site not specified: Secondary | ICD-10-CM | POA: Diagnosis present

## 2023-06-05 DIAGNOSIS — K7460 Unspecified cirrhosis of liver: Secondary | ICD-10-CM | POA: Diagnosis present

## 2023-06-05 DIAGNOSIS — Z79891 Long term (current) use of opiate analgesic: Secondary | ICD-10-CM

## 2023-06-05 DIAGNOSIS — R680 Hypothermia, not associated with low environmental temperature: Secondary | ICD-10-CM | POA: Diagnosis present

## 2023-06-05 DIAGNOSIS — E2740 Unspecified adrenocortical insufficiency: Secondary | ICD-10-CM | POA: Diagnosis present

## 2023-06-05 DIAGNOSIS — Z8673 Personal history of transient ischemic attack (TIA), and cerebral infarction without residual deficits: Secondary | ICD-10-CM

## 2023-06-05 DIAGNOSIS — H5702 Anisocoria: Secondary | ICD-10-CM | POA: Diagnosis present

## 2023-06-05 DIAGNOSIS — E872 Acidosis, unspecified: Secondary | ICD-10-CM | POA: Diagnosis present

## 2023-06-05 DIAGNOSIS — Z89611 Acquired absence of right leg above knee: Secondary | ICD-10-CM | POA: Diagnosis not present

## 2023-06-05 DIAGNOSIS — E11649 Type 2 diabetes mellitus with hypoglycemia without coma: Secondary | ICD-10-CM | POA: Diagnosis not present

## 2023-06-05 DIAGNOSIS — R570 Cardiogenic shock: Secondary | ICD-10-CM | POA: Diagnosis present

## 2023-06-05 DIAGNOSIS — D684 Acquired coagulation factor deficiency: Secondary | ICD-10-CM | POA: Diagnosis not present

## 2023-06-05 DIAGNOSIS — M545 Low back pain, unspecified: Secondary | ICD-10-CM | POA: Diagnosis present

## 2023-06-05 DIAGNOSIS — Z7982 Long term (current) use of aspirin: Secondary | ICD-10-CM

## 2023-06-05 DIAGNOSIS — R579 Shock, unspecified: Secondary | ICD-10-CM | POA: Diagnosis not present

## 2023-06-05 DIAGNOSIS — N170 Acute kidney failure with tubular necrosis: Secondary | ICD-10-CM | POA: Diagnosis present

## 2023-06-05 DIAGNOSIS — Z66 Do not resuscitate: Secondary | ICD-10-CM | POA: Diagnosis not present

## 2023-06-05 DIAGNOSIS — I214 Non-ST elevation (NSTEMI) myocardial infarction: Secondary | ICD-10-CM | POA: Diagnosis not present

## 2023-06-05 DIAGNOSIS — D509 Iron deficiency anemia, unspecified: Secondary | ICD-10-CM | POA: Diagnosis present

## 2023-06-05 DIAGNOSIS — Z79899 Other long term (current) drug therapy: Secondary | ICD-10-CM

## 2023-06-05 DIAGNOSIS — Z515 Encounter for palliative care: Secondary | ICD-10-CM

## 2023-06-05 DIAGNOSIS — N400 Enlarged prostate without lower urinary tract symptoms: Secondary | ICD-10-CM | POA: Diagnosis present

## 2023-06-05 DIAGNOSIS — R34 Anuria and oliguria: Secondary | ICD-10-CM | POA: Diagnosis not present

## 2023-06-05 DIAGNOSIS — I742 Embolism and thrombosis of arteries of the upper extremities: Secondary | ICD-10-CM | POA: Diagnosis not present

## 2023-06-05 DIAGNOSIS — E1165 Type 2 diabetes mellitus with hyperglycemia: Secondary | ICD-10-CM | POA: Diagnosis present

## 2023-06-05 DIAGNOSIS — K922 Gastrointestinal hemorrhage, unspecified: Secondary | ICD-10-CM | POA: Diagnosis not present

## 2023-06-05 DIAGNOSIS — Z89612 Acquired absence of left leg above knee: Secondary | ICD-10-CM

## 2023-06-05 DIAGNOSIS — N179 Acute kidney failure, unspecified: Secondary | ICD-10-CM | POA: Diagnosis not present

## 2023-06-05 DIAGNOSIS — L89153 Pressure ulcer of sacral region, stage 3: Secondary | ICD-10-CM | POA: Diagnosis present

## 2023-06-05 DIAGNOSIS — Y92009 Unspecified place in unspecified non-institutional (private) residence as the place of occurrence of the external cause: Secondary | ICD-10-CM

## 2023-06-05 DIAGNOSIS — I998 Other disorder of circulatory system: Secondary | ICD-10-CM | POA: Diagnosis not present

## 2023-06-05 DIAGNOSIS — E871 Hypo-osmolality and hyponatremia: Secondary | ICD-10-CM | POA: Diagnosis present

## 2023-06-05 DIAGNOSIS — Z86718 Personal history of other venous thrombosis and embolism: Secondary | ICD-10-CM

## 2023-06-05 DIAGNOSIS — R9431 Abnormal electrocardiogram [ECG] [EKG]: Secondary | ICD-10-CM | POA: Diagnosis not present

## 2023-06-05 DIAGNOSIS — E43 Unspecified severe protein-calorie malnutrition: Secondary | ICD-10-CM | POA: Diagnosis present

## 2023-06-05 DIAGNOSIS — D65 Disseminated intravascular coagulation [defibrination syndrome]: Secondary | ICD-10-CM | POA: Diagnosis not present

## 2023-06-05 DIAGNOSIS — H5704 Mydriasis: Secondary | ICD-10-CM | POA: Diagnosis present

## 2023-06-05 DIAGNOSIS — E1142 Type 2 diabetes mellitus with diabetic polyneuropathy: Secondary | ICD-10-CM | POA: Diagnosis present

## 2023-06-05 DIAGNOSIS — Z880 Allergy status to penicillin: Secondary | ICD-10-CM

## 2023-06-05 DIAGNOSIS — I5041 Acute combined systolic (congestive) and diastolic (congestive) heart failure: Secondary | ICD-10-CM | POA: Diagnosis present

## 2023-06-05 DIAGNOSIS — K250 Acute gastric ulcer with hemorrhage: Principal | ICD-10-CM

## 2023-06-05 DIAGNOSIS — E1140 Type 2 diabetes mellitus with diabetic neuropathy, unspecified: Secondary | ICD-10-CM | POA: Diagnosis not present

## 2023-06-05 DIAGNOSIS — Z681 Body mass index (BMI) 19 or less, adult: Secondary | ICD-10-CM | POA: Diagnosis not present

## 2023-06-05 DIAGNOSIS — Z794 Long term (current) use of insulin: Secondary | ICD-10-CM | POA: Diagnosis not present

## 2023-06-05 DIAGNOSIS — D6959 Other secondary thrombocytopenia: Secondary | ICD-10-CM | POA: Diagnosis present

## 2023-06-05 DIAGNOSIS — Z7189 Other specified counseling: Secondary | ICD-10-CM | POA: Diagnosis not present

## 2023-06-05 DIAGNOSIS — Z781 Physical restraint status: Secondary | ICD-10-CM

## 2023-06-05 DIAGNOSIS — Z933 Colostomy status: Secondary | ICD-10-CM | POA: Diagnosis not present

## 2023-06-05 DIAGNOSIS — G8929 Other chronic pain: Secondary | ICD-10-CM | POA: Diagnosis present

## 2023-06-05 DIAGNOSIS — L89123 Pressure ulcer of left upper back, stage 3: Secondary | ICD-10-CM | POA: Diagnosis present

## 2023-06-05 DIAGNOSIS — R001 Bradycardia, unspecified: Secondary | ICD-10-CM | POA: Diagnosis present

## 2023-06-05 DIAGNOSIS — Z931 Gastrostomy status: Secondary | ICD-10-CM

## 2023-06-05 DIAGNOSIS — I498 Other specified cardiac arrhythmias: Secondary | ICD-10-CM

## 2023-06-05 DIAGNOSIS — I472 Ventricular tachycardia, unspecified: Secondary | ICD-10-CM | POA: Diagnosis not present

## 2023-06-05 DIAGNOSIS — Z1612 Extended spectrum beta lactamase (ESBL) resistance: Secondary | ICD-10-CM | POA: Diagnosis present

## 2023-06-05 DIAGNOSIS — L89893 Pressure ulcer of other site, stage 3: Secondary | ICD-10-CM | POA: Diagnosis present

## 2023-06-05 DIAGNOSIS — E876 Hypokalemia: Secondary | ICD-10-CM | POA: Diagnosis present

## 2023-06-05 DIAGNOSIS — B9620 Unspecified Escherichia coli [E. coli] as the cause of diseases classified elsewhere: Secondary | ICD-10-CM | POA: Diagnosis present

## 2023-06-05 DIAGNOSIS — T380X5A Adverse effect of glucocorticoids and synthetic analogues, initial encounter: Secondary | ICD-10-CM | POA: Diagnosis present

## 2023-06-05 DIAGNOSIS — D62 Acute posthemorrhagic anemia: Secondary | ICD-10-CM | POA: Diagnosis present

## 2023-06-05 DIAGNOSIS — E1151 Type 2 diabetes mellitus with diabetic peripheral angiopathy without gangrene: Secondary | ICD-10-CM | POA: Diagnosis present

## 2023-06-05 DIAGNOSIS — E119 Type 2 diabetes mellitus without complications: Secondary | ICD-10-CM | POA: Diagnosis not present

## 2023-06-05 HISTORY — DX: Other specified health status: Z78.9

## 2023-06-05 HISTORY — DX: Unspecified adrenocortical insufficiency: E27.40

## 2023-06-05 HISTORY — DX: Benign prostatic hyperplasia without lower urinary tract symptoms: N40.0

## 2023-06-05 HISTORY — DX: Peptic ulcer, site unspecified, unspecified as acute or chronic, without hemorrhage or perforation: K27.9

## 2023-06-05 HISTORY — DX: Methicillin resistant Staphylococcus aureus infection as the cause of diseases classified elsewhere: B95.62

## 2023-06-05 HISTORY — DX: Transient cerebral ischemic attack, unspecified: G45.9

## 2023-06-05 HISTORY — DX: Acquired absence of right leg above knee: Z89.611

## 2023-06-05 HISTORY — DX: Iron deficiency anemia, unspecified: D50.9

## 2023-06-05 HISTORY — DX: Acute embolism and thrombosis of unspecified deep veins of unspecified lower extremity: I82.409

## 2023-06-05 LAB — POCT I-STAT 7, (LYTES, BLD GAS, ICA,H+H)
Acid-base deficit: 11 mmol/L — ABNORMAL HIGH (ref 0.0–2.0)
Bicarbonate: 14 mmol/L — ABNORMAL LOW (ref 20.0–28.0)
Calcium, Ion: 1.17 mmol/L (ref 1.15–1.40)
HCT: 25 % — ABNORMAL LOW (ref 39.0–52.0)
Hemoglobin: 8.5 g/dL — ABNORMAL LOW (ref 13.0–17.0)
O2 Saturation: 99 %
Patient temperature: 96.4
Potassium: 4.4 mmol/L (ref 3.5–5.1)
Sodium: 132 mmol/L — ABNORMAL LOW (ref 135–145)
TCO2: 15 mmol/L — ABNORMAL LOW (ref 22–32)
pCO2 arterial: 26.3 mm[Hg] — ABNORMAL LOW (ref 32–48)
pH, Arterial: 7.328 — ABNORMAL LOW (ref 7.35–7.45)
pO2, Arterial: 118 mm[Hg] — ABNORMAL HIGH (ref 83–108)

## 2023-06-05 LAB — GLUCOSE, CAPILLARY: Glucose-Capillary: 257 mg/dL — ABNORMAL HIGH (ref 70–99)

## 2023-06-05 MED ORDER — FENTANYL 2500MCG IN NS 250ML (10MCG/ML) PREMIX INFUSION: 25.0000 ug/h | INTRAVENOUS | Status: DC

## 2023-06-05 MED ORDER — HYDROCORTISONE SOD SUC (PF) 100 MG IJ SOLR
100.0000 mg | Freq: Three times a day (TID) | INTRAMUSCULAR | Status: DC
  Administered 2023-06-05 – 2023-06-10 (×14): 100 mg via INTRAVENOUS
  Filled 2023-06-05 (×14): qty 2

## 2023-06-05 MED ORDER — ORAL CARE MOUTH RINSE
15.0000 mL | OROMUCOSAL | Status: DC
  Administered 2023-06-06 – 2023-06-13 (×86): 15 mL via OROMUCOSAL

## 2023-06-05 MED ORDER — PROPOFOL 1000 MG/100ML IV EMUL: 0.0000 ug/kg/min | INTRAVENOUS | Status: DC

## 2023-06-05 MED ORDER — DOCUSATE SODIUM 50 MG/5ML PO LIQD
100.0000 mg | Freq: Two times a day (BID) | ORAL | Status: DC
  Administered 2023-06-08 – 2023-06-11 (×6): 100 mg
  Filled 2023-06-05 (×7): qty 10

## 2023-06-05 MED ORDER — VASOPRESSIN 20 UNITS/100 ML INFUSION FOR SHOCK: 0.0000 [IU]/min | INTRAVENOUS | Status: DC

## 2023-06-05 MED ORDER — FENTANYL CITRATE PF 50 MCG/ML IJ SOSY
25.0000 ug | PREFILLED_SYRINGE | Freq: Once | INTRAMUSCULAR | Status: DC
Start: 2023-06-05 — End: 2023-06-07
  Filled 2023-06-05: qty 1

## 2023-06-05 MED ORDER — PANTOPRAZOLE SODIUM 40 MG IV SOLR: 40.0000 mg | Freq: Every day | INTRAVENOUS | Status: DC

## 2023-06-05 MED ORDER — ORAL CARE MOUTH RINSE: 15.0000 mL | OROMUCOSAL | Status: DC | PRN

## 2023-06-05 MED ORDER — HEPARIN SODIUM (PORCINE) 5000 UNIT/ML IJ SOLN
5000.0000 [IU] | Freq: Three times a day (TID) | INTRAMUSCULAR | Status: DC
  Filled 2023-06-05: qty 1

## 2023-06-05 MED ORDER — INSULIN ASPART 100 UNIT/ML IJ SOLN
0.0000 [IU] | INTRAMUSCULAR | Status: DC
Start: 2023-06-06 — End: 2023-06-06
  Administered 2023-06-05: 5 [IU] via SUBCUTANEOUS

## 2023-06-05 MED ORDER — SODIUM BICARBONATE 8.4 % IV SOLN
50.0000 meq | Freq: Once | INTRAVENOUS | Status: AC
  Administered 2023-06-05: 50 meq via INTRAVENOUS
  Filled 2023-06-05: qty 50

## 2023-06-05 MED ORDER — NOREPINEPHRINE 4 MG/250ML-% IV SOLN
0.0000 ug/min | INTRAVENOUS | Status: DC
Start: 2023-06-06 — End: 2023-06-12
  Administered 2023-06-06: 27 ug/min via INTRAVENOUS
  Administered 2023-06-06: 2 ug/min via INTRAVENOUS
  Administered 2023-06-06: 10 ug/min via INTRAVENOUS
  Administered 2023-06-06 (×2): 18 ug/min via INTRAVENOUS
  Administered 2023-06-06: 19 ug/min via INTRAVENOUS
  Administered 2023-06-06: 14 ug/min via INTRAVENOUS
  Administered 2023-06-07: 11 ug/min via INTRAVENOUS
  Administered 2023-06-07: 10 ug/min via INTRAVENOUS
  Administered 2023-06-07: 8 ug/min via INTRAVENOUS
  Administered 2023-06-08 (×2): 4 ug/min via INTRAVENOUS
  Administered 2023-06-10: 1 ug/min via INTRAVENOUS
  Administered 2023-06-12: 18 ug/min via INTRAVENOUS
  Filled 2023-06-05 (×15): qty 250

## 2023-06-05 MED ORDER — PANTOPRAZOLE SODIUM 40 MG IV SOLR
40.0000 mg | Freq: Two times a day (BID) | INTRAVENOUS | Status: DC
  Administered 2023-06-05 – 2023-06-06 (×2): 40 mg via INTRAVENOUS
  Filled 2023-06-05 (×2): qty 10

## 2023-06-05 MED ORDER — POLYETHYLENE GLYCOL 3350 17 G PO PACK
17.0000 g | PACK | Freq: Every day | ORAL | Status: DC
  Filled 2023-06-05: qty 1

## 2023-06-05 MED ORDER — FENTANYL BOLUS VIA INFUSION
25.0000 ug | INTRAVENOUS | Status: DC | PRN
Start: 2023-06-05 — End: 2023-06-07
  Administered 2023-06-06: 25 ug via INTRAVENOUS
  Administered 2023-06-06 (×2): 50 ug via INTRAVENOUS
  Administered 2023-06-06: 25 ug via INTRAVENOUS
  Administered 2023-06-07: 50 ug via INTRAVENOUS

## 2023-06-05 MED ORDER — NOREPINEPHRINE 4 MG/250ML-% IV SOLN
INTRAVENOUS | Status: AC
  Filled 2023-06-05: qty 250

## 2023-06-05 MED ORDER — CHLORHEXIDINE GLUCONATE CLOTH 2 % EX PADS
6.0000 | MEDICATED_PAD | Freq: Every day | CUTANEOUS | Status: DC
  Administered 2023-06-05 – 2023-06-11 (×7): 6 via TOPICAL

## 2023-06-05 MED ORDER — VASOPRESSIN 20 UNITS/100 ML INFUSION FOR SHOCK
0.0000 [IU]/min | INTRAVENOUS | Status: DC
  Administered 2023-06-05: 0.03 [IU]/min via INTRAVENOUS
  Administered 2023-06-06: 0.04 [IU]/min via INTRAVENOUS
  Administered 2023-06-06: 0.03 [IU]/min via INTRAVENOUS
  Administered 2023-06-07: 0.04 [IU]/min via INTRAVENOUS
  Filled 2023-06-05 (×4): qty 100

## 2023-06-05 NOTE — H&P (Incomplete)
NAME:  Jeff Ferrell, MRN:  [id number], DOB:  July 06, 1950, LOS: 0 ADMISSION DATE:  06/05/2023 CONSULTATION DATE:  06/05/2023 REFERRING MD:  [facility] [place] CHIEF COMPLAINT:  Shock, acute respiratory failure, acute renal failure   History of Present Illness:  72 year old man who presented to [facility] as a transfer from [facility] for shock, acute respiratory failure and acute renal failure. PMHx significant for adrenal insufficiency, TIA, DVT, IDA, PUD, T2DM (c/b diabetic peripheral neuropathy/foot wounds and osteomyelitis requiring bilateral AKAs), low back pain 2/2 discitis s/p multilevel laminectomy, BPH  Patient is intubated/sedated, therefore history is obtained from chart review of records from [facility]. Patient was reportedly found down at home ~0900 12/17 with dark blood coming from ostomy. Glucose borderline low at 67 on EMS arrival and patient was noted to be cold, bradycardic and stiff. At [facility], labs were notable for WBC 35, Hgb 8.4. Na 123, CO2 6.5  Pertinent Medical History:   Past Medical History:  Diagnosis Date  . Adrenal insufficiency (HCC)   . Alcohol use   . BPH (benign prostatic hyperplasia)   . Chronic back pain   . Diabetes mellitus    regulated by diet  . Diabetic osteomyelitis (HCC) 03/29/2015  . DVT (deep venous thrombosis) (HCC)   . IDA (iron deficiency anemia)   . MRSA bacteremia   . PUD (peptic ulcer disease)   . S/P AKA (above knee amputation) bilateral (HCC)   . TIA (transient ischemic attack)    Significant Hospital Events: Including procedures, antibiotic start and stop dates in addition to other pertinent events   12/17 - Presented to [facility] as a transfer from [facility] for shock, ARF. Intubated, on pressors. PCCM consulted for admission.  Interim History / Subjective:  Transfer from [facility] Intubated on pressors  Objective:  Temperature (!) 96.4 F (35.8 C), temperature source Axillary, height  5\' 9"  (1.753 m), weight 48.5 kg, SpO2 100%.    Vent Mode: PRVC FiO2 (%):  [30 %] 30 % Set Rate:  [24 bmp-28 bmp] 24 bmp Vt Set:  [500 mL] 500 mL PEEP:  [5 cmH20] 5 cmH20  No intake or output data in the 24 hours ending 06/05/23 2343 Filed Weights   06/05/23 2256  Weight: 48.5 kg    Physical Examination: General: {SRACUITY:25313} ill-appearing *** in NAD. HEENT: [place]/AT, anicteric sclera, PERRL, moist mucous membranes. Neuro: {SRLOC:25308} {SRSTIMULI:25309} {SRCOMMANDS:25310} {SRNEUROEXTREMITIES:25312} Strength ***/5 in *** extremities. {SRRBRAINSTEM:25311}  CV: RRR, no m/g/r. PULM: Breathing even and unlabored on ***. Lung fields ***. GI: Soft, nontender, nondistended. Normoactive bowel sounds. Extremities: *** LE edema noted. Skin: Warm/dry, ***.  Resolved Hospital Problem List:    Assessment & Plan:  ***  Best Practice: (right click and "Reselect all SmartList Selections" daily)   Diet/type: NPO DVT prophylaxis: SCDs, SQH GI prophylaxis: PPI Lines: {Central Venous Access:25771} Foley:  {Central Venous Access:25691} Code Status:  full code Last date of multidisciplinary goals of care discussion [Pending]  Labs:  CBC: Recent Labs  Lab 06/05/23 2310  HGB 8.5*  HCT 25.0*    Basic Metabolic Panel: Recent Labs  Lab 06/05/23 2310  NA 132*  K 4.4   GFR: CrCl cannot be calculated (Patient's most recent lab result is older than the maximum 21 days allowed.). No results for input(s): "PROCALCITON", "WBC", "LATICACIDVEN" in the last 168 hours.  Liver Function Tests: No results for input(s): "AST", "ALT", "ALKPHOS", "BILITOT", "PROT", "ALBUMIN" in the last 168 hours. No results for  input(s): "LIPASE", "AMYLASE" in the last 168 hours. No results for input(s): "AMMONIA" in the last 168 hours.  ABG:    Component Value Date/Time   PHART 7.328 (L) 06/05/2023 2310   PCO2ART 26.3 (L) 06/05/2023 2310   PO2ART 118 (H) 06/05/2023 2310   HCO3 14.0 (L) 06/05/2023 2310    TCO2 15 (L) 06/05/2023 2310   ACIDBASEDEF 11.0 (H) 06/05/2023 2310   O2SAT 99 06/05/2023 2310    Coagulation Profile: No results for input(s): "INR", "PROTIME" in the last 168 hours.  Cardiac Enzymes: No results for input(s): "CKTOTAL", "CKMB", "CKMBINDEX", "TROPONINI" in the last 168 hours.  HbA1C: Hgb A1c MFr Bld  Date/Time Value Ref Range Status  08/10/2015 11:30 AM 5.7 (H) 4.8 - 5.6 % Final    Comment:    (NOTE)         Pre-diabetes: 5.7 - 6.4         Diabetes: >6.4         Glycemic control for adults with diabetes: <7.0   03/29/2015 11:53 AM 7.6 (H) 4.8 - 5.6 % Final    Comment:    (NOTE)         Pre-diabetes: 5.7 - 6.4         Diabetes: >6.4         Glycemic control for adults with diabetes: <7.0    CBG: Recent Labs  Lab 06/05/23 2259  GLUCAP 257*    Review of Systems:   Patient is encephalopathic and/or intubated; therefore, history has been obtained from chart review.   Past Medical History:  He,  has a past medical history of Adrenal insufficiency (HCC), Alcohol use, BPH (benign prostatic hyperplasia), Chronic back pain, Diabetes mellitus, Diabetic osteomyelitis (HCC) (03/29/2015), DVT (deep venous thrombosis) (HCC), IDA (iron deficiency anemia), MRSA bacteremia, PUD (peptic ulcer disease), S/P AKA (above knee amputation) bilateral (HCC), and TIA (transient ischemic attack).   Surgical History:   Past Surgical History:  Procedure Laterality Date  . AMPUTATION TOE Left 03/30/2015   Procedure: AMPUTATION LEFT GREAT TOE;  Surgeon: Marcene Corning, MD;  Location: [facility] OR[facility];  Service: Orthopedics;  Laterality: Left;  . BACK SURGERY     screws & hardware to lower back  . FEMUR IM NAIL Left 06/18/2015   Procedure: INTRAMEDULLARY (IM) NAIL FEMORAL;  Surgeon: Tarry Kos, MD;  [facility];  Service: Orthopedics;  Laterality: Left;  . FEMUR IM NAIL Right 07/21/2015   Procedure: Intramedullary nail femoral;  Surgeon: Tarry Kos, MD;  [facility];  Service:  Orthopedics;  Laterality: Right;  . INCISION AND DRAINAGE HIP Right 08/08/2015   Procedure: IRRIGATION AND DEBRIDEMENT RIGHT HIP WITH INCISIONAL WOUND VAC PLACEMENT;  Surgeon: Cammy Copa, MD;  [facility];  Service: Orthopedics;  Laterality: Right;  . INCISION AND DRAINAGE HIP Right 08/18/2015   Procedure: IRRIGATION AND DEBRIDEMENT RIGHT HIP, WOUND VAC;  Surgeon: Tarry Kos, MD;  [facility];  Service: Orthopedics;  Laterality: Right;  . RADIOLOGY WITH ANESTHESIA N/A 08/12/2015   Procedure: LUMBAR WITH WITHOUT CONTRAST THORACIC WITH WITHOUT CONTRAST (RADIOLOGY WITH ANESTHESIA);  Surgeon: Medication Radiologist, MD;  [facility];  Service: Radiology;  Laterality: N/A;  . TEE WITHOUT CARDIOVERSION N/A 08/13/2015   Procedure: TRANSESOPHAGEAL ECHOCARDIOGRAM (TEE);  Surgeon: Chilton Si, MD;  Location: [facility] ENDOSCOPY;  Service: Cardiovascular;  Laterality: N/A;    Social History:   reports that he has never smoked. He does not have any smokeless tobacco history on file. He reports  current alcohol use of about 6.0 standard drinks of alcohol per week. He reports that he does not use drugs.   Family History:  His family history is not on file.   Allergies: Allergies  Allergen Reactions  . Penicillins Hives and Rash    Has patient had a PCN reaction causing immediate rash, facial/tongue/throat swelling, SOB or lightheadedness with hypotension: Yes Has patient had a PCN reaction causing severe rash involving mucus membranes or skin necrosis: Yes   Has patient had a PCN reaction that required hospitalization No Has patient had a PCN reaction occurring within the last 10 years: No If all of the above answers are "NO", then may proceed with Cephalosporin use.     Home Medications: Prior to Admission medications   Medication Sig Start Date End Date Taking? Authorizing Provider  acetaminophen (TYLENOL) 325 MG tablet Take 2 tablets (650 mg total) by mouth every 6 (six) hours as  needed for mild pain (or Fever >/= 101). 07/06/15   Osvaldo Shipper, MD  Amino Acids-Protein Hydrolys (FEEDING SUPPLEMENT, PRO-STAT SUGAR FREE 64,) LIQD Take 30 mLs by mouth 2 (two) times daily. 09/01/15   Hollice Espy, MD  aspirin EC 325 MG EC tablet Take 1 tablet (325 mg total) by mouth daily. 09/01/15   Hollice Espy, MD  baclofen (LIORESAL) 20 MG tablet Take 1 tablet (20 mg total) by mouth 3 (three) times daily. 09/01/15   Hollice Espy, MD  bisacodyl (DULCOLAX) 5 MG EC tablet Take 2 tablets (10 mg total) by mouth daily. 09/01/15   Hollice Espy, MD  diazepam (VALIUM) 5 MG tablet Take 1 tablet (5 mg total) by mouth 2 (two) times daily. 09/01/15   Hollice Espy, MD  feeding supplement, ENSURE ENLIVE, (ENSURE ENLIVE) LIQD Take 237 mLs by mouth 3 (three) times daily between meals. 09/01/15   Hollice Espy, MD  fentaNYL (DURAGESIC - DOSED MCG/HR) 75 MCG/HR Place 1 patch (75 mcg total) onto the skin every 3 (three) days. 09/01/15   Hollice Espy, MD  OLANZapine (ZYPREXA) 5 MG tablet Take 1 tablet (5 mg total) by mouth at bedtime. 09/01/15   Hollice Espy, MD  polyethylene glycol [facility] / Ethelene Hal) packet Take 17 g by mouth daily. 09/01/15   Hollice Espy, MD  pregabalin (LYRICA) 100 MG capsule Take 1 capsule (100 mg total) by mouth 2 (two) times daily. 09/01/15   Hollice Espy, MD  rifampin (RIFADIN) 300 MG capsule Take 2 capsules (600 mg total) by mouth daily. 09/01/15   Hollice Espy, MD  tamsulosin (FLOMAX) 0.4 MG CAPS capsule Take 1 capsule (0.4 mg total) by mouth daily. 06/21/15   Catarina Hartshorn, MD    Critical care time:   The patient is critically ill with multiple organ system failure and requires high complexity decision making for assessment and support, frequent evaluation and titration of therapies, advanced monitoring, review of radiographic studies and interpretation of complex data.   Critical Care Time devoted to patient care services,  exclusive of separately billable procedures, described in this note is *** minutes.  Tim Lair, PA-C [facility] 06/05/23 11:43 PM  Please see [email or web address] for pager details.  From 7A-7P if no response, please call [phone] After hours, please call ELink [phone]

## 2023-06-05 NOTE — Progress Notes (Signed)
06/05/2023 Please see APP H+P for further details.  This gentleman arrives on high dose pressors with blood coming out of ostomy and frank pus vs. Feculent material coming out of foley.  Labs are pending.  On exam, he has a cough/gag but his L pupil  is blown, R is pinpoint. Both do not react to light.  He also does not have a corneal reflex nor oculocephalic.  Multiple old abdominal scars seen.  He has bilateral AKA.  There is an OSH MML in L groin.  Treating as some combination septic/ hemorrhagic shock.  Grab usual labs, resuscitate with mix of blood products vs. Crystalloid depending on what we think is driving shock more (via labs and ostomy output).  Add vaso, stress steroids, PPI.  Broad spectrum abx, f/u culture data.  Vent bundle.  Avoid acidemia, hypothermia, coagulopathy.  Hold sedation, consider head CT if [place] did not get one.  This man appears to be very chronically ill.  A GOC conversation may be needed if does not turn around quickly.  My cc time 20 mins  Myrla Halsted MD PCCM

## 2023-06-05 NOTE — H&P (Addendum)
NAME:  Jeff Ferrell, MRN:  [id number], DOB:  04/09/1951, LOS: 1 ADMISSION DATE:  06/05/2023 CONSULTATION DATE:  06/05/2023 REFERRING MD:  [facility] [place] CHIEF COMPLAINT:  Shock, acute respiratory failure, acute renal failure   History of Present Illness:  72 year old man who presented to [facility] as a transfer from [facility] for shock, acute respiratory failure and acute renal failure. PMHx significant for adrenal insufficiency, TIA, DVT, IDA, PUD, T2DM (c/b diabetic peripheral neuropathy/foot wounds and /;osteomyelitis requiring bilateral AKAs), low back pain 2/2 discitis s/p multilevel laminectomy, BPH.  Patient is intubated/sedated, therefore history is obtained from chart review of records from [facility]. Patient was reportedly found down at home ~0900 12/17 with dark blood coming from ostomy. Glucose borderline low at 67 on EMS arrival and patient was noted to be cold, bradycardic and stiff. Required bagging with EMS due to insufficient respiratory effort. Ultimately required intubation.R femoral central line placed. At [facility], labs were notable for WBC 35, Hgb 8.4. Na 123, CO2 6.5, BUN 31/Cr 2.88. AST 60. Ammonia 86. AG 17. Vanc/Zosyn were initiated as well as bicarbonate gtt. Levophed started for vasopressor support.  Transferred to [facility] for higher level of care.  Pertinent Medical History:   Past Medical History:  Diagnosis Date   Adrenal insufficiency (HCC)    Alcohol use    BPH (benign prostatic hyperplasia)    Chronic back pain    Diabetes mellitus    regulated by diet   Diabetic osteomyelitis (HCC) 03/29/2015   DVT (deep venous thrombosis) (HCC)    IDA (iron deficiency anemia)    MRSA bacteremia    PUD (peptic ulcer disease)    S/P AKA (above knee amputation) bilateral (HCC)    TIA (transient ischemic attack)    Significant Hospital Events: Including procedures, antibiotic start and stop dates in addition to other pertinent events   12/17 -  Presented to [facility] as a transfer from [facility] for shock, ARF. Intubated, on pressors. PCCM consulted for admission.  Interim History / Subjective:  Transfer from [facility] Intubated on pressors Skin in poor repair Dark frank blood/clots from ostomy  Objective:  Blood pressure 139/84, pulse (!) 107, temperature (!) 96.4 F (35.8 C), temperature source Axillary, resp. rate (!) 24, height 5\' 9"  (1.753 m), weight 48.5 kg, SpO2 100%.    Vent Mode: PRVC FiO2 (%):  [30 %] 30 % Set Rate:  [24 bmp-28 bmp] 24 bmp Vt Set:  [500 mL] 500 mL PEEP:  [5 cmH20] 5 cmH20  No intake or output data in the 24 hours ending 06/06/23 0233 Filed Weights   06/05/23 2256  Weight: 48.5 kg   Physical Examination: General: Acute-on-chronically ill-appearing elderly man in NAD. HEENT: [place]/AT, anicteric sclera, pupils unequal with R pupil 81mm/nonreactive, L pupil 5mm nonreactive, dry mucous membranes. Neuro:  Intubated, sedated.  Does not respond to verbal, tactile or noxious stimuli. Not following commands. No spontaneous movement of extremities noted. Intermittent weak cough, no gag/no corneals. CV: Tachycardic, regular rhythm, no m/g/r. PULM: Breathing even and unlabored on vent (PEEP 5, FiO2 30%). Lung fields diminished at bilateral bases, coarse in upper fields. GI: Soft, nontender, nondistended. RLQ ostomy bag with frank, dark blood with significant clots. Hypoactive bowel sounds. Extremities: Bilateral AKA noted, well-healed. Skin: Warm/dry, multiple malodorous wounds to back/sacrum, POA.  Resolved Hospital Problem List:    Assessment & Plan:  Undifferentiated shock, presume septic (urologic source) versus hemorrhagic Gastrointestinal bleeding, melena Adrenal insufficiency  -  Admit to ICU as transfer from [facility] - Goal MAP > 65 - Fluid resuscitation as tolerated, s/p bicarb gtt - Levophed titrated to goal MAP - Hydrocortisone for SDS, given history of AI - Trend  H&H - Transfuse for Hgb < 7.0 or hemodynamically significant bleeding; 1U PRBCs ordered for Hgb 6.6 - Trend WBC, fever curve, LA - F/u Cx data (pending) - Continue broad-spectrum antibiotics (Vanc/Zosyn)  Acute hypoxemic respiratory failure - Continue full vent support (4-8cc/kg IBW) - Wean FiO2 for O2 sat > 90% - Daily WUA/SBT, not appropriate at this time due to poor mental status - VAP bundle - Pulmonary hygiene - PAD protocol for sedation: Propofol and Fentanyl for goal RASS 0 to -1  History of TIA Uneven pupils CT Head/C-spine NAICA, no acute fracture/subluxation. - Low threshold to repeat CT Head  AKI/ARF in the setting of sepsis/ATN AGMA - Trend BMP - Replete electrolytes as indicated - Bicarb gtt + additional amp for fluid resuscitation - Monitor I&Os, maintain Foley - F/u urine studies - Avoid nephrotoxic agents as able - Ensure adequate renal perfusion  Hyperglycemia T2DM - Insulin gtt - CBGs per EndoTool - Goal CBG 140-180 - F/u A1c  Peripheral neuropathy s/p bilateral AKA Multiple skin wounds including sacral decubitus ulcer, POA - WOCN consult  GOC - Full code at present - Recommend PMT consult for assistance with GOC discussions given critical illness and multiple comorbidities  Best Practice: (right click and "Reselect all SmartList Selections" daily)   Diet/type: NPO DVT prophylaxis: SCDs, SQH GI prophylaxis: PPI Lines: Central line Foley:  Yes, and it is still needed Code Status:  full code Last date of multidisciplinary goals of care discussion [Pending]  Labs:  CBC: Recent Labs  Lab 06/05/23 2310 06/05/23 2330  WBC  --  28.5*  NEUTROABS  --  26.6*  HGB 8.5* 6.6*  HCT 25.0* 20.2*  MCV  --  98.1  PLT  --  110*    Basic Metabolic Panel: Recent Labs  Lab 06/05/23 2310 06/05/23 2330  NA 132* 123*  K 4.4 4.0  CL  --  96*  CO2  --  18*  GLUCOSE  --  510*  BUN  --  26*  CREATININE  --  2.17*  CALCIUM  --  6.6*  MG  --  1.0*   PHOS  --  2.2*   GFR: Estimated Creatinine Clearance: 20.8 mL/min (A) (by C-G formula based on SCr of 2.17 mg/dL (H)). Recent Labs  Lab 06/05/23 2330  PROCALCITON 4.86  WBC 28.5*    Liver Function Tests: Recent Labs  Lab 06/05/23 2330  AST 100*  ALT 30  ALKPHOS 81  BILITOT 1.2*  PROT 4.5*  ALBUMIN <1.5*   No results for input(s): "LIPASE", "AMYLASE" in the last 168 hours. No results for input(s): "AMMONIA" in the last 168 hours.  ABG:    Component Value Date/Time   PHART 7.328 (L) 06/05/2023 2310   PCO2ART 26.3 (L) 06/05/2023 2310   PO2ART 118 (H) 06/05/2023 2310   HCO3 14.0 (L) 06/05/2023 2310   TCO2 15 (L) 06/05/2023 2310   ACIDBASEDEF 11.0 (H) 06/05/2023 2310   O2SAT 99 06/05/2023 2310    Coagulation Profile: No results for input(s): "INR", "PROTIME" in the last 168 hours.  Cardiac Enzymes: No results for input(s): "CKTOTAL", "CKMB", "CKMBINDEX", "TROPONINI" in the last 168 hours.  HbA1C: Hgb A1c MFr Bld  Date/Time Value Ref Range Status  06/05/2023 11:30 PM 4.9 4.8 - 5.6 % Final  Comment:    (NOTE) Pre diabetes:          5.7%-6.4%  Diabetes:              >6.4%  Glycemic control for   <7.0% adults with diabetes   08/10/2015 11:30 AM 5.7 (H) 4.8 - 5.6 % Final    Comment:    (NOTE)         Pre-diabetes: 5.7 - 6.4         Diabetes: >6.4         Glycemic control for adults with diabetes: <7.0    CBG: Recent Labs  Lab 06/05/23 2259 06/06/23 0137 06/06/23 0139  GLUCAP 257* 263* 594*    Review of Systems:   Patient is encephalopathic and/or intubated; therefore, history has been obtained from chart review.   Past Medical History:  He,  has a past medical history of Adrenal insufficiency (HCC), Alcohol use, BPH (benign prostatic hyperplasia), Chronic back pain, Diabetes mellitus, Diabetic osteomyelitis (HCC) (03/29/2015), DVT (deep venous thrombosis) (HCC), IDA (iron deficiency anemia), MRSA bacteremia, PUD (peptic ulcer disease), S/P AKA  (above knee amputation) bilateral (HCC), and TIA (transient ischemic attack).   Surgical History:   Past Surgical History:  Procedure Laterality Date   AMPUTATION TOE Left 03/30/2015   Procedure: AMPUTATION LEFT GREAT TOE;  Surgeon: Marcene Corning, MD;  Location: [facility];  Service: Orthopedics;  Laterality: Left;   BACK SURGERY     screws & hardware to lower back   FEMUR IM NAIL Left 06/18/2015   Procedure: INTRAMEDULLARY (IM) NAIL FEMORAL;  Surgeon: Tarry Kos, MD;  [facility];  Service: Orthopedics;  Laterality: Left;   FEMUR IM NAIL Right 07/21/2015   Procedure: Intramedullary nail femoral;  Surgeon: Tarry Kos, MD;  [facility];  Service: Orthopedics;  Laterality: Right;   INCISION AND DRAINAGE HIP Right 08/08/2015   Procedure: IRRIGATION AND DEBRIDEMENT RIGHT HIP WITH INCISIONAL WOUND VAC PLACEMENT;  Surgeon: Cammy Copa, MD;  [facility];  Service: Orthopedics;  Laterality: Right;   INCISION AND DRAINAGE HIP Right 08/18/2015   Procedure: IRRIGATION AND DEBRIDEMENT RIGHT HIP, WOUND VAC;  Surgeon: Tarry Kos, MD;  [facility]: [facility];  Service: Orthopedics;  Laterality: Right;   RADIOLOGY WITH ANESTHESIA N/A 08/12/2015   Procedure: LUMBAR WITH WITHOUT CONTRAST THORACIC WITH WITHOUT CONTRAST (RADIOLOGY WITH ANESTHESIA);  Surgeon: Medication Radiologist, MD;  Location: [facility];  Service: Radiology;  Laterality: N/A;   TEE WITHOUT CARDIOVERSION N/A 08/13/2015   Procedure: TRANSESOPHAGEAL ECHOCARDIOGRAM (TEE);  Surgeon: Chilton Si, MD;  Location: [facility] ENDOSCOPY;  Service: Cardiovascular;  Laterality: N/A;    Social History:   reports that he has never smoked. He does not have any smokeless tobacco history on file. He reports current alcohol use of about 6.0 standard drinks of alcohol per week. He reports that he does not use drugs.   Family History:  His family history is not on file.   Allergies: Allergies  Allergen Reactions   Penicillins Hives and Rash    Has  patient had a PCN reaction causing immediate rash, facial/tongue/throat swelling, SOB or lightheadedness with hypotension: Yes Has patient had a PCN reaction causing severe rash involving mucus membranes or skin necrosis: Yes   Has patient had a PCN reaction that required hospitalization No Has patient had a PCN reaction occurring within the last 10 years: No If all of the above answers are "NO", then may proceed with Cephalosporin use.     Home  Medications: Prior to Admission medications   Medication Sig Start Date End Date Taking? Authorizing Provider  acetaminophen (TYLENOL) 325 MG tablet Take 2 tablets (650 mg total) by mouth every 6 (six) hours as needed for mild pain (or Fever >/= 101). 07/06/15   Osvaldo Shipper, MD  Amino Acids-Protein Hydrolys (FEEDING SUPPLEMENT, PRO-STAT SUGAR FREE 64,) LIQD Take 30 mLs by mouth 2 (two) times daily. 09/01/15   Hollice Espy, MD  aspirin EC 325 MG EC tablet Take 1 tablet (325 mg total) by mouth daily. 09/01/15   Hollice Espy, MD  baclofen (LIORESAL) 20 MG tablet Take 1 tablet (20 mg total) by mouth 3 (three) times daily. 09/01/15   Hollice Espy, MD  bisacodyl (DULCOLAX) 5 MG EC tablet Take 2 tablets (10 mg total) by mouth daily. 09/01/15   Hollice Espy, MD  diazepam (VALIUM) 5 MG tablet Take 1 tablet (5 mg total) by mouth 2 (two) times daily. 09/01/15   Hollice Espy, MD  feeding supplement, ENSURE ENLIVE, (ENSURE ENLIVE) LIQD Take 237 mLs by mouth 3 (three) times daily between meals. 09/01/15   Hollice Espy, MD  fentaNYL (DURAGESIC - DOSED MCG/HR) 75 MCG/HR Place 1 patch (75 mcg total) onto the skin every 3 (three) days. 09/01/15   Hollice Espy, MD  OLANZapine (ZYPREXA) 5 MG tablet Take 1 tablet (5 mg total) by mouth at bedtime. 09/01/15   Hollice Espy, MD  polyethylene glycol [facility] / Ethelene Hal) packet Take 17 g by mouth daily. 09/01/15   Hollice Espy, MD  pregabalin (LYRICA) 100 MG capsule Take 1 capsule  (100 mg total) by mouth 2 (two) times daily. 09/01/15   Hollice Espy, MD  rifampin (RIFADIN) 300 MG capsule Take 2 capsules (600 mg total) by mouth daily. 09/01/15   Hollice Espy, MD  tamsulosin (FLOMAX) 0.4 MG CAPS capsule Take 1 capsule (0.4 mg total) by mouth daily. 06/21/15   Catarina Hartshorn, MD    Critical care time:   The patient is critically ill with multiple organ system failure and requires high complexity decision making for assessment and support, frequent evaluation and titration of therapies, advanced monitoring, review of radiographic studies and interpretation of complex data.   Critical Care Time devoted to patient care services, exclusive of separately billable procedures, described in this note is 42 minutes.  Tim Lair, PA-C [place] [facility] 06/06/23 2:33 AM  Please see [email or web address] for pager details.  From 7A-7P if no response, please call [phone] After hours, please call ELink [phone]

## 2023-06-05 NOTE — Progress Notes (Signed)
eLink Physician-Brief Progress Note Patient Name: Male Meath DOB: [birth year] MRN: [id number]   Date of Service  06/05/2023  HPI/Events of Note  Patient transferred to [facility] ICU from outside hospital with shock, acute respiratory failure, and acute renal failure.  eICU Interventions  New Patient Evaluation.        Thomasene Lot Brendalee Matthies 06/05/2023, 11:21 PM

## 2023-06-06 ENCOUNTER — Encounter (HOSPITAL_COMMUNITY)
Admission: AD | Disposition: E | Payer: Self-pay | Source: Other Acute Inpatient Hospital | Attending: Emergency Medicine

## 2023-06-06 ENCOUNTER — Other Ambulatory Visit (HOSPITAL_COMMUNITY): Payer: Medicare Other

## 2023-06-06 ENCOUNTER — Inpatient Hospital Stay (HOSPITAL_COMMUNITY): Payer: No Typology Code available for payment source

## 2023-06-06 DIAGNOSIS — E871 Hypo-osmolality and hyponatremia: Secondary | ICD-10-CM

## 2023-06-06 DIAGNOSIS — Z8673 Personal history of transient ischemic attack (TIA), and cerebral infarction without residual deficits: Secondary | ICD-10-CM

## 2023-06-06 DIAGNOSIS — R579 Shock, unspecified: Secondary | ICD-10-CM | POA: Diagnosis not present

## 2023-06-06 DIAGNOSIS — D684 Acquired coagulation factor deficiency: Secondary | ICD-10-CM | POA: Diagnosis not present

## 2023-06-06 DIAGNOSIS — Z89611 Acquired absence of right leg above knee: Secondary | ICD-10-CM

## 2023-06-06 DIAGNOSIS — Z89612 Acquired absence of left leg above knee: Secondary | ICD-10-CM

## 2023-06-06 DIAGNOSIS — Z933 Colostomy status: Secondary | ICD-10-CM

## 2023-06-06 DIAGNOSIS — E1140 Type 2 diabetes mellitus with diabetic neuropathy, unspecified: Secondary | ICD-10-CM

## 2023-06-06 DIAGNOSIS — K250 Acute gastric ulcer with hemorrhage: Secondary | ICD-10-CM

## 2023-06-06 DIAGNOSIS — E1165 Type 2 diabetes mellitus with hyperglycemia: Secondary | ICD-10-CM | POA: Diagnosis not present

## 2023-06-06 DIAGNOSIS — Z794 Long term (current) use of insulin: Secondary | ICD-10-CM

## 2023-06-06 DIAGNOSIS — J9601 Acute respiratory failure with hypoxia: Secondary | ICD-10-CM | POA: Diagnosis not present

## 2023-06-06 DIAGNOSIS — K922 Gastrointestinal hemorrhage, unspecified: Secondary | ICD-10-CM | POA: Diagnosis not present

## 2023-06-06 HISTORY — PX: BIOPSY: SHX5522

## 2023-06-06 HISTORY — PX: ESOPHAGOGASTRODUODENOSCOPY (EGD) WITH PROPOFOL: SHX5813

## 2023-06-06 HISTORY — PX: HEMOSTASIS CONTROL: SHX6838

## 2023-06-06 LAB — CBC
HCT: 26 % — ABNORMAL LOW (ref 39.0–52.0)
Hemoglobin: 8.8 g/dL — ABNORMAL LOW (ref 13.0–17.0)
MCH: 31.4 pg (ref 26.0–34.0)
MCHC: 33.8 g/dL (ref 30.0–36.0)
MCV: 92.9 fL (ref 80.0–100.0)
Platelets: 92 10*3/uL — ABNORMAL LOW (ref 150–400)
RBC: 2.8 MIL/uL — ABNORMAL LOW (ref 4.22–5.81)
RDW: 13.8 % (ref 11.5–15.5)
WBC: 33.5 10*3/uL — ABNORMAL HIGH (ref 4.0–10.5)
nRBC: 0 % (ref 0.0–0.2)

## 2023-06-06 LAB — BASIC METABOLIC PANEL
Anion gap: 12 (ref 5–15)
Anion gap: 11 (ref 5–15)
Anion gap: 12 (ref 5–15)
BUN: 29 mg/dL — ABNORMAL HIGH (ref 8–23)
BUN: 27 mg/dL — ABNORMAL HIGH (ref 8–23)
BUN: 27 mg/dL — ABNORMAL HIGH (ref 8–23)
CO2: 15 mmol/L — ABNORMAL LOW (ref 22–32)
CO2: 17 mmol/L — ABNORMAL LOW (ref 22–32)
CO2: 14 mmol/L — ABNORMAL LOW (ref 22–32)
Calcium: 6.9 mg/dL — ABNORMAL LOW (ref 8.9–10.3)
Calcium: 7.5 mg/dL — ABNORMAL LOW (ref 8.9–10.3)
Calcium: 6.9 mg/dL — ABNORMAL LOW (ref 8.9–10.3)
Chloride: 100 mmol/L (ref 98–111)
Chloride: 101 mmol/L (ref 98–111)
Chloride: 97 mmol/L — ABNORMAL LOW (ref 98–111)
Creatinine, Ser: 2.02 mg/dL — ABNORMAL HIGH (ref 0.61–1.24)
Creatinine, Ser: 1.89 mg/dL — ABNORMAL HIGH (ref 0.61–1.24)
Creatinine, Ser: 1.8 mg/dL — ABNORMAL HIGH (ref 0.61–1.24)
GFR, Estimated: 34 mL/min — ABNORMAL LOW (ref >60)
GFR, Estimated: 37 mL/min — ABNORMAL LOW (ref >60)
GFR, Estimated: 39 mL/min — ABNORMAL LOW (ref >60)
Glucose, Bld: 330 mg/dL — ABNORMAL HIGH (ref 70–99)
Glucose, Bld: 150 mg/dL — ABNORMAL HIGH (ref 70–99)
Glucose, Bld: 348 mg/dL — ABNORMAL HIGH (ref 70–99)
Potassium: 4.5 mmol/L (ref 3.5–5.1)
Potassium: 4.5 mmol/L (ref 3.5–5.1)
Potassium: 4.8 mmol/L (ref 3.5–5.1)
Sodium: 127 mmol/L — ABNORMAL LOW (ref 135–145)
Sodium: 129 mmol/L — ABNORMAL LOW (ref 135–145)
Sodium: 123 mmol/L — ABNORMAL LOW (ref 135–145)

## 2023-06-06 LAB — VANCOMYCIN, RANDOM: Vancomycin Rm: 11 ug/mL

## 2023-06-06 LAB — MAGNESIUM
Magnesium: 1 mg/dL — ABNORMAL LOW (ref 1.7–2.4)
Magnesium: 1.1 mg/dL — ABNORMAL LOW (ref 1.7–2.4)
Magnesium: 3.1 mg/dL — ABNORMAL HIGH (ref 1.7–2.4)

## 2023-06-06 LAB — COMPREHENSIVE METABOLIC PANEL
ALT: 30 U/L (ref 0–44)
AST: 100 U/L — ABNORMAL HIGH (ref 15–41)
Albumin: <1.5 g/dL — ABNORMAL LOW (ref 3.5–5.0)
Alkaline Phosphatase: 81 U/L (ref 38–126)
Anion gap: 9 (ref 5–15)
BUN: 26 mg/dL — ABNORMAL HIGH (ref 8–23)
CO2: 18 mmol/L — ABNORMAL LOW (ref 22–32)
Calcium: 6.6 mg/dL — ABNORMAL LOW (ref 8.9–10.3)
Chloride: 96 mmol/L — ABNORMAL LOW (ref 98–111)
Creatinine, Ser: 2.17 mg/dL — ABNORMAL HIGH (ref 0.61–1.24)
GFR, Estimated: 31 mL/min — ABNORMAL LOW (ref >60)
Glucose, Bld: 510 mg/dL (ref 70–99)
Potassium: 4 mmol/L (ref 3.5–5.1)
Sodium: 123 mmol/L — ABNORMAL LOW (ref 135–145)
Total Bilirubin: 1.2 mg/dL — ABNORMAL HIGH (ref <1.2)
Total Protein: 4.5 g/dL — ABNORMAL LOW (ref 6.5–8.1)

## 2023-06-06 LAB — CBC WITH DIFFERENTIAL/PLATELET
Abs Immature Granulocytes: 0.27 10*3/uL — ABNORMAL HIGH (ref 0.00–0.07)
Basophils Absolute: 0 10*3/uL (ref 0.0–0.1)
Basophils Relative: 0 %
Eosinophils Absolute: 0 10*3/uL (ref 0.0–0.5)
Eosinophils Relative: 0 %
HCT: 20.2 % — ABNORMAL LOW (ref 39.0–52.0)
Hemoglobin: 6.6 g/dL — CL (ref 13.0–17.0)
Immature Granulocytes: 1 %
Lymphocytes Relative: 2 %
Lymphs Abs: 0.5 10*3/uL — ABNORMAL LOW (ref 0.7–4.0)
MCH: 32 pg (ref 26.0–34.0)
MCHC: 32.7 g/dL (ref 30.0–36.0)
MCV: 98.1 fL (ref 80.0–100.0)
Monocytes Absolute: 1.1 10*3/uL — ABNORMAL HIGH (ref 0.1–1.0)
Monocytes Relative: 4 %
Neutro Abs: 26.6 10*3/uL — ABNORMAL HIGH (ref 1.7–7.7)
Neutrophils Relative %: 93 %
Platelets: 110 10*3/uL — ABNORMAL LOW (ref 150–400)
RBC: 2.06 MIL/uL — ABNORMAL LOW (ref 4.22–5.81)
RDW: 13.2 % (ref 11.5–15.5)
WBC: 28.5 10*3/uL — ABNORMAL HIGH (ref 4.0–10.5)
nRBC: 0 % (ref 0.0–0.2)

## 2023-06-06 LAB — GLUCOSE, CAPILLARY
Glucose-Capillary: 263 mg/dL — ABNORMAL HIGH (ref 70–99)
Glucose-Capillary: 594 mg/dL (ref 70–99)
Glucose-Capillary: >600 mg/dL (ref 70–99)
Glucose-Capillary: 293 mg/dL — ABNORMAL HIGH (ref 70–99)
Glucose-Capillary: 229 mg/dL — ABNORMAL HIGH (ref 70–99)
Glucose-Capillary: 177 mg/dL — ABNORMAL HIGH (ref 70–99)
Glucose-Capillary: 244 mg/dL — ABNORMAL HIGH (ref 70–99)
Glucose-Capillary: 155 mg/dL — ABNORMAL HIGH (ref 70–99)
Glucose-Capillary: 196 mg/dL — ABNORMAL HIGH (ref 70–99)
Glucose-Capillary: 165 mg/dL — ABNORMAL HIGH (ref 70–99)
Glucose-Capillary: 174 mg/dL — ABNORMAL HIGH (ref 70–99)
Glucose-Capillary: 142 mg/dL — ABNORMAL HIGH (ref 70–99)
Glucose-Capillary: 64 mg/dL — ABNORMAL LOW (ref 70–99)
Glucose-Capillary: 87 mg/dL (ref 70–99)
Glucose-Capillary: >600 mg/dL (ref 70–99)
Glucose-Capillary: 29 mg/dL — CL (ref 70–99)
Glucose-Capillary: 85 mg/dL (ref 70–99)
Glucose-Capillary: 142 mg/dL — ABNORMAL HIGH (ref 70–99)
Glucose-Capillary: 218 mg/dL — ABNORMAL HIGH (ref 70–99)
Glucose-Capillary: 490 mg/dL — ABNORMAL HIGH (ref 70–99)
Glucose-Capillary: 232 mg/dL — ABNORMAL HIGH (ref 70–99)

## 2023-06-06 LAB — HEMOGLOBIN AND HEMATOCRIT, BLOOD
HCT: 23.2 % — ABNORMAL LOW (ref 39.0–52.0)
HCT: 25.4 % — ABNORMAL LOW (ref 39.0–52.0)
Hemoglobin: 7.9 g/dL — ABNORMAL LOW (ref 13.0–17.0)
Hemoglobin: 8.6 g/dL — ABNORMAL LOW (ref 13.0–17.0)

## 2023-06-06 LAB — PROTIME-INR
INR: 2.1 — ABNORMAL HIGH (ref 0.8–1.2)
Prothrombin Time: 24.1 s — ABNORMAL HIGH (ref 11.4–15.2)

## 2023-06-06 LAB — PHOSPHORUS
Phosphorus: 2.2 mg/dL — ABNORMAL LOW (ref 2.5–4.6)
Phosphorus: 2.6 mg/dL (ref 2.5–4.6)

## 2023-06-06 LAB — PROCALCITONIN: Procalcitonin: 4.86 ng/mL

## 2023-06-06 LAB — MRSA NEXT GEN BY PCR, NASAL: MRSA by PCR Next Gen: NOT DETECTED

## 2023-06-06 LAB — PREPARE RBC (CROSSMATCH)

## 2023-06-06 LAB — CG4 I-STAT (LACTIC ACID): Lactic Acid, Venous: 4.6 mmol/L (ref 0.5–1.9)

## 2023-06-06 LAB — BETA-HYDROXYBUTYRIC ACID: Beta-Hydroxybutyric Acid: 0.07 mmol/L (ref 0.05–0.27)

## 2023-06-06 LAB — TRIGLYCERIDES: Triglycerides: 43 mg/dL (ref <150)

## 2023-06-06 LAB — LACTIC ACID, PLASMA
Lactic Acid, Venous: 4.8 mmol/L (ref 0.5–1.9)
Lactic Acid, Venous: 4.6 mmol/L (ref 0.5–1.9)
Lactic Acid, Venous: 4 mmol/L (ref 0.5–1.9)

## 2023-06-06 LAB — HEMOGLOBIN A1C
Hgb A1c MFr Bld: 4.9 % (ref 4.8–5.6)
Mean Plasma Glucose: 93.93 mg/dL

## 2023-06-06 SURGERY — ESOPHAGOGASTRODUODENOSCOPY (EGD) WITH PROPOFOL
Anesthesia: Moderate Sedation

## 2023-06-06 MED ORDER — LACTATED RINGERS IV SOLN: INTRAVENOUS | Status: DC

## 2023-06-06 MED ORDER — SODIUM CHLORIDE 0.9% IV SOLUTION: Freq: Once | INTRAVENOUS | Status: AC

## 2023-06-06 MED ORDER — VANCOMYCIN HCL 500 MG/100ML IV SOLN
500.0000 mg | Freq: Once | INTRAVENOUS | Status: AC
  Administered 2023-06-06: 500 mg via INTRAVENOUS
  Filled 2023-06-06: qty 100

## 2023-06-06 MED ORDER — PANTOPRAZOLE SODIUM 40 MG IV SOLR: 40.0000 mg | Freq: Four times a day (QID) | INTRAVENOUS | Status: DC

## 2023-06-06 MED ORDER — PANTOPRAZOLE SODIUM 40 MG IV SOLR
40.0000 mg | Freq: Two times a day (BID) | INTRAVENOUS | Status: DC
  Administered 2023-06-09 – 2023-06-12 (×7): 40 mg via INTRAVENOUS
  Filled 2023-06-06 (×7): qty 10

## 2023-06-06 MED ORDER — DEXTROSE 50 % IV SOLN: 0.0000 mL | INTRAVENOUS | Status: DC | PRN

## 2023-06-06 MED ORDER — PANTOPRAZOLE SODIUM 40 MG IV SOLR: 40.0000 mg | INTRAVENOUS | Status: AC

## 2023-06-06 MED ORDER — PIPERACILLIN-TAZOBACTAM 3.375 G IVPB
3.3750 g | Freq: Three times a day (TID) | INTRAVENOUS | Status: DC
  Administered 2023-06-06 – 2023-06-08 (×6): 3.375 g via INTRAVENOUS
  Filled 2023-06-06 (×6): qty 50

## 2023-06-06 MED ORDER — CALCIUM GLUCONATE-NACL 2-0.675 GM/100ML-% IV SOLN
2.0000 g | Freq: Once | INTRAVENOUS | Status: AC
  Administered 2023-06-06: 2000 mg via INTRAVENOUS
  Filled 2023-06-06: qty 100

## 2023-06-06 MED ORDER — INSULIN ASPART 100 UNIT/ML IJ SOLN
3.0000 [IU] | INTRAMUSCULAR | Status: DC
  Administered 2023-06-06: 3 [IU] via SUBCUTANEOUS
  Administered 2023-06-06: 9 [IU] via SUBCUTANEOUS
  Administered 2023-06-07 (×2): 6 [IU] via SUBCUTANEOUS

## 2023-06-06 MED ORDER — MAGNESIUM SULFATE 4 GM/100ML IV SOLN
4.0000 g | Freq: Once | INTRAVENOUS | Status: AC
  Administered 2023-06-06: 4 g via INTRAVENOUS
  Filled 2023-06-06: qty 100

## 2023-06-06 MED ORDER — PANTOPRAZOLE SODIUM 40 MG IV SOLR
40.0000 mg | Freq: Four times a day (QID) | INTRAVENOUS | Status: AC
  Administered 2023-06-06 – 2023-06-09 (×12): 40 mg via INTRAVENOUS
  Filled 2023-06-06 (×12): qty 10

## 2023-06-06 MED ORDER — MAGNESIUM SULFATE 2 GM/50ML IV SOLN
2.0000 g | Freq: Once | INTRAVENOUS | Status: AC
  Administered 2023-06-06: 2 g via INTRAVENOUS
  Filled 2023-06-06: qty 50

## 2023-06-06 MED ORDER — DEXTROSE IN LACTATED RINGERS 5 % IV SOLN: INTRAVENOUS | Status: DC

## 2023-06-06 MED ORDER — VANCOMYCIN VARIABLE DOSE PER UNSTABLE RENAL FUNCTION (PHARMACIST DOSING)
Status: DC
Start: 2023-06-06 — End: 2023-06-07

## 2023-06-06 MED ORDER — INSULIN DETEMIR 100 UNIT/ML ~~LOC~~ SOLN
8.0000 [IU] | Freq: Two times a day (BID) | SUBCUTANEOUS | Status: DC
  Administered 2023-06-06 – 2023-06-07 (×3): 8 [IU] via SUBCUTANEOUS
  Filled 2023-06-06 (×4): qty 0.08

## 2023-06-06 MED ORDER — PIPERACILLIN-TAZOBACTAM 3.375 G IVPB
3.3750 g | Freq: Three times a day (TID) | INTRAVENOUS | Status: DC
  Administered 2023-06-06: 3.375 g via INTRAVENOUS
  Filled 2023-06-06: qty 50

## 2023-06-06 MED ORDER — INSULIN REGULAR(HUMAN) IN NACL 100-0.9 UT/100ML-% IV SOLN
INTRAVENOUS | Status: DC
  Administered 2023-06-06: 7.5 [IU]/h via INTRAVENOUS
  Filled 2023-06-06: qty 100

## 2023-06-06 SURGICAL SUPPLY — 14 items
BLOCK BITE 60FR ADLT L/F BLUE (MISCELLANEOUS) ×3 IMPLANT
ELECT REM PT RETURN 9FT ADLT (ELECTROSURGICAL)
ELECTRODE REM PT RTRN 9FT ADLT (ELECTROSURGICAL) IMPLANT
FORCEP RJ3 GP 1.8X160 W-NEEDLE (CUTTING FORCEPS) IMPLANT
FORCEPS BIOP RAD 4 LRG CAP 4 (CUTTING FORCEPS) IMPLANT
NDL SCLEROTHERAPY 25GX240 (NEEDLE) IMPLANT
NEEDLE SCLEROTHERAPY 25GX240 (NEEDLE) IMPLANT
PROBE APC STR FIRE (PROBE) IMPLANT
PROBE INJECTION GOLD 7FR (MISCELLANEOUS) IMPLANT
SNARE SHORT THROW 13M SML OVAL (MISCELLANEOUS) IMPLANT
SYR 50ML LL SCALE MARK (SYRINGE) IMPLANT
TUBING ENDO SMARTCAP PENTAX (MISCELLANEOUS) ×6 IMPLANT
TUBING IRRIGATION ENDOGATOR (MISCELLANEOUS) ×3 IMPLANT
WATER STERILE IRR 1000ML POUR (IV SOLUTION) IMPLANT

## 2023-06-06 NOTE — Progress Notes (Signed)
40cc of Versed wasted by this RN and Wynona Luna, RN.

## 2023-06-06 NOTE — Plan of Care (Signed)
  Problem: Education: Goal: Knowledge of General Education information will improve Description: Including pain rating scale, medication(s)/side effects and non-pharmacologic comfort measures Outcome: Not Progressing   Problem: Health Behavior/Discharge Planning: Goal: Ability to manage health-related needs will improve Outcome: Not Progressing   Problem: Clinical Measurements: Goal: Ability to maintain clinical measurements within normal limits will improve Outcome: Not Progressing Goal: Will remain free from infection Outcome: Not Progressing Goal: Diagnostic test results will improve Outcome: Not Progressing Goal: Respiratory complications will improve Outcome: Not Progressing Goal: Cardiovascular complication will be avoided Outcome: Not Progressing   Problem: Activity: Goal: Risk for activity intolerance will decrease Outcome: Not Progressing   Problem: Nutrition: Goal: Adequate nutrition will be maintained Outcome: Not Progressing   Problem: Coping: Goal: Level of anxiety will decrease Outcome: Not Progressing   Problem: Elimination: Goal: Will not experience complications related to bowel motility Outcome: Not Progressing Goal: Will not experience complications related to urinary retention Outcome: Not Progressing   Problem: Pain Management: Goal: General experience of comfort will improve Outcome: Not Progressing   Problem: Safety: Goal: Ability to remain free from injury will improve Outcome: Not Progressing   Problem: Skin Integrity: Goal: Risk for impaired skin integrity will decrease Outcome: Not Progressing   Problem: Education: Goal: Ability to describe self-care measures that may prevent or decrease complications (Diabetes Survival Skills Education) will improve Outcome: Not Progressing Goal: Individualized Educational Video(s) Outcome: Not Progressing   Problem: Coping: Goal: Ability to adjust to condition or change in health will  improve Outcome: Not Progressing   Problem: Fluid Volume: Goal: Ability to maintain a balanced intake and output will improve Outcome: Not Progressing   Problem: Health Behavior/Discharge Planning: Goal: Ability to identify and utilize available resources and services will improve Outcome: Not Progressing Goal: Ability to manage health-related needs will improve Outcome: Not Progressing   Problem: Metabolic: Goal: Ability to maintain appropriate glucose levels will improve Outcome: Not Progressing   Problem: Nutritional: Goal: Maintenance of adequate nutrition will improve Outcome: Not Progressing Goal: Progress toward achieving an optimal weight will improve Outcome: Not Progressing   Problem: Skin Integrity: Goal: Risk for impaired skin integrity will decrease Outcome: Not Progressing   Problem: Tissue Perfusion: Goal: Adequacy of tissue perfusion will improve Outcome: Not Progressing

## 2023-06-06 NOTE — Progress Notes (Addendum)
Pharmacy Antibiotic Note  Jeff Ferrell is a 72 y.o. male admitted on 06/05/2023 with shock, respiratory failure, and AKI, tx'd from Sovah; pt has pus vs feculent material coming out of foley >> tx for septic vs hemorrhagic shock.  Pharmacy has been consulted for vancomycin and Zosyn dosing.  PCN allergy noted; pt did already get a dose of Zosyn at OSH and there were no reported reactions; RN aware to monitor.  Plan: Rec'd vanc 1g and Zosyn 3.375g in afternoon at OSH. Zosyn 3.375g IV Q8H (4-hour infusion). Will hold off on further vanc doses and monitor SCr +/- vanc levels for next dose.  Height: 5\' 9"  (175.3 cm) Weight: 48.5 kg (106 lb 14.8 oz) IBW/kg (Calculated) : 70.7  Temp (24hrs), Avg:96.4 F (35.8 C), Min:96.4 F (35.8 C), Max:96.4 F (35.8 C)   Allergies  Allergen Reactions   Penicillins Hives and Rash    Has patient had a PCN reaction causing immediate rash, facial/tongue/throat swelling, SOB or lightheadedness with hypotension: Yes Has patient had a PCN reaction causing severe rash involving mucus membranes or skin necrosis: Yes   Has patient had a PCN reaction that required hospitalization No Has patient had a PCN reaction occurring within the last 10 years: No If all of the above answers are "NO", then may proceed with Cephalosporin use.     Thank you for allowing pharmacy to be a part of this patient's care.  Vernard Gambles, PharmD, BCPS  06/06/2023 12:17 AM

## 2023-06-06 NOTE — Progress Notes (Signed)
Discussed with Elink about inability to draw back on CVC. Okay for respiratory to proceed with arterial line insertion per Dr. Warrick Parisian.

## 2023-06-06 NOTE — Progress Notes (Signed)
Fentanyl drip removed from pyxis by this RN at 1959 for this patient as temporary patient d/t patient not in [facility] system from [place], [place] for [facility]. Per verbal order from CCM.  2300: Pt returned with Fentanyl   0241: Bag scanned and restarted at this time.

## 2023-06-06 NOTE — Progress Notes (Signed)
Initial Nutrition Assessment  DOCUMENTATION CODES:  Severe malnutrition in context of social or environmental circumstances  INTERVENTION:  When medically able, recommend the following: Tube feeding via OGT: Osmolite 1.2 at 60 ml/h (1440 ml per day) Provides 1728 kcal, 80 gm protein, 1181 ml free water daily When able to start enteral feeds, recommend -1 packet Juven BID, each packet provides 95 calories, 2.5 grams of protein (collagen) When starting nutrition, monitor magnesium and phosphorus every 12 hours x 4 occurrences, MD to replete as needed, as pt is at risk for refeeding syndrome given severe malnutrition. Thiamine 100mg  x 5 days   NUTRITION DIAGNOSIS:  Severe Malnutrition related to social / environmental circumstances (inadequate energy intake) as evidenced by severe fat depletion, severe muscle depletion.  GOAL:  Patient will meet greater than or equal to 90% of their needs  MONITOR:  I & O's, Vent status, Labs, Weight trends  REASON FOR ASSESSMENT:  Ventilator    ASSESSMENT:  Pt with hx of DM type 2, PUD, hx of EtOH, colostomy, and hx pf bilateral AKA presented from outside hospital with shock, respiratory failure, and acute renal failure. Noted that pt was reportedly found down at home 12/17 with dark blood coming from ostomy. Intubated prior to transfer to [facility].   Pt resting in bed at the time of assessment. No family present at bedside. Patient is currently intubated on ventilator support. Requiring pressor support x 2 at high doses. Hypothermic on admission with warming blanket on. Pt with significant muscle and fat depletions suggestive of long-term undernourishment. AKA with ~50% of femur remaining.   Pt currently not hemodynamically stable for enteral feeds, but due to malnutrition would benefit from initiation as soon as feasible. Pt will be at refeeding risk when nutrition is initiated.   MV: 12.1 L/min Temp (24hrs), Avg:96.7 F (35.9 C), Min:96.4 F  (35.8 C), Max:97.8 F (36.6 C) MAP (cuff): 67 mmHg  Admit / Current weight: 48.5 kg   Intake/Output Summary (Last 24 hours) at 06/06/2023 1306 Last data filed at 06/06/2023 1200 Gross per 24 hour  Intake 2900.87 ml  Output 700 ml  Net 2200.87 ml  Net IO Since Admission: 2,200.87 mL [06/06/23 1306]  Drains/Lines: CVC Triple Lumen, left femoral Colostomy RUQ OGT  Nutritionally Relevant Medications: Scheduled Meds:  docusate  100 mg BID   hydrocortisone sod succinate   100 mg Q8H   pantoprazole (PROTONIX) IV  40 mg Q12H   polyethylene glycol  17 g Daily   vancomycin       Continuous Infusions:  calcium gluconate 100 mL/hr at 06/06/23 0800   dextrose 5% lactated ringers 125 mL/hr at 06/06/23 0547   insulin 7.5 Units/hr (06/06/23 0331)   lactated ringers 125 mL/hr at 06/06/23 3244   magnesium sulfate bolus IVPB     norepinephrine (LEVOPHED) Adult infusion 19 mcg/min (06/06/23 0554)   piperacillin-tazobactam (ZOSYN)  IV     propofol (DIPRIVAN) infusion     vasopressin 0.03 Units/min (06/06/23 0800)   Labs Reviewed: Na 127 BUN 29, creatinine 2.02 Mg 1.1 CBG ranges from 155->600 mg/dL over the last 24 hours HgbA1c 4.9%  NUTRITION - FOCUSED PHYSICAL EXAM: Flowsheet Row Most Recent Value  Orbital Region Severe depletion  Upper Arm Region Moderate depletion  Thoracic and Lumbar Region Moderate depletion  Buccal Region Severe depletion  Temple Region Severe depletion  Clavicle Bone Region Mild depletion  Clavicle and Acromion Bone Region Mild depletion  Scapular Bone Region Moderate depletion  Dorsal Hand Severe depletion  Patellar Region Unable to assess  Anterior Thigh Region Unable to assess  Posterior Calf Region Unable to assess  Edema (RD Assessment) Mild  [hands, forearms]  Hair Reviewed  Eyes Reviewed  Mouth Reviewed  Skin Reviewed  Nails Reviewed    Diet Order:   Diet Order             Diet NPO time specified  Diet effective now                    EDUCATION NEEDS:  Not appropriate for education at this time  Skin:  Skin Assessment: Reviewed RN Assessment Stage 3: - Sacrum (2x3 cm) - Back, left upper (3x3 cm) - Back, right upper (2x2 cm) - vertebral column (5x1 cm)  Last BM:  12/18  Height:  Ht Readings from Last 1 Encounters:  06/05/23 5\' 9"  (1.753 m)    Weight:  Wt Readings from Last 1 Encounters:  06/06/23 48.5 kg    Ideal Body Weight:  58.2 kg (adjusted by 20% for bilateral AKA)  BMI:  Adjusted Body mass index is 19.7 kg/m. (Using 12/17 wt and adjusted by 20%)  Estimated Nutritional Needs:  Kcal:  1600-1900 kcal/d Protein:  75-90g/d Fluid:  1.8L/d    Greig Castilla, RD, LDN Registered Dietitian II Please reach out via secure chat Weekend on-call pager # available in [facility]

## 2023-06-06 NOTE — TOC CM/SW Note (Addendum)
    Notified [facility] [phone]

## 2023-06-06 NOTE — Consult Note (Signed)
Consultation  Referring Provider: CCM/Byrum Primary Care Physician:  [name], MD Primary Gastroenterologist:  unassigned  Reason for Consultation:  acute GI bleed  HPI: Jeff Ferrell is a 72 y.o. male, who was transferred here from [place] last night after he had presented there brought in by EMS, found down at home, with dark blood coming from his ostomy, glucose of 67, per notes felt to be cold stiff and bradycardic, required intubation in the ER and pressor support. Initial labs show WBC of 35,000/hemoglobin 8.4 CO2 6.5/sodium 123 BUN 31/creatinine 2.88. Patient has not been responsive, per chart he has history of prior TIA/DVT, peptic ulcer disease, iron deficiency anemia, diabetes mellitus and is status post bilateral BKA's.  Also with history of heavy EtOH use, uncertain whether that is active or not. I do not see any information regarding his abdominal surgeries or colostomies as to reason.  He is now on vancomycin and Zosyn, Requiring Levophed and vasopressin for blood pressure 85/50 He has a small amount of dark red blood coming from the OG, and ostomy with dark red blood, about 250 cc in bag currently. Per nursing his urine is feculent appearing. Also reports several decubiti along his back  Chest x-ray today, new small bilateral effusions left greater than right CT of the head without contrast done report pending  Labs today-pro time 24.1/INR 2.1 Lactate 4.8 Hemoglobin 5 AM was 8.8 up from 6.6 on arrival posttransfusion WBC 33.5/platelets 92 Sodium 127/potassium 4.5/glucose 330/BUN 29/creatinine 2.02       Past Medical History:  Diagnosis Date   Adrenal insufficiency (HCC)    Alcohol use    BPH (benign prostatic hyperplasia)    Chronic back pain    Diabetes mellitus    regulated by diet   Diabetic osteomyelitis (HCC) 03/29/2015   DVT (deep venous thrombosis) (HCC)    IDA (iron deficiency anemia)    MRSA bacteremia    PUD (peptic ulcer  disease)    S/P AKA (above knee amputation) bilateral (HCC)    TIA (transient ischemic attack)     Past Surgical History:  Procedure Laterality Date   AMPUTATION TOE Left 03/30/2015   Procedure: AMPUTATION LEFT GREAT TOE;  Surgeon: Marcene Corning, MD;  Location: [facility];  Service: Orthopedics;  Laterality: Left;   BACK SURGERY     screws & hardware to lower back   FEMUR IM NAIL Left 06/18/2015   Procedure: INTRAMEDULLARY (IM) NAIL FEMORAL;  Surgeon: Tarry Kos, MD;  Location: [facility] OR;  Service: Orthopedics;  Laterality: Left;   FEMUR IM NAIL Right 07/21/2015   Procedure: Intramedullary nail femoral;  Surgeon: Tarry Kos, MD;  Location: [facility] OR;  Service: Orthopedics;  Laterality: Right;   INCISION AND DRAINAGE HIP Right 08/08/2015   Procedure: IRRIGATION AND DEBRIDEMENT RIGHT HIP WITH INCISIONAL WOUND VAC PLACEMENT;  Surgeon: Cammy Copa, MD;  Location: [facility] OR;  Service: Orthopedics;  Laterality: Right;   INCISION AND DRAINAGE HIP Right 08/18/2015   Procedure: IRRIGATION AND DEBRIDEMENT RIGHT HIP, WOUND VAC;  Surgeon: Tarry Kos, MD;  Location: [facility] OR;  Service: Orthopedics;  Laterality: Right;   RADIOLOGY WITH ANESTHESIA N/A 08/12/2015   Procedure: LUMBAR WITH WITHOUT CONTRAST THORACIC WITH WITHOUT CONTRAST (RADIOLOGY WITH ANESTHESIA);  Surgeon: Medication Radiologist, MD;  Location: [facility] OR;  Service: Radiology;  Laterality: N/A;   TEE WITHOUT CARDIOVERSION N/A 08/13/2015   Procedure: TRANSESOPHAGEAL ECHOCARDIOGRAM (TEE);  Surgeon: Chilton Si, MD;  Location: [facility] ENDOSCOPY;  Service: Cardiovascular;  Laterality: N/A;  Prior to Admission medications   Medication Sig Start Date End Date Taking? Authorizing Provider  acetaminophen (TYLENOL) 325 MG tablet Take 2 tablets (650 mg total) by mouth every 6 (six) hours as needed for mild pain (or Fever >/= 101). 07/06/15   Osvaldo Shipper, MD  Amino Acids-Protein Hydrolys (FEEDING SUPPLEMENT, PRO-STAT SUGAR FREE 64,) LIQD Take 30 mLs by mouth 2  (two) times daily. 09/01/15   Hollice Espy, MD  aspirin EC 325 MG EC tablet Take 1 tablet (325 mg total) by mouth daily. 09/01/15   Hollice Espy, MD  baclofen (LIORESAL) 20 MG tablet Take 1 tablet (20 mg total) by mouth 3 (three) times daily. 09/01/15   Hollice Espy, MD  bisacodyl (DULCOLAX) 5 MG EC tablet Take 2 tablets (10 mg total) by mouth daily. 09/01/15   Hollice Espy, MD  diazepam (VALIUM) 5 MG tablet Take 1 tablet (5 mg total) by mouth 2 (two) times daily. 09/01/15   Hollice Espy, MD  feeding supplement, ENSURE ENLIVE, (ENSURE ENLIVE) LIQD Take 237 mLs by mouth 3 (three) times daily between meals. 09/01/15   Hollice Espy, MD  fentaNYL (DURAGESIC - DOSED MCG/HR) 75 MCG/HR Place 1 patch (75 mcg total) onto the skin every 3 (three) days. 09/01/15   Hollice Espy, MD  OLANZapine (ZYPREXA) 5 MG tablet Take 1 tablet (5 mg total) by mouth at bedtime. 09/01/15   Hollice Espy, MD  polyethylene glycol [facility] Ethelene Hal) packet Take 17 g by mouth daily. 09/01/15   Hollice Espy, MD  pregabalin (LYRICA) 100 MG capsule Take 1 capsule (100 mg total) by mouth 2 (two) times daily. 09/01/15   Hollice Espy, MD  rifampin (RIFADIN) 300 MG capsule Take 2 capsules (600 mg total) by mouth daily. 09/01/15   Hollice Espy, MD  tamsulosin (FLOMAX) 0.4 MG CAPS capsule Take 1 capsule (0.4 mg total) by mouth daily. 06/21/15   Catarina Hartshorn, MD    Current Facility-Administered Medications  Medication Dose Route Frequency Provider Last Rate Last Admin   Chlorhexidine Gluconate Cloth 2 % PADS 6 each  6 each Topical Daily Lorin Glass, MD   6 each at 06/05/23 2300   dextrose 50 % solution 0-50 mL  0-50 mL Intravenous PRN Cloyd Stagers M, PA-C       docusate (COLACE) 50 MG/5ML liquid 100 mg  100 mg Per Tube BID Cloyd Stagers M, PA-C       fentaNYL (SUBLIMAZE) bolus via infusion 25-100 mcg  25-100 mcg Intravenous Q15 min PRN Cloyd Stagers M, PA-C   50 mcg at  06/06/23 0800   fentaNYL (SUBLIMAZE) injection 25 mcg  25 mcg Intravenous Once Cloyd Stagers M, PA-C       fentaNYL in NS (36mcg/ml) infusion-PREMIX  25-200 mcg/hr Intravenous Continuous Tim Lair, PA-C 5 mL/hr at 06/06/23 1100 50 mcg/hr at 06/06/23 1100   hydrocortisone sodium succinate (SOLU-CORTEF) 100 MG injection 100 mg  100 mg Intravenous Q8H Cloyd Stagers M, PA-C   100 mg at 06/06/23 0736   insulin aspart (novoLOG) injection 3-9 Units  3-9 Units Subcutaneous Q4H Masters, Katie, DO       insulin detemir (LEVEMIR) injection 8 Units  8 Units Subcutaneous Q12H Masters, Katie, DO       norepinephrine (LEVOPHED) 4mg  in (0.016 mg/mL) premix infusion  0-40 mcg/min Intravenous Titrated Cloyd Stagers M, PA-C 52.5 mL/hr at 06/06/23 1100 14 mcg/min at 06/06/23 1100   Oral  care mouth rinse  15 mL Mouth Rinse Q2H Lorin Glass, MD   15 mL at 06/06/23 1124   Oral care mouth rinse  15 mL Mouth Rinse PRN Lorin Glass, MD       pantoprazole (PROTONIX) injection 40 mg  40 mg Intravenous Q12H Cloyd Stagers M, [place]   40 mg at 06/06/23 0918   piperacillin-tazobactam (ZOSYN) IVPB 3.375 g  3.375 g Intravenous Q8H Paytes, Austin A, RPH 12.5 mL/hr at 06/06/23 1216 3.375 g at 06/06/23 1216   propofol (DIPRIVAN) 1000 MG/100ML infusion  0-50 mcg/kg/min Intravenous Continuous Cloyd Stagers M, PA-C       vancomycin (VANCOREADY) IVPB 500 mg/100 mL  500 mg Intravenous Once Leslye Peer, MD 100 mL/hr at 06/06/23 1142 500 mg at 06/06/23 1142   vancomycin variable dose per unstable renal function (pharmacist dosing)   Does not apply See admin instructions Juliette Mangle, RPH       vasopressin (PITRESSIN) 20 Units in 100 mL (0.2 unit/mL) infusion-*FOR SHOCK*  0-0.04 Units/min Intravenous Continuous Tim Lair, PA-C 9 mL/hr at 06/06/23 1100 0.03 Units/min at 06/06/23 1100    Allergies as of 06/05/2023 - Review Complete 08/26/2015  Allergen Reaction Noted   Penicillins  Hives and Rash 05/01/2011    No family history on file.  Social History   Socioeconomic History   Marital status: Single    Spouse name: Not on file   Number of children: Not on file   Years of education: Not on file   Highest education level: Not on file  Occupational History   Not on file  Tobacco Use   Smoking status: Never   Smokeless tobacco: Not on file  Substance and Sexual Activity   Alcohol use: Yes    Alcohol/week: 6.0 standard drinks of alcohol    Types: 6 Cans of beer per week    Comment: daily   Drug use: No   Sexual activity: Not on file  Other Topics Concern   Not on file  Social History Narrative   Not on file   Social Drivers of Health   Financial Resource Strain: Not on file  Food Insecurity: Not on file  Transportation Needs: Not on file  Physical Activity: Not on file  Stress: Not on file  Social Connections: Not on file  Intimate Partner Violence: Not on file    Review of Systems: Patient unable to offer  Physical Exam: Vital signs in last 24 hours: Temp:  [96.4 F (35.8 C)-97.8 F (36.6 C)] 97.8 F (36.6 C) (12/18 1130) Pulse Rate:  [88-221] 92 (12/18 0900) Resp:  [8-30] 24 (12/18 1130) BP: (60-143)/(48-98) 95/70 (12/18 1130) SpO2:  [81 %-100 %] 100 % (12/18 1130) FiO2 (%):  [30 %] 30 % (12/18 1025) Weight:  [48.5 kg] 48.5 kg (12/18 0500) Last BM Date : 06/06/23 General:   Alert,  Well-developed, acutely ill-appearing, intubated, unresponsive, OG with dark red blood Head:  Normocephalic and atraumatic. Eyes:  Sclera clear, no icterus.   Conjunctiva pale Ears:  Normal auditory acuity. Nose:  No deformity, discharge,  or lesions. Mouth:  No deformity or lesions.   Neck:  Supple; no masses or thyromegaly. Lungs: Forced breath sounds bilaterally  heart:  Regular rate and rhythm; no murmurs, clicks, rubs,  or gallops. Abdomen:  Soft,nontender, bowel sounds quiet,nonpalp mass or hsm, appreciable fluid wave, ostomy in the right lower  quadrant with dark red blood, midline incisional scar and small transverse left lower quadrant scar  Rectal: Not done today Msk:  Symmetrical without gross deformities. . Pulses:  Normal pulses noted. Extremities: bilaterall AKA Neurologic: Obtunded, nonresponsive Skin:  Intact without significant lesions or rashes..   Intake/Output from previous day: 12/17 0701 - 12/18 0700 In: 1615.8 [I.V.:1200.6; Blood:356.7; IV Piggyback:58.5] Out: 450 [Urine:450] Intake/Output this shift: Total I/O In: 1063 [I.V.:818.5; IV Piggyback:244.5] Out: 250 [Urine:250]  Lab Results: Recent Labs    06/05/23 2310 06/05/23 2330 06/06/23 0500  WBC  --  28.5* 33.5*  HGB 8.5* 6.6* 8.8*  HCT 25.0* 20.2* 26.0*  PLT  --  110* 92*   BMET Recent Labs    06/05/23 2310 06/05/23 2330 06/06/23 0500  NA 132* 123* 127*  K 4.4 4.0 4.5  CL  --  96* 100  CO2  --  18* 15*  GLUCOSE  --  510* 330*  BUN  --  26* 29*  CREATININE  --  2.17* 2.02*  CALCIUM  --  6.6* 6.9*   LFT Recent Labs    06/05/23 2330  PROT 4.5*  ALBUMIN <1.5*  AST 100*  ALT 30  ALKPHOS 81  BILITOT 1.2*   PT/INR Recent Labs    06/06/23 0809  LABPROT 24.1*  INR 2.1*   Hepatitis Panel No results for input(s): "HEPBSAG", "HCVAB", "HEPAIGM", "HEPBIGM" in the last 72 hours.   IMPRESSION:  #74 72 year old African-American male admitted early in the morning hours today in transfer from [facility] with shock, acute respiratory failure, acute renal failure, after being found down at home, hypotensive, bradycardic, cold and with dark blood coming from the ostomy. Marked leukocytosis, felt to be septic, covering with Vanco and Zosyn  He is persistently acidotic, and pressor requirement has increased today  Patient is obtunded  #2 acute upper GI bleed-in setting of above, his history is not well-known at this time, there are reports of previous heavy alcohol use, and substance abuse, may be cirrhotic. He continues to have active  bleeding though not major today, and hemoglobin improved posttransfusion up to 8.4 this morning  Cannot rule out variceal bleed, ulcer disease, severe portal gastropathy, esophageal ulceration, Mallory-Weiss  #3 coagulopathy secondary to above question underlying liver disease #4 diabetes mellitus with peripheral neuropathy #5 status post bilateral AKA #6 prior TIA #7 hyponatremia improved #8 status post colostomy, reason not known   PLAN: Continue IV PPI twice daily Consider adding octreotide Will discuss with CCM regarding possible bedside EGD this afternoon with Dr. Rhea Belton. Serial hemoglobins and transfuse as indicated Will start IV vitamin K daily x 3 days GI will follow with you   Chisum Habenicht EsterwoodPA-C  06/06/2023, 12:18 PM

## 2023-06-06 NOTE — Progress Notes (Signed)
Pt transported to and from CT scan on the ventilator without incident.

## 2023-06-06 NOTE — Procedures (Signed)
Arterial Line Insertion Start/End12/18/2024 8:22 PM, 06/06/2023 8:22 PM  Patient location: ICU. Preanesthetic checklist: patient identified, site marked, risks and benefits discussed, monitors and equipment checked and timeout performed Right, radial was placed Catheter size: 20 G Hand hygiene performed  and maximum sterile barriers used   Attempts: 1 Procedure performed without using ultrasound guided technique. Following insertion, Biopatch and dressing applied. Post procedure assessment: normal  Patient tolerated the procedure well with no immediate complications.

## 2023-06-06 NOTE — Progress Notes (Signed)
NAME:  Jeff Ferrell, MRN:  [id number], DOB:  30-Nov-1950, LOS: 1 ADMISSION DATE:  06/05/2023, CONSULTATION DATE:  06/05/23 REFERRING MD:  Malva Cogan transfer, CHIEF COMPLAINT:  shock, acute respiratory failure   History of present illness   72 year old man who presented to [facility] as a transfer from [facility] for shock, acute respiratory failure and acute renal failure. PMHx significant for adrenal insufficiency, TIA, DVT, IDA, PUD, T2DM (c/b diabetic peripheral neuropathy/foot wounds and /;osteomyelitis requiring bilateral AKAs), low back pain 2/2 discitis s/p multilevel laminectomy, BPH.   Patient is intubated/sedated, therefore history is obtained from chart review of records from [facility]. Patient was reportedly found down at home ~0900 12/17 with dark blood coming from ostomy. Glucose borderline low at 67 on EMS arrival and patient was noted to be cold, bradycardic and stiff. Required bagging with EMS due to insufficient respiratory effort. Ultimately required intubation.R femoral central line placed. At [facility], labs were notable for WBC 35, Hgb 8.4. Na 123, CO2 6.5, BUN 31/Cr 2.88. AST 60. Ammonia 86. AG 17. Vanc/Zosyn were initiated as well as bicarbonate gtt. Levophed started for vasopressor support.   Transferred to [facility] for higher level of care.  Past Medical History  Adrenal insufficiency Diabetic OM s/p AKA bilateral IDA, PUD BPH Alcohol use disorder  Significant Hospital Events   12/17 - Presented to [facility] as a transfer from [facility] for shock, ARF. Intubated, on pressors. PCCM consulted for admission.   Interim history/subjective:  Frank pus versus feculent material out of ostomy. Feculent material out of foley. Hypothermic at 96.62F. 1 unit of PRBCs.  Objective   Blood pressure 110/74, pulse 88, temperature (!) 96.4 F (35.8 C), temperature source Axillary, resp. rate (!) 24, height 5\' 9"  (1.753 m), weight 48.5 kg, SpO2 100%.     Vent Mode: PRVC FiO2 (%):  [30 %] 30 % Set Rate:  [24 bmp-28 bmp] 24 bmp Vt Set:  [500 mL] 500 mL PEEP:  [5 cmH20] 5 cmH20 Plateau Pressure:  [18 cmH20-20 cmH20] 20 cmH20   Intake/Output Summary (Last 24 hours) at 06/06/2023 0733 Last data filed at 06/06/2023 0700 Gross per 24 hour  Intake 1615.76 ml  Output --  Net 1615.76 ml   Filed Weights   06/05/23 2256 06/06/23 0500  Weight: 48.5 kg 48.5 kg    Examination: General: Acute-on-chronically ill-appearing elderly man in NAD. HEENT: [place]/AT, anicteric sclera, pupils unequal with R pupil 20mm/nonreactive, L pupil 5mm nonreactive, dry mucous membranes. Neuro:  Intubated, sedated.  Does not respond to verbal, tactile or noxious stimuli. Not following commands. No spontaneous movement of extremities noted. Intermittent weak cough, no gag/no corneals/ . CV: Tachycardic, regular rhythm, no m/g/r. PULM: Breathing even and unlabored on vent (PEEP 5, FiO2 30%). Lung fields diminished at bilateral bases, coarse in upper fields. GI: Soft, nontender, nondistended. RLQ ostomy bag with frank, dark blood with significant clots. Hypoactive bowel sounds. Extremities: Bilateral AKA noted, well-healed. Skin: Warm/dry, multiple malodorous wounds to back/sacrum, PO  ABG 12/17 7.4/26.3/14/118  Na 127 Creatinine 2.02 with baseline of 1 Bicarb 15, AG 12  WBC 28.5-> 33.5 Hgb 6.6->8.8 Platelets 110->92  Corrected calcium at 8.9  Lactic acid 4.6 0400 12/18  CXR 12/17 IMPRESSION: 1. Endotracheal tube tip is 4.1 cm above the carina. 2. New small bilateral pleural effusions, left greater than right.  CT head 12/17: Mild white matter disease with global atrophy. Chronic posterior and left cerebellar infarcts present. No acute intracranial findings.  CT  cervical 12/17: Severe degenerative changes present with multilevel disc osteophyte complexes  CT chest/abd/pelvis 12/17: Multiple chronic bilateral rib fractures present. Liver is diffusely  hyperdense suggestive of steatosis. No evidence of SBP or bowel wall edema   Resolved Hospital Problem list     Assessment & Plan:  72 y.o. year old male with PUD, reported alcohol abuse, adrenal insufficiency  Undifferentiated shock, presume septic (urologic source) versus hemorrhagic Gastrointestinal bleeding, melena Adrenal insufficiency  Continues to have dark maroon output to ostomy with cloudy, white urine in foley. He remains on levophed and vasopressin for blood pressure support. Lactic acid remains elevated this morning at 4.8.  Differentials remains with likely upper GI bleed. He has history of PUD and note mentions alcohol use disorder. He received 1 unit PRBC and hgb at 8.8 this AM. Consulting GI in setting of UGIB. Other differential for shock is infection. He has cloudy white urine in foley. Urine culture sent 12/16. He remains on zosyn and vancomycin.  -consulting GI and palliative care  -levophed with MAP goal >65, continue vasopressin -continue q 6 H/H, transfuse for Hgb <7 or HD significant bleeding -IV PPI BID -continue zosyn, vanc -follow urine and blood cultures -continue stress dose steroids with hydrocortisone 100 mg -echo pending  Uneven pupils Hx of TIA L pupil dilated with non reactive right pupil. He had CT head at [facility]. ED notes does not mention pupillary exam. Unable to elicite brainstem reflex on my exam.   -stat CT head showed no acute abnormalities  Acute hypoxemic respiratory failure  Small left pleural effusion Intubated at [facility] ED 12/17. He does have bilateral pleural effusions L>R. This could be potential source for sepsis as well versus chronic pleural effusion. For now will monitor with serial CXR  - Continue full vent support (4-8cc/kg IBW) - Wean FiO2 for O2 sat > 90% - Daily WUA/SBT, not appropriate at this time due to poor mental status - VAP bundle - Pulmonary hygiene - PAD protocol for sedation: Propofol and Fentanyl for goal  RASS 0 to -1 - CXR in AM   AKI/ARF in the setting of sepsis/ATN AGMA/ NAGMA Creatinine remains elevated at 2 from baseline of 1. Bicarb is 15 with Anion gap of 12. AGMA from lactic acidosis, also checking ketones for DKA. NAGMA likely due to diarrhea from UGIB.  -trend BMP -BHO low, titrate off of endotool -repeat lactic q 3 hrs  Hyperglycemia T2DM Blood sugars elevated in 600s and he was placed on endotool. AG is at 12 with Bicarb of 15.  Checking beta hydroxy this AM. I think AGMA more likely from lactic acidosis as last lactic remained >4.  -BHO low -titrate off of endotool  Thrombocytopenia Platelets of 110 to 92. Platelets within normal limits at baseline. CT abd from outside hospital did mention steatosis. Non know history of cirrhosis. He does have significant alcohol use history. Low platelets in setting of likely acute infection.  -trend CBC  Peripheral neuropathy s/p bilateral AKA Multiple skin wounds including sacral decubitus ulcer, POA -Wound care consult  GOC I called and talked with Roxine Caddy, patient's sister. She is currently in [place] at hospital with her sister who was in a car accident yesterday. She would like to continue getting updates. Patient has 2 sons, and lives close to with Mamie Levers in [place]. She was able to provide contact information for [employer]. I talked with Mamie Levers and he said he found his dad on the floor around 10 AM on  Tuesday. He left at around 10 PM the prior evening. Patient had bleeding last year as well and was sent to [facility] for treatment. Eita Finkler thinks he underwent EGD at that time. Patient was also recently treated for bladder infection. Jake Shark  Best practice:  Diet: NPO Pain/Anxiety/Delirium protocol (if indicated): Fentanyl DVT prophylaxis: none GI prophylaxis: PPI Glucose control: endotool Code Status: full code Family Communication: I talked with Mamie Levers and Wilnette Kales by phone [facility]. Arpita Fentress, D.O.  Internal  Medicine Resident, PGY-3 [facility]  Pager: [phone] 7:33 AM, 06/06/2023

## 2023-06-06 NOTE — TOC Initial Note (Signed)
Transition of Care [facility]) - Initial/Assessment Note    Patient Details  Name: Jeff Ferrell MRN: [id number] Date of Birth: 1951-05-14  Transition of Care [facility]) CM/SW Contact:    Harriet Masson, RN Phone Number: 06/06/2023, 12:57 PM  Clinical Narrative:                  NCM unable to assess patient due to intubation at this time. Patient not medically stable for discharge.  NCM will continue to follow as patient progresses with care towards discharge.   [facility].    Barriers to Discharge: Continued Medical Work up   Patient Goals and CMS Choice            Expected Discharge Plan and Services                                              Prior Living Arrangements/Services                       Activities of Daily Living      Permission Sought/Granted                  Emotional Assessment              Admission diagnosis:  Shock [facility]) [R57.9] Patient Active Problem List   Diagnosis Date Noted   Shock (HCC) 06/05/2023   Palliative care by specialist    Enlarged prostate with urinary obstruction 09/01/2015   ETOH abuse 08/26/2015   Constipation, acute 08/26/2015   Opioid-induced hyperalgesia 08/26/2015   Intractable pain 08/26/2015   Encephalopathy acute    Fever 08/23/2015   Diabetic foot ulcer (HCC)    Pleural effusion    Gluteal abscess    Chest wall abscess    Low back pain    Muscle spasms of both lower extremities    Controlled diabetes mellitus type 2 with complications (HCC)    Hypokalemia    Acute pyelonephritis    Sepsis due to methicillin resistant Staphylococcus aureus (MRSA) (HCC)    Uncontrolled type 2 diabetes mellitus with complication    Acute urinary retention    Hip fracture requiring operative repair [facility])    MRSA bacteremia    Staphylococcus aureus bacteremia with sepsis (HCC)    Osteomyelitis of right hip (HCC)    Hardware complicating wound infection  (HCC)    Infection 08/08/2015   Sepsis (HCC) 08/07/2015   DM type 2, goal HbA1c < 7% (HCC) 08/07/2015   Chronic hyponatremia 08/07/2015   Fracture of femoral neck, right, closed (HCC) 07/19/2015   Closed fracture of neck of right femur, initial encounter (HCC) 07/19/2015   Protein-calorie malnutrition, severe 07/19/2015   Leg weakness, bilateral 07/02/2015   Left leg swelling 07/02/2015   Left-sided chest wall pain    Acute blood loss anemia 06/21/2015   Femur fracture, left (HCC) 06/17/2015   Discitis of lumbar region    PCP:  [name], MD Pharmacy:  No Pharmacies Listed    Social Drivers of Health (SDOH) Social History: SDOH Screenings   Tobacco Use: Unknown (06/05/2023)   SDOH Interventions:     Readmission Risk Interventions     No data to display

## 2023-06-06 NOTE — TOC CM/SW Note (Addendum)
Transition of Care [facility]) - Inpatient Brief Assessment   Patient Details  Name: Jeff Ferrell MRN: [id number] Date of Birth: 28-Oct-1950  [facility] CM/SW Contact:    Marliss Coots, LCSW Phone Number: 06/06/2023, 2:22 PM   Clinical Narrative:  2:22 PM CSW attempted to speak with Dr. Jerre Simon of [facility] regarding patient's confidential chart. [facility] informed CSW that Dr. Clinton Sawyer was out of office today and were unsure of when he is expected to return.  Transition of Care Asessment: Insurance and Status: Insurance coverage has been reviewed Patient has primary care physician: Yes     Prior/Current Home Services: No current home services Social Drivers of Health Review:  (Unable to assess) Readmission risk has been reviewed: Yes Transition of care needs: transition of care needs identified, TOC will continue to follow

## 2023-06-06 NOTE — Plan of Care (Signed)

## 2023-06-06 NOTE — Op Note (Signed)
[facility] Patient Name: Jeff Ferrell Procedure Date : 06/06/2023 MRN: [id number] Attending MD: Beverley Fiedler , MD, [id number] Date of Birth: 1950/08/30 CSN: [id number] Age: 72 Admit Type: Inpatient Procedure:                Upper GI endoscopy Indications:              Hematochezia, Active gastrointestinal bleeding Providers:                Carie Caddy. Rhea Belton, MD, Rozetta Nunnery, Technician,                            Glory Rosebush, RN Referring MD:             [facility]. [place], PCCM Medicines:                Fentanyl infusion per ICU Complications:            No immediate complications. Estimated Blood Loss:     Estimated blood loss: none. Procedure:                Pre-Anesthesia Assessment:                           - Prior to the procedure, a History and Physical                            was performed, and patient medications and                            allergies were reviewed. The patient's tolerance of                            previous anesthesia was also reviewed. The risks                            and benefits of the procedure and the sedation                            options and risks were discussed with the patient.                            All questions were answered, and informed consent                            was obtained. Prior Anticoagulants: The patient has                            taken no anticoagulant or antiplatelet agents. ASA                            Grade Assessment: IV - A patient with severe                            systemic disease that is a constant threat to life.  After reviewing the risks and benefits, the patient                            was deemed in satisfactory condition to undergo the                            procedure.                           After obtaining informed consent, the endoscope was                            passed under direct vision. Throughout the                             procedure, the patient's blood pressure, pulse, and                            oxygen saturations were monitored continuously. The                            [id number]-[id number] ([id number]) Olympus endoscope was introduced                            through the mouth, and advanced to the second part                            of duodenum. The upper GI endoscopy was                            accomplished without difficulty. The patient                            tolerated the procedure well. Scope In: Scope Out: Findings:      The examined esophagus was normal.      One oozing cratered gastric ulcer with adherent clot was found at the       incisura (lesser curve of gastric body). The lesion was 30 mm in largest       dimension. Irrigation and suction was used to dislodge significant       amount of clot, but decision made not to completely unroof given lack of       active bleeding and challenging location. For hemostasis, hemostatic gel       (PuraStat) was deployed. There was no bleeding at the end of the       maneuver. Biopsies were taken with a cold forceps for histology from the       ulcer edges.      Red blood was found in the entire duodenum (as result of gastric ulcer,       but views limited). Impression:               - Normal esophagus.                           - Large, oozing gastric ulcer with adherent clot.  Hemostatic gel applied. Biopsied rule out                            malignancy.                           - Blood in the entire examined duodenum (from                            gastric ulcer). Moderate Sedation:      N/A Recommendation:           - Continue ICU care.                           - NPO.                           - Continue present medications. Continue PPI gtt x                            48-72 hrs, then BID thereafter. Does not need                            octreotide.                           - Await pathology results.                            - No NSAIDs or ASA.                           - If he survives will need repeat EGD in 8 weeks.                           - Goals of care discussion appropriate. If                            full-code and recurrent bleeding then would                            consider IR consult. Procedure Code(s):        --- Professional ---                           43255, 59, Esophagogastroduodenoscopy, flexible,                            transoral; with control of bleeding, any method                           43239, Esophagogastroduodenoscopy, flexible,                            transoral; with biopsy, single or multiple Diagnosis Code(s):        --- Professional ---  K25.4, Chronic or unspecified gastric ulcer with                            hemorrhage                           K92.2, Gastrointestinal hemorrhage, unspecified                           K92.1, Melena (includes Hematochezia) CPT copyright 2022 American Medical Association. All rights reserved. The codes documented in this report are preliminary and upon coder review may  be revised to meet current compliance requirements. Beverley Fiedler, MD 06/06/2023 3:31:50 PM This report has been signed electronically. Number of Addenda: 0

## 2023-06-06 NOTE — Progress Notes (Signed)
Pharmacy Antibiotic Note  Jeff Ferrell is a 72 y.o. male admitted on 06/05/2023 with shock, respiratory failure, and AKI, tx'd from [facility]. Pharmacy has been consulted for vancomycin and Zosyn dosing. PCN allergy - tolerating Zosyn at OSH and here.   Vancomycin random = 11.0 @ 09:26. Received 1000 mg at OSH 12/17 at unclear time. Scr 2.02, 550 mL UOP.  Unclear source of sepsis. Has chronic pleural effusion.   Plan: Vancomycin 500 mg x1 today and obtain random level tomorrow Continue vancomycin variable dose per unstable renal function  Continue Zosyn 3.375g IV Q8H (4-hour infusion)  Height: 5\' 9"  (175.3 cm) Weight: 48.5 kg (106 lb 14.8 oz) IBW/kg (Calculated) : 70.7  Temp (24hrs), Avg:96.5 F (35.8 C), Min:96.4 F (35.8 C), Max:96.9 F (36.1 C)  Recent Labs  Lab 06/05/23 2330 06/06/23 0359 06/06/23 0500 06/06/23 0809 06/06/23 0926  WBC 28.5*  --  33.5*  --   --   CREATININE 2.17*  --  2.02*  --   --   LATICACIDVEN  --  4.6*  --  4.8*  --   VANCORANDOM  --   --   --   --  11    Estimated Creatinine Clearance: 22.3 mL/min (A) (by C-G formula based on SCr of 2.02 mg/dL (H)).    Allergies  Allergen Reactions   Penicillins Hives and Rash    Has patient had a PCN reaction causing immediate rash, facial/tongue/throat swelling, SOB or lightheadedness with hypotension: Yes Has patient had a PCN reaction causing severe rash involving mucus membranes or skin necrosis: Yes   Has patient had a PCN reaction that required hospitalization No Has patient had a PCN reaction occurring within the last 10 years: No If all of the above answers are "NO", then may proceed with Cephalosporin use.     Antimicrobials this admission: Vancomycin 12/17 >>  Zosyn 12/17 >>   Dose adjustments this admission: N/a  Microbiology results: 12/17 MRSA nares neg  12/17 Blood cx sent  12/17 Ucx sent   Thank you for allowing pharmacy to be a part of this patient's care.  Cedric Fishman 06/06/2023 11:19 AM

## 2023-06-07 ENCOUNTER — Other Ambulatory Visit (HOSPITAL_COMMUNITY): Payer: No Typology Code available for payment source

## 2023-06-07 ENCOUNTER — Inpatient Hospital Stay (HOSPITAL_COMMUNITY): Payer: No Typology Code available for payment source

## 2023-06-07 DIAGNOSIS — Z7189 Other specified counseling: Secondary | ICD-10-CM

## 2023-06-07 DIAGNOSIS — I498 Other specified cardiac arrhythmias: Secondary | ICD-10-CM

## 2023-06-07 DIAGNOSIS — Z515 Encounter for palliative care: Secondary | ICD-10-CM | POA: Diagnosis not present

## 2023-06-07 DIAGNOSIS — R579 Shock, unspecified: Secondary | ICD-10-CM | POA: Diagnosis not present

## 2023-06-07 DIAGNOSIS — R9431 Abnormal electrocardiogram [ECG] [EKG]: Secondary | ICD-10-CM | POA: Diagnosis not present

## 2023-06-07 DIAGNOSIS — E119 Type 2 diabetes mellitus without complications: Secondary | ICD-10-CM | POA: Diagnosis not present

## 2023-06-07 DIAGNOSIS — K7460 Unspecified cirrhosis of liver: Secondary | ICD-10-CM | POA: Diagnosis not present

## 2023-06-07 DIAGNOSIS — K250 Acute gastric ulcer with hemorrhage: Secondary | ICD-10-CM | POA: Diagnosis not present

## 2023-06-07 DIAGNOSIS — I5041 Acute combined systolic (congestive) and diastolic (congestive) heart failure: Secondary | ICD-10-CM

## 2023-06-07 DIAGNOSIS — J9601 Acute respiratory failure with hypoxia: Secondary | ICD-10-CM | POA: Diagnosis not present

## 2023-06-07 LAB — TROPONIN I (HIGH SENSITIVITY)
Troponin I (High Sensitivity): 221 ng/L (ref <18)
Troponin I (High Sensitivity): 209 ng/L (ref <18)

## 2023-06-07 LAB — CBC
HCT: 23.4 % — ABNORMAL LOW (ref 39.0–52.0)
HCT: 21.8 % — ABNORMAL LOW (ref 39.0–52.0)
Hemoglobin: 8.2 g/dL — ABNORMAL LOW (ref 13.0–17.0)
Hemoglobin: 7.9 g/dL — ABNORMAL LOW (ref 13.0–17.0)
MCH: 31.5 pg (ref 26.0–34.0)
MCH: 32.1 pg (ref 26.0–34.0)
MCHC: 35 g/dL (ref 30.0–36.0)
MCHC: 36.2 g/dL — ABNORMAL HIGH (ref 30.0–36.0)
MCV: 90 fL (ref 80.0–100.0)
MCV: 88.6 fL (ref 80.0–100.0)
Platelets: 97 10*3/uL — ABNORMAL LOW (ref 150–400)
Platelets: 85 10*3/uL — ABNORMAL LOW (ref 150–400)
RBC: 2.6 MIL/uL — ABNORMAL LOW (ref 4.22–5.81)
RBC: 2.46 MIL/uL — ABNORMAL LOW (ref 4.22–5.81)
RDW: 14.6 % (ref 11.5–15.5)
RDW: 14.6 % (ref 11.5–15.5)
WBC: 29.8 10*3/uL — ABNORMAL HIGH (ref 4.0–10.5)
WBC: 32.1 10*3/uL — ABNORMAL HIGH (ref 4.0–10.5)
nRBC: 0.1 % (ref 0.0–0.2)
nRBC: 0.2 % (ref 0.0–0.2)

## 2023-06-07 LAB — ECHOCARDIOGRAM COMPLETE
Height: 69 in
S' Lateral: 3.3 cm
Single Plane A4C EF: 15.9 %
Weight: 1816.59 [oz_av]

## 2023-06-07 LAB — HEMOGLOBIN AND HEMATOCRIT, BLOOD
HCT: 23.3 % — ABNORMAL LOW (ref 39.0–52.0)
Hemoglobin: 8.2 g/dL — ABNORMAL LOW (ref 13.0–17.0)

## 2023-06-07 LAB — POCT I-STAT 7, (LYTES, BLD GAS, ICA,H+H)
Acid-base deficit: 7 mmol/L — ABNORMAL HIGH (ref 0.0–2.0)
Bicarbonate: 16.2 mmol/L — ABNORMAL LOW (ref 20.0–28.0)
Calcium, Ion: 1.11 mmol/L — ABNORMAL LOW (ref 1.15–1.40)
HCT: 22 % — ABNORMAL LOW (ref 39.0–52.0)
Hemoglobin: 7.5 g/dL — ABNORMAL LOW (ref 13.0–17.0)
O2 Saturation: 99 %
Patient temperature: 97.8
Potassium: 4.1 mmol/L (ref 3.5–5.1)
Sodium: 129 mmol/L — ABNORMAL LOW (ref 135–145)
TCO2: 17 mmol/L — ABNORMAL LOW (ref 22–32)
pCO2 arterial: 22.9 mm[Hg] — ABNORMAL LOW (ref 32–48)
pH, Arterial: 7.456 — ABNORMAL HIGH (ref 7.35–7.45)
pO2, Arterial: 145 mm[Hg] — ABNORMAL HIGH (ref 83–108)

## 2023-06-07 LAB — TYPE AND SCREEN
ABO/RH(D): B POS
Antibody Screen: NEGATIVE
Unit division: 0

## 2023-06-07 LAB — GLUCOSE, CAPILLARY
Glucose-Capillary: 183 mg/dL — ABNORMAL HIGH (ref 70–99)
Glucose-Capillary: 75 mg/dL (ref 70–99)
Glucose-Capillary: 162 mg/dL — ABNORMAL HIGH (ref 70–99)
Glucose-Capillary: 45 mg/dL — ABNORMAL LOW (ref 70–99)
Glucose-Capillary: 90 mg/dL (ref 70–99)
Glucose-Capillary: 45 mg/dL — ABNORMAL LOW (ref 70–99)
Glucose-Capillary: 224 mg/dL — ABNORMAL HIGH (ref 70–99)
Glucose-Capillary: 112 mg/dL — ABNORMAL HIGH (ref 70–99)
Glucose-Capillary: 117 mg/dL — ABNORMAL HIGH (ref 70–99)

## 2023-06-07 LAB — BASIC METABOLIC PANEL
Anion gap: 9 (ref 5–15)
BUN: 31 mg/dL — ABNORMAL HIGH (ref 8–23)
CO2: 16 mmol/L — ABNORMAL LOW (ref 22–32)
Calcium: 7.3 mg/dL — ABNORMAL LOW (ref 8.9–10.3)
Chloride: 101 mmol/L (ref 98–111)
Creatinine, Ser: 2.21 mg/dL — ABNORMAL HIGH (ref 0.61–1.24)
GFR, Estimated: 31 mL/min — ABNORMAL LOW (ref >60)
Glucose, Bld: 192 mg/dL — ABNORMAL HIGH (ref 70–99)
Potassium: 4.8 mmol/L (ref 3.5–5.1)
Sodium: 126 mmol/L — ABNORMAL LOW (ref 135–145)

## 2023-06-07 LAB — BPAM RBC
Blood Product Expiration Date: 202412252359
ISSUE DATE / TIME: 202412180146
Unit Type and Rh: 7300

## 2023-06-07 LAB — MAGNESIUM: Magnesium: 2.8 mg/dL — ABNORMAL HIGH (ref 1.7–2.4)

## 2023-06-07 LAB — LACTIC ACID, PLASMA
Lactic Acid, Venous: 3.4 mmol/L (ref 0.5–1.9)
Lactic Acid, Venous: 2.8 mmol/L (ref 0.5–1.9)

## 2023-06-07 LAB — VANCOMYCIN, RANDOM: Vancomycin Rm: 15 ug/mL

## 2023-06-07 MED ORDER — INSULIN DETEMIR 100 UNIT/ML ~~LOC~~ SOLN
5.0000 [IU] | Freq: Two times a day (BID) | SUBCUTANEOUS | Status: DC
  Filled 2023-06-07: qty 0.05

## 2023-06-07 MED ORDER — FENTANYL CITRATE PF 50 MCG/ML IJ SOSY
25.0000 ug | PREFILLED_SYRINGE | INTRAMUSCULAR | Status: DC | PRN
  Administered 2023-06-08 (×3): 100 ug via INTRAVENOUS
  Administered 2023-06-08: 50 ug via INTRAVENOUS
  Administered 2023-06-08 – 2023-06-09 (×3): 100 ug via INTRAVENOUS
  Administered 2023-06-09: 50 ug via INTRAVENOUS
  Administered 2023-06-10 (×7): 100 ug via INTRAVENOUS
  Filled 2023-06-07: qty 1
  Filled 2023-06-07 (×3): qty 2
  Filled 2023-06-07: qty 1
  Filled 2023-06-07 (×11): qty 2

## 2023-06-07 MED ORDER — ASPIRIN 81 MG PO CHEW: 81.0000 mg | CHEWABLE_TABLET | Freq: Every day | ORAL | Status: DC

## 2023-06-07 MED ORDER — DEXTROSE 5 % IV SOLN: INTRAVENOUS | Status: AC

## 2023-06-07 MED ORDER — INSULIN DETEMIR 100 UNIT/ML ~~LOC~~ SOLN: 5.0000 [IU] | Freq: Every day | SUBCUTANEOUS | Status: DC

## 2023-06-07 MED ORDER — ATORVASTATIN CALCIUM 40 MG PO TABS: 80.0000 mg | ORAL_TABLET | Freq: Every day | ORAL | Status: DC

## 2023-06-07 MED ORDER — PERFLUTREN LIPID MICROSPHERE
1.0000 mL | INTRAVENOUS | Status: AC | PRN
Start: 2023-06-07 — End: 2023-06-07
  Administered 2023-06-07: 4 mL via INTRAVENOUS

## 2023-06-07 MED ORDER — ASPIRIN 325 MG PO TABS: 325.0000 mg | ORAL_TABLET | Freq: Once | ORAL | Status: DC

## 2023-06-07 MED ORDER — DEXTROSE 50 % IV SOLN
INTRAVENOUS | Status: AC
  Administered 2023-06-07: 25 g via INTRAVENOUS
  Filled 2023-06-07: qty 50

## 2023-06-07 MED ORDER — DEXTROSE 50 % IV SOLN: 25.0000 g | INTRAVENOUS | Status: AC

## 2023-06-07 MED ORDER — INSULIN ASPART 100 UNIT/ML IJ SOLN
0.0000 [IU] | INTRAMUSCULAR | Status: DC
  Administered 2023-06-08: 3 [IU] via SUBCUTANEOUS
  Administered 2023-06-08: 2 [IU] via SUBCUTANEOUS

## 2023-06-07 NOTE — Plan of Care (Signed)
  Problem: Education: Goal: Knowledge of General Education information will improve Description: Including pain rating scale, medication(s)/side effects and non-pharmacologic comfort measures Outcome: Not Progressing   Problem: Clinical Measurements: Goal: Ability to maintain clinical measurements within normal limits will improve Outcome: Not Progressing Goal: Will remain free from infection Outcome: Not Progressing Goal: Diagnostic test results will improve Outcome: Not Progressing Goal: Respiratory complications will improve Outcome: Not Progressing Goal: Cardiovascular complication will be avoided Outcome: Not Progressing   Problem: Nutrition: Goal: Adequate nutrition will be maintained Outcome: Not Progressing   Problem: Elimination: Goal: Will not experience complications related to urinary retention Outcome: Not Progressing   Problem: Pain Management: Goal: General experience of comfort will improve Outcome: Not Progressing

## 2023-06-07 NOTE — Progress Notes (Signed)
NAME:  Jeff Ferrell, MRN:  [id number], DOB:  Nov 26, 1950, LOS: 2 ADMISSION DATE:  06/05/2023, CONSULTATION DATE:  06/05/23 REFERRING MD:  Malva Cogan transfer, CHIEF COMPLAINT:  shock, acute respiratory failure   History of present illness   72 year old man who presented to [facility] as a transfer from [facility] for shock, acute respiratory failure and acute renal failure. PMHx significant for adrenal insufficiency, TIA, DVT, IDA, PUD, T2DM (c/b diabetic peripheral neuropathy/foot wounds and /;osteomyelitis requiring bilateral AKAs), low back pain 2/2 discitis s/p multilevel laminectomy, BPH.   Patient is intubated/sedated, therefore history is obtained from chart review of records from [facility]. Patient was reportedly found down at home ~0900 12/17 with dark blood coming from ostomy. Glucose borderline low at 67 on EMS arrival and patient was noted to be cold, bradycardic and stiff. Required bagging with EMS due to insufficient respiratory effort. Ultimately required intubation.R femoral central line placed. At [facility], labs were notable for WBC 35, Hgb 8.4. Na 123, CO2 6.5, BUN 31/Cr 2.88. AST 60. Ammonia 86. AG 17. Vanc/Zosyn were initiated as well as bicarbonate gtt. Levophed started for vasopressor support.   Transferred to [facility] for higher level of care.  Past Medical History  Adrenal insufficiency Diabetic OM s/p AKA bilateral IDA, PUD BPH Alcohol use disorder  Significant Hospital Events   12/17 - Presented to [facility] as a transfer from [facility] [place] for shock, ARF. Intubated, on pressors. PCCM consulted for admission.  12/18- bedside EGD with large oozing ulcer, no varices. Hemostatic gel placed and ulcer base biopsied. CT head negative  Interim history/subjective:  Bedside EGD done yesterday afternoon. Showed large, oozing gastric ulcer with adherent clot. Hemostatic gel applied and site concerning for malignancy was biopsied. A-line placed as could  not draw back on CVC Remained on levophed and vasopressin Telemetry with bigeminy   Off vasopressin this morning. This morning he is withdrawing to pain, has corneal and gag reflex. Pupils remain uneven, however left looks irregular that could be consistent with post-surgical change.   Objective   Blood pressure 108/76, pulse 76, temperature 98.7 F (37.1 C), temperature source Axillary, resp. rate (!) 24, height 5\' 9"  (1.753 m), weight 51.5 kg, SpO2 100%.    Vent Mode: PRVC FiO2 (%):  [30 %] 30 % Set Rate:  [24 bmp] 24 bmp Vt Set:  [500 mL] 500 mL PEEP:  [5 cmH20] 5 cmH20 Plateau Pressure:  [19 cmH20-21 cmH20] 21 cmH20   Intake/Output Summary (Last 24 hours) at 06/07/2023 0615 Last data filed at 06/07/2023 0524 Gross per 24 hour  Intake 3055.74 ml  Output 600 ml  Net 2455.74 ml   Filed Weights   06/05/23 2256 06/06/23 0500 06/07/23 0500  Weight: 48.5 kg 48.5 kg 51.5 kg    Examination: General: Acute-on-chronically ill-appearing elderly man in NAD. HEENT: [place]/AT, anicteric sclera, pupils unequal with R pupil 65mm/nonreactive, L pupil 5mm nonreactive irregular, dry mucous membranes. Neuro:  Intubated, sedated.  Does not respond to verbal stimuli. Does respond to tactile and noxious stimuli. Not following commands. No spontaneous movement of extremities noted. Intermittent weak cough, corneal and gag reflex intact CV: Tachycardic, regular rhythm, no m/g/r. PULM: Breathing even and unlabored on vent . Lung fields diminished at bilateral bases GI: Soft, nontender, nondistended. RLQ ostomy bag with frank, dark blood with significant clots. Hypoactive bowel sounds. Extremities: Bilateral AKA noted, well-healed. Skin: Warm/dry  Na 123->126 Creatinine 1.8-> 2.2  with baseline of 1 K 4.8 BUN 27->31 Bicarb  14->16, AG 9  WBC  33.5-> 29.8 Hgb 8.2-> 8.2 Platelets 92->97  Lactic acid 4 1930 12/18-> 3.4 0641 12/19  Urine cx 12/18: NGTD Blood cx 12/18: NGTD  CXR 12/19: Increased  interstitial edema with mild worsening left pleural effusion  CT head 12/18: No acute abnormalities  CT cervical 12/17: Severe degenerative changes present with multilevel disc osteophyte complexes  CT chest/abd/pelvis 12/17: Multiple chronic bilateral rib fractures present. Liver is diffusely hyperdense suggestive of steatosis. No evidence of SBP or bowel wall edema   Resolved Hospital Problem list   AGMA resolved  Assessment & Plan:  72 y.o. year old male with PUD, reported alcohol abuse, adrenal insufficiency  Shock, presumed septic (urologic source) versus hemorrhagic Gastrointestinal bleeding, melena Gram negative rods UTI Adrenal insufficiency  Shock is multifactorial in setting of UGIB from oozing gastric ulcer and likely sepsis. He underwent EGD 12/18 and hemastatic gel placed on large oozing ulcer. Some concern for malignancy and biopsy taken from base of ulcer. He continues to have some dark stools from ostomy but I think this is old blood. Hgb stable. Urine culture grew out gram negative rods this morning.  - trend CBC q 12 hrs - f/u surgical pathology - stop vancomycin, continue zosyn - GI and palliative medicine consulting, appreciate assistance -levophed with MAP goal >65 -IV PPI BID -follow urine speciation and sensitivities, blood cultures pending -continue stress dose steroids with hydrocortisone 100 mg q 8 hrs  Bigeminy Junctional rhythm Prolonged QTC Telemetry showing bigeminy overnight. This morning EKG with prolonged qtc and what look like dropped beats. K at 4.8. He is acidotic to 16 this morning. ABG with pH at 7.4/ 22/145/ 16.   - check Mg  - cardiology consulted   Acute hypoxemic respiratory failure  Small left pleural effusion Intubated at [facility] ED 12/17. He does have bilateral pleural effusions L>R. For now will monitor with serial CXR, cxr this morning with mild worsening of left effusion. He is acidotic to 16 this morning. ABG with pH at  7.4/ 22/ 145/ 16.   - Continue full vent support (4-8cc/kg IBW) - Wean FiO2 for O2 sat > 90% - Daily WUA/SBT, not appropriate at this time due to poor mental status - VAP bundle - Pulmonary hygiene - PAD protocol for sedation: Propofol and Fentanyl for goal RASS 0 to -1 - CXR in 12/21 AM   AKI/ARF in the setting of sepsis/ATN NAGMA AGMA Creatinine remains elevated at 2 from baseline of 1. Bicarb is 16 with Anion gap of 9.  Lactate has not cleared yet, corrected Anion gap of 15   -trend lactate  Uneven pupils Hx of TIA Pupils remain nonreactive. CT head 12/18 did not show acute change. Weaning off of sedation today  -stat CT head showed no acute abnormalities  T2DM Blood sugars in 70s this morning. He is on stress dose steroids. Placing coretrak today to start feeds.  -decrease levemir to 5 units every day -change SSI from resistant to moderate  Thrombocytopenia Platelets of 110 to 92. Platelets within normal limits at baseline. CT abd from outside hospital did mention steatosis. Non know history of cirrhosis. He does have significant alcohol use history. Low platelets in setting of likely acute infection.  -trend CBC  Peripheral neuropathy s/p bilateral AKA Multiple skin wounds including sacral decubitus ulcer, POA -Wound care consult  GOC - Appreciate palliative assistance. I will call Yu Dearment this afternoon as well.  Best practice:  Diet: NPO Pain/Anxiety/Delirium protocol (if indicated):  Fentanyl DVT prophylaxis: none GI prophylaxis: PPI Glucose control: SSI, levemir Code Status: full code Family Communication: pending  Linnell Swords M. Ji Fairburn, D.O.  Internal Medicine Resident, PGY-3 [facility]  Pager: [phone] 6:15 AM, 06/07/2023

## 2023-06-07 NOTE — Progress Notes (Signed)
Progress Note  Primary GI: Unassigned DOA: 06/05/2023         Hospital Day: 3   Subjective  Chief Complaint: acute GI bleed   No family was present at the time of my evaluation. Patient still intubated,is awake and responding to verbal/ tactile cues however unable to follow commands. Per nurse report has had a small output of dark black liquid from ostomy but otherwise no significant bleeding from ostomy or from OG tube. Patient still on nor epi, off vasopressin.   Objective   Vital signs in last 24 hours: Temp:  [97.1 F (36.2 C)-98.7 F (37.1 C)] 97.1 F (36.2 C) (12/19 1134) Pulse Rate:  [42-98] 91 (12/19 1215) Resp:  [15-25] 24 (12/19 1215) BP: (85-150)/(54-126) 94/54 (12/19 0736) SpO2:  [79 %-100 %] 100 % (12/19 1215) Arterial Line BP: (92-126)/(47-62) 105/55 (12/19 1215) FiO2 (%):  [30 %] 30 % (12/19 0800) Weight:  [51.5 kg] 51.5 kg (12/19 0500) Last BM Date : 06/06/23 Last BM recorded by nurses in past 5 days No data recorded  General:   Acutely ill-appearing, intubated Heart:  Regular rate and rhythm; no murmurs Pulm: Coarse breath sounds bilaterally Abdomen:  Soft, Non-distended AB, Active bowel sounds. No tenderness that I can see to palpation, ostomy right lower quadrant with less than 50 cc liquid black stool Extremities:  without  edema.  Bilateral AKA alert and  oriented x4;  No focal deficits.   Intake/Output from previous day: 12/18 0701 - 12/19 0700 In: 2917.2 [I.V.:2486.8; IV Piggyback:430.5] Out: 600 [Urine:550; Stool:50] Intake/Output this shift: Total I/O In: 292.8 [I.V.:276.1; IV Piggyback:16.6] Out: -   Studies/Results: DG CHEST PORT 1 VIEW Result Date: 06/07/2023 CLINICAL DATA:  Pleural effusion. EXAM: PORTABLE CHEST 1 VIEW COMPARISON:  06/05/2023 FINDINGS: Endotracheal tube tip is 6 cm above the base of the carina. There is progressive atelectasis or infiltrate in both lung bases. Small left and tiny right pleural effusions again noted.  Telemetry leads overlie the chest. IMPRESSION: 1. Progressive atelectasis or infiltrate in both lung bases. 2. Small left and tiny right pleural effusions. Electronically Signed   By: Kennith Center M.D.   On: 06/07/2023 09:54   CT HEAD WO CONTRAST ( ) Result Date: 06/06/2023 CLINICAL DATA:  Anisocoria, pupillary change EXAM: CT HEAD WITHOUT CONTRAST TECHNIQUE: Contiguous axial images were obtained from the base of the skull through the vertex without intravenous contrast. RADIATION DOSE REDUCTION: This exam was performed according to the departmental dose-optimization program which includes automated exposure control, adjustment of the mA and/or kV according to patient size and/or use of iterative reconstruction technique. COMPARISON:  08/24/2015 FINDINGS: Brain: No evidence of acute infarction, hemorrhage, mass, mass effect, or midline shift. No hydrocephalus or extra-axial fluid collection. Age-related cerebral volume loss. 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 Vascular: No hyperdense vessel. Skull: Negative for fracture or focal lesion. Sinuses/Orbits: Mucosal thickening in the maxillary sinuses and ethmoid air cells. Status post bilateral lens replacements. Other: The mastoid air cells are well aerated. IMPRESSION: No acute intracranial process. Electronically Signed   By: Wiliam Ke M.D.   On: 06/06/2023 12:39   DG Chest Port 1 View Result Date: 06/06/2023 CLINICAL DATA:  History of ETT EXAM: PORTABLE CHEST 1 VIEW COMPARISON:  Chest x-ray 08/23/2015 FINDINGS: Endotracheal tube tip is 4.1 cm above the carina. Enteric tube extends below the diaphragm. There are new small bilateral pleural effusions, left greater than right. No focal lung infiltrate or pneumothorax. The cardiomediastinal silhouette is within normal limits.  There are healed left-sided rib fractures. No acute fractures are seen. IMPRESSION: 1. Endotracheal tube tip is 4.1 cm above the carina. 2. New small bilateral pleural effusions,  left greater than right. Electronically Signed   By: Darliss Cheney M.D.   On: 06/06/2023 00:00    Lab Results: Recent Labs    06/05/23 2330 06/06/23 0500 06/06/23 1332 06/07/23 0129 06/07/23 0500 06/07/23 1011  WBC 28.5* 33.5*  --   --  29.8*  --   HGB 6.6* 8.8*   < > 8.2* 8.2* 7.5*  HCT 20.2* 26.0*   < > 23.3* 23.4* 22.0*  PLT 110* 92*  --   --  97*  --    < > = values in this interval not displayed.   BMET Recent Labs    06/06/23 1332 06/06/23 1930 06/07/23 0500 06/07/23 1011  NA 129* 123* 126* 129*  K 4.5 4.8 4.8 4.1  CL 101 97* 101  --   CO2 17* 14* 16*  --   GLUCOSE 150* 348* 192*  --   BUN 27* 27* 31*  --   CREATININE 1.89* 1.80* 2.21*  --   CALCIUM 7.5* 6.9* 7.3*  --    LFT Recent Labs    06/05/23 2330  PROT 4.5*  ALBUMIN <1.5*  AST 100*  ALT 30  ALKPHOS 81  BILITOT 1.2*   PT/INR Recent Labs    06/06/23 0809  LABPROT 24.1*  INR 2.1*     Scheduled Meds:  Chlorhexidine Gluconate Cloth  6 each Topical Daily   docusate  100 mg Per Tube BID   fentaNYL (SUBLIMAZE) injection  25 mcg Intravenous Once   hydrocortisone sod succinate (SOLU-CORTEF) inj  100 mg Intravenous Q8H   insulin aspart  0-15 Units Subcutaneous Q4H   mouth rinse  15 mL Mouth Rinse Q2H   pantoprazole (PROTONIX) IV  40 mg Intravenous Q6H   Followed by   Melene Muller ON 06/09/2023] pantoprazole (PROTONIX) IV  40 mg Intravenous Q12H   Continuous Infusions:  fentaNYL infusion INTRAVENOUS 50 mcg/hr (06/07/23 1200)   norepinephrine (LEVOPHED) Adult infusion 11 mcg/min (06/07/23 1200)   piperacillin-tazobactam (ZOSYN)  IV 12.5 mL/hr at 06/07/23 1200      Patient profile:   72 year old male found down at home transferred to the ICU with septic and hemorrhagic shock. Extensive medical history not all of which is known at this time.    Impression/Plan:   Acute UGI bleed in setting of previous ETOH use, substance abuse and cirrhosis 12/18 bedside EGD showed - Normal esophagus. - Large,  oozing gastric ulcer with adherent clot. Hemostatic gel applied. Biopsied rule out malignancy. - Blood in the entire examined duodenum ( from gastric ulcer) . HGB 7.5 per Istat7 (8.2) 6.6 s/p 1 unit PRBC--> 8.8-->7.9--> 8.6--> 8.2 and most recently 7.5 but with ISTAT7, pending repeat CBC. Patient had minimally black stool from ostomy but hemoglobin has been relatively stable, could be more residual. Any worsening drop in hemoglobin versus worsening output, will plan for repeat endoscopy as patient had rather large oozing gastric ulcer Pending pathology  Cirrhosis with thrombocytopenia AST 100 ALT 30 Alkphos 81 TBili 1.2 INR 06/06/2023 2.1  MELD 3.0: 29 likely worsened secondary to CKD/sepsis  Thrombocytopenia due to cirrhosis  Platelets 97  Septic and hemorrhagic shock with history of adrenal insuff On Zosyn, VC dicontinued Off vasopressin, norepi weaning slowly.  CKD Likely secondary to shock BUN 31 Cr 2.21 Continue to monitor  Acute respiratory failure with hypoxemia  Remains  on ventilation at this time.  Principal Problem:   Shock (HCC) Active Problems:   Acute gastric ulcer with hemorrhage    LOS: 2 days   Doree Albee  06/07/2023, 2:43 PM

## 2023-06-07 NOTE — Progress Notes (Signed)
Hypoglycemic Event  CBG: 45  Treatment: D50 50 mL (25 gm)  Symptoms: None  Follow-up CBG: Time:1541 CBG Result:224  Possible Reasons for Event: Unknown   Annice Pih

## 2023-06-07 NOTE — Consult Note (Addendum)
WOC Nurse Consult Note: Reason for Consult: Consult requested for multiple wounds.  Pt is critically ill with multiple systemic factors which can impair healing, including bilat AKAs which make it difficult to position off the affected areas. He is on a low airloss mattress to reduce pressure.  Ostomy:  Pt has an ostomy with a pouch in place which is not mentioned in the H&P and was performed at another facility; mod amt blood tinged liquid stool.  Stoma is visualized through the pouch and it is red and viable.  3 sets of each supply ordered to the room for staff nurse use. Orders provided as follows for staff nurses to perform: Use barrier ring, [place] # [phone] and pouch [place] # [id number]  Wounds: Middle back with Stage 3 pressure injury; red and moist, approx 2X1X.2cm Left posterior thigh with Stage 3 pressure injury; 3X.3X1cm, mod amt tan drainage Right posterior thigh with Stage 2 pressure injury; .3X.3X.1cm, red and moist Left back with Stage 3 pressure injury, red and moist, 2X1X.1cm Sacrum with 70% yellow, 30% red Stage 3 pressure injury, approx 8X11X.2cm Inner groin/perineum and lower scrotum with red moist macered skin; appearance is consistent with moisture associated skin damage.  Pressure Injury POA: Dressing procedure/placement/frequency: Topical treatment orders provided for bedside nurses to perform as follows:  1. Apply xeroform gauze to sacrum and inner perineum/scrotum Q day.  Cover sacrum with foam dressing and change Q 3 days or PRN soiling 2. Apply piece of Aquacel to left posterior thigh Q day, using swab to fill, then cover with foam dressing.  Change foam dressing Q 3 days or PRN soiling. 3. Foam dressings to middle and left back and right poster thigh, change Q 3 days or PRN soiling Please re-consult if further assistance is needed.  Thank-you,  Cammie Mcgee MSN, RN, CWOCN, [place], CNS [phone]

## 2023-06-07 NOTE — Consult Note (Addendum)
Cardiology Consultation   Patient ID: Jeff Ferrell MRN: [id number]; DOB: 07-10-1950  Admit date: 06/05/2023 Date of Consult: 06/07/2023  PCP:  [name], MD   [facility] Providers Cardiologist:  None        Patient Profile:   Jeff Ferrell is a 72 y.o. male with a hx of diabetes, PVD, osteomyelitis s/p BKA, adrenal insufficiency, EtOH abuse, prior DVT, discitis and multilevel laminectomy  who is being seen 06/07/2023 for the evaluation of arrhythmia at the request of Dr. Sloan Leiter.  History of Present Illness:   [name] was found down at home after last being seen 12 hours prior. He was hypothermic, hypoxic, bradycardic and having frank blood and maroon stools from his ostomy.  He was brought to [facility] in [place], [place] where he was intubated.  Labs revealed significant metabolic acidosis, acute on chronic renal failure, leukocytosis, and transaminitis.  He was started on broad-spectrum antibiotics for combined septic and hemorrhagic shock.  UA was positive for UTI and he had multiple wounds on his back.  He was started on Levophed and bicarbonate.  He was noted to have anisocoria and underwent a head CT which was unremarkable.  Upper endoscopy revealed a large oozing gastric ulcer with an adherent clot.  Hemostatic gel was applied to an area concerning for malignancy.  A biopsy was taken and pathology is pending.  On telemetry he was noted to have frequent episodes of ventricular bigeminy and trigeminy with 4 beats of NSVT.  Echo in 2017 revealed LVEF 60-65%.  Echo this admission revealed LVEF 30-35% with difficulty fully evaluating the endocardial borders.  There was thought to be hypokinesis of the anterior, anteroseptal, inferoseptal, and inferior myocardium.  He had grade 2 diastolic dysfunction.  Right atrial pressure was 15 mmHg.  EKG revealed sinus rhythm with PVCs in a bigeminal pattern and low voltage.  There were nonspecific ST changes.  Patient is  currently intubated and sedated.  Unable to provide history.   Past Medical History:  Diagnosis Date   Adrenal insufficiency (HCC)    Alcohol use    BPH (benign prostatic hyperplasia)    Chronic back pain    Diabetes mellitus    regulated by diet   Diabetic osteomyelitis (HCC) 03/29/2015   DVT (deep venous thrombosis) (HCC)    IDA (iron deficiency anemia)    MRSA bacteremia    PUD (peptic ulcer disease)    S/P AKA (above knee amputation) bilateral (HCC)    TIA (transient ischemic attack)     Past Surgical History:  Procedure Laterality Date   AMPUTATION TOE Left 03/30/2015   Procedure: AMPUTATION LEFT GREAT TOE;  Surgeon: Marcene Corning, MD;  Location: [facility];  Service: Orthopedics;  Laterality: Left;   BACK SURGERY     screws & hardware to lower back   FEMUR IM NAIL Left 06/18/2015   Procedure: INTRAMEDULLARY (IM) NAIL FEMORAL;  Surgeon: Tarry Kos, MD;  Location: [facility] OR;  Service: Orthopedics;  Laterality: Left;   FEMUR IM NAIL Right 07/21/2015   Procedure: Intramedullary nail femoral;  Surgeon: Tarry Kos, MD;  Location: [facility] OR;  Service: Orthopedics;  Laterality: Right;   INCISION AND DRAINAGE HIP Right 08/08/2015   Procedure: IRRIGATION AND DEBRIDEMENT RIGHT HIP WITH INCISIONAL WOUND VAC PLACEMENT;  Surgeon: Cammy Copa, MD;  Location: [facility] OR;  Service: Orthopedics;  Laterality: Right;   INCISION AND DRAINAGE HIP Right 08/18/2015   Procedure: IRRIGATION AND DEBRIDEMENT RIGHT HIP, WOUND VAC;  Surgeon:  Tarry Kos, MD;  Location: [facility] OR;  Service: Orthopedics;  Laterality: Right;   RADIOLOGY WITH ANESTHESIA N/A 08/12/2015   Procedure: LUMBAR WITH WITHOUT CONTRAST THORACIC WITH WITHOUT CONTRAST (RADIOLOGY WITH ANESTHESIA);  Surgeon: Medication Radiologist, MD;  Location: [facility] OR;  Service: Radiology;  Laterality: N/A;   TEE WITHOUT CARDIOVERSION N/A 08/13/2015   Procedure: TRANSESOPHAGEAL ECHOCARDIOGRAM (TEE);  Surgeon: Chilton Si, MD;  Location: [facility] ENDOSCOPY;  Service:  Cardiovascular;  Laterality: N/A;     Home Medications:  Prior to Admission medications   Medication Sig Start Date End Date Taking? Authorizing Provider  acetaminophen (TYLENOL) 325 MG tablet Take 2 tablets (650 mg total) by mouth every 6 (six) hours as needed for mild pain (or Fever >/= 101). 07/06/15   Osvaldo Shipper, MD  Amino Acids-Protein Hydrolys (FEEDING SUPPLEMENT, PRO-STAT SUGAR FREE 64,) LIQD Take 30 mLs by mouth 2 (two) times daily. 09/01/15   Hollice Espy, MD  aspirin EC 325 MG EC tablet Take 1 tablet (325 mg total) by mouth daily. 09/01/15   Hollice Espy, MD  baclofen (LIORESAL) 20 MG tablet Take 1 tablet (20 mg total) by mouth 3 (three) times daily. 09/01/15   Hollice Espy, MD  bisacodyl (DULCOLAX) 5 MG EC tablet Take 2 tablets (10 mg total) by mouth daily. 09/01/15   Hollice Espy, MD  diazepam (VALIUM) 5 MG tablet Take 1 tablet (5 mg total) by mouth 2 (two) times daily. 09/01/15   Hollice Espy, MD  feeding supplement, ENSURE ENLIVE, (ENSURE ENLIVE) LIQD Take 237 mLs by mouth 3 (three) times daily between meals. 09/01/15   Hollice Espy, MD  fentaNYL (DURAGESIC - DOSED MCG/HR) 75 MCG/HR Place 1 patch (75 mcg total) onto the skin every 3 (three) days. 09/01/15   Hollice Espy, MD  OLANZapine (ZYPREXA) 5 MG tablet Take 1 tablet (5 mg total) by mouth at bedtime. 09/01/15   Hollice Espy, MD  polyethylene glycol [facility] / Ethelene Hal) packet Take 17 g by mouth daily. 09/01/15   Hollice Espy, MD  pregabalin (LYRICA) 100 MG capsule Take 1 capsule (100 mg total) by mouth 2 (two) times daily. 09/01/15   Hollice Espy, MD  rifampin (RIFADIN) 300 MG capsule Take 2 capsules (600 mg total) by mouth daily. 09/01/15   Hollice Espy, MD  tamsulosin (FLOMAX) 0.4 MG CAPS capsule Take 1 capsule (0.4 mg total) by mouth daily. 06/21/15   Catarina Hartshorn, MD    Inpatient Medications: Scheduled Meds:  Chlorhexidine Gluconate Cloth  6 each Topical Daily    docusate  100 mg Per Tube BID   fentaNYL (SUBLIMAZE) injection  25 mcg Intravenous Once   hydrocortisone sod succinate (SOLU-CORTEF) inj  100 mg Intravenous Q8H   insulin aspart  0-15 Units Subcutaneous Q4H   mouth rinse  15 mL Mouth Rinse Q2H   pantoprazole (PROTONIX) IV  40 mg Intravenous Q6H   Followed by   Melene Muller ON 06/09/2023] pantoprazole (PROTONIX) IV  40 mg Intravenous Q12H   Continuous Infusions:  dextrose 50 mL/hr at 06/07/23 1700   fentaNYL infusion INTRAVENOUS Stopped (06/07/23 0900)   norepinephrine (LEVOPHED) Adult infusion 9 mcg/min (06/07/23 1700)   piperacillin-tazobactam (ZOSYN)  IV Stopped (06/07/23 1551)   PRN Meds: dextrose, fentaNYL, mouth rinse  Allergies:    Allergies  Allergen Reactions   Penicillins Hives and Rash    Has patient had a PCN reaction causing immediate rash, facial/tongue/throat swelling, SOB or lightheadedness with hypotension: Yes Has patient  had a PCN reaction causing severe rash involving mucus membranes or skin necrosis: Yes   Has patient had a PCN reaction that required hospitalization No Has patient had a PCN reaction occurring within the last 10 years: No If all of the above answers are "NO", then may proceed with Cephalosporin use.     Social History:   Social History   Socioeconomic History   Marital status: Single    Spouse name: Not on file   Number of children: Not on file   Years of education: Not on file   Highest education level: Not on file  Occupational History   Not on file  Tobacco Use   Smoking status: Never   Smokeless tobacco: Not on file  Substance and Sexual Activity   Alcohol use: Yes    Alcohol/week: 6.0 standard drinks of alcohol    Types: 6 Cans of beer per week    Comment: daily   Drug use: No   Sexual activity: Not on file  Other Topics Concern   Not on file  Social History Narrative   Not on file   Social Drivers of Health   Financial Resource Strain: Not on file  Food Insecurity: Not on  file  Transportation Needs: Not on file  Physical Activity: Not on file  Stress: Not on file  Social Connections: Not on file  Intimate Partner Violence: Not on file    Family History:   No family history on file. Patient intubated and sedated.  Unable to provide.   ROS:  Please see the history of present illness.  All other ROS reviewed and negative.     Physical Exam/Data:   Vitals:   06/07/23 1615 06/07/23 1630 06/07/23 1645 06/07/23 1700  BP:      Pulse: 82 77 77 74  Resp: (!) 24 (!) 24 (!) 24 (!) 24  Temp:      TempSrc:      SpO2: 100% 100% 100% 100%  Weight:      Height:        Intake/Output Summary (Last 24 hours) at 06/07/2023 1736 Last data filed at 06/07/2023 1700 Gross per 24 hour  Intake 1462.84 ml  Output 350 ml  Net 1112.84 ml      06/07/2023    5:00 AM 06/06/2023    5:00 AM 06/05/2023   10:56 PM  Last 3 Weights  Weight (lbs) 113 lb 8.6 oz 106 lb 14.8 oz 106 lb 14.8 oz  Weight (kg) 51.5 kg 48.5 kg 48.5 kg   VS:  BP (!) 119/56   Pulse 73   Temp (!) 97.5 F (36.4 C) (Axillary)   Resp (!) 24   Ht 5\' 9"  (1.753 m)   Wt 51.5 kg   SpO2 100%   BMI 16.77 kg/m  , BMI Body mass index is 16.77 kg/m. GENERAL:  Critically ill-appearing.  Intubated HEENT: Pupils equal round and reactive, fundi not visualized, oral mucosa unremarkable NECK:  No jugular venous distention, waveform within normal limits, carotid upstroke brisk and symmetric, no bruits, no thyromegaly LUNGS:  Clear to auscultation bilaterally on anterior exam HEART:  Regularly irregular.  PMI not displaced or sustained,S1 and S2 within normal limits, no S3, no S4, no clicks, no rubs, no murmurs ABD:  Flat, positive bowel sounds normal in frequency in pitch, no bruits, no rebound, no guarding, no midline pulsatile mass, no hepatomegaly, no splenomegaly EXT:  Bilateral AKA SKIN:  No rashes no nodules NEURO: Unable to assess.  Intubated.  PSYCH: Unable to assess.  Intubated.    EKG:  The EKG  was personally reviewed and demonstrates: Sinus rhythm.  Rate 85 bpm.  PVCs in a pattern of ventricular bigeminy.  Low voltage.  Nonspecific T wave abnormalities. Telemetry:  Telemetry was personally reviewed and demonstrates: Sinus rhythm.  PVCs.  PACs.  Ventricular bigeminy.  Ventricular trigeminy.  4 beats NSVT.  Relevant CV Studies:  Echo 06/07/23:  IMPRESSIONS   1. Endocardial borders are challenging to assess. Hypokinesis of the anterior/anteroseptal/inferoseptal/inferior wall from base to apex. Left ventricular ejection fraction, by estimation, is 30 to 35%. The left ventricle has moderately decreased function. The left ventricle demonstrates global hypokinesis. Left ventricular diastolic parameters are consistent with Grade II diastolic dysfunction (pseudonormalization).  2. Right ventricular systolic function was not well visualized. The right ventricular size is normal.  3. The mitral valve is normal in structure. No evidence of mitral valve regurgitation.  4. The aortic valve is grossly normal. Aortic valve regurgitation is not visualized.  5. The inferior vena cava is dilated in size with <50% respiratory variability, suggesting right atrial pressure of 15 mmHg.    Laboratory Data:  High Sensitivity Troponin:  No results for input(s): "TROPONINIHS" in the last 720 hours.   Chemistry Recent Labs  Lab 06/06/23 0500 06/06/23 1332 06/06/23 1930 06/07/23 0500 06/07/23 1011  NA 127* 129* 123* 126* 129*  K 4.5 4.5 4.8 4.8 4.1  CL 100 101 97* 101  --   CO2 15* 17* 14* 16*  --   GLUCOSE 330* 150* 348* 192*  --   BUN 29* 27* 27* 31*  --   CREATININE 2.02* 1.89* 1.80* 2.21*  --   CALCIUM 6.9* 7.5* 6.9* 7.3*  --   MG 1.1*  --  3.1* 2.8*  --   GFRNONAA 34* 37* 39* 31*  --   ANIONGAP 12 11 12 9   --     Recent Labs  Lab 06/05/23 2330  PROT 4.5*  ALBUMIN <1.5*  AST 100*  ALT 30  ALKPHOS 81  BILITOT 1.2*   Lipids  Recent Labs  Lab 06/06/23 0500  TRIG 43     Hematology Recent Labs  Lab 06/06/23 0500 06/06/23 1332 06/07/23 0500 06/07/23 1011 06/07/23 1654  WBC 33.5*  --  29.8*  --  32.1*  RBC 2.80*  --  2.60*  --  2.46*  HGB 8.8*   < > 8.2* 7.5* 7.9*  HCT 26.0*   < > 23.4* 22.0* 21.8*  MCV 92.9  --  90.0  --  88.6  MCH 31.4  --  31.5  --  32.1  MCHC 33.8  --  35.0  --  36.2*  RDW 13.8  --  14.6  --  14.6  PLT 92*  --  97*  --  85*   < > = values in this interval not displayed.   Thyroid No results for input(s): "TSH", "FREET4" in the last 168 hours.  BNPNo results for input(s): "BNP", "PROBNP" in the last 168 hours.  DDimer No results for input(s): "DDIMER" in the last 168 hours.   Radiology/Studies:  ECHOCARDIOGRAM COMPLETE Result Date: 06/07/2023    ECHOCARDIOGRAM REPORT   Patient Name:   SHELLEY BARBIAN Date of Exam: 06/07/2023 Medical Rec #:  [id number]       Height:       69.0 in Accession #:    [id number]      Weight:       113.5 lb  Date of Birth:  16-Apr-1951        BSA:          1.624 m Patient Age:    72 years        BP:           127/62 mmHg Patient Gender: M               HR:           81 bpm. Exam Location:  Inpatient Procedure: 2D Echo, Color Doppler, Cardiac Doppler and Intracardiac            Opacification Agent Indications:    R94.31 Abnormal EKG  History:        Patient has prior history of Echocardiogram examinations, most                 recent 08/13/2015. Risk Factors:Diabetes and ETOH.  Sonographer:    Irving Burton Senior RDCS Referring Phys: [phone] STEPHANIE M REESE IMPRESSIONS  1. Endocardial borders are challenging to assess. Hypokinesis of the anterior/anteroseptal/inferoseptal/inferior wall from base to apex. Left ventricular ejection fraction, by estimation, is 30 to 35%. The left ventricle has moderately decreased function. The left ventricle demonstrates global hypokinesis. Left ventricular diastolic parameters are consistent with Grade II diastolic dysfunction (pseudonormalization).  2. Right ventricular systolic function  was not well visualized. The right ventricular size is normal.  3. The mitral valve is normal in structure. No evidence of mitral valve regurgitation.  4. The aortic valve is grossly normal. Aortic valve regurgitation is not visualized.  5. The inferior vena cava is dilated in size with <50% respiratory variability, suggesting right atrial pressure of 15 mmHg. Conclusion(s)/Recommendation(s): Echo is suggestive of prior infarct. FINDINGS  Left Ventricle: Endocardial borders are challenging to assess. Hypokinesis of the anterior/anteroseptal/inferoseptal/inferior wall from base to apex. Left ventricular ejection fraction, by estimation, is 30 to 35%. The left ventricle has moderately decreased function. The left ventricle demonstrates global hypokinesis. Definity contrast agent was given IV to delineate the left ventricular endocardial borders. The left ventricular internal cavity size was normal in size. There is no left ventricular  hypertrophy. Left ventricular diastolic parameters are consistent with Grade II diastolic dysfunction (pseudonormalization). Right Ventricle: The right ventricular size is normal. Right ventricular systolic function was not well visualized. Left Atrium: Left atrial size was normal in size. Right Atrium: Right atrial size was normal in size. Pericardium: There is no evidence of pericardial effusion. Mitral Valve: The mitral valve is normal in structure. No evidence of mitral valve regurgitation. Tricuspid Valve: Tricuspid valve regurgitation is not demonstrated. Aortic Valve: The aortic valve is grossly normal. Aortic valve regurgitation is not visualized. Pulmonic Valve: Pulmonic valve regurgitation is not visualized. Aorta: The aortic root and ascending aorta are structurally normal, with no evidence of dilitation. Venous: The inferior vena cava is dilated in size with less than 50% respiratory variability, suggesting right atrial pressure of 15 mmHg. IAS/Shunts: The interatrial septum  was not well visualized.  LEFT VENTRICLE PLAX 2D LVIDd:         3.90 cm LVIDs:         3.30 cm LV PW:         0.90 cm LV IVS:        0.80 cm LVOT diam:     1.80 cm LV SV:         14 LV SV Index:   9 LVOT Area:     2.54 cm  LV Volumes (MOD) LV vol  d, MOD A4C: 67.5 ml LV vol s, MOD A4C: 56.8 ml LV SV MOD A4C:     67.5 ml RIGHT VENTRICLE RV S prime:     8.05 cm/s LEFT ATRIUM             Index        RIGHT ATRIUM           Index LA diam:        2.80 cm 1.72 cm/m   RA Area:     11.30 cm LA Vol (A2C):   27.4 ml 16.87 ml/m  RA Volume:   24.00 ml  14.78 ml/m LA Vol (A4C):   46.1 ml 28.38 ml/m LA Biplane Vol: 36.0 ml 22.17 ml/m  AORTIC VALVE LVOT Vmax:   40.75 cm/s LVOT Vmean:  28.750 cm/s LVOT VTI:    0.057 m  AORTA Ao Root diam: 2.90 cm Ao Asc diam:  3.30 cm  SHUNTS Systemic VTI:  0.06 m Systemic Diam: 1.80 cm Carolan Clines Electronically signed by Carolan Clines Signature Date/Time: 06/07/2023/3:43:24 PM    Final    DG CHEST PORT 1 VIEW Result Date: 06/07/2023 CLINICAL DATA:  Pleural effusion. EXAM: PORTABLE CHEST 1 VIEW COMPARISON:  06/05/2023 FINDINGS: Endotracheal tube tip is 6 cm above the base of the carina. There is progressive atelectasis or infiltrate in both lung bases. Small left and tiny right pleural effusions again noted. Telemetry leads overlie the chest. IMPRESSION: 1. Progressive atelectasis or infiltrate in both lung bases. 2. Small left and tiny right pleural effusions. Electronically Signed   By: Kennith Center M.D.   On: 06/07/2023 09:54   CT HEAD WO CONTRAST ( ) Result Date: 06/06/2023 CLINICAL DATA:  Anisocoria, pupillary change EXAM: CT HEAD WITHOUT CONTRAST TECHNIQUE: Contiguous axial images were obtained from the base of the skull through the vertex without intravenous contrast. RADIATION DOSE REDUCTION: This exam was performed according to the departmental dose-optimization program which includes automated exposure control, adjustment of the mA and/or kV according to patient size  and/or use of iterative reconstruction technique. COMPARISON:  08/24/2015 FINDINGS: Brain: No evidence of acute infarction, hemorrhage, mass, mass effect, or midline shift. No hydrocephalus or extra-axial fluid collection. Age-related cerebral volume loss. 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 Vascular: No hyperdense vessel. Skull: Negative for fracture or focal lesion. Sinuses/Orbits: Mucosal thickening in the maxillary sinuses and ethmoid air cells. Status post bilateral lens replacements. Other: The mastoid air cells are well aerated. IMPRESSION: No acute intracranial process. Electronically Signed   By: Wiliam Ke M.D.   On: 06/06/2023 12:39   DG Chest Port 1 View Result Date: 06/06/2023 CLINICAL DATA:  History of ETT EXAM: PORTABLE CHEST 1 VIEW COMPARISON:  Chest x-ray 08/23/2015 FINDINGS: Endotracheal tube tip is 4.1 cm above the carina. Enteric tube extends below the diaphragm. There are new small bilateral pleural effusions, left greater than right. No focal lung infiltrate or pneumothorax. The cardiomediastinal silhouette is within normal limits. There are healed left-sided rib fractures. No acute fractures are seen. IMPRESSION: 1. Endotracheal tube tip is 4.1 cm above the carina. 2. New small bilateral pleural effusions, left greater than right. Electronically Signed   By: Darliss Cheney M.D.   On: 06/06/2023 00:00     Assessment and Plan:   # Acute systolic and diastolic HF:  Echo reveals newly reduced systolic function different from when he last had an echo here in 2017.  There is also focal wall motion abnormality.  Pending stabilization, would recommend an ischemic  evaluation.  Of course this cannot happen until we understand his underlying mental status and improvement in his renal function.  Will add guideline directed medical therapy as able.  He remains on Levophed.  Given the focal wall motion abnormalities will check hs-troponin.  # Mixed septic/hemorrhagic/cardiogenic  shock: Patient remains on pressors as above.  Multiple reasons to be hypotensive including GI bleed, infection, and newly reduced EF, though it is unclear how long his EF has been reduced.  Management per ICU team.  # Ventricular bigeminy: # Ventricular trigeminy:  Suspect that this will continue to correct with treatment of his underlying metabolic derangements, acute blood loss anemia and infection.  Will consider evaluation for ischemia once more medically stable.  Would favor adding a beta blocker when BP permits.     Risk Assessment/Risk Scores:       Total critical care time: 45 minutes. Critical care time was exclusive of separately billable procedures and treating other patients. Critical care was necessary to treat or prevent imminent or life-threatening deterioration. Critical care was time spent personally by me on the following activities: development of treatment plan with patient and/or surrogate as well as nursing, discussions with consultants, evaluation of patient's response to treatment, examination of patient, obtaining history from patient or surrogate, ordering and performing treatments and interventions, ordering and review of laboratory studies, ordering and review of radiographic studies, pulse oximetry and re-evaluation of patient's condition.   For questions or updates, please contact [facility] Please consult [email or web address] for contact info under    Signed, Chilton Si, MD  06/07/2023 5:36 PM

## 2023-06-07 NOTE — Progress Notes (Signed)
Echocardiogram 2D Echocardiogram has been performed.  Warren Lacy Krystofer Hevener RDCS 06/07/2023, 3:28 PM

## 2023-06-07 NOTE — Progress Notes (Signed)
 of Fentanyl infusion wasted with Wilber Bihari, RN.

## 2023-06-07 NOTE — Inpatient Diabetes Management (Signed)
Inpatient Diabetes Program Recommendations  AACE/ADA: New Consensus Statement on Inpatient Glycemic Control (2015)  Target Ranges:  Prepandial:   less than 140 mg/dL      Peak postprandial:   less than 180 mg/dL (1-2 hours)      Critically ill patients:  140 - 180 mg/dL   Lab Results  Component Value Date   GLUCAP 90 06/07/2023   HGBA1C 4.9 06/05/2023    Review of Glycemic Control  Latest Reference Range & Units 06/07/23 03:08 06/07/23 03:18 06/07/23 07:27 06/07/23 11:30 06/07/23 11:33  Glucose-Capillary 70 - 99 mg/dL 409 (H) 75 811 (H) 45 (L) 90  (H): Data is abnormally high (L): Data is abnormally low Diabetes history: Type 2 DM Outpatient Diabetes medications: none Current orders for Inpatient glycemic control: Levemir 5 units BID, Novolog 3-9 units Q4H  Inpatient Diabetes Program Recommendations:    Noted hypoglycemia and associated insulin changes.   Consider: -Further decreasing correction to Novolog 1-3 units Q4H and changing Levemir to every day.  Thanks, Lujean Rave, MSN, RNC-OB Diabetes Coordinator [phone] (8a-5p)

## 2023-06-07 NOTE — Progress Notes (Signed)
Patient's son called and updated by phone. He is planning to drive from [facility] 62/13 to see his father. Planning to be at hospital in the afternoon.  Nyella Eckels M. Lisset Ketchem, D.O.  Internal Medicine Resident, PGY-3 [facility]  Pager: [phone] 4:19 PM, 06/07/2023

## 2023-06-08 ENCOUNTER — Encounter (HOSPITAL_COMMUNITY): Payer: Self-pay | Admitting: Internal Medicine

## 2023-06-08 ENCOUNTER — Inpatient Hospital Stay (HOSPITAL_COMMUNITY): Payer: No Typology Code available for payment source

## 2023-06-08 DIAGNOSIS — K250 Acute gastric ulcer with hemorrhage: Secondary | ICD-10-CM | POA: Diagnosis not present

## 2023-06-08 DIAGNOSIS — Z515 Encounter for palliative care: Secondary | ICD-10-CM | POA: Diagnosis not present

## 2023-06-08 DIAGNOSIS — R579 Shock, unspecified: Secondary | ICD-10-CM | POA: Diagnosis not present

## 2023-06-08 DIAGNOSIS — I5041 Acute combined systolic (congestive) and diastolic (congestive) heart failure: Secondary | ICD-10-CM | POA: Diagnosis not present

## 2023-06-08 LAB — PHOSPHORUS
Phosphorus: 1.7 mg/dL — ABNORMAL LOW (ref 2.5–4.6)
Phosphorus: 1.8 mg/dL — ABNORMAL LOW (ref 2.5–4.6)

## 2023-06-08 LAB — TROPONIN I (HIGH SENSITIVITY)
Troponin I (High Sensitivity): 253 ng/L (ref <18)
Troponin I (High Sensitivity): 284 ng/L (ref <18)
Troponin I (High Sensitivity): 298 ng/L (ref <18)
Troponin I (High Sensitivity): 293 ng/L (ref <18)
Troponin I (High Sensitivity): 296 ng/L (ref <18)

## 2023-06-08 LAB — BASIC METABOLIC PANEL
Anion gap: 12 (ref 5–15)
BUN: 28 mg/dL — ABNORMAL HIGH (ref 8–23)
CO2: 16 mmol/L — ABNORMAL LOW (ref 22–32)
Calcium: 7.4 mg/dL — ABNORMAL LOW (ref 8.9–10.3)
Chloride: 100 mmol/L (ref 98–111)
Creatinine, Ser: 1.72 mg/dL — ABNORMAL HIGH (ref 0.61–1.24)
GFR, Estimated: 41 mL/min — ABNORMAL LOW (ref >60)
Glucose, Bld: 141 mg/dL — ABNORMAL HIGH (ref 70–99)
Potassium: 3.8 mmol/L (ref 3.5–5.1)
Sodium: 128 mmol/L — ABNORMAL LOW (ref 135–145)

## 2023-06-08 LAB — GLUCOSE, CAPILLARY
Glucose-Capillary: 131 mg/dL — ABNORMAL HIGH (ref 70–99)
Glucose-Capillary: 195 mg/dL — ABNORMAL HIGH (ref 70–99)
Glucose-Capillary: 121 mg/dL — ABNORMAL HIGH (ref 70–99)
Glucose-Capillary: 113 mg/dL — ABNORMAL HIGH (ref 70–99)
Glucose-Capillary: 84 mg/dL (ref 70–99)
Glucose-Capillary: 172 mg/dL — ABNORMAL HIGH (ref 70–99)
Glucose-Capillary: 159 mg/dL — ABNORMAL HIGH (ref 70–99)

## 2023-06-08 LAB — CBC
HCT: 21.7 % — ABNORMAL LOW (ref 39.0–52.0)
Hemoglobin: 7.7 g/dL — ABNORMAL LOW (ref 13.0–17.0)
MCH: 31.4 pg (ref 26.0–34.0)
MCHC: 35.5 g/dL (ref 30.0–36.0)
MCV: 88.6 fL (ref 80.0–100.0)
Platelets: 76 10*3/uL — ABNORMAL LOW (ref 150–400)
RBC: 2.45 MIL/uL — ABNORMAL LOW (ref 4.22–5.81)
RDW: 14.6 % (ref 11.5–15.5)
WBC: 31.1 10*3/uL — ABNORMAL HIGH (ref 4.0–10.5)
nRBC: 0.2 % (ref 0.0–0.2)

## 2023-06-08 LAB — MAGNESIUM
Magnesium: 2.2 mg/dL (ref 1.7–2.4)
Magnesium: 2.2 mg/dL (ref 1.7–2.4)

## 2023-06-08 LAB — LACTIC ACID, PLASMA: Lactic Acid, Venous: 2.6 mmol/L (ref 0.5–1.9)

## 2023-06-08 MED ORDER — SODIUM CHLORIDE 0.9 % IV SOLN
1.0000 g | INTRAVENOUS | Status: DC
  Administered 2023-06-08 – 2023-06-09 (×2): 1 g via INTRAVENOUS
  Filled 2023-06-08 (×2): qty 10

## 2023-06-08 MED ORDER — DEXMEDETOMIDINE HCL IN NACL 400 MCG/100ML IV SOLN
0.0000 ug/kg/h | INTRAVENOUS | Status: DC
  Administered 2023-06-08: 0.4 ug/kg/h via INTRAVENOUS
  Administered 2023-06-09: 0.9 ug/kg/h via INTRAVENOUS
  Administered 2023-06-09: 0.5 ug/kg/h via INTRAVENOUS
  Administered 2023-06-10: 0.9 ug/kg/h via INTRAVENOUS
  Administered 2023-06-10: 1 ug/kg/h via INTRAVENOUS
  Filled 2023-06-08 (×5): qty 100

## 2023-06-08 MED ORDER — OSMOLITE 1.2 CAL PO LIQD
1000.0000 mL | ORAL | Status: DC
  Administered 2023-06-08 – 2023-06-11 (×3): 1000 mL
  Filled 2023-06-08 (×8): qty 1000

## 2023-06-08 MED ORDER — INSULIN ASPART 100 UNIT/ML IJ SOLN
0.0000 [IU] | INTRAMUSCULAR | Status: DC
  Administered 2023-06-08 (×2): 2 [IU] via SUBCUTANEOUS
  Administered 2023-06-09: 3 [IU] via SUBCUTANEOUS
  Administered 2023-06-09: 7 [IU] via SUBCUTANEOUS

## 2023-06-08 MED ORDER — THIAMINE MONONITRATE 100 MG PO TABS
100.0000 mg | ORAL_TABLET | Freq: Every day | ORAL | Status: DC
  Administered 2023-06-08 – 2023-06-11 (×4): 100 mg
  Filled 2023-06-08 (×4): qty 1

## 2023-06-08 MED ORDER — JUVEN PO PACK
1.0000 | PACK | Freq: Two times a day (BID) | ORAL | Status: DC
  Administered 2023-06-08 – 2023-06-11 (×7): 1
  Filled 2023-06-08 (×7): qty 1

## 2023-06-08 NOTE — Consult Note (Signed)
Palliative Care Consult Note                                  Date: 06/08/2023   Patient Name: [facility]  DOB: 1951-04-05  MRN: [id number]  Age / Sex: 72 y.o., male  PCP: Jeff Floro, MD Referring Physician: Charlott Holler, MD  Reason for Consultation: Establishing goals of care  HPI/Patient Profile: 71 y.o. male  with past medical history of type 2 diabetes (complicated by diabetic peripheral neuropathy/foot wound/osteomyelitis), bilateral AKA's, TIA, DVT, IDA, PUD, adrenal insufficiency, low back pain secondary to discitis status post multilevel laminectomy, and BPH.  He was admitted to [facility] a transfer from [facility] in [place] on 06/05/2023 with shock, acute respiratory failure, and acute renal failure.  He was reportedly found down at home with dark blood coming from ostomy.  On EMS arrival patient was noted to be cold, bradycardic, and stiff.  He ultimately required intubation and vasopressor support.  On 12/18, bedside EGD showed large oozing ulcer, no varices.  Palliative Medicine has been consulted for goals of care and medical decision making.  Past Medical History:  Diagnosis Date   Adrenal insufficiency (HCC)    Alcohol use    BPH (benign prostatic hyperplasia)    Chronic back pain    Diabetes mellitus    regulated by diet   Diabetic osteomyelitis (HCC) 03/29/2015   DVT (deep venous thrombosis) (HCC)    IDA (iron deficiency anemia)    MRSA bacteremia    PUD (peptic ulcer disease)    S/P AKA (above knee amputation) bilateral (HCC)    TIA (transient ischemic attack)     Subjective:   I have reviewed medical records including EPIC notes, labs and imaging, discussed with ICU team, and assessed the patient at bedside.  Patient is intubated and remains on vasopressor support. He is not following commands but does respond to tactile and noxious stimuli.  I spoke with his son/[facility] (his preferred name is  "[facility]") by phone  to discuss diagnosis, prognosis, GOC, EOL wishes, disposition, and options.    Patient is known to PMT from his hospitalization at [facility] in 2017.  He was s/p recent ORIF and admitted with an infected right hip wound.  This hospitalization was complicated by encephalopathy, severe urinary retention, and complex severe pain that was difficult to control without oversedation.  Patient was eventually discharged on IV antibiotics to live with his son in [place].  I re-introduced Palliative Medicine as specialized medical care for people living with serious illness. It focuses on providing relief from the symptoms and stress of a serious illness.   Created space and opportunity for son to express thoughts and feelings regarding current medical situation. Values and goals of care were attempted to be elicited.  Life Review: Patient is an Designer, television/film set.  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  He has 2 sons.  He also has 3 sisters who is very close to.  His son describes him as active, independent, and stubborn.  He enjoys painting and gardening.  Functional Status: Prior to admission, patient lived alone at home and was essentially independent.  He was able to transfer himself to his power chair and could mobilize himself around the house as well as to a nearby store (about 1/2 mile away).   Today's Discussion: We discussed patient's current illness and what it means  in the larger context of his ongoing co-morbidities. Current clinical status was reviewed. Natural disease trajectory of chronic illness was discussed.  We reviewed patient's complicated hospitalization in 2017.  Son states family was told at that time that "he had 6 months", but then he recovered. At discharge, he went to live with son/[facility] in [place], [place]. He was apparently bedbound for a period of time time, but eventually was able to live alone and has been independent for the most part.  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 He is agreeable to a family meeting in the next few days.   Discussed the importance of continued conversation with family and their medical team regarding overall plan of care and treatment options.   Review of Systems  Unable to perform ROS   Objective:   Primary Diagnoses: Present on Admission:  Shock [facility])   Physical Exam Vitals reviewed.  Constitutional:      General: He is not in acute distress.    Appearance: He is ill-appearing.     Interventions: He is sedated.  Pulmonary:     Comments: intubated Musculoskeletal:     Right Lower Extremity: Right leg is amputated above knee.     Left Lower Extremity: Left leg is amputated above knee.  Neurological:     Comments: Not following commands     Vital Signs:  BP (!) 119/54   Pulse 79   Temp (!) 96.3 F (35.7 C) (Axillary)   Resp 11   Ht 5\' 9"  (1.753 m)   Wt 52 kg   SpO2 100%   BMI 16.93 kg/m   Palliative Assessment/Data: PPS 10%     Assessment & Plan:   SUMMARY OF RECOMMENDATIONS   Continue full scope care Goal of care is medical stabilization and recovery Tentative plan for family meeting tomorrow afternoon  Primary Decision Maker: NEXT OF KIN - 2 sons  Code Status/Advance Care Planning: Full code  Prognosis:  Unable to determine  Discharge Planning:  To Be Determined   Discussed with: Dr. Sloan Leiter  Thank you for allowing [place] to participate in the care of [facility]  Time Total: 75 minutes  Detailed review of medical records (labs, imaging, vital signs), medically appropriate exam, discussed with treatment team, counseling and education to family, & staff, documenting clinical information, coordination of care.  Signed by: Sherlean Foot, NP Palliative Medicine Team  Team Phone # [phone]  For individual providers, please see AMION

## 2023-06-08 NOTE — Progress Notes (Signed)
Palliative Medicine Progress Note   Patient Name: [facility]       Date: 06/08/2023 DOB: April 08, 1951  Age: 72 y.o. MRN#: [id number] Attending Physician: Charlott Holler, MD Primary Care Physician: [name], MD Admit Date: 06/05/2023  Reason for Consultation/Follow-up: {Reason for Consult:23484}  HPI/Patient Profile: 72 y.o. male  with past medical history of type 2 diabetes (complicated by diabetic peripheral neuropathy/foot wound/osteomyelitis), bilateral AKA's, TIA, DVT, IDA, PUD, adrenal insufficiency, low back pain secondary to discitis status post multilevel laminectomy, and BPH.  He was admitted to [facility] as a transfer from [facility] in [place] on 06/05/2023 with shock, acute respiratory failure, and acute renal failure.  He was reportedly found down at home with dark blood coming from ostomy.  On EMS arrival patient was noted to be cold, bradycardic, and stiff.  He ultimately required intubation and vasopressor support.  On 12/18, bedside EGD showed large oozing ulcer, no varices.   Palliative Medicine has been consulted for goals of care and medical decision making.  Subjective: ***  Myself and Dr. Sloan Leiter met with son/[name] at in the [facility] conference room.  Provided updates on patient's current clinical status, and offered education on the seriousness of his acute illness.  Reviewed patient's acute conditions of acute respiratory failure, GI bleeding (stabilized), need for vasopressor support, and acute heart failure with reduced EF.  Objective:  Physical Exam          Vital Signs: BP (!) 119/52   Pulse 79   Temp (!) 97.3 F (36.3 C) (Oral)   Resp (!) 24   Ht 5\' 9"  (1.753 m)   Wt 52 kg   SpO2 100%   BMI 16.93 kg/m  SpO2: SpO2: 100 % O2 Device: O2 Device:  Ventilator O2 Flow Rate:    Intake/output summary:  Intake/Output Summary (Last 24 hours) at 06/08/2023 2208 Last data filed at 06/08/2023 2000 Gross per 24 hour  Intake 1369.11 ml  Output 2150 ml  Net -780.89 ml    LBM: Last BM Date : 06/08/23     Palliative Assessment/Data: ***     Palliative Medicine Assessment & Plan   Assessment: Principal Problem:   Shock [facility]) Active Problems:   Acute gastric ulcer with hemorrhage   Acute combined systolic and diastolic heart failure (HCC)   Ventricular bigeminy  Recommendations/Plan: Continue full scope interventions Family is hopeful for medical stabilization and improvement PMT will continue to follow  Goals of Care and Additional Recommendations: Limitations on Scope of Treatment: {Recommended Scope and Preferences:21019}  Code Status:   Prognosis:  {Palliative Care Prognosis:23504}  Discharge Planning: {Palliative dispostion:23505}  Care plan was discussed with ***  Thank you for allowing the Palliative Medicine Team to assist in the care of this patient.   ***   Merry Proud, NP   Please contact Palliative Medicine Team phone at [phone] for questions and concerns.  For individual providers, please see AMION.

## 2023-06-08 NOTE — Procedures (Signed)
Cortrak  Person Inserting Tube:  Karsynn Deweese C, RD Tube Type:  Cortrak - 43 inches Tube Size:  10 Tube Location:  Left nare Secured by: Bridle Technique Used to Measure Tube Placement:  Marking at nare/corner of mouth Cortrak Secured At:  72 cm   Cortrak Tube Team Note:  Consult received to place a Cortrak feeding tube.   X-ray is required, abdominal x-ray has been ordered by the Cortrak team. Please confirm tube placement before using the Cortrak tube.   If the tube becomes dislodged please keep the tube and contact the Cortrak team at [email or web address] for replacement.  If after hours and replacement cannot be delayed, place a NG tube and confirm placement with an abdominal x-ray.    Jadalynn Burr P., RD, LDN, CNSC See AMiON for contact information    

## 2023-06-08 NOTE — Progress Notes (Signed)
NAME:  [facility], MRN:  [id number], DOB:  1951-06-13, LOS: 3 ADMISSION DATE:  06/05/2023, CONSULTATION DATE:  06/05/23 REFERRING MD:  Malva Cogan transfer, CHIEF COMPLAINT:  shock, acute respiratory failure   History of present illness   72 year old man who presented to [facility] as a transfer from [facility] for shock, acute respiratory failure and acute renal failure. PMHx significant for adrenal insufficiency, TIA, DVT, IDA, PUD, T2DM (c/b diabetic peripheral neuropathy/foot wounds and /;osteomyelitis requiring bilateral AKAs), low back pain 2/2 discitis s/p multilevel laminectomy, BPH.   Patient is intubated/sedated, therefore history is obtained from chart review of records from [facility]. Patient was reportedly found down at home ~0900 12/17 with dark blood coming from ostomy. Glucose borderline low at 67 on EMS arrival and patient was noted to be cold, bradycardic and stiff. Required bagging with EMS due to insufficient respiratory effort. Ultimately required intubation.R femoral central line placed. At [facility], labs were notable for WBC 35, Hgb 8.4. Na 123, CO2 6.5, BUN 31/Cr 2.88. AST 60. Ammonia 86. AG 17. Vanc/Zosyn were initiated as well as bicarbonate gtt. Levophed started for vasopressor support.   Transferred to [facility] for higher level of care.  Past Medical History  Adrenal insufficiency Diabetic OM s/p AKA bilateral IDA, PUD BPH Alcohol use disorder  Significant Hospital Events   12/17 - Presented to [facility] as a transfer from [facility] for shock, ARF. Intubated, on pressors. PCCM consulted for admission.  12/18- bedside EGD with large oozing ulcer, no varices. Hemostatic gel placed and ulcer base biopsied. CT head negative 12/19- echo with EF 30-35%, apical hypokinesis  Interim history/subjective:  Echo with EF 30-35% with apical hypokinesis and focal wall motion abnormalities. Hypoglycemic yesterday afternoon to 45.  Opens eyes to  voice, unable to follow commands, remains intubated. Apneic episodes when switched to pressure support. Off sedation since 2100 12/19. Remains on levophed at 3 mcg.  Objective   Blood pressure (!) 119/56, pulse 68, temperature 97.7 F (36.5 C), temperature source Oral, resp. rate (!) 24, height 5\' 9"  (1.753 m), weight 52 kg, SpO2 100%.    Vent Mode: PRVC FiO2 (%):  [30 %] 30 % Set Rate:  [24 bmp] 24 bmp Vt Set:  [500 mL] 500 mL PEEP:  [5 cmH20] 5 cmH20 Plateau Pressure:  [19 cmH20-24 cmH20] 20 cmH20   Intake/Output Summary (Last 24 hours) at 06/08/2023 0865 Last data filed at 06/08/2023 0600 Gross per 24 hour  Intake 1605.12 ml  Output 1550 ml  Net 55.12 ml   Filed Weights   06/06/23 0500 06/07/23 0500 06/08/23 0349  Weight: 48.5 kg 51.5 kg 52 kg    Examination: General: Acute-on-chronically ill-appearing elderly man in NAD. HEENT: [place]/AT, anicteric sclera, pupils unequal with R pupil 83mm/nonreactive, L pupil 5mm nonreactive irregular, dry mucous membranes. Neuro:  Intubated.  Opens eyes to voice and touch, grimaces, biting on tube CV:  regular rhythm, no m/g/r. PULM: Breathing even and unlabored on vent . Lung fields diminished at bilateral bases GI: Soft, nontender, nondistended. RLQ ostomy bag with frank, dark blood with significant clots. Hypoactive bowel sounds. Extremities: Bilateral AKA noted, well-healed. Skin: Warm/dry  Na 129->128 Creatinine 2.2-> 1.7  with baseline of 1 K 3.8 BUN 31->28 Bicarb 16->16  WBC  32.1->31.1 Hgb 8.2-> 8.2 Platelets 97->767  Troponin 298-> 293  Urine cx 12/18:>100,000 E coli Blood cx 12/18: NGTD  CXR 12/19: Increased interstitial edema with mild worsening left pleural effusion  CT head 12/18:  No acute abnormalities  CT cervical 12/17: Severe degenerative changes present with multilevel disc osteophyte complexes  CT chest/abd/pelvis 12/17: Multiple chronic bilateral rib fractures present. Liver is diffusely hyperdense  suggestive of steatosis. No evidence of SBP or bowel wall edema   Resolved Hospital Problem list   AGMA resolved  Assessment & Plan:  72 y.o. year old male with PUD, reported alcohol abuse, adrenal insufficiency  Shock, multifactorial 2/2 septic (urologic source) versus hemorrhagic versus cardiogenic Gastrointestinal bleeding, melena Gram negative rods UTI Newly reduced EF Adrenal insufficiency  Shock is multifactorial in setting of UGIB from oozing gastric ulcer and sepsis and likely cardiogenic shock with new reduced EF. He underwent EGD 12/18 and hemastatic gel placed on large oozing ulcer. Some concern for malignancy and biopsy taken from base of ulcer. He continues to have some dark stools from ostomy but I think this is old blood. Hgb stable. Urine culture grew out gram negative rods this morning.  Echo with new reduced EF to 30 to 35% with focal abnormalities of wall concerning for ischemia.  Troponins were elevated in 200s, peaked at 298 overnight.  Remains on low-dose Levophed  -Trend CBC, f/u surgical pathology - de-escalate zosyn to ceftriaxone, day 4 -levophed with MAP goal >65 -IV PPI BID -follow urine sensitivities, blood cultures ngtd -continue stress dose steroids with hydrocortisone 100 mg q 8 hrs  Ecoli UTI Treating with ceftriaxone day 5  Gastrointestinal bleeding, melena Large gastric ulcer with visible vessel seen on EGD 12/18, ulcer concerning for malignancy and biopsy was taken.  If rebleeds GI would recommend upper endoscopy versus consulting IR for embolization.  -Continue pantoprazole 40 mg IV every 6 hours -Follow-up pathology results  HFrEF Ventricular bigeminy Echo with EF of 30 to 35%.  There was hypokinesis of focal wall from base to apex.  Cardiology evaluated him 12/19.  Pending stabilization they would recommend doing an ischemic evaluation.  With ventricular bi and trigeminy cards would recommend adding a beta-blocker once blood pressure allows.   Troponin elevated but peaked at 298.  -Appreciate cardiology recommendations -Remains on Levophed -Consider adding beta-blocker and additional GDMT once able -Ischemic evaluation pending stabilization  Acute hypoxemic respiratory failure  Small left pleural effusion Intubated at [facility] ED 12/17. He does have bilateral pleural effusions L>R. For now will monitor with serial CXR, cxr 12/21 ordered  - Continue full vent support (4-8cc/kg IBW) - Wean FiO2 for O2 sat > 90% - Daily WUA/SBT, not appropriate at this time due to poor mental status - VAP bundle - Pulmonary hygiene - PAD protocol for sedation: Propofol and Fentanyl for goal RASS 0 to -1 - CXR in 12/21 AM   AKI/ARF in the setting of sepsis/ATN NAGMA AGMA Renal function improved at 1.7 today.  He continues to have significant acidosis with bicarb of 16 that is both anion gap and non-anion gap. Lactate was improving yesterday.  -trend lactate  Uneven pupils Hx of TIA Pupils remain nonreactive. CT head 12/18 did not show acute change. Weaning off of sedation today, mentation is improved today.   -stat CT head showed no acute abnormalities  T2DM Hypoglycemic overnight to 45.  He is to have core track placed today and will start tube feeds.  -place coretrak -continue D5 at 500 cc/h -Stop long-acting insulin -Moderate SSI  Thrombocytopenia, reactive Platelets of 92 to 67 Platelets within normal limits at baseline. CT abd from outside hospital did mention steatosis. Non know history of cirrhosis. He does have significant alcohol use  history. Low platelets in setting of likely acute infection.  -trend CBC  Peripheral neuropathy s/p bilateral AKA Multiple skin wounds including sacral decubitus ulcer, POA -Wound care consult  GOC - Appreciate palliative assistance. Asaad the III to come this afternoon,  Best practice:  Diet: NPO, placing coretrak Pain/Anxiety/Delirium protocol (if indicated): Fentanyl DVT  prophylaxis: none GI prophylaxis: PPI Glucose control: SSI, sensitive Code Status: full code Family Communication: pending  Ammaar Encina M. Lacie Landry, D.O.  Internal Medicine Resident, PGY-3 [facility]  Pager: [phone] 6:38 AM, 06/08/2023

## 2023-06-08 NOTE — Progress Notes (Signed)
Rounding Note    Patient Name: Jeff Ferrell Date of Encounter: 06/08/2023  [facility] Cardiologist: None   Subjective   Unable to assess.  Intubated and sedated.  Inpatient Medications    Scheduled Meds:  Chlorhexidine Gluconate Cloth  6 each Topical Daily   docusate  100 mg Per Tube BID   hydrocortisone sod succinate (SOLU-CORTEF) inj  100 mg Intravenous Q8H   insulin aspart  0-9 Units Subcutaneous Q4H   mouth rinse  15 mL Mouth Rinse Q2H   pantoprazole (PROTONIX) IV  40 mg Intravenous Q6H   Followed by   [START ON 06/09/2023] pantoprazole (PROTONIX) IV  40 mg Intravenous Q12H   Continuous Infusions:  cefTRIAXone (ROCEPHIN)  IV     dextrose 50 mL/hr at 06/08/23 0900   norepinephrine (LEVOPHED) Adult infusion 3 mcg/min (06/08/23 0900)   PRN Meds: dextrose, fentaNYL (SUBLIMAZE) injection, mouth rinse   Vital Signs    Vitals:   06/08/23 0830 06/08/23 0845 06/08/23 0900 06/08/23 0915  BP:      Pulse: 79 83 69 68  Resp: 11 (!) 9 (!) 24 (!) 24  Temp:      TempSrc:      SpO2: 100% 100% 100% 100%  Weight:      Height:        Intake/Output Summary (Last 24 hours) at 06/08/2023 0952 Last data filed at 06/08/2023 0900 Gross per 24 hour  Intake 1711.43 ml  Output 1550 ml  Net 161.43 ml      06/08/2023    3:49 AM 06/07/2023    5:00 AM 06/06/2023    5:00 AM  Last 3 Weights  Weight (lbs) 114 lb 10.2 oz 113 lb 8.6 oz 106 lb 14.8 oz  Weight (kg) 52 kg 51.5 kg 48.5 kg      Telemetry    Sinus rhythm.  PACs, junctional rhythm- Personally Reviewed  ECG    Ectopic atrial rhythm with PACs, low voltage, nonspecific T wave abnormalities. - Personally Reviewed  Physical Exam   VS:  BP (!) 119/54   Pulse 68   Temp (!) 96.3 F (35.7 C) (Axillary)   Resp (!) 24   Ht 5\' 9"  (1.753 m)   Wt 52 kg   SpO2 100%   BMI 16.93 kg/m  , BMI Body mass index is 16.93 kg/m. GENERAL: Critically ill-appearing.  Intubated and sedated HEENT: Pupils equal round  and reactive, fundi not visualized, oral mucosa unremarkable NECK:  No jugular venous distention, waveform within normal limits, carotid upstroke brisk and symmetric, no bruits, no thyromegaly LUNGS:  Clear to auscultation bilaterally HEART:  RRR.  PMI not displaced or sustained,S1 and S2 within normal limits, no S3, no S4, no clicks, no rubs, no murmurs ABD:  Flat, positive bowel sounds normal in frequency in pitch, no bruits, no rebound, no guarding, no midline pulsatile mass, no hepatomegaly, no splenomegaly EXT: Bilateral AKA SKIN:  No rashes no nodules NEURO:  Unable to assess.  Intubated and sedated. PSYCH: Unable to assess.  Intubated and sedated.   Labs    High Sensitivity Troponin:   Recent Labs  Lab 06/07/23 2126 06/07/23 2323 06/08/23 0152 06/08/23 0345 06/08/23 0522  TROPONINIHS 209* 253* 284* 298* 293*     Chemistry Recent Labs  Lab 06/05/23 2330 06/06/23 0500 06/06/23 1332 06/06/23 1930 06/07/23 0500 06/07/23 1011 06/08/23 0152  NA 123* 127*   < > 123* 126* 129* 128*  K 4.0 4.5   < > 4.8 4.8 4.1  3.8  CL 96* 100   < > 97* 101  --  100  CO2 18* 15*   < > 14* 16*  --  16*  GLUCOSE 510* 330*   < > 348* 192*  --  141*  BUN 26* 29*   < > 27* 31*  --  28*  CREATININE 2.17* 2.02*   < > 1.80* 2.21*  --  1.72*  CALCIUM 6.6* 6.9*   < > 6.9* 7.3*  --  7.4*  MG 1.0* 1.1*  --  3.1* 2.8*  --   --   PROT 4.5*  --   --   --   --   --   --   ALBUMIN <1.5*  --   --   --   --   --   --   AST 100*  --   --   --   --   --   --   ALT 30  --   --   --   --   --   --   ALKPHOS 81  --   --   --   --   --   --   BILITOT 1.2*  --   --   --   --   --   --   GFRNONAA 31* 34*   < > 39* 31*  --  41*  ANIONGAP 9 12   < > 12 9  --  12   < > = values in this interval not displayed.    Lipids  Recent Labs  Lab 06/06/23 0500  TRIG 43    Hematology Recent Labs  Lab 06/07/23 0500 06/07/23 1011 06/07/23 1654 06/08/23 0345  WBC 29.8*  --  32.1* 31.1*  RBC 2.60*  --  2.46* 2.45*   HGB 8.2* 7.5* 7.9* 7.7*  HCT 23.4* 22.0* 21.8* 21.7*  MCV 90.0  --  88.6 88.6  MCH 31.5  --  32.1 31.4  MCHC 35.0  --  36.2* 35.5  RDW 14.6  --  14.6 14.6  PLT 97*  --  85* 76*   Thyroid No results for input(s): "TSH", "FREET4" in the last 168 hours.  BNPNo results for input(s): "BNP", "PROBNP" in the last 168 hours.  DDimer No results for input(s): "DDIMER" in the last 168 hours.   Radiology    ECHOCARDIOGRAM COMPLETE Result Date: 06/07/2023    ECHOCARDIOGRAM REPORT   Patient Name:   Jeff Ferrell Date of Exam: 06/07/2023 Medical Rec #:  [id number]       Height:       69.0 in Accession #:    [id number]      Weight:       113.5 lb Date of Birth:  1951/06/07        BSA:          1.624 m Patient Age:    72 years        BP:           127/62 mmHg Patient Gender: M               HR:           81 bpm. Exam Location:  Inpatient Procedure: 2D Echo, Color Doppler, Cardiac Doppler and Intracardiac            Opacification Agent Indications:    R94.31 Abnormal EKG  History:        Patient has prior history of Echocardiogram examinations,  most                 recent 08/13/2015. Risk Factors:Diabetes and ETOH.  Sonographer:    Irving Burton Senior RDCS Referring Phys: [phone] STEPHANIE M REESE IMPRESSIONS  1. Endocardial borders are challenging to assess. Hypokinesis of the anterior/anteroseptal/inferoseptal/inferior wall from base to apex. Left ventricular ejection fraction, by estimation, is 30 to 35%. The left ventricle has moderately decreased function. The left ventricle demonstrates global hypokinesis. Left ventricular diastolic parameters are consistent with Grade II diastolic dysfunction (pseudonormalization).  2. Right ventricular systolic function was not well visualized. The right ventricular size is normal.  3. The mitral valve is normal in structure. No evidence of mitral valve regurgitation.  4. The aortic valve is grossly normal. Aortic valve regurgitation is not visualized.  5. The inferior vena cava is  dilated in size with <50% respiratory variability, suggesting right atrial pressure of 15 mmHg. Conclusion(s)/Recommendation(s): Echo is suggestive of prior infarct. FINDINGS  Left Ventricle: Endocardial borders are challenging to assess. Hypokinesis of the anterior/anteroseptal/inferoseptal/inferior wall from base to apex. Left ventricular ejection fraction, by estimation, is 30 to 35%. The left ventricle has moderately decreased function. The left ventricle demonstrates global hypokinesis. Definity contrast agent was given IV to delineate the left ventricular endocardial borders. The left ventricular internal cavity size was normal in size. There is no left ventricular  hypertrophy. Left ventricular diastolic parameters are consistent with Grade II diastolic dysfunction (pseudonormalization). Right Ventricle: The right ventricular size is normal. Right ventricular systolic function was not well visualized. Left Atrium: Left atrial size was normal in size. Right Atrium: Right atrial size was normal in size. Pericardium: There is no evidence of pericardial effusion. Mitral Valve: The mitral valve is normal in structure. No evidence of mitral valve regurgitation. Tricuspid Valve: Tricuspid valve regurgitation is not demonstrated. Aortic Valve: The aortic valve is grossly normal. Aortic valve regurgitation is not visualized. Pulmonic Valve: Pulmonic valve regurgitation is not visualized. Aorta: The aortic root and ascending aorta are structurally normal, with no evidence of dilitation. Venous: The inferior vena cava is dilated in size with less than 50% respiratory variability, suggesting right atrial pressure of 15 mmHg. IAS/Shunts: The interatrial septum was not well visualized.  LEFT VENTRICLE PLAX 2D LVIDd:         3.90 cm LVIDs:         3.30 cm LV PW:         0.90 cm LV IVS:        0.80 cm LVOT diam:     1.80 cm LV SV:         14 LV SV Index:   9 LVOT Area:     2.54 cm  LV Volumes (MOD) LV vol d, MOD A4C: 67.5  ml LV vol s, MOD A4C: 56.8 ml LV SV MOD A4C:     67.5 ml RIGHT VENTRICLE RV S prime:     8.05 cm/s LEFT ATRIUM             Index        RIGHT ATRIUM           Index LA diam:        2.80 cm 1.72 cm/m   RA Area:     11.30 cm LA Vol (A2C):   27.4 ml 16.87 ml/m  RA Volume:   24.00 ml  14.78 ml/m LA Vol (A4C):   46.1 ml 28.38 ml/m LA Biplane Vol: 36.0 ml 22.17 ml/m  AORTIC VALVE LVOT  Vmax:   40.75 cm/s LVOT Vmean:  28.750 cm/s LVOT VTI:    0.057 m  AORTA Ao Root diam: 2.90 cm Ao Asc diam:  3.30 cm  SHUNTS Systemic VTI:  0.06 m Systemic Diam: 1.80 cm Carolan Clines Electronically signed by Carolan Clines Signature Date/Time: 06/07/2023/3:43:24 PM    Final    DG CHEST PORT 1 VIEW Result Date: 06/07/2023 CLINICAL DATA:  Pleural effusion. EXAM: PORTABLE CHEST 1 VIEW COMPARISON:  06/05/2023 FINDINGS: Endotracheal tube tip is 6 cm above the base of the carina. There is progressive atelectasis or infiltrate in both lung bases. Small left and tiny right pleural effusions again noted. Telemetry leads overlie the chest. IMPRESSION: 1. Progressive atelectasis or infiltrate in both lung bases. 2. Small left and tiny right pleural effusions. Electronically Signed   By: Kennith Center M.D.   On: 06/07/2023 09:54   CT HEAD WO CONTRAST ( ) Result Date: 06/06/2023 CLINICAL DATA:  Anisocoria, pupillary change EXAM: CT HEAD WITHOUT CONTRAST TECHNIQUE: Contiguous axial images were obtained from the base of the skull through the vertex without intravenous contrast. RADIATION DOSE REDUCTION: This exam was performed according to the departmental dose-optimization program which includes automated exposure control, adjustment of the mA and/or kV according to patient size and/or use of iterative reconstruction technique. COMPARISON:  08/24/2015 FINDINGS: Brain: No evidence of acute infarction, hemorrhage, mass, mass effect, or midline shift. No hydrocephalus or extra-axial fluid collection. Age-related cerebral volume loss. 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 Vascular: No hyperdense vessel. Skull: Negative for fracture or focal lesion. Sinuses/Orbits: Mucosal thickening in the maxillary sinuses and ethmoid air cells. Status post bilateral lens replacements. Other: The mastoid air cells are well aerated. IMPRESSION: No acute intracranial process. Electronically Signed   By: Wiliam Ke M.D.   On: 06/06/2023 12:39    Cardiac Studies   Echo 06/07/23: 1. Endocardial borders are challenging to assess. Hypokinesis of the  anterior/anteroseptal/inferoseptal/inferior wall from base to apex. Left  ventricular ejection fraction, by estimation, is 30 to 35%. The left  ventricle has moderately decreased  function. The left ventricle demonstrates global hypokinesis. Left  ventricular diastolic parameters are consistent with Grade II diastolic  dysfunction (pseudonormalization).   2. Right ventricular systolic function was not well visualized. The right  ventricular size is normal.   3. The mitral valve is normal in structure. No evidence of mitral valve  regurgitation.   4. The aortic valve is grossly normal. Aortic valve regurgitation is not  visualized.   5. The inferior vena cava is dilated in size with <50% respiratory  variability, suggesting right atrial pressure of 15 mmHg.   Patient Profile     Jeff Ferrell is a 72 y.o. male with a hx of diabetes, PVD, osteomyelitis s/p BKA, adrenal insufficiency, EtOH abuse, prior DVT, discitis and multilevel laminectomy here after being found down.  He was subsequently found to have GI bleed, sepsis from a urinary source and newly diagnosed systolic heart failure.   Assessment & Plan    # Acute systolic and diastolic HF:  Echo reveals newly reduced systolic function different from when he last had an echo here in 2017.  There is also focal wall motion abnormality.  Pending stabilization, would recommend an ischemic evaluation.  Of course this cannot happen until we understand  his underlying mental status and improvement in his renal function and anemia.  Will add guideline directed medical therapy as able.  He remains on Levophed.  Given the focal wall motion abnormalities  will check hs-troponin.  EKG revealed arrhythmias and low voltage.  There was a suspicion for infiltrative cardiomyopathies.  Septal thickness is within normal limits on echo making this less likely.   # Mixed septic/hemorrhagic/cardiogenic shock: Patient remains on pressors as above.  Multiple reasons to be hypotensive including GI bleed, infection, and newly reduced EF, though it is unclear how long his EF has been reduced.  Management per ICU team.   # Ventricular bigeminy: # Ventricular trigeminy:  Suspect that this will continue to correct with treatment of his underlying metabolic derangements, acute blood loss anemia and infection.  Will consider evaluation for ischemia once more medically stable.  Would favor adding a beta blocker when BP permits.        For questions or updates, please contact [facility] Please consult [email or web address] for contact info under        Signed, Chilton Si, MD  06/08/2023, 9:52 AM

## 2023-06-08 NOTE — TOC CM/SW Note (Addendum)
Transition of Care [facility]) - Inpatient Brief Assessment   Patient Details  Name: Jeff Ferrell MRN: [id number] Date of Birth: 16-Jan-1951  Transition of Care [facility]) CM/SW Contact:    Marliss Coots, LCSW Phone Number: 06/08/2023, 11:17 AM   Clinical Narrative:  11:18 AM CSW attempted to call Chelsea Primus of [facility] and Lead EMT Rusty of [facility] regarding patient's chart restrictions. There were no responses and voicemails were left.  1:27 PM [facility] ED returned CSW's call. CSW provided contact information and patient name to ED, who stated that they would inform Dr. Clinton Sawyer to call CSW at earliest convenience.  Transition of Care Asessment: Insurance and Status: Insurance coverage has been reviewed Patient has primary care physician: Yes     Prior/Current Home Services: No current home services Social Drivers of Health Review:  (Unable to assess) Readmission risk has been reviewed: Yes Transition of care needs: transition of care needs identified, TOC will continue to follow

## 2023-06-08 NOTE — Progress Notes (Addendum)
Nutrition Follow-up  DOCUMENTATION CODES:  Severe malnutrition in context of social or environmental circumstances  INTERVENTION:  Once cortrak in place, recommend the following: Osmolite 1.2 at 60 ml/h (1440 ml per day) Start at 20 and advance by 10mL q12h to goal Provides 1728 kcal, 80 gm protein, 1181 ml free water daily Recommend -1 packet Juven BID, each packet provides 95 calories, 2.5 grams of protein (collagen) Monitor magnesium and phosphorus every 12 hours x 4 occurrences, MD to replete as needed, as pt is at risk for refeeding syndrome given severe malnutrition. Thiamine 100mg  x 5 days  NUTRITION DIAGNOSIS:  Severe Malnutrition related to social / environmental circumstances (inadequate energy intake) as evidenced by severe fat depletion, severe muscle depletion. - remains applicable  GOAL:  Patient will meet greater than or equal to 90% of their needs - progressing, cortrak to be placed  MONITOR:  I & O's, Vent status, Labs, Weight trends  REASON FOR ASSESSMENT:  Ventilator    ASSESSMENT:  Pt with hx of DM type 2, PUD, hx of EtOH, colostomy, and hx pf bilateral AKA presented from outside hospital with shock, respiratory failure, and acute renal failure. Noted that pt was reportedly found down at home 12/17 with dark blood coming from ostomy. Intubated prior to transfer to [facility].   12/17 - admitted to [facility] from outside hospital, intubated 12/18 - EGD, Large, oozing gastric ulcer with adherent clot  12/20 - cortrak pending  Pt remains intubated on vent support. No family in room at the time of visit. OGT pulled during EGD. Cortrak to be placed today and MD requests enteral feeds be initiated. Pt having hypoglycemia without IVF support. Pt will be at risk for refeeding when nutrition is initiated.   Discussed in rounds, RN reports family to be visiting this afternoon.   MV: 5.5 L/min Temp (24hrs), Avg:97.4 F (36.3 C), Min:96.3 F (35.7 C), Max:97.9 F  (36.6 C) MAP (Art Line): 72 mmHg  Admit weight: 48.5 kg Current weight: 52 kg   Intake/Output Summary (Last 24 hours) at 06/08/2023 1019 Last data filed at 06/08/2023 0900 Gross per 24 hour  Intake 1630.02 ml  Output 1550 ml  Net 80.02 ml  Net IO Since Admission: 3,750.94 mL [06/08/23 1019]  Drains/Lines: CVC Triple Lumen, left femoral Colostomy RUQ Cortrak  Nutritionally Relevant Medications: Scheduled Meds:  docusate  100 mg Per Tube BID   insulin aspart  0-9 Units Subcutaneous Q4H   pantoprazole (PROTONIX) IV  40 mg Intravenous Q6H   Continuous Infusions:  cefTRIAXone (ROCEPHIN)  IV Stopped (06/08/23 1040)   norepinephrine (LEVOPHED) Adult infusion 3 mcg/min (06/08/23 1100)   Labs Reviewed: Na 128 BUN 28, creatinine 1.72 CBG ranges from 45-224 mg/dL over the last 24 hours HgbA1c 4.9%  NUTRITION - FOCUSED PHYSICAL EXAM: Flowsheet Row Most Recent Value  Orbital Region Severe depletion  Upper Arm Region Moderate depletion  Thoracic and Lumbar Region Moderate depletion  Buccal Region Severe depletion  Temple Region Severe depletion  Clavicle Bone Region Mild depletion  Clavicle and Acromion Bone Region Mild depletion  Scapular Bone Region Moderate depletion  Dorsal Hand Severe depletion  Patellar Region Unable to assess  Anterior Thigh Region Unable to assess  Posterior Calf Region Unable to assess  Edema (RD Assessment) Mild  [hands, forearms]  Hair Reviewed  Eyes Reviewed  Mouth Reviewed  Skin Reviewed  Nails Reviewed    Diet Order:   Diet Order  Diet NPO time specified  Diet effective now                  EDUCATION NEEDS:  Not appropriate for education at this time  Skin:  Skin Assessment: Reviewed RN Assessment Stage 3: - Sacrum (2x3 cm) - Back, left upper (3x3 cm) - Back, right upper (2x2 cm) - vertebral column (5x1 cm)  Last BM:  12/19  Height:  Ht Readings from Last 1 Encounters:  06/05/23 5\' 9"  (1.753 m)     Weight:  Wt Readings from Last 1 Encounters:  06/08/23 52 kg    Ideal Body Weight:  58.2 kg (adjusted by 20% for bilateral AKA)  BMI:  Adjusted Body mass index is 19.7 kg/m. (Using 12/17 wt and adjusted by 20%)  Estimated Nutritional Needs:  Kcal:  1600-1900 kcal/d Protein:  75-90g/d Fluid:  1.8L/d    Greig Castilla, RD, LDN Registered Dietitian II Please reach out via secure chat Weekend on-call pager # available in [facility]

## 2023-06-08 NOTE — Plan of Care (Signed)
  Problem: Nutrition: Goal: Adequate nutrition will be maintained Outcome: Progressing   Problem: Education: Goal: Knowledge of General Education information will improve Description: Including pain rating scale, medication(s)/side effects and non-pharmacologic comfort measures Outcome: Not Progressing   Problem: Health Behavior/Discharge Planning: Goal: Ability to manage health-related needs will improve Outcome: Not Progressing   Problem: Clinical Measurements: Goal: Will remain free from infection Outcome: Not Progressing Goal: Diagnostic test results will improve Outcome: Not Progressing Goal: Respiratory complications will improve Outcome: Not Progressing Goal: Cardiovascular complication will be avoided Outcome: Not Progressing   Problem: Pain Management: Goal: General experience of comfort will improve Outcome: Not Progressing

## 2023-06-08 NOTE — Progress Notes (Signed)
eLink Physician-Brief Progress Note Patient Name: Jeff Ferrell DOB: March 18, 1951 MRN: [id number]   Date of Service  06/08/2023  HPI/Events of Note  Patient more alert and agitated off continuous sedation. Bedside RN reports attempts to wean from vent today however failed due to apnea.   eICU Interventions  Start precedex Monitor tele with known arrhythmias Restraints ordered to prevent pulling of lines and ETT     Intervention Category Minor Interventions: Agitation / anxiety - evaluation and management  Jeff Ferrell 06/08/2023, 9:35 PM

## 2023-06-09 ENCOUNTER — Inpatient Hospital Stay (HOSPITAL_COMMUNITY): Payer: No Typology Code available for payment source

## 2023-06-09 DIAGNOSIS — I5041 Acute combined systolic (congestive) and diastolic (congestive) heart failure: Secondary | ICD-10-CM | POA: Diagnosis not present

## 2023-06-09 DIAGNOSIS — R579 Shock, unspecified: Secondary | ICD-10-CM | POA: Diagnosis not present

## 2023-06-09 DIAGNOSIS — N179 Acute kidney failure, unspecified: Secondary | ICD-10-CM | POA: Diagnosis not present

## 2023-06-09 DIAGNOSIS — J9601 Acute respiratory failure with hypoxia: Secondary | ICD-10-CM | POA: Diagnosis not present

## 2023-06-09 LAB — BASIC METABOLIC PANEL
Anion gap: 5 (ref 5–15)
BUN: 27 mg/dL — ABNORMAL HIGH (ref 8–23)
CO2: 18 mmol/L — ABNORMAL LOW (ref 22–32)
Calcium: 7.2 mg/dL — ABNORMAL LOW (ref 8.9–10.3)
Chloride: 105 mmol/L (ref 98–111)
Creatinine, Ser: 1.35 mg/dL — ABNORMAL HIGH (ref 0.61–1.24)
GFR, Estimated: 55 mL/min — ABNORMAL LOW (ref >60)
Glucose, Bld: 232 mg/dL — ABNORMAL HIGH (ref 70–99)
Potassium: 2.9 mmol/L — ABNORMAL LOW (ref 3.5–5.1)
Sodium: 128 mmol/L — ABNORMAL LOW (ref 135–145)

## 2023-06-09 LAB — PHOSPHORUS
Phosphorus: 1.2 mg/dL — ABNORMAL LOW (ref 2.5–4.6)
Phosphorus: 3.2 mg/dL (ref 2.5–4.6)

## 2023-06-09 LAB — CBC
HCT: 21.5 % — ABNORMAL LOW (ref 39.0–52.0)
Hemoglobin: 7.5 g/dL — ABNORMAL LOW (ref 13.0–17.0)
MCH: 31.8 pg (ref 26.0–34.0)
MCHC: 34.9 g/dL (ref 30.0–36.0)
MCV: 91.1 fL (ref 80.0–100.0)
Platelets: 80 10*3/uL — ABNORMAL LOW (ref 150–400)
RBC: 2.36 MIL/uL — ABNORMAL LOW (ref 4.22–5.81)
RDW: 15 % (ref 11.5–15.5)
WBC: 24.3 10*3/uL — ABNORMAL HIGH (ref 4.0–10.5)
nRBC: 0.7 % — ABNORMAL HIGH (ref 0.0–0.2)

## 2023-06-09 LAB — MAGNESIUM
Magnesium: 2.1 mg/dL (ref 1.7–2.4)
Magnesium: 1.9 mg/dL (ref 1.7–2.4)

## 2023-06-09 LAB — URINE CULTURE: Culture: >=100000 — AB

## 2023-06-09 LAB — GLUCOSE, CAPILLARY
Glucose-Capillary: 206 mg/dL — ABNORMAL HIGH (ref 70–99)
Glucose-Capillary: 304 mg/dL — ABNORMAL HIGH (ref 70–99)
Glucose-Capillary: 356 mg/dL — ABNORMAL HIGH (ref 70–99)
Glucose-Capillary: 310 mg/dL — ABNORMAL HIGH (ref 70–99)
Glucose-Capillary: 229 mg/dL — ABNORMAL HIGH (ref 70–99)
Glucose-Capillary: 156 mg/dL — ABNORMAL HIGH (ref 70–99)
Glucose-Capillary: 139 mg/dL — ABNORMAL HIGH (ref 70–99)

## 2023-06-09 MED ORDER — POTASSIUM PHOSPHATES 15 MMOLE/5ML IV SOLN
45.0000 mmol | Freq: Once | INTRAVENOUS | Status: AC
  Administered 2023-06-09: 45 mmol via INTRAVENOUS
  Filled 2023-06-09: qty 15

## 2023-06-09 MED ORDER — POTASSIUM CHLORIDE 20 MEQ PO PACK
40.0000 meq | PACK | Freq: Once | ORAL | Status: AC
  Administered 2023-06-09: 40 meq
  Filled 2023-06-09: qty 2

## 2023-06-09 MED ORDER — POTASSIUM CHLORIDE 20 MEQ PO PACK
20.0000 meq | PACK | Freq: Once | ORAL | Status: AC
  Administered 2023-06-09: 20 meq
  Filled 2023-06-09: qty 1

## 2023-06-09 MED ORDER — SODIUM CHLORIDE 0.9 % IV SOLN
1.0000 g | Freq: Two times a day (BID) | INTRAVENOUS | Status: DC
  Administered 2023-06-09 – 2023-06-12 (×8): 1 g via INTRAVENOUS
  Filled 2023-06-09 (×8): qty 20

## 2023-06-09 MED ORDER — INSULIN DETEMIR 100 UNIT/ML ~~LOC~~ SOLN
5.0000 [IU] | Freq: Every day | SUBCUTANEOUS | Status: DC
  Administered 2023-06-09 – 2023-06-12 (×4): 5 [IU] via SUBCUTANEOUS
  Filled 2023-06-09 (×5): qty 0.05

## 2023-06-09 MED ORDER — INSULIN ASPART 100 UNIT/ML IJ SOLN
0.0000 [IU] | INTRAMUSCULAR | Status: DC
  Administered 2023-06-09: 4 [IU] via SUBCUTANEOUS
  Administered 2023-06-09: 3 [IU] via SUBCUTANEOUS
  Administered 2023-06-09: 20 [IU] via SUBCUTANEOUS
  Administered 2023-06-09 – 2023-06-10 (×3): 7 [IU] via SUBCUTANEOUS
  Administered 2023-06-10: 11 [IU] via SUBCUTANEOUS
  Administered 2023-06-10 – 2023-06-11 (×2): 4 [IU] via SUBCUTANEOUS

## 2023-06-09 NOTE — Progress Notes (Addendum)
NAME:  Jeff Ferrell, MRN:  [id number], DOB:  1951/03/01, LOS: 4 ADMISSION DATE:  06/05/2023, CONSULTATION DATE:  06/05/23 REFERRING MD:  Malva Cogan transfer, CHIEF COMPLAINT:  shock, acute respiratory failure   History of present illness   72 year old man who presented to [facility] as a transfer from [facility] for shock, acute respiratory failure and acute renal failure. PMHx significant for adrenal insufficiency, TIA, DVT, IDA, PUD, T2DM (c/b diabetic peripheral neuropathy/foot wounds and /;osteomyelitis requiring bilateral AKAs), low back pain 2/2 discitis s/p multilevel laminectomy, BPH.   Patient is intubated/sedated, therefore history is obtained from chart review of records from [facility]. Patient was reportedly found down at home ~0900 12/17 with dark blood coming from ostomy. Glucose borderline low at 67 on EMS arrival and patient was noted to be cold, bradycardic and stiff. Required bagging with EMS due to insufficient respiratory effort. Ultimately required intubation.R femoral central line placed. At [facility], labs were notable for WBC 35, Hgb 8.4. Na 123, CO2 6.5, BUN 31/Cr 2.88. AST 60. Ammonia 86. AG 17. Vanc/Zosyn were initiated as well as bicarbonate gtt. Levophed started for vasopressor support.   Transferred to [facility] for higher level of care.  Past Medical History  Adrenal insufficiency Diabetic OM s/p AKA bilateral IDA, PUD BPH Alcohol use disorder  Significant Hospital Events   12/17 - Presented to [facility] as a transfer from [facility] for shock, ARF. Intubated, on pressors. PCCM consulted for admission.  12/18- bedside EGD with large oozing ulcer, no varices. Hemostatic gel placed and ulcer base biopsied. CT head negative 12/19- echo with EF 30-35%, apical hypokinesis 12/20 failed SBT for apnea  Interim history/subjective:  On precedex started overnight for agitation. On and off vasopressors.   Objective   Blood pressure (!)  119/52, pulse 67, temperature (!) 96.9 F (36.1 C), temperature source Axillary, resp. rate (!) 24, height 5\' 9"  (1.753 m), weight 51.4 kg, SpO2 100%.    Vent Mode: PRVC FiO2 (%):  [30 %] 30 % Set Rate:  [24 bmp] 24 bmp Vt Set:  [500 mL] 500 mL PEEP:  [5 cmH20] 5 cmH20 Plateau Pressure:  [20 cmH20-27 cmH20] 22 cmH20   Intake/Output Summary (Last 24 hours) at 06/09/2023 0919 Last data filed at 06/09/2023 0800 Gross per 24 hour  Intake 1272.73 ml  Output 1850 ml  Net -577.27 ml   Filed Weights   06/07/23 0500 06/08/23 0349 06/09/23 0500  Weight: 51.5 kg 52 kg 51.4 kg    Examination: Gen:      Intubated, sedated, acutely ill appearing HEENT:  ETT to vent Lungs:    sounds of mechanical ventilation auscultated no wheeze CV:         RRR no mrg Abd:      + bowel sounds; soft, non-tender; no palpable masses, no distension Ext:    Bilateral AKA Skin:      Warm and dry; no rashes Neuro:   sedated, RASS -4   Labs and imaging reviewed: Na 128 K 2.9 Cr 1.35 Cr 1.35 Phos 1.2   Resolved Hospital Problem list   AGMA resolved Septic shock resolved.   Assessment & Plan:  72 y.o. year old male with PUD, reported alcohol abuse, adrenal insufficiency  Multifactorial shock - mixed septic, hemorrhagic, cardiogenic Adrenal insufficiency  -Trend CBC, f/u surgical pathology -levophed with MAP goal >65 -IV PPI BID -continue stress dose steroids with hydrocortisone 100 mg q 8 hrs  ABLA secondary to UGI Bleed Gastric ulceration  Large gastric ulcer with visible vessel seen on EGD 12/18, ulcer concerning for malignancy and biopsy was taken.  If rebleeds GI would recommend upper endoscopy versus consulting IR for embolization. -Continue pantoprazole 40 mg IV every 6 hours -Follow-up pathology results - daily H&H  Ecoli UTI - initially treated with ceftriaxone, cultures now positive for ESBL organisim, will broaden to merrem  Acute HFrEF Echo with EF of 30 to 35%.  Ventricular  bigeminy -Appreciate cardiology recommendations, will need ischemic evaluation once recovers from acute illness -Remains on Levophed -Consider adding beta-blocker and additional GDMT once able -Ischemic evaluation pending stabilization  Acute hypoxemic respiratory failure  Small left pleural effusion Intubated at [facility] ED 12/17. He does have bilateral pleural effusions L>R. For now will monitor with serial CXR, cxr 12/21 ordered - Continue full vent support (4-8cc/kg IBW) - Wean FiO2 for O2 sat > 90% - Daily WUA/SBT, will hold sedation and attempt SAT/SBT today - VAP bundle - Pulmonary hygiene - PAD protocol for sedation: Propofol and Fentanyl for goal RASS 0 to -1 - CXR in 12/21 AM   Non oliguric AKI/ARF in the setting of sepsis/ATN NAGMA Hypokalemia Hypophosphatemia - daily bmet - replete electrolytes - monitor UOP - may benefit from diuresis  Anisocoria Hx of TIA - head ct 12/18 unremarkable.  T2DM Hyperglycemia CBG 232. Will increase SSI for goal CBG 140-180. Adding long acting back - likely exacerbated by steroids, may need to stop long acting once off steroids.   Thrombocytopenia, reactive Platelets of 92 to 67 Platelets within normal limits at baseline. CT abd from outside hospital did mention steatosis. Non know history of cirrhosis. He does have significant alcohol use history. Low platelets in setting of likely acute infection.  -trend CBC  Peripheral neuropathy s/p bilateral AKA Multiple skin wounds including sacral decubitus ulcer, POA -Wound care consult  GOC - Appreciate palliative assistance.family meeting 12/20 with palliative care.   Best practice:  Diet: NPO, tube feeds Pain/Anxiety/Delirium protocol (if indicated): Fentanyl, precedex DVT prophylaxis: none GI prophylaxis: PPI Glucose control: SSI, sensitive Code Status: full code Family Communication: son updated 06/08/23. Full code for now.   The patient is critically ill due to  respiratory failure, shock.  Critical care was necessary to treat or prevent imminent or life-threatening deterioration.  Critical care was time spent personally by me on the following activities: development of treatment plan with patient and/or surrogate as well as nursing, discussions with consultants, evaluation of patient's response to treatment, examination of patient, obtaining history from patient or surrogate, ordering and performing treatments and interventions, ordering and review of laboratory studies, ordering and review of radiographic studies, pulse oximetry, re-evaluation of patient's condition and participation in multidisciplinary rounds.   Critical Care Time devoted to patient care services described in this note is 37 minutes. This time reflects time of care of this signee Charlott Holler . This critical care time does not reflect separately billable procedures or procedure time, teaching time or supervisory time of PA/NP/Med student/Med Resident etc but could involve care discussion time.       Charlott Holler [facility] 06/09/2023 9:19 AM  Pager: see AMION  If no response to pager , please call critical care on call (see AMION) until 7pm After 7:00 pm call Elink

## 2023-06-09 NOTE — Progress Notes (Signed)
Rounding Note    Patient Name: Jeff Ferrell Date of Encounter: 06/09/2023  [facility] Cardiologist: None   Subjective   Renal function improving (creatinine 2.2 > 1.7 > 1.35).  Hemoglobin 7.5.  On levophed at 2 mcg/min.  Remains intubated and sedated.  FiO2 30%, PEEP 5.   Inpatient Medications    Scheduled Meds:  Chlorhexidine Gluconate Cloth  6 each Topical Daily   docusate  100 mg Per Tube BID   hydrocortisone sod succinate (SOLU-CORTEF) inj  100 mg Intravenous Q8H   insulin aspart  0-20 Units Subcutaneous Q4H   insulin detemir  5 Units Subcutaneous Daily   nutrition supplement (JUVEN)  1 packet Per Tube BID BM   mouth rinse  15 mL Mouth Rinse Q2H   pantoprazole (PROTONIX) IV  40 mg Intravenous Q6H   Followed by   pantoprazole (PROTONIX) IV  40 mg Intravenous Q12H   thiamine  100 mg Per Tube Daily   Continuous Infusions:  dexmedetomidine (PRECEDEX) IV infusion 0.6 mcg/kg/hr (06/09/23 1200)   feeding supplement (OSMOLITE 1.2 CAL) 30 mL/hr at 06/09/23 1200   meropenem (MERREM) IV 200 mL/hr at 06/09/23 1200   norepinephrine (LEVOPHED) Adult infusion 2 mcg/min (06/09/23 1200)   PRN Meds: dextrose, fentaNYL (SUBLIMAZE) injection, mouth rinse   Vital Signs    Vitals:   06/09/23 1121 06/09/23 1130 06/09/23 1145 06/09/23 1200  BP:      Pulse:  76 70 76  Resp:  (!) 24 (!) 24 (!) 24  Temp: 98.6 F (37 C)     TempSrc: Axillary     SpO2:  98% 100% 100%  Weight:      Height:        Intake/Output Summary (Last 24 hours) at 06/09/2023 1313 Last data filed at 06/09/2023 1200 Gross per 24 hour  Intake 1538.9 ml  Output 2250 ml  Net -711.1 ml      06/09/2023    5:00 AM 06/08/2023    3:49 AM 06/07/2023    5:00 AM  Last 3 Weights  Weight (lbs) 113 lb 5.1 oz 114 lb 10.2 oz 113 lb 8.6 oz  Weight (kg) 51.4 kg 52 kg 51.5 kg      Telemetry    Sinus rhythm.  - Personally Reviewed  ECG    No new ECG - Personally Reviewed  Physical Exam   VS:  BP  (!) 119/52   Pulse 76   Temp 98.6 F (37 C) (Axillary)   Resp (!) 24   Ht 5\' 9"  (1.753 m)   Wt 51.4 kg   SpO2 100%   BMI 16.73 kg/m  , BMI Body mass index is 16.73 kg/m. GENERAL: Critically ill-appearing.  Intubated and sedated NECK:  No jugular venous distention LUNGS:  Mechanical breath sounds HEART:  RRR.  no murmurs ABD: Nondistended EXT: Bilateral AKA SKIN:  No rashes NEURO:  Unable to assess.  Intubated and sedated. PSYCH: Unable to assess.  Intubated and sedated.   Labs    High Sensitivity Troponin:   Recent Labs  Lab 06/07/23 2323 06/08/23 0152 06/08/23 0345 06/08/23 0522 06/08/23 0732  TROPONINIHS 253* 284* 298* 293* 296*     Chemistry Recent Labs  Lab 06/05/23 2330 06/06/23 0500 06/07/23 0500 06/07/23 1011 06/08/23 0152 06/08/23 0704 06/08/23 1742 06/09/23 0321  NA 123*   < > 126* 129* 128*  --   --  128*  K 4.0   < > 4.8 4.1 3.8  --   --  2.9*  CL 96*   < > 101  --  100  --   --  105  CO2 18*   < > 16*  --  16*  --   --  18*  GLUCOSE 510*   < > 192*  --  141*  --   --  232*  BUN 26*   < > 31*  --  28*  --   --  27*  CREATININE 2.17*   < > 2.21*  --  1.72*  --   --  1.35*  CALCIUM 6.6*   < > 7.3*  --  7.4*  --   --  7.2*  MG 1.0*   < > 2.8*  --   --  2.2 2.2 2.1  PROT 4.5*  --   --   --   --   --   --   --   ALBUMIN <1.5*  --   --   --   --   --   --   --   AST 100*  --   --   --   --   --   --   --   ALT 30  --   --   --   --   --   --   --   ALKPHOS 81  --   --   --   --   --   --   --   BILITOT 1.2*  --   --   --   --   --   --   --   GFRNONAA 31*   < > 31*  --  41*  --   --  55*  ANIONGAP 9   < > 9  --  12  --   --  5   < > = values in this interval not displayed.    Lipids  Recent Labs  Lab 06/06/23 0500  TRIG 43    Hematology Recent Labs  Lab 06/07/23 1654 06/08/23 0345 06/09/23 0321  WBC 32.1* 31.1* 24.3*  RBC 2.46* 2.45* 2.36*  HGB 7.9* 7.7* 7.5*  HCT 21.8* 21.7* 21.5*  MCV 88.6 88.6 91.1  MCH 32.1 31.4 31.8  MCHC  36.2* 35.5 34.9  RDW 14.6 14.6 15.0  PLT 85* 76* 80*   Thyroid No results for input(s): "TSH", "FREET4" in the last 168 hours.  BNPNo results for input(s): "BNP", "PROBNP" in the last 168 hours.  DDimer No results for input(s): "DDIMER" in the last 168 hours.   Radiology    DG CHEST PORT 1 VIEW Result Date: 06/09/2023 CLINICAL DATA:  Pleural effusion. EXAM: PORTABLE CHEST 1 VIEW COMPARISON:  Chest radiograph dated 06/07/2023. FINDINGS: The heart size and mediastinal contours are within normal limits. There are moderate bilateral pleural effusions with associated atelectasis/airspace disease, increased on the right and unchanged on the left. An enteric tube enters the stomach and terminates below the field of view. IMPRESSION: Moderate bilateral pleural effusions with associated atelectasis/airspace disease, increased on the right and unchanged on the left. Electronically Signed   By: Romona Curls M.D.   On: 06/09/2023 10:21   DG Abd Portable 1V Result Date: 06/08/2023 CLINICAL DATA:  Feeding tube placement. EXAM: PORTABLE ABDOMEN - 1 VIEW COMPARISON:  No comparison studies available. FINDINGS: Feeding tube tip is positioned in the mid stomach. Visualized abdomen demonstrates nonspecific bowel gas pattern. Small bilateral pleural effusions evident with retrocardiac collapse/consolidation. IMPRESSION: Feeding tube tip is positioned  in the mid stomach. Electronically Signed   By: Kennith Center M.D.   On: 06/08/2023 14:58   ECHOCARDIOGRAM COMPLETE Result Date: 06/07/2023    ECHOCARDIOGRAM REPORT   Patient Name:   Jeff Ferrell Date of Exam: 06/07/2023 Medical Rec #:  [id number]       Height:       69.0 in Accession #:    [id number]      Weight:       113.5 lb Date of Birth:  September 04, 1950        BSA:          1.624 m Patient Age:    72 years        BP:           127/62 mmHg Patient Gender: M               HR:           81 bpm. Exam Location:  Inpatient Procedure: 2D Echo, Color Doppler, Cardiac Doppler  and Intracardiac            Opacification Agent Indications:    R94.31 Abnormal EKG  History:        Patient has prior history of Echocardiogram examinations, most                 recent 08/13/2015. Risk Factors:Diabetes and ETOH.  Sonographer:    Irving Burton Senior RDCS Referring Phys: [phone] STEPHANIE M REESE IMPRESSIONS  1. Endocardial borders are challenging to assess. Hypokinesis of the anterior/anteroseptal/inferoseptal/inferior wall from base to apex. Left ventricular ejection fraction, by estimation, is 30 to 35%. The left ventricle has moderately decreased function. The left ventricle demonstrates global hypokinesis. Left ventricular diastolic parameters are consistent with Grade II diastolic dysfunction (pseudonormalization).  2. Right ventricular systolic function was not well visualized. The right ventricular size is normal.  3. The mitral valve is normal in structure. No evidence of mitral valve regurgitation.  4. The aortic valve is grossly normal. Aortic valve regurgitation is not visualized.  5. The inferior vena cava is dilated in size with <50% respiratory variability, suggesting right atrial pressure of 15 mmHg. Conclusion(s)/Recommendation(s): Echo is suggestive of prior infarct. FINDINGS  Left Ventricle: Endocardial borders are challenging to assess. Hypokinesis of the anterior/anteroseptal/inferoseptal/inferior wall from base to apex. Left ventricular ejection fraction, by estimation, is 30 to 35%. The left ventricle has moderately decreased function. The left ventricle demonstrates global hypokinesis. Definity contrast agent was given IV to delineate the left ventricular endocardial borders. The left ventricular internal cavity size was normal in size. There is no left ventricular  hypertrophy. Left ventricular diastolic parameters are consistent with Grade II diastolic dysfunction (pseudonormalization). Right Ventricle: The right ventricular size is normal. Right ventricular systolic function was  not well visualized. Left Atrium: Left atrial size was normal in size. Right Atrium: Right atrial size was normal in size. Pericardium: There is no evidence of pericardial effusion. Mitral Valve: The mitral valve is normal in structure. No evidence of mitral valve regurgitation. Tricuspid Valve: Tricuspid valve regurgitation is not demonstrated. Aortic Valve: The aortic valve is grossly normal. Aortic valve regurgitation is not visualized. Pulmonic Valve: Pulmonic valve regurgitation is not visualized. Aorta: The aortic root and ascending aorta are structurally normal, with no evidence of dilitation. Venous: The inferior vena cava is dilated in size with less than 50% respiratory variability, suggesting right atrial pressure of 15 mmHg. IAS/Shunts: The interatrial septum was not well visualized.  LEFT VENTRICLE PLAX 2D LVIDd:  3.90 cm LVIDs:         3.30 cm LV PW:         0.90 cm LV IVS:        0.80 cm LVOT diam:     1.80 cm LV SV:         14 LV SV Index:   9 LVOT Area:     2.54 cm  LV Volumes (MOD) LV vol d, MOD A4C: 67.5 ml LV vol s, MOD A4C: 56.8 ml LV SV MOD A4C:     67.5 ml RIGHT VENTRICLE RV S prime:     8.05 cm/s LEFT ATRIUM             Index        RIGHT ATRIUM           Index LA diam:        2.80 cm 1.72 cm/m   RA Area:     11.30 cm LA Vol (A2C):   27.4 ml 16.87 ml/m  RA Volume:   24.00 ml  14.78 ml/m LA Vol (A4C):   46.1 ml 28.38 ml/m LA Biplane Vol: 36.0 ml 22.17 ml/m  AORTIC VALVE LVOT Vmax:   40.75 cm/s LVOT Vmean:  28.750 cm/s LVOT VTI:    0.057 m  AORTA Ao Root diam: 2.90 cm Ao Asc diam:  3.30 cm  SHUNTS Systemic VTI:  0.06 m Systemic Diam: 1.80 cm Carolan Clines Electronically signed by Carolan Clines Signature Date/Time: 06/07/2023/3:43:24 PM    Final     Cardiac Studies   Echo 06/07/23: 1. Endocardial borders are challenging to assess. Hypokinesis of the  anterior/anteroseptal/inferoseptal/inferior wall from base to apex. Left  ventricular ejection fraction, by estimation, is 30 to  35%. The left  ventricle has moderately decreased  function. The left ventricle demonstrates global hypokinesis. Left  ventricular diastolic parameters are consistent with Grade II diastolic  dysfunction (pseudonormalization).   2. Right ventricular systolic function was not well visualized. The right  ventricular size is normal.   3. The mitral valve is normal in structure. No evidence of mitral valve  regurgitation.   4. The aortic valve is grossly normal. Aortic valve regurgitation is not  visualized.   5. The inferior vena cava is dilated in size with <50% respiratory  variability, suggesting right atrial pressure of 15 mmHg.   Patient Profile     Jeff Ferrell is a 72 y.o. male with a hx of diabetes, PVD, osteomyelitis s/p BKA, adrenal insufficiency, EtOH abuse, prior DVT, discitis and multilevel laminectomy here after being found down.  He was subsequently found to have GI bleed, sepsis from a urinary source and newly diagnosed systolic heart failure.   Assessment & Plan    # Acute systolic and diastolic HF:  Echo reveals newly reduced systolic function different from when he last had an echo here in 2017.  There is also focal wall motion abnormality.  Normal basal function but with mid/apical akinesis.  Differential includes LAD infarct vs Takotsubo cardiomyopathy.  Troponins mildly elevated but flat, likely demand ischemia in setting of acute illness -Plan repeat echocardiogram as he recovers.  If EF remains reduced, and resolution of anemia and AKI, would plan ischemic evaluation    # Mixed septic/hemorrhagic/cardiogenic shock: Patient remains on pressors as above.  Multiple reasons to be hypotensive including GI bleed, infection, and newly reduced EF, though it is unclear how long his EF has been reduced.  Management per ICU team.   # Ventricular  bigeminy: # Ventricular trigeminy:  Suspect that this will continue to correct with treatment of his underlying metabolic  derangements, acute blood loss anemia and infection.  Would favor adding a beta blocker when BP permits.          For questions or updates, please contact [facility] Please consult [email or web address] for contact info under        Signed, Little Ishikawa, MD  06/09/2023, 1:13 PM

## 2023-06-09 NOTE — Progress Notes (Signed)
eLink Physician-Brief Progress Note Patient Name: Jeff Ferrell DOB: 09-19-1950 MRN: [id number]   Date of Service  06/09/2023  HPI/Events of Note  Notified of dusky fingers with good cuff pressures. RT preparing to discontinue arterial line.  eICU Interventions  Ordered given to go ahead and discontinue arterial line Continued monitoring pulses and adequacy of perfusion     Intervention Category Intermediate Interventions: Other:  Darl Pikes 06/09/2023, 9:15 PM

## 2023-06-09 NOTE — Plan of Care (Signed)
  Problem: Nutrition: Goal: Adequate nutrition will be maintained Outcome: Progressing   Problem: Elimination: Goal: Will not experience complications related to bowel motility Outcome: Progressing Goal: Will not experience complications related to urinary retention Outcome: Progressing   Problem: Pain Management: Goal: General experience of comfort will improve Outcome: Progressing   Problem: Education: Goal: Knowledge of General Education information will improve Description: Including pain rating scale, medication(s)/side effects and non-pharmacologic comfort measures Outcome: Not Progressing   Problem: Clinical Measurements: Goal: Respiratory complications will improve Outcome: Not Progressing Goal: Cardiovascular complication will be avoided Outcome: Not Progressing

## 2023-06-09 NOTE — Progress Notes (Signed)
[facility] ADULT ICU REPLACEMENT PROTOCOL   The patient does apply for the [facility] Adult ICU Electrolyte Replacment Protocol based on the criteria listed below:   1.Exclusion criteria: TCTS, ECMO, Dialysis, and Myasthenia Gravis patients 2. Is GFR >/= 30 ml/min? Yes.    Patient's GFR today is 55 3. Is SCr </= 2? Yes.   Patient's SCr is 1.35 mg/dL 4. Did SCr increase >/= 0.5 in 24 hours? No. 5.Pt's weight >40kg  Yes.   6. Abnormal electrolyte(s): Potassium 2.9, Phos 1.2  7. Electrolytes replaced per protocol 8.  Call MD STAT for K+ </= 2.5, Phos </= 1, or Mag </= 1 Physician:  Dr. Theresia Majors Jeff Ferrell 06/09/2023 4:11 AM

## 2023-06-10 ENCOUNTER — Inpatient Hospital Stay (HOSPITAL_COMMUNITY): Payer: No Typology Code available for payment source

## 2023-06-10 DIAGNOSIS — I5041 Acute combined systolic (congestive) and diastolic (congestive) heart failure: Secondary | ICD-10-CM | POA: Diagnosis not present

## 2023-06-10 DIAGNOSIS — J9601 Acute respiratory failure with hypoxia: Secondary | ICD-10-CM | POA: Diagnosis not present

## 2023-06-10 DIAGNOSIS — I998 Other disorder of circulatory system: Secondary | ICD-10-CM

## 2023-06-10 DIAGNOSIS — N179 Acute kidney failure, unspecified: Secondary | ICD-10-CM | POA: Diagnosis not present

## 2023-06-10 DIAGNOSIS — I742 Embolism and thrombosis of arteries of the upper extremities: Secondary | ICD-10-CM | POA: Diagnosis not present

## 2023-06-10 DIAGNOSIS — R579 Shock, unspecified: Secondary | ICD-10-CM | POA: Diagnosis not present

## 2023-06-10 LAB — BASIC METABOLIC PANEL
Anion gap: 12 (ref 5–15)
BUN: 35 mg/dL — ABNORMAL HIGH (ref 8–23)
CO2: 15 mmol/L — ABNORMAL LOW (ref 22–32)
Calcium: 7.3 mg/dL — ABNORMAL LOW (ref 8.9–10.3)
Chloride: 110 mmol/L (ref 98–111)
Creatinine, Ser: 1.42 mg/dL — ABNORMAL HIGH (ref 0.61–1.24)
GFR, Estimated: 52 mL/min — ABNORMAL LOW (ref >60)
Glucose, Bld: 143 mg/dL — ABNORMAL HIGH (ref 70–99)
Potassium: 5.9 mmol/L — ABNORMAL HIGH (ref 3.5–5.1)
Sodium: 137 mmol/L (ref 135–145)

## 2023-06-10 LAB — CBC
HCT: 29.4 % — ABNORMAL LOW (ref 39.0–52.0)
Hemoglobin: 9.4 g/dL — ABNORMAL LOW (ref 13.0–17.0)
MCH: 32 pg (ref 26.0–34.0)
MCHC: 32 g/dL (ref 30.0–36.0)
MCV: 100 fL (ref 80.0–100.0)
Platelets: 60 10*3/uL — ABNORMAL LOW (ref 150–400)
RBC: 2.94 MIL/uL — ABNORMAL LOW (ref 4.22–5.81)
RDW: 16 % — ABNORMAL HIGH (ref 11.5–15.5)
WBC: 29 10*3/uL — ABNORMAL HIGH (ref 4.0–10.5)
nRBC: 2.5 % — ABNORMAL HIGH (ref 0.0–0.2)

## 2023-06-10 LAB — GLUCOSE, CAPILLARY
Glucose-Capillary: 108 mg/dL — ABNORMAL HIGH (ref 70–99)
Glucose-Capillary: 156 mg/dL — ABNORMAL HIGH (ref 70–99)
Glucose-Capillary: 204 mg/dL — ABNORMAL HIGH (ref 70–99)
Glucose-Capillary: 263 mg/dL — ABNORMAL HIGH (ref 70–99)
Glucose-Capillary: 77 mg/dL (ref 70–99)

## 2023-06-10 LAB — PHOSPHORUS: Phosphorus: 2.3 mg/dL — ABNORMAL LOW (ref 2.5–4.6)

## 2023-06-10 LAB — MAGNESIUM: Magnesium: 2.1 mg/dL (ref 1.7–2.4)

## 2023-06-10 MED ORDER — ALTEPLASE 2 MG IJ SOLR
2.0000 mg | Freq: Once | INTRAMUSCULAR | Status: DC | PRN
  Filled 2023-06-10: qty 2

## 2023-06-10 MED ORDER — SODIUM PHOSPHATES 45 MMOLE/15ML IV SOLN
15.0000 mmol | Freq: Once | INTRAVENOUS | Status: AC
  Administered 2023-06-10: 15 mmol via INTRAVENOUS
  Filled 2023-06-10: qty 5

## 2023-06-10 MED ORDER — FENTANYL CITRATE PF 50 MCG/ML IJ SOSY
25.0000 ug | PREFILLED_SYRINGE | INTRAMUSCULAR | Status: AC | PRN
  Administered 2023-06-10 – 2023-06-12 (×10): 100 ug via INTRAVENOUS
  Filled 2023-06-10 (×10): qty 2

## 2023-06-10 MED ORDER — HYDROCORTISONE SOD SUC (PF) 100 MG IJ SOLR
100.0000 mg | Freq: Two times a day (BID) | INTRAMUSCULAR | Status: DC
Start: 2023-06-10 — End: 2023-06-11
  Administered 2023-06-10 – 2023-06-11 (×2): 100 mg via INTRAVENOUS
  Filled 2023-06-10 (×2): qty 2

## 2023-06-10 MED ORDER — DEXMEDETOMIDINE HCL IN NACL 400 MCG/100ML IV SOLN
0.0000 ug/kg/h | INTRAVENOUS | Status: DC
  Administered 2023-06-10: 0.4 ug/kg/h via INTRAVENOUS
  Administered 2023-06-11: 1.1 ug/kg/h via INTRAVENOUS
  Administered 2023-06-11 (×3): 0.9 ug/kg/h via INTRAVENOUS
  Administered 2023-06-12: 0.7 ug/kg/h via INTRAVENOUS
  Administered 2023-06-12: 1.1 ug/kg/h via INTRAVENOUS
  Filled 2023-06-10 (×5): qty 100

## 2023-06-10 NOTE — Progress Notes (Signed)
VASCULAR LAB    Left upper extremity arterial duplex has been performed.  See CV proc for preliminary results.   Kooper Chriswell, RVT 06/10/2023, 4:45 PM

## 2023-06-10 NOTE — Progress Notes (Signed)
Rounding Note    Patient Name: Jeff Ferrell Date of Encounter: 06/10/2023  [facility] Cardiologist: None   Subjective   Renal function stable (creatinine 2.2 > 1.7 > 1.35>1.42).  Hemoglobin improved 7.5>9.4.  Off pressors. Remains intubated and sedated.  FiO2 30%, PEEP 5.   Inpatient Medications    Scheduled Meds:  Chlorhexidine Gluconate Cloth  6 each Topical Daily   docusate  100 mg Per Tube BID   hydrocortisone sod succinate (SOLU-CORTEF) inj  100 mg Intravenous Q12H   insulin aspart  0-20 Units Subcutaneous Q4H   insulin detemir  5 Units Subcutaneous Daily   nutrition supplement (JUVEN)  1 packet Per Tube BID BM   mouth rinse  15 mL Mouth Rinse Q2H   pantoprazole (PROTONIX) IV  40 mg Intravenous Q12H   thiamine  100 mg Per Tube Daily   Continuous Infusions:  dexmedetomidine (PRECEDEX) IV infusion 1 mcg/kg/hr (06/10/23 1212)   feeding supplement (OSMOLITE 1.2 CAL) 50 mL/hr at 06/10/23 1100   meropenem (MERREM) IV Stopped (06/10/23 1008)   norepinephrine (LEVOPHED) Adult infusion Stopped (06/10/23 0807)   sodium phosphate 15 mmol in dextrose 5 % 250 mL infusion 43 mL/hr at 06/10/23 1100   PRN Meds: alteplase, dextrose, fentaNYL (SUBLIMAZE) injection, mouth rinse   Vital Signs    Vitals:   06/10/23 1000 06/10/23 1047 06/10/23 1100 06/10/23 1113  BP: 101/63 107/64 103/61 101/60  Pulse: 70 66 64 66  Resp: (!) 24 (!) 21 19 (!) 21  Temp:    97.8 F (36.6 C)  TempSrc:    Axillary  SpO2: 100% 100% 99% 100%  Weight:      Height:        Intake/Output Summary (Last 24 hours) at 06/10/2023 1254 Last data filed at 06/10/2023 1100 Gross per 24 hour  Intake 1705.23 ml  Output 1225 ml  Net 480.23 ml      06/10/2023    2:12 AM 06/09/2023    5:00 AM 06/08/2023    3:49 AM  Last 3 Weights  Weight (lbs) 113 lb 5.1 oz 113 lb 5.1 oz 114 lb 10.2 oz  Weight (kg) 51.4 kg 51.4 kg 52 kg      Telemetry    Sinus rhythm.  - Personally Reviewed  ECG     No new ECG - Personally Reviewed  Physical Exam   VS:  BP 101/60   Pulse 66   Temp 97.8 F (36.6 C) (Axillary)   Resp (!) 21   Ht 5\' 9"  (1.753 m)   Wt 51.4 kg   SpO2 100%   BMI 16.73 kg/m  , BMI Body mass index is 16.73 kg/m. GENERAL: Critically ill-appearing.  Intubated and sedated LUNGS:  Mechanical breath sounds HEART:  RRR.  no murmurs ABD: Nondistended EXT: Bilateral AKA SKIN:  No rashes NEURO:  Unable to assess.  Intubated and sedated. PSYCH: Unable to assess.  Intubated and sedated.   Labs    High Sensitivity Troponin:   Recent Labs  Lab 06/07/23 2323 06/08/23 0152 06/08/23 0345 06/08/23 0522 06/08/23 0732  TROPONINIHS 253* 284* 298* 293* 296*     Chemistry Recent Labs  Lab 06/05/23 2330 06/06/23 0500 06/08/23 0152 06/08/23 0704 06/09/23 0321 06/09/23 1655 06/10/23 0321  NA 123*   < > 128*  --  128*  --  137  K 4.0   < > 3.8  --  2.9*  --  5.9*  CL 96*   < > 100  --  105  --  110  CO2 18*   < > 16*  --  18*  --  15*  GLUCOSE 510*   < > 141*  --  232*  --  143*  BUN 26*   < > 28*  --  27*  --  35*  CREATININE 2.17*   < > 1.72*  --  1.35*  --  1.42*  CALCIUM 6.6*   < > 7.4*  --  7.2*  --  7.3*  MG 1.0*   < >  --    < > 2.1 1.9 2.1  PROT 4.5*  --   --   --   --   --   --   ALBUMIN <1.5*  --   --   --   --   --   --   AST 100*  --   --   --   --   --   --   ALT 30  --   --   --   --   --   --   ALKPHOS 81  --   --   --   --   --   --   BILITOT 1.2*  --   --   --   --   --   --   GFRNONAA 31*   < > 41*  --  55*  --  52*  ANIONGAP 9   < > 12  --  5  --  12   < > = values in this interval not displayed.    Lipids  Recent Labs  Lab 06/06/23 0500  TRIG 43    Hematology Recent Labs  Lab 06/08/23 0345 06/09/23 0321 06/10/23 0321  WBC 31.1* 24.3* 29.0*  RBC 2.45* 2.36* 2.94*  HGB 7.7* 7.5* 9.4*  HCT 21.7* 21.5* 29.4*  MCV 88.6 91.1 100.0  MCH 31.4 31.8 32.0  MCHC 35.5 34.9 32.0  RDW 14.6 15.0 16.0*  PLT 76* 80* 60*   Thyroid No  results for input(s): "TSH", "FREET4" in the last 168 hours.  BNPNo results for input(s): "BNP", "PROBNP" in the last 168 hours.  DDimer No results for input(s): "DDIMER" in the last 168 hours.   Radiology    DG CHEST PORT 1 VIEW Result Date: 06/09/2023 CLINICAL DATA:  Pleural effusion. EXAM: PORTABLE CHEST 1 VIEW COMPARISON:  Chest radiograph dated 06/07/2023. FINDINGS: The heart size and mediastinal contours are within normal limits. There are moderate bilateral pleural effusions with associated atelectasis/airspace disease, increased on the right and unchanged on the left. An enteric tube enters the stomach and terminates below the field of view. IMPRESSION: Moderate bilateral pleural effusions with associated atelectasis/airspace disease, increased on the right and unchanged on the left. Electronically Signed   By: Romona Curls M.D.   On: 06/09/2023 10:21   DG Abd Portable 1V Result Date: 06/08/2023 CLINICAL DATA:  Feeding tube placement. EXAM: PORTABLE ABDOMEN - 1 VIEW COMPARISON:  No comparison studies available. FINDINGS: Feeding tube tip is positioned in the mid stomach. Visualized abdomen demonstrates nonspecific bowel gas pattern. Small bilateral pleural effusions evident with retrocardiac collapse/consolidation. IMPRESSION: Feeding tube tip is positioned in the mid stomach. Electronically Signed   By: Kennith Center M.D.   On: 06/08/2023 14:58    Cardiac Studies   Echo 06/07/23: 1. Endocardial borders are challenging to assess. Hypokinesis of the  anterior/anteroseptal/inferoseptal/inferior wall from base to apex. Left  ventricular ejection fraction, by estimation, is 30 to  35%. The left  ventricle has moderately decreased  function. The left ventricle demonstrates global hypokinesis. Left  ventricular diastolic parameters are consistent with Grade II diastolic  dysfunction (pseudonormalization).   2. Right ventricular systolic function was not well visualized. The right   ventricular size is normal.   3. The mitral valve is normal in structure. No evidence of mitral valve  regurgitation.   4. The aortic valve is grossly normal. Aortic valve regurgitation is not  visualized.   5. The inferior vena cava is dilated in size with <50% respiratory  variability, suggesting right atrial pressure of 15 mmHg.   Patient Profile     Tyresse Makowski is a 72 y.o. male with a hx of diabetes, PVD, osteomyelitis s/p BKA, adrenal insufficiency, EtOH abuse, prior DVT, discitis and multilevel laminectomy here after being found down.  He was subsequently found to have GI bleed, sepsis from a urinary source and newly diagnosed systolic heart failure.   Assessment & Plan    # Acute systolic and diastolic HF:  Echo reveals newly reduced systolic function different from when he last had an echo here in 2017.  There is also wall motion abnormality.  Normal basal function but with mid/apical akinesis.  Differential includes LAD infarct vs Takotsubo cardiomyopathy.  Troponins mildly elevated but flat, likely demand ischemia in setting of acute illness -Plan repeat echocardiogram as he recovers.  If EF remains reduced, and resolution of anemia and AKI, would plan ischemic evaluation.  Can add GDMT as tolerated    # Mixed septic/hemorrhagic/cardiogenic shock: Multiple reasons to be hypotensive including GI bleed, infection, and newly reduced EF, though it is unclear how long his EF has been reduced.  Management per ICU team.  Improving, now off pressors       For questions or updates, please contact [facility] Please consult [email or web address] for contact info under        Signed, Little Ishikawa, MD  06/10/2023, 12:54 PM

## 2023-06-10 NOTE — Procedures (Cosign Needed Addendum)
    Spoke to vascular. US showing radial clot

## 2023-06-10 NOTE — Progress Notes (Addendum)
eLink Physician-Brief Progress Note Patient Name: Jeff Ferrell DOB: 1950/09/03 MRN: [id number]   Date of Service  06/10/2023  HPI/Events of Note  Camera eval done for agitation, getting fenta 50 q2 hrly. Off of precedex that was working last night.  In synchrony with vent.   eICU Interventions  Started precedex drip , call back if needing over 0.7     Intervention Category Intermediate Interventions: Other: Minor Interventions: Agitation / anxiety - evaluation and management  Ranee Gosselin 06/10/2023, 9:49 PM  23:41 Increased precedex ceiling, for agitation. Fenta prn also increased. Get ABG also.

## 2023-06-10 NOTE — Progress Notes (Signed)
Summary of events  Right radial clot w/ black discoloration of index and middle finger.  I spoke to GI Dr Myrtie Neither we reviewed the Endoscopy report and given the size and location of the ulcer there is not an endoscopic procedure or intervention that would lessen the risk of bleeding. He would be a very high risk for bleeding if he receives any anticoagulation and if bleeding were to occur it would require interventional radiology for embolization.   I relayed this to Dr Myra Gianotti (see his consult note),. Give the patients poor health, malnutrition, high risk of bleeding and also high risk of vessel re-occlusion he does not feel that Jeff Ferrell is a surgical candidate.   For now plan is to continue supportive care I updated his son on the events of the day. Including the potential loss of fingers. The son Jeff Ferrell will be reaching out to other family members and will want to have family face to face meeting w/ [place] tomorrow.   Will sign this out to our team   Simonne Martinet ACNP-BC [facility] Pulmonary/Critical Care Pager # [phone] OR # [phone] if no answer

## 2023-06-10 NOTE — Progress Notes (Signed)
Unable to obtain blood return on left femoral CVC lumens x3. RN notified. Recommend PICC placement.

## 2023-06-10 NOTE — Consult Note (Signed)
[facility]ist [facility]  Patient name: Jeff Ferrell MRN: [id number] DOB: [birth year] Sex: male   REQUESTING PROVIDER:    ICU   REASON FOR CONSULT:    Left radial artery occlusion  HISTORY OF PRESENT ILLNESS:   Jeff Ferrell is a 72 y.o. male, who was transferred from [facility] with acute respiratory failure and renal failure.  He was subsequently intubated.  He has a large gastric ulcer with bleeding that was treated during EGD.  He developed multifactorial shock from sepsis, hemorrhage cardiogenic and adrenal insufficiency.  His vasopressors have been weaned down.  Yesterday he was noted to have discoloration of the fingers on the left where his A-line was.  His A-line has been removed.  It was felt that his fingers were becoming more discolored and so a duplex was ordered which showed radial artery occlusion.  I was consulted for management.  The patient is status post bilateral above-knee amputations.  He also has a sacral decubitus.  He is a type II diabetic.  He has a E. coli UTI.  PAST MEDICAL HISTORY    Past Medical History:  Diagnosis Date   Adrenal insufficiency (HCC)    Alcohol use    BPH (benign prostatic hyperplasia)    Chronic back pain    Diabetes mellitus    regulated by diet   Diabetic osteomyelitis (HCC) 03/29/2015   DVT (deep venous thrombosis) (HCC)    IDA (iron deficiency anemia)    MRSA bacteremia    PUD (peptic ulcer disease)    S/P AKA (above knee amputation) bilateral (HCC)    TIA (transient ischemic attack)      FAMILY HISTORY   No family history on file.  SOCIAL HISTORY:   Social History   Socioeconomic History   Marital status: Single    Spouse name: Not on file   Number of children: Not on file   Years of education: Not on file   Highest education level: Not on file  Occupational History   Not on file  Tobacco Use   Smoking status: Never   Smokeless tobacco: Not on file   Substance and Sexual Activity   Alcohol use: Yes    Alcohol/week: 6.0 standard drinks of alcohol    Types: 6 Cans of beer per week    Comment: daily   Drug use: No   Sexual activity: Not on file  Other Topics Concern   Not on file  Social History Narrative   Not on file   Social Drivers of Health   Financial Resource Strain: Not on file  Food Insecurity: Patient Unable To Answer (06/10/2023)   Hunger Vital Sign    Worried About Running Out of Food in the Last Year: Patient unable to answer    Ran Out of Food in the Last Year: Patient unable to answer  Transportation Needs: Patient Unable To Answer (06/10/2023)   PRAPARE - Transportation    Lack of Transportation (Medical): Patient unable to answer    Lack of Transportation (Non-Medical): Patient unable to answer  Physical Activity: Not on file  Stress: Not on file  Social Connections: Not on file  Intimate Partner Violence: Patient Unable To Answer (06/10/2023)   Humiliation, Afraid, Rape, and Kick questionnaire    Fear of Current or Ex-Partner: Patient unable to answer    Emotionally Abused: Patient unable to answer    Physically Abused: Patient unable to answer    Sexually Abused: Patient unable to answer  ALLERGIES:    Allergies  Allergen Reactions   Penicillins Hives and Rash    Has patient had a PCN reaction causing immediate rash, facial/tongue/throat swelling, SOB or lightheadedness with hypotension: Yes Has patient had a PCN reaction causing severe rash involving mucus membranes or skin necrosis: Yes   Has patient had a PCN reaction that required hospitalization No Has patient had a PCN reaction occurring within the last 10 years: No If all of the above answers are "NO", then may proceed with Cephalosporin use.     CURRENT MEDICATIONS:    Current Facility-Administered Medications  Medication Dose Route Frequency Provider Last Rate Last Admin   alteplase (CATHFLO ACTIVASE) injection 2 mg  2 mg  Intracatheter Once PRN Darl Pikes, MD       Chlorhexidine Gluconate Cloth 2 % PADS 6 each  6 each Topical Daily Lorin Glass, MD   6 each at 06/10/23 0937   dextrose 50 % solution 0-50 mL  0-50 mL Intravenous PRN Cloyd Stagers M, PA-C       docusate (COLACE) 50 MG/5ML liquid 100 mg  100 mg Per Tube BID Cloyd Stagers M, PA-C   100 mg at 06/10/23 2956   feeding supplement (OSMOLITE 1.2 CAL) liquid 1,000 mL  1,000 mL Per Tube Continuous Charlott Holler, MD 50 mL/hr at 06/10/23 1705 Infusion Verify at 06/10/23 1705   fentaNYL (SUBLIMAZE) injection 25-100 mcg  25-100 mcg Intravenous Q2H PRN Migdalia Dk, MD   100 mcg at 06/10/23 1658   hydrocortisone sodium succinate (SOLU-CORTEF) 100 MG injection 100 mg  100 mg Intravenous Q12H Charlott Holler, MD       insulin aspart (novoLOG) injection 0-20 Units  0-20 Units Subcutaneous Q4H Charlott Holler, MD   7 Units at 06/10/23 1658   insulin detemir (LEVEMIR) injection 5 Units  5 Units Subcutaneous Daily Charlott Holler, MD   5 Units at 06/10/23 0946   meropenem (MERREM) 1 g in sodium chloride 0.9 % 100 mL IVPB  1 g Intravenous Q12H Charlott Holler, MD   Stopped at 06/10/23 1008   norepinephrine (LEVOPHED) 4mg  in (0.016 mg/mL) premix infusion  0-40 mcg/min Intravenous Titrated Cloyd Stagers M, PA-C 3.75 mL/hr at 06/10/23 1500 1 mcg/min at 06/10/23 1500   nutrition supplement (JUVEN) (JUVEN) powder packet 1 packet  1 packet Per Tube BID BM Charlott Holler, MD   1 packet at 06/10/23 1405   Oral care mouth rinse  15 mL Mouth Rinse Q2H Lorin Glass, MD   15 mL at 06/10/23 1658   Oral care mouth rinse  15 mL Mouth Rinse PRN Lorin Glass, MD       pantoprazole (PROTONIX) injection 40 mg  40 mg Intravenous Q12H Leslye Peer, MD   40 mg at 06/10/23 2130   thiamine (VITAMIN B1) tablet 100 mg  100 mg Per Tube Daily Charlott Holler, MD   100 mg at 06/10/23 0936    REVIEW OF SYSTEMS:   Unable to obtain given the patient is  intubated  PHYSICAL EXAM:   Vitals:   06/10/23 1300 06/10/23 1400 06/10/23 1500 06/10/23 1526  BP: (!) 97/54 (!) 99/54 (!) 89/64   Pulse: 68 78 76   Resp: 19 (!) 21 (!) 28   Temp:    98.3 F (36.8 C)  TempSrc:    Oral  SpO2: 99% 100% 100%   Weight:      Height:  GENERAL: The patient is a well-nourished male, in no acute distress. The vital signs are documented above. CARDIAC: There is a regular rate and rhythm.  VASCULAR: Palpable left brachial pulse.  No discernible left radial Doppler signal.  Ischemic changes to the thumb index and  middle finger PULMONARY: On ventilator ABDOMEN: Soft and non-tender, ostomy MUSCULOSKELETAL: Bilateral above-knee amputation NEUROLOGIC: Sedated SKIN: See photos below   STUDIES:   I have reviewed the duplex which shows left radial artery occlusion  ASSESSMENT and PLAN   Left radial artery occlusion with ischemic changes to the left 3 fingers visualized above and pictures.  I have had lengthy conversations with the critical care team.  Patient remains critically ill.  I do not think that he is a good candidate for surgical intervention.  Intervention would likely require heparin and with his recent GI bleed this could potentially be very problematic.  In addition he cannot have antiplatelet therapy.  Therefore I doubt thrombectomy could be durable and with the duration of ischemia likely would not change the outcome of his fingers.  He is at risk for amputation.  I would try to wean [place] of vasopressors.   Charlena Cross, MD, FACS [facility] [phone] Pager [phone]

## 2023-06-10 NOTE — Progress Notes (Signed)
eLink Physician-Brief Progress Note Patient Name: Jeff Ferrell DOB: 07/30/1950 MRN: [id number]   Date of Service  06/10/2023  HPI/Events of Note  Unable to draw from c-line for labs, phlebotomy drew but only pediatric size tube for labs and RN was told it was hemolyzed, K+ 5.9,  IV team recommened PICC line orders  eICU Interventions  Ordered a trial of cathflo for now Will defer to bedside rounding team option for access if cathflo does not work Discussed with BSRN     Intervention Category Intermediate Interventions: Electrolyte abnormality - evaluation and management  Darl Pikes 06/10/2023, 6:43 AM

## 2023-06-10 NOTE — Progress Notes (Addendum)
E-link electrolyte replacement protocol - Pharmacy  Phos 2.3, K 5.9, CrCl = 33.7 mL/min.   15 mmol NaPhos ordered   Cedric Fishman, PharmD, BCPS, BCCCP Clinical Pharmacist

## 2023-06-10 NOTE — Plan of Care (Signed)
  Problem: Clinical Measurements: Goal: Respiratory complications will improve Outcome: Progressing Goal: Cardiovascular complication will be avoided Outcome: Progressing   Problem: Nutrition: Goal: Adequate nutrition will be maintained Outcome: Progressing   Problem: Pain Management: Goal: General experience of comfort will improve Outcome: Progressing   Problem: Nutritional: Goal: Maintenance of adequate nutrition will improve Outcome: Progressing   Problem: Safety: Goal: Non-violent Restraint(s) Outcome: Progressing Note: Maintain restraints for line and tube safety

## 2023-06-10 NOTE — Progress Notes (Addendum)
NAME:  Jeff Ferrell, MRN:  [id number], DOB:  1951-03-28, LOS: 5 ADMISSION DATE:  06/05/2023, CONSULTATION DATE:  06/05/23 REFERRING MD:  Malva Cogan transfer, CHIEF COMPLAINT:  shock, acute respiratory failure   History of present illness   72 year old man who presented to [facility] as a transfer from [facility] for shock, acute respiratory failure and acute renal failure. PMHx significant for adrenal insufficiency, TIA, DVT, IDA, PUD, T2DM (c/b diabetic peripheral neuropathy/foot wounds and /;osteomyelitis requiring bilateral AKAs), low back pain 2/2 discitis s/p multilevel laminectomy, BPH.   Patient is intubated/sedated, therefore history is obtained from chart review of records from [facility]. Patient was reportedly found down at home ~0900 12/17 with dark blood coming from ostomy. Glucose borderline low at 67 on EMS arrival and patient was noted to be cold, bradycardic and stiff. Required bagging with EMS due to insufficient respiratory effort. Ultimately required intubation.R femoral central line placed. At [facility], labs were notable for WBC 35, Hgb 8.4. Na 123, CO2 6.5, BUN 31/Cr 2.88. AST 60. Ammonia 86. AG 17. Vanc/Zosyn were initiated as well as bicarbonate gtt. Levophed started for vasopressor support.   Transferred to [facility] for higher level of care.  Past Medical History  Adrenal insufficiency Diabetic OM s/p AKA bilateral IDA, PUD BPH Alcohol use disorder  Significant Hospital Events   12/17 - Presented to [facility] as a transfer from [facility] for shock, ARF. Intubated, on pressors. PCCM consulted for admission.  12/18- bedside EGD with large oozing ulcer, no varices. Hemostatic gel placed and ulcer base biopsied. CT head negative 12/19- echo with EF 30-35%, apical hypokinesis 12/20 failed SBT for apnea  Interim history/subjective:  Central line didn't draw back, now working. Left fingers dusky  Objective   Blood pressure (!) 121/91, pulse  77, temperature (!) 97.4 F (36.3 C), temperature source Axillary, resp. rate (!) 24, height 5\' 9"  (1.753 m), weight 51.4 kg, SpO2 100%.    Vent Mode: PRVC FiO2 (%):  [30 %] 30 % Set Rate:  [24 bmp] 24 bmp Vt Set:  [0.5 mL-500 mL] 500 mL PEEP:  [5 cmH20] 5 cmH20 Plateau Pressure:  [16 cmH20-23 cmH20] 20 cmH20   Intake/Output Summary (Last 24 hours) at 06/10/2023 0841 Last data filed at 06/10/2023 0700 Gross per 24 hour  Intake 1904.21 ml  Output 1625 ml  Net 279.21 ml   Filed Weights   06/08/23 0349 06/09/23 0500 06/10/23 4098  Weight: 52 kg 51.4 kg 51.4 kg    Examination: Gen:      Intubated, sedated, acutely ill appearing HEENT:  ETT to vent Lungs:    sounds of mechanical ventilation auscultated no wheeze CV:         tachycardic, regular Abd:      + bowel sounds; soft, non-tender; no palpable masses, no distension Ext:    Bilateral AK, left fingers dusky Skin:      Warm and dry; no rashes Neuro:   sedated, RASS -3, moves all 4 extremities    Labs and imaging reviewed: Na 137 K 5.9 Cl 110 Cr 1.42 Phos 2.3    Resolved Hospital Problem list   AGMA resolved Septic shock resolved.   Assessment & Plan:  72 y.o. year old male with PUD, reported alcohol abuse, adrenal insufficiency  Multifactorial shock - mixed septic, hemorrhagic, cardiogenic Adrenal insufficiency  -Trend CBC, f/u surgical pathology -levophed with MAP goal >65 -IV PPI BID - off vasopressors this morning. Will taper down hydrocortisone with 100  q12 today  ABLA secondary to UGI Bleed Gastric ulceration Large gastric ulcer with visible vessel seen on EGD 12/18, ulcer concerning for malignancy and biopsy was taken.  If rebleeds GI would recommend upper endoscopy versus consulting IR for embolization. -Continue pantoprazole 40 mg IV every 6 hours -Follow-up pathology results - daily H&H  Ecoli UTI - initially treated with ceftriaxone, cultures now positive for ESBL organisim, will broaden to  merrem  Acute HFrEF Echo with EF of 30 to 35%.  Ventricular bigeminy -Appreciate cardiology recommendations, will need ischemic evaluation once recovers from acute illness -Remains on Levophed on and off -Consider adding beta-blocker and additional GDMT once able -Ischemic evaluation pending stabilization  Acute hypoxemic respiratory failure  Small left pleural effusion Intubated at [facility] ED 12/17. He does have bilateral pleural effusions L>R. For now will monitor with serial CXR, cxr 12/21 ordered - Continue full vent support (4-8cc/kg IBW) - Wean FiO2 for O2 sat > 90% - Daily WUA/SBT, failing SBT x 3 days so far.  - VAP bundle - Pulmonary hygiene - PAD protocol for sedation: Propofol and Fentanyl for goal RASS 0 to -1 - CXR in 12/21 AM   Non oliguric AKI/ARF in the setting of sepsis/ATN NAGMA Hypokalemia Hypophosphatemia - daily bmet - replete electrolytes - monitor UOP - may benefit from diuresis  Anisocoria Hx of TIA - head ct 12/18 unremarkable.  T2DM Hyperglycemia CBG 232. Will increase SSI for goal CBG 140-180. Adding long acting back - likely exacerbated by steroids, may need to stop long acting once off steroids.   Thrombocytopenia, reactive -trend CBC  Peripheral neuropathy s/p bilateral AKA Multiple skin wounds including sacral decubitus ulcer, POA -Wound care consult  GOC - Appreciate palliative assistance.family meeting 12/20 with palliative care.   Best practice:  Diet: NPO, tube feeds Pain/Anxiety/Delirium protocol (if indicated): Fentanyl, precedex DVT prophylaxis: none GI prophylaxis: PPI Glucose control: SSI, sensitive Code Status: full code Family Communication: son updated 06/10/23, Mamie Levers.   The patient is critically ill due to respiratory failure, .  Critical care was necessary to treat or prevent imminent or life-threatening deterioration.  Critical care was time spent personally by me on the following activities: development of  treatment plan with patient and/or surrogate as well as nursing, discussions with consultants, evaluation of patient's response to treatment, examination of patient, obtaining history from patient or surrogate, ordering and performing treatments and interventions, ordering and review of laboratory studies, ordering and review of radiographic studies, pulse oximetry, re-evaluation of patient's condition and participation in multidisciplinary rounds.   Critical Care Time devoted to patient care services described in this note is 32 minutes. This time reflects time of care of this signee Charlott Holler . This critical care time does not reflect separately billable procedures or procedure time, teaching time or supervisory time of PA/NP/Med student/Med Resident etc but could involve care discussion time.       Mickel Baas Pulmonary [facility] Critical Care Medicine 06/10/2023 8:41 AM  Pager: see AMION  If no response to pager , please call critical care on call (see AMION) until 7pm After 7:00 pm call Elink

## 2023-06-10 NOTE — Procedures (Signed)
Proced

## 2023-06-11 DIAGNOSIS — N179 Acute kidney failure, unspecified: Secondary | ICD-10-CM | POA: Diagnosis not present

## 2023-06-11 DIAGNOSIS — J9601 Acute respiratory failure with hypoxia: Secondary | ICD-10-CM | POA: Diagnosis not present

## 2023-06-11 DIAGNOSIS — R579 Shock, unspecified: Secondary | ICD-10-CM | POA: Diagnosis not present

## 2023-06-11 DIAGNOSIS — I742 Embolism and thrombosis of arteries of the upper extremities: Secondary | ICD-10-CM | POA: Diagnosis not present

## 2023-06-11 LAB — CULTURE, BLOOD (ROUTINE X 2)
Culture: NO GROWTH
Culture: NO GROWTH

## 2023-06-11 LAB — POCT I-STAT 7, (LYTES, BLD GAS, ICA,H+H)
Acid-base deficit: 4 mmol/L — ABNORMAL HIGH (ref 0.0–2.0)
Bicarbonate: 20.3 mmol/L (ref 20.0–28.0)
Calcium, Ion: 1.17 mmol/L (ref 1.15–1.40)
HCT: 24 % — ABNORMAL LOW (ref 39.0–52.0)
Hemoglobin: 8.2 g/dL — ABNORMAL LOW (ref 13.0–17.0)
O2 Saturation: 63 %
Patient temperature: 98.6
Potassium: 3.5 mmol/L (ref 3.5–5.1)
Sodium: 144 mmol/L (ref 135–145)
TCO2: 21 mmol/L — ABNORMAL LOW (ref 22–32)
pCO2 arterial: 33.4 mm[Hg] (ref 32–48)
pH, Arterial: 7.392 (ref 7.35–7.45)
pO2, Arterial: 33 mm[Hg] — CL (ref 83–108)

## 2023-06-11 LAB — BASIC METABOLIC PANEL
Anion gap: 8 (ref 5–15)
BUN: 38 mg/dL — ABNORMAL HIGH (ref 8–23)
CO2: 18 mmol/L — ABNORMAL LOW (ref 22–32)
Calcium: 7 mg/dL — ABNORMAL LOW (ref 8.9–10.3)
Chloride: 116 mmol/L — ABNORMAL HIGH (ref 98–111)
Creatinine, Ser: 1.34 mg/dL — ABNORMAL HIGH (ref 0.61–1.24)
GFR, Estimated: 56 mL/min — ABNORMAL LOW (ref >60)
Glucose, Bld: 146 mg/dL — ABNORMAL HIGH (ref 70–99)
Potassium: 3.4 mmol/L — ABNORMAL LOW (ref 3.5–5.1)
Sodium: 142 mmol/L (ref 135–145)

## 2023-06-11 LAB — GLUCOSE, CAPILLARY
Glucose-Capillary: 149 mg/dL — ABNORMAL HIGH (ref 70–99)
Glucose-Capillary: 152 mg/dL — ABNORMAL HIGH (ref 70–99)
Glucose-Capillary: 152 mg/dL — ABNORMAL HIGH (ref 70–99)
Glucose-Capillary: 161 mg/dL — ABNORMAL HIGH (ref 70–99)
Glucose-Capillary: 189 mg/dL — ABNORMAL HIGH (ref 70–99)
Glucose-Capillary: 86 mg/dL (ref 70–99)
Glucose-Capillary: 92 mg/dL (ref 70–99)

## 2023-06-11 LAB — PREPARE RBC (CROSSMATCH)

## 2023-06-11 LAB — CBC
HCT: 21.4 % — ABNORMAL LOW (ref 39.0–52.0)
Hemoglobin: 6.9 g/dL — CL (ref 13.0–17.0)
MCH: 31.4 pg (ref 26.0–34.0)
MCHC: 32.2 g/dL (ref 30.0–36.0)
MCV: 97.3 fL (ref 80.0–100.0)
Platelets: 53 10*3/uL — ABNORMAL LOW (ref 150–400)
RBC: 2.2 MIL/uL — ABNORMAL LOW (ref 4.22–5.81)
RDW: 16 % — ABNORMAL HIGH (ref 11.5–15.5)
WBC: 21.1 10*3/uL — ABNORMAL HIGH (ref 4.0–10.5)
nRBC: 0.9 % — ABNORMAL HIGH (ref 0.0–0.2)

## 2023-06-11 LAB — MAGNESIUM: Magnesium: 1.7 mg/dL (ref 1.7–2.4)

## 2023-06-11 MED ORDER — ONDANSETRON HCL 4 MG/2ML IJ SOLN
4.0000 mg | Freq: Four times a day (QID) | INTRAMUSCULAR | Status: DC | PRN
  Administered 2023-06-11: 4 mg via INTRAVENOUS
  Filled 2023-06-11: qty 2

## 2023-06-11 MED ORDER — SODIUM CHLORIDE 0.9% IV SOLUTION
Freq: Once | INTRAVENOUS | Status: AC
  Administered 2023-06-11: 10 mL/h via INTRAVENOUS

## 2023-06-11 MED ORDER — POTASSIUM CHLORIDE 20 MEQ PO PACK
40.0000 meq | PACK | Freq: Once | ORAL | Status: AC
  Administered 2023-06-11: 40 meq
  Filled 2023-06-11: qty 2

## 2023-06-11 MED ORDER — INSULIN ASPART 100 UNIT/ML IJ SOLN
0.0000 [IU] | INTRAMUSCULAR | Status: DC
  Administered 2023-06-11: 2 [IU] via SUBCUTANEOUS
  Administered 2023-06-11: 1 [IU] via SUBCUTANEOUS
  Administered 2023-06-12 (×2): 3 [IU] via SUBCUTANEOUS
  Administered 2023-06-12: 2 [IU] via SUBCUTANEOUS

## 2023-06-11 MED ORDER — OXYCODONE HCL 5 MG PO TABS
5.0000 mg | ORAL_TABLET | Freq: Four times a day (QID) | ORAL | Status: DC
  Administered 2023-06-11 – 2023-06-12 (×4): 5 mg
  Filled 2023-06-11 (×4): qty 1

## 2023-06-11 MED ORDER — QUETIAPINE FUMARATE 50 MG PO TABS
50.0000 mg | ORAL_TABLET | Freq: Two times a day (BID) | ORAL | Status: DC
  Administered 2023-06-11: 50 mg
  Filled 2023-06-11: qty 1

## 2023-06-11 MED ORDER — MAGNESIUM SULFATE 2 GM/50ML IV SOLN
2.0000 g | Freq: Once | INTRAVENOUS | Status: AC
  Administered 2023-06-11: 2 g via INTRAVENOUS
  Filled 2023-06-11: qty 50

## 2023-06-11 NOTE — Progress Notes (Signed)
Nutrition Follow-up  DOCUMENTATION CODES:  Severe malnutrition in context of social or environmental circumstances  INTERVENTION:  Continue TF via Cortrak: Osmolite 1.2 at 27ml/hr ( per day) Start back at 20ml and advance by 10ml q6h back to goal rate Provides 1728 kcal, 80gm protein, free water daily 1 packet Juven BID to support wound healing  NUTRITION DIAGNOSIS:  Severe Malnutrition related to social / environmental circumstances (inadequate energy intake) as evidenced by severe fat depletion, severe muscle depletion. - remains applicable  GOAL:   Patient will meet greater than or equal to 90% of their needs - goal unmet; TF stopped d/t emesis  MONITOR:   I & O's, Vent status, Labs, Weight trends  REASON FOR ASSESSMENT:   Consult Enteral/tube feeding initiation and management  ASSESSMENT:   Pt with hx of DM type 2, PUD, hx of EtOH, colostomy, and hx pf bilateral AKA presented from outside hospital with shock, respiratory failure, and acute renal failure.  12/17 - admitted to [facility] from outside hospital, intubated 12/18 - EGD, Large, oozing gastric ulcer with adherent clot  12/20 - cortrak  Patient is currently intubated on ventilator support. Pt discussed in IDT rounds. No family present at time of visit.   Pt now with L hand ischemia, high risk for amputation.  Noted plans for family meeting tomorrow.   TF was held overnight. MAR reflects pt vomited TF however per discussion in rounds was reported that TF was being suctioned. TF resumed at 32ml/hr this morning. Abdomen soft/non-distended. No further reports of emesis. Will continue with titration to ensure ongoing adequate tolerance.   MV: 10.8 L/min Temp (24hrs), Avg:98.2 F (36.8 C), Min:97.6 F (36.4 C), Max:99.4 F (37.4 C)   Admit weight: 48.5 kg  Current weight: 51.4 kg   Intake/Output Summary (Last 24 hours) at 06/11/2023 1659 Last data filed at 06/11/2023 1600 Gross per 24 hour   Intake 1968.53 ml  Output 1570 ml  Net 398.53 ml   Net IO Since Admission: 4,263.22 mL [06/11/23 1659]  Drains/Lines: Cortrak (tip within stomach) CVC Triple Lumen, left femoral Colostomy RUQ  Nutritionally Relevant Medications: Scheduled Meds:  Chlorhexidine Gluconate Cloth  6 each Topical Daily   docusate  100 mg Per Tube BID   insulin aspart  0-9 Units Subcutaneous Q4H   insulin detemir  5 Units Subcutaneous Daily   nutrition supplement (JUVEN)  1 packet Per Tube BID BM   mouth rinse  15 mL Mouth Rinse Q2H   oxyCODONE  5 mg Per Tube Q6H   pantoprazole (PROTONIX) IV  40 mg Intravenous Q12H   QUEtiapine  50 mg Per Tube BID   thiamine  100 mg Per Tube Daily   Continuous Infusions:  dexmedetomidine (PRECEDEX) IV infusion 0.9 mcg/kg/hr (06/11/23 1550)   feeding supplement (OSMOLITE 1.2 CAL) 20 mL/hr at 06/11/23 1500   meropenem (MERREM) IV Stopped (06/11/23 0934)   norepinephrine (LEVOPHED) Adult infusion Stopped (06/11/23 0004)   Labs Reviewed: Potassium 3.4 BUN 38 Cr 1.34 Phos 2.3 (12/22) GFR 56 CBG ranges from 92-189 mg/dL over the last 24 hours  Diet Order:   Diet Order             Diet NPO time specified  Diet effective now                   EDUCATION NEEDS:  Not appropriate for education at this time  Skin:  Skin Assessment: Reviewed RN Assessment Stage 3: - Sacrum (2x3 cm) - Back,  left upper (3x3 cm) - Back, right upper (2x2 cm) - vertebral column (5x1 cm)  Last BM:  x24 via colostomy  Height:  Ht Readings from Last 1 Encounters:  06/05/23 5\' 9"  (1.753 m)    Weight:  Wt Readings from Last 1 Encounters:  06/11/23 51.4 kg   Ideal Body Weight:  58.2 kg (adjusted by 20% for bilateral AKA)  BMI:  Body mass index is 16.73 kg/m.  Estimated Nutritional Needs:  Kcal:  1600-1900 kcal/d Protein:  75-90g/d Fluid:  1.8L/d  Drusilla Kanner, RDN, LDN Clinical Nutrition

## 2023-06-11 NOTE — Plan of Care (Signed)
He remains intubated , just off pressor support. Will uptitrate GDMT once his blood pressures can tolerate and he is extubated. At this time, will also plan for an ischemic eval. Otherwise, cardiology will follow peripherally

## 2023-06-11 NOTE — Progress Notes (Signed)
[facility] ADULT ICU REPLACEMENT PROTOCOL   The patient does apply for the [facility] Adult ICU Electrolyte Replacment Protocol based on the criteria listed below:   1.Exclusion criteria: TCTS, ECMO, Dialysis, and Myasthenia Gravis patients 2. Is GFR >/= 30 ml/min? Yes.    Patient's GFR today is 56 3. Is SCr </= 2? Yes.   Patient's SCr is 1.34 mg/dL 4. Did SCr increase >/= 0.5 in 24 hours? No. 5.Pt's weight >40kg  Yes.   6. Abnormal electrolyte(s): K+ 3.4,, Mag 1.7  7. Electrolytes replaced per protocol 8.  Call MD STAT for K+ </= 2.5, Phos </= 1, or Mag </= 1 Physician:  Dr.Mohan  Lolita Lenz 06/11/2023 6:18 AM

## 2023-06-11 NOTE — Progress Notes (Signed)
Brief Palliative Medicine Progress Note:  PMT following peripherally for needs/decline:  Medical records reviewed including progress notes, labs, imaging. Patient remains critically ill - intubated and sedated.  Noted Dr. Celine Mans scheduled to meet with family today for GOC.  PMT will continue to follow peripherally and visit with patient and family incrementally for goals of care discussions as appropriate and based on clinical course. If there are any imminent needs please call the service directly.   Thank you for allowing PMT to assist in the care of this patient.  Geofrey Silliman M. Katrinka Blazing [facility] Palliative Medicine Team Team Phone: [phone] NO CHARGE

## 2023-06-11 NOTE — Progress Notes (Signed)
   06/11/23 0810  Adult Ventilator Settings  Vent Type Servo i  Humidity HME  Vent Mode PSV;CPAP  FiO2 (%) 30 %  Pressure Support 10 cmH20  PEEP 5 cmH20   RT placed Pt on CPAP/PS mode and is tolerating fairlywell. Pt has episodes of apnea due to no effort on a +2 trigger. RN and MD aware.

## 2023-06-11 NOTE — Progress Notes (Signed)
Pt has been flipped back to full support due to desaturation and increase agitation.

## 2023-06-11 NOTE — Progress Notes (Addendum)
  Progress Note    06/11/2023 7:13 AM Hospital Day 6  Subjective:  intubated/sedated  Afebrile  Gtts:   none  Vitals:   06/11/23 0630 06/11/23 0645  BP: 104/72 98/60  Pulse: 65 73  Resp: (!) 24 (!) 21  Temp:    SpO2: 100% 99%    Physical Exam: General:  intubated/sedated Extremities:  fingers left hand cool to touch/ischemic appearing  CBC    Component Value Date/Time   WBC 21.1 (H) 06/11/2023 0519   RBC 2.20 (L) 06/11/2023 0519   HGB 6.9 (LL) 06/11/2023 0519   HCT 21.4 (L) 06/11/2023 0519   PLT 53 (L) 06/11/2023 0519   MCV 97.3 06/11/2023 0519   MCH 31.4 06/11/2023 0519   MCHC 32.2 06/11/2023 0519   RDW 16.0 (H) 06/11/2023 0519   LYMPHSABS 0.5 (L) 06/05/2023 2330   MONOABS 1.1 (H) 06/05/2023 2330   EOSABS 0.0 06/05/2023 2330   BASOSABS 0.0 06/05/2023 2330    BMET    Component Value Date/Time   NA 142 06/11/2023 0519   K 3.4 (L) 06/11/2023 0519   CL 116 (H) 06/11/2023 0519   CO2 18 (L) 06/11/2023 0519   GLUCOSE 146 (H) 06/11/2023 0519   BUN 38 (H) 06/11/2023 0519   CREATININE 1.34 (H) 06/11/2023 0519   CALCIUM 7.0 (L) 06/11/2023 0519   GFRNONAA 56 (L) 06/11/2023 0519   GFRAA >60 09/01/2015 1510    INR    Component Value Date/Time   INR 2.1 (H) 06/06/2023 0809     Intake/Output Summary (Last 24 hours) at 06/11/2023 0713 Last data filed at 06/11/2023 0600 Gross per 24 hour  Intake 1547.82 ml  Output 1265 ml  Net 282.82 ml     Assessment/Plan:  72 y.o. male with left radial artery occlusion  Hospital Day 6  -pt critically ill with recent GIB.  Endoscopy revealed that given the size and location of the ulcer, there is no endoscopic procedure or intervention that would lessen risk of bleeding and therefore very high risk for anticoagulation and therefore not a surgical candidate.  Critical care team to meet with family today    Doreatha Massed, [facility] [phone] 06/11/2023 7:13 AM

## 2023-06-11 NOTE — Progress Notes (Addendum)
   06/11/23 1612  Spiritual Encounters  Type of Visit Initial  Care provided to: Pt not available  Conversation partners present during encounter Nurse  OnCall Visit No   Received page to visit patient however patient was not alert at the time of visit

## 2023-06-11 NOTE — Progress Notes (Addendum)
NAME:  Jeff Ferrell, MRN:  [id number], DOB:  02/23/1951, LOS: 6 ADMISSION DATE:  06/05/2023, CONSULTATION DATE:  06/05/23 REFERRING MD:  Malva Cogan transfer, CHIEF COMPLAINT:  shock, acute respiratory failure   History of present illness   72 year old man who presented to [facility] as a transfer from [facility] for shock, acute respiratory failure and acute renal failure. PMHx significant for adrenal insufficiency, TIA, DVT, IDA, PUD, T2DM (c/b diabetic peripheral neuropathy/foot wounds and /;osteomyelitis requiring bilateral AKAs), low back pain 2/2 discitis s/p multilevel laminectomy, BPH.   Patient is intubated/sedated, therefore history is obtained from chart review of records from [facility]. Patient was reportedly found down at home ~0900 12/17 with dark blood coming from ostomy. Glucose borderline low at 67 on EMS arrival and patient was noted to be cold, bradycardic and stiff. Required bagging with EMS due to insufficient respiratory effort. Ultimately required intubation.R femoral central line placed. At [facility], labs were notable for WBC 35, Hgb 8.4. Na 123, CO2 6.5, BUN 31/Cr 2.88. AST 60. Ammonia 86. AG 17. Vanc/Zosyn were initiated as well as bicarbonate gtt. Levophed started for vasopressor support.   [facility].  Past Medical History  Adrenal insufficiency Diabetic OM s/p AKA bilateral IDA, PUD BPH Alcohol use disorder  Significant Hospital Events   12/17 - Presented to [facility] as a transfer from [facility] for shock, ARF. Intubated, on pressors. PCCM consulted for admission.  12/18- bedside EGD with large oozing ulcer, no varices. Hemostatic gel placed and ulcer base biopsied. CT head negative 12/19- echo with EF 30-35%, apical hypokinesis 12/20 failed SBT for apnea 12/22 arterial doppler shows left hand ischemia  Interim history/subjective:  Left hand with arterial thrombus Off vasopressors Received 1  unit prbc Passing PS trial today Got placed on precedex overnight again  Objective   Blood pressure (!) 100/58, pulse 64, temperature 97.6 F (36.4 C), temperature source Oral, resp. rate 18, height 5\' 9"  (1.753 m), weight 51.4 kg, SpO2 100%.    Vent Mode: PSV;CPAP FiO2 (%):  [30 %] 30 % Set Rate:  [24 bmp] 24 bmp Vt Set:  [500 mL] 500 mL PEEP:  [5 cmH20] 5 cmH20 Pressure Support:  [10 cmH20] 10 cmH20 Plateau Pressure:  [20 cmH20] 20 cmH20   Intake/Output Summary (Last 24 hours) at 06/11/2023 1002 Last data filed at 06/11/2023 0600 Gross per 24 hour  Intake 1547.82 ml  Output 1265 ml  Net 282.82 ml   Filed Weights   06/09/23 0500 06/10/23 0212 06/11/23 0231  Weight: 51.4 kg 51.4 kg 51.4 kg    Examination: Gen:      Intubated, sedated, acutely ill appearing HEENT:  ETT to vent Lungs:    sounds of mechanical ventilation auscultated no wheeze CV:         RRR no mrg Abd:      + bowel sounds; soft, non-tender; no palpable masses, no distension Ext:   Left fingers dusky, bilateral AKA Skin:      Warm and dry; no rashes Neuro:   sedated, RASS -2, grimaces to pain   Labs and imaging reviewed: Na 142 K 3.4 Cr 1.34 CO2 18 Hgb 6.9    Resolved Hospital Problem list   AGMA resolved Septic shock resolved.   Assessment & Plan:  72 y.o. year old male with PUD, reported alcohol abuse, adrenal insufficiency  Multifactorial shock - septic shock -Trend CBC, f/u surgical pathology -levophed with MAP goal >65 -  IV PPI BID - off vasopressors this morning. Stop stress dose steroids  ABLA secondary to UGI Bleed Gastric ulceration Large gastric ulcer with visible vessel seen on EGD 12/18, ulcer concerning for malignancy and biopsy was taken.  If rebleeds GI would recommend upper endoscopy versus consulting IR for embolization. -Continue pantoprazole 40 mg IV every 6 hours -Follow-up pathology results - daily H&H  Acute left upper extremity arterial thrombosis - discussed with  GI and vascular surgery, not a candidate for intervention or anticoagulation - high concern for limb loss  ESBL UTI - merrem x 5 days  Acute hypoxemic respiratory failure  - Continue full vent support (4-8cc/kg IBW) - Wean FiO2 for O2 sat > 90% - Daily WUA/SBT, failing SBT x 3 days so far.  - VAP bundle - Pulmonary hygiene - PAD protocol for sedation: Propofol and Fentanyl for goal RASS 0 to -1 - schedule oxycodone to limit sedation  Acute HFrEF Echo with EF of 30 to 35%.  Ventricular bigeminy -Appreciate cardiology recommendations, will need ischemic evaluation once recovers from acute illness -Remains on Levophed on and off -Consider adding beta-blocker and additional GDMT once able -Ischemic evaluation pending stabilization   Non oliguric AKI/ARF in the setting of sepsis/ATN NAGMA Hypokalemia Hypophosphatemia - daily bmet - replete electrolytes - monitor UOP - may benefit from diuresis  Anisocoria Hx of TIA - head ct 12/18 unremarkable.  T2DM - CBGs borderline low - will decrease SSI given now off steroids - continue levemir 5 u  Thrombocytopenia, reactive -trend CBC  Peripheral neuropathy s/p bilateral AKA Multiple skin wounds including sacral decubitus ulcer, POA -Wound care consult  GOC - family meeting today  Best practice:  Diet: NPO, tube feeds Pain/Anxiety/Delirium protocol (if indicated): Fentanyl prn, precedex, scheduled oxycodone DVT prophylaxis: none GI prophylaxis: PPI Glucose control: SSI, sensitive Code Status: full code Family Communication: son updated today. Plan for family meeting at bedside 12/24. Time TBD. He will be coming down from [place].   The patient is critically ill due to respiratory failure, acute limb ischemia.  Critical care was necessary to treat or prevent imminent or life-threatening deterioration.  Critical care was time spent personally by me on the following activities: development of treatment plan with patient  and/or surrogate as well as nursing, discussions with consultants, evaluation of patient's response to treatment, examination of patient, obtaining history from patient or surrogate, ordering and performing treatments and interventions, ordering and review of laboratory studies, ordering and review of radiographic studies, pulse oximetry, re-evaluation of patient's condition and participation in multidisciplinary rounds.   Critical Care Time devoted to patient care services described in this note is 45 minutes. This time reflects time of care of this signee Charlott Holler . This critical care time does not reflect separately billable procedures or procedure time, teaching time or supervisory time of PA/NP/Med student/Med Resident etc but could involve care discussion time.       Charlott Holler [place] [facility] 06/11/2023 10:02 AM  Pager: see AMION  If no response to pager , please call critical care on call (see AMION) until 7pm After 7:00 pm call Elink

## 2023-06-11 NOTE — Progress Notes (Signed)
ABG obtained on 06/01/23 @ 00:46 was venous not arterial.

## 2023-06-12 ENCOUNTER — Inpatient Hospital Stay (HOSPITAL_COMMUNITY): Payer: No Typology Code available for payment source

## 2023-06-12 DIAGNOSIS — Z7189 Other specified counseling: Secondary | ICD-10-CM | POA: Diagnosis not present

## 2023-06-12 DIAGNOSIS — J9601 Acute respiratory failure with hypoxia: Secondary | ICD-10-CM | POA: Diagnosis not present

## 2023-06-12 DIAGNOSIS — Z515 Encounter for palliative care: Secondary | ICD-10-CM | POA: Diagnosis not present

## 2023-06-12 DIAGNOSIS — N179 Acute kidney failure, unspecified: Secondary | ICD-10-CM | POA: Diagnosis not present

## 2023-06-12 DIAGNOSIS — R579 Shock, unspecified: Secondary | ICD-10-CM | POA: Diagnosis not present

## 2023-06-12 LAB — HEMOGLOBIN AND HEMATOCRIT, BLOOD
HCT: 24.8 % — ABNORMAL LOW (ref 39.0–52.0)
Hemoglobin: 8.3 g/dL — ABNORMAL LOW (ref 13.0–17.0)

## 2023-06-12 LAB — POCT I-STAT 7, (LYTES, BLD GAS, ICA,H+H)
Acid-base deficit: 12 mmol/L — ABNORMAL HIGH (ref 0.0–2.0)
Bicarbonate: 12.8 mmol/L — ABNORMAL LOW (ref 20.0–28.0)
Calcium, Ion: 1.02 mmol/L — ABNORMAL LOW (ref 1.15–1.40)
HCT: 22 % — ABNORMAL LOW (ref 39.0–52.0)
Hemoglobin: 7.5 g/dL — ABNORMAL LOW (ref 13.0–17.0)
O2 Saturation: 89 %
Patient temperature: 98.1
Potassium: 4.3 mmol/L (ref 3.5–5.1)
Sodium: 147 mmol/L — ABNORMAL HIGH (ref 135–145)
TCO2: 14 mmol/L — ABNORMAL LOW (ref 22–32)
pCO2 arterial: 26 mm[Hg] — ABNORMAL LOW (ref 32–48)
pH, Arterial: 7.3 — ABNORMAL LOW (ref 7.35–7.45)
pO2, Arterial: 61 mm[Hg] — ABNORMAL LOW (ref 83–108)

## 2023-06-12 LAB — BASIC METABOLIC PANEL
Anion gap: 8 (ref 5–15)
BUN: 51 mg/dL — ABNORMAL HIGH (ref 8–23)
CO2: 17 mmol/L — ABNORMAL LOW (ref 22–32)
Calcium: 7 mg/dL — ABNORMAL LOW (ref 8.9–10.3)
Chloride: 118 mmol/L — ABNORMAL HIGH (ref 98–111)
Creatinine, Ser: 1.41 mg/dL — ABNORMAL HIGH (ref 0.61–1.24)
GFR, Estimated: 53 mL/min — ABNORMAL LOW (ref >60)
Glucose, Bld: 289 mg/dL — ABNORMAL HIGH (ref 70–99)
Potassium: 4.5 mmol/L (ref 3.5–5.1)
Sodium: 143 mmol/L (ref 135–145)

## 2023-06-12 LAB — HEPATIC FUNCTION PANEL
ALT: 39 U/L (ref 0–44)
AST: 61 U/L — ABNORMAL HIGH (ref 15–41)
Albumin: <1.5 g/dL — ABNORMAL LOW (ref 3.5–5.0)
Alkaline Phosphatase: 105 U/L (ref 38–126)
Bilirubin, Direct: 0.9 mg/dL — ABNORMAL HIGH (ref 0.0–0.2)
Indirect Bilirubin: 1.6 mg/dL — ABNORMAL HIGH (ref 0.3–0.9)
Total Bilirubin: 2.5 mg/dL — ABNORMAL HIGH (ref <1.2)
Total Protein: 3.8 g/dL — ABNORMAL LOW (ref 6.5–8.1)

## 2023-06-12 LAB — CBC
HCT: 28.4 % — ABNORMAL LOW (ref 39.0–52.0)
Hemoglobin: 9.4 g/dL — ABNORMAL LOW (ref 13.0–17.0)
MCH: 31.9 pg (ref 26.0–34.0)
MCHC: 33.1 g/dL (ref 30.0–36.0)
MCV: 96.3 fL (ref 80.0–100.0)
Platelets: 79 10*3/uL — ABNORMAL LOW (ref 150–400)
RBC: 2.95 MIL/uL — ABNORMAL LOW (ref 4.22–5.81)
RDW: 16.2 % — ABNORMAL HIGH (ref 11.5–15.5)
WBC: 21.1 10*3/uL — ABNORMAL HIGH (ref 4.0–10.5)
nRBC: 2.3 % — ABNORMAL HIGH (ref 0.0–0.2)

## 2023-06-12 LAB — PREPARE RBC (CROSSMATCH)

## 2023-06-12 LAB — DIC (DISSEMINATED INTRAVASCULAR COAGULATION)PANEL
D-Dimer, Quant: >20 ug{FEU}/mL — ABNORMAL HIGH (ref 0.00–0.50)
Fibrinogen: 87 mg/dL — CL (ref 210–475)
INR: 2.8 — ABNORMAL HIGH (ref 0.8–1.2)
Platelets: 63 10*3/uL — ABNORMAL LOW (ref 150–400)
Prothrombin Time: 29.4 s — ABNORMAL HIGH (ref 11.4–15.2)
Smear Review: NONE SEEN
aPTT: 38 s — ABNORMAL HIGH (ref 24–36)

## 2023-06-12 LAB — MAGNESIUM: Magnesium: 2.3 mg/dL (ref 1.7–2.4)

## 2023-06-12 LAB — PROTIME-INR
INR: 1.8 — ABNORMAL HIGH (ref 0.8–1.2)
Prothrombin Time: 21.4 s — ABNORMAL HIGH (ref 11.4–15.2)

## 2023-06-12 LAB — GLUCOSE, CAPILLARY
Glucose-Capillary: 120 mg/dL — ABNORMAL HIGH (ref 70–99)
Glucose-Capillary: 144 mg/dL — ABNORMAL HIGH (ref 70–99)
Glucose-Capillary: 177 mg/dL — ABNORMAL HIGH (ref 70–99)
Glucose-Capillary: 185 mg/dL — ABNORMAL HIGH (ref 70–99)
Glucose-Capillary: 202 mg/dL — ABNORMAL HIGH (ref 70–99)
Glucose-Capillary: 208 mg/dL — ABNORMAL HIGH (ref 70–99)

## 2023-06-12 LAB — PHOSPHORUS
Phosphorus: 3.6 mg/dL (ref 2.5–4.6)
Phosphorus: 5.2 mg/dL — ABNORMAL HIGH (ref 2.5–4.6)

## 2023-06-12 LAB — SURGICAL PATHOLOGY

## 2023-06-12 LAB — LACTIC ACID, PLASMA: Lactic Acid, Venous: >9 mmol/L (ref 0.5–1.9)

## 2023-06-12 LAB — TROPONIN I (HIGH SENSITIVITY): Troponin I (High Sensitivity): 8632 ng/L (ref <18)

## 2023-06-12 MED ORDER — PANTOPRAZOLE SODIUM 40 MG IV SOLR: 80.0000 mg | Freq: Once | INTRAVENOUS | Status: DC

## 2023-06-12 MED ORDER — SODIUM BICARBONATE 8.4 % IV SOLN
INTRAVENOUS | Status: AC
  Filled 2023-06-12: qty 100

## 2023-06-12 MED ORDER — EPINEPHRINE 1 MG/10ML IJ SOSY: 1.0000 mg | PREFILLED_SYRINGE | Freq: Once | INTRAMUSCULAR | Status: AC

## 2023-06-12 MED ORDER — DEXTROSE 5 % IV SOLN
INTRAVENOUS | Status: DC
  Filled 2023-06-12: qty 150

## 2023-06-12 MED ORDER — SODIUM BICARBONATE 8.4 % IV SOLN
INTRAVENOUS | Status: AC
  Administered 2023-06-12: 100 meq via INTRAVENOUS
  Filled 2023-06-12: qty 50

## 2023-06-12 MED ORDER — PHENYLEPHRINE HCL-NACL 20-0.9 MG/250ML-% IV SOLN
INTRAVENOUS | Status: AC
  Filled 2023-06-12: qty 250

## 2023-06-12 MED ORDER — PHENYLEPHRINE 80 MCG/ML (10ML) SYRINGE FOR IV PUSH (FOR BLOOD PRESSURE SUPPORT)
PREFILLED_SYRINGE | INTRAVENOUS | Status: AC
  Administered 2023-06-12: 200 ug via INTRAVENOUS
  Filled 2023-06-12: qty 10

## 2023-06-12 MED ORDER — SODIUM CHLORIDE 0.9% IV SOLUTION: Freq: Once | INTRAVENOUS | Status: DC

## 2023-06-12 MED ORDER — SODIUM BICARBONATE 8.4 % IV SOLN
INTRAVENOUS | Status: AC
  Administered 2023-06-12: 50 meq
  Filled 2023-06-12: qty 50

## 2023-06-12 MED ORDER — SODIUM BICARBONATE 8.4 % IV SOLN
50.0000 meq | INTRAVENOUS | Status: AC
  Administered 2023-06-12: 50 meq via INTRAVENOUS

## 2023-06-12 MED ORDER — DOCUSATE SODIUM 50 MG/5ML PO LIQD: 100.0000 mg | Freq: Two times a day (BID) | ORAL | Status: DC | PRN

## 2023-06-12 MED ORDER — SODIUM BICARBONATE 8.4 % IV SOLN: 100.0000 meq | Freq: Once | INTRAVENOUS | Status: AC

## 2023-06-12 MED ORDER — EPINEPHRINE HCL 5 MG/250ML IV SOLN IN NS
INTRAVENOUS | Status: AC
  Filled 2023-06-12: qty 250

## 2023-06-12 MED ORDER — INSULIN ASPART 100 UNIT/ML IJ SOLN
0.0000 [IU] | INTRAMUSCULAR | Status: DC
  Administered 2023-06-12 (×2): 3 [IU] via SUBCUTANEOUS
  Administered 2023-06-13: 2 [IU] via SUBCUTANEOUS

## 2023-06-12 MED ORDER — VASOPRESSIN 20 UNITS/100 ML INFUSION FOR SHOCK
INTRAVENOUS | Status: AC
  Administered 2023-06-12: 0.03 [IU]/min via INTRAVENOUS
  Filled 2023-06-12: qty 100

## 2023-06-12 MED ORDER — PHENYLEPHRINE CONCENTRATED 100MG/250ML (0.4 MG/ML) INFUSION SIMPLE
0.0000 ug/min | INTRAVENOUS | Status: DC
  Administered 2023-06-13: 400 ug/min via INTRAVENOUS
  Filled 2023-06-12 (×2): qty 250

## 2023-06-12 MED ORDER — PHENYLEPHRINE 80 MCG/ML (10ML) SYRINGE FOR IV PUSH (FOR BLOOD PRESSURE SUPPORT): 200.0000 ug | PREFILLED_SYRINGE | Freq: Once | INTRAVENOUS | Status: AC

## 2023-06-12 MED ORDER — SODIUM CHLORIDE 0.9% IV SOLUTION: Freq: Once | INTRAVENOUS | Status: AC

## 2023-06-12 MED ORDER — EPINEPHRINE HCL 5 MG/250ML IV SOLN IN NS
0.5000 ug/min | INTRAVENOUS | Status: DC
  Administered 2023-06-12: 0.5 ug/min via INTRAVENOUS
  Administered 2023-06-13: 20 ug/min via INTRAVENOUS
  Filled 2023-06-12: qty 250

## 2023-06-12 MED ORDER — FENTANYL CITRATE PF 50 MCG/ML IJ SOSY
25.0000 ug | PREFILLED_SYRINGE | INTRAMUSCULAR | Status: DC | PRN
  Administered 2023-06-12: 100 ug via INTRAVENOUS
  Administered 2023-06-12: 50 ug via INTRAVENOUS
  Administered 2023-06-12: 100 ug via INTRAVENOUS
  Filled 2023-06-12 (×2): qty 2

## 2023-06-12 MED ORDER — PHENYLEPHRINE HCL-NACL 20-0.9 MG/250ML-% IV SOLN
0.0000 ug/min | INTRAVENOUS | Status: DC
  Administered 2023-06-12: 400 ug/min via INTRAVENOUS
  Administered 2023-06-12: 20 ug/min via INTRAVENOUS
  Administered 2023-06-12 (×4): 400 ug/min via INTRAVENOUS
  Filled 2023-06-12 (×3): qty 250
  Filled 2023-06-12: qty 500

## 2023-06-12 MED ORDER — EPINEPHRINE 1 MG/10ML IJ SOSY
PREFILLED_SYRINGE | INTRAMUSCULAR | Status: AC
  Administered 2023-06-12: 1 mg via INTRAVENOUS
  Filled 2023-06-12: qty 10

## 2023-06-12 MED ORDER — FENTANYL CITRATE PF 50 MCG/ML IJ SOSY
PREFILLED_SYRINGE | INTRAMUSCULAR | Status: AC
  Filled 2023-06-12: qty 2

## 2023-06-12 MED ORDER — CALCIUM GLUCONATE-NACL 2-0.675 GM/100ML-% IV SOLN
2.0000 g | Freq: Once | INTRAVENOUS | Status: AC
  Administered 2023-06-12: 2000 mg via INTRAVENOUS
  Filled 2023-06-12: qty 100

## 2023-06-12 MED ORDER — NOREPINEPHRINE 4 MG/250ML-% IV SOLN
INTRAVENOUS | Status: AC
  Administered 2023-06-12: 4 mg
  Filled 2023-06-12: qty 250

## 2023-06-12 MED ORDER — NOREPINEPHRINE 16 MG/250ML-% IV SOLN
0.0000 ug/min | INTRAVENOUS | Status: DC
  Administered 2023-06-12 (×2): 40 ug/min via INTRAVENOUS
  Filled 2023-06-12 (×3): qty 250

## 2023-06-12 MED ORDER — VASOPRESSIN 20 UNITS/100 ML INFUSION FOR SHOCK
0.0000 [IU]/min | INTRAVENOUS | Status: DC
  Administered 2023-06-12: 0.04 [IU]/min via INTRAVENOUS
  Filled 2023-06-12: qty 100

## 2023-06-12 NOTE — Progress Notes (Addendum)
eLink Physician-Brief Progress Note Patient Name: Jeff Ferrell DOB: 27-Sep-1950 MRN: [id number]   Date of Service  06/12/2023  HPI/Events of Note  Camera : alert Discussed with RN. MAP < 40, son is coming to ICU on his way. DNR. On 100% fio2. PIP ok HR < 100. Art line. On 3 pressors, maxed out, on bicarb drip. In SAKI  In MOF  Stat epi 1 mg, bicarb 100 , neo synephrine 200 mcg stick and epi drip ordered stat. Guarded prognosis.   72 y.o. year old male with PUD, reported alcohol abuse, adrenal insufficiency Septic vs hemorrhagic shock from bleeding gastric ulcerations/p EGD on 18th, s/p PRBC.  AHRF . LA > 9 Low EF CHF. NSTEMI elevated troponin over 8000. Elevated d dimer and low fibrinogen.  AKI, NGMA.ATN LUE arterial thrombosis. Not a candidate for anticoagulation.   Last ABG 7.30/26/61     eICU Interventions  As above Follow ABG, CBC, BMP stat. Guarded prognosis, in multi organ failure.  DNR-no CPR     Intervention Category Major Interventions: Hypotension - evaluation and management Minor Interventions: Communication with other healthcare providers and/or family  Ranee Gosselin 06/12/2023, 10:22 PM  2300 Follow up on the case; 102/53, low diastolic dysfunction making MAP low. Will follow SBP for pressure titrations. Follow labs. Sats low. Fio2 at 100%.   00:33 Hg at 5.6, wbc 33 K. Plt 35.  Decreasing, worsening.  ABG 6.9/24/200's/hco3 at 6.  Discussed with RN. MAP 42.  Called son ,left message.  Again called back, able to connect through.  He is still not able to find a ride to hospital for last 3 hrs. Updated critical condition, in multi organ failure. Almost terminal, not able to make it to morning. He is going to try again to find a ride to visit, until that time ok to continue care plan. - transfuse 2 units of PRBC to improve cardiac output. - 100 x 2 bicarb push - increased bicarb drip to 150 ml/hr. Not a candidate for CRRT also. Reached futile care.    Notified bed side RN same. Ground team also made aware earlier via secure chat.   02:01 RN notified about OG and mouth are bleeding right back out. His canisters are filling with blood. In DIC.  Discussed with Dr Kandis Mannan , ground CCM team. - continue current care, meeting futility care. Too sick to go for IR intervention for bleeding.   02:35 Bleeding over 500 ml. Camera: Oral bleeding also noted apart from OG. In DIC. 4 pressors, MAP 40's. ATN. Low EF heart failure/NSTEMI. Resp failure. Continue current care Son has not come to see him yet.  Notified again bed side CCM Dr Kandis Mannan. He is going to take a look for any need for further heroic measures like massive transfusion-platelets/FFP/PRBC for uncontrolled bleeding.

## 2023-06-12 NOTE — Progress Notes (Addendum)
Family meeting held with palliative care and patient's son Bric Rardin "[name]" and nephew Roe Coombs, and sister Ardis Hughs. We reviewed in detail his extensive medical co-morbidities and high likelihood for clinical decline. They were agreeable to arterial line placement for closer monitoring of blood pressures. During the time it took for family meeting, patient had massive clinical decline with large volume blood suctioned out of his OG tube. He also developed multi-vasopressor shock, atrial arrhythmia and decreased responsiveness. Stat labs sent, emergent 2 units prbc transfused. Called [name] on his cell phone and left message for call back - they are out for a walk getting fresh air. Code status addressed with palliative care team and they had wanted full code prior to his clinical decline. Patient has impending cardiac arrest at any moment and is highly critically ill.    Patient's family later arrived to bedside and son was agreeable to DNR. He is going to call additional family members.   Additional cc time 80 minutes irrespective of procedures.   Durel Salts, MD Pulmonary and Critical Care Medicine [facility] 06/12/2023 3:29 PM Pager: see AMION  If no response to pager, please call critical care on call (see AMION) until 7pm After 7:00 pm call Elink

## 2023-06-12 NOTE — Progress Notes (Signed)
NAME:  Jeff Ferrell, MRN:  [id number], DOB:  1950/10/22, LOS: 7 ADMISSION DATE:  06/05/2023, CONSULTATION DATE:  06/05/23 REFERRING MD:  Malva Cogan transfer, CHIEF COMPLAINT:  shock, acute respiratory failure   History of present illness   72 year old man who presented to [facility] as a transfer from [facility] for shock, acute respiratory failure and acute renal failure. PMHx significant for adrenal insufficiency, TIA, DVT, IDA, PUD, T2DM (c/b diabetic peripheral neuropathy/foot wounds and /;osteomyelitis requiring bilateral AKAs), low back pain 2/2 discitis s/p multilevel laminectomy, BPH.   Patient is intubated/sedated, therefore history is obtained from chart review of records from [facility]. Patient was reportedly found down at home ~0900 12/17 with dark blood coming from ostomy. Glucose borderline low at 67 on EMS arrival and patient was noted to be cold, bradycardic and stiff. Required bagging with EMS due to insufficient respiratory effort. Ultimately required intubation.R femoral central line placed. At [facility], labs were notable for WBC 35, Hgb 8.4. Na 123, CO2 6.5, BUN 31/Cr 2.88. AST 60. Ammonia 86. AG 17. Vanc/Zosyn were initiated as well as bicarbonate gtt. Levophed started for vasopressor support.   Transferred to [facility] for higher level of care.  Past Medical History  Adrenal insufficiency Diabetic OM s/p AKA bilateral IDA, PUD BPH Alcohol use disorder  Significant Hospital Events   12/17 - Presented to [facility] as a transfer from [facility] for shock, ARF. Intubated, on pressors. PCCM consulted for admission.  12/18- bedside EGD with large oozing ulcer, no varices. Hemostatic gel placed and ulcer base biopsied. CT head negative 12/19- echo with EF 30-35%, apical hypokinesis 12/20 failed SBT for apnea 12/22 arterial doppler shows left hand ischemia  Interim history/subjective:  Overnight Central line stopped drawing back but still infused.  Back on norepi. Having blood in oropharynx,    Objective   Blood pressure 96/62, pulse (!) 106, temperature 98.5 F (36.9 C), temperature source Axillary, resp. rate (!) 24, height 5\' 9"  (1.753 m), weight 51.4 kg, SpO2 99%.    Vent Mode: PRVC FiO2 (%):  [30 %] 30 % Set Rate:  [24 bmp] 24 bmp Vt Set:  [500 mL] 500 mL PEEP:  [5 cmH20] 5 cmH20 Plateau Pressure:  [19 cmH20-21 cmH20] 21 cmH20   Intake/Output Summary (Last 24 hours) at 06/12/2023 1027 Last data filed at 06/12/2023 0700 Gross per 24 hour  Intake 1648.03 ml  Output 645 ml  Net 1003.03 ml   Filed Weights   06/10/23 0212 06/11/23 0231 06/12/23 0500  Weight: 51.4 kg 51.4 kg 51.4 kg    Examination: Gen:      Intubated, sedated, acutely ill appearing HEENT:  ETT to vent, blood in oropharynx Lungs:    sounds of mechanical ventilation auscultated no wheeze CV:         tachycardic, regular Abd:      + bowel sounds; soft, non-tender; no palpable masses, no distension Ext:    Bilateral AKA, left hand fingers dusky, radial pulse not palpable Skin:      Warm and dry; no rashes Neuro:   sedated, RASS -1, follows commands    Labs and imaging reviewed: CBGs>200 BMET pending this am CBC, INR pending    Resolved Hospital Problem list   AGMA resolved Septic shock resolved.  ESBL UTI - merrem x 5 days  Assessment & Plan:  72 y.o. year old male with PUD, reported alcohol abuse, adrenal insufficiency  Multifactorial shock - septic shock vs hemorrhagic -Trend CBC, f/u  surgical pathology -levophed with MAP goal >65 -IV PPI BID - 1 unit prbc, repeat H&H pending  ABLA secondary to UGI Bleed Gastric ulceration Large gastric ulcer with visible vessel seen on EGD 12/18, ulcer concerning for malignancy and biopsy was taken.  If rebleeds GI would recommend upper endoscopy versus consulting IR for embolization. -Continue pantoprazole 40 mg IV every 6 hours -Follow-up pathology results - daily H&H - stop tube feeds, insert  OG to LWS to evaluate for bleeding.   Acute left upper extremity arterial thrombosis - discussed with GI and vascular surgery, not a candidate for intervention or anticoagulation - high concern for limb loss   Acute hypoxemic respiratory failure  - Continue full vent support (4-8cc/kg IBW) - Wean FiO2 for O2 sat > 90% - Daily WUA/SBT, passed SBT but waiting on extubation until family meeting and plans for reintubation can be elucidated - VAP bundle - Pulmonary hygiene - PAD protocol for sedation: Propofol and Fentanyl for goal RASS 0 to -1 - schedule oxycodone to limit sedation  Acute HFrEF Echo with EF of 30 to 35%.  Ventricular bigeminy -Appreciate cardiology recommendations, will need ischemic evaluation once recovers from acute illness -Remains on Levophed on and off -Consider adding beta-blocker and additional GDMT once able -Ischemic evaluation pending stabilization   Non oliguric AKI/ARF in the setting of sepsis/ATN NAGMA Hypokalemia Hypophosphatemia - daily bmet - replete electrolytes - monitor UOP - may benefit from diuresis  Anisocoria Hx of TIA - head ct 12/18 unremarkable.  T2DM - CBGs elevated today - continue levemir 5 u - increase SSI  Thrombocytopenia, reactive -trend CBC  Peripheral neuropathy s/p bilateral AKA Multiple skin wounds including sacral decubitus ulcer, POA -Wound care consult  GOC - family meeting today  Best practice:  Diet: NPO, tube feeds Pain/Anxiety/Delirium protocol (if indicated): Fentanyl prn, precedex, scheduled oxycodone DVT prophylaxis: none GI prophylaxis: PPI Glucose control: SSI, sensitive Code Status: full code Family Communication: family meeting today - time pending.   The patient is critically ill due to respiratory failure, shock.  Critical care was necessary to treat or prevent imminent or life-threatening deterioration.  Critical care was time spent personally by me on the following activities: development  of treatment plan with patient and/or surrogate as well as nursing, discussions with consultants, evaluation of patient's response to treatment, examination of patient, obtaining history from patient or surrogate, ordering and performing treatments and interventions, ordering and review of laboratory studies, ordering and review of radiographic studies, pulse oximetry, re-evaluation of patient's condition and participation in multidisciplinary rounds.   Critical Care Time devoted to patient care services described in this note is 40 minutes. This time reflects time of care of this signee Charlott Holler . This critical care time does not reflect separately billable procedures or procedure time, teaching time or supervisory time of PA/NP/Med student/Med Resident etc but could involve care discussion time.       Charlott Holler  Pulmonary and Critical Care Medicine 06/12/2023 10:30 AM  Pager: see AMION  If no response to pager , please call critical care on call (see AMION) until 7pm After 7:00 pm call Elink

## 2023-06-12 NOTE — Progress Notes (Addendum)
eLink Physician-Brief Progress Note Patient Name: Zakary Grays DOB: 20-May-1951 MRN: [id number]   Date of Service  06/12/2023  HPI/Events of Note  Asked to modify restraint order to indicate it is only being used for right wrist.   eICU Interventions  Order modified     Intervention Category Minor Interventions: Other:  Oretha Milch 06/12/2023, 2:44 AM  Addendum at 540 am - Asked to reorder the PRN fentanyl order since patient is agitated while on precedex - order placed  Addendum at 6:55 am - Called for hypotension with SBP in 50s. This was checked in left arm. Seen on camera. Very awake and restless. BP repeated on right arm. Similar. Had already started levophed. At 10 mic/min, SBP better to 98. However while seeing him on camera, he also vomited blood. Suctioned. RNs note labs could not be done from yesterday since the central line does not draw back blood and lab is not able to draw any labs. Alteplase could not be used since the line has been in for sometime, I am told issue with drawing labs from the line is not new. I ordered IV PPI and 1 unit RBC to be transfused since labs are not able to be drawn. Called and spoke with CCM NP to evaluate for possible art line versus a line change to allow labs also to be sent. DW RN. Limited options with anatomy and left arm ischemia as well.

## 2023-06-12 NOTE — Progress Notes (Signed)
Patient more acutely decompensating. A-line was placed for more accurate measure of BP. Once placed, confirmed Bps are 50/30s. Levo was maxed out, vaso at 0.03. Increased vaso to 0.04, added neo gtt. Stat ABG drawn showing metabolic acidosis. He was given x2 amps bicarb and bicarb gtt was ordered. Additionally, 1 U PRBC ordered. Hemoglobin 8.3 but patient actively bleeding from OGT. Goals of care ongoing by attending, Dr. Durel Salts, currently full code.

## 2023-06-12 NOTE — Procedures (Signed)
Arterial Catheter Insertion Procedure Note  Ekam Derstine  [id number]  24-Sep-1950  Date:06/12/23  Time:3:06 PM    Provider Performing: Cristopher Peru    Procedure: Insertion of Arterial Line (40981) with US guidance (19147)   Indication(s) Blood pressure monitoring and/or need for frequent ABGs  Consent Unable to obtain consent due to emergent nature of procedure.  Anesthesia None   Time Out Verified patient identification, verified procedure, site/side was marked, verified correct patient position, special equipment/implants available, medications/allergies/relevant history reviewed, required imaging and test results available.   Sterile Technique Maximal sterile technique including full sterile barrier drape, hand hygiene, sterile gown, sterile gloves, mask, hair covering, sterile ultrasound probe cover (if used).   Procedure Description Area of catheter insertion was cleaned with chlorhexidine and draped in sterile fashion. With real-time ultrasound guidance an arterial catheter was placed into the right femoral artery.  Appropriate arterial tracings confirmed on monitor.     Complications/Tolerance None; patient tolerated the procedure well.   EBL Minimal   Specimen(s) None   Under supervision of Durel Salts, MD  Cristopher Peru, PA-C [facility] 06/12/23 3:07 PM  Please see [email or web address] for pager details.  From 7A-7P if no response, please call [phone] After hours, please call ELink [phone]

## 2023-06-12 NOTE — Progress Notes (Signed)
Chaplain responded to call from pt's nurse to support son who was having a hard time accepting his father's precipitous decline and having to make the DNR decision.   Unknowingly, Chaplain passed son in unit hallway as son was leaving to take a walk.  Chaplain left unit to find son, to no avail.  Chaplain went on to other calls, periodically checking back in to see if son had returned.  Apparently son did not return.   Chaplain did not visit pt or son.  Vernell Morgans Chaplain

## 2023-06-12 NOTE — Progress Notes (Signed)
Palliative:  HPI: 72 y.o. male  with past medical history of type 2 diabetes (complicated by diabetic peripheral neuropathy/foot wound/osteomyelitis), bilateral AKA's, TIA, DVT, IDA, PUD, adrenal insufficiency, low back pain secondary to discitis status post multilevel laminectomy, and BPH.  He was admitted to [facility] as a transfer from [facility] in [place] on 06/05/2023 with shock, acute respiratory failure, and acute renal failure.  He was reportedly found down at home with dark blood coming from ostomy.  On EMS arrival patient was noted to be cold, bradycardic, and stiff.  He ultimately required intubation and vasopressor support.  On 12/18, bedside EGD showed large oozing ulcer, no varices. Palliative Medicine has been consulted for goals of care and medical decision making.  I met today with Mr. Juancarlos son Kateri Mc, nephew, and sister over phone along with Dr. Celine Mans. Dr. Celine Mans had a very clear explanation explaining decline, challenges and barriers to improvement, different paths of treatment, and agressiveness of care desired. We further explored realistic expectations and poor prognosis. We discussed code status and recommendations for DNR status. We discussed potential tracheostomy. [name] is not prepared to make any decisions. He is struggling with path forward. He wants more time to think about his options.   I returned to bedside and Mr. Trexton is having acute decline. Severe hypotension requiring Levo, vasopressin, and neo infusions. HR changes concerning. Over course of attempting to stabilize Mr. Wesely continued to decline and appears to be at end of life. I revisited with [name] and Mr. Karlon nephew. I explained decline and concern that Mr. Verlin is at end of life. Discussed concern that he will have cardiac arrest and anticipated poor outcome. Highly encouraged consideration of DNR and not putting him through CPR. [name] will consider but still not ready for DNR - he wishes to walk outside for  fresh air to think. I recommended that he does no go far as I anticipate further decline.   Discussed with RN and Dr. Celine Mans as well as Mertha Baars, PA.   Exam:   Plan: - Strongly encouraging consideration of DNR status.  - Family continuing to struggle with decisions and processing poor prognosis.   65 min  Yong Channel, NP Palliative Medicine Team Pager [phone] (Please see [email or web address] for schedule) Team Phone [phone]    Greater than 50%  of this time was spent counseling and coordinating care related to the above assessment and plan

## 2023-06-12 NOTE — Progress Notes (Signed)
MD notified of low BP. CVC did not draw back enough to obtain labs. Phlebotomy tired and was also not successful. While MD was on the camera the pt started to continuously bleed from his mouth. of bright red blood is in the suction canister which was collected over . MD ordered 1u PRBC and PPI.   Antionetta Ator Angelia Mould

## 2023-06-12 NOTE — Progress Notes (Signed)
Confirmed DNR code status with CCM provider. Provider confirmed that all current IV gtts to be continued, but no ACLS meds to be administered if pt. cardiac arrests.

## 2023-06-12 NOTE — TOC CM/SW Note (Signed)
Transition of Care [facility]) - Inpatient Brief Assessment   Patient Details  Name: Jeff Ferrell MRN: [id number] Date of Birth: 1950/10/13  Transition of Care [facility]) CM/SW Contact:    Marliss Coots, LCSW Phone Number: 06/12/2023, 11:06 AM   Clinical Narrative:  11:06 AM Per chart review, patient is now intubated. TOC will continue to follow.  Transition of Care Asessment: Insurance and Status: Insurance coverage has been reviewed Patient has primary care physician: Yes     Prior/Current Home Services: No current home services Social Drivers of Health Review:  (Unable to answer) Readmission risk has been reviewed: Yes Transition of care needs:  (TOC will continue to be available as needed)

## 2023-06-12 NOTE — Progress Notes (Signed)
  Progress Note    06/12/2023 7:14 AM Hospital Day 7  Subjective:  intubated  Tm 99  Gtts:  Levophed  Vitals:   06/12/23 0615 06/12/23 0630  BP:  (!) 73/49  Pulse: 67 69  Resp: (!) 24 (!) 25  Temp:    SpO2: 100% 99%    Physical Exam: Extremities:  left fingers ischemic  CBC    Component Value Date/Time   WBC 21.1 (H) 06/11/2023 0519   RBC 2.20 (L) 06/11/2023 0519   HGB 6.9 (LL) 06/11/2023 0519   HCT 21.4 (L) 06/11/2023 0519   PLT 53 (L) 06/11/2023 0519   MCV 97.3 06/11/2023 0519   MCH 31.4 06/11/2023 0519   MCHC 32.2 06/11/2023 0519   RDW 16.0 (H) 06/11/2023 0519   LYMPHSABS 0.5 (L) 06/05/2023 2330   MONOABS 1.1 (H) 06/05/2023 2330   EOSABS 0.0 06/05/2023 2330   BASOSABS 0.0 06/05/2023 2330    BMET    Component Value Date/Time   NA 142 06/11/2023 0519   K 3.4 (L) 06/11/2023 0519   CL 116 (H) 06/11/2023 0519   CO2 18 (L) 06/11/2023 0519   GLUCOSE 146 (H) 06/11/2023 0519   BUN 38 (H) 06/11/2023 0519   CREATININE 1.34 (H) 06/11/2023 0519   CALCIUM 7.0 (L) 06/11/2023 0519   GFRNONAA 56 (L) 06/11/2023 0519   GFRAA >60 09/01/2015 1510    INR    Component Value Date/Time   INR 2.1 (H) 06/06/2023 0809     Intake/Output Summary (Last 24 hours) at 06/12/2023 0714 Last data filed at 06/12/2023 0600 Gross per 24 hour  Intake 1535.49 ml  Output 755 ml  Net 780.49 ml     Assessment/Plan:  72 y.o. male with left radial artery occlusion  Hospital Day 7  -pt remains critically ill with recent GIB and now on Levophed.  Pt with frank blood out of mouth this am.   -Endoscopy revealed that given the size and location of the ulcer, there is no endoscopic procedure or intervention that would lessen risk of bleeding and therefore very high risk for anticoagulation and therefore not a surgical candidate.  -RN did not think family meeting happened yesterday to discuss goals of care.    Doreatha Massed, PA-C [facility] [phone] 06/12/2023 7:14 AM

## 2023-06-12 NOTE — Progress Notes (Signed)
Per Elink MD Kamat camera'd at bedside okay to titrate levo for hemodynamic stability. Levo initiated at 2, titrated to 5 and then doubled to 10. MD at bedside during titrations. SBP > 90. Pt. Then began having frank blood out of mouth and ETT, see new orders.

## 2023-06-13 LAB — CBC
HCT: 17.2 % — ABNORMAL LOW (ref 39.0–52.0)
Hemoglobin: 5.6 g/dL — CL (ref 13.0–17.0)
MCH: 32.2 pg (ref 26.0–34.0)
MCHC: 32.6 g/dL (ref 30.0–36.0)
MCV: 98.9 fL (ref 80.0–100.0)
Platelets: 35 10*3/uL — ABNORMAL LOW (ref 150–400)
RBC: 1.74 MIL/uL — ABNORMAL LOW (ref 4.22–5.81)
RDW: 17.8 % — ABNORMAL HIGH (ref 11.5–15.5)
WBC: 33.5 10*3/uL — ABNORMAL HIGH (ref 4.0–10.5)
nRBC: 7.4 % — ABNORMAL HIGH (ref 0.0–0.2)

## 2023-06-13 LAB — BASIC METABOLIC PANEL
Anion gap: 31 — ABNORMAL HIGH (ref 5–15)
BUN: 49 mg/dL — ABNORMAL HIGH (ref 8–23)
CO2: 7 mmol/L — ABNORMAL LOW (ref 22–32)
Calcium: 6.8 mg/dL — ABNORMAL LOW (ref 8.9–10.3)
Chloride: 114 mmol/L — ABNORMAL HIGH (ref 98–111)
Creatinine, Ser: 2.05 mg/dL — ABNORMAL HIGH (ref 0.61–1.24)
GFR, Estimated: 34 mL/min — ABNORMAL LOW (ref >60)
Glucose, Bld: 148 mg/dL — ABNORMAL HIGH (ref 70–99)
Potassium: 5.2 mmol/L — ABNORMAL HIGH (ref 3.5–5.1)
Sodium: 152 mmol/L — ABNORMAL HIGH (ref 135–145)

## 2023-06-13 LAB — PREPARE RBC (CROSSMATCH)

## 2023-06-13 MED ORDER — SODIUM BICARBONATE 8.4 % IV SOLN
INTRAVENOUS | Status: DC
  Filled 2023-06-13: qty 1000

## 2023-06-13 MED ORDER — SODIUM BICARBONATE 8.4 % IV SOLN
100.0000 meq | INTRAVENOUS | Status: AC
  Administered 2023-06-13 (×2): 100 meq via INTRAVENOUS
  Filled 2023-06-13 (×2): qty 100

## 2023-06-13 MED ORDER — SODIUM CHLORIDE 0.9% FLUSH: 3.0000 mL | Freq: Two times a day (BID) | INTRAVENOUS | Status: DC

## 2023-06-13 MED ORDER — ACETAMINOPHEN 650 MG RE SUPP: 650.0000 mg | Freq: Four times a day (QID) | RECTAL | Status: DC | PRN

## 2023-06-13 MED ORDER — GLYCOPYRROLATE 1 MG PO TABS: 1.0000 mg | ORAL_TABLET | ORAL | Status: DC | PRN

## 2023-06-13 MED ORDER — GLYCOPYRROLATE 0.2 MG/ML IJ SOLN: 0.2000 mg | INTRAMUSCULAR | Status: DC | PRN

## 2023-06-13 MED ORDER — ACETAMINOPHEN 325 MG PO TABS: 650.0000 mg | ORAL_TABLET | Freq: Four times a day (QID) | ORAL | Status: DC | PRN

## 2023-06-13 MED ORDER — SODIUM CHLORIDE 0.9% IV SOLUTION: Freq: Once | INTRAVENOUS | Status: AC

## 2023-06-13 MED ORDER — POLYVINYL ALCOHOL 1.4 % OP SOLN
1.0000 [drp] | Freq: Four times a day (QID) | OPHTHALMIC | Status: DC | PRN
  Filled 2023-06-13: qty 15

## 2023-06-13 MED ORDER — SODIUM CHLORIDE 0.9% FLUSH: 3.0000 mL | INTRAVENOUS | Status: DC | PRN

## 2023-06-14 LAB — BPAM RBC
Blood Product Expiration Date: 202501062359
Blood Product Expiration Date: 202501112359
Blood Product Expiration Date: 202412252359
Blood Product Expiration Date: 202412252359
Blood Product Expiration Date: 202412282359
Blood Product Expiration Date: 202501062359
ISSUE DATE / TIME: 202412250115
ISSUE DATE / TIME: 202412250115
ISSUE DATE / TIME: 202412230833
ISSUE DATE / TIME: 202412240719
ISSUE DATE / TIME: 202412241510
ISSUE DATE / TIME: 202412241510
Unit Type and Rh: 7300
Unit Type and Rh: 7300
Unit Type and Rh: 7300
Unit Type and Rh: 7300
Unit Type and Rh: 9500
Unit Type and Rh: 9500

## 2023-06-14 LAB — TYPE AND SCREEN
ABO/RH(D): B POS
Antibody Screen: NEGATIVE
Unit division: 0
Unit division: 0
Unit division: 0
Unit division: 0
Unit division: 0
Unit division: 0

## 2023-06-20 NOTE — Progress Notes (Signed)
The patient is in DIC, MOF, max on four vasopressors, and continuing to bleed with large NGT bloody output. Received 2 units PRBCs at midnight, Bicarb 250 meq boluses and Bicarb drip. BP 40/17, SpO2 66% on FiO2 100%, and Afib 90s. Oliguric D/w son (Jeff Ferrell) over the phone and he agreed to withdraw care Orders are in CCM 20 min

## 2023-06-20 NOTE — Progress Notes (Signed)
MD talked to the pt son about withdrawing care. Pt son was then called prior to withdrawing and was able to video chat/talk to the pt prior to passing. Pt went asystole at 0352 surrounded by staff. RN notified son of pt's death. Son will arrive to the unit 06/07/2023 around 9am to say his goodbyes.  Quanell Loughney Angelia Mould

## 2023-06-20 NOTE — Death Summary Note (Signed)
DEATH SUMMARY   Patient Details  Name: Jeff Ferrell MRN: [id number] DOB: May 01, 1951  Admission/Discharge Information   Admit Date:  11-Jun-2023  Date of Death: Date of Death: June 19, 2023  Time of Death: Time of Death: 0352  Length of Stay: 8  Referring Physician: [name], MD   Reason(s) for Hospitalization  shock  Diagnoses  Preliminary cause of death: acute blood loss anemia, bleeding gastric ulcer Secondary Diagnoses (including complications and co-morbidities):  Principal Problem:   Shock (HCC) Active Problems:   Acute gastric ulcer with hemorrhage   Acute combined systolic and diastolic heart failure (HCC)   Ventricular bigeminy Type 2 DM with severe peripheral vascular disease  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 year old male who presented to [facility] as a transfer from [facility] for shock, acute respiratory failure and acute renal failure. PMHx significant for adrenal insufficiency, TIA, DVT, IDA, PUD, T2DM (c/b diabetic peripheral neuropathy/foot wounds and /;osteomyelitis requiring bilateral AKAs), low back pain 2/2 discitis s/p multilevel laminectomy, BPH.   Patient was intubated/sedated, therefore history is obtained from chart review of records from [facility]. Patient was reportedly found down at home ~0900 06/11/2023 with dark blood coming from ostomy. Glucose borderline low at 67 on EMS arrival and patient was noted to be cold, bradycardic and stiff. Required bagging with EMS due to insufficient respiratory effort. Ultimately required intubation.R femoral central line placed. At [facility], labs were notable for WBC 35, Hgb 8.4. Na 123, CO2 6.5, BUN 31/Cr 2.88. AST 60. Ammonia 86. AG 17. Vanc/Zosyn were initiated as well as  bicarbonate gtt. Levophed started for vasopressor support.  He underwent endoscopy at [facility] that showed bleeding cratered gastric ulcer which was treated with hemostatic gel. His hospital course was complicated by respiratory failure, ESBL UTI, failure to wean from ventilator, encephalopathy and delirium as well as shock which failed to resolve. Family meeting was had on 12/24 to discuss goals of care. During that time patient developed large volume upper GI bleeding through OG tube, progressive multi-vasopressor shock, multi-organ failure and DIC. Family made him DNR and subsequently comfort care. He passed away with staff at bedside.     Pertinent Labs and Studies  Significant Diagnostic Studies DG Abd Portable 1V Result Date: 06/12/2023 CLINICAL DATA:  Enteric tube placement. EXAM: PORTABLE ABDOMEN - 1 VIEW COMPARISON:  Radiograph dated 06/08/2023. FINDINGS: Enteric tube with tip in the left upper abdomen likely in the body of the stomach. Bilateral pleural effusions, right greater than left. IMPRESSION: Enteric tube with tip in the body of the stomach. Electronically Signed   By: Elgie Collard M.D.   On: 06/12/2023 17:53   VAS [place] UPPER EXTREMITY ARTERIAL DUPLEX Result Date: 06/10/2023  UPPER EXTREMITY DUPLEX STUDY Patient Name:  Jeff Ferrell  Date of Exam:   06/10/2023 Medical Rec #: [id number]        Accession #:    [id number] Date of Birth: [birth year]         Patient Gender: M Patient Age:   53 years Exam Location:  [facility] Procedure:      VAS [place] UPPER EXTREMITY ARTERIAL DUPLEX Referring Phys: Violeta Gelinas Jalysa Swopes --------------------------------------------------------------------------------  Indications: Cold, darkened left hand, question ischemia.  Risk Factors:  Diabetes. Other Factors: Shock (mixed septic, hemorrhagic, and cardiogenic), acute  respiratory and renal failure. Bilateral AKAs. Limitations: Ventilation, restraints, inability to fully rotate arm  Comparison Study: No prior study on file Performing Technologist: Sherren Kerns RVS  Examination Guidelines: A complete evaluation includes B-mode imaging, spectral Doppler, color Doppler, and power Doppler as needed of all accessible portions of each vessel. Bilateral testing is considered an integral part of a complete examination. Limited examinations for reoccurring indications may be performed as noted.  Left Doppler Findings: +---------------+----------+-------------------+--------+----------------------+ Site           PSV (cm/s)Waveform           StenosisComments               +---------------+----------+-------------------+--------+----------------------+ Subclavian Dist61        biphasic                                          +---------------+----------+-------------------+--------+----------------------+ Axillary       48        biphasic                                          +---------------+----------+-------------------+--------+----------------------+ Brachial Prox  42        biphasic                                          +---------------+----------+-------------------+--------+----------------------+ Brachial Mid   61        biphasic                                          +---------------+----------+-------------------+--------+----------------------+ Brachial Dist  65        biphasic                                          +---------------+----------+-------------------+--------+----------------------+ Radial Mid     30        spiked waveform                                   +---------------+----------+-------------------+--------+----------------------+ Radial Dist                                 occludedappears to be at site                                                      of prior arterial line                                                     to distal forearm.      +---------------+----------+-------------------+--------+----------------------+ Ulnar Mid  72                                                          +---------------+----------+-------------------+--------+----------------------+ Ulnar Dist     68                                                          +---------------+----------+-------------------+--------+----------------------+ Palmar Arch    26        dampened monophasic                               +---------------+----------+-------------------+--------+----------------------+ "thumping" waveform noted in the radial fossa  Findings reported to Anders Simmonds, NP at 16:50. Summary:  Left: Obstruction noted in the radial artery. *See table(s) above for measurements and observations. Suggest Peripheral Vascular Consult. Electronically signed by Coral Else MD on 06/10/2023 at 7:21:36 PM.    Final    DG CHEST PORT 1 VIEW Result Date: 06/09/2023 CLINICAL DATA:  Pleural effusion. EXAM: PORTABLE CHEST 1 VIEW COMPARISON:  Chest radiograph dated 06/07/2023. FINDINGS: The heart size and mediastinal contours are within normal limits. There are moderate bilateral pleural effusions with associated atelectasis/airspace disease, increased on the right and unchanged on the left. An enteric tube enters the stomach and terminates below the field of view. IMPRESSION: Moderate bilateral pleural effusions with associated atelectasis/airspace disease, increased on the right and unchanged on the left. Electronically Signed   By: Romona Curls M.D.   On: 06/09/2023 10:21   DG Abd Portable 1V Result Date: 06/08/2023 CLINICAL DATA:  Feeding tube placement. EXAM: PORTABLE ABDOMEN - 1 VIEW COMPARISON:  No comparison studies available. FINDINGS: Feeding tube tip is positioned in the mid stomach. Visualized abdomen demonstrates nonspecific bowel gas pattern. Small bilateral pleural effusions evident with retrocardiac collapse/consolidation. IMPRESSION:  Feeding tube tip is positioned in the mid stomach. Electronically Signed   By: Kennith Center M.D.   On: 06/08/2023 14:58   ECHOCARDIOGRAM COMPLETE Result Date: 06/07/2023    ECHOCARDIOGRAM REPORT   Patient Name:   Jeff Ferrell Date of Exam: 06/07/2023 Medical Rec #:  [id number]       Height:       69.0 in Accession #:    [id number]      Weight:       113.5 lb Date of Birth:  08/04/1950        BSA:          1.624 m Patient Age:    72 years        BP:           127/62 mmHg Patient Gender: M               HR:           81 bpm. Exam Location:  Inpatient Procedure: 2D Echo, Color Doppler, Cardiac Doppler and Intracardiac            Opacification Agent Indications:    R94.31 Abnormal EKG  History:        Patient has prior history of Echocardiogram examinations, most  recent 08/13/2015. Risk Factors:Diabetes and ETOH.  Sonographer:    Irving Burton Senior RDCS Referring Phys: [phone] STEPHANIE M REESE IMPRESSIONS  1. Endocardial borders are challenging to assess. Hypokinesis of the anterior/anteroseptal/inferoseptal/inferior wall from base to apex. Left ventricular ejection fraction, by estimation, is 30 to 35%. The left ventricle has moderately decreased function. The left ventricle demonstrates global hypokinesis. Left ventricular diastolic parameters are consistent with Grade II diastolic dysfunction (pseudonormalization).  2. Right ventricular systolic function was not well visualized. The right ventricular size is normal.  3. The mitral valve is normal in structure. No evidence of mitral valve regurgitation.  4. The aortic valve is grossly normal. Aortic valve regurgitation is not visualized.  5. The inferior vena cava is dilated in size with <50% respiratory variability, suggesting right atrial pressure of 15 mmHg. Conclusion(s)/Recommendation(s): Echo is suggestive of prior infarct. FINDINGS  Left Ventricle: Endocardial borders are challenging to assess. Hypokinesis of the  anterior/anteroseptal/inferoseptal/inferior wall from base to apex. Left ventricular ejection fraction, by estimation, is 30 to 35%. The left ventricle has moderately decreased function. The left ventricle demonstrates global hypokinesis. Definity contrast agent was given IV to delineate the left ventricular endocardial borders. The left ventricular internal cavity size was normal in size. There is no left ventricular  hypertrophy. Left ventricular diastolic parameters are consistent with Grade II diastolic dysfunction (pseudonormalization). Right Ventricle: The right ventricular size is normal. Right ventricular systolic function was not well visualized. Left Atrium: Left atrial size was normal in size. Right Atrium: Right atrial size was normal in size. Pericardium: There is no evidence of pericardial effusion. Mitral Valve: The mitral valve is normal in structure. No evidence of mitral valve regurgitation. Tricuspid Valve: Tricuspid valve regurgitation is not demonstrated. Aortic Valve: The aortic valve is grossly normal. Aortic valve regurgitation is not visualized. Pulmonic Valve: Pulmonic valve regurgitation is not visualized. Aorta: The aortic root and ascending aorta are structurally normal, with no evidence of dilitation. Venous: The inferior vena cava is dilated in size with less than 50% respiratory variability, suggesting right atrial pressure of 15 mmHg. IAS/Shunts: The interatrial septum was not well visualized.  LEFT VENTRICLE PLAX 2D LVIDd:         3.90 cm LVIDs:         3.30 cm LV PW:         0.90 cm LV IVS:        0.80 cm LVOT diam:     1.80 cm LV SV:         14 LV SV Index:   9 LVOT Area:     2.54 cm  LV Volumes (MOD) LV vol d, MOD A4C: 67.5 ml LV vol s, MOD A4C: 56.8 ml LV SV MOD A4C:     67.5 ml RIGHT VENTRICLE RV S prime:     8.05 cm/s LEFT ATRIUM             Index        RIGHT ATRIUM           Index LA diam:        2.80 cm 1.72 cm/m   RA Area:     11.30 cm LA Vol (A2C):   27.4 ml 16.87  ml/m  RA Volume:   24.00 ml  14.78 ml/m LA Vol (A4C):   46.1 ml 28.38 ml/m LA Biplane Vol: 36.0 ml 22.17 ml/m  AORTIC VALVE LVOT Vmax:   40.75 cm/s LVOT Vmean:  28.750 cm/s LVOT VTI:    0.057 m  AORTA Ao Root diam: 2.90 cm Ao Asc diam:  3.30 cm  SHUNTS Systemic VTI:  0.06 m Systemic Diam: 1.80 cm Carolan Clines Electronically signed by Carolan Clines Signature Date/Time: 06/07/2023/3:43:24 PM    Final    DG CHEST PORT 1 VIEW Result Date: 06/07/2023 CLINICAL DATA:  Pleural effusion. EXAM: PORTABLE CHEST 1 VIEW COMPARISON:  06/05/2023 FINDINGS: Endotracheal tube tip is 6 cm above the base of the carina. There is progressive atelectasis or infiltrate in both lung bases. Small left and tiny right pleural effusions again noted. Telemetry leads overlie the chest. IMPRESSION: 1. Progressive atelectasis or infiltrate in both lung bases. 2. Small left and tiny right pleural effusions. Electronically Signed   By: Kennith Center M.D.   On: 06/07/2023 09:54   CT HEAD WO CONTRAST ( ) Result Date: 06/06/2023 CLINICAL DATA:  Anisocoria, pupillary change EXAM: CT HEAD WITHOUT CONTRAST TECHNIQUE: Contiguous axial images were obtained from the base of the skull through the vertex without intravenous contrast. RADIATION DOSE REDUCTION: This exam was performed according to the departmental dose-optimization program which includes automated exposure control, adjustment of the mA and/or kV according to patient size and/or use of iterative reconstruction technique. COMPARISON:  08/24/2015 FINDINGS: Brain: No evidence of acute infarction, hemorrhage, mass, mass effect, or midline shift. No hydrocephalus or extra-axial fluid collection. Age-related cerebral volume loss. 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 Vascular: No hyperdense vessel. Skull: Negative for fracture or focal lesion. Sinuses/Orbits: Mucosal thickening in the maxillary sinuses and ethmoid air cells. Status post bilateral lens replacements. Other: The mastoid air  cells are well aerated. IMPRESSION: No acute intracranial process. Electronically Signed   By: Wiliam Ke M.D.   On: 06/06/2023 12:39   DG Chest Port 1 View Result Date: 06/06/2023 CLINICAL DATA:  History of ETT EXAM: PORTABLE CHEST 1 VIEW COMPARISON:  Chest x-ray 08/23/2015 FINDINGS: Endotracheal tube tip is 4.1 cm above the carina. Enteric tube extends below the diaphragm. There are new small bilateral pleural effusions, left greater than right. No focal lung infiltrate or pneumothorax. The cardiomediastinal silhouette is within normal limits. There are healed left-sided rib fractures. No acute fractures are seen. IMPRESSION: 1. Endotracheal tube tip is 4.1 cm above the carina. 2. New small bilateral pleural effusions, left greater than right. Electronically Signed   By: Darliss Cheney M.D.   On: 06/06/2023 00:00    Microbiology Recent Results (from the past 240 hours)  Culture, Urine (Do not remove urinary catheter, catheter placed by urology or difficult to place)     Status: Abnormal   Collection Time: 06/05/23  1:16 AM   Specimen: Urine, Catheterized  Result Value Ref Range Status   Specimen Description URINE, CATHETERIZED  Final   Special Requests   Final    NONE Performed at [facility], [address], [place], [place] [address]    Culture >=100,000 COLONIES/mL ESCHERICHIA COLI (A)  Final   Report Status 06/09/2023 FINAL  Final   Organism ID, Bacteria ESCHERICHIA COLI (A)  Final      Susceptibility   Escherichia coli - MIC*    AMPICILLIN >=32 RESISTANT Resistant     CEFAZOLIN >=64 RESISTANT Resistant     CEFEPIME 0.5 SENSITIVE Sensitive     CEFTRIAXONE >=64 RESISTANT Resistant     CIPROFLOXACIN >=4 RESISTANT Resistant     GENTAMICIN <=1 SENSITIVE Sensitive     IMIPENEM <=0.25 SENSITIVE Sensitive     NITROFURANTOIN <=16 SENSITIVE Sensitive     TRIMETH/SULFA <=20 SENSITIVE Sensitive  AMPICILLIN/SULBACTAM >=32 RESISTANT Resistant     PIP/TAZO 32 INTERMEDIATE  Intermediate ug/mL    * >=100,000 COLONIES/mL ESCHERICHIA COLI  MRSA Next Gen by PCR, Nasal     Status: None   Collection Time: 06/05/23 11:11 PM   Specimen: Nasal Mucosa; Nasal Swab  Result Value Ref Range Status   MRSA by PCR Next Gen NOT DETECTED NOT DETECTED Final    Comment: (NOTE) The GeneXpert MRSA Assay (FDA approved for NASAL specimens only), is one component of a comprehensive MRSA colonization surveillance program. It is not intended to diagnose MRSA infection nor to guide or monitor treatment for MRSA infections. Test performance is not FDA approved in patients less than 54 years old. Performed at [facility], [address] [place], [place] [address]   Culture, blood (Routine X 2) w Reflex to ID Panel     Status: None   Collection Time: 06/06/23  9:07 AM   Specimen: BLOOD LEFT HAND  Result Value Ref Range Status   Specimen Description BLOOD LEFT HAND  Final   Special Requests   Final    AEROBIC BOTTLE ONLY Blood Culture results may not be optimal due to an inadequate volume of blood received in culture bottles   Culture   Final    NO GROWTH 5 DAYS Performed at [facility], [address] [place], [place] [address]    Report Status 06/11/2023 FINAL  Final  Culture, blood (Routine X 2) w Reflex to ID Panel     Status: None   Collection Time: 06/06/23  9:07 AM   Specimen: BLOOD RIGHT HAND  Result Value Ref Range Status   Specimen Description BLOOD RIGHT HAND  Final   Special Requests   Final    AEROBIC BOTTLE ONLY Blood Culture results may not be optimal due to an inadequate volume of blood received in culture bottles   Culture   Final    NO GROWTH 5 DAYS Performed at [facility], [address], [place], [place] [address]    Report Status 06/11/2023 FINAL  Final    Lab Basic Metabolic Panel: Recent Labs  Lab 06/09/23 0321 06/09/23 1655 06/10/23 0321 06/11/23 0046 06/11/23 0519 06/12/23 1011 06/12/23 1445 06/12/23 1451  06/12/23 2354  NA 128*  --  137 144 142 143 147*  --  152*  K 2.9*  --  5.9* 3.5 3.4* 4.5 4.3  --  5.2*  CL 105  --  110  --  116* 118*  --   --  114*  CO2 18*  --  15*  --  18* 17*  --   --  7*  GLUCOSE 232*  --  143*  --  146* 289*  --   --  148*  BUN 27*  --  35*  --  38* 51*  --   --  49*  CREATININE 1.35*  --  1.42*  --  1.34* 1.41*  --   --  2.05*  CALCIUM 7.2*  --  7.3*  --  7.0* 7.0*  --   --  6.8*  MG 2.1 1.9 2.1  --  1.7 2.3  --   --   --   PHOS 1.2* 3.2 2.3*  --   --  3.6  --  5.2*  --    Liver Function Tests: Recent Labs  Lab 06/12/23 1451  AST 61*  ALT 39  ALKPHOS 105  BILITOT 2.5*  PROT 3.8*  ALBUMIN <  1.5*   No results for input(s): "LIPASE", "AMYLASE" in the last 168 hours. No results for input(s): "AMMONIA" in the last 168 hours. CBC: Recent Labs  Lab 06/09/23 0321 06/10/23 0321 06/11/23 0046 06/11/23 0519 06/12/23 1011 06/12/23 1445 06/12/23 1453 06/12/23 2354  WBC 24.3* 29.0*  --  21.1* 21.1*  --   --  33.5*  HGB 7.5* 9.4* 8.2* 6.9* 9.4* 7.5*  8.3*  --  5.6*  HCT 21.5* 29.4* 24.0* 21.4* 28.4* 22.0*  24.8*  --  17.2*  MCV 91.1 100.0  --  97.3 96.3  --   --  98.9  PLT 80* 60*  --  53* 79*  --  63* 35*   Cardiac Enzymes: No results for input(s): "CKTOTAL", "CKMB", "CKMBINDEX", "TROPONINI" in the last 168 hours. Sepsis Labs: Recent Labs  Lab 06/07/23 0641 06/07/23 1157 06/07/23 1654 06/08/23 1133 06/09/23 0321 06/10/23 0321 06/11/23 0519 06/12/23 1011 06/12/23 1451 06/12/23 2354  WBC  --   --    < >  --    < > 29.0* 21.1* 21.1*  --  33.5*  LATICACIDVEN 3.4* 2.8*  --  2.6*  --   --   --   --  >9.0*  --    < > = values in this interval not displayed.

## 2023-06-20 NOTE — Procedures (Signed)
Extubation Procedure Note  Patient Details:   Name: Jeff Ferrell DOB: May 10, 1951 MRN: [id number]   Airway Documentation:    Vent end date: 06/08/2023 Vent end time: 0337   Evaluation  O2 sats: transiently fell during during procedure Complications: Complications of agonal and respirations  and insufficient ventilation Patient did not tolerate procedure well. Bilateral Breath Sounds: Diminished, Rhonchi   No  Pt terminally extubated per order.    Neosha Switalski R Tre Sanker 06/12/2023, 3:56 AM

## 2023-06-20 DEATH — deceased
